# Patient Record
Sex: Male | Born: 1965 | Race: Black or African American | Hispanic: No | Smoking: Former smoker
Health system: Southern US, Community
[De-identification: ages and names within clinical notes are randomized; demographics above are authoritative.]

## PROBLEM LIST (undated history)

## (undated) DIAGNOSIS — J449 Chronic obstructive pulmonary disease, unspecified: Secondary | ICD-10-CM

## (undated) DIAGNOSIS — R569 Unspecified convulsions: Secondary | ICD-10-CM

## (undated) DIAGNOSIS — J189 Pneumonia, unspecified organism: Secondary | ICD-10-CM

## (undated) DIAGNOSIS — K859 Acute pancreatitis without necrosis or infection, unspecified: Secondary | ICD-10-CM

## (undated) DIAGNOSIS — F1290 Cannabis use, unspecified, uncomplicated: Secondary | ICD-10-CM

## (undated) DIAGNOSIS — K861 Other chronic pancreatitis: Secondary | ICD-10-CM

## (undated) DIAGNOSIS — H44002 Unspecified purulent endophthalmitis, left eye: Secondary | ICD-10-CM

## (undated) DIAGNOSIS — F1490 Cocaine use, unspecified, uncomplicated: Secondary | ICD-10-CM

## (undated) DIAGNOSIS — I509 Heart failure, unspecified: Secondary | ICD-10-CM

## (undated) DIAGNOSIS — J849 Interstitial pulmonary disease, unspecified: Secondary | ICD-10-CM

## (undated) DIAGNOSIS — E785 Hyperlipidemia, unspecified: Secondary | ICD-10-CM

## (undated) DIAGNOSIS — K219 Gastro-esophageal reflux disease without esophagitis: Secondary | ICD-10-CM

## (undated) DIAGNOSIS — F4310 Post-traumatic stress disorder, unspecified: Secondary | ICD-10-CM

## (undated) DIAGNOSIS — F17210 Nicotine dependence, cigarettes, uncomplicated: Secondary | ICD-10-CM

## (undated) DIAGNOSIS — N183 Chronic kidney disease, stage 3 unspecified: Secondary | ICD-10-CM

## (undated) DIAGNOSIS — R0600 Dyspnea, unspecified: Secondary | ICD-10-CM

## (undated) DIAGNOSIS — B9789 Other viral agents as the cause of diseases classified elsewhere: Secondary | ICD-10-CM

## (undated) DIAGNOSIS — E119 Type 2 diabetes mellitus without complications: Secondary | ICD-10-CM

## (undated) DIAGNOSIS — G8929 Other chronic pain: Secondary | ICD-10-CM

## (undated) DIAGNOSIS — I10 Essential (primary) hypertension: Secondary | ICD-10-CM

## (undated) DIAGNOSIS — C3492 Malignant neoplasm of unspecified part of left bronchus or lung: Secondary | ICD-10-CM

## (undated) DIAGNOSIS — F1090 Alcohol use, unspecified, uncomplicated: Secondary | ICD-10-CM

## (undated) DIAGNOSIS — M1990 Unspecified osteoarthritis, unspecified site: Secondary | ICD-10-CM

## (undated) DIAGNOSIS — M25559 Pain in unspecified hip: Secondary | ICD-10-CM

## (undated) DIAGNOSIS — N529 Male erectile dysfunction, unspecified: Secondary | ICD-10-CM

## (undated) DIAGNOSIS — I700 Atherosclerosis of aorta: Secondary | ICD-10-CM

## (undated) DIAGNOSIS — F32A Depression, unspecified: Secondary | ICD-10-CM

## (undated) HISTORY — PX: OTHER SURGICAL HISTORY: SHX169

## (undated) HISTORY — DX: Post-traumatic stress disorder, unspecified: F43.10

## (undated) HISTORY — DX: Chronic kidney disease, stage 3 (moderate): N18.3

## (undated) HISTORY — DX: Hyperlipidemia, unspecified: E78.5

## (undated) HISTORY — DX: Unspecified purulent endophthalmitis, left eye: H44.002

## (undated) HISTORY — DX: Chronic kidney disease, stage 3 unspecified: N18.30

## (undated) HISTORY — DX: Gastro-esophageal reflux disease without esophagitis: K21.9

## (undated) HISTORY — DX: Unspecified osteoarthritis, unspecified site: M19.90

## (undated) HISTORY — DX: Other viral agents as the cause of diseases classified elsewhere: B97.89

---

## 2007-07-21 ENCOUNTER — Emergency Department (HOSPITAL_COMMUNITY): Admission: EM | Admit: 2007-07-21 | Discharge: 2007-07-22 | Payer: Self-pay | Admitting: Emergency Medicine

## 2007-07-24 ENCOUNTER — Emergency Department (HOSPITAL_COMMUNITY): Admission: EM | Admit: 2007-07-24 | Discharge: 2007-07-24 | Payer: Self-pay | Admitting: Emergency Medicine

## 2009-06-12 DIAGNOSIS — F4310 Post-traumatic stress disorder, unspecified: Secondary | ICD-10-CM | POA: Insufficient documentation

## 2012-08-01 DIAGNOSIS — M1611 Unilateral primary osteoarthritis, right hip: Secondary | ICD-10-CM | POA: Insufficient documentation

## 2012-08-08 DIAGNOSIS — M87059 Idiopathic aseptic necrosis of unspecified femur: Secondary | ICD-10-CM | POA: Insufficient documentation

## 2012-11-29 DIAGNOSIS — K863 Pseudocyst of pancreas: Secondary | ICD-10-CM | POA: Insufficient documentation

## 2014-10-05 DIAGNOSIS — F1210 Cannabis abuse, uncomplicated: Secondary | ICD-10-CM | POA: Insufficient documentation

## 2014-10-05 DIAGNOSIS — F39 Unspecified mood [affective] disorder: Secondary | ICD-10-CM | POA: Insufficient documentation

## 2014-10-05 DIAGNOSIS — F17210 Nicotine dependence, cigarettes, uncomplicated: Secondary | ICD-10-CM | POA: Insufficient documentation

## 2014-11-23 DIAGNOSIS — Z9114 Patient's other noncompliance with medication regimen: Secondary | ICD-10-CM | POA: Insufficient documentation

## 2015-02-15 DIAGNOSIS — I152 Hypertension secondary to endocrine disorders: Secondary | ICD-10-CM | POA: Insufficient documentation

## 2015-02-15 DIAGNOSIS — I10 Essential (primary) hypertension: Secondary | ICD-10-CM | POA: Insufficient documentation

## 2015-03-04 DIAGNOSIS — F32A Depression, unspecified: Secondary | ICD-10-CM | POA: Insufficient documentation

## 2015-03-04 DIAGNOSIS — F329 Major depressive disorder, single episode, unspecified: Secondary | ICD-10-CM | POA: Insufficient documentation

## 2015-03-06 DIAGNOSIS — G8921 Chronic pain due to trauma: Secondary | ICD-10-CM | POA: Insufficient documentation

## 2015-08-28 DIAGNOSIS — F5105 Insomnia due to other mental disorder: Secondary | ICD-10-CM | POA: Insufficient documentation

## 2016-03-09 DIAGNOSIS — H0520 Unspecified exophthalmos: Secondary | ICD-10-CM | POA: Insufficient documentation

## 2016-03-09 DIAGNOSIS — D649 Anemia, unspecified: Secondary | ICD-10-CM | POA: Insufficient documentation

## 2016-04-29 ENCOUNTER — Inpatient Hospital Stay
Admission: EM | Admit: 2016-04-29 | Discharge: 2016-04-30 | DRG: 439 | Disposition: A | Discharge status: Home / Self Care | Payer: Medicaid Other | Attending: Specialist | Admitting: Specialist

## 2016-04-29 ENCOUNTER — Emergency Department: Payer: Medicaid Other

## 2016-04-29 ENCOUNTER — Encounter: Payer: Self-pay | Admitting: Emergency Medicine

## 2016-04-29 DIAGNOSIS — F17210 Nicotine dependence, cigarettes, uncomplicated: Secondary | ICD-10-CM | POA: Diagnosis present

## 2016-04-29 DIAGNOSIS — K852 Alcohol induced acute pancreatitis without necrosis or infection: Secondary | ICD-10-CM | POA: Diagnosis present

## 2016-04-29 DIAGNOSIS — I10 Essential (primary) hypertension: Secondary | ICD-10-CM | POA: Diagnosis present

## 2016-04-29 DIAGNOSIS — F1010 Alcohol abuse, uncomplicated: Secondary | ICD-10-CM | POA: Diagnosis present

## 2016-04-29 DIAGNOSIS — K219 Gastro-esophageal reflux disease without esophagitis: Secondary | ICD-10-CM | POA: Diagnosis present

## 2016-04-29 DIAGNOSIS — E860 Dehydration: Secondary | ICD-10-CM | POA: Diagnosis present

## 2016-04-29 DIAGNOSIS — K861 Other chronic pancreatitis: Secondary | ICD-10-CM | POA: Diagnosis present

## 2016-04-29 DIAGNOSIS — K859 Acute pancreatitis without necrosis or infection, unspecified: Secondary | ICD-10-CM | POA: Diagnosis present

## 2016-04-29 DIAGNOSIS — F439 Reaction to severe stress, unspecified: Secondary | ICD-10-CM | POA: Diagnosis present

## 2016-04-29 DIAGNOSIS — Z9889 Other specified postprocedural states: Secondary | ICD-10-CM

## 2016-04-29 DIAGNOSIS — F329 Major depressive disorder, single episode, unspecified: Secondary | ICD-10-CM | POA: Diagnosis present

## 2016-04-29 DIAGNOSIS — Z888 Allergy status to other drugs, medicaments and biological substances status: Secondary | ICD-10-CM

## 2016-04-29 DIAGNOSIS — Z833 Family history of diabetes mellitus: Secondary | ICD-10-CM

## 2016-04-29 DIAGNOSIS — E871 Hypo-osmolality and hyponatremia: Secondary | ICD-10-CM | POA: Diagnosis present

## 2016-04-29 HISTORY — DX: Other chronic pain: G89.29

## 2016-04-29 HISTORY — DX: Other chronic pancreatitis: K86.1

## 2016-04-29 HISTORY — DX: Essential (primary) hypertension: I10

## 2016-04-29 HISTORY — DX: Acute pancreatitis without necrosis or infection, unspecified: K85.90

## 2016-04-29 HISTORY — DX: Pain in unspecified hip: M25.559

## 2016-04-29 LAB — URINALYSIS COMPLETE WITH MICROSCOPIC (ARMC ONLY)
BACTERIA UA: NONE SEEN
BILIRUBIN URINE: NEGATIVE
GLUCOSE, UA: 50 mg/dL — AB
Hgb urine dipstick: NEGATIVE
LEUKOCYTES UA: NEGATIVE
NITRITE: NEGATIVE
Protein, ur: 30 mg/dL — AB
SPECIFIC GRAVITY, URINE: 1.014 (ref 1.005–1.030)
pH: 7 (ref 5.0–8.0)

## 2016-04-29 LAB — COMPREHENSIVE METABOLIC PANEL
ALT: 15 U/L — ABNORMAL LOW (ref 17–63)
ANION GAP: 7 (ref 5–15)
AST: 20 U/L (ref 15–41)
Albumin: 3.6 g/dL (ref 3.5–5.0)
Alkaline Phosphatase: 81 U/L (ref 38–126)
BILIRUBIN TOTAL: 0.2 mg/dL — AB (ref 0.3–1.2)
BUN: 13 mg/dL (ref 6–20)
CHLORIDE: 101 mmol/L (ref 101–111)
CO2: 23 mmol/L (ref 22–32)
Calcium: 8.8 mg/dL — ABNORMAL LOW (ref 8.9–10.3)
Creatinine, Ser: 1.1 mg/dL (ref 0.61–1.24)
GFR calc Af Amer: >60 mL/min (ref >60)
Glucose, Bld: 151 mg/dL — ABNORMAL HIGH (ref 65–99)
POTASSIUM: 3.6 mmol/L (ref 3.5–5.1)
Sodium: 131 mmol/L — ABNORMAL LOW (ref 135–145)
TOTAL PROTEIN: 10.4 g/dL — AB (ref 6.5–8.1)

## 2016-04-29 LAB — CBC
HEMATOCRIT: 42.2 % (ref 40.0–52.0)
Hemoglobin: 14.9 g/dL (ref 13.0–18.0)
MCH: 32.4 pg (ref 26.0–34.0)
MCHC: 35.4 g/dL (ref 32.0–36.0)
MCV: 91.4 fL (ref 80.0–100.0)
Platelets: 216 10*3/uL (ref 150–440)
RBC: 4.62 MIL/uL (ref 4.40–5.90)
RDW: 14.1 % (ref 11.5–14.5)
WBC: 16.3 10*3/uL — ABNORMAL HIGH (ref 3.8–10.6)

## 2016-04-29 LAB — LIPASE, BLOOD: LIPASE: 240 U/L — AB (ref 11–51)

## 2016-04-29 LAB — TROPONIN I

## 2016-04-29 MED ORDER — AMLODIPINE BESYLATE 10 MG PO TABS
10.0000 mg | ORAL_TABLET | Freq: Every day | ORAL | Status: DC
  Administered 2016-04-29 – 2016-04-30 (×2): 10 mg via ORAL
  Filled 2016-04-29 (×2): qty 1

## 2016-04-29 MED ORDER — FERROUS SULFATE 325 (65 FE) MG PO TABS
325.0000 mg | ORAL_TABLET | Freq: Every day | ORAL | Status: DC
  Administered 2016-04-30: 325 mg via ORAL
  Filled 2016-04-29: qty 1

## 2016-04-29 MED ORDER — BUPROPION HCL ER (XL) 150 MG PO TB24
150.0000 mg | ORAL_TABLET | Freq: Every day | ORAL | Status: DC
  Administered 2016-04-29 – 2016-04-30 (×2): 150 mg via ORAL
  Filled 2016-04-29 (×2): qty 1

## 2016-04-29 MED ORDER — LORATADINE 10 MG PO TABS
10.0000 mg | ORAL_TABLET | Freq: Every day | ORAL | Status: DC
  Administered 2016-04-29 – 2016-04-30 (×2): 10 mg via ORAL
  Filled 2016-04-29 (×2): qty 1

## 2016-04-29 MED ORDER — ACETAMINOPHEN 325 MG PO TABS: 650.0000 mg | ORAL_TABLET | Freq: Four times a day (QID) | ORAL | Status: DC | PRN

## 2016-04-29 MED ORDER — BISACODYL 5 MG PO TBEC
5.0000 mg | DELAYED_RELEASE_TABLET | Freq: Every day | ORAL | Status: DC | PRN
  Filled 2016-04-29: qty 1

## 2016-04-29 MED ORDER — HYDROMORPHONE HCL 1 MG/ML IJ SOLN
1.0000 mg | Freq: Once | INTRAMUSCULAR | Status: AC
  Administered 2016-04-29: 1 mg via INTRAVENOUS

## 2016-04-29 MED ORDER — ONDANSETRON HCL 4 MG/2ML IJ SOLN
INTRAMUSCULAR | Status: AC
  Administered 2016-04-29: 4 mg via INTRAVENOUS
  Filled 2016-04-29: qty 2

## 2016-04-29 MED ORDER — HYDRALAZINE HCL 20 MG/ML IJ SOLN: 10.0000 mg | Freq: Four times a day (QID) | INTRAMUSCULAR | Status: DC | PRN

## 2016-04-29 MED ORDER — SODIUM CHLORIDE 0.9 % IV BOLUS (SEPSIS)
1000.0000 mL | Freq: Once | INTRAVENOUS | Status: AC
  Administered 2016-04-29: 1000 mL via INTRAVENOUS

## 2016-04-29 MED ORDER — ONDANSETRON HCL 4 MG/2ML IJ SOLN: 4.0000 mg | Freq: Four times a day (QID) | INTRAMUSCULAR | Status: DC | PRN

## 2016-04-29 MED ORDER — HYDROMORPHONE HCL 1 MG/ML IJ SOLN
INTRAMUSCULAR | Status: AC
  Administered 2016-04-29: 1 mg via INTRAVENOUS
  Filled 2016-04-29: qty 1

## 2016-04-29 MED ORDER — POTASSIUM CHLORIDE IN NACL 20-0.9 MEQ/L-% IV SOLN
INTRAVENOUS | Status: DC
  Administered 2016-04-29 – 2016-04-30 (×3): via INTRAVENOUS
  Filled 2016-04-29 (×6): qty 1000

## 2016-04-29 MED ORDER — FAMOTIDINE 20 MG PO TABS
20.0000 mg | ORAL_TABLET | Freq: Every day | ORAL | Status: DC
  Administered 2016-04-29 – 2016-04-30 (×2): 20 mg via ORAL
  Filled 2016-04-29 (×2): qty 1

## 2016-04-29 MED ORDER — ALBUTEROL SULFATE (2.5 MG/3ML) 0.083% IN NEBU: 2.5000 mg | INHALATION_SOLUTION | RESPIRATORY_TRACT | Status: DC | PRN

## 2016-04-29 MED ORDER — LOSARTAN POTASSIUM 50 MG PO TABS
100.0000 mg | ORAL_TABLET | Freq: Every day | ORAL | Status: DC
  Administered 2016-04-29 – 2016-04-30 (×2): 100 mg via ORAL
  Filled 2016-04-29 (×2): qty 2

## 2016-04-29 MED ORDER — ONDANSETRON HCL 4 MG PO TABS: 4.0000 mg | ORAL_TABLET | Freq: Four times a day (QID) | ORAL | Status: DC | PRN

## 2016-04-29 MED ORDER — IOPAMIDOL (ISOVUE-300) INJECTION 61%
100.0000 mL | Freq: Once | INTRAVENOUS | Status: AC | PRN
  Administered 2016-04-29: 100 mL via INTRAVENOUS

## 2016-04-29 MED ORDER — MORPHINE SULFATE (PF) 4 MG/ML IV SOLN: 4.0000 mg | INTRAVENOUS | Status: DC | PRN

## 2016-04-29 MED ORDER — ONDANSETRON HCL 4 MG/2ML IJ SOLN
4.0000 mg | Freq: Once | INTRAMUSCULAR | Status: AC
Start: 2016-04-29 — End: 2016-04-29
  Administered 2016-04-29: 4 mg via INTRAVENOUS

## 2016-04-29 MED ORDER — MORPHINE SULFATE (PF) 4 MG/ML IV SOLN
INTRAVENOUS | Status: AC
  Administered 2016-04-29: 4 mg via INTRAVENOUS
  Filled 2016-04-29: qty 1

## 2016-04-29 MED ORDER — POLYETHYLENE GLYCOL 3350 17 G PO PACK: 17.0000 g | PACK | Freq: Every day | ORAL | Status: DC | PRN

## 2016-04-29 MED ORDER — HYDROCODONE-ACETAMINOPHEN 5-325 MG PO TABS
1.0000 | ORAL_TABLET | ORAL | Status: DC | PRN
  Administered 2016-04-29: 2 via ORAL
  Administered 2016-04-29: 1 via ORAL
  Administered 2016-04-30 (×2): 2 via ORAL
  Filled 2016-04-29 (×2): qty 2
  Filled 2016-04-29: qty 1
  Filled 2016-04-29: qty 2

## 2016-04-29 MED ORDER — ENOXAPARIN SODIUM 40 MG/0.4ML ~~LOC~~ SOLN
40.0000 mg | SUBCUTANEOUS | Status: DC
  Administered 2016-04-29 – 2016-04-30 (×2): 40 mg via SUBCUTANEOUS
  Filled 2016-04-29 (×2): qty 0.4

## 2016-04-29 MED ORDER — MORPHINE SULFATE (PF) 4 MG/ML IV SOLN
4.0000 mg | Freq: Once | INTRAVENOUS | Status: AC
Start: 2016-04-29 — End: 2016-04-29
  Administered 2016-04-29: 4 mg via INTRAVENOUS

## 2016-04-29 MED ORDER — IOPAMIDOL (ISOVUE-300) INJECTION 61%
30.0000 mL | Freq: Once | INTRAVENOUS | Status: AC | PRN
  Administered 2016-04-29: 30 mL via ORAL

## 2016-04-29 MED ORDER — ACETAMINOPHEN 650 MG RE SUPP: 650.0000 mg | Freq: Four times a day (QID) | RECTAL | Status: DC | PRN

## 2016-04-29 NOTE — ED Triage Notes (Signed)
Pt to ED via EMS with LLQ abdominal pain that started 2 days ago, but got worse last night, accompanied by nausea and vomiting, approx 6 times in the last 24hrs. Pt reports history of pancreatitis with stent placement in past.

## 2016-04-29 NOTE — ED Provider Notes (Signed)
[facility] Emergency Department Provider Note  Time seen: 7:21 AM  I have reviewed the triage vital signs and the nursing notes.   HISTORY  Chief Complaint Abdominal Pain; Nausea; and Emesis    HPI Noah Velasquez is a 50 y.o. male with a past medical history of alcohol abuse, pancreatitis, hypertension, who presents the emergency department with left-sided abdominal pain for the past 2-3 days. According to the patient for the past several days he has developed progressively worsening left-sided abdominal pain. He states loose stool for the same time period, nausea with vomiting this morning. Denies fever. Patient has a history of alcohol-induced pancreatitis, states he has not been drinking for quite a while however restarted 2 weeks ago daily drinking. His last drink was last night. Denies dysuria.  Past Medical History:  Diagnosis Date  . Chronic hip pain   . Hypertension   . Pancreatitis     There are no active problems to display for this patient.   Past Surgical History:  Procedure Laterality Date  . pancreatic stent placement      Prior to Admission medications   Not on File    Allergies  Allergen Reactions  . Hctz [Hydrochlorothiazide] Swelling    No family history on file.  Social History Social History  Substance Use Topics  . Smoking status: Current Every Day Smoker    Packs/day: 0.50    Types: Cigarettes  . Smokeless tobacco: Never Used  . Alcohol use Yes     Comment: usually none, but started back drinking 12 pack last week    Review of Systems Constitutional: Negative for fever Cardiovascular: Negative for chest pain. Respiratory: Negative for shortness of breath. Gastrointestinal: Left-sided abdominal pain. Genitourinary: Negative for dysuria. Musculoskeletal: Negative for back pain Neurological: Negative for headache 10-point ROS otherwise negative.  ____________________________________________   PHYSICAL  EXAM:  VITAL SIGNS: ED Triage Vitals  Enc Vitals Group     BP 04/29/16 0712 (!) 179/109     Pulse Rate 04/29/16 0712 100     Resp 04/29/16 0712 (!) 22     Temp 04/29/16 0712 97.8 F (36.6 C)     Temp Source 04/29/16 0712 Oral     SpO2 04/29/16 0708 96 %     Weight 04/29/16 0713 184 lb (83.5 kg)     Height 04/29/16 0713 5\' 8"  (1.727 m)     Head Circumference --      Peak Flow --      Pain Score 04/29/16 0713 10     Pain Loc --      Pain Edu? --      Excl. in [place]? --     Constitutional: Alert and oriented. Well appearing and in no distress. Eyes: Normal exam ENT   Head: Normocephalic and atraumatic   Mouth/Throat: Mucous membranes are moist. Cardiovascular: Normal rate, regular rhythm. No murmur Respiratory: Normal respiratory effort without tachypnea nor retractions. Breath sounds are clear Gastrointestinal: Soft, moderate epigastric left-sided and left lower quadrant abdominal tenderness to palpation without rebound or guarding. Musculoskeletal: Nontender with normal range of motion in all extremities.  Neurologic:  Normal speech and language. No gross focal neurologic deficits Skin:  Skin is warm, dry and intact.  Psychiatric: Mood and affect are normal.   ____________________________________________    EKG  EKG reviewed and interpreted by myself shows normal sinus rhythm at 100 bpm, narrow QRS, normal axis, normal intervals nonspecific ST changes with slight lateral ST depression, no ST elevation.  ____________________________________________    RADIOLOGY  CT consistent with acute pancreatitis  ____________________________________________   INITIAL IMPRESSION / ASSESSMENT AND PLAN / ED COURSE  Pertinent labs & imaging results that were available during my care of the patient were reviewed by me and considered in my medical decision making (see chart for details).  The patient presents with left-sided abdominal discomfort for the past 3 days. Positive  for nausea, vomiting this morning, loose stool. Moderate tenderness to palpation. We will check labs, treat discomfort and obtain a CT scan. Suspicion for pancreatitis versus diverticulitis versus colitis.  Patient's labs are resulted showing an elevated lipase of 240, likely the cause of the patient's abdominal discomfort. CT scan pending.  CT consistent with acute pancreatitis. Patient continues to have significant pain with moderate tachycardia around 110 bpm. We will admit to the hospitalist service for further treatment. ____________________________________________   FINAL CLINICAL IMPRESSION(S) / ED DIAGNOSES  Left abdominal pain Alcohol-induced pancreatitis    Harvest Dark, MD 04/29/16 1115

## 2016-04-29 NOTE — H&P (Signed)
[facility] [place] NAME: Noah Velasquez    MR#:  [id number]  DATE OF BIRTH:  1966/07/09  DATE OF ADMISSION:  04/29/2016  PRIMARY CARE PHYSICIAN: No PCP Per Patient   REQUESTING/REFERRING PHYSICIAN: Dr. Kerman Passey  CHIEF COMPLAINT:   Chief Complaint  Patient presents with  . Abdominal Pain  . Nausea  . Emesis    HISTORY OF PRESENT ILLNESS:  Noah Velasquez  is a 50 y.o. male with a known history of Hypertension, alcohol abuse, chronic pancreatitis, drained pseudocyst presents to the emergency room complaining of 2 days of nausea, vomiting and upper abdominal pain. Patient felt his symptoms were very similar to his prior pancreatitis episode. Here his lipase is elevated at 250 and CT scan showing chronic pancreatitis along with acute changes. Patient has no hematemesis or melena. Afebrile. Liver enzymes are normal. No gallstones seen on CT scan.  Patient has restarted drinking alcohol 2 weeks back.  PAST MEDICAL HISTORY:   Past Medical History:  Diagnosis Date  . Chronic hip pain   . Chronic pancreatitis ([place])   . Hypertension   . Pancreatitis     PAST SURGICAL HISTORY:   Past Surgical History:  Procedure Laterality Date  . pancreatic stent placement      SOCIAL HISTORY:   Social History  Substance Use Topics  . Smoking status: Current Every Day Smoker    Packs/day: 0.50    Types: Cigarettes  . Smokeless tobacco: Never Used  . Alcohol use Yes     Comment: usually none, but started back drinking 12 pack last week    FAMILY HISTORY:   Family History  Problem Relation Age of Onset  . Diabetes Mother     DRUG ALLERGIES:   Allergies  Allergen Reactions  . Hctz [Hydrochlorothiazide] Swelling    REVIEW OF SYSTEMS:   Review of Systems  Constitutional: Negative for chills, fever and weight loss.  HENT: Negative for hearing loss and nosebleeds.   Eyes: Negative for blurred vision, double vision and pain.   Respiratory: Negative for cough, hemoptysis, sputum production, shortness of breath and wheezing.   Cardiovascular: Negative for chest pain, palpitations, orthopnea and leg swelling.  Gastrointestinal: Positive for abdominal pain, nausea and vomiting. Negative for constipation and diarrhea.  Genitourinary: Negative for dysuria and hematuria.  Musculoskeletal: Negative for back pain, falls and myalgias.  Skin: Negative for rash.  Neurological: Negative for dizziness, tremors, sensory change, speech change, focal weakness, seizures and headaches.  Endo/Heme/Allergies: Does not bruise/bleed easily.  Psychiatric/Behavioral: Negative for depression and memory loss. The patient is not nervous/anxious.     MEDICATIONS AT HOME:   Prior to Admission medications   Not on File     VITAL SIGNS:  Blood pressure (!) 169/99, pulse (!) 109, temperature 97.8 F (36.6 C), temperature source Oral, resp. rate 13, height 5\' 8"  (1.727 m), weight 83.5 kg (184 lb), SpO2 97 %.  PHYSICAL EXAMINATION:  Physical Exam  GENERAL:  50 y.o.-year-old patient lying in the bed with no acute distress.  EYES: Pupils equal, round, reactive to light and accommodation. No scleral icterus. Extraocular muscles intact. Exophthalmus HEENT: Head atraumatic, normocephalic. Oropharynx and nasopharynx clear. No oropharyngeal erythema, dry oral mucosa  NECK:  Supple, no jugular venous distention. No thyroid enlargement, no tenderness.  LUNGS: Normal breath sounds bilaterally, no wheezing, rales, rhonchi. No use of accessory muscles of respiration.  CARDIOVASCULAR: S1, S2 normal. No murmurs, rubs, or gallops.  ABDOMEN: Soft, diffuse tenderness  with more in the left upper quadrant area, nondistended. Bowel sounds present. No organomegaly or mass.  EXTREMITIES: No pedal edema, cyanosis, or clubbing. + 2 pedal & radial pulses b/l.   NEUROLOGIC: Cranial nerves II through XII are intact. No focal Motor or sensory deficits appreciated  b/l PSYCHIATRIC: The patient is alert and oriented x 3. Good affect.  SKIN: No obvious rash, lesion, or ulcer.   LABORATORY PANEL:   CBC  Recent Labs Lab 04/29/16 0727  WBC 16.3*  HGB 14.9  HCT 42.2  PLT 216   ------------------------------------------------------------------------------------------------------------------  Chemistries   Recent Labs Lab 04/29/16 0727  NA 131*  K 3.6  CL 101  CO2 23  GLUCOSE 151*  BUN 13  CREATININE 1.10  CALCIUM 8.8*  AST 20  ALT 15*  ALKPHOS 81  BILITOT 0.2*   ------------------------------------------------------------------------------------------------------------------  Cardiac Enzymes  Recent Labs Lab 04/29/16 0727  TROPONINI <0.03   ------------------------------------------------------------------------------------------------------------------  RADIOLOGY:  Ct Abdomen Pelvis W Contrast  Result Date: 04/29/2016 CLINICAL DATA:  HX pancreatitis with pancreatic stent placed. No ca hx or surg of a/p. C/O left side abd pain with n/v x 2 days. EXAM: CT ABDOMEN AND PELVIS WITH CONTRAST TECHNIQUE: Multidetector CT imaging of the abdomen and pelvis was performed using the standard protocol following bolus administration of intravenous contrast. CONTRAST:  150mL ISOVUE-300 IOPAMIDOL (ISOVUE-300) INJECTION 61% COMPARISON:  None. FINDINGS: Lower chest: Patchy and reticular type opacity is noted in the posterior lung bases, in the lower lobes, right greater than left, which may all reflect atelectasis. Pneumonia or aspiration pneumonitis on the right is possible. Heart is normal in size. Hepatobiliary: Unremarkable liver. No gallstones or gallbladder wall thickening. No pericholecystic inflammation. No bile duct dilation. Pancreas: There are dystrophic appearing pancreatic calcifications most evident along the body and tail. There is a cystic area within the pancreatic tail that measures 20 x 9 x 11 mm. Subtle haziness is seen in the  peripancreatic fat adjacent to the tail which may be chronic or reflect mild acute inflammation. No peripancreatic fluid collections are seen. There is no evidence of pancreatic necrosis. Spleen: Unremarkable Adrenals/Urinary Tract: Abnormal appearance of both kidneys. There are multiple areas of relative decreased enhancement in both kidneys. There are no renal masses or stones. There is no hydronephrosis. There is no perinephric inflammatory change or fluid collections. The ureters are normal in course and in caliber. Bladder is unremarkable. 1 cm nodule arises from the left adrenal gland. This shows 41% relative washout between the initial and delayed postcontrast sequences, strongly indicative of an adrenal adenoma. Normal right adrenal gland. Stomach/Bowel: Stomach, small bowel and colon are unremarkable. Normal appendix is visualized. Vascular/Lymphatic: Dense atherosclerotic calcifications are noted along the abdominal aorta and its branch vessels. There is abdominal aortic ectasia, maximum AP diameter of 2.3 cm. There are prominent gastrohepatic ligament lymph nodes, largest measuring 1 cm short axis. There are scattered sub cm retroperitoneal lymph nodes. Mildly enlarged inguinal lymph nodes are noted, largest is on the right measuring 12 mm in short axis. Reproductive: Prostate is unremarkable. Other: Small umbilical hernia. Musculoskeletal: Mild degenerative changes noted of the visualized spine. No osteoblastic or osteolytic lesions. IMPRESSION: 1. There is hazy opacity in the peripancreatic fat adjacent to the pancreatic tail, which may reflect mild, uncomplicated, active pancreatitis or be a chronic finding. Pancreas shows multiple dystrophic calcifications consistent with chronic pancreatitis. A cyst, which is likely from dilation of the distal duct, arises in the pancreatic tail measuring 2 cm in greatest dimension.  2. The kidneys also have abnormal an abnormal appearance with multiple areas of  relative decreased enhancement bilaterally, but no discrete mass and no perinephric inflammation. Findings may reflect bilateral pyelonephritis. Findings could be vascular in origin. The renal arteries, however, appear widely patent. 3. Lower lobe lung base opacity, right greater than left, which may all be atelectasis. Consider pneumonia or aspiration pneumonitis if there are consistent clinical findings. 4. No other evidence of an acute abnormality. 5. Mild inguinal adenopathy, most likely reactive. 6. Aortic atherosclerosis and mild ectasia. Electronically Signed   By: Lajean Manes M.D.   On: 04/29/2016 09:45     IMPRESSION AND PLAN:   * Acute pancreatitis with history of pseudocyst and chronic pancreatitis - alcohol-induced Nothing by mouth except ice chips and medications Add oral and IV pain medications along with nausea medications Start clear liquids tomorrow if improved. Counseled to quit alcohol. IV fluids  * Uncontrolled hypertension He has not taken his medications today. Also contributing by pain. Restart home medications. Add IV hydralazine as needed.  * Mild hyponatremia likely from dehydration Monitor with IV fluids  * Leukocytosis likely from hemoconcentration and stress reaction Afebrile. No antibiotics. Repeat labs in the morning after hydration.  * DVT prophylaxis with Lovenox  All the records are reviewed and case discussed with ED provider. Management plans discussed with the patient, family and they are in agreement.  CODE STATUS: Full code  TOTAL TIME TAKING CARE OF THIS PATIENT: 45 minutes.   Hillary Bow R M.D on 04/29/2016 at 11:42 AM  Between 7am to 6pm - Pager - [phone]  After 6pm go to [email or web address] - password [email or web address] [facility]  [place] [facility]  Office  [phone]  CC: Primary care physician; No PCP Per Patient  Note: This dictation was prepared with Dragon dictation along with smaller phrase technology. Any transcriptional  errors that result from this process are unintentional.

## 2016-04-30 LAB — CBC
HEMATOCRIT: 38.5 % — AB (ref 40.0–52.0)
HEMOGLOBIN: 13.2 g/dL (ref 13.0–18.0)
MCH: 32.6 pg (ref 26.0–34.0)
MCHC: 34.4 g/dL (ref 32.0–36.0)
MCV: 94.7 fL (ref 80.0–100.0)
Platelets: 174 10*3/uL (ref 150–440)
RBC: 4.06 MIL/uL — ABNORMAL LOW (ref 4.40–5.90)
RDW: 13.9 % (ref 11.5–14.5)
WBC: 11.5 10*3/uL — ABNORMAL HIGH (ref 3.8–10.6)

## 2016-04-30 LAB — COMPREHENSIVE METABOLIC PANEL
ALBUMIN: 3.1 g/dL — AB (ref 3.5–5.0)
ALT: 13 U/L — ABNORMAL LOW (ref 17–63)
ANION GAP: 6 (ref 5–15)
AST: 20 U/L (ref 15–41)
Alkaline Phosphatase: 63 U/L (ref 38–126)
BUN: 11 mg/dL (ref 6–20)
CHLORIDE: 104 mmol/L (ref 101–111)
CO2: 24 mmol/L (ref 22–32)
Calcium: 8.5 mg/dL — ABNORMAL LOW (ref 8.9–10.3)
Creatinine, Ser: 1.14 mg/dL (ref 0.61–1.24)
GFR calc Af Amer: >60 mL/min (ref >60)
GFR calc non Af Amer: >60 mL/min (ref >60)
GLUCOSE: 75 mg/dL (ref 65–99)
Potassium: 3.8 mmol/L (ref 3.5–5.1)
SODIUM: 134 mmol/L — AB (ref 135–145)
Total Bilirubin: 0.7 mg/dL (ref 0.3–1.2)
Total Protein: 9.2 g/dL — ABNORMAL HIGH (ref 6.5–8.1)

## 2016-04-30 LAB — HEMOGLOBIN A1C
HEMOGLOBIN A1C: 6.5 % — AB (ref 4.8–5.6)
Mean Plasma Glucose: 140 mg/dL

## 2016-04-30 MED ORDER — SERTRALINE HCL 100 MG PO TABS: 100.0000 mg | ORAL_TABLET | Freq: Every day | ORAL | Status: DC

## 2016-04-30 MED ORDER — PANCRELIPASE (LIP-PROT-AMYL) 12000-38000 UNITS PO CPEP: 12000.0000 [IU] | ORAL_CAPSULE | Freq: Three times a day (TID) | ORAL | Status: DC

## 2016-04-30 MED ORDER — INFLUENZA VAC SPLIT QUAD 0.5 ML IM SUSY
0.5000 mL | PREFILLED_SYRINGE | Freq: Once | INTRAMUSCULAR | Status: AC
  Administered 2016-04-30: 0.5 mL via INTRAMUSCULAR
  Filled 2016-04-30: qty 0.5

## 2016-04-30 NOTE — Discharge Summary (Signed)
[facility] NAME: Noah Velasquez    MR#:  [id number]  DATE OF BIRTH:  Sep 26, 1965  DATE OF ADMISSION:  04/29/2016 ADMITTING PHYSICIAN: Hillary Bow, MD  DATE OF DISCHARGE: 04/30/2016  PRIMARY CARE PHYSICIAN: No PCP Per Patient    ADMISSION DIAGNOSIS:  Alcohol-induced acute pancreatitis, unspecified complication status [id number]  DISCHARGE DIAGNOSIS:  Active Problems:   Pancreatitis   SECONDARY DIAGNOSIS:   Past Medical History:  Diagnosis Date  . Chronic hip pain   . Chronic pancreatitis ([place])   . Hypertension   . Pancreatitis     HOSPITAL COURSE:   50 year old male with past medical history of chronic pancreatitis, hypertension, GERD, who presented to the hospital due to abdominal pain nausea vomiting.  1. Abdominal pain nausea vomiting-this was secondary to a mild flareup of his chronic pancreatitis. -His CT abdomen pelvis did not show any acute inflammation but did show a small pseudocyst. -Patient was treated supportively with IV fluids, pain control, antiemetics. He has clinically improved. His diet was slowly advanced from a clear liquid eventually to a low-fat diet which she is tolerating without any further abdominal pain nausea vomiting. -His flareup was probably secondary to his alcohol abuse.  2. Essential hypertension-patient will resume his losartan, Norvasc.  3. Depression-patient will resume his Zoloft, trazodone, Wellbutrin.  4. Chronic pancreatitis-patient will resume his pancreatic lipase supplements.  DISCHARGE CONDITIONS:   Stable  CONSULTS OBTAINED:    DRUG ALLERGIES:   Allergies  Allergen Reactions  . Hctz [Hydrochlorothiazide] Swelling    DISCHARGE MEDICATIONS:     Medication List    TAKE these medications   amLODipine 10 MG tablet Commonly known as:  NORVASC Take 10 mg by mouth.   buPROPion 150 MG 12 hr tablet Commonly known as:  WELLBUTRIN SR Take 300 mg by mouth daily.    ferrous sulfate 325 (65 FE) MG tablet Take 325 mg by mouth daily.   loratadine 10 MG tablet Commonly known as:  CLARITIN Take 10 mg by mouth daily.   losartan 100 MG tablet Commonly known as:  COZAAR Take 100 mg by mouth.   ranitidine 150 MG tablet Commonly known as:  ZANTAC Take 150 mg by mouth 2 (two) times daily.   sertraline 100 MG tablet Commonly known as:  ZOLOFT Take 100 mg by mouth.   traZODone 150 MG tablet Commonly known as:  DESYREL Take 1 tablet by mouth at bedtime as needed for sleep.   ZENPEP 15000 units Cpep Generic drug:  Pancrelipase (Lip-Prot-Amyl) Take 1 capsule by mouth 3 (three) times daily. With meals         DISCHARGE INSTRUCTIONS:   DIET:  Cardiac diet  DISCHARGE CONDITION:  Stable  ACTIVITY:  Activity as tolerated  OXYGEN:  Home Oxygen: No.   Oxygen Delivery: room air  DISCHARGE LOCATION:  home   If you experience worsening of your admission symptoms, develop shortness of breath, life threatening emergency, suicidal or homicidal thoughts you must seek medical attention immediately by calling 911 or calling your MD immediately  if symptoms less severe.  You Must read complete instructions/literature along with all the possible adverse reactions/side effects for all the Medicines you take and that have been prescribed to you. Take any new Medicines after you have completely understood and accpet all the possible adverse reactions/side effects.   Please note  You were cared for by a hospitalist during your hospital stay. If you have any questions about your discharge  medications or the care you received while you were in the hospital after you are discharged, you can call the unit and asked to speak with the hospitalist on call if the hospitalist that took care of you is not available. Once you are discharged, your primary care physician will handle any further medical issues. Please note that NO REFILLS for any discharge medications  will be authorized once you are discharged, as it is imperative that you return to your primary care physician (or establish a relationship with a primary care physician if you do not have one) for your aftercare needs so that they can reassess your need for medications and monitor your lab values.     Today   Abdominal pain improved. No N/V and wants to try and eat.    VITAL SIGNS:  Blood pressure 135/87, pulse 72, temperature 98.4 F (36.9 C), temperature source Oral, resp. rate 12, height 5\' 8"  (1.727 m), weight 81.6 kg (180 lb), SpO2 100 %.  I/O:    Intake/Output Summary (Last 24 hours) at 04/30/16 1430 Last data filed at 04/30/16 1335  Gross per 24 hour  Intake           3319.5 ml  Output             1700 ml  Net           1619.5 ml    PHYSICAL EXAMINATION:  GENERAL:  50 y.o.-year-old patient lying in the bed with no acute distress.  EYES: Pupils equal, round, reactive to light and accommodation. No scleral icterus. Extraocular muscles intact.  HEENT: Head atraumatic, normocephalic. Oropharynx and nasopharynx clear.  NECK:  Supple, no jugular venous distention. No thyroid enlargement, no tenderness.  LUNGS: Normal breath sounds bilaterally, no wheezing, rales,rhonchi. No use of accessory muscles of respiration.  CARDIOVASCULAR: S1, S2 normal. No murmurs, rubs, or gallops.  ABDOMEN: Soft, mildly tender in epigastric, RLQ area, non-distended. Bowel sounds present. No organomegaly or mass.  EXTREMITIES: No pedal edema, cyanosis, or clubbing.  NEUROLOGIC: Cranial nerves II through XII are intact. No focal motor or sensory defecits b/l.  PSYCHIATRIC: The patient is alert and oriented x 3. Good affect.  SKIN: No obvious rash, lesion, or ulcer.   DATA REVIEW:   CBC  Recent Labs Lab 04/30/16 0506  WBC 11.5*  HGB 13.2  HCT 38.5*  PLT 174    Chemistries   Recent Labs Lab 04/30/16 0506  NA 134*  K 3.8  CL 104  CO2 24  GLUCOSE 75  BUN 11  CREATININE 1.14   CALCIUM 8.5*  AST 20  ALT 13*  ALKPHOS 63  BILITOT 0.7    Cardiac Enzymes  Recent Labs Lab 04/29/16 0727  TROPONINI <0.03    Microbiology Results  No results found for this or any previous visit.  RADIOLOGY:  Ct Abdomen Pelvis W Contrast  Result Date: 04/29/2016 CLINICAL DATA:  HX pancreatitis with pancreatic stent placed. No ca hx or surg of a/p. C/O left side abd pain with n/v x 2 days. EXAM: CT ABDOMEN AND PELVIS WITH CONTRAST TECHNIQUE: Multidetector CT imaging of the abdomen and pelvis was performed using the standard protocol following bolus administration of intravenous contrast. CONTRAST:  187mL ISOVUE-300 IOPAMIDOL (ISOVUE-300) INJECTION 61% COMPARISON:  None. FINDINGS: Lower chest: Patchy and reticular type opacity is noted in the posterior lung bases, in the lower lobes, right greater than left, which may all reflect atelectasis. Pneumonia or aspiration pneumonitis on the right is possible. Heart is  normal in size. Hepatobiliary: Unremarkable liver. No gallstones or gallbladder wall thickening. No pericholecystic inflammation. No bile duct dilation. Pancreas: There are dystrophic appearing pancreatic calcifications most evident along the body and tail. There is a cystic area within the pancreatic tail that measures 20 x 9 x 11 mm. Subtle haziness is seen in the peripancreatic fat adjacent to the tail which may be chronic or reflect mild acute inflammation. No peripancreatic fluid collections are seen. There is no evidence of pancreatic necrosis. Spleen: Unremarkable Adrenals/Urinary Tract: Abnormal appearance of both kidneys. There are multiple areas of relative decreased enhancement in both kidneys. There are no renal masses or stones. There is no hydronephrosis. There is no perinephric inflammatory change or fluid collections. The ureters are normal in course and in caliber. Bladder is unremarkable. 1 cm nodule arises from the left adrenal gland. This shows 41% relative washout  between the initial and delayed postcontrast sequences, strongly indicative of an adrenal adenoma. Normal right adrenal gland. Stomach/Bowel: Stomach, small bowel and colon are unremarkable. Normal appendix is visualized. Vascular/Lymphatic: Dense atherosclerotic calcifications are noted along the abdominal aorta and its branch vessels. There is abdominal aortic ectasia, maximum AP diameter of 2.3 cm. There are prominent gastrohepatic ligament lymph nodes, largest measuring 1 cm short axis. There are scattered sub cm retroperitoneal lymph nodes. Mildly enlarged inguinal lymph nodes are noted, largest is on the right measuring 12 mm in short axis. Reproductive: Prostate is unremarkable. Other: Small umbilical hernia. Musculoskeletal: Mild degenerative changes noted of the visualized spine. No osteoblastic or osteolytic lesions. IMPRESSION: 1. There is hazy opacity in the peripancreatic fat adjacent to the pancreatic tail, which may reflect mild, uncomplicated, active pancreatitis or be a chronic finding. Pancreas shows multiple dystrophic calcifications consistent with chronic pancreatitis. A cyst, which is likely from dilation of the distal duct, arises in the pancreatic tail measuring 2 cm in greatest dimension. 2. The kidneys also have abnormal an abnormal appearance with multiple areas of relative decreased enhancement bilaterally, but no discrete mass and no perinephric inflammation. Findings may reflect bilateral pyelonephritis. Findings could be vascular in origin. The renal arteries, however, appear widely patent. 3. Lower lobe lung base opacity, right greater than left, which may all be atelectasis. Consider pneumonia or aspiration pneumonitis if there are consistent clinical findings. 4. No other evidence of an acute abnormality. 5. Mild inguinal adenopathy, most likely reactive. 6. Aortic atherosclerosis and mild ectasia. Electronically Signed   By: Lajean Manes M.D.   On: 04/29/2016 09:45       Management plans discussed with the patient, family and they are in agreement.  CODE STATUS:     Code Status Orders        Start     Ordered   04/29/16 1122  Full code  Continuous     04/29/16 1122    Code Status History    Date Active Date Inactive Code Status Order ID Comments User Context   This patient has a current code status but no historical code status.      TOTAL TIME TAKING CARE OF THIS PATIENT: 40 minutes.    Henreitta Leber M.D on 04/30/2016 at 2:30 PM  Between 7am to 6pm - Pager - [phone]  After 6pm go to [email or web address] - [employer] [facility]  Office  [phone]  CC: Primary care physician; No PCP Per Patient

## 2016-04-30 NOTE — Progress Notes (Signed)
Patient A&O, VSS.  Medicated x1 for good relief.  Discharge instructions reviewed with patient.  All questions were answered and understanding was verbalized.  Patient discharged home via wheelchair in stable condition escorted by volunteer staff.

## 2016-04-30 NOTE — Discharge Instructions (Signed)
Acute Pancreatitis Acute pancreatitis is a disease in which the pancreas becomes suddenly irritated (inflamed). The pancreas is a large gland behind your stomach. The pancreas makes enzymes that help digest food. The pancreas also makes 2 hormones that help control your blood sugar. Acute pancreatitis happens when the enzymes attack and damage the pancreas. Most attacks last a couple of days and can cause serious problems. HOME CARE  Follow your doctor's diet instructions. You may need to avoid alcohol and limit fat in your diet.  Eat small meals often.  Drink enough fluids to keep your pee (urine) clear or pale yellow.  Only take medicines as told by your doctor.  Avoid drinking alcohol if it caused your disease.  Do not smoke.  Get plenty of rest.  Check your blood sugar at home as told by your doctor.  Keep all doctor visits as told. GET HELP IF:  You do not get better as quickly as expected.  You have new or worsening symptoms.  You have lasting pain, weakness, or feel sick to your stomach (nauseous).  You get better and then have another pain attack. GET HELP RIGHT AWAY IF:   You are unable to eat or keep fluids down.  Your pain becomes severe.  You have a fever or lasting symptoms for more than 2 to 3 days.  You have a fever and your symptoms suddenly get worse.  Your skin or the white part of your eyes turn yellow (jaundice).  You throw up (vomit).  You feel dizzy, or you pass out (faint).  Your blood sugar is high (over 300 mg/dL). MAKE SURE YOU:   Understand these instructions.  Will watch your condition.  Will get help right away if you are not doing well or get worse.   This information is not intended to replace advice given to you by your health care provider. Make sure you discuss any questions you have with your health care provider.   Document Released: 01/20/2008 Document Revised: 08/24/2014 Document Reviewed: 11/12/2011 Elsevier Interactive  Patient Education [employer].

## 2016-10-18 DIAGNOSIS — R590 Localized enlarged lymph nodes: Secondary | ICD-10-CM | POA: Insufficient documentation

## 2017-01-04 DIAGNOSIS — D472 Monoclonal gammopathy: Secondary | ICD-10-CM | POA: Insufficient documentation

## 2017-02-24 DIAGNOSIS — R590 Localized enlarged lymph nodes: Secondary | ICD-10-CM | POA: Diagnosis not present

## 2017-04-02 ENCOUNTER — Encounter: Payer: Self-pay | Admitting: Emergency Medicine

## 2017-04-02 ENCOUNTER — Inpatient Hospital Stay
Admission: EM | Admit: 2017-04-02 | Discharge: 2017-04-04 | DRG: 439 | Disposition: A | Discharge status: Home / Self Care | Payer: Medicaid Other | Attending: Internal Medicine | Admitting: Internal Medicine

## 2017-04-02 ENCOUNTER — Emergency Department: Payer: Medicaid Other

## 2017-04-02 DIAGNOSIS — F17210 Nicotine dependence, cigarettes, uncomplicated: Secondary | ICD-10-CM | POA: Diagnosis present

## 2017-04-02 DIAGNOSIS — N179 Acute kidney failure, unspecified: Secondary | ICD-10-CM | POA: Diagnosis present

## 2017-04-02 DIAGNOSIS — Z79899 Other long term (current) drug therapy: Secondary | ICD-10-CM

## 2017-04-02 DIAGNOSIS — M25559 Pain in unspecified hip: Secondary | ICD-10-CM | POA: Diagnosis present

## 2017-04-02 DIAGNOSIS — F419 Anxiety disorder, unspecified: Secondary | ICD-10-CM | POA: Diagnosis present

## 2017-04-02 DIAGNOSIS — G8929 Other chronic pain: Secondary | ICD-10-CM | POA: Diagnosis present

## 2017-04-02 DIAGNOSIS — E871 Hypo-osmolality and hyponatremia: Secondary | ICD-10-CM | POA: Diagnosis present

## 2017-04-02 DIAGNOSIS — Z888 Allergy status to other drugs, medicaments and biological substances status: Secondary | ICD-10-CM

## 2017-04-02 DIAGNOSIS — K852 Alcohol induced acute pancreatitis without necrosis or infection: Principal | ICD-10-CM | POA: Diagnosis present

## 2017-04-02 DIAGNOSIS — I10 Essential (primary) hypertension: Secondary | ICD-10-CM | POA: Diagnosis present

## 2017-04-02 DIAGNOSIS — E860 Dehydration: Secondary | ICD-10-CM | POA: Diagnosis present

## 2017-04-02 DIAGNOSIS — F329 Major depressive disorder, single episode, unspecified: Secondary | ICD-10-CM | POA: Diagnosis present

## 2017-04-02 DIAGNOSIS — E876 Hypokalemia: Secondary | ICD-10-CM | POA: Diagnosis present

## 2017-04-02 LAB — CBC
HEMATOCRIT: 44.6 % (ref 40.0–52.0)
Hemoglobin: 15.4 g/dL (ref 13.0–18.0)
MCH: 30.9 pg (ref 26.0–34.0)
MCHC: 34.5 g/dL (ref 32.0–36.0)
MCV: 89.6 fL (ref 80.0–100.0)
Platelets: 214 10*3/uL (ref 150–440)
RBC: 4.98 MIL/uL (ref 4.40–5.90)
RDW: 13.9 % (ref 11.5–14.5)
WBC: 15.3 10*3/uL — AB (ref 3.8–10.6)

## 2017-04-02 LAB — LIPASE, BLOOD: Lipase: 64 U/L — ABNORMAL HIGH (ref 11–51)

## 2017-04-02 LAB — COMPREHENSIVE METABOLIC PANEL
ALBUMIN: 3.7 g/dL (ref 3.5–5.0)
ALT: 22 U/L (ref 17–63)
AST: 54 U/L — AB (ref 15–41)
Alkaline Phosphatase: 121 U/L (ref 38–126)
Anion gap: 11 (ref 5–15)
BUN: 31 mg/dL — AB (ref 6–20)
CHLORIDE: 101 mmol/L (ref 101–111)
CO2: 21 mmol/L — ABNORMAL LOW (ref 22–32)
Calcium: 9.1 mg/dL (ref 8.9–10.3)
Creatinine, Ser: 2.02 mg/dL — ABNORMAL HIGH (ref 0.61–1.24)
GFR calc Af Amer: 42 mL/min — ABNORMAL LOW (ref >60)
GFR calc non Af Amer: 36 mL/min — ABNORMAL LOW (ref >60)
GLUCOSE: 152 mg/dL — AB (ref 65–99)
POTASSIUM: 3.2 mmol/L — AB (ref 3.5–5.1)
SODIUM: 133 mmol/L — AB (ref 135–145)
Total Bilirubin: 0.5 mg/dL (ref 0.3–1.2)
Total Protein: 8.7 g/dL — ABNORMAL HIGH (ref 6.5–8.1)

## 2017-04-02 MED ORDER — ONDANSETRON HCL 4 MG/2ML IJ SOLN
4.0000 mg | Freq: Once | INTRAMUSCULAR | Status: AC
  Administered 2017-04-02: 4 mg via INTRAVENOUS
  Filled 2017-04-02: qty 2

## 2017-04-02 MED ORDER — SODIUM CHLORIDE 0.9 % IV BOLUS (SEPSIS)
1000.0000 mL | Freq: Once | INTRAVENOUS | Status: AC
  Administered 2017-04-02: 1000 mL via INTRAVENOUS

## 2017-04-02 MED ORDER — MORPHINE SULFATE (PF) 4 MG/ML IV SOLN
4.0000 mg | Freq: Once | INTRAVENOUS | Status: AC
  Administered 2017-04-02: 4 mg via INTRAVENOUS
  Filled 2017-04-02: qty 1

## 2017-04-02 NOTE — ED Triage Notes (Signed)
Patient has hx of pancreatitis and had 3 beers 3 days ago and that is when the pain started. Pt has pain in his left side with nausea and some vomiting.

## 2017-04-02 NOTE — ED Provider Notes (Signed)
[facility] Emergency Department Provider Note  ____________________________________________  Time seen: Approximately 11:11 PM  I have reviewed the triage vital signs and the nursing notes.   HISTORY  Chief Complaint Flank Pain and Abdominal Pain   HPI Noah Velasquez is a 51 y.o. male with a history of alcohol abuse, cocaine abuse, hypertension, pancreatitis and presents for evaluation of left abdominal and flank pain.Patient reports 3 days ago he had 3 beers and since then has had left-sided abdominal pain that he describes as dull, 10 out of 10, radiating to his back, associated with nausea and several episodes of nonbloody nonbilious emesis. Patient has been unable to keep anything down. Patient has had several episodes of pancreatitis in the past requiring pancreatic stent due to pancreatic cysts. The stent has been removed since 2014. No other abdominal surgeries. Last cocaine use was a week ago. Patient denies fever, chills, diarrhea, chest pain, shortness of breath, dysuria, hematuria, history of kidney stones.  Past Medical History:  Diagnosis Date  . Chronic hip pain   . Chronic pancreatitis ([place])   . Hypertension   . Pancreatitis     Patient Active Problem List   Diagnosis Date Noted  . Pancreatitis 04/29/2016    Past Surgical History:  Procedure Laterality Date  . pancreatic stent placement      Prior to Admission medications   Medication Sig Start Date End Date Taking? Authorizing Provider  buPROPion (WELLBUTRIN SR) 150 MG 12 hr tablet Take 300 mg by mouth daily. 03/11/16 06/09/16  [provider]  ferrous sulfate 325 (65 FE) MG tablet Take 325 mg by mouth daily. 03/09/16 03/09/17  [provider]  loratadine (CLARITIN) 10 MG tablet Take 10 mg by mouth daily.    [provider]  Pancrelipase, Lip-Prot-Amyl, (ZENPEP) 15000 units CPEP Take 1 capsule by mouth 3 (three) times daily. With meals    [provider]  ranitidine (ZANTAC) 150 MG tablet Take 150 mg by mouth 2 (two) times daily.    [provider]  sertraline (ZOLOFT) 100 MG tablet Take 100 mg by mouth. 03/11/16 03/11/17  [provider]  traZODone (DESYREL) 150 MG tablet Take 1 tablet by mouth at bedtime as needed for sleep.  03/11/16   [provider]    Allergies Hctz [hydrochlorothiazide]  Family History  Problem Relation Age of Onset  . Diabetes Mother     Social History Social History  Substance Use Topics  . Smoking status: Current Every Day Smoker    Packs/day: 0.50    Types: Cigarettes  . Smokeless tobacco: Never Used  . Alcohol use Yes     Comment: usually none, but started back drinking 12 pack last week    Review of Systems  Constitutional: Negative for fever. Eyes: Negative for visual changes. ENT: Negative for sore throat. Neck: No neck pain  Cardiovascular: Negative for chest pain. Respiratory: Negative for shortness of breath. Gastrointestinal: + L sided abdominal pain, nausea, and vomiting. No diarrhea. Genitourinary: Negative for dysuria. + L flank pain Musculoskeletal: Negative for back pain. Skin: Negative for rash. Neurological: Negative for headaches, weakness or numbness. Psych: No SI or HI  ____________________________________________   PHYSICAL EXAM:  VITAL SIGNS: ED Triage Vitals  Enc Vitals Group     BP 04/02/17 2206 127/85     Pulse Rate 04/02/17 2206 (!) 111     Resp 04/02/17 2206 19     Temp 04/02/17 2206 98.6 F (37 C)  Temp Source 04/02/17 2206 Oral     SpO2 04/02/17 2206 95 %     Weight 04/02/17 2206 190 lb (86.2 kg)     Height 04/02/17 2206 5\' 8"  (1.727 m)     Head Circumference --      Peak Flow --      Pain Score 04/02/17 2205 10     Pain Loc --      Pain Edu? --      Excl. in [place]? --     Constitutional: Alert and oriented. Well appearing and in no apparent distress. HEENT:      Head: Normocephalic and atraumatic.         Eyes:  Conjunctivae are normal. Sclera is non-icteric.       Mouth/Throat: Mucous membranes are moist.       Neck: Supple with no signs of meningismus. Cardiovascular: Tachycardic with regular rhythm. No murmurs, gallops, or rubs. 2+ symmetrical distal pulses are present in all extremities. No JVD. Respiratory: Normal respiratory effort. Lungs are clear to auscultation bilaterally. No wheezes, crackles, or rhonchi.  Gastrointestinal: Soft, tenderness palpation the epigastric and left quadrants,non distended with positive bowel sounds. No rebound or guarding. Genitourinary: No CVA tenderness. Musculoskeletal: Nontender with normal range of motion in all extremities. No edema, cyanosis, or erythema of extremities. Neurologic: Normal speech and language. Face is symmetric. Moving all extremities. No gross focal neurologic deficits are appreciated. Skin: Skin is warm, dry and intact. No rash noted. Psychiatric: Mood and affect are normal. Speech and behavior are normal.  ____________________________________________   LABS (all labs ordered are listed, but only abnormal results are displayed)  Labs Reviewed  LIPASE, BLOOD - Abnormal; Notable for the following:       Result Value   Lipase 64 (*)    All other components within normal limits  COMPREHENSIVE METABOLIC PANEL - Abnormal; Notable for the following:    Sodium 133 (*)    Potassium 3.2 (*)    CO2 21 (*)    Glucose, Bld 152 (*)    BUN 31 (*)    Creatinine, Ser 2.02 (*)    Total Protein 8.7 (*)    AST 54 (*)    GFR calc non Af Amer 36 (*)    GFR calc Af Amer 42 (*)    All other components within normal limits  CBC - Abnormal; Notable for the following:    WBC 15.3 (*)    All other components within normal limits  URINALYSIS, COMPLETE (UACMP) WITH MICROSCOPIC   ____________________________________________  EKG  none  ____________________________________________  RADIOLOGY  Ct a/p: Acute pancreatitis    ____________________________________________   PROCEDURES  Procedure(s) performed: None Procedures Critical Care performed:  None ____________________________________________   INITIAL IMPRESSION / ASSESSMENT AND PLAN / ED COURSE  51 y.o. male with a history of alcohol abuse, cocaine abuse, hypertension, pancreatitis and presents for evaluation of left abdominal and flank pain x 3 days. Patient is tachycardic, afebrile, abdomen shows tenderness to palpation on the epigastric and left quadrants and no rebound or guarding. Differential diagnoses including pancreatitis, diverticulitis, peptic ulcer disease, colitis, kidney stone. Labs showing elevated lipase of 64, acute kidney injury with creatinine of 2.02, slightly elevated AST of 54, leukocytosis of 15.3. We'll give IV fluids, morphine and Zofran. We'll send patient for CT abdomen and pelvis without contrast.    _________________________ 12:21 AM on 04/03/2017 -----------------------------------------  CT showing acute pancreatitis with no further complications however due to patient's acute kidney injury he  is going to be admitted to the hospitalist service for IV hydration and pain control.  Pertinent labs & imaging results that were available during my care of the patient were reviewed by me and considered in my medical decision making (see chart for details).    ____________________________________________   FINAL CLINICAL IMPRESSION(S) / ED DIAGNOSES  Final diagnoses:  Alcohol-induced acute pancreatitis without infection or necrosis  Acute kidney injury ([place])      NEW MEDICATIONS STARTED DURING THIS VISIT:  New Prescriptions   No medications on file     Note:  This document was prepared using Dragon voice recognition software and may include unintentional dictation errors.    Rudene Re, MD 04/03/17 [phone]

## 2017-04-03 ENCOUNTER — Encounter: Payer: Self-pay | Admitting: Internal Medicine

## 2017-04-03 DIAGNOSIS — I10 Essential (primary) hypertension: Secondary | ICD-10-CM | POA: Diagnosis present

## 2017-04-03 DIAGNOSIS — K852 Alcohol induced acute pancreatitis without necrosis or infection: Secondary | ICD-10-CM | POA: Diagnosis not present

## 2017-04-03 DIAGNOSIS — G8929 Other chronic pain: Secondary | ICD-10-CM | POA: Diagnosis present

## 2017-04-03 DIAGNOSIS — N179 Acute kidney failure, unspecified: Secondary | ICD-10-CM | POA: Diagnosis not present

## 2017-04-03 DIAGNOSIS — Z888 Allergy status to other drugs, medicaments and biological substances status: Secondary | ICD-10-CM | POA: Diagnosis not present

## 2017-04-03 DIAGNOSIS — Z79899 Other long term (current) drug therapy: Secondary | ICD-10-CM | POA: Diagnosis not present

## 2017-04-03 DIAGNOSIS — E860 Dehydration: Secondary | ICD-10-CM | POA: Diagnosis present

## 2017-04-03 DIAGNOSIS — F17210 Nicotine dependence, cigarettes, uncomplicated: Secondary | ICD-10-CM | POA: Diagnosis present

## 2017-04-03 DIAGNOSIS — F419 Anxiety disorder, unspecified: Secondary | ICD-10-CM | POA: Diagnosis present

## 2017-04-03 DIAGNOSIS — M25559 Pain in unspecified hip: Secondary | ICD-10-CM | POA: Diagnosis present

## 2017-04-03 DIAGNOSIS — E876 Hypokalemia: Secondary | ICD-10-CM | POA: Diagnosis present

## 2017-04-03 DIAGNOSIS — F329 Major depressive disorder, single episode, unspecified: Secondary | ICD-10-CM | POA: Diagnosis present

## 2017-04-03 DIAGNOSIS — E871 Hypo-osmolality and hyponatremia: Secondary | ICD-10-CM | POA: Diagnosis present

## 2017-04-03 LAB — BASIC METABOLIC PANEL
Anion gap: 7 (ref 5–15)
BUN: 26 mg/dL — ABNORMAL HIGH (ref 6–20)
CHLORIDE: 104 mmol/L (ref 101–111)
CO2: 24 mmol/L (ref 22–32)
Calcium: 8.5 mg/dL — ABNORMAL LOW (ref 8.9–10.3)
Creatinine, Ser: 1.72 mg/dL — ABNORMAL HIGH (ref 0.61–1.24)
GFR calc non Af Amer: 44 mL/min — ABNORMAL LOW (ref >60)
GFR, EST AFRICAN AMERICAN: 51 mL/min — AB (ref >60)
Glucose, Bld: 152 mg/dL — ABNORMAL HIGH (ref 65–99)
POTASSIUM: 3.6 mmol/L (ref 3.5–5.1)
SODIUM: 135 mmol/L (ref 135–145)

## 2017-04-03 LAB — URINALYSIS, COMPLETE (UACMP) WITH MICROSCOPIC
BACTERIA UA: NONE SEEN
Bilirubin Urine: NEGATIVE
Glucose, UA: NEGATIVE mg/dL
Ketones, ur: NEGATIVE mg/dL
Leukocytes, UA: NEGATIVE
Nitrite: NEGATIVE
PH: 5 (ref 5.0–8.0)
Protein, ur: NEGATIVE mg/dL
SPECIFIC GRAVITY, URINE: 1.019 (ref 1.005–1.030)

## 2017-04-03 LAB — CBC
HEMATOCRIT: 41.1 % (ref 40.0–52.0)
Hemoglobin: 14.1 g/dL (ref 13.0–18.0)
MCH: 31.2 pg (ref 26.0–34.0)
MCHC: 34.4 g/dL (ref 32.0–36.0)
MCV: 90.7 fL (ref 80.0–100.0)
PLATELETS: 193 10*3/uL (ref 150–440)
RBC: 4.53 MIL/uL (ref 4.40–5.90)
RDW: 13.7 % (ref 11.5–14.5)
WBC: 13.9 10*3/uL — AB (ref 3.8–10.6)

## 2017-04-03 LAB — LIPASE, BLOOD: Lipase: 37 U/L (ref 11–51)

## 2017-04-03 MED ORDER — ACETAMINOPHEN 650 MG RE SUPP: 650.0000 mg | Freq: Four times a day (QID) | RECTAL | Status: DC | PRN

## 2017-04-03 MED ORDER — SODIUM CHLORIDE 0.9 % IV SOLN
INTRAVENOUS | Status: DC
  Administered 2017-04-03 – 2017-04-04 (×3): via INTRAVENOUS

## 2017-04-03 MED ORDER — BUPROPION HCL ER (SR) 150 MG PO TB12
300.0000 mg | ORAL_TABLET | Freq: Every day | ORAL | Status: DC
  Administered 2017-04-04: 08:00:00 300 mg via ORAL
  Filled 2017-04-03 (×2): qty 2

## 2017-04-03 MED ORDER — MORPHINE SULFATE (PF) 2 MG/ML IV SOLN
2.0000 mg | INTRAVENOUS | Status: DC | PRN
  Administered 2017-04-03 – 2017-04-04 (×10): 2 mg via INTRAVENOUS
  Filled 2017-04-03 (×10): qty 1

## 2017-04-03 MED ORDER — HEPARIN SODIUM (PORCINE) 5000 UNIT/ML IJ SOLN
5000.0000 [IU] | Freq: Three times a day (TID) | INTRAMUSCULAR | Status: DC
  Administered 2017-04-03 – 2017-04-04 (×3): 5000 [IU] via SUBCUTANEOUS
  Filled 2017-04-03 (×3): qty 1

## 2017-04-03 MED ORDER — SENNOSIDES-DOCUSATE SODIUM 8.6-50 MG PO TABS
1.0000 | ORAL_TABLET | Freq: Every evening | ORAL | Status: DC | PRN
  Administered 2017-04-04: 09:00:00 1 via ORAL
  Filled 2017-04-03: qty 1

## 2017-04-03 MED ORDER — PANCRELIPASE (LIP-PROT-AMYL) 12000-38000 UNITS PO CPEP
12000.0000 [IU] | ORAL_CAPSULE | Freq: Three times a day (TID) | ORAL | Status: DC
  Administered 2017-04-04 (×2): 12000 [IU] via ORAL
  Filled 2017-04-03 (×2): qty 1

## 2017-04-03 MED ORDER — POTASSIUM CHLORIDE CRYS ER 20 MEQ PO TBCR
EXTENDED_RELEASE_TABLET | ORAL | Status: AC
  Filled 2017-04-03: qty 2

## 2017-04-03 MED ORDER — ONDANSETRON HCL 4 MG PO TABS: 4.0000 mg | ORAL_TABLET | Freq: Four times a day (QID) | ORAL | Status: DC | PRN

## 2017-04-03 MED ORDER — MORPHINE SULFATE (PF) 4 MG/ML IV SOLN
4.0000 mg | Freq: Once | INTRAVENOUS | Status: AC
  Administered 2017-04-03: 4 mg via INTRAVENOUS
  Filled 2017-04-03: qty 1

## 2017-04-03 MED ORDER — ONDANSETRON HCL 4 MG/2ML IJ SOLN: 4.0000 mg | Freq: Four times a day (QID) | INTRAMUSCULAR | Status: DC | PRN

## 2017-04-03 MED ORDER — TRAZODONE HCL 50 MG PO TABS
150.0000 mg | ORAL_TABLET | Freq: Every evening | ORAL | Status: DC | PRN
  Administered 2017-04-03: 150 mg via ORAL
  Filled 2017-04-03: qty 3

## 2017-04-03 MED ORDER — POTASSIUM CHLORIDE CRYS ER 20 MEQ PO TBCR
40.0000 meq | EXTENDED_RELEASE_TABLET | Freq: Once | ORAL | Status: AC
  Administered 2017-04-03: 40 meq via ORAL

## 2017-04-03 MED ORDER — ACETAMINOPHEN 325 MG PO TABS
650.0000 mg | ORAL_TABLET | Freq: Four times a day (QID) | ORAL | Status: DC | PRN
  Administered 2017-04-03 – 2017-04-04 (×3): 650 mg via ORAL
  Filled 2017-04-03 (×4): qty 2

## 2017-04-03 NOTE — ED Notes (Signed)
Pt resting, awoken by this rn entering room. Pt denies needs at this time. Pt updated on delay for admission. Pt verbalizes understanding. Lights dimmed for comfort.

## 2017-04-03 NOTE — Clinical Social Work Note (Signed)
CSW received consult for medications needs, which is the purview pf RNCM. RNCM is aware. CSW is signing off.  Santiago Bumpers, MSW, Latanya Presser [phone]

## 2017-04-03 NOTE — Plan of Care (Signed)
Problem: Education: Goal: Knowledge of [facility] Education information/materials will improve Outcome: Progressing Pt likes to be called Noah Velasquez  PAST MEDICAL HISTORY:   Past Medical History:  Diagnosis Date  . Chronic hip pain   . Chronic pancreatitis ([place])   . Hypertension   . Pancreatitis          PAST SURGICAL HISTORY: Past Surgical History:  Procedure Laterality Date  . pancreatic stent placement     Pt is well controlled with home medications

## 2017-04-03 NOTE — H&P (Signed)
[facility] [place] NAME: Noah Velasquez    MR#:  [id number]  DATE OF BIRTH:  [birth year]  DATE OF ADMISSION:  04/02/2017  PRIMARY CARE PHYSICIAN: Patient, No Pcp Per   REQUESTING/REFERRING PHYSICIAN:   CHIEF COMPLAINT:   Chief Complaint  Patient presents with  . Flank Pain  . Abdominal Pain    HISTORY OF PRESENT ILLNESS: Noah Velasquez  is a 51 y.o. male with a known history of Chronic pancreatitis, hypertension presented to the emergency room with abdominal discomfort. Patient has pain in the abdomen around umbilicus radiating to the back.the pain is sharp in nature 5 out of 10 on a scale of 1-10. He had 2 beers 2 days ago. Also has some nausea and episode of vomiting. Patient was evaluated in the emergency room his lipase was mildly elevated. He was worked up with CT abdomen which showed acute on chronic pancreatitis and a left lower lobe lung nodule. No pancreatic cyst noted. Hospitalist service was consulted. No fever and chills. No complaints of any chest discomfort.  PAST MEDICAL HISTORY:   Past Medical History:  Diagnosis Date  . Chronic hip pain   . Chronic pancreatitis ([place])   . Hypertension   . Pancreatitis     PAST SURGICAL HISTORY: Past Surgical History:  Procedure Laterality Date  . pancreatic stent placement      SOCIAL HISTORY:  Social History  Substance Use Topics  . Smoking status: Current Every Day Smoker    Packs/day: 0.50    Types: Cigarettes  . Smokeless tobacco: Never Used  . Alcohol use Yes     Comment: usually none, but started back drinking 12 pack last week    FAMILY HISTORY:  Family History  Problem Relation Age of Onset  . Diabetes Mother   . Sleep apnea Mother   . CAD Neg Hx     DRUG ALLERGIES:  Allergies  Allergen Reactions  . Hctz [Hydrochlorothiazide] Swelling    REVIEW OF SYSTEMS:   CONSTITUTIONAL: No fever, fatigue or weakness.  EYES: No blurred or double vision.  EARS,  NOSE, AND THROAT: No tinnitus or ear pain.  RESPIRATORY: No cough, shortness of breath, wheezing or hemoptysis.  CARDIOVASCULAR: No chest pain, orthopnea, edema.  GASTROINTESTINAL: Has nausea, vomiting,  abdominal pain.  No diarrhea. GENITOURINARY: No dysuria, hematuria.  ENDOCRINE: No polyuria, nocturia,  HEMATOLOGY: No anemia, easy bruising or bleeding SKIN: No rash or lesion. MUSCULOSKELETAL: No joint pain or arthritis.   NEUROLOGIC: No tingling, numbness, weakness.  PSYCHIATRY: No anxiety or depression.   MEDICATIONS AT HOME:  Prior to Admission medications   Medication Sig Start Date End Date Taking? Authorizing Provider  buPROPion (WELLBUTRIN SR) 150 MG 12 hr tablet Take 300 mg by mouth daily. 03/11/16 06/09/16  [provider]  ferrous sulfate 325 (65 FE) MG tablet Take 325 mg by mouth daily. 03/09/16 03/09/17  [provider]  loratadine (CLARITIN) 10 MG tablet Take 10 mg by mouth daily.    [provider]  Pancrelipase, Lip-Prot-Amyl, (ZENPEP) 15000 units CPEP Take 1 capsule by mouth 3 (three) times daily. With meals    [provider]  ranitidine (ZANTAC) 150 MG tablet Take 150 mg by mouth 2 (two) times daily.    [provider]  sertraline (ZOLOFT) 100 MG tablet Take 100 mg by mouth. 03/11/16 03/11/17  [provider]  traZODone (DESYREL) 150 MG tablet Take 1 tablet by mouth at bedtime as needed for  sleep.  03/11/16   [provider]      PHYSICAL EXAMINATION:   VITAL SIGNS: Blood pressure 108/78, pulse 93, temperature 98.6 F (37 C), temperature source Oral, resp. rate 19, height 5\' 8"  (1.727 m), weight 86.2 kg (190 lb), SpO2 95 %.  GENERAL:  51 y.o.-year-old patient lying in the bed with no acute distress.  EYES: Pupils equal, round, reactive to light and accommodation. No scleral icterus. Extraocular muscles intact.  HEENT: Head atraumatic, normocephalic. Oropharynx and nasopharynx clear.  NECK:  Supple, no  jugular venous distention. No thyroid enlargement, no tenderness.  LUNGS: Normal breath sounds bilaterally, no wheezing, rales,rhonchi or crepitation. No use of accessory muscles of respiration.  CARDIOVASCULAR: S1, S2 normal. No murmurs, rubs, or gallops.  ABDOMEN: Soft, tenderness around umbilicus noted, nondistended. Bowel sounds present. No organomegaly or mass.  EXTREMITIES: No pedal edema, cyanosis, or clubbing.  NEUROLOGIC: Cranial nerves II through XII are intact. Muscle strength 5/5 in all extremities. Sensation intact. Gait not checked.  PSYCHIATRIC: The patient is alert and oriented x 3.  SKIN: No obvious rash, lesion, or ulcer.   LABORATORY PANEL:   CBC  Recent Labs Lab 04/02/17 2222  WBC 15.3*  HGB 15.4  HCT 44.6  PLT 214  MCV 89.6  MCH 30.9  MCHC 34.5  RDW 13.9   ------------------------------------------------------------------------------------------------------------------  Chemistries   Recent Labs Lab 04/02/17 2222  NA 133*  K 3.2*  CL 101  CO2 21*  GLUCOSE 152*  BUN 31*  CREATININE 2.02*  CALCIUM 9.1  AST 54*  ALT 22  ALKPHOS 121  BILITOT 0.5   ------------------------------------------------------------------------------------------------------------------ estimated creatinine clearance is 46.2 mL/min (A) (by C-G formula based on SCr of 2.02 mg/dL (H)). ------------------------------------------------------------------------------------------------------------------ No results for input(s): TSH, T4TOTAL, T3FREE, THYROIDAB in the last 72 hours.  Invalid input(s): FREET3   Coagulation profile No results for input(s): INR, PROTIME in the last 168 hours. ------------------------------------------------------------------------------------------------------------------- No results for input(s): DDIMER in the last 72  hours. -------------------------------------------------------------------------------------------------------------------  Cardiac Enzymes No results for input(s): CKMB, TROPONINI, MYOGLOBIN in the last 168 hours.  Invalid input(s): CK ------------------------------------------------------------------------------------------------------------------ Invalid input(s): POCBNP  ---------------------------------------------------------------------------------------------------------------  Urinalysis    Component Value Date/Time   COLORURINE YELLOW (A) 04/29/2016 1037   APPEARANCEUR CLEAR (A) 04/29/2016 1037   LABSPEC 1.014 04/29/2016 1037   PHURINE 7.0 04/29/2016 1037   GLUCOSEU 50 (A) 04/29/2016 1037   HGBUR NEGATIVE 04/29/2016 1037   BILIRUBINUR NEGATIVE 04/29/2016 1037   KETONESUR TRACE (A) 04/29/2016 1037   PROTEINUR 30 (A) 04/29/2016 1037   NITRITE NEGATIVE 04/29/2016 1037   LEUKOCYTESUR NEGATIVE 04/29/2016 1037     RADIOLOGY: Ct Renal Stone Study  Result Date: 04/02/2017 CLINICAL DATA:  Abdominal pain.  Left flank and abdominal pain. EXAM: CT ABDOMEN AND PELVIS WITHOUT CONTRAST TECHNIQUE: Multidetector CT imaging of the abdomen and pelvis was performed following the standard protocol without IV contrast. COMPARISON:  04/29/2016 FINDINGS: Lower chest: Pleural based 6 mm left lobe pulmonary nodule. Mild subpulmonic scarring in the lung bases. Small hiatal hernia. Hepatobiliary: The liver is prominent size of decreased density consistent with steatosis. Scattered calcified granulomas. Mild gallbladder distention with probable layering sludge. No biliary dilatation. Pancreas: Peripancreatic fat stranding about the distal body and tail. Progressive pancreatic calcifications from prior exam. Cystic dilatation of the distal common bile duct measures 9 mm, slight decrease in cystic component from prior exam. Probable stones in the more proximal duct. No peripancreatic fluid collection.  Spleen: Normal in size without focal abnormality. Splenule inferiorly. Adrenals/Urinary  Tract: Low-density 11 mm left adrenal nodule consistent with adenoma. Right adrenal gland is normal. No hydronephrosis. No urolithiasis. Both ureters are decompressed. Minimal symmetric nonspecific perinephric edema. Urinary bladder is minimally distended, no bladder wall thickening. Stomach/Bowel: Small hiatal hernia. Stomach physiologically distended. No bowel obstruction or inflammation. Noninflamed nonobstructed small bowel within knee umbilical hernia. Air-filled appendix without appendicitis. Small to moderate colonic stool burden. No colonic inflammation. Vascular/Lymphatic: Prominent bilateral inguinal nodes, 11 mm on the right and 8 mm on the left, unchanged from prior exam. Small bilateral external iliac nodes. Periportal node measuring 10 mm, unchanged from prior. Aortic atherosclerosis without aneurysm ectasia distally measuring 2.3 cm, unchanged. Reproductive: Prostate is unremarkable. Other: Small umbilical hernia contains normal small bowel. No intra-abdominal ascites. No free air. Musculoskeletal: There are no acute or suspicious osseous abnormalities. Prominent Schmorl's node at L3. Subchondral sclerosis in the right femoral head suggesting AVN. No subchondral collapse. IMPRESSION: 1. Acute on chronic pancreatitis.  No peripancreatic pseudocyst. 2. Hepatic steatosis. 3. 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 4. Umbilical hernia containing normal small bowel. 5.  Aortic Atherosclerosis (ICD10-I70.0). 6. Pleural-based 6 mm left lower lobe pulmonary nodule. This is not included in the field of view previously. Non-contrast chest CT at 6-12 months is recommended. If the nodule is stable at time of repeat CT, then future CT at 18-24 months (from today's scan) is considered optional for low-risk patients, but is recommended for high-risk patients. This recommendation follows the consensus  statement: Guidelines for Management of Incidental Pulmonary Nodules Detected on CT Images: From the Fleischner Society 2017; Radiology 2017; 284:228-243. Electronically Signed   By: Jeb Levering M.D.   On: 04/02/2017 23:53    EKG: Orders placed or performed during the hospital encounter of 04/29/16  . EKG 12-Lead  . EKG 12-Lead    IMPRESSION AND PLAN: 51 year old male patient with history of pancreatitis, hypertension presented to the emergency room with abdominal pain, nausea. Admitting diagnosis 1. Acute on chronic pancreatitis 2. Hypokalemia 3.hyponatremia 4. Acute renal insufficiency  Treatment plan Admit patient to medical floor IV fluid hydration Replace potassium Keep patient nothing by mouth Follow-up lipase level Follow-up electrolytes Follow-up renal function DVT prophylaxis with subcutaneous heparin  All the records are reviewed and case discussed with ED provider. Management plans discussed with the patient, family and they are in agreement.  CODE STATUS:FULL CODE Code Status History    Date Active Date Inactive Code Status Order ID Comments User Context   04/29/2016 11:22 AM 04/30/2016  6:47 PM Full Code [id number]  Hillary Bow, MD ED       TOTAL TIME TAKING CARE OF THIS PATIENT: 52 minutes.    Saundra Shelling M.D on 04/03/2017 at 3:00 AM  Between 7am to 6pm - Pager - [phone]  After 6pm go to [email or web address] - password [email or web address] [facility]  Office  [phone]  CC: Primary care physician; Patient, No Pcp Per

## 2017-04-03 NOTE — Progress Notes (Signed)
Patient seen and chart reviewed. Continue current management for acute on chronic pancreatitis. Patient must remain nothing by mouth until abdominal pain resolved.

## 2017-04-03 NOTE — ED Notes (Signed)
Transport pt to floor room 121.AS

## 2017-04-04 LAB — BASIC METABOLIC PANEL
Anion gap: 7 (ref 5–15)
BUN: 16 mg/dL (ref 6–20)
CHLORIDE: 107 mmol/L (ref 101–111)
CO2: 22 mmol/L (ref 22–32)
Calcium: 8.4 mg/dL — ABNORMAL LOW (ref 8.9–10.3)
Creatinine, Ser: 1.28 mg/dL — ABNORMAL HIGH (ref 0.61–1.24)
Glucose, Bld: 82 mg/dL (ref 65–99)
POTASSIUM: 3.9 mmol/L (ref 3.5–5.1)
SODIUM: 136 mmol/L (ref 135–145)

## 2017-04-04 LAB — CBC
HEMATOCRIT: 37.3 % — AB (ref 40.0–52.0)
Hemoglobin: 12.9 g/dL — ABNORMAL LOW (ref 13.0–18.0)
MCH: 31.9 pg (ref 26.0–34.0)
MCHC: 34.7 g/dL (ref 32.0–36.0)
MCV: 91.9 fL (ref 80.0–100.0)
PLATELETS: 176 10*3/uL (ref 150–440)
RBC: 4.05 MIL/uL — AB (ref 4.40–5.90)
RDW: 13.8 % (ref 11.5–14.5)
WBC: 9.3 10*3/uL (ref 3.8–10.6)

## 2017-04-04 LAB — HIV ANTIBODY (ROUTINE TESTING W REFLEX): HIV SCREEN 4TH GENERATION: NONREACTIVE

## 2017-04-04 MED ORDER — BISACODYL 10 MG RE SUPP
10.0000 mg | Freq: Every day | RECTAL | Status: DC
  Administered 2017-04-04: 08:00:00 10 mg via RECTAL
  Filled 2017-04-04: qty 1

## 2017-04-04 NOTE — Care Management Obs Status (Signed)
[facility] NOTIFICATION   Patient Details  Name: Noah Velasquez MRN: [id number] Date of Birth: 02-20-1966   Medicare Observation Status Notification Given:  No   Discharged within 24 hours    Berneta Sconyers A, RN 04/04/2017, 8:16 AM

## 2017-04-04 NOTE — Plan of Care (Signed)
Problem: Nutritional: Goal: Ability to achieve adequate nutritional intake will improve Outcome: Progressing Pt advanced to clear liquid diet per his request. Able to tolerate ice chips.  Problem: Pain Managment: Goal: General experience of comfort will improve Outcome: Not Progressing Pt continues to c/o abdominal pain 8/10. PRN morphine requested frequently.

## 2017-04-04 NOTE — Progress Notes (Signed)
Patient discharged home per MD order. All discharge instructions given and all questions answered. 

## 2017-04-04 NOTE — Progress Notes (Signed)
Patient refuses bed alarm. Patient re-educated on reason for bed alarm.

## 2017-04-04 NOTE — Discharge Summary (Addendum)
[place] [facility] [place] NAME: Noah Velasquez    MR#:  [id number]  DATE OF BIRTH:  June 15, 1966  DATE OF ADMISSION:  04/02/2017 ADMITTING PHYSICIAN: Saundra Shelling, MD  DATE OF DISCHARGE: 04/04/2017  PRIMARY CARE PHYSICIAN: Deeann Cree MD    ADMISSION DIAGNOSIS:  Acute kidney injury ([place]) [N17.9] Alcohol-induced acute pancreatitis without infection or necrosis [K85.20]  DISCHARGE DIAGNOSIS:  Active Problems:   Acute and chronic pancreatitis   SECONDARY DIAGNOSIS:   Past Medical History:  Diagnosis Date  . Chronic hip pain   . Chronic pancreatitis ([place])   . Hypertension   . Pancreatitis     HOSPITAL COURSE:   51 year old male with history of chronic pancreatitis who presents with abdominal pain about have acute on chronic pancreatitis.  1. Acute on chronic pancreatitis: Patient was admitted to the hospital and treated for acute pancreatitis. Pancreatitis was triggered by EtOH use. His abdominal pain has subsided. He is tolerating diet.  2. Hypertension: Patient may resume outpatient medications  3. Depression and anxiety: Patient may continue outpatient regimen.  4. Pleural-based 6 mm left lower lobe pulmonary nodule. This is not included in the field of view previously. Non-contrast chest CT at 6-12 months is recommended. If the nodule is stable at time of repeat CT, then future CT at 18-24 months (from today's scan) is considered optional for low-risk patients, but is recommended for high-risk patients. This recommendation follows the consensus statement: Guidelines for Management of Incidental Pulmonary Nodules Detected on CT Images: From the Fleischner Society 2017   5. 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  6. Acute kidney injury  DISCHARGE CONDITIONS AND DIET:   Stable Cardiac diet  CONSULTS OBTAINED:    DRUG ALLERGIES:    Allergies  Allergen Reactions  . Hctz [Hydrochlorothiazide] Swelling    DISCHARGE MEDICATIONS:   Current Discharge Medication List    CONTINUE these medications which have NOT CHANGED   Details  atorvastatin (LIPITOR) 40 MG tablet Take 40 mg by mouth daily.    furosemide (LASIX) 20 MG tablet Take 20 mg by mouth daily.    gabapentin (NEURONTIN) 100 MG capsule Take 100-200 mg by mouth at bedtime.    losartan (COZAAR) 100 MG tablet Take 100 mg by mouth daily.    Pancrelipase, Lip-Prot-Amyl, (ZENPEP) 15000 units CPEP Take 1 capsule by mouth 3 (three) times daily. With meals    ranitidine (ZANTAC) 150 MG tablet Take 150 mg by mouth 2 (two) times daily.    sertraline (ZOLOFT) 100 MG tablet Take 100 mg by mouth.    traZODone (DESYREL) 150 MG tablet Take 1 tablet by mouth at bedtime as needed for sleep.       STOP taking these medications     buPROPion (WELLBUTRIN SR) 150 MG 12 hr tablet      naproxen (NAPROSYN) 500 MG tablet           Today   CHIEF COMPLAINT:  Constipated this am   VITAL SIGNS:  Blood pressure 124/72, pulse 84, temperature 98 F (36.7 C), temperature source Oral, resp. rate 20, height 5\' 8"  (1.727 m), weight 83.5 kg (184 lb), SpO2 95 %.   REVIEW OF SYSTEMS:  Review of Systems  Constitutional: Negative.  Negative for chills, fever and malaise/fatigue.  HENT: Negative.  Negative for ear discharge, ear pain, hearing loss, nosebleeds and sore throat.   Eyes: Negative.  Negative  for blurred vision and pain.  Respiratory: Negative.  Negative for cough, hemoptysis, shortness of breath and wheezing.   Cardiovascular: Negative.  Negative for chest pain, palpitations and leg swelling.  Gastrointestinal: Positive for constipation. Negative for abdominal pain, blood in stool, diarrhea, nausea and vomiting.  Genitourinary: Negative.  Negative for dysuria.  Musculoskeletal: Negative.  Negative for back pain.  Skin: Negative.   Neurological: Negative for  dizziness, tremors, speech change, focal weakness, seizures and headaches.  Endo/Heme/Allergies: Negative.  Does not bruise/bleed easily.  Psychiatric/Behavioral: Negative.  Negative for depression, hallucinations and suicidal ideas.     PHYSICAL EXAMINATION:  GENERAL:  51 y.o.-year-old patient lying in the bed with no acute distress.  NECK:  Supple, no jugular venous distention. No thyroid enlargement, no tenderness.  LUNGS: Normal breath sounds bilaterally, no wheezing, rales,rhonchi  No use of accessory muscles of respiration.  CARDIOVASCULAR: S1, S2 normal. No murmurs, rubs, or gallops.  ABDOMEN: Soft, non-tender, non-distended. Bowel sounds present. No organomegaly or mass.  EXTREMITIES: No pedal edema, cyanosis, or clubbing.  PSYCHIATRIC: The patient is alert and oriented x 3.  SKIN: No obvious rash, lesion, or ulcer.   DATA REVIEW:   CBC  Recent Labs Lab 04/04/17 0322  WBC 9.3  HGB 12.9*  HCT 37.3*  PLT 176    Chemistries   Recent Labs Lab 04/02/17 2222  04/04/17 0322  NA 133*  < > 136  K 3.2*  < > 3.9  CL 101  < > 107  CO2 21*  < > 22  GLUCOSE 152*  < > 82  BUN 31*  < > 16  CREATININE 2.02*  < > 1.28*  CALCIUM 9.1  < > 8.4*  AST 54*  --   --   ALT 22  --   --   ALKPHOS 121  --   --   BILITOT 0.5  --   --   < > = values in this interval not displayed.  Cardiac Enzymes No results for input(s): TROPONINI in the last 168 hours.  Microbiology Results  @[email or web address] @  RADIOLOGY:  Ct Renal Stone Study  Result Date: 04/02/2017 CLINICAL DATA:  Abdominal pain.  Left flank and abdominal pain. EXAM: CT ABDOMEN AND PELVIS WITHOUT CONTRAST TECHNIQUE: Multidetector CT imaging of the abdomen and pelvis was performed following the standard protocol without IV contrast. COMPARISON:  04/29/2016 FINDINGS: Lower chest: Pleural based 6 mm left lobe pulmonary nodule. Mild subpulmonic scarring in the lung bases. Small hiatal hernia. Hepatobiliary: The liver is prominent size  of decreased density consistent with steatosis. Scattered calcified granulomas. Mild gallbladder distention with probable layering sludge. No biliary dilatation. Pancreas: Peripancreatic fat stranding about the distal body and tail. Progressive pancreatic calcifications from prior exam. Cystic dilatation of the distal common bile duct measures 9 mm, slight decrease in cystic component from prior exam. Probable stones in the more proximal duct. No peripancreatic fluid collection. Spleen: Normal in size without focal abnormality. Splenule inferiorly. Adrenals/Urinary Tract: Low-density 11 mm left adrenal nodule consistent with adenoma. Right adrenal gland is normal. No hydronephrosis. No urolithiasis. Both ureters are decompressed. Minimal symmetric nonspecific perinephric edema. Urinary bladder is minimally distended, no bladder wall thickening. Stomach/Bowel: Small hiatal hernia. Stomach physiologically distended. No bowel obstruction or inflammation. Noninflamed nonobstructed small bowel within knee umbilical hernia. Air-filled appendix without appendicitis. Small to moderate colonic stool burden. No colonic inflammation. Vascular/Lymphatic: Prominent bilateral inguinal nodes, 11 mm on the right and 8 mm on the left, unchanged from prior exam. Small  bilateral external iliac nodes. Periportal node measuring 10 mm, unchanged from prior. Aortic atherosclerosis without aneurysm ectasia distally measuring 2.3 cm, unchanged. Reproductive: Prostate is unremarkable. Other: Small umbilical hernia contains normal small bowel. No intra-abdominal ascites. No free air. Musculoskeletal: There are no acute or suspicious osseous abnormalities. Prominent Schmorl's node at L3. Subchondral sclerosis in the right femoral head suggesting AVN. No subchondral collapse. IMPRESSION: 1. Acute on chronic pancreatitis.  No peripancreatic pseudocyst. 2. Hepatic steatosis. 3. 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 4.  Umbilical hernia containing normal small bowel. 5.  Aortic Atherosclerosis (ICD10-I70.0). 6. Pleural-based 6 mm left lower lobe pulmonary nodule. This is not included in the field of view previously. Non-contrast chest CT at 6-12 months is recommended. If the nodule is stable at time of repeat CT, then future CT at 18-24 months (from today's scan) is considered optional for low-risk patients, but is recommended for high-risk patients. This recommendation follows the consensus statement: Guidelines for Management of Incidental Pulmonary Nodules Detected on CT Images: From the Fleischner Society 2017; Radiology 2017; 284:228-243. Electronically Signed   By: Jeb Levering M.D.   On: 04/02/2017 23:53      Current Discharge Medication List    CONTINUE these medications which have NOT CHANGED   Details  atorvastatin (LIPITOR) 40 MG tablet Take 40 mg by mouth daily.    furosemide (LASIX) 20 MG tablet Take 20 mg by mouth daily.    gabapentin (NEURONTIN) 100 MG capsule Take 100-200 mg by mouth at bedtime.    losartan (COZAAR) 100 MG tablet Take 100 mg by mouth daily.    Pancrelipase, Lip-Prot-Amyl, (ZENPEP) 15000 units CPEP Take 1 capsule by mouth 3 (three) times daily. With meals    ranitidine (ZANTAC) 150 MG tablet Take 150 mg by mouth 2 (two) times daily.    sertraline (ZOLOFT) 100 MG tablet Take 100 mg by mouth.    traZODone (DESYREL) 150 MG tablet Take 1 tablet by mouth at bedtime as needed for sleep.       STOP taking these medications     buPROPion (WELLBUTRIN SR) 150 MG 12 hr tablet      naproxen (NAPROSYN) 500 MG tablet          A  Management plans discussed with the patient and he is in agreement. Stable for discharge   Patient should follow up with pcp  CODE STATUS:     Code Status Orders        Start     Ordered   04/03/17 0314  Full code  Continuous     04/03/17 0314    Code Status History    Date Active Date Inactive Code Status Order ID Comments User  Context   04/29/2016 11:22 AM 04/30/2016  6:47 PM Full Code [id number]  Hillary Bow, MD ED      TOTAL TIME TAKING CARE OF THIS PATIENT: 38 minutes.    Note: This dictation was prepared with Dragon dictation along with smaller phrase technology. Any transcriptional errors that result from this process are unintentional.  Kimmy Parish M.D on 04/04/2017 at 7:16 AM  Between 7am to 6pm - Pager - [phone] After 6pm go to [email or web address] - Proofreader  [facility]  Office  [phone]  CC: Primary care physician; Deeann Cree MD

## 2017-09-23 DIAGNOSIS — H6123 Impacted cerumen, bilateral: Secondary | ICD-10-CM | POA: Insufficient documentation

## 2018-03-29 ENCOUNTER — Emergency Department: Payer: Medicaid Other

## 2018-03-29 ENCOUNTER — Encounter: Payer: Self-pay | Admitting: Emergency Medicine

## 2018-03-29 ENCOUNTER — Other Ambulatory Visit: Payer: Self-pay

## 2018-03-29 ENCOUNTER — Inpatient Hospital Stay
Admission: EM | Admit: 2018-03-29 | Discharge: 2018-04-04 | DRG: 438 | Disposition: A | Discharge status: Home / Self Care | Payer: Medicaid Other | Attending: Internal Medicine | Admitting: Internal Medicine

## 2018-03-29 DIAGNOSIS — Z888 Allergy status to other drugs, medicaments and biological substances status: Secondary | ICD-10-CM

## 2018-03-29 DIAGNOSIS — R05 Cough: Secondary | ICD-10-CM

## 2018-03-29 DIAGNOSIS — K861 Other chronic pancreatitis: Secondary | ICD-10-CM | POA: Diagnosis present

## 2018-03-29 DIAGNOSIS — R1110 Vomiting, unspecified: Secondary | ICD-10-CM | POA: Diagnosis not present

## 2018-03-29 DIAGNOSIS — R509 Fever, unspecified: Secondary | ICD-10-CM | POA: Diagnosis not present

## 2018-03-29 DIAGNOSIS — Z791 Long term (current) use of non-steroidal anti-inflammatories (NSAID): Secondary | ICD-10-CM | POA: Diagnosis not present

## 2018-03-29 DIAGNOSIS — J9811 Atelectasis: Secondary | ICD-10-CM | POA: Diagnosis not present

## 2018-03-29 DIAGNOSIS — I10 Essential (primary) hypertension: Secondary | ICD-10-CM | POA: Diagnosis not present

## 2018-03-29 DIAGNOSIS — Z23 Encounter for immunization: Secondary | ICD-10-CM | POA: Diagnosis not present

## 2018-03-29 DIAGNOSIS — E785 Hyperlipidemia, unspecified: Secondary | ICD-10-CM | POA: Diagnosis present

## 2018-03-29 DIAGNOSIS — K219 Gastro-esophageal reflux disease without esophagitis: Secondary | ICD-10-CM | POA: Diagnosis present

## 2018-03-29 DIAGNOSIS — R748 Abnormal levels of other serum enzymes: Secondary | ICD-10-CM | POA: Diagnosis present

## 2018-03-29 DIAGNOSIS — R112 Nausea with vomiting, unspecified: Secondary | ICD-10-CM

## 2018-03-29 DIAGNOSIS — M25559 Pain in unspecified hip: Secondary | ICD-10-CM | POA: Diagnosis present

## 2018-03-29 DIAGNOSIS — F329 Major depressive disorder, single episode, unspecified: Secondary | ICD-10-CM | POA: Diagnosis present

## 2018-03-29 DIAGNOSIS — Z9689 Presence of other specified functional implants: Secondary | ICD-10-CM | POA: Diagnosis present

## 2018-03-29 DIAGNOSIS — K8681 Exocrine pancreatic insufficiency: Secondary | ICD-10-CM | POA: Diagnosis present

## 2018-03-29 DIAGNOSIS — G8929 Other chronic pain: Secondary | ICD-10-CM | POA: Diagnosis present

## 2018-03-29 DIAGNOSIS — E871 Hypo-osmolality and hyponatremia: Secondary | ICD-10-CM | POA: Diagnosis not present

## 2018-03-29 DIAGNOSIS — R109 Unspecified abdominal pain: Secondary | ICD-10-CM | POA: Diagnosis not present

## 2018-03-29 DIAGNOSIS — R059 Cough, unspecified: Secondary | ICD-10-CM

## 2018-03-29 DIAGNOSIS — K859 Acute pancreatitis without necrosis or infection, unspecified: Secondary | ICD-10-CM | POA: Diagnosis not present

## 2018-03-29 DIAGNOSIS — J189 Pneumonia, unspecified organism: Secondary | ICD-10-CM | POA: Diagnosis present

## 2018-03-29 DIAGNOSIS — F17210 Nicotine dependence, cigarettes, uncomplicated: Secondary | ICD-10-CM | POA: Diagnosis present

## 2018-03-29 DIAGNOSIS — Z79899 Other long term (current) drug therapy: Secondary | ICD-10-CM

## 2018-03-29 LAB — URINALYSIS, COMPLETE (UACMP) WITH MICROSCOPIC
Bacteria, UA: NONE SEEN
Bilirubin Urine: NEGATIVE
GLUCOSE, UA: NEGATIVE mg/dL
HGB URINE DIPSTICK: NEGATIVE
Ketones, ur: NEGATIVE mg/dL
LEUKOCYTES UA: NEGATIVE
NITRITE: NEGATIVE
PROTEIN: NEGATIVE mg/dL
SQUAMOUS EPITHELIAL / LPF: NONE SEEN (ref 0–5)
Specific Gravity, Urine: 1.028 (ref 1.005–1.030)
pH: 7 (ref 5.0–8.0)

## 2018-03-29 LAB — COMPREHENSIVE METABOLIC PANEL
ALT: 11 U/L (ref 0–44)
AST: 23 U/L (ref 15–41)
Albumin: 3.8 g/dL (ref 3.5–5.0)
Alkaline Phosphatase: 77 U/L (ref 38–126)
Anion gap: 9 (ref 5–15)
BILIRUBIN TOTAL: 1.1 mg/dL (ref 0.3–1.2)
BUN: 15 mg/dL (ref 6–20)
CO2: 22 mmol/L (ref 22–32)
Calcium: 8.8 mg/dL — ABNORMAL LOW (ref 8.9–10.3)
Chloride: 104 mmol/L (ref 98–111)
Creatinine, Ser: 1.25 mg/dL — ABNORMAL HIGH (ref 0.61–1.24)
Glucose, Bld: 137 mg/dL — ABNORMAL HIGH (ref 70–99)
POTASSIUM: 3.6 mmol/L (ref 3.5–5.1)
Sodium: 135 mmol/L (ref 135–145)
TOTAL PROTEIN: 8.3 g/dL — AB (ref 6.5–8.1)

## 2018-03-29 LAB — CBC
HEMATOCRIT: 43.4 % (ref 40.0–52.0)
Hemoglobin: 14.9 g/dL (ref 13.0–18.0)
MCH: 31.4 pg (ref 26.0–34.0)
MCHC: 34.3 g/dL (ref 32.0–36.0)
MCV: 91.7 fL (ref 80.0–100.0)
PLATELETS: 251 10*3/uL (ref 150–440)
RBC: 4.73 MIL/uL (ref 4.40–5.90)
RDW: 14 % (ref 11.5–14.5)
WBC: 13.6 10*3/uL — AB (ref 3.8–10.6)

## 2018-03-29 LAB — ETHANOL

## 2018-03-29 LAB — LIPASE, BLOOD: LIPASE: 146 U/L — AB (ref 11–51)

## 2018-03-29 MED ORDER — MORPHINE SULFATE (PF) 4 MG/ML IV SOLN
4.0000 mg | INTRAVENOUS | Status: DC | PRN
  Administered 2018-03-30 – 2018-03-31 (×5): 4 mg via INTRAVENOUS
  Filled 2018-03-29 (×5): qty 1

## 2018-03-29 MED ORDER — MORPHINE SULFATE (PF) 4 MG/ML IV SOLN
4.0000 mg | Freq: Once | INTRAVENOUS | Status: AC
  Administered 2018-03-29: 4 mg via INTRAVENOUS
  Filled 2018-03-29: qty 1

## 2018-03-29 MED ORDER — MORPHINE SULFATE (PF) 2 MG/ML IV SOLN
2.0000 mg | INTRAVENOUS | Status: DC | PRN
  Administered 2018-03-29 (×2): 2 mg via INTRAVENOUS
  Filled 2018-03-29 (×2): qty 1

## 2018-03-29 MED ORDER — NICOTINE 21 MG/24HR TD PT24
21.0000 mg | MEDICATED_PATCH | Freq: Every day | TRANSDERMAL | Status: DC
  Administered 2018-03-29 – 2018-04-04 (×6): 21 mg via TRANSDERMAL
  Filled 2018-03-29 (×9): qty 1

## 2018-03-29 MED ORDER — AMLODIPINE BESYLATE 10 MG PO TABS
10.0000 mg | ORAL_TABLET | Freq: Every day | ORAL | Status: DC
  Administered 2018-03-29 – 2018-04-04 (×7): 10 mg via ORAL
  Filled 2018-03-29 (×7): qty 1

## 2018-03-29 MED ORDER — FAMOTIDINE 20 MG PO TABS
20.0000 mg | ORAL_TABLET | Freq: Two times a day (BID) | ORAL | Status: DC | PRN
  Administered 2018-04-03: 15:00:00 20 mg via ORAL
  Filled 2018-03-29: qty 1

## 2018-03-29 MED ORDER — HYDROCODONE-ACETAMINOPHEN 5-325 MG PO TABS
1.0000 | ORAL_TABLET | ORAL | Status: DC | PRN
  Administered 2018-03-29 – 2018-04-04 (×20): 2 via ORAL
  Filled 2018-03-29 (×20): qty 2

## 2018-03-29 MED ORDER — ONDANSETRON HCL 4 MG PO TABS
4.0000 mg | ORAL_TABLET | Freq: Four times a day (QID) | ORAL | Status: DC | PRN
  Administered 2018-03-29: 4 mg via ORAL
  Filled 2018-03-29: qty 1

## 2018-03-29 MED ORDER — SERTRALINE HCL 50 MG PO TABS
100.0000 mg | ORAL_TABLET | Freq: Every day | ORAL | Status: DC
  Administered 2018-03-29 – 2018-04-04 (×7): 100 mg via ORAL
  Filled 2018-03-29 (×7): qty 2

## 2018-03-29 MED ORDER — ACETAMINOPHEN 325 MG PO TABS
650.0000 mg | ORAL_TABLET | Freq: Four times a day (QID) | ORAL | Status: DC | PRN
Start: 2018-03-29 — End: 2018-04-04
  Administered 2018-03-30 – 2018-04-02 (×3): 650 mg via ORAL
  Filled 2018-03-29 (×3): qty 2

## 2018-03-29 MED ORDER — FLUTICASONE PROPIONATE 50 MCG/ACT NA SUSP
1.0000 | Freq: Every day | NASAL | Status: DC | PRN
  Filled 2018-03-29: qty 16

## 2018-03-29 MED ORDER — ONDANSETRON HCL 4 MG/2ML IJ SOLN
4.0000 mg | Freq: Once | INTRAMUSCULAR | Status: AC | PRN
  Administered 2018-03-29: 4 mg via INTRAVENOUS
  Filled 2018-03-29: qty 2

## 2018-03-29 MED ORDER — LABETALOL HCL 5 MG/ML IV SOLN
10.0000 mg | INTRAVENOUS | Status: DC | PRN
  Administered 2018-03-29: 10 mg via INTRAVENOUS
  Filled 2018-03-29: qty 4

## 2018-03-29 MED ORDER — TRAZODONE HCL 50 MG PO TABS
150.0000 mg | ORAL_TABLET | Freq: Every evening | ORAL | Status: DC | PRN
  Administered 2018-03-29 – 2018-04-01 (×4): 150 mg via ORAL
  Filled 2018-03-29 (×4): qty 3

## 2018-03-29 MED ORDER — ATORVASTATIN CALCIUM 20 MG PO TABS
40.0000 mg | ORAL_TABLET | Freq: Every day | ORAL | Status: DC
  Administered 2018-03-29 – 2018-04-04 (×7): 40 mg via ORAL
  Filled 2018-03-29 (×7): qty 2

## 2018-03-29 MED ORDER — PNEUMOCOCCAL VAC POLYVALENT 25 MCG/0.5ML IJ INJ
0.5000 mL | INJECTION | INTRAMUSCULAR | Status: AC
  Administered 2018-03-30: 0.5 mL via INTRAMUSCULAR
  Filled 2018-03-29: qty 0.5

## 2018-03-29 MED ORDER — ONDANSETRON HCL 4 MG/2ML IJ SOLN: 4.0000 mg | Freq: Four times a day (QID) | INTRAMUSCULAR | Status: DC | PRN

## 2018-03-29 MED ORDER — ACETAMINOPHEN 650 MG RE SUPP: 650.0000 mg | Freq: Four times a day (QID) | RECTAL | Status: DC | PRN

## 2018-03-29 MED ORDER — IOHEXOL 300 MG/ML  SOLN
100.0000 mL | Freq: Once | INTRAMUSCULAR | Status: AC | PRN
  Administered 2018-03-29: 100 mL via INTRAVENOUS

## 2018-03-29 MED ORDER — PANCRELIPASE (LIP-PROT-AMYL) 12000-38000 UNITS PO CPEP
12000.0000 [IU] | ORAL_CAPSULE | Freq: Three times a day (TID) | ORAL | Status: DC
  Administered 2018-03-29 – 2018-04-04 (×10): 12000 [IU] via ORAL
  Filled 2018-03-29 (×11): qty 1

## 2018-03-29 MED ORDER — ENOXAPARIN SODIUM 40 MG/0.4ML ~~LOC~~ SOLN
40.0000 mg | SUBCUTANEOUS | Status: DC
  Administered 2018-03-29 – 2018-04-03 (×6): 40 mg via SUBCUTANEOUS
  Filled 2018-03-29 (×6): qty 0.4

## 2018-03-29 MED ORDER — SODIUM CHLORIDE 0.9 % IV BOLUS
1000.0000 mL | Freq: Once | INTRAVENOUS | Status: AC
  Administered 2018-03-29: 1000 mL via INTRAVENOUS

## 2018-03-29 MED ORDER — GABAPENTIN 100 MG PO CAPS
100.0000 mg | ORAL_CAPSULE | Freq: Every evening | ORAL | Status: DC | PRN
  Administered 2018-03-31 – 2018-04-01 (×2): 100 mg via ORAL
  Filled 2018-03-29 (×2): qty 1

## 2018-03-29 MED ORDER — BUPROPION HCL ER (SR) 150 MG PO TB12
150.0000 mg | ORAL_TABLET | Freq: Every day | ORAL | Status: DC
  Administered 2018-03-30 – 2018-04-04 (×6): 150 mg via ORAL
  Filled 2018-03-29 (×8): qty 1

## 2018-03-29 MED ORDER — LORATADINE 10 MG PO TABS
10.0000 mg | ORAL_TABLET | Freq: Every day | ORAL | Status: DC
  Administered 2018-03-29 – 2018-04-04 (×7): 10 mg via ORAL
  Filled 2018-03-29 (×7): qty 1

## 2018-03-29 MED ORDER — SODIUM CHLORIDE 0.9 % IV SOLN
INTRAVENOUS | Status: DC
  Administered 2018-03-29 – 2018-04-03 (×16): via INTRAVENOUS

## 2018-03-29 NOTE — ED Notes (Signed)
Pt sleeping. Respirations even and unlabored. NAD. Stretcher in low and locked position. Call bell in reach. Will continue to monitor.

## 2018-03-29 NOTE — H&P (Signed)
[facility] NAME: Noah Velasquez    MR#:  [id number]  DATE OF BIRTH:  1965/11/15  DATE OF ADMISSION:  03/29/2018  PRIMARY CARE PHYSICIAN: Patient, No Pcp Per   REQUESTING/REFERRING PHYSICIAN: Loney Hering, MD CHIEF COMPLAINT:   Chief Complaint  Patient presents with  . Abdominal Pain    HISTORY OF PRESENT ILLNESS: Noah Velasquez  is a 52 y.o. male with a known history of pancreatitis, hypertension, hyperlipidemia who is presenting to the hospital with abdominal pain.  Patient states that he used to drink heavy but only drinks occasionally now.  About a week ago he had 6 beers.  Since then he has not had anything to drink.  He comes to the ER with sharp epigastric pain radiating to his back.  Noted to have elevated lipase as well as elevated WBC count as well as CT suggestive of early pancreatitis.  Patient also complains of nausea vomiting.  PAST MEDICAL HISTORY:   Past Medical History:  Diagnosis Date  . Chronic hip pain   . Chronic pancreatitis ([place])   . Hypertension   . Pancreatitis     PAST SURGICAL HISTORY:  Past Surgical History:  Procedure Laterality Date  . pancreatic stent placement      SOCIAL HISTORY:  Social History   Tobacco Use  . Smoking status: Current Every Day Smoker    Packs/day: 0.50    Types: Cigarettes  . Smokeless tobacco: Never Used  Substance Use Topics  . Alcohol use: Yes    Comment: usually none, but started back drinking 12 pack last week    FAMILY HISTORY:  Family History  Problem Relation Age of Onset  . Diabetes Mother   . Sleep apnea Mother   . CAD Neg Hx     DRUG ALLERGIES:  Allergies  Allergen Reactions  . Hctz [Hydrochlorothiazide] Swelling    REVIEW OF SYSTEMS:   CONSTITUTIONAL: No fever, fatigue or weakness.  EYES: No blurred or double vision.  EARS, NOSE, AND THROAT: No tinnitus or ear pain.  RESPIRATORY: No cough, shortness of breath, wheezing or hemoptysis.   CARDIOVASCULAR: No chest pain, orthopnea, edema.  GASTROINTESTINAL: No nausea, vomiting, diarrhea or positive abdominal pain.  GENITOURINARY: No dysuria, hematuria.  ENDOCRINE: No polyuria, nocturia,  HEMATOLOGY: No anemia, easy bruising or bleeding SKIN: No rash or lesion. MUSCULOSKELETAL: No joint pain or arthritis.   NEUROLOGIC: No tingling, numbness, weakness.  PSYCHIATRY: No anxiety or depression.   MEDICATIONS AT HOME:  Prior to Admission medications   Medication Sig Start Date End Date Taking? Authorizing Provider  amLODipine (NORVASC) 10 MG tablet Take 10 mg by mouth daily.   Yes [provider]  atorvastatin (LIPITOR) 40 MG tablet Take 40 mg by mouth daily.   Yes [provider]  buPROPion (WELLBUTRIN SR) 150 MG 12 hr tablet Take 150 mg by mouth daily.   Yes [provider]  fluticasone (FLONASE) 50 MCG/ACT nasal spray Place 1 spray into both nostrils daily as needed for allergies or rhinitis.   Yes [provider]  gabapentin (NEURONTIN) 100 MG capsule Take 100 mg by mouth at bedtime as needed (sleep).    Yes [provider]  loratadine (CLARITIN) 10 MG tablet Take 10 mg by mouth daily.   Yes [provider]  naproxen (NAPROSYN) 500 MG tablet Take 500 mg by mouth 2 (two) times daily with a meal.   Yes [provider]  Pancrelipase, Lip-Prot-Amyl, (ZENPEP)  15000 units CPEP Take 1 capsule by mouth 3 (three) times daily. With meals   Yes [provider]  ranitidine (ZANTAC) 150 MG tablet Take 150 mg by mouth 2 (two) times daily as needed for heartburn.    Yes [provider]  sertraline (ZOLOFT) 100 MG tablet Take 100 mg by mouth. 03/11/16 03/30/19 Yes [provider]  traZODone (DESYREL) 150 MG tablet Take 1 tablet by mouth at bedtime as needed for sleep.  03/11/16  Yes [provider]      PHYSICAL EXAMINATION:   VITAL SIGNS: Blood pressure (!) 133/96, pulse 88, temperature 98.6 F  (37 C), temperature source Oral, resp. rate 15, weight 83.5 kg, SpO2 95 %.  GENERAL:  52 y.o.-year-old patient lying in the bed with no acute distress.  EYES: Pupils equal, round, reactive to light and accommodation. No scleral icterus. Extraocular muscles intact.  HEENT: Head atraumatic, normocephalic. Oropharynx and nasopharynx clear.  NECK:  Supple, no jugular venous distention. No thyroid enlargement, no tenderness.  LUNGS: Normal breath sounds bilaterally, no wheezing, rales,rhonchi or crepitation. No use of accessory muscles of respiration.  CARDIOVASCULAR: S1, S2 normal. No murmurs, rubs, or gallops.  ABDOMEN: Soft, epigastric tenderness nondistended. Bowel sounds present. No organomegaly or mass.  EXTREMITIES: No pedal edema, cyanosis, or clubbing.  NEUROLOGIC: Cranial nerves II through XII are intact. Muscle strength 5/5 in all extremities. Sensation intact. Gait not checked.  PSYCHIATRIC: The patient is alert and oriented x 3.  SKIN: No obvious rash, lesion, or ulcer.   LABORATORY PANEL:   CBC Recent Labs  Lab 03/29/18 0536  WBC 13.6*  HGB 14.9  HCT 43.4  PLT 251  MCV 91.7  MCH 31.4  MCHC 34.3  RDW 14.0   ------------------------------------------------------------------------------------------------------------------  Chemistries  Recent Labs  Lab 03/29/18 0536  NA 135  K 3.6  CL 104  CO2 22  GLUCOSE 137*  BUN 15  CREATININE 1.25*  CALCIUM 8.8*  AST 23  ALT 11  ALKPHOS 77  BILITOT 1.1   ------------------------------------------------------------------------------------------------------------------ CrCl cannot be calculated (Unknown ideal weight.). ------------------------------------------------------------------------------------------------------------------ No results for input(s): TSH, T4TOTAL, T3FREE, THYROIDAB in the last 72 hours.  Invalid input(s): FREET3   Coagulation profile No results for input(s): INR, PROTIME in the last 168  hours. ------------------------------------------------------------------------------------------------------------------- No results for input(s): DDIMER in the last 72 hours. -------------------------------------------------------------------------------------------------------------------  Cardiac Enzymes No results for input(s): CKMB, TROPONINI, MYOGLOBIN in the last 168 hours.  Invalid input(s): CK ------------------------------------------------------------------------------------------------------------------ Invalid input(s): POCBNP  ---------------------------------------------------------------------------------------------------------------  Urinalysis    Component Value Date/Time   COLORURINE COLORLESS (A) 03/29/2018 0733   APPEARANCEUR CLEAR (A) 03/29/2018 0733   LABSPEC 1.028 03/29/2018 0733   PHURINE 7.0 03/29/2018 0733   GLUCOSEU NEGATIVE 03/29/2018 0733   HGBUR NEGATIVE 03/29/2018 0733   BILIRUBINUR NEGATIVE 03/29/2018 0733   KETONESUR NEGATIVE 03/29/2018 0733   PROTEINUR NEGATIVE 03/29/2018 0733   NITRITE NEGATIVE 03/29/2018 0733   LEUKOCYTESUR NEGATIVE 03/29/2018 0733     RADIOLOGY: Ct Abdomen Pelvis W Contrast  Result Date: 03/29/2018 CLINICAL DATA:  Left lower abdominal pain, nausea, vomiting EXAM: CT ABDOMEN AND PELVIS WITH CONTRAST TECHNIQUE: Multidetector CT imaging of the abdomen and pelvis was performed using the standard protocol following bolus administration of intravenous contrast. CONTRAST:  157mL OMNIPAQUE IOHEXOL 300 MG/ML  SOLN COMPARISON:  04/02/2017 FINDINGS: Lower chest: Subpleural nodule in the left lower lobe measures 6 mm, stable. Increasing opacity in the right lower lobe at the right base could reflect atelectasis or developing pneumonia. No effusions. Heart  is borderline in size. Hepatobiliary: Mild diffuse low-density compatible with fatty infiltration. No focal hepatic abnormality. Gallbladder unremarkable. Pancreas: Extensive pancreatic  calcifications compatible with chronic pancreatitis. Ductal dilatation in the pancreatic tail. Questionable slight/early stranding around the pancreatic body and tail. Spleen: No focal abnormality.  Normal size. Adrenals/Urinary Tract: Scarring in the mid and lower pole of the right kidney. Small left adrenal nodule measures up to 1.7 cm, stable. No renal mass or hydronephrosis. Urinary bladder unremarkable. Stomach/Bowel: Normal appendix. Stomach, large and small bowel grossly unremarkable. Vascular/Lymphatic: Diffuse aortic and iliac calcifications. No aneurysm or adenopathy. Reproductive: No visible focal abnormality. Other: No free fluid or free air. Musculoskeletal: No acute bony abnormality. IMPRESSION: Changes of chronic pancreatitis, stable. There may be slight stranding around the pancreatic body and tail suggesting early pancreatitis. Recommend clinical correlation. Hepatic steatosis. Diffuse aortoiliac atherosclerosis. Mild airspace disease in the posterior right lower lobe at the right lung base could reflect atelectasis or early infiltrate/pneumonia. Electronically Signed   By: Rolm Baptise M.D.   On: 03/29/2018 07:08    EKG: Orders placed or performed during the hospital encounter of 04/29/16  . EKG 12-Lead  . EKG 12-Lead    IMPRESSION AND PLAN: Patient is 52 year old with history of chronic pancreatitis presenting with abdominal pain  1.  Acute on chronic pancreatitis Keep n.p.o. Give IV fluids Pain control Repeat lipase in the morning  2.  Essential hypertension continue Norvasc  3.  Hyperlipidemia continue Lipitor  4.  Depression continue Wellbutrin  5.  GERD continue Zantac  6.  Nicotine abuse smoking cessation provided 4 minutes spent patient offered nicotine patch she does not want a nicotine patch   All the records are reviewed and case discussed with ED provider. Management plans discussed with the patient, family and they are in agreement.  CODE STATUS: Code  Status History    Date Active Date Inactive Code Status Order ID Comments User Context   04/03/2017 0314 04/04/2017 1741 Full Code [id number]  Saundra Shelling, MD Inpatient   04/29/2016 1122 04/30/2016 1847 Full Code [id number]  Hillary Bow, MD ED       TOTAL TIME TAKING CARE OF THIS PATIENT: 55 minutes.    Dustin Flock M.D on 03/29/2018 at 9:37 AM  Between 7am to 6pm - Pager - [phone]  After 6pm go to [email or web address] - password [email or web address] [facility]  [employer] [facility] [facility]  [phone]  CC: Primary care physician; Patient, No Pcp Per

## 2018-03-29 NOTE — ED Notes (Signed)
Report called to Stephanie RN.

## 2018-03-29 NOTE — ED Notes (Signed)
Pt resting. Respirations even and unlabored. NAD. Stretcher in low and locked position. Call bell in reach. Denies needs at this time. Inpatient MD at bedside at this time. Will continue to monitor.

## 2018-03-29 NOTE — ED Triage Notes (Signed)
Pt arrived via EMS from home where pt has been experiencing left lower abdominal pain x2 hours with N/V. Pt has hx/o pancreatitis and reports symptoms similar.

## 2018-03-29 NOTE — ED Notes (Signed)
Pt resting. Respirations even and unlabored. NAD. Stretcher in low and locked position. Call bell in reach. Denies needs at this time. Will continue to monitor. 

## 2018-03-29 NOTE — ED Notes (Signed)
Assumed care of pt at this time. Pt resting. Respirations even and unlabored. NAD. Stretcher in low and locked position. Call bell in reach. Denies needs at this time. States that the morphine helped his pain a small amount. He states that he just voided and does not feel that he can provide a urine sample at this time. Provided him with cup to give sample when he is able. Will continue to monitor.

## 2018-03-29 NOTE — ED Provider Notes (Signed)
[facility] Emergency Department Provider Note   ____________________________________________   First MD Initiated Contact with Patient 03/29/18 [phone]     (approximate)  I have reviewed the triage vital signs and the nursing notes.   HISTORY  Chief Complaint Abdominal Pain    HPI Noah Velasquez is a 52 y.o. male who comes into the hospital today with some abdominal pain.  The patient has a history of pancreatitis and reports that this feels like his pancreatitis.  The symptoms started a few hours before he came in.  The patient states that he last drank a week ago.  The pain is left-sided.  The patient typically has naproxen at home but did not have any so he did not take anything for his symptoms.  The patient is also been nauseous and is dry heaving.  The patient rates his pain a 10 out of 10 in intensity.  He felt warm earlier but did not take his temperature.  His last pancreatitis flare was about 7 to 8 months ago.  The patient is feeling dizzy and lightheaded but denies any chest pain or shortness of breath.  He tried to wait it out at home but states that he could not so he came in for evaluation and treatment.   Past Medical History:  Diagnosis Date  . Chronic hip pain   . Chronic pancreatitis ([place])   . Hypertension   . Pancreatitis     Patient Active Problem List   Diagnosis Date Noted  . Acute renal failure (ARF) ([place]) 04/03/2017  . Pancreatitis 04/29/2016    Past Surgical History:  Procedure Laterality Date  . pancreatic stent placement      Prior to Admission medications   Medication Sig Start Date End Date Taking? Authorizing Provider  atorvastatin (LIPITOR) 40 MG tablet Take 40 mg by mouth daily.    [provider]  furosemide (LASIX) 20 MG tablet Take 20 mg by mouth daily.    [provider]  gabapentin (NEURONTIN) 100 MG capsule Take 100-200 mg by mouth at bedtime.    [provider]  losartan (COZAAR)  100 MG tablet Take 100 mg by mouth daily.    [provider]  Pancrelipase, Lip-Prot-Amyl, (ZENPEP) 15000 units CPEP Take 1 capsule by mouth 3 (three) times daily. With meals    [provider]  ranitidine (ZANTAC) 150 MG tablet Take 150 mg by mouth 2 (two) times daily.    [provider]  sertraline (ZOLOFT) 100 MG tablet Take 100 mg by mouth. 03/11/16 04/03/17  [provider]  traZODone (DESYREL) 150 MG tablet Take 1 tablet by mouth at bedtime as needed for sleep.  03/11/16   [provider]    Allergies Hctz [hydrochlorothiazide]  Family History  Problem Relation Age of Onset  . Diabetes Mother   . Sleep apnea Mother   . CAD Neg Hx     Social History Social History   Tobacco Use  . Smoking status: Current Every Day Smoker    Packs/day: 0.50    Types: Cigarettes  . Smokeless tobacco: Never Used  Substance Use Topics  . Alcohol use: Yes    Comment: usually none, but started back drinking 12 pack last week  . Drug use: Yes    Types: Marijuana    Review of Systems  Constitutional: No fever/chills Eyes: No visual changes. ENT: No sore throat. Cardiovascular: Denies chest pain. Respiratory: Denies shortness of breath. Gastrointestinal: No abdominal pain.  No  nausea, no vomiting.  No diarrhea.  No constipation. Genitourinary: Negative for dysuria. Musculoskeletal: Negative for back pain. Skin: Negative for rash. Neurological: Negative for headaches, focal weakness or numbness.   ____________________________________________   PHYSICAL EXAM:  VITAL SIGNS: ED Triage Vitals [03/29/18 0535]  Enc Vitals Group     BP (!) 182/105     Pulse Rate (!) 112     Resp 20     Temp 98.1 F (36.7 C)     Temp src      SpO2 95 %     Weight 184 lb 1.4 oz (83.5 kg)     Height      Head Circumference      Peak Flow      Pain Score      Pain Loc      Pain Edu?      Excl. in [place]?     Constitutional: Alert and oriented. Well appearing  and in moderate distress. Eyes: Conjunctivae are normal. PERRL. EOMI. Head: Atraumatic. Nose: No congestion/rhinnorhea. Mouth/Throat: Mucous membranes are moist.  Oropharynx non-erythematous. Cardiovascular: Normal rate, regular rhythm. Grossly normal heart sounds.  Good peripheral circulation. Respiratory: Normal respiratory effort.  No retractions. Lungs CTAB. Gastrointestinal: Soft with some LLQ abd pain to palpation. No distention.  Positive bowel sounds Musculoskeletal: No lower extremity tenderness nor edema.  . Neurologic:  Normal speech and language.  Skin:  Skin is warm, dry and intact.  Psychiatric: Mood and affect are normal.   ____________________________________________   LABS (all labs ordered are listed, but only abnormal results are displayed)  Labs Reviewed  LIPASE, BLOOD - Abnormal; Notable for the following components:      Result Value   Lipase 146 (*)    All other components within normal limits  COMPREHENSIVE METABOLIC PANEL - Abnormal; Notable for the following components:   Glucose, Bld 137 (*)    Creatinine, Ser 1.25 (*)    Calcium 8.8 (*)    Total Protein 8.3 (*)    All other components within normal limits  CBC - Abnormal; Notable for the following components:   WBC 13.6 (*)    All other components within normal limits  URINALYSIS, COMPLETE (UACMP) WITH MICROSCOPIC - Abnormal; Notable for the following components:   Color, Urine COLORLESS (*)    APPearance CLEAR (*)    All other components within normal limits  ETHANOL   ____________________________________________  EKG  none ____________________________________________  RADIOLOGY  ED MD interpretation:  CT abd and pelvis: changes of chronic pancreatitis stable, There may be slight stranding around the pancreatitis body and tail suggesting early pancreatitis. Recommend clinical correlation. Mild airspace disease in the posterior right lower lobe at the right lung base.  Official radiology  report(s): Ct Abdomen Pelvis W Contrast  Result Date: 03/29/2018 CLINICAL DATA:  Left lower abdominal pain, nausea, vomiting EXAM: CT ABDOMEN AND PELVIS WITH CONTRAST TECHNIQUE: Multidetector CT imaging of the abdomen and pelvis was performed using the standard protocol following bolus administration of intravenous contrast. CONTRAST:  125mL OMNIPAQUE IOHEXOL 300 MG/ML  SOLN COMPARISON:  04/02/2017 FINDINGS: Lower chest: Subpleural nodule in the left lower lobe measures 6 mm, stable. Increasing opacity in the right lower lobe at the right base could reflect atelectasis or developing pneumonia. No effusions. Heart is borderline in size. Hepatobiliary: Mild diffuse low-density compatible with fatty infiltration. No focal hepatic abnormality. Gallbladder unremarkable. Pancreas: Extensive pancreatic calcifications compatible with chronic pancreatitis. Ductal dilatation in the pancreatic tail. Questionable slight/early stranding around the pancreatic  body and tail. Spleen: No focal abnormality.  Normal size. Adrenals/Urinary Tract: Scarring in the mid and lower pole of the right kidney. Small left adrenal nodule measures up to 1.7 cm, stable. No renal mass or hydronephrosis. Urinary bladder unremarkable. Stomach/Bowel: Normal appendix. Stomach, large and small bowel grossly unremarkable. Vascular/Lymphatic: Diffuse aortic and iliac calcifications. No aneurysm or adenopathy. Reproductive: No visible focal abnormality. Other: No free fluid or free air. Musculoskeletal: No acute bony abnormality. IMPRESSION: Changes of chronic pancreatitis, stable. There may be slight stranding around the pancreatic body and tail suggesting early pancreatitis. Recommend clinical correlation. Hepatic steatosis. Diffuse aortoiliac atherosclerosis. Mild airspace disease in the posterior right lower lobe at the right lung base could reflect atelectasis or early infiltrate/pneumonia. Electronically Signed   By: Rolm Baptise M.D.   On:  03/29/2018 07:08    ____________________________________________   PROCEDURES  Procedure(s) performed: None  Procedures  Critical Care performed: No  ____________________________________________   INITIAL IMPRESSION / ASSESSMENT AND PLAN / ED COURSE  As part of my medical decision making, I reviewed the following data within the electronic MEDICAL RECORD NUMBER Notes from prior ED visits and [place] Controlled Substance Database   This is a 52 year old male who comes into the hospital today with some abdominal pain.  The patient feels that this is his pancreatitis but it is in the left lower quadrant.  I will give the patient a liter of normal saline, morphine and some Zofran.  We will check some blood work to include a CBC, CMP, lipase and ethanol.  The patient will be reassessed.  The patient does have a lipase of 146 and his white blood cell count is 13.6.  The patient CT scan does show some stranding in the tail the pancreas which is consistent with pancreatitis.  He will be admitted to the hospitalist service.  The patient's care will be signed out to Dr. Joni Fears who will disposition the patient to the hospital.      ____________________________________________   FINAL CLINICAL IMPRESSION(S) / ED DIAGNOSES  Final diagnoses:  Chronic pancreatitis, unspecified pancreatitis type ([place])  Nausea and vomiting, intractability of vomiting not specified, unspecified vomiting type     ED Discharge Orders    None       Note:  This document was prepared using Dragon voice recognition software and may include unintentional dictation errors.    Loney Hering, MD 03/29/18 [phone]

## 2018-03-30 LAB — BASIC METABOLIC PANEL
ANION GAP: 9 (ref 5–15)
BUN: 10 mg/dL (ref 6–20)
CHLORIDE: 100 mmol/L (ref 98–111)
CO2: 24 mmol/L (ref 22–32)
Calcium: 8.8 mg/dL — ABNORMAL LOW (ref 8.9–10.3)
Creatinine, Ser: 0.94 mg/dL (ref 0.61–1.24)
GFR calc non Af Amer: >60 mL/min (ref >60)
Glucose, Bld: 137 mg/dL — ABNORMAL HIGH (ref 70–99)
POTASSIUM: 3.7 mmol/L (ref 3.5–5.1)
SODIUM: 133 mmol/L — AB (ref 135–145)

## 2018-03-30 LAB — CBC
HCT: 41.6 % (ref 40.0–52.0)
HEMOGLOBIN: 14.3 g/dL (ref 13.0–18.0)
MCH: 31.4 pg (ref 26.0–34.0)
MCHC: 34.3 g/dL (ref 32.0–36.0)
MCV: 91.7 fL (ref 80.0–100.0)
Platelets: 253 10*3/uL (ref 150–440)
RBC: 4.54 MIL/uL (ref 4.40–5.90)
RDW: 14.2 % (ref 11.5–14.5)
WBC: 13.5 10*3/uL — ABNORMAL HIGH (ref 3.8–10.6)

## 2018-03-30 LAB — LIPASE, BLOOD: LIPASE: 42 U/L (ref 11–51)

## 2018-03-30 MED ORDER — HYDRALAZINE HCL 20 MG/ML IJ SOLN: 10.0000 mg | Freq: Four times a day (QID) | INTRAMUSCULAR | Status: DC | PRN

## 2018-03-30 MED ORDER — HYDRALAZINE HCL 50 MG PO TABS
50.0000 mg | ORAL_TABLET | Freq: Three times a day (TID) | ORAL | Status: DC
  Administered 2018-03-30 – 2018-03-31 (×3): 50 mg via ORAL
  Filled 2018-03-30 (×3): qty 1

## 2018-03-30 NOTE — Progress Notes (Signed)
[place] at [facility]                                                                                                                                                                                  Patient Demographics   Noah Velasquez, is a 52 y.o. male, DOB - 30-Sep-1965, VOZ:[id number]  Admit date - 03/29/2018   Admitting Physician Dustin Flock, MD  Outpatient Primary MD for the patient is Patient, No Pcp Per   LOS - 1  Subjective: Continues to complain of pain on the abdomen denies any nausea or vomiting   Review of Systems:   CONSTITUTIONAL: No documented fever. No fatigue, weakness. No weight gain, no weight loss.  EYES: No blurry or double vision.  ENT: No tinnitus. No postnasal drip. No redness of the oropharynx.  RESPIRATORY: No cough, no wheeze, no hemoptysis. No dyspnea.  CARDIOVASCULAR: No chest pain. No orthopnea. No palpitations. No syncope.  GASTROINTESTINAL: No nausea, no vomiting or diarrhea.  Positive abdominal pain. No melena or hematochezia.  GENITOURINARY: No dysuria or hematuria.  ENDOCRINE: No polyuria or nocturia. No heat or cold intolerance.  HEMATOLOGY: No anemia. No bruising. No bleeding.  INTEGUMENTARY: No rashes. No lesions.  MUSCULOSKELETAL: No arthritis. No swelling. No gout.  NEUROLOGIC: No numbness, tingling, or ataxia. No seizure-type activity.  PSYCHIATRIC: No anxiety. No insomnia. No ADD.    Vitals:   Vitals:   03/29/18 2307 03/30/18 0104 03/30/18 0348 03/30/18 0815  BP: (!) 183/104 (!) 180/99 (!) 144/92 (!) 175/103  Pulse: 89 83 87 93  Resp:   16 20  Temp:   97.8 F (36.6 C) 98.2 F (36.8 C)  TempSrc:   Oral Oral  SpO2: 96% 95% 93% 94%  Weight:      Height:        Wt Readings from Last 3 Encounters:  03/29/18 82.1 kg  04/03/17 83.5 kg  04/30/16 81.6 kg     Intake/Output Summary (Last 24 hours) at 03/30/2018 1259 Last data filed at 03/30/2018 0800 Gross per 24 hour  Intake 2118.75 ml  Output  1675 ml  Net 443.75 ml    Physical Exam:   GENERAL: Pleasant-appearing in no apparent distress.  HEAD, EYES, EARS, NOSE AND THROAT: Atraumatic, normocephalic. Extraocular muscles are intact. Pupils equal and reactive to light. Sclerae anicteric. No conjunctival injection. No oro-pharyngeal erythema.  NECK: Supple. There is no jugular venous distention. No bruits, no lymphadenopathy, no thyromegaly.  HEART: Regular rate and rhythm,. No murmurs, no rubs, no clicks.  LUNGS: Clear to auscultation bilaterally. No rales or rhonchi. No wheezes.  ABDOMEN: Soft, flat, nontender, nondistended. Has good bowel sounds. No hepatosplenomegaly appreciated.  EXTREMITIES: No evidence of any cyanosis, clubbing, or peripheral edema.  +2 pedal and radial pulses bilaterally.  NEUROLOGIC: The patient is alert, awake, and oriented x3 with no focal motor or sensory deficits appreciated bilaterally.  SKIN: Moist and warm with no rashes appreciated.  Psych: Not anxious, depressed LN: No inguinal LN enlargement    Antibiotics   Anti-infectives (From admission, onward)   None      Medications   Scheduled Meds: . amLODipine  10 mg Oral Daily  . atorvastatin  40 mg Oral Daily  . buPROPion  150 mg Oral Daily  . enoxaparin (LOVENOX) injection  40 mg Subcutaneous Q24H  . lipase/protease/amylase  12,000 Units Oral TID WC  . loratadine  10 mg Oral Daily  . nicotine  21 mg Transdermal Daily  . sertraline  100 mg Oral Daily   Continuous Infusions: . sodium chloride 125 mL/hr at 03/30/18 0553   PRN Meds:.acetaminophen **OR** acetaminophen, famotidine, fluticasone, gabapentin, hydrALAZINE, HYDROcodone-acetaminophen, labetalol, morphine injection, ondansetron **OR** ondansetron (ZOFRAN) IV, traZODone   Data Review:   Micro Results No results found for this or any previous visit (from the past 240 hour(s)).  Radiology Reports Ct Abdomen Pelvis W Contrast  Result Date: 03/29/2018 CLINICAL DATA:  Left lower  abdominal pain, nausea, vomiting EXAM: CT ABDOMEN AND PELVIS WITH CONTRAST TECHNIQUE: Multidetector CT imaging of the abdomen and pelvis was performed using the standard protocol following bolus administration of intravenous contrast. CONTRAST:  162mL OMNIPAQUE IOHEXOL 300 MG/ML  SOLN COMPARISON:  04/02/2017 FINDINGS: Lower chest: Subpleural nodule in the left lower lobe measures 6 mm, stable. Increasing opacity in the right lower lobe at the right base could reflect atelectasis or developing pneumonia. No effusions. Heart is borderline in size. Hepatobiliary: Mild diffuse low-density compatible with fatty infiltration. No focal hepatic abnormality. Gallbladder unremarkable. Pancreas: Extensive pancreatic calcifications compatible with chronic pancreatitis. Ductal dilatation in the pancreatic tail. Questionable slight/early stranding around the pancreatic body and tail. Spleen: No focal abnormality.  Normal size. Adrenals/Urinary Tract: Scarring in the mid and lower pole of the right kidney. Small left adrenal nodule measures up to 1.7 cm, stable. No renal mass or hydronephrosis. Urinary bladder unremarkable. Stomach/Bowel: Normal appendix. Stomach, large and small bowel grossly unremarkable. Vascular/Lymphatic: Diffuse aortic and iliac calcifications. No aneurysm or adenopathy. Reproductive: No visible focal abnormality. Other: No free fluid or free air. Musculoskeletal: No acute bony abnormality. IMPRESSION: Changes of chronic pancreatitis, stable. There may be slight stranding around the pancreatic body and tail suggesting early pancreatitis. Recommend clinical correlation. Hepatic steatosis. Diffuse aortoiliac atherosclerosis. Mild airspace disease in the posterior right lower lobe at the right lung base could reflect atelectasis or early infiltrate/pneumonia. Electronically Signed   By: Rolm Baptise M.D.   On: 03/29/2018 07:08     CBC Recent Labs  Lab 03/29/18 0536 03/30/18 0414  WBC 13.6* 13.5*  HGB  14.9 14.3  HCT 43.4 41.6  PLT 251 253  MCV 91.7 91.7  MCH 31.4 31.4  MCHC 34.3 34.3  RDW 14.0 14.2    Chemistries  Recent Labs  Lab 03/29/18 0536 03/30/18 0414  NA 135 133*  K 3.6 3.7  CL 104 100  CO2 22 24  GLUCOSE 137* 137*  BUN 15 10  CREATININE 1.25* 0.94  CALCIUM 8.8* 8.8*  AST 23  --   ALT 11  --   ALKPHOS 77  --   BILITOT 1.1  --    ------------------------------------------------------------------------------------------------------------------ estimated creatinine clearance is 96.1 mL/min (by C-G formula based  on SCr of 0.94 mg/dL). ------------------------------------------------------------------------------------------------------------------ No results for input(s): HGBA1C in the last 72 hours. ------------------------------------------------------------------------------------------------------------------ No results for input(s): CHOL, HDL, LDLCALC, TRIG, CHOLHDL, LDLDIRECT in the last 72 hours. ------------------------------------------------------------------------------------------------------------------ No results for input(s): TSH, T4TOTAL, T3FREE, THYROIDAB in the last 72 hours.  Invalid input(s): FREET3 ------------------------------------------------------------------------------------------------------------------ No results for input(s): VITAMINB12, FOLATE, FERRITIN, TIBC, IRON, RETICCTPCT in the last 72 hours.  Coagulation profile No results for input(s): INR, PROTIME in the last 168 hours.  No results for input(s): DDIMER in the last 72 hours.  Cardiac Enzymes No results for input(s): CKMB, TROPONINI, MYOGLOBIN in the last 168 hours.  Invalid input(s): CK ------------------------------------------------------------------------------------------------------------------ Invalid input(s): [place]  Patient is 52 year old with history of chronic pancreatitis presenting with abdominal pain  1.  Acute on chronic  pancreatitis Keep n.p.o. Continue pain control Lipase now normal but still having pain Start clear liquids in the morning  2.  Essential hypertension blood pressure elevated start patient on scheduled hydralazine  3.  Hyperlipidemia continue Lipitor  4.  Depression continue Wellbutrin  5.  GERD continue Zantac  6.  Nicotine abuse smoking cessation provided 4 minutes spent patient offered nicotine patch she does not want a nicotine patch       Code Status Orders  (From admission, onward)         Start     Ordered   03/29/18 1110  Full code  Continuous     03/29/18 1109        Code Status History    Date Active Date Inactive Code Status Order ID Comments User Context   04/03/2017 0314 04/04/2017 1741 Full Code [id number]  Saundra Shelling, MD Inpatient   04/29/2016 1122 04/30/2016 1847 Full Code [id number]  Hillary Bow, MD ED           Consultsnone  DVT Prophylaxis  Lovenox   Lab Results  Component Value Date   PLT 253 03/30/2018     Time Spent in minutes   55min Greater than 50% of time spent in care coordination and counseling patient regarding the condition and plan of care.   Dustin Flock M.D on 03/30/2018 at 12:59 PM  Between 7am to 6pm - Pager - [phone]  After 6pm go to [email or web address] - Proofreader  [employer]   Office  [phone]

## 2018-03-30 NOTE — Progress Notes (Signed)
Care taken over from [place], [place].  Patient resting comfortably at this time.  Reminded him that he is NPO.  Clarise Cruz, RN, BSN

## 2018-03-31 ENCOUNTER — Inpatient Hospital Stay: Payer: Medicaid Other

## 2018-03-31 LAB — EXPECTORATED SPUTUM ASSESSMENT W GRAM STAIN, RFLX TO RESP C

## 2018-03-31 LAB — EXPECTORATED SPUTUM ASSESSMENT W REFEX TO RESP CULTURE

## 2018-03-31 LAB — STREP PNEUMONIAE URINARY ANTIGEN: STREP PNEUMO URINARY ANTIGEN: NEGATIVE

## 2018-03-31 MED ORDER — MORPHINE SULFATE (PF) 2 MG/ML IV SOLN
2.0000 mg | INTRAVENOUS | Status: DC | PRN
  Administered 2018-03-31 – 2018-04-04 (×11): 2 mg via INTRAVENOUS
  Filled 2018-03-31 (×11): qty 1

## 2018-03-31 MED ORDER — SODIUM CHLORIDE 0.9 % IV SOLN
1.0000 g | INTRAVENOUS | Status: DC
  Administered 2018-03-31 – 2018-04-01 (×2): 1 g via INTRAVENOUS
  Filled 2018-03-31 (×2): qty 1

## 2018-03-31 MED ORDER — CHLORPROMAZINE HCL 50 MG PO TABS
50.0000 mg | ORAL_TABLET | Freq: Three times a day (TID) | ORAL | Status: DC | PRN
  Administered 2018-03-31 – 2018-04-02 (×6): 50 mg via ORAL
  Filled 2018-03-31 (×7): qty 1

## 2018-03-31 MED ORDER — SODIUM CHLORIDE 0.9 % IV SOLN
500.0000 mg | INTRAVENOUS | Status: DC
  Administered 2018-03-31 – 2018-04-01 (×2): 500 mg via INTRAVENOUS
  Filled 2018-03-31 (×2): qty 500

## 2018-03-31 MED ORDER — HYDRALAZINE HCL 50 MG PO TABS
100.0000 mg | ORAL_TABLET | Freq: Three times a day (TID) | ORAL | Status: DC
  Administered 2018-03-31 – 2018-04-04 (×11): 100 mg via ORAL
  Filled 2018-03-31 (×11): qty 2

## 2018-03-31 NOTE — Progress Notes (Signed)
[name] at [facility]                                                                                                                                                                                  Patient Demographics   Noah Velasquez, is a 52 y.o. male, DOB - Dec 18, 1965, QIH:[id number]  Admit date - 03/29/2018   Admitting Physician Noah Flock, MD  Outpatient Primary MD for the patient is Noah Velasquez   LOS - 2  Subjective: Patient reports abdominal pain improved. Overnight patient started having fevers chest x-ray suggestive of pneumonia   Review of Systems:   CONSTITUTIONAL: No documented fever. No fatigue, weakness. No weight gain, no weight loss.  EYES: No blurry or double vision.  ENT: No tinnitus. No postnasal drip. No redness of the oropharynx.  RESPIRATORY: No cough, no wheeze, no hemoptysis. No dyspnea.  CARDIOVASCULAR: No chest pain. No orthopnea. No palpitations. No syncope.  GASTROINTESTINAL: No nausea, no vomiting or diarrhea.  Positive abdominal pain. No melena or hematochezia.  GENITOURINARY: No dysuria or hematuria.  ENDOCRINE: No polyuria or nocturia. No heat or cold intolerance.  HEMATOLOGY: No anemia. No bruising. No bleeding.  INTEGUMENTARY: No rashes. No lesions.  MUSCULOSKELETAL: No arthritis. No swelling. No gout.  NEUROLOGIC: No numbness, tingling, or ataxia. No seizure-type activity.  PSYCHIATRIC: No anxiety. No insomnia. No ADD.    Vitals:   Vitals:   03/31/18 0403 03/31/18 0441 03/31/18 0507 03/31/18 0659  BP: (!) 164/86 (!) 155/88  (!) 150/93  Pulse: (!) 109 (!) 111  (!) 107  Resp: 18 16    Temp: (!) 102.2 F (39 C) (!) 102.7 F (39.3 C)  99.8 F (37.7 C)  TempSrc: Oral Oral  Oral  SpO2: 98% 96%    Weight:   82.3 kg   Height:        Wt Readings from Last 3 Encounters:  03/31/18 82.3 kg  04/03/17 83.5 kg  04/30/16 81.6 kg     Intake/Output Summary (Last 24 hours) at 03/31/2018 1238 Last data  filed at 03/31/2018 1204 Gross Velasquez 24 hour  Intake 7263.34 ml  Output 1025 ml  Net 6238.34 ml    Physical Exam:   GENERAL: Pleasant-appearing in no apparent distress.  HEAD, EYES, EARS, NOSE AND THROAT: Atraumatic, normocephalic. Extraocular muscles are intact. Pupils equal and reactive to light. Sclerae anicteric. No conjunctival injection. No oro-pharyngeal erythema.  NECK: Supple. There is no jugular venous distention. No bruits, no lymphadenopathy, no thyromegaly.  HEART: Regular rate and rhythm,. No murmurs, no rubs, no clicks.  LUNGS: Clear to auscultation bilaterally. No rales or rhonchi. No wheezes.  ABDOMEN: Soft,  flat, nontender, nondistended. Has good bowel sounds. No hepatosplenomegaly appreciated.  EXTREMITIES: No evidence of any cyanosis, clubbing, or peripheral edema.  +2 pedal and radial pulses bilaterally.  NEUROLOGIC: The patient is alert, awake, and oriented x3 with no focal motor or sensory deficits appreciated bilaterally.  SKIN: Moist and warm with no rashes appreciated.  Psych: Not anxious, depressed LN: No inguinal LN enlargement    Antibiotics   Anti-infectives (From admission, onward)   Start     Dose/Rate Route Frequency Ordered Stop   03/31/18 0645  cefTRIAXone (ROCEPHIN) 1 g in sodium chloride 0.9 % 100 mL IVPB     1 g 200 mL/hr over 30 Minutes Intravenous Every 24 hours 03/31/18 0640     03/31/18 0645  azithromycin (ZITHROMAX) 500 mg in sodium chloride 0.9 % 250 mL IVPB     500 mg 250 mL/hr over 60 Minutes Intravenous Every 24 hours 03/31/18 0640        Medications   Scheduled Meds: . amLODipine  10 mg Oral Daily  . atorvastatin  40 mg Oral Daily  . buPROPion  150 mg Oral Daily  . enoxaparin (LOVENOX) injection  40 mg Subcutaneous Q24H  . hydrALAZINE  50 mg Oral Q8H  . lipase/protease/amylase  12,000 Units Oral TID WC  . loratadine  10 mg Oral Daily  . nicotine  21 mg Transdermal Daily  . sertraline  100 mg Oral Daily   Continuous  Infusions: . sodium chloride 125 mL/hr at 03/31/18 0742  . azithromycin 500 mg (03/31/18 1204)  . cefTRIAXone (ROCEPHIN)  IV Stopped (03/31/18 1053)   PRN Meds:.acetaminophen **OR** acetaminophen, chlorproMAZINE, famotidine, fluticasone, gabapentin, hydrALAZINE, HYDROcodone-acetaminophen, labetalol, morphine injection, ondansetron **OR** ondansetron (ZOFRAN) IV, traZODone   Data Review:   Micro Results No results found for this or any previous visit (from the past 240 hour(s)).  Radiology Reports Ct Abdomen Pelvis W Contrast  Result Date: 03/29/2018 CLINICAL DATA:  Left lower abdominal pain, nausea, vomiting EXAM: CT ABDOMEN AND PELVIS WITH CONTRAST TECHNIQUE: Multidetector CT imaging of the abdomen and pelvis was performed using the standard protocol following bolus administration of intravenous contrast. CONTRAST:  142mL OMNIPAQUE IOHEXOL 300 MG/ML  SOLN COMPARISON:  04/02/2017 FINDINGS: Lower chest: Subpleural nodule in the left lower lobe measures 6 mm, stable. Increasing opacity in the right lower lobe at the right base could reflect atelectasis or developing pneumonia. No effusions. Heart is borderline in size. Hepatobiliary: Mild diffuse low-density compatible with fatty infiltration. No focal hepatic abnormality. Gallbladder unremarkable. Pancreas: Extensive pancreatic calcifications compatible with chronic pancreatitis. Ductal dilatation in the pancreatic tail. Questionable slight/early stranding around the pancreatic body and tail. Spleen: No focal abnormality.  Normal size. Adrenals/Urinary Tract: Scarring in the mid and lower pole of the right kidney. Small left adrenal nodule measures up to 1.7 cm, stable. No renal mass or hydronephrosis. Urinary bladder unremarkable. Stomach/Bowel: Normal appendix. Stomach, large and small bowel grossly unremarkable. Vascular/Lymphatic: Diffuse aortic and iliac calcifications. No aneurysm or adenopathy. Reproductive: No visible focal abnormality. Other:  No free fluid or free air. Musculoskeletal: No acute bony abnormality. IMPRESSION: Changes of chronic pancreatitis, stable. There may be slight stranding around the pancreatic body and tail suggesting early pancreatitis. Recommend clinical correlation. Hepatic steatosis. Diffuse aortoiliac atherosclerosis. Mild airspace disease in the posterior right lower lobe at the right lung base could reflect atelectasis or early infiltrate/pneumonia. Electronically Signed   By: Rolm Baptise M.D.   On: 03/29/2018 07:08   Dg Chest Port 1 View  Result Date:  03/31/2018 CLINICAL DATA:  Fever. EXAM: PORTABLE CHEST 1 VIEW COMPARISON:  None. FINDINGS: No pneumothorax. The heart size not well evaluated due to portable technique. The heart size appears borderline to mildly enlarged on this study. The hila and mediastinum are normal. No pulmonary nodules or masses. No overt edema. No focal infiltrate. Mild opacity in the right base, likely atelectasis and vascular crowding. IMPRESSION: 1. Mild opacity in the right base is likely a mixture of vascular crowding and atelectasis. No suspicious focal infiltrate. No other acute abnormality noted. Electronically Signed   By: Dorise Bullion III M.D   On: 03/31/2018 07:23     CBC Recent Labs  Lab 03/29/18 0536 03/30/18 0414  WBC 13.6* 13.5*  HGB 14.9 14.3  HCT 43.4 41.6  PLT 251 253  MCV 91.7 91.7  MCH 31.4 31.4  MCHC 34.3 34.3  RDW 14.0 14.2    Chemistries  Recent Labs  Lab 03/29/18 0536 03/30/18 0414  NA 135 133*  K 3.6 3.7  CL 104 100  CO2 22 24  GLUCOSE 137* 137*  BUN 15 10  CREATININE 1.25* 0.94  CALCIUM 8.8* 8.8*  AST 23  --   ALT 11  --   ALKPHOS 77  --   BILITOT 1.1  --    ------------------------------------------------------------------------------------------------------------------ estimated creatinine clearance is 96.2 mL/min (by C-G formula based on SCr of 0.94  mg/dL). ------------------------------------------------------------------------------------------------------------------ No results for input(s): HGBA1C in the last 72 hours. ------------------------------------------------------------------------------------------------------------------ No results for input(s): CHOL, HDL, LDLCALC, TRIG, CHOLHDL, LDLDIRECT in the last 72 hours. ------------------------------------------------------------------------------------------------------------------ No results for input(s): TSH, T4TOTAL, T3FREE, THYROIDAB in the last 72 hours.  Invalid input(s): FREET3 ------------------------------------------------------------------------------------------------------------------ No results for input(s): VITAMINB12, FOLATE, FERRITIN, TIBC, IRON, RETICCTPCT in the last 72 hours.  Coagulation profile No results for input(s): INR, PROTIME in the last 168 hours.  No results for input(s): DDIMER in the last 72 hours.  Cardiac Enzymes No results for input(s): CKMB, TROPONINI, MYOGLOBIN in the last 168 hours.  Invalid input(s): CK ------------------------------------------------------------------------------------------------------------------ Invalid input(s): [place]  Patient is 52 year old with history of chronic pancreatitis presenting with abdominal pain  1.  Acute on chronic pancreatitis Improved start clear liquids Continue pain control   2.  Fever biotics for now changed to oral tomorrow  3.  Essential hypertension blood pressure improved  3.  Hyperlipidemia continue Lipitor  4.  Depression continue Wellbutrin  5.  GERD continue Zantac  6.  Nicotine abuse smoking cessation provided       Code Status Orders  (From admission, onward)         Start     Ordered   03/29/18 1110  Full code  Continuous     03/29/18 1109        Code Status History    Date Active Date Inactive Code Status Order ID Comments  User Context   04/03/2017 0314 04/04/2017 1741 Full Code [id number]  Saundra Shelling, MD Inpatient   04/29/2016 1122 04/30/2016 1847 Full Code [id number]  Hillary Bow, MD ED           Consultsnone  DVT Prophylaxis  Lovenox   Lab Results  Component Value Date   PLT 253 03/30/2018     Time Spent in minutes   9min Greater than 50% of time spent in care coordination and counseling patient regarding the condition and plan of care.   Noah Velasquez M.D on 03/31/2018 at 12:38 PM  Between 7am to 6pm - Pager - [phone]  After 6pm go to [email or web address] - Proofreader  [employer]   Office  [phone]

## 2018-03-31 NOTE — Progress Notes (Signed)
Febrile to 102 overnight. CXR appears to demonstrate RLL infiltrate (pending official Radiology read). Reviewed prior CT A/P, small RLL infiltrate appears to have been present at that time. No known recent hospitalizations. Ceftriaxone + Azithromycin, BCx x2, Sputum Cx, UStrep + ULegionella Ag. Also endorses worsening AP. Possibility exists of development of pancreatic abscess/pseudocyst/necrosis, however holding off on reimaging (CT A/P) for now, as pt recently received dose of IV contrast and ionizing radiation on 08/13 for CT A/P.   -P. Agam Tuohy (Nocturnist/Hospitalist)

## 2018-04-01 LAB — CBC WITH DIFFERENTIAL/PLATELET
Basophils Absolute: 0 10*3/uL (ref 0–0.1)
Basophils Relative: 0 %
EOS ABS: 0 10*3/uL (ref 0–0.7)
Eosinophils Relative: 0 %
HEMATOCRIT: 33.9 % — AB (ref 40.0–52.0)
HEMOGLOBIN: 11.9 g/dL — AB (ref 13.0–18.0)
LYMPHS ABS: 1.2 10*3/uL (ref 1.0–3.6)
LYMPHS PCT: 7 %
MCH: 31.6 pg (ref 26.0–34.0)
MCHC: 35.2 g/dL (ref 32.0–36.0)
MCV: 90 fL (ref 80.0–100.0)
Monocytes Absolute: 0.9 10*3/uL (ref 0.2–1.0)
Monocytes Relative: 6 %
NEUTROS ABS: 13.6 10*3/uL — AB (ref 1.4–6.5)
NEUTROS PCT: 87 %
Platelets: 210 10*3/uL (ref 150–440)
RBC: 3.76 MIL/uL — ABNORMAL LOW (ref 4.40–5.90)
RDW: 14 % (ref 11.5–14.5)
WBC: 15.7 10*3/uL — ABNORMAL HIGH (ref 3.8–10.6)

## 2018-04-01 LAB — COMPREHENSIVE METABOLIC PANEL
ALK PHOS: 52 U/L (ref 38–126)
ALT: 9 U/L (ref 0–44)
ANION GAP: 9 (ref 5–15)
AST: 14 U/L — ABNORMAL LOW (ref 15–41)
Albumin: 2.7 g/dL — ABNORMAL LOW (ref 3.5–5.0)
BILIRUBIN TOTAL: 1 mg/dL (ref 0.3–1.2)
BUN: 11 mg/dL (ref 6–20)
CALCIUM: 8 mg/dL — AB (ref 8.9–10.3)
CO2: 20 mmol/L — ABNORMAL LOW (ref 22–32)
CREATININE: 1.02 mg/dL (ref 0.61–1.24)
Chloride: 101 mmol/L (ref 98–111)
GFR calc non Af Amer: >60 mL/min (ref >60)
Glucose, Bld: 98 mg/dL (ref 70–99)
Potassium: 3.4 mmol/L — ABNORMAL LOW (ref 3.5–5.1)
Sodium: 130 mmol/L — ABNORMAL LOW (ref 135–145)
TOTAL PROTEIN: 6.9 g/dL (ref 6.5–8.1)

## 2018-04-01 LAB — LEGIONELLA PNEUMOPHILA SEROGP 1 UR AG: L. pneumophila Serogp 1 Ur Ag: NEGATIVE

## 2018-04-01 LAB — PROCALCITONIN
Procalcitonin: 2.06 ng/mL
Procalcitonin: 1.92 ng/mL

## 2018-04-01 LAB — RAPID HIV SCREEN (HIV 1/2 AB+AG)
HIV 1/2 Antibodies: NONREACTIVE
HIV-1 P24 ANTIGEN - HIV24: NONREACTIVE

## 2018-04-01 MED ORDER — SODIUM BICARBONATE 650 MG PO TABS
650.0000 mg | ORAL_TABLET | Freq: Two times a day (BID) | ORAL | Status: DC
  Administered 2018-04-01 – 2018-04-04 (×7): 650 mg via ORAL
  Filled 2018-04-01 (×10): qty 1

## 2018-04-01 MED ORDER — SODIUM CHLORIDE 0.9 % IV SOLN
3.0000 g | Freq: Four times a day (QID) | INTRAVENOUS | Status: DC
  Administered 2018-04-01 – 2018-04-04 (×11): 3 g via INTRAVENOUS
  Filled 2018-04-01 (×14): qty 3

## 2018-04-01 MED ORDER — LEVOFLOXACIN IN D5W 500 MG/100ML IV SOLN
500.0000 mg | INTRAVENOUS | Status: DC
  Administered 2018-04-01: 14:00:00 500 mg via INTRAVENOUS
  Filled 2018-04-01 (×2): qty 100

## 2018-04-01 MED ORDER — GUAIFENESIN ER 600 MG PO TB12
600.0000 mg | ORAL_TABLET | Freq: Two times a day (BID) | ORAL | Status: DC
  Administered 2018-04-01 – 2018-04-04 (×7): 600 mg via ORAL
  Filled 2018-04-01 (×7): qty 1

## 2018-04-01 MED ORDER — GUAIFENESIN-DM 100-10 MG/5ML PO SYRP
5.0000 mL | ORAL_SOLUTION | ORAL | Status: DC | PRN
  Administered 2018-04-02: 5 mL via ORAL
  Filled 2018-04-01 (×3): qty 5

## 2018-04-01 NOTE — Progress Notes (Addendum)
[name] at [facility]                                                                                                                                                                                  Patient Demographics   Noah Velasquez, is a 52 y.o. male, DOB - 01-16-1966, QIW:[id number]  Admit date - 03/29/2018   Admitting Physician Dustin Flock, MD  Outpatient Primary MD for the patient is Patient, No Pcp Per   LOS - 3  Subjective: patient is having cough and congestion overnight.  Also had fevers yesterday evening.   Review of Systems:   CONSTITUTIONAL: No documented fever. No fatigue, weakness. No weight gain, no weight loss.  EYES: No blurry or double vision.  ENT: No tinnitus. No postnasal drip. No redness of the oropharynx.  RESPIRATORY: Positive cough, no wheeze, no hemoptysis.  Positive dyspnea.  CARDIOVASCULAR: No chest pain. No orthopnea. No palpitations. No syncope.  GASTROINTESTINAL: No nausea, no vomiting or diarrhea.  Positive abdominal pain. No melena or hematochezia.  GENITOURINARY: No dysuria or hematuria.  ENDOCRINE: No polyuria or nocturia. No heat or cold intolerance.  HEMATOLOGY: No anemia. No bruising. No bleeding.  INTEGUMENTARY: No rashes. No lesions.  MUSCULOSKELETAL: No arthritis. No swelling. No gout.  NEUROLOGIC: No numbness, tingling, or ataxia. No seizure-type activity.  PSYCHIATRIC: No anxiety. No insomnia. No ADD.    Vitals:   Vitals:   03/31/18 0659 03/31/18 1850 03/31/18 1956 04/01/18 0549  BP: (!) 150/93 (!) 158/79 (!) 172/96 (!) 142/82  Pulse: (!) 107 (!) 127 (!) 126 (!) 109  Resp:   20 15  Temp: 99.8 F (37.7 C) (!) 101 F (38.3 C) 99 F (37.2 C) 98.9 F (37.2 C)  TempSrc: Oral Oral Oral Oral  SpO2:  91% 95% 95%  Weight:      Height:        Wt Readings from Last 3 Encounters:  03/31/18 82.3 kg  04/03/17 83.5 kg  04/30/16 81.6 kg     Intake/Output Summary (Last 24 hours) at 04/01/2018  1206 Last data filed at 04/01/2018 4174 Gross per 24 hour  Intake 3297.88 ml  Output 2000 ml  Net 1297.88 ml    Physical Exam:   GENERAL: Pleasant-appearing in no apparent distress.  HEAD, EYES, EARS, NOSE AND THROAT: Atraumatic, normocephalic. Extraocular muscles are intact. Pupils equal and reactive to light. Sclerae anicteric. No conjunctival injection. No oro-pharyngeal erythema.  NECK: Supple. There is no jugular venous distention. No bruits, no lymphadenopathy, no thyromegaly.  HEART: Regular rate and rhythm,. No murmurs, no rubs, no clicks.  LUNGS: Rhonchus breath sounds bilaterally ABDOMEN: Soft, flat, nontender, nondistended. Has good  bowel sounds. No hepatosplenomegaly appreciated.  EXTREMITIES: No evidence of any cyanosis, clubbing, or peripheral edema.  +2 pedal and radial pulses bilaterally.  NEUROLOGIC: The patient is alert, awake, and oriented x3 with no focal motor or sensory deficits appreciated bilaterally.  SKIN: Moist and warm with no rashes appreciated.  Psych: Not anxious, depressed LN: No inguinal LN enlargement    Antibiotics   Anti-infectives (From admission, onward)   Start     Dose/Rate Route Frequency Ordered Stop   04/01/18 1200  levofloxacin (LEVAQUIN) IVPB 500 mg     500 mg 100 mL/hr over 60 Minutes Intravenous Every 24 hours 04/01/18 1153     03/31/18 0645  cefTRIAXone (ROCEPHIN) 1 g in sodium chloride 0.9 % 100 mL IVPB  Status:  Discontinued     1 g 200 mL/hr over 30 Minutes Intravenous Every 24 hours 03/31/18 0640 04/01/18 1153   03/31/18 0645  azithromycin (ZITHROMAX) 500 mg in sodium chloride 0.9 % 250 mL IVPB  Status:  Discontinued     500 mg 250 mL/hr over 60 Minutes Intravenous Every 24 hours 03/31/18 0640 04/01/18 1153      Medications   Scheduled Meds: . amLODipine  10 mg Oral Daily  . atorvastatin  40 mg Oral Daily  . buPROPion  150 mg Oral Daily  . enoxaparin (LOVENOX) injection  40 mg Subcutaneous Q24H  . guaiFENesin  600 mg  Oral BID  . hydrALAZINE  100 mg Oral Q8H  . lipase/protease/amylase  12,000 Units Oral TID WC  . loratadine  10 mg Oral Daily  . nicotine  21 mg Transdermal Daily  . sertraline  100 mg Oral Daily  . sodium bicarbonate  650 mg Oral BID   Continuous Infusions: . sodium chloride 125 mL/hr at 04/01/18 0847  . levofloxacin (LEVAQUIN) IV     PRN Meds:.acetaminophen **OR** acetaminophen, chlorproMAZINE, famotidine, fluticasone, gabapentin, guaiFENesin-dextromethorphan, hydrALAZINE, HYDROcodone-acetaminophen, labetalol, morphine injection, ondansetron **OR** ondansetron (ZOFRAN) IV, traZODone   Data Review:   Micro Results Recent Results (from the past 240 hour(s))  CULTURE, BLOOD (ROUTINE X 2) w Reflex to ID Panel     Status: None (Preliminary result)   Collection Time: 03/31/18  4:51 AM  Result Value Ref Range Status   Specimen Description BLOOD RIGHT ASSIST CONTROL  Final   Special Requests   Final    BOTTLES DRAWN AEROBIC AND ANAEROBIC Blood Culture adequate volume   Culture   Final    NO GROWTH 1 DAY Performed at [facility], [address], [place], [place] [address]    Report Status PENDING  Incomplete  CULTURE, BLOOD (ROUTINE X 2) w Reflex to ID Panel     Status: None (Preliminary result)   Collection Time: 03/31/18  5:02 AM  Result Value Ref Range Status   Specimen Description BLOOD RIGHT HAND  Final   Special Requests   Final    BOTTLES DRAWN AEROBIC AND ANAEROBIC Blood Culture adequate volume   Culture   Final    NO GROWTH 1 DAY Performed at [facility], [address], [place], [place] [address]    Report Status PENDING  Incomplete  Expectorated sputum assessment w rflx to resp cult     Status: None   Collection Time: 03/31/18 12:07 PM  Result Value Ref Range Status   Specimen Description SPUTUM  Final   Special Requests NONE  Final   Sputum evaluation   Final    Sputum specimen not acceptable for testing.  Please recollect.  Performed at  [facility], [address] [place], [place] [address]    Report Status 03/31/2018 FINAL  Final    Radiology Reports Ct Abdomen Pelvis W Contrast  Result Date: 03/29/2018 CLINICAL DATA:  Left lower abdominal pain, nausea, vomiting EXAM: CT ABDOMEN AND PELVIS WITH CONTRAST TECHNIQUE: Multidetector CT imaging of the abdomen and pelvis was performed using the standard protocol following bolus administration of intravenous contrast. CONTRAST:  170mL OMNIPAQUE IOHEXOL 300 MG/ML  SOLN COMPARISON:  04/02/2017 FINDINGS: Lower chest: Subpleural nodule in the left lower lobe measures 6 mm, stable. Increasing opacity in the right lower lobe at the right base could reflect atelectasis or developing pneumonia. No effusions. Heart is borderline in size. Hepatobiliary: Mild diffuse low-density compatible with fatty infiltration. No focal hepatic abnormality. Gallbladder unremarkable. Pancreas: Extensive pancreatic calcifications compatible with chronic pancreatitis. Ductal dilatation in the pancreatic tail. Questionable slight/early stranding around the pancreatic body and tail. Spleen: No focal abnormality.  Normal size. Adrenals/Urinary Tract: Scarring in the mid and lower pole of the right kidney. Small left adrenal nodule measures up to 1.7 cm, stable. No renal mass or hydronephrosis. Urinary bladder unremarkable. Stomach/Bowel: Normal appendix. Stomach, large and small bowel grossly unremarkable. Vascular/Lymphatic: Diffuse aortic and iliac calcifications. No aneurysm or adenopathy. Reproductive: No visible focal abnormality. Other: No free fluid or free air. Musculoskeletal: No acute bony abnormality. IMPRESSION: Changes of chronic pancreatitis, stable. There may be slight stranding around the pancreatic body and tail suggesting early pancreatitis. Recommend clinical correlation. Hepatic steatosis. Diffuse aortoiliac atherosclerosis. Mild airspace disease in the posterior right lower lobe at the right  lung base could reflect atelectasis or early infiltrate/pneumonia. Electronically Signed   By: Rolm Baptise M.D.   On: 03/29/2018 07:08   Dg Chest Port 1 View  Result Date: 03/31/2018 CLINICAL DATA:  Fever. EXAM: PORTABLE CHEST 1 VIEW COMPARISON:  None. FINDINGS: No pneumothorax. The heart size not well evaluated due to portable technique. The heart size appears borderline to mildly enlarged on this study. The hila and mediastinum are normal. No pulmonary nodules or masses. No overt edema. No focal infiltrate. Mild opacity in the right base, likely atelectasis and vascular crowding. IMPRESSION: 1. Mild opacity in the right base is likely a mixture of vascular crowding and atelectasis. No suspicious focal infiltrate. No other acute abnormality noted. Electronically Signed   By: Dorise Bullion III M.D   On: 03/31/2018 07:23     CBC Recent Labs  Lab 03/29/18 0536 03/30/18 0414 04/01/18 0824  WBC 13.6* 13.5* 15.7*  HGB 14.9 14.3 11.9*  HCT 43.4 41.6 33.9*  PLT 251 253 210  MCV 91.7 91.7 90.0  MCH 31.4 31.4 31.6  MCHC 34.3 34.3 35.2  RDW 14.0 14.2 14.0  LYMPHSABS  --   --  1.2  MONOABS  --   --  0.9  EOSABS  --   --  0.0  BASOSABS  --   --  0.0    Chemistries  Recent Labs  Lab 03/29/18 0536 03/30/18 0414 04/01/18 0824  NA 135 133* 130*  K 3.6 3.7 3.4*  CL 104 100 101  CO2 22 24 20*  GLUCOSE 137* 137* 98  BUN 15 10 11   CREATININE 1.25* 0.94 1.02  CALCIUM 8.8* 8.8* 8.0*  AST 23  --  14*  ALT 11  --  9  ALKPHOS 77  --  52  BILITOT 1.1  --  1.0   ------------------------------------------------------------------------------------------------------------------ estimated creatinine clearance is 88.7 mL/min (by C-G formula  based on SCr of 1.02 mg/dL). ------------------------------------------------------------------------------------------------------------------ No results for input(s): HGBA1C in the last 72  hours. ------------------------------------------------------------------------------------------------------------------ No results for input(s): CHOL, HDL, LDLCALC, TRIG, CHOLHDL, LDLDIRECT in the last 72 hours. ------------------------------------------------------------------------------------------------------------------ No results for input(s): TSH, T4TOTAL, T3FREE, THYROIDAB in the last 72 hours.  Invalid input(s): FREET3 ------------------------------------------------------------------------------------------------------------------ No results for input(s): VITAMINB12, FOLATE, FERRITIN, TIBC, IRON, RETICCTPCT in the last 72 hours.  Coagulation profile No results for input(s): INR, PROTIME in the last 168 hours.  No results for input(s): DDIMER in the last 72 hours.  Cardiac Enzymes No results for input(s): CKMB, TROPONINI, MYOGLOBIN in the last 168 hours.  Invalid input(s): CK ------------------------------------------------------------------------------------------------------------------ Invalid input(s): [place]  Patient is 52 year old with history of chronic pancreatitis presenting with abdominal pain  1.  Acute on chronic pancreatitis Continue clear liquids Continue pain control   2.  Fever due to pneumonia I will change antibiotics to cover possible aspiration procalcitonin is high we will follow procalcitonin levels  3.  Hyponatremia patient currently on normal saline.  I will add sodium chloride to his current regimen Check serum and urine osmolality  4. Essential hypertension blood pressure improved  5..  Hyperlipidemia continue Lipitor  6  Depression continue Wellbutrin  7..  GERD continue Zantac  8  Nicotine abuse smoking cessation provided       Code Status Orders  (From admission, onward)         Start     Ordered   03/29/18 1110  Full code  Continuous     03/29/18 1109        Code Status History    Date  Active Date Inactive Code Status Order ID Comments User Context   04/03/2017 0314 04/04/2017 1741 Full Code [id number]  Saundra Shelling, MD Inpatient   04/29/2016 1122 04/30/2016 1847 Full Code [id number]  Hillary Bow, MD ED           Consultsnone  DVT Prophylaxis  Lovenox   Lab Results  Component Value Date   PLT 210 04/01/2018     Time Spent in minutes   45min Greater than 50% of time spent in care coordination and counseling patient regarding the condition and plan of care.   Dustin Flock M.D on 04/01/2018 at 12:06 PM  Between 7am to 6pm - Pager - [phone]  After 6pm go to [email or web address] - Proofreader  [employer]   Office  [phone]

## 2018-04-01 NOTE — Plan of Care (Signed)

## 2018-04-02 LAB — CBC WITH DIFFERENTIAL/PLATELET
BASOS ABS: 0 10*3/uL (ref 0–0.1)
BASOS PCT: 0 %
Eosinophils Absolute: 0.1 10*3/uL (ref 0–0.7)
Eosinophils Relative: 1 %
HEMATOCRIT: 32.9 % — AB (ref 40.0–52.0)
Hemoglobin: 11.5 g/dL — ABNORMAL LOW (ref 13.0–18.0)
LYMPHS PCT: 8 %
Lymphs Abs: 1.3 10*3/uL (ref 1.0–3.6)
MCH: 31.6 pg (ref 26.0–34.0)
MCHC: 35 g/dL (ref 32.0–36.0)
MCV: 90.3 fL (ref 80.0–100.0)
MONO ABS: 1.2 10*3/uL — AB (ref 0.2–1.0)
Monocytes Relative: 8 %
Neutro Abs: 13.4 10*3/uL — ABNORMAL HIGH (ref 1.4–6.5)
Neutrophils Relative %: 83 %
PLATELETS: 214 10*3/uL (ref 150–440)
RBC: 3.64 MIL/uL — AB (ref 4.40–5.90)
RDW: 13.6 % (ref 11.5–14.5)
WBC: 16 10*3/uL — AB (ref 3.8–10.6)

## 2018-04-02 LAB — BASIC METABOLIC PANEL
Anion gap: 8 (ref 5–15)
BUN: 9 mg/dL (ref 6–20)
CALCIUM: 8 mg/dL — AB (ref 8.9–10.3)
CO2: 22 mmol/L (ref 22–32)
Chloride: 101 mmol/L (ref 98–111)
Creatinine, Ser: 1.01 mg/dL (ref 0.61–1.24)
Glucose, Bld: 99 mg/dL (ref 70–99)
POTASSIUM: 3.2 mmol/L — AB (ref 3.5–5.1)
Sodium: 131 mmol/L — ABNORMAL LOW (ref 135–145)

## 2018-04-02 MED ORDER — OXYCODONE HCL ER 10 MG PO T12A
10.0000 mg | EXTENDED_RELEASE_TABLET | Freq: Two times a day (BID) | ORAL | Status: DC
  Administered 2018-04-02 – 2018-04-04 (×5): 10 mg via ORAL
  Filled 2018-04-02 (×5): qty 1

## 2018-04-02 NOTE — Progress Notes (Signed)
[facility] [facility]                                                                                                                                                                                  Patient Demographics   Noah Velasquez, is a 52 y.o. male, DOB - [birth year], BOF:[id number]  Admit date - 03/29/2018   Admitting Physician Dustin Flock, MD  Outpatient Primary MD for the patient is Patient, No Pcp Per   LOS - 4  Subjective: patient is having cough and congestion .  Also complaining of still significant pain in his abdomen.  Review of Systems:   CONSTITUTIONAL: No documented fever. No fatigue, weakness. No weight gain, no weight loss.  EYES: No blurry or double vision.  ENT: No tinnitus. No postnasal drip. No redness of the oropharynx.  RESPIRATORY: Positive cough, no wheeze, no hemoptysis.  Positive dyspnea.  CARDIOVASCULAR: No chest pain. No orthopnea. No palpitations. No syncope.  GASTROINTESTINAL: No nausea, no vomiting or diarrhea.  Positive abdominal pain. No melena or hematochezia.  GENITOURINARY: No dysuria or hematuria.  ENDOCRINE: No polyuria or nocturia. No heat or cold intolerance.  HEMATOLOGY: No anemia. No bruising. No bleeding.  INTEGUMENTARY: No rashes. No lesions.  MUSCULOSKELETAL: No arthritis. No swelling. No gout.  NEUROLOGIC: No numbness, tingling, or ataxia. No seizure-type activity.  PSYCHIATRIC: No anxiety. No insomnia. No ADD.    Vitals:   Vitals:   04/01/18 2007 04/02/18 0518 04/02/18 1243 04/02/18 1245  BP: 116/77 118/81 (!) 165/86 (!) 160/84  Pulse: (!) 108 99 (!) 113 (!) 111  Resp: 20 18 20    Temp: 99.2 F (37.3 C) 99.6 F (37.6 C) 98.7 F (37.1 C)   TempSrc: Oral Oral Oral   SpO2: 90% 93% 96%   Weight:      Height:        Wt Readings from Last 3 Encounters:  03/31/18 82.3 kg  04/03/17 83.5 kg  04/30/16 81.6 kg     Intake/Output Summary (Last 24 hours) at 04/02/2018 1422 Last data filed at  04/02/2018 1410 Gross per 24 hour  Intake 5931.66 ml  Output 2225 ml  Net 3706.66 ml    Physical Exam:   GENERAL: Pleasant-appearing in no apparent distress.  HEAD, EYES, EARS, NOSE AND THROAT: Atraumatic, normocephalic. Extraocular muscles are intact. Pupils equal and reactive to light. Sclerae anicteric. No conjunctival injection. No oro-pharyngeal erythema.  NECK: Supple. There is no jugular venous distention. No bruits, no lymphadenopathy, no thyromegaly.  HEART: Regular rate and rhythm,. No murmurs, no rubs, no clicks.  LUNGS: Rhonchus breath sounds bilaterally ABDOMEN: Soft, flat, tender, nondistended. Has good bowel sounds. No hepatosplenomegaly  appreciated.  EXTREMITIES: No evidence of any cyanosis, clubbing, or peripheral edema.  +2 pedal and radial pulses bilaterally.  NEUROLOGIC: The patient is alert, awake, and oriented x3 with no focal motor or sensory deficits appreciated bilaterally.  SKIN: Moist and warm with no rashes appreciated.  Psych: Not anxious, depressed LN: No inguinal LN enlargement    Antibiotics   Anti-infectives (From admission, onward)   Start     Dose/Rate Route Frequency Ordered Stop   04/01/18 1830  Ampicillin-Sulbactam (UNASYN) 3 g in sodium chloride 0.9 % 100 mL IVPB     3 g 200 mL/hr over 30 Minutes Intravenous Every 6 hours 04/01/18 1718     04/01/18 1200  levofloxacin (LEVAQUIN) IVPB 500 mg  Status:  Discontinued     500 mg 100 mL/hr over 60 Minutes Intravenous Every 24 hours 04/01/18 1153 04/01/18 1718   03/31/18 0645  cefTRIAXone (ROCEPHIN) 1 g in sodium chloride 0.9 % 100 mL IVPB  Status:  Discontinued     1 g 200 mL/hr over 30 Minutes Intravenous Every 24 hours 03/31/18 0640 04/01/18 1153   03/31/18 0645  azithromycin (ZITHROMAX) 500 mg in sodium chloride 0.9 % 250 mL IVPB  Status:  Discontinued     500 mg 250 mL/hr over 60 Minutes Intravenous Every 24 hours 03/31/18 0640 04/01/18 1153      Medications   Scheduled Meds: . amLODipine   10 mg Oral Daily  . atorvastatin  40 mg Oral Daily  . buPROPion  150 mg Oral Daily  . enoxaparin (LOVENOX) injection  40 mg Subcutaneous Q24H  . guaiFENesin  600 mg Oral BID  . hydrALAZINE  100 mg Oral Q8H  . lipase/protease/amylase  12,000 Units Oral TID WC  . loratadine  10 mg Oral Daily  . nicotine  21 mg Transdermal Daily  . oxyCODONE  10 mg Oral Q12H  . sertraline  100 mg Oral Daily  . sodium bicarbonate  650 mg Oral BID   Continuous Infusions: . sodium chloride 125 mL/hr at 04/02/18 0953  . ampicillin-sulbactam (UNASYN) IV 3 g (04/02/18 1309)   PRN Meds:.acetaminophen **OR** acetaminophen, chlorproMAZINE, famotidine, fluticasone, gabapentin, guaiFENesin-dextromethorphan, hydrALAZINE, HYDROcodone-acetaminophen, labetalol, morphine injection, ondansetron **OR** ondansetron (ZOFRAN) IV, traZODone   Data Review:   Micro Results Recent Results (from the past 240 hour(s))  CULTURE, BLOOD (ROUTINE X 2) w Reflex to ID Panel     Status: None (Preliminary result)   Collection Time: 03/31/18  4:51 AM  Result Value Ref Range Status   Specimen Description BLOOD RIGHT ASSIST CONTROL  Final   Special Requests   Final    BOTTLES DRAWN AEROBIC AND ANAEROBIC Blood Culture adequate volume   Culture   Final    NO GROWTH 2 DAYS Performed at [facility], [address], [place], [place] [address] [address]    Report Status PENDING  Incomplete  CULTURE, BLOOD (ROUTINE X 2) w Reflex to ID Panel     Status: None (Preliminary result)   Collection Time: 03/31/18  5:02 AM  Result Value Ref Range Status   Specimen Description BLOOD RIGHT HAND  Final   Special Requests   Final    BOTTLES DRAWN AEROBIC AND ANAEROBIC Blood Culture adequate volume   Culture   Final    NO GROWTH 2 DAYS Performed at [facility], [address] [place], [place] [address]    Report Status PENDING  Incomplete  Expectorated sputum assessment w rflx to resp cult     Status: None  Collection Time: 03/31/18  12:07 PM  Result Value Ref Range Status   Specimen Description SPUTUM  Final   Special Requests NONE  Final   Sputum evaluation   Final    Sputum specimen not acceptable for testing.  Please recollect.   Performed at [facility], [address] [place], [place] [address]    Report Status 03/31/2018 FINAL  Final    Radiology Reports Ct Abdomen Pelvis W Contrast  Result Date: 03/29/2018 CLINICAL DATA:  Left lower abdominal pain, nausea, vomiting EXAM: CT ABDOMEN AND PELVIS WITH CONTRAST TECHNIQUE: Multidetector CT imaging of the abdomen and pelvis was performed using the standard protocol following bolus administration of intravenous contrast. CONTRAST:  134mL OMNIPAQUE IOHEXOL 300 MG/ML  SOLN COMPARISON:  04/02/2017 FINDINGS: Lower chest: Subpleural nodule in the left lower lobe measures 6 mm, stable. Increasing opacity in the right lower lobe at the right base could reflect atelectasis or developing pneumonia. No effusions. Heart is borderline in size. Hepatobiliary: Mild diffuse low-density compatible with fatty infiltration. No focal hepatic abnormality. Gallbladder unremarkable. Pancreas: Extensive pancreatic calcifications compatible with chronic pancreatitis. Ductal dilatation in the pancreatic tail. Questionable slight/early stranding around the pancreatic body and tail. Spleen: No focal abnormality.  Normal size. Adrenals/Urinary Tract: Scarring in the mid and lower pole of the right kidney. Small left adrenal nodule measures up to 1.7 cm, stable. No renal mass or hydronephrosis. Urinary bladder unremarkable. Stomach/Bowel: Normal appendix. Stomach, large and small bowel grossly unremarkable. Vascular/Lymphatic: Diffuse aortic and iliac calcifications. No aneurysm or adenopathy. Reproductive: No visible focal abnormality. Other: No free fluid or free air. Musculoskeletal: No acute bony abnormality. IMPRESSION: Changes of chronic pancreatitis, stable. There may be slight stranding  around the pancreatic body and tail suggesting early pancreatitis. Recommend clinical correlation. Hepatic steatosis. Diffuse aortoiliac atherosclerosis. Mild airspace disease in the posterior right lower lobe at the right lung base could reflect atelectasis or early infiltrate/pneumonia. Electronically Signed   By: Rolm Baptise M.D.   On: 03/29/2018 07:08   Dg Chest Port 1 View  Result Date: 03/31/2018 CLINICAL DATA:  Fever. EXAM: PORTABLE CHEST 1 VIEW COMPARISON:  None. FINDINGS: No pneumothorax. The heart size not well evaluated due to portable technique. The heart size appears borderline to mildly enlarged on this study. The hila and mediastinum are normal. No pulmonary nodules or masses. No overt edema. No focal infiltrate. Mild opacity in the right base, likely atelectasis and vascular crowding. IMPRESSION: 1. Mild opacity in the right base is likely a mixture of vascular crowding and atelectasis. No suspicious focal infiltrate. No other acute abnormality noted. Electronically Signed   By: Dorise Bullion III M.D   On: 03/31/2018 07:23     CBC Recent Labs  Lab 03/29/18 0536 03/30/18 0414 04/01/18 0824 04/02/18 0335  WBC 13.6* 13.5* 15.7* 16.0*  HGB 14.9 14.3 11.9* 11.5*  HCT 43.4 41.6 33.9* 32.9*  PLT 251 253 210 214  MCV 91.7 91.7 90.0 90.3  MCH 31.4 31.4 31.6 31.6  MCHC 34.3 34.3 35.2 35.0  RDW 14.0 14.2 14.0 13.6  LYMPHSABS  --   --  1.2 1.3  MONOABS  --   --  0.9 1.2*  EOSABS  --   --  0.0 0.1  BASOSABS  --   --  0.0 0.0    Chemistries  Recent Labs  Lab 03/29/18 0536 03/30/18 0414 04/01/18 0824 04/02/18 0335  NA 135 133* 130* 131*  K 3.6 3.7 3.4* 3.2*  CL 104 100 101 101  CO2 22 24 20* 22  GLUCOSE 137* 137* 98 99  BUN 15 10 11 9   CREATININE 1.25* 0.94 1.02 1.01  CALCIUM 8.8* 8.8* 8.0* 8.0*  AST 23  --  14*  --   ALT 11  --  9  --   ALKPHOS 77  --  52  --   BILITOT 1.1  --  1.0  --     ------------------------------------------------------------------------------------------------------------------ estimated creatinine clearance is 89.5 mL/min (by C-G formula based on SCr of 1.01 mg/dL). ------------------------------------------------------------------------------------------------------------------ No results for input(s): HGBA1C in the last 72 hours. ------------------------------------------------------------------------------------------------------------------ No results for input(s): CHOL, HDL, LDLCALC, TRIG, CHOLHDL, LDLDIRECT in the last 72 hours. ------------------------------------------------------------------------------------------------------------------ No results for input(s): TSH, T4TOTAL, T3FREE, THYROIDAB in the last 72 hours.  Invalid input(s): FREET3 ------------------------------------------------------------------------------------------------------------------ No results for input(s): VITAMINB12, FOLATE, FERRITIN, TIBC, IRON, RETICCTPCT in the last 72 hours.  Coagulation profile No results for input(s): INR, PROTIME in the last 168 hours.  No results for input(s): DDIMER in the last 72 hours.  Cardiac Enzymes No results for input(s): CKMB, TROPONINI, MYOGLOBIN in the last 168 hours.  Invalid input(s): CK ------------------------------------------------------------------------------------------------------------------ Invalid input(s): [place]  Patient is 52 year old with history of chronic pancreatitis presenting with abdominal pain  1.  Acute on chronic pancreatitis Tolerating clear liquids-upgraded to full liquid. Continue pain control  2.  Fever due to pneumonia antibiotics to cover possible aspiration  procalcitonin is high we will follow procalcitonin levels  3.  Hyponatremia patient currently on normal saline.  Check serum and urine osmolality  4. Essential hypertension blood pressure improved  5..   Hyperlipidemia continue Lipitor  6  Depression continue Wellbutrin  7..  GERD continue Zantac  8  Nicotine abuse smoking cessation provided for 4 minutes.      Code Status Orders  (From admission, onward)         Start     Ordered   03/29/18 1110  Full code  Continuous     03/29/18 1109        Code Status History    Date Active Date Inactive Code Status Order ID Comments User Context   04/03/2017 0314 04/04/2017 1741 Full Code [id number]  Saundra Shelling, MD Inpatient   04/29/2016 1122 04/30/2016 1847 Full Code [id number]  Hillary Bow, MD ED         Consultsnone  DVT Prophylaxis  Lovenox   Lab Results  Component Value Date   PLT 214 04/02/2018     Time Spent in minutes   70min Greater than 50% of time spent in care coordination and counseling patient regarding the condition and plan of care.   Vaughan Basta M.D on 04/02/2018 at 2:22 PM  Between 7am to 6pm - Pager - [phone]  After 6pm go to [email or web address] - Proofreader  [employer]   Office  [phone]

## 2018-04-03 MED ORDER — POTASSIUM CHLORIDE CRYS ER 20 MEQ PO TBCR
20.0000 meq | EXTENDED_RELEASE_TABLET | Freq: Two times a day (BID) | ORAL | Status: DC
  Administered 2018-04-03 – 2018-04-04 (×3): 20 meq via ORAL
  Filled 2018-04-03 (×3): qty 1

## 2018-04-03 NOTE — Progress Notes (Signed)
[place] [facility]                                                                                                                                                                                  Patient Demographics   Noah Velasquez, is a 52 y.o. male, DOB - [birth year], ZHY:[id number]  Admit date - 03/29/2018   Admitting Physician Dustin Flock, MD  Outpatient Primary MD for the patient is Patient, No Pcp Per   LOS - 5  Subjective: patient is having cough and congestion .   Abdominal pain is better controlled with long-acting oxycodone.  Wanted to try some soft food today.  Review of Systems:   CONSTITUTIONAL: No documented fever. No fatigue, weakness. No weight gain, no weight loss.  EYES: No blurry or double vision.  ENT: No tinnitus. No postnasal drip. No redness of the oropharynx.  RESPIRATORY: Positive cough, no wheeze, no hemoptysis.  Positive dyspnea.  CARDIOVASCULAR: No chest pain. No orthopnea. No palpitations. No syncope.  GASTROINTESTINAL: No nausea, no vomiting or diarrhea.  Positive abdominal pain. No melena or hematochezia.  GENITOURINARY: No dysuria or hematuria.  ENDOCRINE: No polyuria or nocturia. No heat or cold intolerance.  HEMATOLOGY: No anemia. No bruising. No bleeding.  INTEGUMENTARY: No rashes. No lesions.  MUSCULOSKELETAL: No arthritis. No swelling. No gout.  NEUROLOGIC: No numbness, tingling, or ataxia. No seizure-type activity.  PSYCHIATRIC: No anxiety. No insomnia. No ADD.    Vitals:   Vitals:   04/02/18 1741 04/02/18 2011 04/03/18 0455 04/03/18 1500  BP:  114/73 (!) 154/90 139/75  Pulse:  (!) 104 (!) 111 97  Resp:  18 18 20   Temp: 98.6 F (37 C) 98.6 F (37 C) 98.7 F (37.1 C) 98.6 F (37 C)  TempSrc: Oral Oral Oral Oral  SpO2:  92% 92% 95%  Weight:      Height:        Wt Readings from Last 3 Encounters:  03/31/18 82.3 kg  04/03/17 83.5 kg  04/30/16 81.6 kg     Intake/Output Summary (Last 24 hours)  at 04/03/2018 1710 Last data filed at 04/03/2018 1706 Gross per 24 hour  Intake 4839.86 ml  Output 2100 ml  Net 2739.86 ml    Physical Exam:   GENERAL: Pleasant-appearing in no apparent distress.  HEAD, EYES, EARS, NOSE AND THROAT: Atraumatic, normocephalic. Extraocular muscles are intact. Pupils equal and reactive to light. Sclerae anicteric. No conjunctival injection. No oro-pharyngeal erythema.  NECK: Supple. There is no jugular venous distention. No bruits, no lymphadenopathy, no thyromegaly.  HEART: Regular rate and rhythm,. No murmurs, no rubs, no clicks.  LUNGS: Rhonchus breath sounds bilaterally ABDOMEN: Soft,  flat, tender, nondistended. Has good bowel sounds. No hepatosplenomegaly appreciated.  EXTREMITIES: No evidence of any cyanosis, clubbing, or peripheral edema.  +2 pedal and radial pulses bilaterally.  NEUROLOGIC: The patient is alert, awake, and oriented x3 with no focal motor or sensory deficits appreciated bilaterally.  SKIN: Moist and warm with no rashes appreciated.  Psych: Not anxious, depressed LN: No inguinal LN enlargement    Antibiotics   Anti-infectives (From admission, onward)   Start     Dose/Rate Route Frequency Ordered Stop   04/01/18 1830  Ampicillin-Sulbactam (UNASYN) 3 g in sodium chloride 0.9 % 100 mL IVPB     3 g 200 mL/hr over 30 Minutes Intravenous Every 6 hours 04/01/18 1718     04/01/18 1200  levofloxacin (LEVAQUIN) IVPB 500 mg  Status:  Discontinued     500 mg 100 mL/hr over 60 Minutes Intravenous Every 24 hours 04/01/18 1153 04/01/18 1718   03/31/18 0645  cefTRIAXone (ROCEPHIN) 1 g in sodium chloride 0.9 % 100 mL IVPB  Status:  Discontinued     1 g 200 mL/hr over 30 Minutes Intravenous Every 24 hours 03/31/18 0640 04/01/18 1153   03/31/18 0645  azithromycin (ZITHROMAX) 500 mg in sodium chloride 0.9 % 250 mL IVPB  Status:  Discontinued     500 mg 250 mL/hr over 60 Minutes Intravenous Every 24 hours 03/31/18 0640 04/01/18 1153       Medications   Scheduled Meds: . amLODipine  10 mg Oral Daily  . atorvastatin  40 mg Oral Daily  . buPROPion  150 mg Oral Daily  . enoxaparin (LOVENOX) injection  40 mg Subcutaneous Q24H  . guaiFENesin  600 mg Oral BID  . hydrALAZINE  100 mg Oral Q8H  . lipase/protease/amylase  12,000 Units Oral TID WC  . loratadine  10 mg Oral Daily  . nicotine  21 mg Transdermal Daily  . oxyCODONE  10 mg Oral Q12H  . potassium chloride  20 mEq Oral BID  . sertraline  100 mg Oral Daily  . sodium bicarbonate  650 mg Oral BID   Continuous Infusions: . sodium chloride 50 mL/hr at 04/03/18 1701  . ampicillin-sulbactam (UNASYN) IV 3 g (04/03/18 1702)   PRN Meds:.acetaminophen **OR** acetaminophen, chlorproMAZINE, famotidine, fluticasone, gabapentin, guaiFENesin-dextromethorphan, hydrALAZINE, HYDROcodone-acetaminophen, labetalol, morphine injection, ondansetron **OR** ondansetron (ZOFRAN) IV, traZODone   Data Review:   Micro Results Recent Results (from the past 240 hour(s))  CULTURE, BLOOD (ROUTINE X 2) w Reflex to ID Panel     Status: None (Preliminary result)   Collection Time: 03/31/18  4:51 AM  Result Value Ref Range Status   Specimen Description BLOOD RIGHT ASSIST CONTROL  Final   Special Requests   Final    BOTTLES DRAWN AEROBIC AND ANAEROBIC Blood Culture adequate volume   Culture   Final    NO GROWTH 3 DAYS Performed at [facility], [address] [place], [place] [address]    Report Status PENDING  Incomplete  CULTURE, BLOOD (ROUTINE X 2) w Reflex to ID Panel     Status: None (Preliminary result)   Collection Time: 03/31/18  5:02 AM  Result Value Ref Range Status   Specimen Description BLOOD RIGHT HAND  Final   Special Requests   Final    BOTTLES DRAWN AEROBIC AND ANAEROBIC Blood Culture adequate volume   Culture   Final    NO GROWTH 3 DAYS Performed at [facility], [address] [place], [place] [address]    Report Status  PENDING  Incomplete   Expectorated sputum assessment w rflx to resp cult     Status: None   Collection Time: 03/31/18 12:07 PM  Result Value Ref Range Status   Specimen Description SPUTUM  Final   Special Requests NONE  Final   Sputum evaluation   Final    Sputum specimen not acceptable for testing.  Please recollect.   Performed at [facility], [address] [place], [place] [address]    Report Status 03/31/2018 FINAL  Final    Radiology Reports Ct Abdomen Pelvis W Contrast  Result Date: 03/29/2018 CLINICAL DATA:  Left lower abdominal pain, nausea, vomiting EXAM: CT ABDOMEN AND PELVIS WITH CONTRAST TECHNIQUE: Multidetector CT imaging of the abdomen and pelvis was performed using the standard protocol following bolus administration of intravenous contrast. CONTRAST:  184mL OMNIPAQUE IOHEXOL 300 MG/ML  SOLN COMPARISON:  04/02/2017 FINDINGS: Lower chest: Subpleural nodule in the left lower lobe measures 6 mm, stable. Increasing opacity in the right lower lobe at the right base could reflect atelectasis or developing pneumonia. No effusions. Heart is borderline in size. Hepatobiliary: Mild diffuse low-density compatible with fatty infiltration. No focal hepatic abnormality. Gallbladder unremarkable. Pancreas: Extensive pancreatic calcifications compatible with chronic pancreatitis. Ductal dilatation in the pancreatic tail. Questionable slight/early stranding around the pancreatic body and tail. Spleen: No focal abnormality.  Normal size. Adrenals/Urinary Tract: Scarring in the mid and lower pole of the right kidney. Small left adrenal nodule measures up to 1.7 cm, stable. No renal mass or hydronephrosis. Urinary bladder unremarkable. Stomach/Bowel: Normal appendix. Stomach, large and small bowel grossly unremarkable. Vascular/Lymphatic: Diffuse aortic and iliac calcifications. No aneurysm or adenopathy. Reproductive: No visible focal abnormality. Other: No free fluid or free air. Musculoskeletal: No acute bony  abnormality. IMPRESSION: Changes of chronic pancreatitis, stable. There may be slight stranding around the pancreatic body and tail suggesting early pancreatitis. Recommend clinical correlation. Hepatic steatosis. Diffuse aortoiliac atherosclerosis. Mild airspace disease in the posterior right lower lobe at the right lung base could reflect atelectasis or early infiltrate/pneumonia. Electronically Signed   By: Rolm Baptise M.D.   On: 03/29/2018 07:08   Dg Chest Port 1 View  Result Date: 03/31/2018 CLINICAL DATA:  Fever. EXAM: PORTABLE CHEST 1 VIEW COMPARISON:  None. FINDINGS: No pneumothorax. The heart size not well evaluated due to portable technique. The heart size appears borderline to mildly enlarged on this study. The hila and mediastinum are normal. No pulmonary nodules or masses. No overt edema. No focal infiltrate. Mild opacity in the right base, likely atelectasis and vascular crowding. IMPRESSION: 1. Mild opacity in the right base is likely a mixture of vascular crowding and atelectasis. No suspicious focal infiltrate. No other acute abnormality noted. Electronically Signed   By: Dorise Bullion III M.D   On: 03/31/2018 07:23     CBC Recent Labs  Lab 03/29/18 0536 03/30/18 0414 04/01/18 0824 04/02/18 0335  WBC 13.6* 13.5* 15.7* 16.0*  HGB 14.9 14.3 11.9* 11.5*  HCT 43.4 41.6 33.9* 32.9*  PLT 251 253 210 214  MCV 91.7 91.7 90.0 90.3  MCH 31.4 31.4 31.6 31.6  MCHC 34.3 34.3 35.2 35.0  RDW 14.0 14.2 14.0 13.6  LYMPHSABS  --   --  1.2 1.3  MONOABS  --   --  0.9 1.2*  EOSABS  --   --  0.0 0.1  BASOSABS  --   --  0.0 0.0    Chemistries  Recent Labs  Lab 03/29/18 0536 03/30/18 0414 04/01/18 0865  04/02/18 0335  NA 135 133* 130* 131*  K 3.6 3.7 3.4* 3.2*  CL 104 100 101 101  CO2 22 24 20* 22  GLUCOSE 137* 137* 98 99  BUN 15 10 11 9   CREATININE 1.25* 0.94 1.02 1.01  CALCIUM 8.8* 8.8* 8.0* 8.0*  AST 23  --  14*  --   ALT 11  --  9  --   ALKPHOS 77  --  52  --   BILITOT  1.1  --  1.0  --    ------------------------------------------------------------------------------------------------------------------ estimated creatinine clearance is 89.5 mL/min (by C-G formula based on SCr of 1.01 mg/dL). ------------------------------------------------------------------------------------------------------------------ No results for input(s): HGBA1C in the last 72 hours. ------------------------------------------------------------------------------------------------------------------ No results for input(s): CHOL, HDL, LDLCALC, TRIG, CHOLHDL, LDLDIRECT in the last 72 hours. ------------------------------------------------------------------------------------------------------------------ No results for input(s): TSH, T4TOTAL, T3FREE, THYROIDAB in the last 72 hours.  Invalid input(s): FREET3 ------------------------------------------------------------------------------------------------------------------ No results for input(s): VITAMINB12, FOLATE, FERRITIN, TIBC, IRON, RETICCTPCT in the last 72 hours.  Coagulation profile No results for input(s): INR, PROTIME in the last 168 hours.  No results for input(s): DDIMER in the last 72 hours.  Cardiac Enzymes No results for input(s): CKMB, TROPONINI, MYOGLOBIN in the last 168 hours.  Invalid input(s): CK ------------------------------------------------------------------------------------------------------------------ Invalid input(s): [place]  Patient is 52 year old with history of chronic pancreatitis presenting with abdominal pain  1.  Acute on chronic pancreatitis Tolerating clear liquids-upgraded to full liquid. Continue pain control. With addition of long-acting oxycodone, pain is little better controlled and he want to try soft food now.  2.  Fever due to pneumonia antibiotics to cover possible aspiration  procalcitonin is high we will follow procalcitonin levels  3.  Hyponatremia  patient currently on normal saline.  Check serum and urine osmolality  4. Essential hypertension blood pressure improved  5..  Hyperlipidemia continue Lipitor  6  Depression continue Wellbutrin  7..  GERD continue Zantac  8  Nicotine abuse smoking cessation provided for 4 minutes.      Code Status Orders  (From admission, onward)         Start     Ordered   03/29/18 1110  Full code  Continuous     03/29/18 1109        Code Status History    Date Active Date Inactive Code Status Order ID Comments User Context   04/03/2017 0314 04/04/2017 1741 Full Code [id number]  Saundra Shelling, MD Inpatient   04/29/2016 1122 04/30/2016 1847 Full Code [id number]  Hillary Bow, MD ED         Consultsnone  DVT Prophylaxis  Lovenox   Lab Results  Component Value Date   PLT 214 04/02/2018     Time Spent in minutes   68min Greater than 50% of time spent in care coordination and counseling patient regarding the condition and plan of care.   Vaughan Basta M.D on 04/03/2018 at 5:10 PM  Between 7am to 6pm - Pager - [phone]  After 6pm go to [email or web address] - Proofreader  [employer]   Office  [phone]

## 2018-04-04 ENCOUNTER — Inpatient Hospital Stay: Payer: Medicaid Other

## 2018-04-04 LAB — CBC
HEMATOCRIT: 33.2 % — AB (ref 40.0–52.0)
HEMOGLOBIN: 11.4 g/dL — AB (ref 13.0–18.0)
MCH: 31 pg (ref 26.0–34.0)
MCHC: 34.3 g/dL (ref 32.0–36.0)
MCV: 90.6 fL (ref 80.0–100.0)
Platelets: 290 10*3/uL (ref 150–440)
RBC: 3.66 MIL/uL — ABNORMAL LOW (ref 4.40–5.90)
RDW: 13.9 % (ref 11.5–14.5)
WBC: 13.2 10*3/uL — AB (ref 3.8–10.6)

## 2018-04-04 LAB — BASIC METABOLIC PANEL
ANION GAP: 7 (ref 5–15)
BUN: 6 mg/dL (ref 6–20)
CALCIUM: 8.2 mg/dL — AB (ref 8.9–10.3)
CHLORIDE: 102 mmol/L (ref 98–111)
CO2: 26 mmol/L (ref 22–32)
CREATININE: 1.12 mg/dL (ref 0.61–1.24)
GFR calc non Af Amer: >60 mL/min (ref >60)
Glucose, Bld: 126 mg/dL — ABNORMAL HIGH (ref 70–99)
Potassium: 3.2 mmol/L — ABNORMAL LOW (ref 3.5–5.1)
Sodium: 135 mmol/L (ref 135–145)

## 2018-04-04 MED ORDER — AMOXICILLIN-POT CLAVULANATE 875-125 MG PO TABS: 1.0000 | ORAL_TABLET | Freq: Two times a day (BID) | ORAL | 0 refills | Status: AC

## 2018-04-04 MED ORDER — OXYCODONE HCL ER 10 MG PO T12A: 10.0000 mg | EXTENDED_RELEASE_TABLET | Freq: Two times a day (BID) | ORAL | 0 refills | Status: AC

## 2018-04-04 MED ORDER — POTASSIUM CHLORIDE CRYS ER 20 MEQ PO TBCR
40.0000 meq | EXTENDED_RELEASE_TABLET | Freq: Two times a day (BID) | ORAL | Status: DC
  Administered 2018-04-04: 40 meq via ORAL
  Filled 2018-04-04: qty 2

## 2018-04-04 MED ORDER — GUAIFENESIN ER 600 MG PO TB12: 600.0000 mg | ORAL_TABLET | Freq: Two times a day (BID) | ORAL | 0 refills | Status: AC

## 2018-04-04 MED ORDER — AZITHROMYCIN 250 MG PO TABS: 250.0000 mg | ORAL_TABLET | Freq: Every day | ORAL | 0 refills | Status: AC

## 2018-04-04 NOTE — Progress Notes (Signed)
Discharge instructions given and went over with patient at bedside. Prescriptions given and reviewed. All questions answered. Patient discharged home with friend via wheelchair by volunteer services. Madlyn Frankel, RN

## 2018-04-05 LAB — CULTURE, BLOOD (ROUTINE X 2)
CULTURE: NO GROWTH
Culture: NO GROWTH
Special Requests: ADEQUATE
Special Requests: ADEQUATE

## 2018-04-09 NOTE — Discharge Summary (Signed)
[place] at [place] NAME: Noah Velasquez    MR#:  [id number]  DATE OF BIRTH:  27-May-1966  DATE OF ADMISSION:  03/29/2018 ADMITTING PHYSICIAN: Dustin Flock, MD  DATE OF DISCHARGE: 04/04/2018  1:00 PM  PRIMARY CARE PHYSICIAN: Patient, No Pcp Per    ADMISSION DIAGNOSIS:  Chronic pancreatitis, unspecified pancreatitis type ([place]) [K86.1] Nausea and vomiting, intractability of vomiting not specified, unspecified vomiting type [R11.2]  DISCHARGE DIAGNOSIS:  Active Problems:   Pancreatitis   Pneumonia   SECONDARY DIAGNOSIS:   Past Medical History:  Diagnosis Date  . Chronic hip pain   . Chronic pancreatitis ([place])   . Hypertension   . Pancreatitis     HOSPITAL COURSE:   Patient is 52 year old with history of chronic pancreatitis presenting with abdominal pain  1.Acute on chronic pancreatitis Tolerating clear liquids-upgraded to full liquid. Continue pain control. With addition of long-acting oxycodone, pain is little better controlled and he want to try soft food now. Tolerated well.  2.  Fever due to pneumonia antibiotics to cover possible aspiration  procalcitonin is high we will follow procalcitonin levels  3.  Hyponatremia patient currently on normal saline.  Check serum and urine osmolality  4.Essential hypertension blood pressure improved  5.. Hyperlipidemia continue Lipitor  6Depression continue Wellbutrin  7.Marland KitchenGERD continue Zantac  8Nicotine abuse smoking cessation provided for 4 minutes.   DISCHARGE CONDITIONS:   Stable.  CONSULTS OBTAINED:    DRUG ALLERGIES:   Allergies  Allergen Reactions  . Hctz [Hydrochlorothiazide] Swelling    DISCHARGE MEDICATIONS:   Allergies as of 04/04/2018      Reactions   Hctz [hydrochlorothiazide] Swelling      Medication List    TAKE these medications   amLODipine 10 MG tablet Commonly known as:  NORVASC Take 10 mg by mouth daily.    atorvastatin 40 MG tablet Commonly known as:  LIPITOR Take 40 mg by mouth daily.   buPROPion 150 MG 12 hr tablet Commonly known as:  WELLBUTRIN SR Take 150 mg by mouth daily.   fluticasone 50 MCG/ACT nasal spray Commonly known as:  FLONASE Place 1 spray into both nostrils daily as needed for allergies or rhinitis.   gabapentin 100 MG capsule Commonly known as:  NEURONTIN Take 100 mg by mouth at bedtime as needed (sleep).   loratadine 10 MG tablet Commonly known as:  CLARITIN Take 10 mg by mouth daily.   naproxen 500 MG tablet Commonly known as:  NAPROSYN Take 500 mg by mouth 2 (two) times daily with a meal.   ranitidine 150 MG tablet Commonly known as:  ZANTAC Take 150 mg by mouth 2 (two) times daily as needed for heartburn.   sertraline 100 MG tablet Commonly known as:  ZOLOFT Take 100 mg by mouth.   traZODone 150 MG tablet Commonly known as:  DESYREL Take 1 tablet by mouth at bedtime as needed for sleep.   ZENPEP 15000-51000 units Cpep Generic drug:  Pancrelipase (Lip-Prot-Amyl) Take 1 capsule by mouth 3 (three) times daily. With meals     ASK your doctor about these medications   amoxicillin-clavulanate 875-125 MG tablet Commonly known as:  AUGMENTIN Take 1 tablet by mouth 2 (two) times daily for 3 days. Ask about: Should I take this medication?   azithromycin 250 MG tablet Commonly known as:  ZITHROMAX Take 1 tablet (250 mg total) by mouth daily for 3 days. Take 2 tablets (500 mg) on  Day 1,  followed  by 1 tablet (250 mg) once daily on Days 2 through 5. Ask about: Should I take this medication?   guaiFENesin 600 MG 12 hr tablet Commonly known as:  MUCINEX Take 1 tablet (600 mg total) by mouth 2 (two) times daily for 4 days. Ask about: Should I take this medication?   oxyCODONE 10 mg 12 hr tablet Commonly known as:  OXYCONTIN Take 1 tablet (10 mg total) by mouth every 12 (twelve) hours for 2 days. Ask about: Should I take this medication?         DISCHARGE INSTRUCTIONS:    Follow with PMD in 1-2 weeks.  If you experience worsening of your admission symptoms, develop shortness of breath, life threatening emergency, suicidal or homicidal thoughts you must seek medical attention immediately by calling 911 or calling your MD immediately  if symptoms less severe.  You Must read complete instructions/literature along with all the possible adverse reactions/side effects for all the Medicines you take and that have been prescribed to you. Take any new Medicines after you have completely understood and accept all the possible adverse reactions/side effects.   Please note  You were cared for by a hospitalist during your hospital stay. If you have any questions about your discharge medications or the care you received while you were in the hospital after you are discharged, you can call the unit and asked to speak with the hospitalist on call if the hospitalist that took care of you is not available. Once you are discharged, your primary care physician will handle any further medical issues. Please note that NO REFILLS for any discharge medications will be authorized once you are discharged, as it is imperative that you return to your primary care physician (or establish a relationship with a primary care physician if you do not have one) for your aftercare needs so that they can reassess your need for medications and monitor your lab values.    Today   CHIEF COMPLAINT:   Chief Complaint  Patient presents with  . Abdominal Pain    HISTORY OF PRESENT ILLNESS:  Noah Velasquez  is a 52 y.o. male with a known history of pancreatitis, hypertension, hyperlipidemia who is presenting to the hospital with abdominal pain.  Patient states that he used to drink heavy but only drinks occasionally now.  About a week ago he had 6 beers.  Since then he has not had anything to drink.  He comes to the ER with sharp epigastric pain radiating to his back.   Noted to have elevated lipase as well as elevated WBC count as well as CT suggestive of early pancreatitis.  Patient also complains of nausea vomiting.   VITAL SIGNS:  Blood pressure (!) 125/54, pulse 100, temperature 97.8 F (36.6 C), temperature source Oral, resp. rate 14, height 5\' 8"  (1.727 m), weight 82.3 kg, SpO2 97 %.  I/O:  No intake or output data in the 24 hours ending 04/09/18 2127  PHYSICAL EXAMINATION:  GENERAL: Pleasant-appearing in no apparent distress.  HEAD, EYES, EARS, NOSE AND THROAT: Atraumatic, normocephalic. Extraocular muscles are intact. Pupils equal and reactive to light. Sclerae anicteric. No conjunctival injection. No oro-pharyngeal erythema.  NECK: Supple. There is no jugular venous distention. No bruits, no lymphadenopathy, no thyromegaly.  HEART: Regular rate and rhythm,. No murmurs, no rubs, no clicks.  LUNGS: Rhonchus breath sounds bilaterally ABDOMEN: Soft, flat, tender, nondistended. Has good bowel sounds. No hepatosplenomegaly appreciated.  EXTREMITIES: No evidence of any cyanosis, clubbing, or peripheral edema.  +  2 pedal and radial pulses bilaterally.  NEUROLOGIC: The patient is alert, awake, and oriented x3 with no focal motor or sensory deficits appreciated bilaterally.  SKIN: Moist and warm with no rashes appreciated.  Psych: Not anxious, depressed LN: No inguinal LN enlargement  DATA REVIEW:   CBC Recent Labs  Lab 04/04/18 0453  WBC 13.2*  HGB 11.4*  HCT 33.2*  PLT 290    Chemistries  Recent Labs  Lab 04/04/18 0453  NA 135  K 3.2*  CL 102  CO2 26  GLUCOSE 126*  BUN 6  CREATININE 1.12  CALCIUM 8.2*    Cardiac Enzymes No results for input(s): TROPONINI in the last 168 hours.  Microbiology Results  Results for orders placed or performed during the hospital encounter of 03/29/18  CULTURE, BLOOD (ROUTINE X 2) w Reflex to ID Panel     Status: None   Collection Time: 03/31/18  4:51 AM  Result Value Ref Range Status   Specimen  Description BLOOD RIGHT ASSIST CONTROL  Final   Special Requests   Final    BOTTLES DRAWN AEROBIC AND ANAEROBIC Blood Culture adequate volume   Culture   Final    NO GROWTH 5 DAYS Performed at [facility], [address] [place], [place] [address]    Report Status 04/05/2018 FINAL  Final  CULTURE, BLOOD (ROUTINE X 2) w Reflex to ID Panel     Status: None   Collection Time: 03/31/18  5:02 AM  Result Value Ref Range Status   Specimen Description BLOOD RIGHT HAND  Final   Special Requests   Final    BOTTLES DRAWN AEROBIC AND ANAEROBIC Blood Culture adequate volume   Culture   Final    NO GROWTH 5 DAYS Performed at [facility], [address] [place], [place] [address]    Report Status 04/05/2018 FINAL  Final  Expectorated sputum assessment w rflx to resp cult     Status: None   Collection Time: 03/31/18 12:07 PM  Result Value Ref Range Status   Specimen Description SPUTUM  Final   Special Requests NONE  Final   Sputum evaluation   Final    Sputum specimen not acceptable for testing.  Please recollect.   Performed at [facility], [address] [place], [place] [address]    Report Status 03/31/2018 FINAL  Final    RADIOLOGY:  No results found.  EKG:   Orders placed or performed during the hospital encounter of 04/29/16  . EKG 12-Lead  . EKG 12-Lead      Management plans discussed with the patient, family and they are in agreement.  CODE STATUS:  Code Status History    Date Active Date Inactive Code Status Order ID Comments User Context   03/29/2018 1109 04/04/2018 1616 Full Code [id number]  Dustin Flock, MD Inpatient   04/03/2017 0314 04/04/2017 1741 Full Code [id number]  Saundra Shelling, MD Inpatient   04/29/2016 1122 04/30/2016 1847 Full Code [id number]  Hillary Bow, MD ED      TOTAL TIME TAKING CARE OF THIS PATIENT: 35 minutes.    Vaughan Basta M.D on 04/09/2018 at 9:27 PM  Between 7am to 6pm - Pager -  [phone]  After 6pm go to [email or web address] - password [email or web address] [facility]  Office  [phone]  CC: Primary care physician; Patient, No Pcp Per   Note: This dictation was prepared with Dragon dictation along with smaller phrase technology. Any transcriptional errors that result from this  process are unintentional.

## 2018-05-06 ENCOUNTER — Emergency Department: Payer: Medicaid Other

## 2018-05-06 ENCOUNTER — Inpatient Hospital Stay
Admission: EM | Admit: 2018-05-06 | Discharge: 2018-05-08 | DRG: 440 | Disposition: A | Discharge status: Home / Self Care | Payer: Medicaid Other | Attending: Internal Medicine | Admitting: Internal Medicine

## 2018-05-06 ENCOUNTER — Other Ambulatory Visit: Payer: Self-pay

## 2018-05-06 ENCOUNTER — Inpatient Hospital Stay: Payer: Medicaid Other

## 2018-05-06 DIAGNOSIS — K861 Other chronic pancreatitis: Secondary | ICD-10-CM | POA: Diagnosis not present

## 2018-05-06 DIAGNOSIS — J9621 Acute and chronic respiratory failure with hypoxia: Secondary | ICD-10-CM | POA: Diagnosis present

## 2018-05-06 DIAGNOSIS — F1010 Alcohol abuse, uncomplicated: Secondary | ICD-10-CM | POA: Diagnosis present

## 2018-05-06 DIAGNOSIS — F17210 Nicotine dependence, cigarettes, uncomplicated: Secondary | ICD-10-CM | POA: Diagnosis present

## 2018-05-06 DIAGNOSIS — K859 Acute pancreatitis without necrosis or infection, unspecified: Secondary | ICD-10-CM | POA: Diagnosis not present

## 2018-05-06 DIAGNOSIS — I10 Essential (primary) hypertension: Secondary | ICD-10-CM | POA: Diagnosis present

## 2018-05-06 DIAGNOSIS — Z23 Encounter for immunization: Secondary | ICD-10-CM

## 2018-05-06 DIAGNOSIS — K852 Alcohol induced acute pancreatitis without necrosis or infection: Secondary | ICD-10-CM | POA: Diagnosis not present

## 2018-05-06 DIAGNOSIS — R109 Unspecified abdominal pain: Secondary | ICD-10-CM | POA: Diagnosis not present

## 2018-05-06 DIAGNOSIS — Z833 Family history of diabetes mellitus: Secondary | ICD-10-CM | POA: Diagnosis not present

## 2018-05-06 DIAGNOSIS — F329 Major depressive disorder, single episode, unspecified: Secondary | ICD-10-CM | POA: Diagnosis present

## 2018-05-06 DIAGNOSIS — R935 Abnormal findings on diagnostic imaging of other abdominal regions, including retroperitoneum: Secondary | ICD-10-CM | POA: Diagnosis not present

## 2018-05-06 DIAGNOSIS — J9620 Acute and chronic respiratory failure, unspecified whether with hypoxia or hypercapnia: Secondary | ICD-10-CM | POA: Diagnosis present

## 2018-05-06 DIAGNOSIS — R112 Nausea with vomiting, unspecified: Secondary | ICD-10-CM | POA: Diagnosis not present

## 2018-05-06 LAB — COMPREHENSIVE METABOLIC PANEL
ALBUMIN: 3.9 g/dL (ref 3.5–5.0)
ALT: 11 U/L (ref 0–44)
AST: 19 U/L (ref 15–41)
Alkaline Phosphatase: 110 U/L (ref 38–126)
Anion gap: 8 (ref 5–15)
BUN: 11 mg/dL (ref 6–20)
CALCIUM: 9.5 mg/dL (ref 8.9–10.3)
CHLORIDE: 101 mmol/L (ref 98–111)
CO2: 26 mmol/L (ref 22–32)
Creatinine, Ser: 1.2 mg/dL (ref 0.61–1.24)
GFR calc Af Amer: >60 mL/min (ref >60)
GFR calc non Af Amer: >60 mL/min (ref >60)
GLUCOSE: 121 mg/dL — AB (ref 70–99)
Potassium: 3.8 mmol/L (ref 3.5–5.1)
Sodium: 135 mmol/L (ref 135–145)
TOTAL PROTEIN: 8.6 g/dL — AB (ref 6.5–8.1)
Total Bilirubin: 0.8 mg/dL (ref 0.3–1.2)

## 2018-05-06 LAB — URINALYSIS, COMPLETE (UACMP) WITH MICROSCOPIC
BILIRUBIN URINE: NEGATIVE
Bacteria, UA: NONE SEEN
GLUCOSE, UA: NEGATIVE mg/dL
HGB URINE DIPSTICK: NEGATIVE
Ketones, ur: NEGATIVE mg/dL
LEUKOCYTES UA: NEGATIVE
NITRITE: NEGATIVE
PROTEIN: NEGATIVE mg/dL
Specific Gravity, Urine: 1.006 (ref 1.005–1.030)
Squamous Epithelial / LPF: NONE SEEN (ref 0–5)
pH: 7 (ref 5.0–8.0)

## 2018-05-06 LAB — TROPONIN I: Troponin I: <0.03 ng/mL (ref <0.03)

## 2018-05-06 LAB — LIPASE, BLOOD: Lipase: 140 U/L — ABNORMAL HIGH (ref 11–51)

## 2018-05-06 LAB — CBC
HCT: 40.2 % (ref 40.0–52.0)
Hemoglobin: 13.9 g/dL (ref 13.0–18.0)
MCH: 31 pg (ref 26.0–34.0)
MCHC: 34.6 g/dL (ref 32.0–36.0)
MCV: 89.5 fL (ref 80.0–100.0)
Platelets: 263 10*3/uL (ref 150–440)
RBC: 4.5 MIL/uL (ref 4.40–5.90)
RDW: 15 % — ABNORMAL HIGH (ref 11.5–14.5)
WBC: 7.6 10*3/uL (ref 3.8–10.6)

## 2018-05-06 MED ORDER — AMLODIPINE BESYLATE 5 MG PO TABS
10.0000 mg | ORAL_TABLET | Freq: Every day | ORAL | Status: DC
  Administered 2018-05-06 – 2018-05-08 (×3): 10 mg via ORAL
  Filled 2018-05-06 (×3): qty 2

## 2018-05-06 MED ORDER — GADOBENATE DIMEGLUMINE 529 MG/ML IV SOLN
15.0000 mL | Freq: Once | INTRAVENOUS | Status: AC | PRN
  Administered 2018-05-06: 17:00:00 15 mL via INTRAVENOUS

## 2018-05-06 MED ORDER — FLUTICASONE PROPIONATE 50 MCG/ACT NA SUSP
1.0000 | Freq: Every day | NASAL | Status: DC | PRN
  Filled 2018-05-06: qty 16

## 2018-05-06 MED ORDER — ACETAMINOPHEN 650 MG RE SUPP: 650.0000 mg | Freq: Four times a day (QID) | RECTAL | Status: DC | PRN

## 2018-05-06 MED ORDER — ENOXAPARIN SODIUM 40 MG/0.4ML ~~LOC~~ SOLN
40.0000 mg | SUBCUTANEOUS | Status: DC
  Administered 2018-05-06 – 2018-05-07 (×2): 40 mg via SUBCUTANEOUS
  Filled 2018-05-06 (×2): qty 0.4

## 2018-05-06 MED ORDER — LOSARTAN POTASSIUM 50 MG PO TABS
100.0000 mg | ORAL_TABLET | Freq: Every day | ORAL | Status: DC
  Administered 2018-05-06 – 2018-05-08 (×3): 100 mg via ORAL
  Filled 2018-05-06 (×3): qty 2

## 2018-05-06 MED ORDER — ACETAMINOPHEN 325 MG PO TABS: 650.0000 mg | ORAL_TABLET | Freq: Four times a day (QID) | ORAL | Status: DC | PRN

## 2018-05-06 MED ORDER — ONDANSETRON HCL 4 MG PO TABS: 4.0000 mg | ORAL_TABLET | Freq: Four times a day (QID) | ORAL | Status: DC | PRN

## 2018-05-06 MED ORDER — TRAZODONE HCL 50 MG PO TABS: 150.0000 mg | ORAL_TABLET | Freq: Every evening | ORAL | Status: DC | PRN

## 2018-05-06 MED ORDER — MORPHINE SULFATE (PF) 4 MG/ML IV SOLN
4.0000 mg | INTRAVENOUS | Status: DC | PRN
  Administered 2018-05-06 – 2018-05-08 (×5): 4 mg via INTRAVENOUS
  Filled 2018-05-06 (×5): qty 1

## 2018-05-06 MED ORDER — PANCRELIPASE (LIP-PROT-AMYL) 12000-38000 UNITS PO CPEP
12000.0000 [IU] | ORAL_CAPSULE | Freq: Three times a day (TID) | ORAL | Status: DC
  Administered 2018-05-06 – 2018-05-08 (×5): 12000 [IU] via ORAL
  Filled 2018-05-06 (×5): qty 1

## 2018-05-06 MED ORDER — ATORVASTATIN CALCIUM 20 MG PO TABS
40.0000 mg | ORAL_TABLET | Freq: Every day | ORAL | Status: DC
  Administered 2018-05-06 – 2018-05-08 (×3): 40 mg via ORAL
  Filled 2018-05-06 (×3): qty 2

## 2018-05-06 MED ORDER — HYDROCODONE-ACETAMINOPHEN 5-325 MG PO TABS
1.0000 | ORAL_TABLET | ORAL | Status: DC | PRN
  Administered 2018-05-06 – 2018-05-08 (×7): 2 via ORAL
  Filled 2018-05-06 (×7): qty 2

## 2018-05-06 MED ORDER — BUPROPION HCL ER (SR) 150 MG PO TB12
150.0000 mg | ORAL_TABLET | Freq: Every day | ORAL | Status: DC
  Administered 2018-05-06 – 2018-05-08 (×3): 150 mg via ORAL
  Filled 2018-05-06 (×3): qty 1

## 2018-05-06 MED ORDER — KETOROLAC TROMETHAMINE 30 MG/ML IJ SOLN
15.0000 mg | Freq: Once | INTRAMUSCULAR | Status: AC
  Administered 2018-05-06: 15 mg via INTRAVENOUS
  Filled 2018-05-06: qty 1

## 2018-05-06 MED ORDER — SODIUM CHLORIDE 0.9 % IV BOLUS
1000.0000 mL | Freq: Once | INTRAVENOUS | Status: AC
  Administered 2018-05-06: 1000 mL via INTRAVENOUS

## 2018-05-06 MED ORDER — PROMETHAZINE HCL 25 MG/ML IJ SOLN
12.5000 mg | Freq: Four times a day (QID) | INTRAMUSCULAR | Status: DC | PRN
  Administered 2018-05-06 (×2): 12.5 mg via INTRAVENOUS
  Filled 2018-05-06 (×2): qty 1

## 2018-05-06 MED ORDER — SERTRALINE HCL 50 MG PO TABS
100.0000 mg | ORAL_TABLET | Freq: Every day | ORAL | Status: DC
  Administered 2018-05-06 – 2018-05-08 (×3): 100 mg via ORAL
  Filled 2018-05-06 (×3): qty 2

## 2018-05-06 MED ORDER — FAMOTIDINE 20 MG PO TABS
10.0000 mg | ORAL_TABLET | Freq: Every day | ORAL | Status: DC | PRN
  Administered 2018-05-06 – 2018-05-07 (×2): 10 mg via ORAL
  Filled 2018-05-06 (×2): qty 1

## 2018-05-06 MED ORDER — ONDANSETRON HCL 4 MG/2ML IJ SOLN: 4.0000 mg | Freq: Four times a day (QID) | INTRAMUSCULAR | Status: DC | PRN

## 2018-05-06 MED ORDER — GABAPENTIN 100 MG PO CAPS
100.0000 mg | ORAL_CAPSULE | Freq: Every evening | ORAL | Status: DC | PRN
  Filled 2018-05-06: qty 1

## 2018-05-06 MED ORDER — ADULT MULTIVITAMIN W/MINERALS CH
1.0000 | ORAL_TABLET | Freq: Every day | ORAL | Status: DC
  Administered 2018-05-06 – 2018-05-08 (×3): 1 via ORAL
  Filled 2018-05-06 (×3): qty 1

## 2018-05-06 MED ORDER — ENSURE ENLIVE PO LIQD
237.0000 mL | Freq: Two times a day (BID) | ORAL | Status: DC
  Administered 2018-05-07 (×2): 237 mL via ORAL

## 2018-05-06 MED ORDER — LORATADINE 10 MG PO TABS
10.0000 mg | ORAL_TABLET | Freq: Every day | ORAL | Status: DC
  Administered 2018-05-06 – 2018-05-08 (×3): 10 mg via ORAL
  Filled 2018-05-06 (×3): qty 1

## 2018-05-06 MED ORDER — INFLUENZA VAC SPLIT QUAD 0.5 ML IM SUSY
0.5000 mL | PREFILLED_SYRINGE | INTRAMUSCULAR | Status: AC
  Administered 2018-05-07: 16:00:00 0.5 mL via INTRAMUSCULAR
  Filled 2018-05-06: qty 0.5

## 2018-05-06 MED ORDER — SODIUM CHLORIDE 0.9 % IV SOLN
INTRAVENOUS | Status: DC
  Administered 2018-05-06 – 2018-05-07 (×3): via INTRAVENOUS

## 2018-05-06 MED ORDER — SODIUM CHLORIDE 0.9 % IV SOLN
Freq: Once | INTRAVENOUS | Status: AC
  Administered 2018-05-06: 13:00:00 via INTRAVENOUS

## 2018-05-06 NOTE — Plan of Care (Signed)
  Problem: Education: Goal: Knowledge of General Education information will improve Description Including pain rating scale, medication(s)/side effects and non-pharmacologic comfort measures Outcome: Progressing   Problem: Education: Goal: Knowledge of Pancreatitis treatment and prevention will improve Outcome: Progressing   Problem: Health Behavior/Discharge Planning: Goal: Ability to formulate a plan to maintain an alcohol-free life will improve Outcome: Progressing   Problem: Nutritional: Goal: Ability to achieve adequate nutritional intake will improve Outcome: Progressing   Problem: Clinical Measurements: Goal: Complications related to the disease process, condition or treatment will be avoided or minimized Outcome: Progressing   

## 2018-05-06 NOTE — ED Triage Notes (Signed)
PT came to ED via EMS from home. Reports left sided abdominal pain w/ n/v starting yesterday. History of pancreatitis and htn.

## 2018-05-06 NOTE — H&P (Signed)
[facility] NAME: Noah Velasquez    MR#:  [id number]  DATE OF BIRTH:  09/06/1965  DATE OF ADMISSION:  05/06/2018  PRIMARY CARE PHYSICIAN: Patient, No Pcp Per   REQUESTING/REFERRING PHYSICIAN: Dr. Saralyn Pilar Robinson's  CHIEF COMPLAINT: Abdominal pain   Chief Complaint  Patient presents with  . Abdominal Pain    HISTORY OF PRESENT ILLNESS:  Noah Velasquez  is a 52 y.o. male with a known history of pancreatitis, essential hypertension, who was recently discharged in August 19 after admission for acute on chronic pancreatitis patient was in the hospital for 5 days, that time patient lipase was high as 140.  This time patient had abdominal pain in the midepigastric area radiating to the back associated nausea started last night.  Patient has history of heavy drinking but he is slow down but he is still drinking had 4 beers a day before and one beer yesterday. PAST MEDICAL HISTORY:   Past Medical History:  Diagnosis Date  . Chronic hip pain   . Chronic pancreatitis ([place])   . Hypertension   . Pancreatitis     PAST SURGICAL HISTOIRY:   Past Surgical History:  Procedure Laterality Date  . pancreatic stent placement      SOCIAL HISTORY:   Social History   Tobacco Use  . Smoking status: Current Every Day Smoker    Packs/day: 0.50    Types: Cigarettes  . Smokeless tobacco: Never Used  Substance Use Topics  . Alcohol use: Yes    Comment: per pt 6pack a week    FAMILY HISTORY:   Family History  Problem Relation Age of Onset  . Diabetes Mother   . Sleep apnea Mother   . CAD Neg Hx     DRUG ALLERGIES:   Allergies  Allergen Reactions  . Hctz [Hydrochlorothiazide] Swelling    REVIEW OF SYSTEMS:  CONSTITUTIONAL: No fever, fatigue or weakness.  EYES: No blurred or double vision.  EARS, NOSE, AND THROAT: No tinnitus or ear pain.  RESPIRATORY: No cough, shortness of breath, wheezing or hemoptysis.   CARDIOVASCULAR: No chest pain, orthopnea, edema.  GASTROINTESTINAL mid gastric abdominal pain, nausea  gENITOURINARY: No dysuria, hematuria.  ENDOCRINE: No polyuria, nocturia,  HEMATOLOGY: No anemia, easy bruising or bleeding SKIN: No rash or lesion. MUSCULOSKELETAL: No joint pain or arthritis.   NEUROLOGIC: No tingling, numbness, weakness.  PSYCHIATRY: No anxiety or depression.   MEDICATIONS AT HOME:   Prior to Admission medications   Medication Sig Start Date End Date Taking? Authorizing Provider  amLODipine (NORVASC) 10 MG tablet Take 10 mg by mouth daily.   Yes [provider]  fluticasone (FLONASE) 50 MCG/ACT nasal spray Place 1 spray into both nostrils daily as needed for allergies or rhinitis.   Yes [provider]  gabapentin (NEURONTIN) 100 MG capsule Take 100 mg by mouth at bedtime as needed (sleep).    Yes [provider]  loratadine (CLARITIN) 10 MG tablet Take 10 mg by mouth daily.   Yes [provider]  losartan (COZAAR) 100 MG tablet Take 100 mg by mouth daily.   Yes [provider]  Pancrelipase, Lip-Prot-Amyl, (ZENPEP) 15000 units CPEP Take 1 capsule by mouth 3 (three) times daily. With meals   Yes [provider]  ranitidine (ZANTAC) 150 MG tablet Take 150 mg by mouth 2 (two) times daily as needed for heartburn.    Yes [provider]  sertraline (ZOLOFT) 100 MG tablet  Take 100 mg by mouth. 03/11/16 03/30/19 Yes [provider]  traZODone (DESYREL) 150 MG tablet Take 1 tablet by mouth at bedtime as needed for sleep.  03/11/16  Yes [provider]  atorvastatin (LIPITOR) 40 MG tablet Take 40 mg by mouth daily.    [provider]  buPROPion (WELLBUTRIN SR) 150 MG 12 hr tablet Take 150 mg by mouth daily.    [provider]  naproxen (NAPROSYN) 500 MG tablet Take 500 mg by mouth 2 (two) times daily with a meal.    [provider]      VITAL SIGNS:  Blood pressure (!)  129/93, pulse 79, temperature 98.6 F (37 C), temperature source Oral, resp. rate 17, height 5\' 7"  (1.702 m), weight 82.1 kg, SpO2 98 %.  PHYSICAL EXAMINATION:  GENERAL:  52 y.o.-year-old patient lying in the bed with no acute distress.  EYES: Pupils equal, round, reactive to light and accommodation. No scleral icterus. Extraocular muscles intact.  HEENT: Head atraumatic, normocephalic. Oropharynx and nasopharynx clear.  NECK:  Supple, no jugular venous distention. No thyroid enlargement, no tenderness.  LUNGS: Normal breath sounds bilaterally, no wheezing, rales,rhonchi or crepitation. No use of accessory muscles of respiration.  CARDIOVASCULAR: S1, S2 normal. No murmurs, rubs, or gallops.  ABDOMEN: Midepigastric tenderness present, no rebound tenderness.  Bowel sounds present. EXTREMITIES: No pedal edema, cyanosis, or clubbing.  NEUROLOGIC: Cranial nerves II through XII are intact. Muscle strength 5/5 in all extremities. Sensation intact. Gait not checked.  PSYCHIATRIC: The patient is alert and oriented x 3.  SKIN: No obvious rash, lesion, or ulcer.   LABORATORY PANEL:   CBC Recent Labs  Lab 05/06/18 1005  WBC 7.6  HGB 13.9  HCT 40.2  PLT 263   ------------------------------------------------------------------------------------------------------------------  Chemistries  Recent Labs  Lab 05/06/18 1005  NA 135  K 3.8  CL 101  CO2 26  GLUCOSE 121*  BUN 11  CREATININE 1.20  CALCIUM 9.5  AST 19  ALT 11  ALKPHOS 110  BILITOT 0.8   ------------------------------------------------------------------------------------------------------------------  Cardiac Enzymes Recent Labs  Lab 05/06/18 1005  TROPONINI <0.03   ------------------------------------------------------------------------------------------------------------------  RADIOLOGY:  US Abdomen Limited Ruq  Result Date: 05/06/2018 CLINICAL DATA:  Abdominal pain.  History of pancreatitis EXAM: ULTRASOUND  ABDOMEN LIMITED RIGHT UPPER QUADRANT COMPARISON:  CT abdomen and pelvis March 29, 2018 FINDINGS: Gallbladder: No gallstones or wall thickening visualized. There is no pericholecystic fluid. No sonographic Murphy sign noted by sonographer. Common bile duct: Diameter: 5 mm. There is no intrahepatic or extrahepatic biliary duct dilatation. There is an echogenic focus in the distal common bile duct measuring 7 mm which is not causing bile duct dilatation. Liver: No focal lesion identified. Within normal limits in parenchymal echogenicity. Portal vein is patent on color Doppler imaging with normal direction of blood flow towards the liver. Pancreas contains multiple calcifications consistent with chronic pancreatitis. IMPRESSION: 1. 7 mm echogenic focus in the distal common bile duct region, not causing common bile duct dilatation. Etiology of this focus is uncertain. A mass in the common bile duct cannot be entirely excluded. This finding warrants MRCP to further evaluate the common bile duct. 2. Evidence of chronic pancreatitis with multiple pancreatic calcifications. 3.  Study otherwise unremarkable. Electronically Signed   By: Lowella Grip III M.D.   On: 05/06/2018 12:23    EKG:   Orders placed or performed during the hospital encounter of 05/06/18  . ED EKG  . ED EKG  . EKG 12-Lead  .  EKG 12-Lead    IMPRESSION AND PLAN:   52 year old male patient with history of heavy alcohol abuse now cut down on drinking but still drinking comes in because of abdominal pain, found to have elevated lipase. 1.  Acute on chronic pancreatitis, recent admission in August for the same problem. Patient lipase is 140 this time.  Last time in August it was 146.  Patient has normal LFTs, rest of the blood work is essentially within normal limits including LFTs.  Continue to pain management, start clear liquids , IV fluids, IV nausea, pain medicines, continue to trend the lipase. 2.  Chronic pancreatitis, 7 mm  echogenic focus in the distal common bile duct mass and CBD not excluded MRCP is recommended for further evaluation of CBD.  So patient is scheduled for MRCP. 3.  Essential hypertension, but there was-he came but now improved slightly.  Resume his home BP medicines including losartan 100 mg daily, amlodipine 10 mg p.o. daily. 4.  History of depression: Patient is on Zoloft 100 mg daily, Wellbutrin 150 mg p.o. daily. 5.  GI, DVT prophylaxis. 6.  EtOH abuse, patient counseled to quit./  his recurrent admissions for pancreatitis due to alcohol,    All the records are reviewed and case discussed with ED provider. Management plans discussed with the patient, family and they are in agreement.  CODE STATUS: Full code  TOTAL TIME TAKING CARE OF THIS PATIENT: 55 minutes.    Epifanio Lesches M.D on 05/06/2018 at 2:10 PM  Between 7am to 6pm - Pager - [phone]  After 6pm go to [email or web address] - password [email or web address]  Office  [phone]  CC: Primary care physician; Patient, No Pcp Per  Note: This dictation was prepared with Dragon dictation along with smaller phrase technology. Any transcriptional errors that result from this process are unintentional.

## 2018-05-06 NOTE — Progress Notes (Signed)
Initial Nutrition Assessment  DOCUMENTATION CODES:   Not applicable  INTERVENTION:   -Ensure Enlive po BID, each supplement provides 350 kcal and 20 grams of protein -MVI with minerals daily  NUTRITION DIAGNOSIS:   Inadequate oral intake related to altered GI function as evidenced by per patient/family report.  GOAL:   Patient will meet greater than or equal to 90% of their needs  MONITOR:   PO intake, Supplement acceptance, Diet advancement, Labs, Weight trends, Skin, I & O's  REASON FOR ASSESSMENT:   Malnutrition Screening Tool    ASSESSMENT:   Noah Velasquez  is a 52 y.o. male with a known history of pancreatitis, essential hypertension, who was recently discharged in August 19 after admission for acute on chronic pancreatitis patient was in the hospital for 5 days, that time patient lipase was high as 140.  This time patient had abdominal pain in the midepigastric area radiating to the back associated nausea started last night.  Patient has history of heavy drinking but he is slow down but he is still drinking had 4 beers a day before and one beer yesterday.  Pt admitted with acute on chronic pancreatitis.   Spoke with pt at bedside, who is in good spirits today. He is eager to start a full liquid diet, however, just arrived to the unit recently. He reports a decreased appetite over the past 1-2 months. He shares at baseline he consumes 3-4 meals per day (meals usual;ly consist of meat, starch, and vegetable- pt really enjoys chicken). However, due to decreased appetite, pt has only been consuming 1-2 meals per day based upon how he feels over the past 3-4 weeks. Pt also reports decreasing his ETOH intake over the past several months.   Pt endorses UBW of around 190#; he estimates a 20# wt loss over the past 4-6 months. However, per wt hx, pt wt has been stable (1.7% wt loss over the past month).   Discussed with pt importance of good meal and supplement intake to promote  healing. Pt amenable to Ensure supplements. Pt occasionally consumes Ensure supplements at home "when I can afford them".   Labs reviewed.   NUTRITION - FOCUSED PHYSICAL EXAM:    Most Recent Value  Orbital Region  No depletion  Upper Arm Region  No depletion  Thoracic and Lumbar Region  No depletion  Buccal Region  No depletion  Temple Region  No depletion  Clavicle Bone Region  No depletion  Clavicle and Acromion Bone Region  No depletion  Scapular Bone Region  No depletion  Patellar Region  Mild depletion  Anterior Thigh Region  Mild depletion  Posterior Calf Region  Mild depletion  Edema (RD Assessment)  None  Hair  Reviewed  Eyes  Reviewed  Mouth  Reviewed  Skin  Reviewed  Nails  Reviewed       Diet Order:   Diet Order            Diet full liquid Room service appropriate? Yes; Fluid consistency: Thin  Diet effective now              EDUCATION NEEDS:   Education needs have been addressed  Skin:  Skin Assessment: Reviewed RN Assessment  Last BM:  05/06/18  Height:   Ht Readings from Last 1 Encounters:  05/06/18 5\' 7"  (1.702 m)    Weight:   Wt Readings from Last 1 Encounters:  05/06/18 82.1 kg    Ideal Body Weight:  67.3 kg  BMI:  Body mass index is 28.35 kg/m.  Estimated Nutritional Needs:   Kcal:  2000-2200  Protein:  100-115 grams  Fluid:  2.0-2.2 L    Quincey Nored A. Jimmye Norman, RD, LDN, CDE Pager: [phone] After hours Pager: [phone]

## 2018-05-06 NOTE — ED Provider Notes (Signed)
[facility] Emergency Department Provider Note    First MD Initiated Contact with Patient 05/06/18 1005     (approximate)  I have reviewed the triage vital signs and the nursing notes.   HISTORY  Chief Complaint Abdominal Pain    HPI Noah Velasquez is a 52 y.o. male   with a history of dependence with history of recurrent chronic pancreatitis resents to the ER with chief complaint of epigastric pain radiating to the left flank and back consistent with previous episodes of pancreatitis.  States he did have several 20 ounce beers over the past few days.  Denies any fevers.  No chest pain or cough.  States pain is moderate to severe.   Past Medical History:  Diagnosis Date  . Chronic hip pain   . Chronic pancreatitis ([place])   . Hypertension   . Pancreatitis    Family History  Problem Relation Age of Onset  . Diabetes Mother   . Sleep apnea Mother   . CAD Neg Hx    Past Surgical History:  Procedure Laterality Date  . pancreatic stent placement     Patient Active Problem List   Diagnosis Date Noted  . Acute renal failure (ARF) ([place]) 04/03/2017  . Pancreatitis 04/29/2016      Prior to Admission medications   Medication Sig Start Date End Date Taking? Authorizing Provider  amLODipine (NORVASC) 10 MG tablet Take 10 mg by mouth daily.    [provider]  atorvastatin (LIPITOR) 40 MG tablet Take 40 mg by mouth daily.    [provider]  buPROPion (WELLBUTRIN SR) 150 MG 12 hr tablet Take 150 mg by mouth daily.    [provider]  fluticasone (FLONASE) 50 MCG/ACT nasal spray Place 1 spray into both nostrils daily as needed for allergies or rhinitis.    [provider]  gabapentin (NEURONTIN) 100 MG capsule Take 100 mg by mouth at bedtime as needed (sleep).     [provider]  loratadine (CLARITIN) 10 MG tablet Take 10 mg by mouth daily.    [provider]  naproxen (NAPROSYN) 500 MG tablet Take 500  mg by mouth 2 (two) times daily with a meal.    [provider]  Pancrelipase, Lip-Prot-Amyl, (ZENPEP) 15000 units CPEP Take 1 capsule by mouth 3 (three) times daily. With meals    [provider]  ranitidine (ZANTAC) 150 MG tablet Take 150 mg by mouth 2 (two) times daily as needed for heartburn.     [provider]  sertraline (ZOLOFT) 100 MG tablet Take 100 mg by mouth. 03/11/16 03/30/19  [provider]  traZODone (DESYREL) 150 MG tablet Take 1 tablet by mouth at bedtime as needed for sleep.  03/11/16   [provider]    Allergies Hctz [hydrochlorothiazide]    Social History Social History   Tobacco Use  . Smoking status: Current Every Day Smoker    Packs/day: 0.50    Types: Cigarettes  . Smokeless tobacco: Never Used  Substance Use Topics  . Alcohol use: Yes    Comment: per pt 6pack a week  . Drug use: Yes    Types: Marijuana    Review of Systems Patient denies headaches, rhinorrhea, blurry vision, numbness, shortness of breath, chest pain, edema, cough, abdominal pain, nausea, vomiting, diarrhea, dysuria, fevers, rashes or hallucinations unless otherwise stated above in HPI. ____________________________________________   PHYSICAL EXAM:  VITAL SIGNS: Vitals:   05/06/18 1049 05/06/18 1219  BP: Marland Kitchen)  157/100 (!) 172/98  Pulse: 93 79  Resp: 18 14  Temp: 98.6 F (37 C)   SpO2: 96% 99%    Constitutional: Alert and oriented.  Eyes: Conjunctivae are normal.  Head: Atraumatic. Nose: No congestion/rhinnorhea. Mouth/Throat: Mucous membranes are moist.   Neck: No stridor. Painless ROM.  Cardiovascular: Normal rate, regular rhythm. Grossly normal heart sounds.  Good peripheral circulation. Respiratory: Normal respiratory effort.  No retractions. Lungs CTAB. Gastrointestinal: Soft and nontender. No distention. No abdominal bruits. No CVA tenderness. Genitourinary:  Musculoskeletal: No lower extremity tenderness nor edema.  No  joint effusions. Neurologic:  Normal speech and language. No gross focal neurologic deficits are appreciated. No facial droop Skin:  Skin is warm, dry and intact. No rash noted. Psychiatric: Mood and affect are normal. Speech and behavior are normal.  ____________________________________________   LABS (all labs ordered are listed, but only abnormal results are displayed)  Results for orders placed or performed during the hospital encounter of 05/06/18 (from the past 24 hour(s))  Lipase, blood     Status: Abnormal   Collection Time: 05/06/18 10:05 AM  Result Value Ref Range   Lipase 140 (H) 11 - 51 U/L  Comprehensive metabolic panel     Status: Abnormal   Collection Time: 05/06/18 10:05 AM  Result Value Ref Range   Sodium 135 135 - 145 mmol/L   Potassium 3.8 3.5 - 5.1 mmol/L   Chloride 101 98 - 111 mmol/L   CO2 26 22 - 32 mmol/L   Glucose, Bld 121 (H) 70 - 99 mg/dL   BUN 11 6 - 20 mg/dL   Creatinine, Ser 1.20 0.61 - 1.24 mg/dL   Calcium 9.5 8.9 - 10.3 mg/dL   Total Protein 8.6 (H) 6.5 - 8.1 g/dL   Albumin 3.9 3.5 - 5.0 g/dL   AST 19 15 - 41 U/L   ALT 11 0 - 44 U/L   Alkaline Phosphatase 110 38 - 126 U/L   Total Bilirubin 0.8 0.3 - 1.2 mg/dL   GFR calc non Af Amer >60 >60 mL/min   GFR calc Af Amer >60 >60 mL/min   Anion gap 8 5 - 15  CBC     Status: Abnormal   Collection Time: 05/06/18 10:05 AM  Result Value Ref Range   WBC 7.6 3.8 - 10.6 K/uL   RBC 4.50 4.40 - 5.90 MIL/uL   Hemoglobin 13.9 13.0 - 18.0 g/dL   HCT 40.2 40.0 - 52.0 %   MCV 89.5 80.0 - 100.0 fL   MCH 31.0 26.0 - 34.0 pg   MCHC 34.6 32.0 - 36.0 g/dL   RDW 15.0 (H) 11.5 - 14.5 %   Platelets 263 150 - 440 K/uL  Troponin I     Status: None   Collection Time: 05/06/18 10:05 AM  Result Value Ref Range   Troponin I <0.03 <0.03 ng/mL  Urinalysis, Complete w Microscopic     Status: Abnormal   Collection Time: 05/06/18 12:09 PM  Result Value Ref Range   Color, Urine STRAW (A) YELLOW   APPearance CLEAR (A)  CLEAR   Specific Gravity, Urine 1.006 1.005 - 1.030   pH 7.0 5.0 - 8.0   Glucose, UA NEGATIVE NEGATIVE mg/dL   Hgb urine dipstick NEGATIVE NEGATIVE   Bilirubin Urine NEGATIVE NEGATIVE   Ketones, ur NEGATIVE NEGATIVE mg/dL   Protein, ur NEGATIVE NEGATIVE mg/dL   Nitrite NEGATIVE NEGATIVE   Leukocytes, UA NEGATIVE NEGATIVE   RBC / HPF 0-5 0 - 5 RBC/hpf  WBC, UA 0-5 0 - 5 WBC/hpf   Bacteria, UA NONE SEEN NONE SEEN   Squamous Epithelial / LPF NONE SEEN 0 - 5   ____________________________________________  EKG My review and personal interpretation at Time: 10:08   Indication: epigastric pain  Rate: 90  Rhythm: sinus Axis: normal Other: normal intervals, no stemi ____________________________________________  RADIOLOGY  I personally reviewed all radiographic images ordered to evaluate for the above acute complaints and reviewed radiology reports and findings.  These findings were personally discussed with the patient.  Please see medical record for radiology report.  ____________________________________________   PROCEDURES  Procedure(s) performed:  Procedures    Critical Care performed: no ____________________________________________   INITIAL IMPRESSION / ASSESSMENT AND PLAN / ED COURSE  Pertinent labs & imaging results that were available during my care of the patient were reviewed by me and considered in my medical decision making (see chart for details).   DDX: pancreatitis, cholecystitis, enteritis, dehydration  Nunzio Banet is a 52 y.o. who presents to the ED with symptoms as described above.  Patient afebrile and hemodynamically stable but very uncomfortable appearing.  Blood will be sent for the above differential.  Blood work does show evidence of mildly elevated lipase consistent with recurrent pancreatitis.  Given his pain and history will order ultrasound to evaluate for pancreatic ab normality.  Will give IV fluids as well as IV pain medication IV  antiemetics.  Clinical Course as of May 06 1312  Fri May 06, 2018  1114 Blood work does show a mildly elevated lipase.  No leukocytosis is afebrile.   [PR]  1312 Ultrasound does show common bile duct abnormality.  Patient still with pain and inability to tolerate p.o.  Will order MRCP as recommended by radiology.  Will discuss case with hospitalist for admission.   [PR]    Clinical Course User Index [PR] Merlyn Lot, MD     As part of my medical decision making, I reviewed the following data within the [place] notes reviewed and incorporated, Labs reviewed, notes from prior ED visits and  Controlled Substance Database   ____________________________________________   FINAL CLINICAL IMPRESSION(S) / ED DIAGNOSES  Final diagnoses:  Pancreatitis  Alcohol-induced acute pancreatitis, unspecified complication status      NEW MEDICATIONS STARTED DURING THIS VISIT:  New Prescriptions   No medications on file     Note:  This document was prepared using Dragon voice recognition software and may include unintentional dictation errors.    Merlyn Lot, MD 05/06/18 1313

## 2018-05-07 LAB — BASIC METABOLIC PANEL
ANION GAP: 7 (ref 5–15)
BUN: 8 mg/dL (ref 6–20)
CALCIUM: 8.4 mg/dL — AB (ref 8.9–10.3)
CO2: 24 mmol/L (ref 22–32)
CREATININE: 1.08 mg/dL (ref 0.61–1.24)
Chloride: 104 mmol/L (ref 98–111)
GFR calc Af Amer: >60 mL/min (ref >60)
GLUCOSE: 121 mg/dL — AB (ref 70–99)
Potassium: 3.7 mmol/L (ref 3.5–5.1)
SODIUM: 135 mmol/L (ref 135–145)

## 2018-05-07 LAB — CBC
HCT: 33.8 % — ABNORMAL LOW (ref 40.0–52.0)
HEMOGLOBIN: 11.8 g/dL — AB (ref 13.0–18.0)
MCH: 31.4 pg (ref 26.0–34.0)
MCHC: 34.8 g/dL (ref 32.0–36.0)
MCV: 90.3 fL (ref 80.0–100.0)
PLATELETS: 209 10*3/uL (ref 150–440)
RBC: 3.74 MIL/uL — ABNORMAL LOW (ref 4.40–5.90)
RDW: 14.9 % — AB (ref 11.5–14.5)
WBC: 9.1 10*3/uL (ref 3.8–10.6)

## 2018-05-07 LAB — LIPASE, BLOOD: LIPASE: 67 U/L — AB (ref 11–51)

## 2018-05-07 LAB — GLUCOSE, CAPILLARY: GLUCOSE-CAPILLARY: 131 mg/dL — AB (ref 70–99)

## 2018-05-07 NOTE — Progress Notes (Addendum)
[facility] [place] NAME: Noah Velasquez    MR#:  [id number]  DATE OF BIRTH:  1966-05-11  SUBJECTIVE:  CHIEF COMPLAINT:   Chief Complaint  Patient presents with  . Abdominal Pain    REVIEW OF SYSTEMS:  Review of Systems  Constitutional: Negative for chills, fever and malaise/fatigue.  HENT: Negative for ear discharge, hearing loss and nosebleeds.   Eyes: Negative for blurred vision and double vision.  Respiratory: Negative for cough, shortness of breath and wheezing.   Cardiovascular: Negative for chest pain and palpitations.  Gastrointestinal: Positive for abdominal pain. Negative for constipation, diarrhea, nausea and vomiting.  Genitourinary: Negative for dysuria.  Musculoskeletal: Negative for myalgias.  Neurological: Negative for dizziness, focal weakness, seizures, weakness and headaches.  Psychiatric/Behavioral: Negative for depression.    DRUG ALLERGIES:   Allergies  Allergen Reactions  . Hctz [Hydrochlorothiazide] Swelling    VITALS:  Blood pressure 131/88, pulse 81, temperature 97.8 F (36.6 C), temperature source Oral, resp. rate 14, height 5\' 7"  (1.702 m), weight 82.6 kg, SpO2 96 %.  PHYSICAL EXAMINATION:  Physical Exam  GENERAL:  52 y.o.-year-old patient lying in the bed with no acute distress.  EYES: Pupils equal, round, reactive to light and accommodation. No scleral icterus. Extraocular muscles intact.  HEENT: Head atraumatic, normocephalic. Oropharynx and nasopharynx clear.  NECK:  Supple, no jugular venous distention. No thyroid enlargement, no tenderness.  LUNGS: Normal breath sounds bilaterally, no wheezing, rales,rhonchi or crepitation. No use of accessory muscles of respiration.  CARDIOVASCULAR: S1, S2 normal. No murmurs, rubs, or gallops.  ABDOMEN: Soft, mild epigastric pain with voluntary guarding, nondistended. Bowel sounds present. No organomegaly or mass.  EXTREMITIES: No pedal edema, cyanosis,  or clubbing.  NEUROLOGIC: Cranial nerves II through XII are intact. Muscle strength 5/5 in all extremities. Sensation intact. Gait not checked.  PSYCHIATRIC: The patient is alert and oriented x 3.  SKIN: No obvious rash, lesion, or ulcer.    LABORATORY PANEL:   CBC Recent Labs  Lab 05/07/18 0353  WBC 9.1  HGB 11.8*  HCT 33.8*  PLT 209   ------------------------------------------------------------------------------------------------------------------  Chemistries  Recent Labs  Lab 05/06/18 1005 05/07/18 0353  NA 135 135  K 3.8 3.7  CL 101 104  CO2 26 24  GLUCOSE 121* 121*  BUN 11 8  CREATININE 1.20 1.08  CALCIUM 9.5 8.4*  AST 19  --   ALT 11  --   ALKPHOS 110  --   BILITOT 0.8  --    ------------------------------------------------------------------------------------------------------------------  Cardiac Enzymes Recent Labs  Lab 05/06/18 1005  TROPONINI <0.03   ------------------------------------------------------------------------------------------------------------------  RADIOLOGY:  Mr 3d Recon At Scanner  Result Date: 05/06/2018 CLINICAL DATA:  Abdominal pain. Suspected atypical pancreatitis. History of acute renal failure and chronic pancreatitis. EXAM: MRI ABDOMEN WITHOUT AND WITH CONTRAST (INCLUDING MRCP) TECHNIQUE: Multiplanar multisequence MR imaging of the abdomen was performed both before and after the administration of intravenous contrast. Heavily T2-weighted images of the biliary and pancreatic ducts were obtained, and three-dimensional MRCP images were rendered by post processing. CONTRAST:  39mL MULTIHANCE GADOBENATE DIMEGLUMINE 529 MG/ML IV SOLN COMPARISON:  Ultrasound 05/06/2018.  CT 03/29/2018 and 04/02/2017. FINDINGS: Lower chest: Mild atelectasis at both lung bases. No significant pleural or pericardial effusion. There is a small hiatal hernia which appears stable. Hepatobiliary: The liver is normal in signal without steatosis, focal lesion or  abnormal enhancement. No evidence of gallstones, gallbladder wall thickening or biliary dilatation. No filling defects are  seen within the common bile duct. Pancreas: In correlation with prior CT, there are multiple calcifications within the pancreas and the main pancreatic duct consistent with chronic calcific pancreatitis. The pancreatic duct is chronically dilated, especially in the pancreatic tail where it measures up to 13 mm in diameter on coronal image 17/3. The pancreatic tail appears inflamed with surrounding edema and enhancement, tracking along the splenic hilum and left pericolic gutter. No evidence of pancreatic necrosis or hemorrhage. Spleen: Normal in size without focal abnormality.  Small splenule. Adrenals/Urinary Tract: Stable 17 mm left adrenal nodule consistent with a benign finding based on stability. The right adrenal gland appears normal. There is cortical scarring in both kidneys. No evidence of renal mass or hydronephrosis. Stomach/Bowel: No evidence of bowel wall thickening, distention or surrounding inflammatory change. Vascular/Lymphatic: There are no enlarged abdominal lymph nodes. Diffuse aortic and branch vessel atherosclerosis. The splenic vein appears chronically attenuated, but patent. The portal and superior mesenteric veins are patent. No acute vascular findings are seen. Other: No ascites or focal fluid collection. Musculoskeletal: No acute or significant osseous findings. IMPRESSION: 1. Findings are consistent with acute on chronic pancreatitis. The pancreatic tail is enlarged with surrounding edema and inflammation. No evidence of pancreatic hemorrhage or necrosis. 2. Underlying sequela of chronic pancreatitis with multiple parenchymal and ductal calcifications and chronic dilatation of the main pancreatic duct. 3. No evidence of biliary dilatation or choledocholithiasis. 4. Bilateral renal cortical scarring. Electronically Signed   By: Richardean Sale M.D.   On: 05/06/2018  17:13   Mr Abdomen Mrcp Moise Boring Contast  Result Date: 05/06/2018 CLINICAL DATA:  Abdominal pain. Suspected atypical pancreatitis. History of acute renal failure and chronic pancreatitis. EXAM: MRI ABDOMEN WITHOUT AND WITH CONTRAST (INCLUDING MRCP) TECHNIQUE: Multiplanar multisequence MR imaging of the abdomen was performed both before and after the administration of intravenous contrast. Heavily T2-weighted images of the biliary and pancreatic ducts were obtained, and three-dimensional MRCP images were rendered by post processing. CONTRAST:  44mL MULTIHANCE GADOBENATE DIMEGLUMINE 529 MG/ML IV SOLN COMPARISON:  Ultrasound 05/06/2018.  CT 03/29/2018 and 04/02/2017. FINDINGS: Lower chest: Mild atelectasis at both lung bases. No significant pleural or pericardial effusion. There is a small hiatal hernia which appears stable. Hepatobiliary: The liver is normal in signal without steatosis, focal lesion or abnormal enhancement. No evidence of gallstones, gallbladder wall thickening or biliary dilatation. No filling defects are seen within the common bile duct. Pancreas: In correlation with prior CT, there are multiple calcifications within the pancreas and the main pancreatic duct consistent with chronic calcific pancreatitis. The pancreatic duct is chronically dilated, especially in the pancreatic tail where it measures up to 13 mm in diameter on coronal image 17/3. The pancreatic tail appears inflamed with surrounding edema and enhancement, tracking along the splenic hilum and left pericolic gutter. No evidence of pancreatic necrosis or hemorrhage. Spleen: Normal in size without focal abnormality.  Small splenule. Adrenals/Urinary Tract: Stable 17 mm left adrenal nodule consistent with a benign finding based on stability. The right adrenal gland appears normal. There is cortical scarring in both kidneys. No evidence of renal mass or hydronephrosis. Stomach/Bowel: No evidence of bowel wall thickening, distention or  surrounding inflammatory change. Vascular/Lymphatic: There are no enlarged abdominal lymph nodes. Diffuse aortic and branch vessel atherosclerosis. The splenic vein appears chronically attenuated, but patent. The portal and superior mesenteric veins are patent. No acute vascular findings are seen. Other: No ascites or focal fluid collection. Musculoskeletal: No acute or significant osseous findings. IMPRESSION: 1. Findings  are consistent with acute on chronic pancreatitis. The pancreatic tail is enlarged with surrounding edema and inflammation. No evidence of pancreatic hemorrhage or necrosis. 2. Underlying sequela of chronic pancreatitis with multiple parenchymal and ductal calcifications and chronic dilatation of the main pancreatic duct. 3. No evidence of biliary dilatation or choledocholithiasis. 4. Bilateral renal cortical scarring. Electronically Signed   By: Richardean Sale M.D.   On: 05/06/2018 17:13   US Abdomen Limited Ruq  Result Date: 05/06/2018 CLINICAL DATA:  Abdominal pain.  History of pancreatitis EXAM: ULTRASOUND ABDOMEN LIMITED RIGHT UPPER QUADRANT COMPARISON:  CT abdomen and pelvis March 29, 2018 FINDINGS: Gallbladder: No gallstones or wall thickening visualized. There is no pericholecystic fluid. No sonographic Murphy sign noted by sonographer. Common bile duct: Diameter: 5 mm. There is no intrahepatic or extrahepatic biliary duct dilatation. There is an echogenic focus in the distal common bile duct measuring 7 mm which is not causing bile duct dilatation. Liver: No focal lesion identified. Within normal limits in parenchymal echogenicity. Portal vein is patent on color Doppler imaging with normal direction of blood flow towards the liver. Pancreas contains multiple calcifications consistent with chronic pancreatitis. IMPRESSION: 1. 7 mm echogenic focus in the distal common bile duct region, not causing common bile duct dilatation. Etiology of this focus is uncertain. A mass in the common  bile duct cannot be entirely excluded. This finding warrants MRCP to further evaluate the common bile duct. 2. Evidence of chronic pancreatitis with multiple pancreatic calcifications. 3.  Study otherwise unremarkable. Electronically Signed   By: Lowella Grip III M.D.   On: 05/06/2018 12:23    EKG:   Orders placed or performed during the hospital encounter of 05/06/18  . ED EKG  . ED EKG  . EKG 12-Lead  . EKG 12-Lead    ASSESSMENT AND PLAN:   52 year old male with past medical history significant for alcohol use, hypertension and chronic pancreatitis comes back with abdominal pain and elevated lipase  1.  Acute on chronic pancreatitis-likely alcohol induced. -Slowly improving.  On full liquid diet -Continue IV fluids. -Repeat lipase is pending.  2.  Hypertension-continue Norvasc and losartan  3.  Depression-on bupropion and Zoloft  4.  DVT prophylaxis-Lovenox  5.  Alcohol abuse-not going through withdrawals.  Monitor for now  Up and ambulating well.   All the records are reviewed and case discussed with Care Management/Social Workerr. Management plans discussed with the patient, family and they are in agreement.  CODE STATUS: Full Code  TOTAL TIME TAKING CARE OF THIS PATIENT: 38 minutes.   POSSIBLE D/C IN 2 DAYS, DEPENDING ON CLINICAL CONDITION.   [name] M.D on 05/07/2018 at 10:29 AM  Between 7am to 6pm - Pager - [phone]  After 6pm go to [email or web address] - password [email or web address] [facility]  Office  [phone]  CC: Primary care physician; Patient, No Pcp Per

## 2018-05-08 LAB — BASIC METABOLIC PANEL
Anion gap: 9 (ref 5–15)
BUN: 6 mg/dL (ref 6–20)
CALCIUM: 8.6 mg/dL — AB (ref 8.9–10.3)
CO2: 23 mmol/L (ref 22–32)
CREATININE: 0.97 mg/dL (ref 0.61–1.24)
Chloride: 103 mmol/L (ref 98–111)
GFR calc Af Amer: >60 mL/min (ref >60)
GFR calc non Af Amer: >60 mL/min (ref >60)
GLUCOSE: 102 mg/dL — AB (ref 70–99)
Potassium: 3.5 mmol/L (ref 3.5–5.1)
Sodium: 135 mmol/L (ref 135–145)

## 2018-05-08 LAB — GLUCOSE, CAPILLARY: Glucose-Capillary: 106 mg/dL — ABNORMAL HIGH (ref 70–99)

## 2018-05-08 LAB — LIPASE, BLOOD: Lipase: 32 U/L (ref 11–51)

## 2018-05-08 MED ORDER — BUPROPION HCL ER (SR) 150 MG PO TB12: 150.0000 mg | ORAL_TABLET | Freq: Every day | ORAL | 1 refills | Status: DC

## 2018-05-08 NOTE — Care Management (Signed)
No discharge needs identified by members of the care team 

## 2018-05-08 NOTE — Discharge Summary (Signed)
[facility] [place] NAME: Noah Velasquez    MR#:  [id number]  DATE OF BIRTH:  1966/01/10  DATE OF ADMISSION:  05/06/2018   ADMITTING PHYSICIAN: Epifanio Lesches, MD  DATE OF DISCHARGE: 05/08/2018 12:34 PM  PRIMARY CARE PHYSICIAN: Patient, No Pcp Per   ADMISSION DIAGNOSIS:   Pancreatitis [K85.90] Alcohol-induced acute pancreatitis, unspecified complication status [Y65.99]  DISCHARGE DIAGNOSIS:   Active Problems:   Acute on chronic respiratory failure ([place])   SECONDARY DIAGNOSIS:   Past Medical History:  Diagnosis Date  . Chronic hip pain   . Chronic pancreatitis ([place])   . Hypertension   . Pancreatitis     HOSPITAL COURSE:   52 year old male with past medical history significant for alcohol use, hypertension and chronic pancreatitis comes back with abdominal pain and elevated lipase  1.  Acute on chronic pancreatitis-likely alcohol induced. -has chronic pancreatitis.  Diet has been advanced.  Tolerating diet well. -Repeat lipase has improved -Advised to stay away from alcohol.  2.  Hypertension-continue Norvasc and losartan  3.  Depression-on bupropion and Zoloft  4.  Alcohol abuse-not going through withdrawals. Stable  Discharge home today   DISCHARGE CONDITIONS:   Guarded  CONSULTS OBTAINED:   None  DRUG ALLERGIES:   Allergies  Allergen Reactions  . Hctz [Hydrochlorothiazide] Swelling   DISCHARGE MEDICATIONS:   Allergies as of 05/08/2018      Reactions   Hctz [hydrochlorothiazide] Swelling      Medication List    STOP taking these medications   naproxen 500 MG tablet Commonly known as:  NAPROSYN     TAKE these medications   amLODipine 10 MG tablet Commonly known as:  NORVASC Take 10 mg by mouth daily.   atorvastatin 40 MG tablet Commonly known as:  LIPITOR Take 40 mg by mouth daily.   buPROPion 150 MG 12 hr tablet Commonly known as:  WELLBUTRIN SR Take 1 tablet (150 mg total)  by mouth daily.   fluticasone 50 MCG/ACT nasal spray Commonly known as:  FLONASE Place 1 spray into both nostrils daily as needed for allergies or rhinitis.   gabapentin 100 MG capsule Commonly known as:  NEURONTIN Take 100 mg by mouth at bedtime as needed (sleep).   loratadine 10 MG tablet Commonly known as:  CLARITIN Take 10 mg by mouth daily.   losartan 100 MG tablet Commonly known as:  COZAAR Take 100 mg by mouth daily.   ranitidine 150 MG tablet Commonly known as:  ZANTAC Take 150 mg by mouth 2 (two) times daily as needed for heartburn.   sertraline 100 MG tablet Commonly known as:  ZOLOFT Take 100 mg by mouth.   traZODone 150 MG tablet Commonly known as:  DESYREL Take 1 tablet by mouth at bedtime as needed for sleep.   ZENPEP 15000-51000 units Cpep Generic drug:  Pancrelipase (Lip-Prot-Amyl) Take 1 capsule by mouth 3 (three) times daily. With meals        DISCHARGE INSTRUCTIONS:   1. PCP f/u in 1-2 weeks  DIET:   Cardiac diet  ACTIVITY:   Activity as tolerated  OXYGEN:   Home Oxygen: No.  Oxygen Delivery: room air  DISCHARGE LOCATION:   home   If you experience worsening of your admission symptoms, develop shortness of breath, life threatening emergency, suicidal or homicidal thoughts you must seek medical attention immediately by calling 911 or calling your MD immediately  if symptoms less severe.  You Must read complete instructions/literature  along with all the possible adverse reactions/side effects for all the Medicines you take and that have been prescribed to you. Take any new Medicines after you have completely understood and accpet all the possible adverse reactions/side effects.   Please note  You were cared for by a hospitalist during your hospital stay. If you have any questions about your discharge medications or the care you received while you were in the hospital after you are discharged, you can call the unit and asked to speak  with the hospitalist on call if the hospitalist that took care of you is not available. Once you are discharged, your primary care physician will handle any further medical issues. Please note that NO REFILLS for any discharge medications will be authorized once you are discharged, as it is imperative that you return to your primary care physician (or establish a relationship with a primary care physician if you do not have one) for your aftercare needs so that they can reassess your need for medications and monitor your lab values.    On the day of Discharge:  VITAL SIGNS:   Blood pressure 132/87, pulse 84, temperature 98.1 F (36.7 C), temperature source Oral, resp. rate 20, height 5\' 7"  (1.702 m), weight 83.5 kg, SpO2 98 %.  PHYSICAL EXAMINATION:    GENERAL:  52 y.o.-year-old patient lying in the bed with no acute distress.  EYES: Pupils equal, round, reactive to light and accommodation. No scleral icterus. Extraocular muscles intact.  HEENT: Head atraumatic, normocephalic. Oropharynx and nasopharynx clear.  NECK:  Supple, no jugular venous distention. No thyroid enlargement, no tenderness.  LUNGS: Normal breath sounds bilaterally, no wheezing, rales,rhonchi or crepitation. No use of accessory muscles of respiration.  CARDIOVASCULAR: S1, S2 normal. No murmurs, rubs, or gallops.  ABDOMEN: Soft, mild epigastric pain with voluntary guarding, nondistended. Bowel sounds present. No organomegaly or mass.  EXTREMITIES: No pedal edema, cyanosis, or clubbing.  NEUROLOGIC: Cranial nerves II through XII are intact. Muscle strength 5/5 in all extremities. Sensation intact. Gait not checked.  PSYCHIATRIC: The patient is alert and oriented x 3.  SKIN: No obvious rash, lesion, or ulcer.     DATA REVIEW:   CBC Recent Labs  Lab 05/07/18 0353  WBC 9.1  HGB 11.8*  HCT 33.8*  PLT 209    Chemistries  Recent Labs  Lab 05/06/18 1005  05/08/18 0314  NA 135   < > 135  K 3.8   < > 3.5  CL 101    < > 103  CO2 26   < > 23  GLUCOSE 121*   < > 102*  BUN 11   < > 6  CREATININE 1.20   < > 0.97  CALCIUM 9.5   < > 8.6*  AST 19  --   --   ALT 11  --   --   ALKPHOS 110  --   --   BILITOT 0.8  --   --    < > = values in this interval not displayed.     Microbiology Results  Results for orders placed or performed during the hospital encounter of 03/29/18  CULTURE, BLOOD (ROUTINE X 2) w Reflex to ID Panel     Status: None   Collection Time: 03/31/18  4:51 AM  Result Value Ref Range Status   Specimen Description BLOOD RIGHT ASSIST CONTROL  Final   Special Requests   Final    BOTTLES DRAWN AEROBIC AND ANAEROBIC Blood Culture adequate volume  Culture   Final    NO GROWTH 5 DAYS Performed at [facility], [address] [place], [place] [address]    Report Status 04/05/2018 FINAL  Final  CULTURE, BLOOD (ROUTINE X 2) w Reflex to ID Panel     Status: None   Collection Time: 03/31/18  5:02 AM  Result Value Ref Range Status   Specimen Description BLOOD RIGHT HAND  Final   Special Requests   Final    BOTTLES DRAWN AEROBIC AND ANAEROBIC Blood Culture adequate volume   Culture   Final    NO GROWTH 5 DAYS Performed at [facility], [address], [place], [place] [address]    Report Status 04/05/2018 FINAL  Final  Expectorated sputum assessment w rflx to resp cult     Status: None   Collection Time: 03/31/18 12:07 PM  Result Value Ref Range Status   Specimen Description SPUTUM  Final   Special Requests NONE  Final   Sputum evaluation   Final    Sputum specimen not acceptable for testing.  Please recollect.   Performed at [facility], [address], [place],  [address]    Report Status 03/31/2018 FINAL  Final    RADIOLOGY:  No results found.   Management plans discussed with the patient, family and they are in agreement.  CODE STATUS:     Code Status Orders  (From admission, onward)         Start     Ordered   05/06/18 1328   Full code  Continuous     05/06/18 1330        Code Status History    Date Active Date Inactive Code Status Order ID Comments User Context   03/29/2018 1109 04/04/2018 1616 Full Code [id number]  Dustin Flock, MD Inpatient   04/03/2017 0314 04/04/2017 1741 Full Code [id number]  Saundra Shelling, MD Inpatient   04/29/2016 1122 04/30/2016 1847 Full Code [id number]  Hillary Bow, MD ED      TOTAL TIME TAKING CARE OF THIS PATIENT: 37 minutes.    [name] M.D on 05/08/2018 at 1:18 PM  Between 7am to 6pm - Pager - [phone]  After 6pm go to [email or web address] - Proofreader  [facility]  Office  [phone]  CC: Primary care physician; Patient, No Pcp Per   Note: This dictation was prepared with Dragon dictation along with smaller phrase technology. Any transcriptional errors that result from this process are unintentional.

## 2018-05-08 NOTE — Progress Notes (Signed)
Pt being discharged home, discharge instructions reviewed with pt, states understanding, pt with no complaints 

## 2018-05-09 ENCOUNTER — Ambulatory Visit (INDEPENDENT_AMBULATORY_CARE_PROVIDER_SITE_OTHER): Payer: Medicaid Other | Admitting: Nurse Practitioner

## 2018-05-09 ENCOUNTER — Other Ambulatory Visit: Payer: Self-pay

## 2018-05-09 ENCOUNTER — Encounter: Payer: Self-pay | Admitting: Nurse Practitioner

## 2018-05-09 VITALS — BP 112/75 | HR 88 | Temp 98.3°F | Resp 18 | Ht 67.0 in | Wt 176.6 lb

## 2018-05-09 DIAGNOSIS — Z596 Low income: Secondary | ICD-10-CM | POA: Diagnosis not present

## 2018-05-09 DIAGNOSIS — M87051 Idiopathic aseptic necrosis of right femur: Secondary | ICD-10-CM

## 2018-05-09 DIAGNOSIS — Z9181 History of falling: Secondary | ICD-10-CM

## 2018-05-09 DIAGNOSIS — K860 Alcohol-induced chronic pancreatitis: Secondary | ICD-10-CM | POA: Diagnosis not present

## 2018-05-09 DIAGNOSIS — I10 Essential (primary) hypertension: Secondary | ICD-10-CM

## 2018-05-09 DIAGNOSIS — Z7689 Persons encountering health services in other specified circumstances: Secondary | ICD-10-CM

## 2018-05-09 DIAGNOSIS — K219 Gastro-esophageal reflux disease without esophagitis: Secondary | ICD-10-CM | POA: Diagnosis not present

## 2018-05-09 MED ORDER — ADJUST BATH/SHOWER SEAT/BACK MISC: 1.0000 | Freq: Every day | 0 refills | Status: DC

## 2018-05-09 MED ORDER — GABAPENTIN 100 MG PO CAPS: 100.0000 mg | ORAL_CAPSULE | Freq: Every evening | ORAL | 1 refills | Status: DC | PRN

## 2018-05-09 MED ORDER — RANITIDINE HCL 150 MG PO TABS: 150.0000 mg | ORAL_TABLET | Freq: Two times a day (BID) | ORAL | 1 refills | Status: DC | PRN

## 2018-05-09 MED ORDER — PANCRELIPASE (LIP-PROT-AMYL) 15000-47000 UNITS PO CPEP: 1.0000 | ORAL_CAPSULE | Freq: Three times a day (TID) | ORAL | 5 refills | Status: DC

## 2018-05-09 MED ORDER — LOSARTAN POTASSIUM 100 MG PO TABS: 100.0000 mg | ORAL_TABLET | Freq: Every day | ORAL | 1 refills | Status: DC

## 2018-05-09 MED ORDER — AMLODIPINE BESYLATE 10 MG PO TABS: 10.0000 mg | ORAL_TABLET | Freq: Every day | ORAL | 1 refills | Status: DC

## 2018-05-09 NOTE — Patient Instructions (Addendum)
Noah Velasquez,   Thank you for coming in to clinic today.  1. Your provider would like to you have your annual eye exam. Please contact your current eye doctor or here are some good options for you to contact.   Call us if you have decided to see an eye doctor and need a referral.  [facility]   Address: [address] [place], [place] [address] Phone: [phone]  Website: [email or web address] [address] [place], [place] [address] Phone: [phone] [email or web address]  [facility]  Address: [place], [place], [place] [address] Phone: [phone]   [facility] [address] [place], [place] [address] Phone: [phone]  [facility] Address: [place], [place], [place] [address]  Phone: [phone]   2.PSYCHIATRY / [place] Self Referral [facility]) [name] [address] [place], [place] [address] Phone: [phone]  [facility], available walk-in 9am-4pm M-F [place], [place] [address] Hours: [place] (M-F, walk in available) [phone]  Please schedule a follow-up appointment with Cassell Smiles, AGNP. Return in about 3 months (around 08/08/2018) for hypertension, avascular necrosis RIGHT hip.   If you have any other questions or concerns, please feel free to call the clinic or send a message through [place]. You may also schedule an earlier appointment if necessary.  You will receive a survey after today's visit either digitally by e-mail or paper by [employer]. Your experiences and feedback matter to [place].  Please respond so we know how we are doing as we provide care for you.   Cassell Smiles, DNP, AGNP-BC Adult Gerontology Nurse Practitioner [place]

## 2018-05-09 NOTE — Progress Notes (Signed)
Subjective:    Patient ID: Noah Velasquez, male    DOB: 05-27-1966, 52 y.o.   MRN: [id number]  Noah Velasquez is a 52 y.o. 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  Obtain records from [facility].   - Notable history for patient of Left eye - chronic viral infection  R hip pain -  Wheelchair - stands on scale well.  More mobile with transfers. Has had several falls in shower because of pain.  Pain is worst with standing. He requests shower seat today. - Has planned in past to have hip replacement.  Has not recently seen orthopedics.   - Was discharged home yesterday from acute pancreatitis.  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 - On his way out, he requests possible PCS for assistance with cleaning and cooking 2-3 days per week.  Currently patient states he is doing these things himself, but with difficulty.    PTSD Needs psychiatry.  Has been connected in past and requests referral. Had previously seen Dr. Nevada Crane and Scharlene Gloss.  He had previously been on Zoloft and Wellbutrin.  Past Medical History:  Diagnosis Date  . Chronic hip pain   . Chronic pancreatitis ([place])   . GERD (gastroesophageal reflux disease)   . Hyperlipidemia   . Hypertension   . Osteoarthritis   . Pancreatitis   . Pancreatitis   . PTSD (post-traumatic stress disorder)   . Viral infection of left eye    Past Surgical History:  Procedure Laterality Date  . pancreatic stent placement     Social History   Socioeconomic History  . Marital status: Legally Separated    Spouse name: Not on file  . Number of children: Not on file  . Years of education: Not on file  . Highest education level: High school graduate  Occupational History  . Occupation: on disability  Social Needs  . Financial resource strain: Not on file  . Food insecurity:   Worry: Not on file    Inability: Not on file  . Transportation needs:    Medical: Not on file    Non-medical: Not on file  Tobacco Use  . Smoking status: Current Every Day Smoker    Packs/day: 0.25    Years: 15.00    Pack years: 3.75    Types: Cigarettes  . Smokeless tobacco: Never Used  Substance and Sexual Activity  . Alcohol use: Yes    Comment: about a six pack a week   . Drug use: Yes    Types: Marijuana  . Sexual activity: Yes    Birth control/protection: Condom  Lifestyle  . Physical activity:    Days per week: Not on file    Minutes per session: Not on file  . Stress: Not on file  Relationships  . Social connections:    Talks on phone: Not on file    Gets together: Not on file    Attends religious service: Not on file    Active member of club or organization: Not on file    Attends meetings of clubs or organizations: Not on file    Relationship status: Not on file  . Intimate partner violence:    Fear of current or ex partner: Not on file    Emotionally abused: Not on file  Physically abused: Not on file    Forced sexual activity: Not on file  Other Topics Concern  . Not on file  Social History Narrative  . Not on file   Family History  Problem Relation Age of Onset  . Diabetes Mother   . Sleep apnea Mother   . Emphysema Sister   . CAD Neg Hx    Current Outpatient Medications on File Prior to Visit  Medication Sig  . buPROPion (WELLBUTRIN SR) 150 MG 12 hr tablet Take 1 tablet (150 mg total) by mouth daily.  . fluticasone (FLONASE) 50 MCG/ACT nasal spray Place 1 spray into both nostrils daily as needed for allergies or rhinitis.  Marland Kitchen loratadine (CLARITIN) 10 MG tablet Take 10 mg by mouth daily.  . sertraline (ZOLOFT) 100 MG tablet Take 100 mg by mouth daily.   . traZODone (DESYREL) 150 MG tablet Take 1 tablet by mouth at bedtime as needed for sleep.   Marland Kitchen atorvastatin (LIPITOR) 40 MG tablet Take 40 mg by mouth daily.   No current facility-administered  medications on file prior to visit.     Review of Systems  Musculoskeletal: Positive for arthralgias.  Neurological: Positive for weakness.   Per HPI unless specifically indicated above     Objective:    BP 112/75 (BP Location: Right Arm, Patient Position: Sitting, Cuff Size: Normal)   Pulse 88   Temp 98.3 F (36.8 C) (Oral)   Resp 18   Ht 5\' 7"  (1.702 m)   Wt 176 lb 9.6 oz (80.1 kg)   SpO2 99%   BMI 27.66 kg/m   Wt Readings from Last 3 Encounters:  06/09/18 177 lb 11.2 oz (80.6 kg)  05/09/18 176 lb 9.6 oz (80.1 kg)  05/08/18 184 lb (83.5 kg)    Physical Exam  Constitutional: He is oriented to person, place, and time. He appears well-developed and well-nourished. No distress.  HENT:  Head: Normocephalic and atraumatic.  Neurological: He is alert and oriented to person, place, and time. Gait (pt is using wheelchair for mobility) abnormal.  Skin: Skin is warm and dry.  Psychiatric: His speech is normal and behavior is normal.  Vitals reviewed.  Results for orders placed or performed during the hospital encounter of 05/06/18  Lipase, blood  Result Value Ref Range   Lipase 140 (H) 11 - 51 U/L  Comprehensive metabolic panel  Result Value Ref Range   Sodium 135 135 - 145 mmol/L   Potassium 3.8 3.5 - 5.1 mmol/L   Chloride 101 98 - 111 mmol/L   CO2 26 22 - 32 mmol/L   Glucose, Bld 121 (H) 70 - 99 mg/dL   BUN 11 6 - 20 mg/dL   Creatinine, Ser 1.20 0.61 - 1.24 mg/dL   Calcium 9.5 8.9 - 10.3 mg/dL   Total Protein 8.6 (H) 6.5 - 8.1 g/dL   Albumin 3.9 3.5 - 5.0 g/dL   AST 19 15 - 41 U/L   ALT 11 0 - 44 U/L   Alkaline Phosphatase 110 38 - 126 U/L   Total Bilirubin 0.8 0.3 - 1.2 mg/dL   GFR calc non Af Amer >60 >60 mL/min   GFR calc Af Amer >60 >60 mL/min   Anion gap 8 5 - 15  CBC  Result Value Ref Range   WBC 7.6 3.8 - 10.6 K/uL   RBC 4.50 4.40 - 5.90 MIL/uL   Hemoglobin 13.9 13.0 - 18.0 g/dL   HCT 40.2 40.0 - 52.0 %   MCV  89.5 80.0 - 100.0 fL   MCH 31.0 26.0 -  34.0 pg   MCHC 34.6 32.0 - 36.0 g/dL   RDW 15.0 (H) 11.5 - 14.5 %   Platelets 263 150 - 440 K/uL  Urinalysis, Complete w Microscopic  Result Value Ref Range   Color, Urine STRAW (A) YELLOW   APPearance CLEAR (A) CLEAR   Specific Gravity, Urine 1.006 1.005 - 1.030   pH 7.0 5.0 - 8.0   Glucose, UA NEGATIVE NEGATIVE mg/dL   Hgb urine dipstick NEGATIVE NEGATIVE   Bilirubin Urine NEGATIVE NEGATIVE   Ketones, ur NEGATIVE NEGATIVE mg/dL   Protein, ur NEGATIVE NEGATIVE mg/dL   Nitrite NEGATIVE NEGATIVE   Leukocytes, UA NEGATIVE NEGATIVE   RBC / HPF 0-5 0 - 5 RBC/hpf   WBC, UA 0-5 0 - 5 WBC/hpf   Bacteria, UA NONE SEEN NONE SEEN   Squamous Epithelial / LPF NONE SEEN 0 - 5  Troponin I  Result Value Ref Range   Troponin I <0.03 <0.03 ng/mL  Basic metabolic panel  Result Value Ref Range   Sodium 135 135 - 145 mmol/L   Potassium 3.7 3.5 - 5.1 mmol/L   Chloride 104 98 - 111 mmol/L   CO2 24 22 - 32 mmol/L   Glucose, Bld 121 (H) 70 - 99 mg/dL   BUN 8 6 - 20 mg/dL   Creatinine, Ser 1.08 0.61 - 1.24 mg/dL   Calcium 8.4 (L) 8.9 - 10.3 mg/dL   GFR calc non Af Amer >60 >60 mL/min   GFR calc Af Amer >60 >60 mL/min   Anion gap 7 5 - 15  CBC  Result Value Ref Range   WBC 9.1 3.8 - 10.6 K/uL   RBC 3.74 (L) 4.40 - 5.90 MIL/uL   Hemoglobin 11.8 (L) 13.0 - 18.0 g/dL   HCT 33.8 (L) 40.0 - 52.0 %   MCV 90.3 80.0 - 100.0 fL   MCH 31.4 26.0 - 34.0 pg   MCHC 34.8 32.0 - 36.0 g/dL   RDW 14.9 (H) 11.5 - 14.5 %   Platelets 209 150 - 440 K/uL  Glucose, capillary  Result Value Ref Range   Glucose-Capillary 131 (H) 70 - 99 mg/dL  Lipase, blood  Result Value Ref Range   Lipase 67 (H) 11 - 51 U/L  Basic metabolic panel  Result Value Ref Range   Sodium 135 135 - 145 mmol/L   Potassium 3.5 3.5 - 5.1 mmol/L   Chloride 103 98 - 111 mmol/L   CO2 23 22 - 32 mmol/L   Glucose, Bld 102 (H) 70 - 99 mg/dL   BUN 6 6 - 20 mg/dL   Creatinine, Ser 0.97 0.61 - 1.24 mg/dL   Calcium 8.6 (L) 8.9 - 10.3 mg/dL    GFR calc non Af Amer >60 >60 mL/min   GFR calc Af Amer >60 >60 mL/min   Anion gap 9 5 - 15  Lipase, blood  Result Value Ref Range   Lipase 32 11 - 51 U/L  Glucose, capillary  Result Value Ref Range   Glucose-Capillary 106 (H) 70 - 99 mg/dL      Assessment & Plan:   Problem List Items Addressed This Visit      Digestive   Pancreatitis Recent hospital discharge.  Patient requests refill for his pancrealipase.  Refill provided.  Recommends patient continue follow-up with his GI provider in future.  Follow-up prn.    Other Visit Diagnoses    Avascular necrosis of bone  of right hip ([place])    -  Primary Patient with impaired gait, reported avascular necrosis of Right hip.  Now with weakness and falls, more often occurring in shower.  Continues having daily pain with use of gabapentin.  Refill provided.  Order placed for shower seat. Provide 3-in-1 shower seat.  - Xrays ordered for assessing current status of R hip - Referral orthopedic surgery for further evaluation and treatment.  Patient may be candidate for hip replacement. - Follow-up prn.   Relevant Medications   Misc. Devices (ADJUST BATH/SHOWER SEAT/BACK) MISC   gabapentin (NEURONTIN) 100 MG capsule   Other Relevant Orders   DG HIP UNILAT W OR W/O PELVIS 2-3 VIEWS RIGHT   Ambulatory referral to Orthopedic Surgery   Encounter to establish care     Previous PCP was at [facility].  Records are reviewed in [facility].  Past medical, family, and surgical history reviewed w/ pt.     Essential hypertension     Patient with currently controlled blood pressure. Needs refills.  Refills provided for amlodipine and losartan.  Labs from hospitalization reviewed.  Follow-up 3 months.   Relevant Medications   amLODipine (NORVASC) 10 MG tablet   losartan (COZAAR) 100 MG tablet   Gastroesophageal reflux disease, esophagitis presence not specified     Stable without signs of bleeding.  Patient requests refill of PPI.  Refill provided.  Follow-up 3 months.      At high risk for falls     See Avascular necrosis above.   Relevant Medications   Misc. Devices (ADJUST BATH/SHOWER SEAT/BACK) MISC      Meds ordered this encounter  Medications  . Misc. Devices (ADJUST BATH/SHOWER SEAT/BACK) MISC    Sig: 1 Device by Does not apply route daily.    Dispense:  1 each    Refill:  0    Order Specific Question:   Supervising Provider    Answer:   Olin Hauser [[id number]]  . amLODipine (NORVASC) 10 MG tablet    Sig: Take 1 tablet (10 mg total) by mouth daily.    Dispense:  90 tablet    Refill:  1    Order Specific Question:   Supervising Provider    Answer:   Olin Hauser [[id number]]  . gabapentin (NEURONTIN) 100 MG capsule    Sig: Take 1 capsule (100 mg total) by mouth at bedtime as needed (sleep).    Dispense:  90 capsule    Refill:  1    Order Specific Question:   Supervising Provider    Answer:   Olin Hauser [[id number]]  . losartan (COZAAR) 100 MG tablet    Sig: Take 1 tablet (100 mg total) by mouth daily.    Dispense:  90 tablet    Refill:  1    Order Specific Question:   Supervising Provider    Answer:   Olin Hauser [[id number]]  .  ranitidine (ZANTAC) 150 MG tablet    Sig: Take 1 tablet (150 mg total) by mouth 2 (two) times daily as needed for heartburn.    Dispense:  90 tablet    Refill:  1    Order Specific Question:   Supervising Provider    Answer:   Olin Hauser [[id number]]  .  Pancrelipase, Lip-Prot-Amyl, (ZENPEP) 16109-60454 units CPEP    Sig: Take 1 capsule by mouth 3 (three) times daily with meals.    Dispense:  90 capsule    Refill:  5    Order  Specific Question:   Supervising Provider    Answer:   Olin Hauser [[id number]]     Follow up plan: Return in about 3 months (around 08/08/2018) for hypertension, avascular necrosis RIGHT hip.  Cassell Smiles, DNP, AGPCNP-BC Adult Gerontology Primary Care Nurse Practitioner [facility] 05/09/2018, 10:33 AM

## 2018-05-17 ENCOUNTER — Telehealth: Payer: Self-pay | Admitting: Nurse Practitioner

## 2018-05-17 DIAGNOSIS — K219 Gastro-esophageal reflux disease without esophagitis: Secondary | ICD-10-CM

## 2018-05-17 NOTE — Telephone Encounter (Signed)
Attempted to contact the pt, no answer. lmom to return my call.  

## 2018-05-17 NOTE — Telephone Encounter (Signed)
Pt has concerns about ranitidine.  Please call [phone]

## 2018-05-18 MED ORDER — FAMOTIDINE 20 MG PO TABS: 20.0000 mg | ORAL_TABLET | Freq: Two times a day (BID) | ORAL | 2 refills | Status: DC

## 2018-05-18 NOTE — Telephone Encounter (Signed)
Yes,  Patient may take Pepcid OTC instead of Zantac (ranitidine).  He should also call his pharmacy to see if his medication lot was recalled.

## 2018-05-18 NOTE — Addendum Note (Signed)
Addended by: Cleaster Corin on: 05/18/2018 03:36 PM   Modules accepted: Orders

## 2018-05-18 NOTE — Telephone Encounter (Signed)
Patient was advised wants generic of Nexium.

## 2018-05-18 NOTE — Telephone Encounter (Signed)
Nexium was not discussed as an option.  Will need to see patient in clinic.  Will send Pepcid.

## 2018-05-18 NOTE — Telephone Encounter (Signed)
Patient heard from news that one of ingredient use to make ranitidine can cause Cancer so can he get alternative ? Please suggest.

## 2018-05-27 DIAGNOSIS — Z596 Low income: Secondary | ICD-10-CM | POA: Diagnosis not present

## 2018-05-27 DIAGNOSIS — M87852 Other osteonecrosis, left femur: Secondary | ICD-10-CM | POA: Diagnosis not present

## 2018-05-27 DIAGNOSIS — M87851 Other osteonecrosis, right femur: Secondary | ICD-10-CM | POA: Diagnosis not present

## 2018-06-03 ENCOUNTER — Telehealth: Payer: Self-pay | Admitting: Family Medicine

## 2018-06-03 ENCOUNTER — Telehealth: Payer: Self-pay | Admitting: Nurse Practitioner

## 2018-06-03 DIAGNOSIS — Z596 Low income: Secondary | ICD-10-CM | POA: Diagnosis not present

## 2018-06-03 DIAGNOSIS — M87051 Idiopathic aseptic necrosis of right femur: Secondary | ICD-10-CM

## 2018-06-03 DIAGNOSIS — M87851 Other osteonecrosis, right femur: Secondary | ICD-10-CM | POA: Diagnosis not present

## 2018-06-03 MED ORDER — NAPROXEN 500 MG PO TABS: 500.0000 mg | ORAL_TABLET | Freq: Two times a day (BID) | ORAL | 0 refills | Status: DC | PRN

## 2018-06-03 NOTE — Telephone Encounter (Signed)
Pt needs a refill on (NAPROSYN sent to [facility] [phone]

## 2018-06-03 NOTE — Telephone Encounter (Signed)
Received form from [facility] for Pre-Op medical clearance for upcoming hip surgery (R-Total hip arthroplasty) by Dr Harlow Mares, anticipated TBD for December 2019.  Please contact this patient to notify him that he needs to schedule a Pre-Op visit with Cassell Smiles, AGPCNP-BC within next 1-3 weeks to complete this paperwork. This form will be placed in Lauren's mail/inbox for now.  Forward message to PCP.  Noah Putnam, DO [facility] 06/03/2018, 5:20 PM

## 2018-06-06 NOTE — Telephone Encounter (Signed)
Front staff will call patient to schedule an appointment.

## 2018-06-07 ENCOUNTER — Ambulatory Visit: Payer: Self-pay | Admitting: Orthopedic Surgery

## 2018-06-09 ENCOUNTER — Other Ambulatory Visit: Payer: Self-pay

## 2018-06-09 ENCOUNTER — Inpatient Hospital Stay
Admission: EM | Admit: 2018-06-09 | Discharge: 2018-06-12 | DRG: 439 | Disposition: A | Discharge status: Home / Self Care | Payer: Medicaid Other | Attending: Family Medicine | Admitting: Family Medicine

## 2018-06-09 ENCOUNTER — Emergency Department: Payer: Medicaid Other

## 2018-06-09 ENCOUNTER — Encounter: Payer: Self-pay | Admitting: Emergency Medicine

## 2018-06-09 DIAGNOSIS — R1084 Generalized abdominal pain: Secondary | ICD-10-CM | POA: Diagnosis not present

## 2018-06-09 DIAGNOSIS — Z888 Allergy status to other drugs, medicaments and biological substances status: Secondary | ICD-10-CM | POA: Diagnosis not present

## 2018-06-09 DIAGNOSIS — F329 Major depressive disorder, single episode, unspecified: Secondary | ICD-10-CM | POA: Diagnosis present

## 2018-06-09 DIAGNOSIS — R1110 Vomiting, unspecified: Secondary | ICD-10-CM | POA: Diagnosis not present

## 2018-06-09 DIAGNOSIS — K859 Acute pancreatitis without necrosis or infection, unspecified: Secondary | ICD-10-CM | POA: Diagnosis not present

## 2018-06-09 DIAGNOSIS — M25559 Pain in unspecified hip: Secondary | ICD-10-CM | POA: Diagnosis present

## 2018-06-09 DIAGNOSIS — Z79899 Other long term (current) drug therapy: Secondary | ICD-10-CM

## 2018-06-09 DIAGNOSIS — F17210 Nicotine dependence, cigarettes, uncomplicated: Secondary | ICD-10-CM | POA: Diagnosis present

## 2018-06-09 DIAGNOSIS — K863 Pseudocyst of pancreas: Secondary | ICD-10-CM | POA: Diagnosis not present

## 2018-06-09 DIAGNOSIS — G8929 Other chronic pain: Secondary | ICD-10-CM | POA: Diagnosis present

## 2018-06-09 DIAGNOSIS — K852 Alcohol induced acute pancreatitis without necrosis or infection: Secondary | ICD-10-CM | POA: Diagnosis not present

## 2018-06-09 DIAGNOSIS — G629 Polyneuropathy, unspecified: Secondary | ICD-10-CM | POA: Diagnosis present

## 2018-06-09 DIAGNOSIS — K861 Other chronic pancreatitis: Secondary | ICD-10-CM | POA: Diagnosis present

## 2018-06-09 DIAGNOSIS — R112 Nausea with vomiting, unspecified: Secondary | ICD-10-CM | POA: Diagnosis not present

## 2018-06-09 DIAGNOSIS — I10 Essential (primary) hypertension: Secondary | ICD-10-CM | POA: Diagnosis present

## 2018-06-09 DIAGNOSIS — M1990 Unspecified osteoarthritis, unspecified site: Secondary | ICD-10-CM | POA: Diagnosis present

## 2018-06-09 DIAGNOSIS — E785 Hyperlipidemia, unspecified: Secondary | ICD-10-CM | POA: Diagnosis present

## 2018-06-09 DIAGNOSIS — R109 Unspecified abdominal pain: Secondary | ICD-10-CM | POA: Diagnosis not present

## 2018-06-09 DIAGNOSIS — K862 Cyst of pancreas: Secondary | ICD-10-CM | POA: Diagnosis present

## 2018-06-09 DIAGNOSIS — R1033 Periumbilical pain: Secondary | ICD-10-CM | POA: Diagnosis not present

## 2018-06-09 LAB — CBC
HEMATOCRIT: 39.3 % (ref 39.0–52.0)
Hemoglobin: 13 g/dL (ref 13.0–17.0)
MCH: 29.6 pg (ref 26.0–34.0)
MCHC: 33.1 g/dL (ref 30.0–36.0)
MCV: 89.5 fL (ref 80.0–100.0)
Platelets: 331 10*3/uL (ref 150–400)
RBC: 4.39 MIL/uL (ref 4.22–5.81)
RDW: 14.8 % (ref 11.5–15.5)
WBC: 13.9 10*3/uL — ABNORMAL HIGH (ref 4.0–10.5)
nRBC: 0 % (ref 0.0–0.2)

## 2018-06-09 LAB — COMPREHENSIVE METABOLIC PANEL
ALBUMIN: 4 g/dL (ref 3.5–5.0)
ALT: 11 U/L (ref 0–44)
AST: 17 U/L (ref 15–41)
Alkaline Phosphatase: 86 U/L (ref 38–126)
Anion gap: 6 (ref 5–15)
BUN: 17 mg/dL (ref 6–20)
CO2: 23 mmol/L (ref 22–32)
CREATININE: 1.29 mg/dL — AB (ref 0.61–1.24)
Calcium: 8.9 mg/dL (ref 8.9–10.3)
Chloride: 104 mmol/L (ref 98–111)
GFR calc non Af Amer: >60 mL/min (ref >60)
GLUCOSE: 128 mg/dL — AB (ref 70–99)
Potassium: 4.1 mmol/L (ref 3.5–5.1)
SODIUM: 133 mmol/L — AB (ref 135–145)
Total Bilirubin: 0.6 mg/dL (ref 0.3–1.2)
Total Protein: 8.2 g/dL — ABNORMAL HIGH (ref 6.5–8.1)

## 2018-06-09 LAB — URINALYSIS, COMPLETE (UACMP) WITH MICROSCOPIC
Bacteria, UA: NONE SEEN
Bilirubin Urine: NEGATIVE
GLUCOSE, UA: NEGATIVE mg/dL
HGB URINE DIPSTICK: NEGATIVE
KETONES UR: NEGATIVE mg/dL
LEUKOCYTES UA: NEGATIVE
Nitrite: NEGATIVE
PROTEIN: NEGATIVE mg/dL
Specific Gravity, Urine: >1.046 — ABNORMAL HIGH (ref 1.005–1.030)
Squamous Epithelial / LPF: NONE SEEN (ref 0–5)
pH: 5 (ref 5.0–8.0)

## 2018-06-09 LAB — LIPASE, BLOOD: Lipase: 72 U/L — ABNORMAL HIGH (ref 11–51)

## 2018-06-09 MED ORDER — BUPROPION HCL ER (SR) 150 MG PO TB12
150.0000 mg | ORAL_TABLET | Freq: Every day | ORAL | Status: DC
  Administered 2018-06-10 – 2018-06-12 (×3): 150 mg via ORAL
  Filled 2018-06-09 (×3): qty 1

## 2018-06-09 MED ORDER — KETOROLAC TROMETHAMINE 30 MG/ML IJ SOLN
15.0000 mg | Freq: Once | INTRAMUSCULAR | Status: AC
  Administered 2018-06-09: 15 mg via INTRAVENOUS
  Filled 2018-06-09: qty 1

## 2018-06-09 MED ORDER — FLUTICASONE PROPIONATE 50 MCG/ACT NA SUSP
1.0000 | Freq: Every day | NASAL | Status: DC | PRN
  Filled 2018-06-09: qty 16

## 2018-06-09 MED ORDER — LORATADINE 10 MG PO TABS
10.0000 mg | ORAL_TABLET | Freq: Every day | ORAL | Status: DC
  Administered 2018-06-10 – 2018-06-12 (×3): 10 mg via ORAL
  Filled 2018-06-09 (×3): qty 1

## 2018-06-09 MED ORDER — MORPHINE SULFATE (PF) 2 MG/ML IV SOLN
2.0000 mg | INTRAVENOUS | Status: DC | PRN
  Administered 2018-06-09 – 2018-06-11 (×6): 2 mg via INTRAVENOUS
  Filled 2018-06-09 (×6): qty 1

## 2018-06-09 MED ORDER — KETOROLAC TROMETHAMINE 30 MG/ML IJ SOLN
30.0000 mg | Freq: Four times a day (QID) | INTRAMUSCULAR | Status: DC | PRN
  Administered 2018-06-10: 30 mg via INTRAVENOUS
  Filled 2018-06-09: qty 1

## 2018-06-09 MED ORDER — SODIUM CHLORIDE 0.9 % IV SOLN
Freq: Once | INTRAVENOUS | Status: AC
  Administered 2018-06-09: 20:00:00 via INTRAVENOUS

## 2018-06-09 MED ORDER — SERTRALINE HCL 50 MG PO TABS
100.0000 mg | ORAL_TABLET | Freq: Every day | ORAL | Status: DC
  Administered 2018-06-10 – 2018-06-12 (×3): 100 mg via ORAL
  Filled 2018-06-09 (×3): qty 2

## 2018-06-09 MED ORDER — FAMOTIDINE 20 MG PO TABS
20.0000 mg | ORAL_TABLET | Freq: Two times a day (BID) | ORAL | Status: DC
  Administered 2018-06-10 – 2018-06-12 (×5): 20 mg via ORAL
  Filled 2018-06-09 (×6): qty 1

## 2018-06-09 MED ORDER — MORPHINE SULFATE (PF) 4 MG/ML IV SOLN
4.0000 mg | INTRAVENOUS | Status: DC | PRN
  Administered 2018-06-09: 4 mg via INTRAVENOUS
  Filled 2018-06-09: qty 1

## 2018-06-09 MED ORDER — SODIUM CHLORIDE 0.9 % IV BOLUS
1000.0000 mL | Freq: Once | INTRAVENOUS | Status: AC
  Administered 2018-06-09: 1000 mL via INTRAVENOUS

## 2018-06-09 MED ORDER — ONDANSETRON HCL 4 MG/2ML IJ SOLN: 4.0000 mg | Freq: Four times a day (QID) | INTRAMUSCULAR | Status: DC | PRN

## 2018-06-09 MED ORDER — AMLODIPINE BESYLATE 5 MG PO TABS
10.0000 mg | ORAL_TABLET | Freq: Every day | ORAL | Status: DC
  Administered 2018-06-10 – 2018-06-11 (×2): 10 mg via ORAL
  Filled 2018-06-09 (×2): qty 2

## 2018-06-09 MED ORDER — LOSARTAN POTASSIUM 50 MG PO TABS
100.0000 mg | ORAL_TABLET | Freq: Every day | ORAL | Status: DC
  Administered 2018-06-10 – 2018-06-11 (×2): 100 mg via ORAL
  Filled 2018-06-09 (×2): qty 2

## 2018-06-09 MED ORDER — ACETAMINOPHEN 650 MG RE SUPP: 650.0000 mg | Freq: Four times a day (QID) | RECTAL | Status: DC | PRN

## 2018-06-09 MED ORDER — IOPAMIDOL (ISOVUE-300) INJECTION 61%
100.0000 mL | Freq: Once | INTRAVENOUS | Status: AC | PRN
  Administered 2018-06-09: 100 mL via INTRAVENOUS

## 2018-06-09 MED ORDER — PANCRELIPASE (LIP-PROT-AMYL) 12000-38000 UNITS PO CPEP
24000.0000 [IU] | ORAL_CAPSULE | Freq: Three times a day (TID) | ORAL | Status: DC
  Administered 2018-06-10 (×2): 24000 [IU] via ORAL
  Filled 2018-06-09 (×6): qty 2

## 2018-06-09 MED ORDER — ATORVASTATIN CALCIUM 20 MG PO TABS
40.0000 mg | ORAL_TABLET | Freq: Every day | ORAL | Status: DC
  Administered 2018-06-10: 40 mg via ORAL
  Filled 2018-06-09 (×2): qty 2

## 2018-06-09 MED ORDER — ACETAMINOPHEN 325 MG PO TABS
650.0000 mg | ORAL_TABLET | Freq: Four times a day (QID) | ORAL | Status: DC | PRN
  Administered 2018-06-10: 650 mg via ORAL
  Filled 2018-06-09: qty 2

## 2018-06-09 MED ORDER — GABAPENTIN 100 MG PO CAPS
100.0000 mg | ORAL_CAPSULE | Freq: Every evening | ORAL | Status: DC | PRN
  Administered 2018-06-09: 100 mg via ORAL
  Filled 2018-06-09: qty 1

## 2018-06-09 MED ORDER — PROMETHAZINE HCL 25 MG/ML IJ SOLN: 12.5000 mg | Freq: Four times a day (QID) | INTRAMUSCULAR | Status: DC | PRN

## 2018-06-09 MED ORDER — TRAZODONE HCL 50 MG PO TABS
150.0000 mg | ORAL_TABLET | Freq: Every evening | ORAL | Status: DC | PRN
  Administered 2018-06-09: 150 mg via ORAL
  Filled 2018-06-09: qty 1

## 2018-06-09 MED ORDER — ONDANSETRON HCL 4 MG PO TABS
4.0000 mg | ORAL_TABLET | Freq: Four times a day (QID) | ORAL | Status: DC | PRN
  Administered 2018-06-10: 4 mg via ORAL
  Filled 2018-06-09: qty 1

## 2018-06-09 MED ORDER — ENOXAPARIN SODIUM 40 MG/0.4ML ~~LOC~~ SOLN
40.0000 mg | SUBCUTANEOUS | Status: DC
  Administered 2018-06-09 – 2018-06-11 (×3): 40 mg via SUBCUTANEOUS
  Filled 2018-06-09 (×3): qty 0.4

## 2018-06-09 NOTE — H&P (Signed)
Noah Velasquez at [place] NAME: Noah Velasquez    MR#:  [id number]  DATE OF BIRTH:  09-22-1965  DATE OF ADMISSION:  06/09/2018  PRIMARY CARE PHYSICIAN: Noah College, NP   REQUESTING/REFERRING PHYSICIAN: Dr. Merlyn Lot  CHIEF COMPLAINT:   Chief Complaint  Patient presents with  . Abdominal Pain    HISTORY OF PRESENT ILLNESS:  Noah Velasquez  is a 52 y.o. male with a known history of chronic pancreatitis, alcohol abuse, hypertension, hyperlipidemia, osteoarthritis who presents to the hospital due to abdominal pain nausea and vomiting.  Patient says he developed worsening abdominal pain nausea vomiting that began yesterday and has progressively gotten worse.  He had multiple episodes of vomiting today which was bilious in nature.  He was able to eat a slice of bread and also drink some water but has not been able to keep anything else down.  His pain is in the middle of his abdomen nonradiating and not associate with any diarrhea, melena, hematochezia or any other associated symptoms.  Patient presents to the hospital underwent a CT scan of his abdomen pelvis which is suggestive of acute on chronic pancreatitis with a pancreatic pseudocyst.  Hospitalist services were contacted for admission.  PAST MEDICAL HISTORY:   Past Medical History:  Diagnosis Date  . Chronic hip pain   . Chronic pancreatitis (Noah Velasquez)   . Hyperlipidemia   . Hypertension   . Osteoarthritis   . Pancreatitis     PAST SURGICAL HISTORY:   Past Surgical History:  Procedure Laterality Date  . pancreatic stent placement      SOCIAL HISTORY:   Social History   Tobacco Use  . Smoking status: Current Every Day Smoker    Packs/day: 0.25    Years: 15.00    Pack years: 3.75    Types: Cigarettes  . Smokeless tobacco: Never Used  Substance Use Topics  . Alcohol use: Yes    Comment: about a six pack a week     FAMILY HISTORY:   Family History  Problem  Relation Age of Onset  . Diabetes Mother   . Sleep apnea Mother   . Emphysema Sister   . CAD Neg Hx     DRUG ALLERGIES:   Allergies  Allergen Reactions  . Hctz [Hydrochlorothiazide] Swelling    REVIEW OF SYSTEMS:   Review of Systems  Constitutional: Negative for fever and weight loss.  HENT: Negative for congestion, nosebleeds and tinnitus.   Eyes: Negative for blurred vision, double vision and redness.  Respiratory: Negative for cough, hemoptysis and shortness of breath.   Cardiovascular: Negative for chest pain, orthopnea, leg swelling and PND.  Gastrointestinal: Positive for abdominal pain, nausea and vomiting. Negative for diarrhea and melena.  Genitourinary: Negative for dysuria, hematuria and urgency.  Musculoskeletal: Negative for falls and joint pain.  Neurological: Negative for dizziness, tingling, sensory change, focal weakness, seizures, weakness and headaches.  Endo/Heme/Allergies: Negative for polydipsia. Does not bruise/bleed easily.  Psychiatric/Behavioral: Negative for depression and memory loss. The patient is not nervous/anxious.     MEDICATIONS AT HOME:   Prior to Admission medications   Medication Sig Start Date End Date Taking? Authorizing Provider  amLODipine (NORVASC) 10 MG tablet Take 1 tablet (10 mg total) by mouth daily. 05/09/18  Yes Noah College, NP  atorvastatin (LIPITOR) 40 MG tablet Take 40 mg by mouth daily.   Yes [provider]  buPROPion (WELLBUTRIN SR) 150 MG 12 hr tablet Take  1 tablet (150 mg total) by mouth daily. 05/08/18  Yes Noah Lighter, MD  famotidine (PEPCID) 20 MG tablet Take 1 tablet (20 mg total) by mouth 2 (two) times daily. 05/18/18  Yes Noah College, NP  fluticasone [facility]) 50 MCG/ACT nasal spray Place 1 spray into both nostrils daily as needed for allergies or rhinitis.   Yes [provider]  gabapentin (NEURONTIN) 100 MG capsule Take 1 capsule (100 mg total) by mouth at bedtime as  needed (sleep). 05/09/18  Yes Noah College, NP  loratadine (CLARITIN) 10 MG tablet Take 10 mg by mouth daily.   Yes [provider]  losartan (COZAAR) 100 MG tablet Take 1 tablet (100 mg total) by mouth daily. 05/09/18  Yes Noah College, NP  naproxen (NAPROSYN) 500 MG tablet Take 1 tablet (500 mg total) by mouth 2 (two) times daily as needed. 06/03/18  Yes Karamalegos, Devonne Doughty, DO  Pancrelipase, Lip-Prot-Amyl, (ZENPEP) 48185-63149 units CPEP Take 1 capsule by mouth 3 (three) times daily with meals. 05/09/18  Yes Noah College, NP  sertraline (ZOLOFT) 100 MG tablet Take 100 mg by mouth daily.  03/11/16 03/30/19 Yes [provider]  traZODone (DESYREL) 150 MG tablet Take 1 tablet by mouth at bedtime as needed for sleep.  03/11/16  Yes [provider]  [place]. Devices (ADJUST BATH/SHOWER SEAT/BACK) MISC 1 Device by Does not apply route daily. 05/09/18   Noah College, NP      VITAL SIGNS:  Blood pressure 130/86, pulse 90, temperature 98.3 F (36.8 C), temperature source Oral, resp. rate 19, height 5\' 7"  (1.702 m), weight 81.6 kg, SpO2 95 %.  PHYSICAL EXAMINATION:  Physical Exam  GENERAL:  52 y.o.-year-old patient lying in the bed in no acute distress.  EYES: Pupils equal, round, reactive to light and accommodation. No scleral icterus. Extraocular muscles intact.  HEENT: Head atraumatic, normocephalic. Oropharynx and nasopharynx clear. No oropharyngeal erythema, moist oral mucosa  NECK:  Supple, no jugular venous distention. No thyroid enlargement, no tenderness.  LUNGS: Normal breath sounds bilaterally, no wheezing, rales, rhonchi. No use of accessory muscles of respiration.  CARDIOVASCULAR: S1, S2 RRR. No murmurs, rubs, gallops, clicks.  ABDOMEN: Soft, tender in the epigastric area, no rebound no rigidity, nondistended. Bowel sounds present. No organomegaly or mass.  EXTREMITIES: No pedal edema, cyanosis, or clubbing. + 2 pedal & radial  pulses b/l.   NEUROLOGIC: Cranial nerves II through XII are intact. No focal Motor or sensory deficits appreciated b/l PSYCHIATRIC: The patient is alert and oriented x 3. Good affect.  SKIN: No obvious rash, lesion, or ulcer.   LABORATORY PANEL:   CBC Recent Labs  Lab 06/09/18 1532  WBC 13.9*  HGB 13.0  HCT 39.3  PLT 331   ------------------------------------------------------------------------------------------------------------------  Chemistries  Recent Labs  Lab 06/09/18 1532  NA 133*  K 4.1  CL 104  CO2 23  GLUCOSE 128*  BUN 17  CREATININE 1.29*  CALCIUM 8.9  AST 17  ALT 11  ALKPHOS 86  BILITOT 0.6   ------------------------------------------------------------------------------------------------------------------  Cardiac Enzymes No results for input(s): TROPONINI in the last 168 hours. ------------------------------------------------------------------------------------------------------------------  RADIOLOGY:  Ct Abdomen Pelvis W Contrast  Result Date: 06/09/2018 CLINICAL DATA:  52 year old male with acute abdominal and pelvic pain with nausea and vomiting today. History of pancreatitis. EXAM: CT ABDOMEN AND PELVIS WITH CONTRAST TECHNIQUE: Multidetector CT imaging of the abdomen and pelvis was performed using the standard protocol following bolus administration of intravenous contrast. CONTRAST:  167mL  ISOVUE-300 IOPAMIDOL (ISOVUE-300) INJECTION 61% COMPARISON:  03/29/2018 CT and 05/06/2018 MR FINDINGS: Lower chest: No acute abnormality. Mild bibasilar atelectasis/scarring again noted. Hepatobiliary: The liver and gallbladder are unremarkable. No CBD or intrahepatic biliary dilatation. Pancreas: Peripancreatic inflammation is identified. A 1.5 x 4.5 cm ill-defined area of fluid anterior to the pancreatic body compatible with acute fluid collection or developing pseudocyst. Chronic pancreatic calcifications are again noted as well as dilatation of the distal  pancreatic duct. There is no evidence of pancreatic necrosis. Spleen: Unremarkable Adrenals/Urinary Tract: Scattered areas of renal scarring are again noted. No hydronephrosis or obstructing urinary calculi. The adrenal glands and bladder are unremarkable except for stable small LEFT adrenal adenomas. Stomach/Bowel: No evidence of bowel obstruction. Mild wall thickening of the lesser curvature of the stomach is likely reactive from adjacent pancreatitis. No other bowel wall thickening identified. The appendix is normal. Vascular/Lymphatic: Aortic atherosclerotic calcifications noted without aneurysm. Enlarged bilateral inguinal lymph nodes are unchanged from 2018. Reproductive: Prostate is unremarkable. Other: No ascites, abscess or pneumoperitoneum. Musculoskeletal: No acute or suspicious bony abnormalities. 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 IMPRESSION: 1. Acute pancreatitis superimposed on chronic calcific pancreatitis. 1.5 x 4.5 cm ill-defined acute fluid collection/developing pseudocyst along the anterior pancreatic body. No evidence of pancreatic necrosis or abscess. 2.  Aortic Atherosclerosis (ICD10-I70.0). Electronically Signed   By: Margarette Canada M.D.   On: 06/09/2018 18:46     IMPRESSION AND PLAN:   52 year old male with past medical history of alcohol abuse, chronic pancreatitis, osteoarthritis, hypertension, hyperlipidemia who presents to the hospital due to abdominal pain nausea and vomiting.  1.  Acute on chronic pancreatitis- this is a cause of patient's worsening abdominal pain nausea vomiting.  Patient although denies any recent alcohol abuse. - Supportive care with IV fluids, antiemetics, pain control.  Follow LFTs and lipase in a.m. - We will get gastroenterology consult.  2.  Essential hypertension-continue Norvasc, losartan.  3.  Hyperlipidemia-continue atorvastatin.  4.  Depression-continue Wellbutrin.  5.  History of chronic pancreatitis-continue  pancreatic lipase supplements.  6.  Neuropathy-continue gabapentin.    All the records are reviewed and case discussed with ED provider. Management plans discussed with the patient, family and they are in agreement.  CODE STATUS: Full code  TOTAL TIME TAKING CARE OF THIS PATIENT: 45 minutes.    Henreitta Leber M.D on 06/09/2018 at 8:18 PM  Between 7am to 6pm - Pager - [phone]  After 6pm go to [email or web address] - password [email or web address] [email or web address]  Office  [phone]  CC: Primary care physician; Noah College, NP

## 2018-06-09 NOTE — ED Notes (Signed)
EMS pt to lobby , abd pian , EMS gave IV , 79mcg Fentanyl and 4mg  zofran

## 2018-06-09 NOTE — ED Provider Notes (Signed)
[facility] Emergency Department Provider Note    First MD Initiated Contact with Patient 06/09/18 1736     (approximate)  I have reviewed the triage vital signs and the nursing notes.   HISTORY  Chief Complaint Abdominal Pain    HPI Noah Velasquez is a 52 y.o. male who presents to the ER with recurrent generalized abdominal pain periumbilical in nature radiating to right lower quadrant that started last night.  Patient with a history of pancreatitis that I saw last month for similar symptoms but this is in a different location.  States he is no longer drinking alcohol but does still smoke.  No new medications.  No fevers.  No hematemesis or coffee-ground emesis.  No melena.   MRCP IMPRESSION: 1. Findings are consistent with acute on chronic pancreatitis. The pancreatic tail is enlarged with surrounding edema and inflammation. No evidence of pancreatic hemorrhage or necrosis. 2. Underlying sequela of chronic pancreatitis with multiple parenchymal and ductal calcifications and chronic dilatation of the main pancreatic duct. 3. No evidence of biliary dilatation or choledocholithiasis. 4. Bilateral renal cortical scarring.   Past Medical History:  Diagnosis Date  . Chronic hip pain   . Chronic pancreatitis ([place])   . Hyperlipidemia   . Hypertension   . Osteoarthritis   . Pancreatitis    Family History  Problem Relation Age of Onset  . Diabetes Mother   . Sleep apnea Mother   . Emphysema Sister   . CAD Neg Hx    Past Surgical History:  Procedure Laterality Date  . pancreatic stent placement     Patient Active Problem List   Diagnosis Date Noted  . Acute on chronic respiratory failure ([place]) 05/06/2018  . Acute renal failure (ARF) ([place]) 04/03/2017  . Pancreatitis 04/29/2016      Prior to Admission medications   Medication Sig Start Date End Date Taking? Authorizing Provider  amLODipine (NORVASC) 10 MG tablet Take 1 tablet (10 mg total)  by mouth daily. 05/09/18   Mikey College, NP  atorvastatin (LIPITOR) 40 MG tablet Take 40 mg by mouth daily.    [provider]  buPROPion (WELLBUTRIN SR) 150 MG 12 hr tablet Take 1 tablet (150 mg total) by mouth daily. 05/08/18   [name], MD  famotidine (PEPCID) 20 MG tablet Take 1 tablet (20 mg total) by mouth 2 (two) times daily. 05/18/18   Mikey College, NP  fluticasone (FLONASE) 50 MCG/ACT nasal spray Place 1 spray into both nostrils daily as needed for allergies or rhinitis.    [provider]  gabapentin (NEURONTIN) 100 MG capsule Take 1 capsule (100 mg total) by mouth at bedtime as needed (sleep). 05/09/18   Mikey College, NP  loratadine (CLARITIN) 10 MG tablet Take 10 mg by mouth daily.    [provider]  losartan (COZAAR) 100 MG tablet Take 1 tablet (100 mg total) by mouth daily. 05/09/18   Mikey College, NP  Misc. Devices (ADJUST BATH/SHOWER SEAT/BACK) MISC 1 Device by Does not apply route daily. 05/09/18   Mikey College, NP  naproxen (NAPROSYN) 500 MG tablet Take 1 tablet (500 mg total) by mouth 2 (two) times daily as needed. 06/03/18   Karamalegos, Devonne Doughty, DO  Pancrelipase, Lip-Prot-Amyl, (ZENPEP) 15000-47000 units CPEP Take 1 capsule by mouth 3 (three) times daily with meals. 05/09/18   Mikey College, NP  sertraline (ZOLOFT) 100 MG tablet Take 100 mg by mouth. 03/11/16 03/30/19  [provider]  traZODone (DESYREL) 150 MG tablet Take 1 tablet by mouth at bedtime as needed for sleep.  03/11/16   [provider]    Allergies Hctz [hydrochlorothiazide]    Social History Social History   Tobacco Use  . Smoking status: Current Every Day Smoker    Packs/day: 0.25    Types: Cigarettes  . Smokeless tobacco: Never Used  Substance Use Topics  . Alcohol use: Yes    Comment: about a six pack a week   . Drug use: Yes    Types: Marijuana    Review of Systems Patient denies  headaches, rhinorrhea, blurry vision, numbness, shortness of breath, chest pain, edema, cough, abdominal pain, nausea, vomiting, diarrhea, dysuria, fevers, rashes or hallucinations unless otherwise stated above in HPI. ____________________________________________   PHYSICAL EXAM:  VITAL SIGNS: Vitals:   06/09/18 1829 06/09/18 1830  BP:  130/86  Pulse: 90   Resp:    Temp:    SpO2: 95%     Constitutional: Alert and oriented.  Eyes: Conjunctivae are normal.  Head: Atraumatic. Nose: No congestion/rhinnorhea. Mouth/Throat: Mucous membranes are moist.   Neck: No stridor. Painless ROM.  Cardiovascular: Normal rate, regular rhythm. Grossly normal heart sounds.  Good peripheral circulation. Respiratory: Normal respiratory effort.  No retractions. Lungs CTAB. Gastrointestinal: Soft with periumbilical and RLQ ttp.   No distention. No abdominal bruits. No CVA tenderness. Genitourinary: deferred Musculoskeletal: No lower extremity tenderness nor edema.  No joint effusions. Neurologic:  Normal speech and language. No gross focal neurologic deficits are appreciated. No facial droop Skin:  Skin is warm, dry and intact. No rash noted. Psychiatric: Mood and affect are normal. Speech and behavior are normal.  ____________________________________________   LABS (all labs ordered are listed, but only abnormal results are displayed)  Results for orders placed or performed during the hospital encounter of 06/09/18 (from the past 24 hour(s))  Lipase, blood     Status: Abnormal   Collection Time: 06/09/18  3:32 PM  Result Value Ref Range   Lipase 72 (H) 11 - 51 U/L  Comprehensive metabolic panel     Status: Abnormal   Collection Time: 06/09/18  3:32 PM  Result Value Ref Range   Sodium 133 (L) 135 - 145 mmol/L   Potassium 4.1 3.5 - 5.1 mmol/L   Chloride 104 98 - 111 mmol/L   CO2 23 22 - 32 mmol/L   Glucose, Bld 128 (H) 70 - 99 mg/dL   BUN 17 6 - 20 mg/dL   Creatinine, Ser 1.29 (H) 0.61 - 1.24  mg/dL   Calcium 8.9 8.9 - 10.3 mg/dL   Total Protein 8.2 (H) 6.5 - 8.1 g/dL   Albumin 4.0 3.5 - 5.0 g/dL   AST 17 15 - 41 U/L   ALT 11 0 - 44 U/L   Alkaline Phosphatase 86 38 - 126 U/L   Total Bilirubin 0.6 0.3 - 1.2 mg/dL   GFR calc non Af Amer >60 >60 mL/min   GFR calc Af Amer >60 >60 mL/min   Anion gap 6 5 - 15  CBC     Status: Abnormal   Collection Time: 06/09/18  3:32 PM  Result Value Ref Range   WBC 13.9 (H) 4.0 - 10.5 K/uL   RBC 4.39 4.22 - 5.81 MIL/uL   Hemoglobin 13.0 13.0 - 17.0 g/dL   HCT 39.3 39.0 - 52.0 %   MCV 89.5 80.0 - 100.0 fL   MCH 29.6 26.0 - 34.0 pg   MCHC 33.1 30.0 -  36.0 g/dL   RDW 14.8 11.5 - 15.5 %   Platelets 331 150 - 400 K/uL   nRBC 0.0 0.0 - 0.2 %   ____________________________________________ ____________________________________________  RADIOLOGY  I personally reviewed all radiographic images ordered to evaluate for the above acute complaints and reviewed radiology reports and findings.  These findings were personally discussed with the patient.  Please see medical record for radiology report.  ____________________________________________   PROCEDURES  Procedure(s) performed:  Procedures    Critical Care performed: no ____________________________________________   INITIAL IMPRESSION / ASSESSMENT AND PLAN / ED COURSE  Pertinent labs & imaging results that were available during my care of the patient were reviewed by me and considered in my medical decision making (see chart for details).   DDX: Pancreatitis, enteritis, appendicitis, cholelithiasis, colitis, diverticulitis  Noah Velasquez is a 52 y.o. who presents to the ED with symptoms as described above.  Patient with no leukocytosis.  Pain does seem more right lower quadrant nature therefore CT imaging will be ordered to evaluate for the above differential.  We will give IV fluids as well as IV pain medication. The patient will be placed on continuous pulse oximetry and telemetry for  monitoring.  Laboratory evaluation will be sent to evaluate for the above complaints.     Clinical Course as of Jun 09 1948  Thu Jun 09, 2018  1947 CT imaging does not show any evidence of acute appendicitis but does show persistent pancreatitis with interval development of pseudocyst.  Discussed case with Dr.Anna.  No indication for drainage at this time but as the patient is still having pain with leukocytosis as recommended observation rule out infected pseudocyst changes for pain control and bowel rest.  Patient will be made n.p.o. with IV fluids for bowel rest.  Have discussed with the patient and available family all diagnostics and treatments performed thus far and all questions were answered to the best of my ability. The patient demonstrates understanding and agreement with plan.    [PR]    Clinical Course User Index [PR] Merlyn Lot, MD     As part of my medical decision making, I reviewed the following data within the [facility] notes reviewed and incorporated, Labs reviewed, notes from prior ED visits and [place] Controlled Substance Database   ____________________________________________   FINAL CLINICAL IMPRESSION(S) / ED DIAGNOSES  Final diagnoses:  Acute abdominal pain  Alcohol-induced acute pancreatitis, unspecified complication status      NEW MEDICATIONS STARTED DURING THIS VISIT:  New Prescriptions   No medications on file     Note:  This document was prepared using Dragon voice recognition software and may include unintentional dictation errors.    Merlyn Lot, MD 06/09/18 1949

## 2018-06-09 NOTE — ED Notes (Signed)
Pt to lobby via EMS reporting pancreatitis since 2015. Lower abd pain and worsening nausea and vomiting today that has improved with zofran and 400cc NS with EMS. 58mcg fentanyl given as well.  Vomit is reported to be bile colored.

## 2018-06-09 NOTE — ED Notes (Signed)
Patient transported to CT 

## 2018-06-09 NOTE — ED Notes (Signed)
Pt informed of needing urine sample  

## 2018-06-09 NOTE — ED Notes (Signed)
Sam RN, aware of bed assigned  

## 2018-06-09 NOTE — ED Triage Notes (Signed)
Pt presents to ED via [facility] with c/o generalized abdominal pain. Pt states hx of pancreatitis, states is vomiting "thick yellow stinky stuff". Pt states pain started last night.

## 2018-06-09 NOTE — ED Notes (Signed)
ED TO INPATIENT HANDOFF REPORT  Name/Age/Gender Noah Velasquez 52 y.o. male  Code Status    Code Status Orders  (From admission, onward)         Start     Ordered   06/09/18 2057  Full code  Continuous     06/09/18 2056        Code Status History    Date Active Date Inactive Code Status Order ID Comments User Context   05/06/2018 1330 05/08/2018 1540 Full Code [id number]  Epifanio Lesches, MD ED   03/29/2018 1109 04/04/2018 1616 Full Code [id number]  Dustin Flock, MD Inpatient   04/03/2017 0314 04/04/2017 1741 Full Code [id number]  Saundra Shelling, MD Inpatient   04/29/2016 1122 04/30/2016 1847 Full Code [id number]  Hillary Bow, MD ED      Home/SNF/Other Home  Chief Complaint abd pain  Level of Care/Admitting Diagnosis ED Disposition    ED Disposition Condition [place]: [place] [[id number]]  Level of Care: Med-Surg [16]  Diagnosis: Acute on chronic pancreatitis [facility]) [[id number]]  Admitting Physician: Henreitta Leber [[id number]]  Attending Physician: Henreitta Leber [[id number]]  Estimated length of stay: past midnight tomorrow  Certification:: I certify this patient will need inpatient services for at least 2 midnights  PT Class (Do Not Modify): Inpatient [101]  PT Acc Code (Do Not Modify): Private [1]       Medical History Past Medical History:  Diagnosis Date  . Chronic hip pain   . Chronic pancreatitis ([place])   . Hyperlipidemia   . Hypertension   . Osteoarthritis   . Pancreatitis     Allergies Allergies  Allergen Reactions  . Hctz [Hydrochlorothiazide] Swelling    IV Location/Drains/Wounds Patient Lines/Drains/Airways Status   Active Line/Drains/Airways    Name:   Placement date:   Placement time:   Site:   Days:   Peripheral IV 06/09/18 Left Antecubital   06/09/18    1534    Antecubital   less than 1          Labs/Imaging Results for orders placed or performed during the hospital encounter of 06/09/18  (from the past 48 hour(s))  Lipase, blood     Status: Abnormal   Collection Time: 06/09/18  3:32 PM  Result Value Ref Range   Lipase 72 (H) 11 - 51 U/L    Comment: Performed at [facility], [address] [place], [place] [address]  Comprehensive metabolic panel     Status: Abnormal   Collection Time: 06/09/18  3:32 PM  Result Value Ref Range   Sodium 133 (L) 135 - 145 mmol/L   Potassium 4.1 3.5 - 5.1 mmol/L   Chloride 104 98 - 111 mmol/L   CO2 23 22 - 32 mmol/L   Glucose, Bld 128 (H) 70 - 99 mg/dL   BUN 17 6 - 20 mg/dL   Creatinine, Ser 1.29 (H) 0.61 - 1.24 mg/dL   Calcium 8.9 8.9 - 10.3 mg/dL   Total Protein 8.2 (H) 6.5 - 8.1 g/dL   Albumin 4.0 3.5 - 5.0 g/dL   AST 17 15 - 41 U/L   ALT 11 0 - 44 U/L   Alkaline Phosphatase 86 38 - 126 U/L   Total Bilirubin 0.6 0.3 - 1.2 mg/dL   GFR calc non Af Amer >60 >60 mL/min   GFR calc Af Amer >60 >60 mL/min    Comment: (NOTE) The eGFR has been calculated using the CKD EPI equation.  This calculation has not been validated in all clinical situations. eGFR's persistently <60 mL/min signify possible Chronic Kidney Disease.    Anion gap 6 5 - 15    Comment: Performed at [facility], [address] [place], [place] [address]  CBC     Status: Abnormal   Collection Time: 06/09/18  3:32 PM  Result Value Ref Range   WBC 13.9 (H) 4.0 - 10.5 K/uL   RBC 4.39 4.22 - 5.81 MIL/uL   Hemoglobin 13.0 13.0 - 17.0 g/dL   HCT 39.3 39.0 - 52.0 %   MCV 89.5 80.0 - 100.0 fL   MCH 29.6 26.0 - 34.0 pg   MCHC 33.1 30.0 - 36.0 g/dL   RDW 14.8 11.5 - 15.5 %   Platelets 331 150 - 400 K/uL   nRBC 0.0 0.0 - 0.2 %    Comment: Performed at [facility], [address] [place], [place] [address]  Urinalysis, Complete w Microscopic     Status: Abnormal   Collection Time: 06/09/18  8:25 PM  Result Value Ref Range   Color, Urine YELLOW (A) YELLOW   APPearance CLEAR (A) CLEAR   Specific Gravity, Urine >1.046 (H) 1.005 - 1.030    pH 5.0 5.0 - 8.0   Glucose, UA NEGATIVE NEGATIVE mg/dL   Hgb urine dipstick NEGATIVE NEGATIVE   Bilirubin Urine NEGATIVE NEGATIVE   Ketones, ur NEGATIVE NEGATIVE mg/dL   Protein, ur NEGATIVE NEGATIVE mg/dL   Nitrite NEGATIVE NEGATIVE   Leukocytes, UA NEGATIVE NEGATIVE   RBC / HPF 0-5 0 - 5 RBC/hpf   WBC, UA 0-5 0 - 5 WBC/hpf   Bacteria, UA NONE SEEN NONE SEEN   Squamous Epithelial / LPF NONE SEEN 0 - 5   Mucus PRESENT     Comment: Performed at [facility], [address], [place], [place] [address]   Ct Abdomen Pelvis W Contrast  Result Date: 06/09/2018 CLINICAL DATA:  52 year old male with acute abdominal and pelvic pain with nausea and vomiting today. History of pancreatitis. EXAM: CT ABDOMEN AND PELVIS WITH CONTRAST TECHNIQUE: Multidetector CT imaging of the abdomen and pelvis was performed using the standard protocol following bolus administration of intravenous contrast. CONTRAST:  177m ISOVUE-300 IOPAMIDOL (ISOVUE-300) INJECTION 61% COMPARISON:  03/29/2018 CT and 05/06/2018 MR FINDINGS: Lower chest: No acute abnormality. Mild bibasilar atelectasis/scarring again noted. Hepatobiliary: The liver and gallbladder are unremarkable. No CBD or intrahepatic biliary dilatation. Pancreas: Peripancreatic inflammation is identified. A 1.5 x 4.5 cm ill-defined area of fluid anterior to the pancreatic body compatible with acute fluid collection or developing pseudocyst. Chronic pancreatic calcifications are again noted as well as dilatation of the distal pancreatic duct. There is no evidence of pancreatic necrosis. Spleen: Unremarkable Adrenals/Urinary Tract: Scattered areas of renal scarring are again noted. No hydronephrosis or obstructing urinary calculi. The adrenal glands and bladder are unremarkable except for stable small LEFT adrenal adenomas. Stomach/Bowel: No evidence of bowel obstruction. Mild wall thickening of the lesser curvature of the stomach is likely reactive from adjacent  pancreatitis. No other bowel wall thickening identified. The appendix is normal. Vascular/Lymphatic: Aortic atherosclerotic calcifications noted without aneurysm. Enlarged bilateral inguinal lymph nodes are unchanged from 2018. Reproductive: Prostate is unremarkable. Other: No ascites, abscess or pneumoperitoneum. Musculoskeletal: No acute or suspicious bony abnormalities. 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 IMPRESSION: 1. Acute pancreatitis superimposed on chronic calcific pancreatitis. 1.5 x 4.5 cm ill-defined acute fluid collection/developing pseudocyst along the anterior pancreatic body. No evidence of  pancreatic necrosis or abscess. 2.  Aortic Atherosclerosis (ICD10-I70.0). Electronically Signed   By: Margarette Canada M.D.   On: 06/09/2018 18:46    Pending Labs Unresulted Labs (From admission, onward)    Start     Ordered   06/16/18 0500  Creatinine, serum  (enoxaparin (LOVENOX)    CrCl >/= 30 ml/min)  Weekly,   STAT    Comments:  while on enoxaparin therapy    06/09/18 2056   06/10/18 0500  CBC  Tomorrow morning,   STAT     06/09/18 2056   06/10/18 0500  Comprehensive metabolic panel  Tomorrow morning,   STAT     06/09/18 2056   06/10/18 0500  Lipase, blood  Tomorrow morning,   STAT     06/09/18 2056   06/09/18 2057  CBC  (enoxaparin (LOVENOX)    CrCl >/= 30 ml/min)  Once,   STAT    Comments:  Baseline for enoxaparin therapy IF NOT ALREADY DRAWN.  Notify MD if PLT < 100 K.    06/09/18 2056   06/09/18 2057  Creatinine, serum  (enoxaparin (LOVENOX)    CrCl >/= 30 ml/min)  Once,   STAT    Comments:  Baseline for enoxaparin therapy IF NOT ALREADY DRAWN.    06/09/18 2056          Vitals/Pain Today's Vitals   06/09/18 2000 06/09/18 2009 06/09/18 2015 06/09/18 2030  BP: (!) 146/91   113/82  Pulse: 100  (!) 104 93  Resp:      Temp:      TempSrc:      SpO2: 96%  95% 96%  Weight:      Height:      PainSc:  8       Isolation Precautions No active  isolations  Medications Medications  promethazine (PHENERGAN) injection 12.5 mg (has no administration in time range)  amLODipine (NORVASC) tablet 10 mg (has no administration in time range)  atorvastatin (LIPITOR) tablet 40 mg (has no administration in time range)  losartan (COZAAR) tablet 100 mg (has no administration in time range)  buPROPion (WELLBUTRIN SR) 12 hr tablet 150 mg (has no administration in time range)  sertraline (ZOLOFT) tablet 100 mg (has no administration in time range)  traZODone (DESYREL) tablet 150 mg (has no administration in time range)  famotidine (PEPCID) tablet 20 mg (has no administration in time range)  Pancrelipase (Lip-Prot-Amyl) 15000-47000 units CPEP 1 capsule (has no administration in time range)  gabapentin (NEURONTIN) capsule 100 mg (has no administration in time range)  fluticasone (FLONASE) 50 MCG/ACT nasal spray 1 spray (has no administration in time range)  loratadine (CLARITIN) tablet 10 mg (has no administration in time range)  acetaminophen (TYLENOL) tablet 650 mg (has no administration in time range)    Or  acetaminophen (TYLENOL) suppository 650 mg (has no administration in time range)  ondansetron (ZOFRAN) tablet 4 mg (has no administration in time range)    Or  ondansetron (ZOFRAN) injection 4 mg (has no administration in time range)  enoxaparin (LOVENOX) injection 40 mg (has no administration in time range)  ketorolac (TORADOL) 30 MG/ML injection 30 mg (has no administration in time range)  morphine 2 MG/ML injection 2 mg (has no administration in time range)  sodium chloride 0.9 % bolus 1,000 mL (0 mLs Intravenous Stopped 06/09/18 2005)  iopamidol (ISOVUE-300) 61 % injection 100 mL (100 mLs Intravenous Contrast Given 06/09/18 1810)  0.9 %  sodium chloride infusion ( Intravenous New Bag/Given  06/09/18 2010)  ketorolac (TORADOL) 30 MG/ML injection 15 mg (15 mg Intravenous Given 06/09/18 2010)    Mobility walks

## 2018-06-09 NOTE — ED Notes (Signed)
Report given to John RN.

## 2018-06-09 NOTE — ED Notes (Signed)
ED Disposition    ED Disposition Condition [place] [facility]: [place] [100120]  Level of Care: Med-Surg [16]  Diagnosis: Acute on chronic pancreatitis [facility]) [[id number]]  Admitting Physician: Henreitta Leber [[id number]]  Attending Physician: Henreitta Leber [[id number]]  Estimated length of stay: past midnight tomorrow  Certification:: I certify this patient will need inpatient services for at least 2 midnights  PT Class (Do Not Modify): Inpatient [101]  PT Acc Code (Do Not Modify): Private [1]

## 2018-06-10 DIAGNOSIS — K859 Acute pancreatitis without necrosis or infection, unspecified: Principal | ICD-10-CM

## 2018-06-10 DIAGNOSIS — K861 Other chronic pancreatitis: Secondary | ICD-10-CM

## 2018-06-10 DIAGNOSIS — K852 Alcohol induced acute pancreatitis without necrosis or infection: Secondary | ICD-10-CM

## 2018-06-10 DIAGNOSIS — K863 Pseudocyst of pancreas: Secondary | ICD-10-CM

## 2018-06-10 LAB — COMPREHENSIVE METABOLIC PANEL
ALBUMIN: 3.1 g/dL — AB (ref 3.5–5.0)
ALK PHOS: 67 U/L (ref 38–126)
ALT: 9 U/L (ref 0–44)
AST: 11 U/L — ABNORMAL LOW (ref 15–41)
Anion gap: 7 (ref 5–15)
BUN: 17 mg/dL (ref 6–20)
CHLORIDE: 106 mmol/L (ref 98–111)
CO2: 23 mmol/L (ref 22–32)
CREATININE: 1.37 mg/dL — AB (ref 0.61–1.24)
Calcium: 8.1 mg/dL — ABNORMAL LOW (ref 8.9–10.3)
GFR calc non Af Amer: 58 mL/min — ABNORMAL LOW (ref >60)
Glucose, Bld: 97 mg/dL (ref 70–99)
Potassium: 3.9 mmol/L (ref 3.5–5.1)
Sodium: 136 mmol/L (ref 135–145)
Total Bilirubin: 0.7 mg/dL (ref 0.3–1.2)
Total Protein: 7 g/dL (ref 6.5–8.1)

## 2018-06-10 LAB — CBC
HCT: 33.3 % — ABNORMAL LOW (ref 39.0–52.0)
HEMOGLOBIN: 10.9 g/dL — AB (ref 13.0–17.0)
MCH: 29.9 pg (ref 26.0–34.0)
MCHC: 32.7 g/dL (ref 30.0–36.0)
MCV: 91.5 fL (ref 80.0–100.0)
NRBC: 0 % (ref 0.0–0.2)
PLATELETS: 245 10*3/uL (ref 150–400)
RBC: 3.64 MIL/uL — AB (ref 4.22–5.81)
RDW: 14.9 % (ref 11.5–15.5)
WBC: 10.7 10*3/uL — ABNORMAL HIGH (ref 4.0–10.5)

## 2018-06-10 LAB — LIPASE, BLOOD: LIPASE: 60 U/L — AB (ref 11–51)

## 2018-06-10 MED ORDER — NICOTINE 14 MG/24HR TD PT24
14.0000 mg | MEDICATED_PATCH | Freq: Every day | TRANSDERMAL | Status: DC
  Administered 2018-06-10 – 2018-06-12 (×3): 14 mg via TRANSDERMAL
  Filled 2018-06-10 (×3): qty 1

## 2018-06-10 MED ORDER — SODIUM CHLORIDE 0.9 % IV SOLN: INTRAVENOUS | Status: DC

## 2018-06-10 MED ORDER — TRAMADOL HCL 50 MG PO TABS: 50.0000 mg | ORAL_TABLET | Freq: Four times a day (QID) | ORAL | Status: DC | PRN

## 2018-06-10 MED ORDER — TRAMADOL HCL 50 MG PO TABS
50.0000 mg | ORAL_TABLET | Freq: Four times a day (QID) | ORAL | Status: DC | PRN
  Administered 2018-06-10: 100 mg via ORAL
  Filled 2018-06-10: qty 2

## 2018-06-10 NOTE — Consult Note (Signed)
SURGICAL CONSULTATION NOTE (initial)  HISTORY OF PRESENT ILLNESS (HPI):  52 y.o. male presented to [facility] ED on 10/24/19for evaluation of abdominal pain. Patient reports acute onset of generalized abdominal pain the night prior to his presentation which started periumbilically and radiated to his RLQ. He endorses associated nausea and emesis. His PO intake has been decreased since the onset of symptoms. He denies any associated fevers, chills, chest pain, SOB, bowel changes, urinary changes, or peripheral edema. He does have a history of pancreatitis but the pain is in a different locations this time. Has stopped drinking since last hospital presentation. Work up in the ED revealed a lipase of 72, WBC of 13.9, sCr of 1.29 and was concerning for acute on chronic pancreatitis with possible pancreatic psuedocyst. He was admitted to medicine with gastroenterology on consultation.   Surgery is consulted by hospitalist physician Dr. Nicholes Mango, MD in this context for evaluation and management of acute on chronic pancreatitis and possible pancreatic psuedocyst.   PAST MEDICAL HISTORY (PMH):  Past Medical History:  Diagnosis Date  . Chronic hip pain   . Chronic pancreatitis ([place])   . Hyperlipidemia   . Hypertension   . Osteoarthritis   . Pancreatitis      PAST SURGICAL HISTORY [facility]):  Past Surgical History:  Procedure Laterality Date  . pancreatic stent placement       MEDICATIONS:  Prior to Admission medications   Medication Sig Start Date End Date Taking? Authorizing Provider  amLODipine (NORVASC) 10 MG tablet Take 1 tablet (10 mg total) by mouth daily. 05/09/18  Yes Mikey College, NP  atorvastatin (LIPITOR) 40 MG tablet Take 40 mg by mouth daily.   Yes [provider]  buPROPion (WELLBUTRIN SR) 150 MG 12 hr tablet Take 1 tablet (150 mg total) by mouth daily. 05/08/18  Yes [name], MD  famotidine (PEPCID) 20 MG tablet Take 1 tablet (20 mg total) by mouth 2 (two)  times daily. 05/18/18  Yes Mikey College, NP  fluticasone [facility]) 50 MCG/ACT nasal spray Place 1 spray into both nostrils daily as needed for allergies or rhinitis.   Yes [provider]  gabapentin (NEURONTIN) 100 MG capsule Take 1 capsule (100 mg total) by mouth at bedtime as needed (sleep). 05/09/18  Yes Mikey College, NP  loratadine (CLARITIN) 10 MG tablet Take 10 mg by mouth daily.   Yes [provider]  losartan (COZAAR) 100 MG tablet Take 1 tablet (100 mg total) by mouth daily. 05/09/18  Yes Mikey College, NP  naproxen (NAPROSYN) 500 MG tablet Take 1 tablet (500 mg total) by mouth 2 (two) times daily as needed. 06/03/18  Yes Karamalegos, Devonne Doughty, DO  Pancrelipase, Lip-Prot-Amyl, (ZENPEP) 29937-16967 units CPEP Take 1 capsule by mouth 3 (three) times daily with meals. 05/09/18  Yes Mikey College, NP  sertraline (ZOLOFT) 100 MG tablet Take 100 mg by mouth daily.  03/11/16 03/30/19 Yes [provider]  traZODone (DESYREL) 150 MG tablet Take 1 tablet by mouth at bedtime as needed for sleep.  03/11/16  Yes [provider]  [employer]. Devices (ADJUST BATH/SHOWER SEAT/BACK) MISC 1 Device by Does not apply route daily. 05/09/18   Mikey College, NP     ALLERGIES:  Allergies  Allergen Reactions  . Hctz [Hydrochlorothiazide] Swelling     SOCIAL HISTORY:  Social History   Socioeconomic History  . Marital status: Legally Separated    Spouse name: Not on file  . Number of children: Not  on file  . Years of education: Not on file  . Highest education level: High school graduate  Occupational History  . Occupation: on disability  Social Needs  . Financial resource strain: Not on file  . Food insecurity:    Worry: Not on file    Inability: Not on file  . Transportation needs:    Medical: Not on file    Non-medical: Not on file  Tobacco Use  . Smoking status: Current Every Day Smoker    Packs/day: 0.25    Years: 15.00     Pack years: 3.75    Types: Cigarettes  . Smokeless tobacco: Never Used  Substance and Sexual Activity  . Alcohol use: Yes    Comment: about a six pack a week   . Drug use: Yes    Types: Marijuana  . Sexual activity: Yes    Birth control/protection: Condom  Lifestyle  . Physical activity:    Days per week: Not on file    Minutes per session: Not on file  . Stress: Not on file  Relationships  . Social connections:    Talks on phone: Not on file    Gets together: Not on file    Attends religious service: Not on file    Active member of club or organization: Not on file    Attends meetings of clubs or organizations: Not on file    Relationship status: Not on file  . Intimate partner violence:    Fear of current or ex partner: Not on file    Emotionally abused: Not on file    Physically abused: Not on file    Forced sexual activity: Not on file  Other Topics Concern  . Not on file  Social History Narrative  . Not on file    The patient currently resides (home / rehab facility / nursing home): Home The patient normally is (ambulatory / bedbound): Ambulatory   FAMILY HISTORY:  Family History  Problem Relation Age of Onset  . Diabetes Mother   . Sleep apnea Mother   . Emphysema Sister   . CAD Neg Hx      REVIEW OF SYSTEMS:  Constitutional: denies weight loss, fever, chills, or sweats  Eyes: denies any other vision changes, history of eye injury  ENT: denies sore throat, hearing problems  Respiratory: denies shortness of breath, wheezing  Cardiovascular: denies chest pain, palpitations  Gastrointestinal: + abdominal pain, + N/V, denied diarrhea/and bowel function as per HPI Genitourinary: denies burning with urination or urinary frequency Musculoskeletal: denies any other joint pains or cramps  Skin: denies any other rashes or skin discolorations  Neurological: denies any other headache, dizziness, weakness  Psychiatric: denies any other depression, anxiety   All  other review of systems were negative   VITAL SIGNS:  Temp:  [98.3 F (36.8 C)-100.3 F (37.9 C)] 100.3 F (37.9 C) (10/25 0834) Pulse Rate:  [88-104] 94 (10/25 0834) Resp:  [18-19] 18 (10/25 0834) BP: (111-146)/(70-91) 140/81 (10/25 0834) SpO2:  [92 %-100 %] 100 % (10/25 0834) Weight:  [80.6 kg-81.6 kg] 80.6 kg (10/24 2132)     Height: 5' 7.5" (171.5 cm) Weight: 80.6 kg BMI (Calculated): 27.4   INTAKE/OUTPUT:  This shift: Total I/O In: 240 [P.O.:240] Out: -   Last 2 shifts: @IOLAST2SHIFTS @   PHYSICAL EXAM:  Constitutional:  -- Normal body habitus  -- Awake, alert, and oriented x3, no apparent distress Eyes:  -- Pupils equally round and reactive to light  --  No scleral icterus, B/L no occular discharge Ear, nose, throat: -- Neck is FROM WNL Pulmonary:  -- No wheezes or rhales -- Equal breath sounds bilaterally -- Breathing non-labored at rest Cardiovascular:  -- S1, S2 present  -- No pericardial rubs  Gastrointestinal:  -- Abdomen soft, diffusely tender to light palpation, non-distended, no guarding or rebound tenderness -- No abdominal masses appreciated, pulsatile or otherwise  Musculoskeletal and Integumentary:  -- Wounds or skin discoloration: None Appreciated -- Extremities: B/L UE and LE FROM, hands and feet warm, no edema  Neurologic:  -- Motor function: Intact and symmetric -- Sensation: Intact and symmetric Psychiatric:  -- Mood and affect WNL    Labs:  CBC Latest Ref Rng & Units 06/10/2018 06/09/2018 05/07/2018  WBC 4.0 - 10.5 K/uL 10.7(H) 13.9(H) 9.1  Hemoglobin 13.0 - 17.0 g/dL 10.9(L) 13.0 11.8(L)  Hematocrit 39.0 - 52.0 % 33.3(L) 39.3 33.8(L)  Platelets 150 - 400 K/uL 245 331 209   CMP Latest Ref Rng & Units 06/10/2018 06/09/2018 05/08/2018  Glucose 70 - 99 mg/dL 97 128(H) 102(H)  BUN 6 - 20 mg/dL 17 17 6   Creatinine 0.61 - 1.24 mg/dL 1.37(H) 1.29(H) 0.97  Sodium 135 - 145 mmol/L 136 133(L) 135  Potassium 3.5 - 5.1 mmol/L 3.9 4.1 3.5  Chloride  98 - 111 mmol/L 106 104 103  CO2 22 - 32 mmol/L 23 23 23   Calcium 8.9 - 10.3 mg/dL 8.1(L) 8.9 8.6(L)  Total Protein 6.5 - 8.1 g/dL 7.0 8.2(H) -  Total Bilirubin 0.3 - 1.2 mg/dL 0.7 0.6 -  Alkaline Phos 38 - 126 U/L 67 86 -  AST 15 - 41 U/L 11(L) 17 -  ALT 0 - 44 U/L 9 11 -    Imaging studies:   -- CT Abdomen/Pelvis on 06/09/18:   IMPRESSION: 1. Acute pancreatitis superimposed on chronic calcific pancreatitis. 1.5 x 4.5 cm ill-defined acute fluid collection/developing pseudocyst along the anterior pancreatic body. No evidence of pancreatic necrosis or abscess. 2.  Aortic Atherosclerosis (ICD10-I70.0).   Assessment/Plan: (ICD-10's: K40.1) 52 y.o. male with acute on chronic pancreatitis with possible pancreatic pseudocyst vs inflammatory changes with associated mild leukocytosis, complicated by pertinent comorbidities including a history of chronic pancreatitis, HLD, HTN, previous alcohol abuse, and obesity .   - Okay for clears, if having pain then recommend NPO  - Start IVF hydration  - Pain control (may require IV narcotics)  - Continue to monitor clinical abdominal examination  - Clinical observation regarding potential pseudocyst vs inflammatory changes is recommended  - No indication for emergent surgical intervention regarding potential pseudocyst. If this continues to worsen, would need to consider outpatient referral -vs- transfer to tertiary center for surgical management  - Trend WBC  - Gastroenterology following, appreciate their input  - Medicine primary  All of the above findings and recommendations were discussed with the patient, and all of patient's questions were answered to his expressed satisfaction.  Thank you for the opportunity to participate in this patient's care.   -- Edison Simon, PA-C [facility] 06/10/2018, 1:30 PM [phone] M-F: 7am - 4pm

## 2018-06-10 NOTE — Consult Note (Signed)
Noah Velasquez , MD [address], [place], [place], [place], [address] [address] [place], [place], [place], [address] Phone: [phone]  Fax: [phone]  Consultation  Referring Provider:  Dr Margaretmary Eddy Primary Care Physician:  Noah College, NP Primary Gastroenterologist:    [facility]        Reason for Consultation:   Pancreatic cyst  Date of Admission:  06/09/2018 Date of Consultation:  06/10/2018         HPI:   Noah Velasquez is a 52 y.o. male has a history of alcoholic pancreatitis.  Looking back into his records back in 2014 I see a note from [facility] which mentioned complications of pancreatic duct leak with pancreatic ascites, pancreatic pseudocyst formation.  Cyst gastrostomy was performed at that time.  ERCP was performed.  He was last admitted in 05/06/2018 with acute pancreatitis secondary to alcohol.  Similar admission in 8/ 2019,03/2017 as well.  He presented to the emergency room last night with abdominal pain. He says he has chronic abdominal pain for many years, got worse the last few days all over his abdomen , no fevers, used cocaine 4 days back , says he quit all alcohol. Presently wants to drink soda, he smokes cigarettes.  On admission his lipase was mildly elevated at 72.  Transaminases and total bilirubin were within normal limits.  White cell count of 13.9.  Hemoglobin 13.  He underwent a CT scan of the abdomen yesterday and he demonstrated acute pancreatitis superimposed chronic calcific pancreatitis.  A 1.5 cm x 4.5 cm ill-defined fluid collection developing- pseudocyst along the anterior pancreatic body no evidence of pancreatic necrosis or abscess seen.   Month earlier in 05/06/2018 he underwent an MRI of his abdomen which demonstrated multiple calcifications within the pancreas and the main pancreatic duct consistent with chronic calcific pancreatitis.  Pancreatic duct is chronically dilated.  There was no mention of any pseudocyst formation at the point of  time.     Past Medical History:  Diagnosis Date  . Chronic hip pain   . Chronic pancreatitis ([place])   . Hyperlipidemia   . Hypertension   . Osteoarthritis   . Pancreatitis     Past Surgical History:  Procedure Laterality Date  . pancreatic stent placement      Prior to Admission medications   Medication Sig Start Date End Date Taking? Authorizing Provider  amLODipine (NORVASC) 10 MG tablet Take 1 tablet (10 mg total) by mouth daily. 05/09/18  Yes Noah College, NP  atorvastatin (LIPITOR) 40 MG tablet Take 40 mg by mouth daily.   Yes [provider]  buPROPion (WELLBUTRIN SR) 150 MG 12 hr tablet Take 1 tablet (150 mg total) by mouth daily. 05/08/18  Yes [name], MD  famotidine (PEPCID) 20 MG tablet Take 1 tablet (20 mg total) by mouth 2 (two) times daily. 05/18/18  Yes Noah College, NP  fluticasone [facility]) 50 MCG/ACT nasal spray Place 1 spray into both nostrils daily as needed for allergies or rhinitis.   Yes [provider]  gabapentin (NEURONTIN) 100 MG capsule Take 1 capsule (100 mg total) by mouth at bedtime as needed (sleep). 05/09/18  Yes Noah College, NP  loratadine (CLARITIN) 10 MG tablet Take 10 mg by mouth daily.   Yes [provider]  losartan (COZAAR) 100 MG tablet Take 1 tablet (100 mg total) by mouth daily. 05/09/18  Yes Noah College, NP  naproxen (NAPROSYN) 500 MG tablet Take 1 tablet (500  mg total) by mouth 2 (two) times daily as needed. 06/03/18  Yes Karamalegos, Devonne Doughty, DO  Pancrelipase, Lip-Prot-Amyl, (ZENPEP) 79024-09735 units CPEP Take 1 capsule by mouth 3 (three) times daily with meals. 05/09/18  Yes Noah College, NP  sertraline (ZOLOFT) 100 MG tablet Take 100 mg by mouth daily.  03/11/16 03/30/19 Yes [provider]  traZODone (DESYREL) 150 MG tablet Take 1 tablet by mouth at bedtime as needed for sleep.  03/11/16  Yes [provider]  [place]. Devices (ADJUST  BATH/SHOWER SEAT/BACK) MISC 1 Device by Does not apply route daily. 05/09/18   Noah College, NP    Family History  Problem Relation Age of Onset  . Diabetes Mother   . Sleep apnea Mother   . Emphysema Sister   . CAD Neg Hx      Social History   Tobacco Use  . Smoking status: Current Every Day Smoker    Packs/day: 0.25    Years: 15.00    Pack years: 3.75    Types: Cigarettes  . Smokeless tobacco: Never Used  Substance Use Topics  . Alcohol use: Yes    Comment: about a six pack a week   . Drug use: Yes    Types: Marijuana    Allergies as of 06/09/2018 - Review Complete 06/09/2018  Allergen Reaction Noted  . Hctz [hydrochlorothiazide] Swelling 04/29/2016    Review of Systems:    All systems reviewed and negative except where noted in HPI.   Physical Exam:  Vital signs in last 24 hours: Temp:  [98.3 F (36.8 C)-100.3 F (37.9 C)] 100.3 F (37.9 C) (10/25 0834) Pulse Rate:  [88-104] 94 (10/25 0834) Resp:  [18-19] 18 (10/25 0834) BP: (111-146)/(70-91) 140/81 (10/25 0834) SpO2:  [92 %-100 %] 100 % (10/25 0834) Weight:  [80.6 kg-81.6 kg] 80.6 kg (10/24 2132) Last BM Date: 06/09/18 General:   Pleasant, cooperative in NAD Head:  Normocephalic and atraumatic. Eyes:   No icterus.   Conjunctiva pink. PERRLA. Ears:  Normal auditory acuity. Neck:  Supple; no masses or thyroidomegaly Lungs: Respirations even and unlabored. Lungs clear to auscultation bilaterally.   No wheezes, crackles, or rhonchi.  Heart:  Regular rate and rhythm;  Without murmur, clicks, rubs or gallops Abdomen:  Soft, nondistended, mild generalized tenderness  Normal bowel sounds. No appreciable masses or hepatomegaly.  No rebound or guarding.  Neurologic:  Alert and oriented x3;  grossly normal neurologically. Skin:  Intact without significant lesions or rashes. Cervical Nodes:  No significant cervical adenopathy. Psych:  Alert and cooperative. Normal affect.  LAB RESULTS: Recent Labs     06/09/18 1532 06/10/18 0434  WBC 13.9* 10.7*  HGB 13.0 10.9*  HCT 39.3 33.3*  PLT 331 245   BMET Recent Labs    06/09/18 1532 06/10/18 0434  NA 133* 136  K 4.1 3.9  CL 104 106  CO2 23 23  GLUCOSE 128* 97  BUN 17 17  CREATININE 1.29* 1.37*  CALCIUM 8.9 8.1*   LFT Recent Labs    06/10/18 0434  PROT 7.0  ALBUMIN 3.1*  AST 11*  ALT 9  ALKPHOS 67  BILITOT 0.7   PT/INR No results for input(s): LABPROT, INR in the last 72 hours.  STUDIES: Ct Abdomen Pelvis W Contrast  Result Date: 06/09/2018 CLINICAL DATA:  52 year old male with acute abdominal and pelvic pain with nausea and vomiting today. History of pancreatitis. EXAM: CT ABDOMEN AND PELVIS WITH CONTRAST TECHNIQUE: Multidetector CT imaging of the abdomen  and pelvis was performed using the standard protocol following bolus administration of intravenous contrast. CONTRAST:  124mL ISOVUE-300 IOPAMIDOL (ISOVUE-300) INJECTION 61% COMPARISON:  03/29/2018 CT and 05/06/2018 MR FINDINGS: Lower chest: No acute abnormality. Mild bibasilar atelectasis/scarring again noted. Hepatobiliary: The liver and gallbladder are unremarkable. No CBD or intrahepatic biliary dilatation. Pancreas: Peripancreatic inflammation is identified. A 1.5 x 4.5 cm ill-defined area of fluid anterior to the pancreatic body compatible with acute fluid collection or developing pseudocyst. Chronic pancreatic calcifications are again noted as well as dilatation of the distal pancreatic duct. There is no evidence of pancreatic necrosis. Spleen: Unremarkable Adrenals/Urinary Tract: Scattered areas of renal scarring are again noted. No hydronephrosis or obstructing urinary calculi. The adrenal glands and bladder are unremarkable except for stable small LEFT adrenal adenomas. Stomach/Bowel: No evidence of bowel obstruction. Mild wall thickening of the lesser curvature of the stomach is likely reactive from adjacent pancreatitis. No other bowel wall thickening identified. The  appendix is normal. Vascular/Lymphatic: Aortic atherosclerotic calcifications noted without aneurysm. Enlarged bilateral inguinal lymph nodes are unchanged from 2018. Reproductive: Prostate is unremarkable. Other: No ascites, abscess or pneumoperitoneum. Musculoskeletal: No acute or suspicious bony abnormalities. 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 IMPRESSION: 1. Acute pancreatitis superimposed on chronic calcific pancreatitis. 1.5 x 4.5 cm ill-defined acute fluid collection/developing pseudocyst along the anterior pancreatic body. No evidence of pancreatic necrosis or abscess. 2.  Aortic Atherosclerosis (ICD10-I70.0). Electronically Signed   By: Margarette Canada M.D.   On: 06/09/2018 18:46      Impression / Plan:   Tin Engram is a 52 y.o. y/o male with a long-standing history of recurrent alcoholic pancreatitis.  He has radiological features consistent with chronic pancreatitis.  He has a chronically dilated pancreatic duct.  He has had a pancreatic pseudocyst drained in 2014 with a gastrostomy.  He was admitted a month back with acute pancreatitis but there was no pseudocyst seen on an MRI at that point of time.  He was admitted yesterday with abdominal pain and was noted to have features suggestive of early development of a pseudocyst.  There is no evidence of sepsis no fever suggesting that there is no clear evidence that the cyst is infected at this point of time.  Plan 1.  The mainstay of management a developing pseudocyst and pancreatitis consists of watchful waiting.  Very often the pseudocyst resolves on its own.  Repeat imaging should be performed in 4 to 6 weeks to confirm resolution of the pseudocyst.  If there is no resolution and there is development of a walled off pancreatic psudocyst then the next step would be to obtain a drainage of the cyst through his cyst-gastrostomy which would be done endoscopically.  2.  In the meanwhile strongly suggest him to abstain  from drinking alcohol,cocaine and cigarette smoking ,  IV fluids, analgesia.  He will be ready to go home when he is tolerating a regular diet and has no pain.  3.  He should follow-up at a tertiary center in the future for evaluation of the pseudocyst and subsequent management.   Thank you for involving me in the care of this patient.      LOS: 1 day   Noah Bellows, MD  06/10/2018, 12:44 PM

## 2018-06-10 NOTE — Progress Notes (Signed)
[place] [facility] [place] NAME: Noah Velasquez    MR#:  [id number]  DATE OF BIRTH:  1966-02-06  SUBJECTIVE:  CHIEF COMPLAINT:  Pt is reporting epigastric abdominal pain but denies any nausea vomiting.  Tolerating clear liquids.  Denies any back pain still continues to drink beer twice a week and continues to smoke.  REVIEW OF SYSTEMS:  CONSTITUTIONAL: No fever, fatigue or weakness.  EYES: No blurred or double vision.  EARS, NOSE, AND THROAT: No tinnitus or ear pain.  RESPIRATORY: No cough, shortness of breath, wheezing or hemoptysis.  CARDIOVASCULAR: No chest pain, orthopnea, edema.  GASTROINTESTINAL: No nausea, vomiting, diarrhea, reporting epigastric abdominal pain.  GENITOURINARY: No dysuria, hematuria.  ENDOCRINE: No polyuria, nocturia,  HEMATOLOGY: No anemia, easy bruising or bleeding SKIN: No rash or lesion. MUSCULOSKELETAL: No joint pain or arthritis.   NEUROLOGIC: No tingling, numbness, weakness.  PSYCHIATRY: No anxiety or depression.   DRUG ALLERGIES:   Allergies  Allergen Reactions  . Hctz [Hydrochlorothiazide] Swelling    VITALS:  Blood pressure 140/81, pulse 94, temperature 100.3 F (37.9 C), temperature source Oral, resp. rate 18, height 5' 7.5" (1.715 m), weight 80.6 kg, SpO2 100 %.  PHYSICAL EXAMINATION:  GENERAL:  52 y.o.-year-old patient lying in the bed with no acute distress.  EYES: Pupils equal, round, reactive to light and accommodation. No scleral icterus. Extraocular muscles intact.  HEENT: Head atraumatic, normocephalic. Oropharynx and nasopharynx clear.  NECK:  Supple, no jugular venous distention. No thyroid enlargement, no tenderness.  LUNGS: Normal breath sounds bilaterally, no wheezing, rales,rhonchi or crepitation. No use of accessory muscles of respiration.  CARDIOVASCULAR: S1, S2 normal. No murmurs, rubs, or gallops.  ABDOMEN: Soft, epigastric tenderness is present no rebound tenderness  nondistended. Bowel sounds present.  EXTREMITIES: No pedal edema, cyanosis, or clubbing.  NEUROLOGIC: Cranial nerves II through XII are intact. Muscle strength 5/5 in all extremities. Sensation intact. Gait not checked.  PSYCHIATRIC: The patient is alert and oriented x 3.  SKIN: No obvious rash, lesion, or ulcer.    LABORATORY PANEL:   CBC Recent Labs  Lab 06/10/18 0434  WBC 10.7*  HGB 10.9*  HCT 33.3*  PLT 245   ------------------------------------------------------------------------------------------------------------------  Chemistries  Recent Labs  Lab 06/10/18 0434  NA 136  K 3.9  CL 106  CO2 23  GLUCOSE 97  BUN 17  CREATININE 1.37*  CALCIUM 8.1*  AST 11*  ALT 9  ALKPHOS 67  BILITOT 0.7   ------------------------------------------------------------------------------------------------------------------  Cardiac Enzymes No results for input(s): TROPONINI in the last 168 hours. ------------------------------------------------------------------------------------------------------------------  RADIOLOGY:  Ct Abdomen Pelvis W Contrast  Result Date: 06/09/2018 CLINICAL DATA:  52 year old male with acute abdominal and pelvic pain with nausea and vomiting today. History of pancreatitis. EXAM: CT ABDOMEN AND PELVIS WITH CONTRAST TECHNIQUE: Multidetector CT imaging of the abdomen and pelvis was performed using the standard protocol following bolus administration of intravenous contrast. CONTRAST:  121mL ISOVUE-300 IOPAMIDOL (ISOVUE-300) INJECTION 61% COMPARISON:  03/29/2018 CT and 05/06/2018 MR FINDINGS: Lower chest: No acute abnormality. Mild bibasilar atelectasis/scarring again noted. Hepatobiliary: The liver and gallbladder are unremarkable. No CBD or intrahepatic biliary dilatation. Pancreas: Peripancreatic inflammation is identified. A 1.5 x 4.5 cm ill-defined area of fluid anterior to the pancreatic body compatible with acute fluid collection or developing pseudocyst.  Chronic pancreatic calcifications are again noted as well as dilatation of the distal pancreatic duct. There is no evidence of pancreatic necrosis. Spleen: Unremarkable Adrenals/Urinary Tract: Scattered areas of renal  scarring are again noted. No hydronephrosis or obstructing urinary calculi. The adrenal glands and bladder are unremarkable except for stable small LEFT adrenal adenomas. Stomach/Bowel: No evidence of bowel obstruction. Mild wall thickening of the lesser curvature of the stomach is likely reactive from adjacent pancreatitis. No other bowel wall thickening identified. The appendix is normal. Vascular/Lymphatic: Aortic atherosclerotic calcifications noted without aneurysm. Enlarged bilateral inguinal lymph nodes are unchanged from 2018. Reproductive: Prostate is unremarkable. Other: No ascites, abscess or pneumoperitoneum. Musculoskeletal: No acute or suspicious bony abnormalities. 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 IMPRESSION: 1. Acute pancreatitis superimposed on chronic calcific pancreatitis. 1.5 x 4.5 cm ill-defined acute fluid collection/developing pseudocyst along the anterior pancreatic body. No evidence of pancreatic necrosis or abscess. 2.  Aortic Atherosclerosis (ICD10-I70.0). Electronically Signed   By: Margarette Canada M.D.   On: 06/09/2018 18:46    EKG:   Orders placed or performed during the hospital encounter of 05/06/18  . ED EKG  . ED EKG  . EKG 12-Lead  . EKG 12-Lead    ASSESSMENT AND PLAN:    52 year old male with past medical history of alcohol abuse, chronic pancreatitis, osteoarthritis, hypertension, hyperlipidemia who presents to the hospital due to abdominal pain nausea and vomiting.  1.  Acute on chronic pancreatitis- this is a cause of patient's worsening abdominal pain nausea vomiting.  Patient although denies any recent alcohol abuse. 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  Follow LFTs and lipase in a.m. -Patient can be made n.p.o. if pain gets worse with clear liquids   2.  Essential hypertension-continue Norvasc, losartan.  3.  Hyperlipidemia-continue atorvastatin.  4.  Depression-continue Wellbutrin.  5.  History of chronic pancreatitis-continue pancreatic lipase supplements.  6.  Neuropathy-continue gabapentin.  7.  Tobacco abuse disorder counseled patient to quit smoking for 5 minutes.  He verbalized understanding and agreeable with the nicotine patch.  Counseled patient to stop drinking alcohol.  Last drink was 1 month ago as reported by the patient  All the records are reviewed and case discussed with Care Management/Social Workerr. Management plans discussed with the patient, family and they are in agreement.  CODE STATUS: fc  TOTAL TIME TAKING CARE OF THIS PATIENT: 36  minutes.   POSSIBLE D/C IN 2  DAYS, DEPENDING ON CLINICAL CONDITION.  Note: This dictation was prepared with Dragon dictation along with smaller phrase technology. Any transcriptional errors that result from this process are unintentional.   Nicholes Mango M.D on 06/10/2018 at 4:27 PM  Between 7am to 6pm - Pager - [phone] After 6pm go to [email or web address] - password [email or web address] [facility]  Office  [phone]  CC: Primary care physician; Mikey College, NP

## 2018-06-11 LAB — COMPREHENSIVE METABOLIC PANEL
ALT: 9 U/L (ref 0–44)
ANION GAP: 8 (ref 5–15)
AST: 13 U/L — ABNORMAL LOW (ref 15–41)
Albumin: 3.3 g/dL — ABNORMAL LOW (ref 3.5–5.0)
Alkaline Phosphatase: 65 U/L (ref 38–126)
BUN: 11 mg/dL (ref 6–20)
CHLORIDE: 102 mmol/L (ref 98–111)
CO2: 23 mmol/L (ref 22–32)
Calcium: 8.5 mg/dL — ABNORMAL LOW (ref 8.9–10.3)
Creatinine, Ser: 1.11 mg/dL (ref 0.61–1.24)
GFR calc non Af Amer: >60 mL/min (ref >60)
Glucose, Bld: 92 mg/dL (ref 70–99)
Potassium: 4.1 mmol/L (ref 3.5–5.1)
SODIUM: 133 mmol/L — AB (ref 135–145)
Total Bilirubin: 0.8 mg/dL (ref 0.3–1.2)
Total Protein: 7.5 g/dL (ref 6.5–8.1)

## 2018-06-11 LAB — LIPASE, BLOOD: Lipase: 34 U/L (ref 11–51)

## 2018-06-11 MED ORDER — PANCRELIPASE (LIP-PROT-AMYL) 12000-38000 UNITS PO CPEP
72000.0000 [IU] | ORAL_CAPSULE | Freq: Three times a day (TID) | ORAL | Status: DC
  Filled 2018-06-11 (×4): qty 6

## 2018-06-11 MED ORDER — HYDROCODONE-ACETAMINOPHEN 5-325 MG PO TABS
1.0000 | ORAL_TABLET | Freq: Four times a day (QID) | ORAL | Status: DC | PRN
  Administered 2018-06-11 – 2018-06-12 (×4): 1 via ORAL
  Filled 2018-06-11: qty 1
  Filled 2018-06-11: qty 2
  Filled 2018-06-11 (×3): qty 1

## 2018-06-11 MED ORDER — MORPHINE SULFATE (PF) 2 MG/ML IV SOLN
2.0000 mg | INTRAVENOUS | Status: DC | PRN
  Filled 2018-06-11: qty 1

## 2018-06-11 NOTE — Progress Notes (Signed)
[place] at [place] NAME: Noah Velasquez    MR#:  [id number]  DATE OF BIRTH:  1965-11-18  SUBJECTIVE:  CHIEF COMPLAINT:   Chief Complaint  Patient presents with  . Abdominal Pain  Feeling better, less pain  REVIEW OF SYSTEMS:  CONSTITUTIONAL: No fever, fatigue or weakness.  EYES: No blurred or double vision.  EARS, NOSE, AND THROAT: No tinnitus or ear pain.  RESPIRATORY: No cough, shortness of breath, wheezing or hemoptysis.  CARDIOVASCULAR: No chest pain, orthopnea, edema.  GASTROINTESTINAL: No nausea, vomiting, diarrhea or abdominal pain.  GENITOURINARY: No dysuria, hematuria.  ENDOCRINE: No polyuria, nocturia,  HEMATOLOGY: No anemia, easy bruising or bleeding SKIN: No rash or lesion. MUSCULOSKELETAL: No joint pain or arthritis.   NEUROLOGIC: No tingling, numbness, weakness.  PSYCHIATRY: No anxiety or depression.   ROS  DRUG ALLERGIES:   Allergies  Allergen Reactions  . Hctz [Hydrochlorothiazide] Swelling    VITALS:  Blood pressure 120/74, pulse 97, temperature 98.7 F (37.1 C), temperature source Oral, resp. rate 20, height 5' 7.5" (1.715 m), weight 80.6 kg, SpO2 95 %.  PHYSICAL EXAMINATION:  GENERAL:  52 y.o.-year-old patient lying in the bed with no acute distress.  EYES: Pupils equal, round, reactive to light and accommodation. No scleral icterus. Extraocular muscles intact.  HEENT: Head atraumatic, normocephalic. Oropharynx and nasopharynx clear.  NECK:  Supple, no jugular venous distention. No thyroid enlargement, no tenderness.  LUNGS: Normal breath sounds bilaterally, no wheezing, rales,rhonchi or crepitation. No use of accessory muscles of respiration.  CARDIOVASCULAR: S1, S2 normal. No murmurs, rubs, or gallops.  ABDOMEN: Soft, nontender, nondistended. Bowel sounds present. No organomegaly or mass.  EXTREMITIES: No pedal edema, cyanosis, or clubbing.  NEUROLOGIC: Cranial nerves II through XII are intact. Muscle  strength 5/5 in all extremities. Sensation intact. Gait not checked.  PSYCHIATRIC: The patient is alert and oriented x 3.  SKIN: No obvious rash, lesion, or ulcer.   Physical Exam LABORATORY PANEL:   CBC Recent Labs  Lab 06/10/18 0434  WBC 10.7*  HGB 10.9*  HCT 33.3*  PLT 245   ------------------------------------------------------------------------------------------------------------------  Chemistries  Recent Labs  Lab 06/11/18 0441  NA 133*  K 4.1  CL 102  CO2 23  GLUCOSE 92  BUN 11  CREATININE 1.11  CALCIUM 8.5*  AST 13*  ALT 9  ALKPHOS 65  BILITOT 0.8   ------------------------------------------------------------------------------------------------------------------  Cardiac Enzymes No results for input(s): TROPONINI in the last 168 hours. ------------------------------------------------------------------------------------------------------------------  RADIOLOGY:  Ct Abdomen Pelvis W Contrast  Result Date: 06/09/2018 CLINICAL DATA:  52 year old male with acute abdominal and pelvic pain with nausea and vomiting today. History of pancreatitis. EXAM: CT ABDOMEN AND PELVIS WITH CONTRAST TECHNIQUE: Multidetector CT imaging of the abdomen and pelvis was performed using the standard protocol following bolus administration of intravenous contrast. CONTRAST:  126mL ISOVUE-300 IOPAMIDOL (ISOVUE-300) INJECTION 61% COMPARISON:  03/29/2018 CT and 05/06/2018 MR FINDINGS: Lower chest: No acute abnormality. Mild bibasilar atelectasis/scarring again noted. Hepatobiliary: The liver and gallbladder are unremarkable. No CBD or intrahepatic biliary dilatation. Pancreas: Peripancreatic inflammation is identified. A 1.5 x 4.5 cm ill-defined area of fluid anterior to the pancreatic body compatible with acute fluid collection or developing pseudocyst. Chronic pancreatic calcifications are again noted as well as dilatation of the distal pancreatic duct. There is no evidence of pancreatic  necrosis. Spleen: Unremarkable Adrenals/Urinary Tract: Scattered areas of renal scarring are again noted. No hydronephrosis or obstructing urinary calculi. The adrenal glands and bladder are unremarkable  except for stable small LEFT adrenal adenomas. Stomach/Bowel: No evidence of bowel obstruction. Mild wall thickening of the lesser curvature of the stomach is likely reactive from adjacent pancreatitis. No other bowel wall thickening identified. The appendix is normal. Vascular/Lymphatic: Aortic atherosclerotic calcifications noted without aneurysm. Enlarged bilateral inguinal lymph nodes are unchanged from 2018. Reproductive: Prostate is unremarkable. Other: No ascites, abscess or pneumoperitoneum. Musculoskeletal: No acute or suspicious bony abnormalities. 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 IMPRESSION: 1. Acute pancreatitis superimposed on chronic calcific pancreatitis. 1.5 x 4.5 cm ill-defined acute fluid collection/developing pseudocyst along the anterior pancreatic body. No evidence of pancreatic necrosis or abscess. 2.  Aortic Atherosclerosis (ICD10-I70.0). Electronically Signed   By: Margarette Canada M.D.   On: 06/09/2018 18:46    ASSESSMENT AND PLAN:  52 year old male with past medical history of alcohol abuse, chronic pancreatitis, osteoarthritis, hypertension, hyperlipidemia who presents to the hospital due to abdominal pain nausea and vomiting.  1.  Acute on chronic pancreatitis Solving Gastroenterology and general surgery input appreciated Continue adult pain protocol, continue clear liquid diet for now, taper opioids as tolerated, Creon 3 times daily with meals  2.  Essential hypertension-continue Norvasc, losartan.  3.  Hyperlipidemia-continue atorvastatin.  4.  Depression-continue Wellbutrin.  5.  History of chronic pancreatitis-continue pancreatic lipase supplements.  6.  Neuropathy-continue gabapentin.  Disposition Home in 1 to 2 days barring any  complications   All the records are reviewed and case discussed with Care Management/Social Workerr. Management plans discussed with the patient, family and they are in agreement.  CODE STATUS: full  TOTAL TIME TAKING CARE OF THIS PATIENT: 35 minutes.     POSSIBLE D/C IN 1-3 DAYS, DEPENDING ON CLINICAL CONDITION.   Avel Peace Noah Velasquez M.D on 06/11/2018   Between 7am to 6pm - Pager - [phone]  After 6pm go to [email or web address] - password [email or web address] [facility]  Office  [phone]  CC: Primary care physician; Mikey College, NP  Note: This dictation was prepared with Dragon dictation along with smaller phrase technology. Any transcriptional errors that result from this process are unintentional.

## 2018-06-11 NOTE — Plan of Care (Signed)

## 2018-06-11 NOTE — Progress Notes (Signed)
Noah Darby, MD [address]  [place]  [place], [place] [address]  Main: [phone]  Fax: [phone] Pager: [phone]   Subjective: Tolerating clear liquid diet.  Pain is somewhat improved today, denies nausea.   Objective: Vital signs in last 24 hours: Vitals:   06/10/18 0505 06/10/18 0834 06/10/18 2322 06/11/18 0818  BP: 118/83 140/81 (!) 146/86 120/74  Pulse: 97 94 (!) 106 97  Resp: 18 18 20    Temp: 98.6 F (37 C) 100.3 F (37.9 C) 98.6 F (37 C) 98.7 F (37.1 C)  TempSrc: Oral Oral Oral Oral  SpO2: 92% 100% 97% 95%  Weight:      Height:       Weight change:   Intake/Output Summary (Last 24 hours) at 06/11/2018 1404 Last data filed at 06/11/2018 0300 Gross per 24 hour  Intake 400 ml  Output -  Net 400 ml     Exam: Heart:: Regular rate and rhythm or S1S2 present Lungs: clear to auscultation Abdomen: Mild to moderate tenderness in the epigastric region, no guarding or rigidity   Lab Results: CBC Latest Ref Rng & Units 06/10/2018 06/09/2018 05/07/2018  WBC 4.0 - 10.5 K/uL 10.7(H) 13.9(H) 9.1  Hemoglobin 13.0 - 17.0 g/dL 10.9(L) 13.0 11.8(L)  Hematocrit 39.0 - 52.0 % 33.3(L) 39.3 33.8(L)  Platelets 150 - 400 K/uL 245 331 209   CMP Latest Ref Rng & Units 06/11/2018 06/10/2018 06/09/2018  Glucose 70 - 99 mg/dL 92 97 128(H)  BUN 6 - 20 mg/dL 11 17 17   Creatinine 0.61 - 1.24 mg/dL 1.11 1.37(H) 1.29(H)  Sodium 135 - 145 mmol/L 133(L) 136 133(L)  Potassium 3.5 - 5.1 mmol/L 4.1 3.9 4.1  Chloride 98 - 111 mmol/L 102 106 104  CO2 22 - 32 mmol/L 23 23 23   Calcium 8.9 - 10.3 mg/dL 8.5(L) 8.1(L) 8.9  Total Protein 6.5 - 8.1 g/dL 7.5 7.0 8.2(H)  Total Bilirubin 0.3 - 1.2 mg/dL 0.8 0.7 0.6  Alkaline Phos 38 - 126 U/L 65 67 86  AST 15 - 41 U/L 13(L) 11(L) 17  ALT 0 - 44 U/L 9 9 11     Micro Results: No results found for this or any previous visit (from the past 240 hour(s)). Studies/Results: Ct Abdomen Pelvis W Contrast  Result Date:  06/09/2018 CLINICAL DATA:  52 year old male with acute abdominal and pelvic pain with nausea and vomiting today. History of pancreatitis. EXAM: CT ABDOMEN AND PELVIS WITH CONTRAST TECHNIQUE: Multidetector CT imaging of the abdomen and pelvis was performed using the standard protocol following bolus administration of intravenous contrast. CONTRAST:  159mL ISOVUE-300 IOPAMIDOL (ISOVUE-300) INJECTION 61% COMPARISON:  03/29/2018 CT and 05/06/2018 MR FINDINGS: Lower chest: No acute abnormality. Mild bibasilar atelectasis/scarring again noted. Hepatobiliary: The liver and gallbladder are unremarkable. No CBD or intrahepatic biliary dilatation. Pancreas: Peripancreatic inflammation is identified. A 1.5 x 4.5 cm ill-defined area of fluid anterior to the pancreatic body compatible with acute fluid collection or developing pseudocyst. Chronic pancreatic calcifications are again noted as well as dilatation of the distal pancreatic duct. There is no evidence of pancreatic necrosis. Spleen: Unremarkable Adrenals/Urinary Tract: Scattered areas of renal scarring are again noted. No hydronephrosis or obstructing urinary calculi. The adrenal glands and bladder are unremarkable except for stable small LEFT adrenal adenomas. Stomach/Bowel: No evidence of bowel obstruction. Mild wall thickening of the lesser curvature of the stomach is likely reactive from adjacent pancreatitis. No other bowel wall thickening identified. The appendix is normal. Vascular/Lymphatic: Aortic atherosclerotic calcifications  noted without aneurysm. Enlarged bilateral inguinal lymph nodes are unchanged from 2018. Reproductive: Prostate is unremarkable. Other: No ascites, abscess or pneumoperitoneum. Musculoskeletal: No acute or suspicious bony abnormalities. 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 IMPRESSION: 1. Acute pancreatitis superimposed on chronic calcific pancreatitis. 1.5 x 4.5 cm ill-defined acute fluid collection/developing  pseudocyst along the anterior pancreatic body. No evidence of pancreatic necrosis or abscess. 2.  Aortic Atherosclerosis (ICD10-I70.0). Electronically Signed   By: Margarette Canada M.D.   On: 06/09/2018 18:46   Medications: I have reviewed the patient's current medications. Scheduled Meds: . amLODipine  10 mg Oral Daily  . atorvastatin  40 mg Oral Daily  . buPROPion  150 mg Oral Daily  . enoxaparin (LOVENOX) injection  40 mg Subcutaneous Q24H  . famotidine  20 mg Oral BID  . lipase/protease/amylase  72,000 Units Oral TID WC  . loratadine  10 mg Oral Daily  . losartan  100 mg Oral Daily  . nicotine  14 mg Transdermal Daily  . sertraline  100 mg Oral Daily   Continuous Infusions: PRN Meds:.fluticasone, gabapentin, HYDROcodone-acetaminophen, morphine injection, ondansetron **OR** ondansetron (ZOFRAN) IV, promethazine, traMADol, traZODone   Assessment: Active Problems:   Acute on chronic pancreatitis ([place]) Prior history of alcohol use, severe acute pancreatitis, pseudocyst with drainage in 2014. Patient was to follow-up with Dr. Stephanie Acre at [facility], pancreas clinic in the past Developing pseudocyst of the pancreas, no evidence of infection or necrosis  Plan: Continue clear liquid diet today, will try to advance to full liquids tomorrow Pain control Increase Creon to 72K with each meal He is on Zenpep as outpatient, adjusted him to take 2 capsules with each meal and 1 capsule with snack Advised him to quit smoking Check serum triglycerides   LOS: 2 days   Noah Velasquez 06/11/2018, 2:04 PM

## 2018-06-12 LAB — LIPID PANEL
CHOL/HDL RATIO: 3.8 ratio
Cholesterol: 114 mg/dL (ref 0–200)
HDL: 30 mg/dL — ABNORMAL LOW (ref >40)
LDL CALC: 66 mg/dL (ref 0–99)
TRIGLYCERIDES: 89 mg/dL (ref <150)
VLDL: 18 mg/dL (ref 0–40)

## 2018-06-12 LAB — TRIGLYCERIDES: TRIGLYCERIDES: 79 mg/dL (ref <150)

## 2018-06-12 MED ORDER — PANCRELIPASE (LIP-PROT-AMYL) 36000-114000 UNITS PO CPEP: 72000.0000 [IU] | ORAL_CAPSULE | Freq: Three times a day (TID) | ORAL | 1 refills | Status: DC

## 2018-06-12 MED ORDER — NICOTINE 14 MG/24HR TD PT24: 14.0000 mg | MEDICATED_PATCH | Freq: Every day | TRANSDERMAL | 0 refills | Status: DC

## 2018-06-12 NOTE — Discharge Summary (Signed)
[facility] NAME: Noah Velasquez    MR#:  [id number]  DATE OF BIRTH:  [birth year]  DATE OF ADMISSION:  06/09/2018 ADMITTING PHYSICIAN: Henreitta Leber, MD  DATE OF DISCHARGE: No discharge date for patient encounter.  PRIMARY CARE PHYSICIAN: Mikey College, NP    ADMISSION DIAGNOSIS:  Acute abdominal pain [R10.9] Alcohol-induced acute pancreatitis, unspecified complication status [M22.63]  DISCHARGE DIAGNOSIS:  Active Problems:   Acute on chronic pancreatitis ([place])   SECONDARY DIAGNOSIS:   Past Medical History:  Diagnosis Date  . Chronic hip pain   . Chronic pancreatitis ([place])   . Hyperlipidemia   . Hypertension   . Osteoarthritis   . Pancreatitis     HOSPITAL COURSE:   52 year old male with past medical history of alcohol abuse, chronic pancreatitis, osteoarthritis, hypertension, hyperlipidemia who presents to the hospital due to abdominal pain nausea and vomiting.  1. Acute on chronic pancreatitis Resolved Gastroenterology and general surgery did see patient while in house  Treated on our adult pain protocol, tolerated diet, Creon 3 times daily with meals, and patient did well  2. Essential hypertension-continue Norvasc, losartan.  3. Hyperlipidemia-continue atorvastatin.  4. Depression-continue Wellbutrin.  5. History of chronic pancreatitis-continue pancreatic lipase supplements.  6. Neuropathy-continue gabapentin.  DISCHARGE CONDITIONS:   stable  CONSULTS OBTAINED:  Treatment Team:  Jonathon Bellows, MD  DRUG ALLERGIES:   Allergies  Allergen Reactions  . Hctz [Hydrochlorothiazide] Swelling    DISCHARGE MEDICATIONS:   Allergies as of 06/12/2018      Reactions   Hctz [hydrochlorothiazide] Swelling      Medication List    TAKE these medications   Adjust Bath/Shower Seat/Back Misc 1 Device by Does not apply route daily.   amLODipine 10 MG tablet Commonly known as:   NORVASC Take 1 tablet (10 mg total) by mouth daily.   atorvastatin 40 MG tablet Commonly known as:  LIPITOR Take 40 mg by mouth daily.   buPROPion 150 MG 12 hr tablet Commonly known as:  WELLBUTRIN SR Take 1 tablet (150 mg total) by mouth daily.   famotidine 20 MG tablet Commonly known as:  PEPCID Take 1 tablet (20 mg total) by mouth 2 (two) times daily.   fluticasone 50 MCG/ACT nasal spray Commonly known as:  FLONASE Place 1 spray into both nostrils daily as needed for allergies or rhinitis.   gabapentin 100 MG capsule Commonly known as:  NEURONTIN Take 1 capsule (100 mg total) by mouth at bedtime as needed (sleep).   lipase/protease/amylase 36000 UNITS Cpep capsule Commonly known as:  CREON Take 2 capsules (72,000 Units total) by mouth 3 (three) times daily with meals. What changed:    medication strength  how much to take   loratadine 10 MG tablet Commonly known as:  CLARITIN Take 10 mg by mouth daily.   losartan 100 MG tablet Commonly known as:  COZAAR Take 1 tablet (100 mg total) by mouth daily.   naproxen 500 MG tablet Commonly known as:  NAPROSYN Take 1 tablet (500 mg total) by mouth 2 (two) times daily as needed.   nicotine 14 mg/24hr patch Commonly known as:  NICODERM CQ - dosed in mg/24 hours Place 1 patch (14 mg total) onto the skin daily. Start taking on:  06/13/2018   sertraline 100 MG tablet Commonly known as:  ZOLOFT Take 100 mg by mouth daily.   traZODone 150 MG tablet Commonly known as:  DESYREL Take 1 tablet by mouth  at bedtime as needed for sleep.        DISCHARGE INSTRUCTIONS:   If you experience worsening of your admission symptoms, develop shortness of breath, life threatening emergency, suicidal or homicidal thoughts you must seek medical attention immediately by calling 911 or calling your MD immediately  if symptoms less severe.  You Must read complete instructions/literature along with all the possible adverse reactions/side  effects for all the Medicines you take and that have been prescribed to you. Take any new Medicines after you have completely understood and accept all the possible adverse reactions/side effects.   Please note  You were cared for by a hospitalist during your hospital stay. If you have any questions about your discharge medications or the care you received while you were in the hospital after you are discharged, you can call the unit and asked to speak with the hospitalist on call if the hospitalist that took care of you is not available. Once you are discharged, your primary care physician will handle any further medical issues. Please note that NO REFILLS for any discharge medications will be authorized once you are discharged, as it is imperative that you return to your primary care physician (or establish a relationship with a primary care physician if you do not have one) for your aftercare needs so that they can reassess your need for medications and monitor your lab values.    Today   CHIEF COMPLAINT:   Chief Complaint  Patient presents with  . Abdominal Pain    HISTORY OF PRESENT ILLNESS:  52 y.o. male with a known history of chronic pancreatitis, alcohol abuse, hypertension, hyperlipidemia, osteoarthritis who presents to the hospital due to abdominal pain nausea and vomiting.  Patient says he developed worsening abdominal pain nausea vomiting that began yesterday and has progressively gotten worse.  He had multiple episodes of vomiting today which was bilious in nature.  He was able to eat a slice of bread and also drink some water but has not been able to keep anything else down.  His pain is in the middle of his abdomen nonradiating and not associate with any diarrhea, melena, hematochezia or any other associated symptoms.  Patient presents to the hospital underwent a CT scan of his abdomen pelvis which is suggestive of acute on chronic pancreatitis with a pancreatic pseudocyst.   Hospitalist services were contacted for admission. VITAL SIGNS:  Blood pressure 116/82, pulse 86, temperature 98 F (36.7 C), temperature source Oral, resp. rate 18, height 5' 7.5" (1.715 m), weight 80.6 kg, SpO2 98 %.  I/O:    Intake/Output Summary (Last 24 hours) at 06/12/2018 1019 Last data filed at 06/11/2018 1858 Gross per 24 hour  Intake 70 ml  Output -  Net 70 ml    PHYSICAL EXAMINATION:  GENERAL:  52 y.o.-year-old patient lying in the bed with no acute distress.  EYES: Pupils equal, round, reactive to light and accommodation. No scleral icterus. Extraocular muscles intact.  HEENT: Head atraumatic, normocephalic. Oropharynx and nasopharynx clear.  NECK:  Supple, no jugular venous distention. No thyroid enlargement, no tenderness.  LUNGS: Normal breath sounds bilaterally, no wheezing, rales,rhonchi or crepitation. No use of accessory muscles of respiration.  CARDIOVASCULAR: S1, S2 normal. No murmurs, rubs, or gallops.  ABDOMEN: Soft, non-tender, non-distended. Bowel sounds present. No organomegaly or mass.  EXTREMITIES: No pedal edema, cyanosis, or clubbing.  NEUROLOGIC: Cranial nerves II through XII are intact. Muscle strength 5/5 in all extremities. Sensation intact. Gait not checked.  PSYCHIATRIC:  The patient is alert and oriented x 3.  SKIN: No obvious rash, lesion, or ulcer.   DATA REVIEW:   CBC Recent Labs  Lab 06/10/18 0434  WBC 10.7*  HGB 10.9*  HCT 33.3*  PLT 245    Chemistries  Recent Labs  Lab 06/11/18 0441  NA 133*  K 4.1  CL 102  CO2 23  GLUCOSE 92  BUN 11  CREATININE 1.11  CALCIUM 8.5*  AST 13*  ALT 9  ALKPHOS 65  BILITOT 0.8    Cardiac Enzymes No results for input(s): TROPONINI in the last 168 hours.  Microbiology Results  Results for orders placed or performed during the hospital encounter of 03/29/18  CULTURE, BLOOD (ROUTINE X 2) w Reflex to ID Panel     Status: None   Collection Time: 03/31/18  4:51 AM  Result Value Ref Range  Status   Specimen Description BLOOD RIGHT ASSIST CONTROL  Final   Special Requests   Final    BOTTLES DRAWN AEROBIC AND ANAEROBIC Blood Culture adequate volume   Culture   Final    NO GROWTH 5 DAYS Performed at [facility], [address] [place], [place] [address]    Report Status 04/05/2018 FINAL  Final  CULTURE, BLOOD (ROUTINE X 2) w Reflex to ID Panel     Status: None   Collection Time: 03/31/18  5:02 AM  Result Value Ref Range Status   Specimen Description BLOOD RIGHT HAND  Final   Special Requests   Final    BOTTLES DRAWN AEROBIC AND ANAEROBIC Blood Culture adequate volume   Culture   Final    NO GROWTH 5 DAYS Performed at [facility], [address] [place], [place] [address]    Report Status 04/05/2018 FINAL  Final  Expectorated sputum assessment w rflx to resp cult     Status: None   Collection Time: 03/31/18 12:07 PM  Result Value Ref Range Status   Specimen Description SPUTUM  Final   Special Requests NONE  Final   Sputum evaluation   Final    Sputum specimen not acceptable for testing.  Please recollect.   Performed at [facility], [address] [place],  [address]    Report Status 03/31/2018 FINAL  Final    RADIOLOGY:  No results found.  EKG:   Orders placed or performed during the hospital encounter of 05/06/18  . ED EKG  . ED EKG  . EKG 12-Lead  . EKG 12-Lead      Management plans discussed with the patient, family and they are in agreement.  CODE STATUS:     Code Status Orders  (From admission, onward)         Start     Ordered   06/09/18 2057  Full code  Continuous     06/09/18 2056        Code Status History    Date Active Date Inactive Code Status Order ID Comments User Context   05/06/2018 1330 05/08/2018 1540 Full Code [id number]  Epifanio Lesches, MD ED   03/29/2018 1109 04/04/2018 1616 Full Code [id number]  Dustin Flock, MD Inpatient   04/03/2017 0314 04/04/2017 1741 Full Code [id number]   Saundra Shelling, MD Inpatient   04/29/2016 1122 04/30/2016 1847 Full Code [id number]  Hillary Bow, MD ED      TOTAL TIME TAKING CARE OF THIS PATIENT: 40 minutes.    Avel Peace Makaiyah Schweiger M.D on 06/12/2018 at 10:19 AM  Between 7am to  6pm - Pager - [phone]  After 6pm go to [email or web address] - password [email or web address] [facility]  Office  [phone]  CC: Primary care physician; Mikey College, NP   Note: This dictation was prepared with Dragon dictation along with smaller phrase technology. Any transcriptional errors that result from this process are unintentional.

## 2018-06-12 NOTE — Progress Notes (Addendum)
Noah Darby, MD [address]  [place]  [place], [place] [address]  Main: [phone]  Fax: [phone] Pager: [phone]   Subjective: Tolerating full liquid diet.  Reports feeling significantly better compared to yesterday, still having some abdominal pain   Objective: Vital signs in last 24 hours: Vitals:   06/11/18 1619 06/12/18 0027 06/12/18 0827 06/12/18 1259  BP: 121/81 120/79 116/82 127/88  Pulse: 87 89 86 94  Resp:  18 18 18   Temp: 98.2 F (36.8 C) 98.7 F (37.1 C) 98 F (36.7 C) 98.8 F (37.1 C)  TempSrc: Oral Oral Oral Oral  SpO2: 98% 98% 98% 95%  Weight:      Height:       Weight change:   Intake/Output Summary (Last 24 hours) at 06/12/2018 1314 Last data filed at 06/11/2018 1858 Gross per 24 hour  Intake 70 ml  Output -  Net 70 ml     Exam: Heart:: Regular rate and rhythm or S1S2 present Lungs: clear to auscultation Abdomen: Mild tenderness in the epigastric region, no guarding or rigidity   Lab Results: CBC Latest Ref Rng & Units 06/10/2018 06/09/2018 05/07/2018  WBC 4.0 - 10.5 K/uL 10.7(H) 13.9(H) 9.1  Hemoglobin 13.0 - 17.0 g/dL 10.9(L) 13.0 11.8(L)  Hematocrit 39.0 - 52.0 % 33.3(L) 39.3 33.8(L)  Platelets 150 - 400 K/uL 245 331 209   CMP Latest Ref Rng & Units 06/11/2018 06/10/2018 06/09/2018  Glucose 70 - 99 mg/dL 92 97 128(H)  BUN 6 - 20 mg/dL 11 17 17   Creatinine 0.61 - 1.24 mg/dL 1.11 1.37(H) 1.29(H)  Sodium 135 - 145 mmol/L 133(L) 136 133(L)  Potassium 3.5 - 5.1 mmol/L 4.1 3.9 4.1  Chloride 98 - 111 mmol/L 102 106 104  CO2 22 - 32 mmol/L 23 23 23   Calcium 8.9 - 10.3 mg/dL 8.5(L) 8.1(L) 8.9  Total Protein 6.5 - 8.1 g/dL 7.5 7.0 8.2(H)  Total Bilirubin 0.3 - 1.2 mg/dL 0.8 0.7 0.6  Alkaline Phos 38 - 126 U/L 65 67 86  AST 15 - 41 U/L 13(L) 11(L) 17  ALT 0 - 44 U/L 9 9 11     Micro Results: No results found for this or any previous visit (from the past 240 hour(s)). Studies/Results: No results  found. Medications: I have reviewed the patient's current medications. Scheduled Meds: . amLODipine  10 mg Oral Daily  . atorvastatin  40 mg Oral Daily  . buPROPion  150 mg Oral Daily  . enoxaparin (LOVENOX) injection  40 mg Subcutaneous Q24H  . famotidine  20 mg Oral BID  . lipase/protease/amylase  72,000 Units Oral TID WC  . loratadine  10 mg Oral Daily  . losartan  100 mg Oral Daily  . nicotine  14 mg Transdermal Daily  . sertraline  100 mg Oral Daily   Continuous Infusions: PRN Meds:.fluticasone, gabapentin, HYDROcodone-acetaminophen, morphine injection, ondansetron **OR** ondansetron (ZOFRAN) IV, promethazine, traMADol, traZODone   Assessment: Active Problems:   Acute on chronic pancreatitis ([place]) Prior history of alcohol use, severe acute pancreatitis, pseudocyst with drainage in 2014. Patient used to follow-up with Dr. Stephanie Acre at [facility], pancreas clinic Developing pseudocyst of the pancreas, no evidence of infection or necrosis  Plan: Advance diet today Pain control He is on Zenpep as outpatient, advised him to take 2 capsules with each meal and 1 capsule with snack Advised him to quit smoking serum triglycerides normal Patient will be discharged home today  Follow-up with [facility] in 3 to 4 weeks  after discharge   LOS: 3 days   Noah Velasquez 06/12/2018, 1:14 PM

## 2018-06-14 ENCOUNTER — Telehealth: Payer: Self-pay

## 2018-06-14 NOTE — Telephone Encounter (Signed)
Transition Care Management Follow-up Telephone Call  Date of discharge and from where: 06/12/2018  How have you been since you were released from the hospital? "im doing fine today "  Any questions or concerns? No   Items Reviewed:  Did the pt receive and understand the discharge instructions provided? Yes   Medications obtained and verified? Yes  pt states no changes  Any new allergies since your discharge? No   Dietary orders reviewed? Yes  Do you have support at home? Yes   Functional Questionnaire: (I = Independent and D = Dependent) ADLs:   Bathing/Dressing- I  Meal Prep- I  Eating- I  Maintaining continence- I  Transferring/Ambulation- I  Managing Meds- I  Follow up appointments reviewed:   PCP Hospital f/u appt confirmed? Yes  Scheduled to see Inis Sizer on 06/17/2018 @ 1:20pm .  [facility] f/u appt confirmed? GI to call pt  Are transportation arrangements needed? No  patient set up medicaid transportation services  If their condition worsens, is the pt aware to call PCP or go to the Emergency Dept.? Yes  Was the patient provided with contact information for the PCP's office or ED? Yes  Was to pt encouraged to call back with questions or concerns? Yes

## 2018-06-17 ENCOUNTER — Inpatient Hospital Stay: Payer: Medicaid Other | Admitting: Nurse Practitioner

## 2018-06-20 ENCOUNTER — Telehealth: Payer: Self-pay | Admitting: Nurse Practitioner

## 2018-06-20 DIAGNOSIS — Z9181 History of falling: Secondary | ICD-10-CM

## 2018-06-20 DIAGNOSIS — M87052 Idiopathic aseptic necrosis of left femur: Secondary | ICD-10-CM | POA: Insufficient documentation

## 2018-06-20 NOTE — Telephone Encounter (Signed)
Pt called said that you was suppose to order a Shower chair he wanted to know when it was going to ordered he have fallen again in the shower.

## 2018-06-20 NOTE — Telephone Encounter (Signed)
The pt was notified that his order was done and will be faxed over to [facility].

## 2018-06-20 NOTE — Telephone Encounter (Signed)
I do not recall that communication, but since he has had another fall he is definitely a candidate for a shower chair.  Order written for 3-in-1 shower chair.  This can be picked up by patient and taken to medical supply store or sent to [facility] for delivery if patient unable to leave home to get it.

## 2018-06-20 NOTE — Telephone Encounter (Signed)
Amber with [facility] called needing last office notes  Fax to [phone]

## 2018-06-21 ENCOUNTER — Encounter: Payer: Self-pay | Admitting: Nurse Practitioner

## 2018-06-21 NOTE — Telephone Encounter (Signed)
Please fax September OV to [facility] with the order for 3-in-1 shower seat.

## 2018-06-21 NOTE — Telephone Encounter (Signed)
All the information faxed over to [facility].

## 2018-06-23 DIAGNOSIS — M87052 Idiopathic aseptic necrosis of left femur: Secondary | ICD-10-CM | POA: Diagnosis not present

## 2018-06-23 DIAGNOSIS — Z9181 History of falling: Secondary | ICD-10-CM | POA: Diagnosis not present

## 2018-06-28 ENCOUNTER — Telehealth: Payer: Self-pay | Admitting: Nurse Practitioner

## 2018-06-28 NOTE — Telephone Encounter (Signed)
Amber with [facility] received request for 3 in 1 shower chair and asked to have most recent office notes faxed to [phone].  Her call back number is [phone]

## 2018-06-28 NOTE — Telephone Encounter (Signed)
I faxed office notes from September to [facility]

## 2018-07-01 ENCOUNTER — Ambulatory Visit: Payer: Medicaid Other | Admitting: Nurse Practitioner

## 2018-07-04 ENCOUNTER — Telehealth: Payer: Self-pay | Admitting: Nurse Practitioner

## 2018-07-04 NOTE — Telephone Encounter (Signed)
I spoke with Amber and clarified that she received the order.

## 2018-07-04 NOTE — Telephone Encounter (Signed)
Noah Velasquez with [facility] needs office notes to go with recent order for shower chair.  Her call back number is [phone] ext [phone] and fax number is [phone]

## 2018-07-08 ENCOUNTER — Other Ambulatory Visit: Payer: Self-pay | Admitting: Family Medicine

## 2018-07-08 DIAGNOSIS — M87051 Idiopathic aseptic necrosis of right femur: Secondary | ICD-10-CM

## 2018-07-11 ENCOUNTER — Other Ambulatory Visit: Payer: Self-pay | Admitting: Nurse Practitioner

## 2018-07-11 DIAGNOSIS — M87051 Idiopathic aseptic necrosis of right femur: Secondary | ICD-10-CM

## 2018-07-11 NOTE — Telephone Encounter (Signed)
Pt called requesting refill on naproxen. Called into  [facility]

## 2018-07-18 ENCOUNTER — Inpatient Hospital Stay: Admission: RE | Admit: 2018-07-18 | Payer: Medicaid Other | Source: Ambulatory Visit

## 2018-07-22 ENCOUNTER — Other Ambulatory Visit: Payer: Medicaid Other

## 2018-07-27 ENCOUNTER — Other Ambulatory Visit: Payer: Self-pay

## 2018-07-27 ENCOUNTER — Encounter: Payer: Self-pay | Admitting: Nurse Practitioner

## 2018-07-27 ENCOUNTER — Encounter: Admission: RE | Payer: Self-pay | Source: Ambulatory Visit

## 2018-07-27 ENCOUNTER — Ambulatory Visit (INDEPENDENT_AMBULATORY_CARE_PROVIDER_SITE_OTHER): Payer: Medicaid Other | Admitting: Nurse Practitioner

## 2018-07-27 ENCOUNTER — Inpatient Hospital Stay: Admission: RE | Admit: 2018-07-27 | Payer: Medicaid Other | Source: Ambulatory Visit | Admitting: Orthopedic Surgery

## 2018-07-27 VITALS — BP 120/78 | HR 84 | Temp 99.0°F | Ht 67.5 in

## 2018-07-27 DIAGNOSIS — Z23 Encounter for immunization: Secondary | ICD-10-CM

## 2018-07-27 DIAGNOSIS — Z596 Low income: Secondary | ICD-10-CM | POA: Diagnosis not present

## 2018-07-27 DIAGNOSIS — F17210 Nicotine dependence, cigarettes, uncomplicated: Secondary | ICD-10-CM

## 2018-07-27 DIAGNOSIS — M87051 Idiopathic aseptic necrosis of right femur: Secondary | ICD-10-CM

## 2018-07-27 DIAGNOSIS — H0520 Unspecified exophthalmos: Secondary | ICD-10-CM

## 2018-07-27 DIAGNOSIS — H5052 Exophoria: Secondary | ICD-10-CM | POA: Diagnosis not present

## 2018-07-27 DIAGNOSIS — Z01818 Encounter for other preprocedural examination: Secondary | ICD-10-CM | POA: Diagnosis not present

## 2018-07-27 SURGERY — ARTHROPLASTY, HIP, TOTAL, ANTERIOR APPROACH
Anesthesia: Choice | Laterality: Right

## 2018-07-27 MED ORDER — NICOTINE 7 MG/24HR TD PT24: 7.0000 mg | MEDICATED_PATCH | Freq: Every day | TRANSDERMAL | 0 refills | Status: DC

## 2018-07-27 MED ORDER — NAPROXEN 500 MG PO TABS: 500.0000 mg | ORAL_TABLET | Freq: Two times a day (BID) | ORAL | 0 refills | Status: DC | PRN

## 2018-07-27 NOTE — Progress Notes (Deleted)
clear

## 2018-07-27 NOTE — Progress Notes (Signed)
Subjective:    Patient ID: Noah Velasquez, male    DOB: [birth year], 51 y.o.   MRN: [id number]  Noah Velasquez is a 52 y.o. male presenting on 07/27/2018 for Medical Clearance (Right hip replacement )   HPI Medical Clearance Patient presents today for medical clearance for right total hip arthroplasty.  He has no complaints today of chest pain, chest pressure, shortness of breath, lightheadedness, dizziness, headaches, abdominal pain, or recent infection.  He does note a recent sore on his left foot that opened and drained, but is now healing. - Had a little abdominal pain from pancreatitis for about 2 hours at Thanksgiving.  Patient has known chronic pancreatitis. -Right hip in nearly constant pain while upright and standing.  Patient does have frequent loss of sensation and function of right hip without predictability.  He uses a cane and is using a motorized wheelchair today.  Exophthalmos Eye muscle protrusion - has seen [facility] ophthalmology in the past, but no records are currently available.  He has had note of exophthalmos by his primary care provider previously at [facility].  He requests ophthalmology referral and follow-up for exophthalmos today.  Social History   Tobacco Use  . Smoking status: Current Every Day Smoker    Packs/day: 0.25    Years: 15.00    Pack years: 3.75    Types: Cigarettes  . Smokeless tobacco: Never Used  Substance Use Topics  . Alcohol use: Yes    Comment: 80 ounces a week  . Drug use: Yes    Types: Marijuana    Review of Systems  Constitutional: Negative for activity change, appetite change, fatigue and unexpected weight change.  HENT: Negative for congestion, hearing loss and trouble swallowing.   Eyes: Negative for visual disturbance.  Respiratory: Negative for choking, shortness of breath and wheezing.   Cardiovascular: Negative for chest pain and palpitations.  Gastrointestinal: Negative for abdominal pain, blood in stool, constipation and diarrhea.    Genitourinary: Negative for difficulty urinating, discharge, flank pain, genital sores, penile pain, penile swelling, scrotal swelling and testicular pain.  Musculoskeletal: Positive for arthralgias. Negative for back pain and myalgias.  Skin: Negative for color change, rash and wound.  Allergic/Immunologic: Negative for environmental allergies.  Neurological: Negative for dizziness, seizures, weakness and headaches.  Psychiatric/Behavioral: Negative for behavioral problems, decreased concentration, dysphoric mood, sleep disturbance and suicidal ideas. The patient is not nervous/anxious.    Per HPI unless specifically indicated above     Objective:    BP 120/78 (BP Location: Left Arm, Patient Position: Sitting, Cuff Size: Normal) Comment (Cuff Size): manual  Pulse 84   Temp 99 F (37.2 C) (Oral)   Ht 5' 7.5" (1.715 m)   BMI 27.42 kg/m   Wt Readings from Last 3 Encounters:  06/09/18 177 lb 11.2 oz (80.6 kg)  05/09/18 176 lb 9.6 oz (80.1 kg)  05/08/18 184 lb (83.5 kg)    Physical Exam  Constitutional: He is oriented to person, place, and time. He appears well-developed and well-nourished. No distress.  HENT:  Head: Normocephalic and atraumatic.  Eyes:  Left eye with exophthalmos.  All EOM intact.  Neck: Normal range of motion. Neck supple. Carotid bruit is not present.  Cardiovascular: Normal rate, regular rhythm, S1 normal, S2 normal, normal heart sounds and intact distal pulses.  Pulmonary/Chest: Effort normal and breath sounds normal. No respiratory distress.  Abdominal: Soft. Bowel sounds are normal. He exhibits no distension. There is no hepatosplenomegaly. There is no tenderness. No hernia.  Musculoskeletal:  He exhibits no edema (pedal).       Feet:  Neurological: He is alert and oriented to person, place, and time. He has normal strength and normal reflexes. No cranial nerve deficit or sensory deficit. He displays a negative Romberg sign. Gait normal.  Skin: Skin is warm  and dry. Capillary refill takes less than 2 seconds.  Psychiatric: He has a normal mood and affect. His behavior is normal. Judgment and thought content normal.  Vitals reviewed.   Results for orders placed or performed during the hospital encounter of 06/09/18  Lipase, blood  Result Value Ref Range   Lipase 72 (H) 11 - 51 U/L  Comprehensive metabolic panel  Result Value Ref Range   Sodium 133 (L) 135 - 145 mmol/L   Potassium 4.1 3.5 - 5.1 mmol/L   Chloride 104 98 - 111 mmol/L   CO2 23 22 - 32 mmol/L   Glucose, Bld 128 (H) 70 - 99 mg/dL   BUN 17 6 - 20 mg/dL   Creatinine, Ser 1.29 (H) 0.61 - 1.24 mg/dL   Calcium 8.9 8.9 - 10.3 mg/dL   Total Protein 8.2 (H) 6.5 - 8.1 g/dL   Albumin 4.0 3.5 - 5.0 g/dL   AST 17 15 - 41 U/L   ALT 11 0 - 44 U/L   Alkaline Phosphatase 86 38 - 126 U/L   Total Bilirubin 0.6 0.3 - 1.2 mg/dL   GFR calc non Af Amer >60 >60 mL/min   GFR calc Af Amer >60 >60 mL/min   Anion gap 6 5 - 15  CBC  Result Value Ref Range   WBC 13.9 (H) 4.0 - 10.5 K/uL   RBC 4.39 4.22 - 5.81 MIL/uL   Hemoglobin 13.0 13.0 - 17.0 g/dL   HCT 39.3 39.0 - 52.0 %   MCV 89.5 80.0 - 100.0 fL   MCH 29.6 26.0 - 34.0 pg   MCHC 33.1 30.0 - 36.0 g/dL   RDW 14.8 11.5 - 15.5 %   Platelets 331 150 - 400 K/uL   nRBC 0.0 0.0 - 0.2 %  Urinalysis, Complete w Microscopic  Result Value Ref Range   Color, Urine YELLOW (A) YELLOW   APPearance CLEAR (A) CLEAR   Specific Gravity, Urine >1.046 (H) 1.005 - 1.030   pH 5.0 5.0 - 8.0   Glucose, UA NEGATIVE NEGATIVE mg/dL   Hgb urine dipstick NEGATIVE NEGATIVE   Bilirubin Urine NEGATIVE NEGATIVE   Ketones, ur NEGATIVE NEGATIVE mg/dL   Protein, ur NEGATIVE NEGATIVE mg/dL   Nitrite NEGATIVE NEGATIVE   Leukocytes, UA NEGATIVE NEGATIVE   RBC / HPF 0-5 0 - 5 RBC/hpf   WBC, UA 0-5 0 - 5 WBC/hpf   Bacteria, UA NONE SEEN NONE SEEN   Squamous Epithelial / LPF NONE SEEN 0 - 5   Mucus PRESENT   CBC  Result Value Ref Range   WBC 10.7 (H) 4.0 - 10.5 K/uL     RBC 3.64 (L) 4.22 - 5.81 MIL/uL   Hemoglobin 10.9 (L) 13.0 - 17.0 g/dL   HCT 33.3 (L) 39.0 - 52.0 %   MCV 91.5 80.0 - 100.0 fL   MCH 29.9 26.0 - 34.0 pg   MCHC 32.7 30.0 - 36.0 g/dL   RDW 14.9 11.5 - 15.5 %   Platelets 245 150 - 400 K/uL   nRBC 0.0 0.0 - 0.2 %  Comprehensive metabolic panel  Result Value Ref Range   Sodium 136 135 - 145 mmol/L   Potassium 3.9  3.5 - 5.1 mmol/L   Chloride 106 98 - 111 mmol/L   CO2 23 22 - 32 mmol/L   Glucose, Bld 97 70 - 99 mg/dL   BUN 17 6 - 20 mg/dL   Creatinine, Ser 1.37 (H) 0.61 - 1.24 mg/dL   Calcium 8.1 (L) 8.9 - 10.3 mg/dL   Total Protein 7.0 6.5 - 8.1 g/dL   Albumin 3.1 (L) 3.5 - 5.0 g/dL   AST 11 (L) 15 - 41 U/L   ALT 9 0 - 44 U/L   Alkaline Phosphatase 67 38 - 126 U/L   Total Bilirubin 0.7 0.3 - 1.2 mg/dL   GFR calc non Af Amer 58 (L) >60 mL/min   GFR calc Af Amer >60 >60 mL/min   Anion gap 7 5 - 15  Lipase, blood  Result Value Ref Range   Lipase 60 (H) 11 - 51 U/L  Comprehensive metabolic panel  Result Value Ref Range   Sodium 133 (L) 135 - 145 mmol/L   Potassium 4.1 3.5 - 5.1 mmol/L   Chloride 102 98 - 111 mmol/L   CO2 23 22 - 32 mmol/L   Glucose, Bld 92 70 - 99 mg/dL   BUN 11 6 - 20 mg/dL   Creatinine, Ser 1.11 0.61 - 1.24 mg/dL   Calcium 8.5 (L) 8.9 - 10.3 mg/dL   Total Protein 7.5 6.5 - 8.1 g/dL   Albumin 3.3 (L) 3.5 - 5.0 g/dL   AST 13 (L) 15 - 41 U/L   ALT 9 0 - 44 U/L   Alkaline Phosphatase 65 38 - 126 U/L   Total Bilirubin 0.8 0.3 - 1.2 mg/dL   GFR calc non Af Amer >60 >60 mL/min   GFR calc Af Amer >60 >60 mL/min   Anion gap 8 5 - 15  Lipase, blood  Result Value Ref Range   Lipase 34 11 - 51 U/L  Triglycerides  Result Value Ref Range   Triglycerides 79 <150 mg/dL  Lipid panel  Result Value Ref Range   Cholesterol 114 0 - 200 mg/dL   Triglycerides 89 <150 mg/dL   HDL 30 (L) >40 mg/dL   Total CHOL/HDL Ratio 3.8 RATIO   VLDL 18 0 - 40 mg/dL   LDL Cholesterol 66 0 - 99 mg/dL   07/27/18 EKG: NSR with L  BBB  HR 77bpm PR 140ms QRS 98ms     QT 38ms   QTc 430ms    Assessment & Plan:   Problem List Items Addressed This Visit    None    Visit Diagnoses    Preoperative evaluation to rule out surgical contraindication    -  Primary   Relevant Medications   nicotine (NICODERM CQ - DOSED IN MG/24 HR) 7 mg/24hr patch   naproxen (NAPROSYN) 500 MG tablet   Other Relevant Orders   Hemoglobin A1c   COMPLETE METABOLIC PANEL WITH GFR   CBC with Differential/Platelet   Lipid panel   TSH   T4, free   Ambulatory referral to Ophthalmology   EKG 12-Lead   Need for diphtheria-tetanus-pertussis (Tdap) vaccine       Relevant Orders   Tdap vaccine greater than or equal to 7yo IM   Cigarette nicotine dependence without complication       Relevant Medications   nicotine (NICODERM CQ - DOSED IN MG/24 HR) 7 mg/24hr patch   Avascular necrosis of bone of right hip (HCC)       Relevant Medications   naproxen (NAPROSYN)  500 MG tablet   Exophthalmos       Relevant Orders   TSH   T4, free   Thyrotropin receptor autoabs      # Pre-op clearance for non-cardiac surgery today, Right total hip arthroplasty (likely will be intermediate risk with history of ARF, anemia, chronic pancreatitis), general anesthesia previously tolerated - Known cardiac history of hypertension. Appropriate functional status >4 METs - Active smoker (increased risk) and desires to quit today, so will be starting nicotine patches at 7 mg dose for current cigarette usage.  He has cut back significantly.  Plan: 1. Cleared for elective surgery. Completed provided pre-op paperwork per Dr. Harlow Mares' office 2. Will check BMET for Cr monitoring 3. Repeat EKG today without any new changes since last done 05/06/2018 4. Elevated BP today improved on re-check, no change recommended prior to surgery  # tobacco dependence Patient has already reduced smoking amount and wants to quit.  He cites assistance with nicotine patches in past while  hospitalized and would like to resume these.  START at 7 mg daily since patient smokes 6-7 cigarettes daily.  Is slightly under- dosed for total daily dose nicotine, but should be helpful to prevent cravings. - Discussion today >5 minutes (<10 minutes) specifically on counseling on risks of tobacco use, complications, treatment, smoking cessation.  - FOLLOW-UP 3 months  # Exophthalmos Patient without known screening for Graves disease.  Single eye (LEFT) exophthalmos.  No opthalomology visit, patient report of transient vision changes in past. - Labs today for thyroid function, graves disease. - Referral to ophthalmology. - FOLLOW-UP prn.  Meds ordered this encounter  Medications  . nicotine (NICODERM CQ - DOSED IN MG/24 HR) 7 mg/24hr patch    Sig: Place 1 patch (7 mg total) onto the skin daily.    Dispense:  28 patch    Refill:  0    Order Specific Question:   Supervising Provider    Answer:   Olin Hauser [[id number]]  . naproxen (NAPROSYN) 500 MG tablet    Sig: Take 1 tablet (500 mg total) by mouth 2 (two) times daily as needed for moderate pain.    Dispense:  45 tablet    Refill:  0    Order Specific Question:   Supervising Provider    Answer:   Olin Hauser [[id number]]    Follow up plan: Return in about 3 months (around 10/26/2018) for hypertension.  Cassell Smiles, DNP, AGPCNP-BC Adult Gerontology Primary Care Nurse Practitioner [facility] 07/27/2018, 10:26 AM

## 2018-07-27 NOTE — Patient Instructions (Addendum)
Noah Velasquez,   Thank you for coming in to clinic today.  1. For your eye care  Your provider would like to you have your annual eye exam. Please contact your current eye doctor or here are some good options for you to contact.   [facility] [address] [place], [place] [address] Phone: [phone] [email or web address]  2. Start nicotine patch 7 mg once daily.  Change every day and place in a new spot on your arm/shoulder/back or hip/buttock. - When you start your patches,  Stop smoking.  If you continue to smoke on the patch, you will increase your nicotine requirement. - After you get through 28 days, stop the patches and continue off smoking.  3. If all labs are normal, you will be cleared for surgery.  Please schedule a follow-up appointment with Cassell Smiles, AGNP. Return in about 3 months (around 10/26/2018) for hypertension.  If you have any other questions or concerns, please feel free to call the clinic or send a message through [facility]. You may also schedule an earlier appointment if necessary.  You will receive a survey after today's visit either digitally by e-mail or paper by [employer]. Your experiences and feedback matter to [place].  Please respond so we know how we are doing as we provide care for you.   Cassell Smiles, DNP, AGNP-BC Adult Gerontology Nurse Practitioner [place]

## 2018-07-28 ENCOUNTER — Other Ambulatory Visit: Payer: Self-pay | Admitting: Family Medicine

## 2018-07-28 DIAGNOSIS — M87051 Idiopathic aseptic necrosis of right femur: Secondary | ICD-10-CM

## 2018-07-28 DIAGNOSIS — Z01818 Encounter for other preprocedural examination: Secondary | ICD-10-CM

## 2018-07-29 ENCOUNTER — Telehealth: Payer: Self-pay

## 2018-07-29 ENCOUNTER — Encounter: Payer: Self-pay | Admitting: Nurse Practitioner

## 2018-07-29 DIAGNOSIS — Z23 Encounter for immunization: Secondary | ICD-10-CM

## 2018-07-29 NOTE — Addendum Note (Signed)
Addended by: Wilson Singer on: 07/29/2018 10:24 AM   Modules accepted: Orders

## 2018-07-29 NOTE — Progress Notes (Signed)
Pre-operative clearance form has been completed for [facility].  Pt is at moderate risk for surgery planned (LEFT total hip arthroplasty) with pre-existing CKD stage III and current smoking.  Patient has other controlled medical problems.  Pt may proceed with planned procedure.  Ensure adequate hydration and avoid nephrotoxic agents post surgery.  - D/C naproxen today. - Continue with gabapentin and acetaminophen for pain control pre-op.  May consider low dose tramadol if needed.

## 2018-07-29 NOTE — Telephone Encounter (Signed)
Result note mailed out to patient, because his phone is disconnected.

## 2018-08-01 ENCOUNTER — Telehealth: Payer: Self-pay

## 2018-08-01 LAB — COMPLETE METABOLIC PANEL WITH GFR
AG Ratio: 0.9 (calc) — ABNORMAL LOW (ref 1.0–2.5)
ALT: 8 U/L — ABNORMAL LOW (ref 9–46)
AST: 14 U/L (ref 10–35)
Albumin: 4.1 g/dL (ref 3.6–5.1)
Alkaline phosphatase (APISO): 101 U/L (ref 40–115)
BUN/Creatinine Ratio: 10 (calc) (ref 6–22)
BUN: 16 mg/dL (ref 7–25)
CO2: 25 mmol/L (ref 20–32)
Calcium: 9.9 mg/dL (ref 8.6–10.3)
Chloride: 102 mmol/L (ref 98–110)
Creat: 1.6 mg/dL — ABNORMAL HIGH (ref 0.70–1.33)
GFR, Est African American: 56 mL/min/{1.73_m2} — ABNORMAL LOW (ref >=60)
GFR, Est Non African American: 48 mL/min/{1.73_m2} — ABNORMAL LOW (ref >=60)
Globulin: 4.6 g/dL (calc) — ABNORMAL HIGH (ref 1.9–3.7)
Glucose, Bld: 117 mg/dL — ABNORMAL HIGH (ref 65–99)
Potassium: 4.1 mmol/L (ref 3.5–5.3)
Sodium: 134 mmol/L — ABNORMAL LOW (ref 135–146)
Total Bilirubin: 0.4 mg/dL (ref 0.2–1.2)
Total Protein: 8.7 g/dL — ABNORMAL HIGH (ref 6.1–8.1)

## 2018-08-01 LAB — LIPID PANEL
Cholesterol: 144 mg/dL (ref <200)
HDL: 45 mg/dL (ref >40)
LDL Cholesterol (Calc): 83 mg/dL (calc)
Non-HDL Cholesterol (Calc): 99 mg/dL (calc) (ref <130)
Total CHOL/HDL Ratio: 3.2 (calc) (ref <5.0)
Triglycerides: 80 mg/dL (ref <150)

## 2018-08-01 LAB — T4, FREE: Free T4: 1.3 ng/dL (ref 0.8–1.8)

## 2018-08-01 LAB — CBC WITH DIFFERENTIAL/PLATELET
Absolute Monocytes: 365 cells/uL (ref 200–950)
Basophils Absolute: 57 cells/uL (ref 0–200)
Basophils Relative: 0.7 %
Eosinophils Absolute: 300 cells/uL (ref 15–500)
Eosinophils Relative: 3.7 %
HCT: 44.1 % (ref 38.5–50.0)
Hemoglobin: 14.7 g/dL (ref 13.2–17.1)
Lymphs Abs: 2009 cells/uL (ref 850–3900)
MCH: 29.5 pg (ref 27.0–33.0)
MCHC: 33.3 g/dL (ref 32.0–36.0)
MCV: 88.4 fL (ref 80.0–100.0)
MPV: 10.3 fL (ref 7.5–12.5)
Monocytes Relative: 4.5 %
Neutro Abs: 5370 cells/uL (ref 1500–7800)
Neutrophils Relative %: 66.3 %
Platelets: 337 10*3/uL (ref 140–400)
RBC: 4.99 10*6/uL (ref 4.20–5.80)
RDW: 14.9 % (ref 11.0–15.0)
Total Lymphocyte: 24.8 %
WBC: 8.1 10*3/uL (ref 3.8–10.8)

## 2018-08-01 LAB — HEMOGLOBIN A1C
Hgb A1c MFr Bld: 5.8 % of total Hgb — ABNORMAL HIGH (ref <5.7)
Mean Plasma Glucose: 120 (calc)
eAG (mmol/L): 6.6 (calc)

## 2018-08-01 LAB — TSH: TSH: 2.07 mIU/L (ref 0.40–4.50)

## 2018-08-01 LAB — THYROTROPIN RECEPTOR AUTOABS: Thyrotropin Receptor Ab: 7.9 % (ref <=16.0)

## 2018-08-01 NOTE — Telephone Encounter (Signed)
The pt was notified. He verbalize understanding.

## 2018-08-01 NOTE — Telephone Encounter (Signed)
Patient is correct.  He has not had prior T2DM.   Hgb A1C is 5.8% and indicates risk of developing diabetes or prediabtes.  This still does not change his presurgical clearance.  Focus on increasing activity after surgery.

## 2018-08-01 NOTE — Telephone Encounter (Signed)
The pt was notified of lab results. He questions the result stating his diabetes is under control . The pt states he's never been diagnose w/ diabetes. Please advise

## 2018-09-02 ENCOUNTER — Telehealth: Payer: Self-pay | Admitting: Nurse Practitioner

## 2018-09-02 DIAGNOSIS — F5105 Insomnia due to other mental disorder: Secondary | ICD-10-CM

## 2018-09-02 DIAGNOSIS — F4310 Post-traumatic stress disorder, unspecified: Secondary | ICD-10-CM

## 2018-09-02 DIAGNOSIS — F39 Unspecified mood [affective] disorder: Secondary | ICD-10-CM

## 2018-09-02 DIAGNOSIS — F329 Major depressive disorder, single episode, unspecified: Secondary | ICD-10-CM

## 2018-09-02 NOTE — Telephone Encounter (Signed)
I called patient for more information and to share the following:  1.Surgeon will take care of home health referral  2. Psychiatry and therapy referral: why? Patient wants to re-establish locally from [facility].  Prefers psychiatry in [place] due to transportation problems.  [facility] referral placed.  Please mail information below about self-referral for [facility], etc. to patient's home in case [facility] is unable to see him.    PSYCHIATRY / [place] Internal Referral:  Referral has been placed.  They will call to schedule or decline the referral. [place] ([facility]) Address: [address], [place], [place] [address] Hours: 8:30AM-5PM Phone: [phone]   Self Referral [facility]) [place] [address] [place], [place] [address] Phone: [phone]  [employer], available walk-in 9am-4pm M-F [address], [place] [address] Hours: [place] (M-F, walk in available) [phone] [place]   Address: [place], [place], [place] [address] Hours: 8AM-5PM (accepts walk in to establish) Phone: [phone]  [address], [place], [place] [address] [phone]

## 2018-09-02 NOTE — Telephone Encounter (Signed)
Need referral

## 2018-09-02 NOTE — Telephone Encounter (Signed)
Pt needs a referral for psychiatrist and therapist.  He will be having surgery soon and wants to sign up for [facility] home health with nurse Vita Erm [phone].  Pt's call back number is [phone]

## 2018-09-05 NOTE — Telephone Encounter (Signed)
Covering inbox for Noah Velasquez, AGPCNP-BC while she is out of office.  I agree I would not provide rx Tramadol for this patient as his hip pain should be managed by Orthopedic surgery at this point, and they would manage any post-op pain that he would have. If he is asking about pain medicine prior to surgery, then they should still be able to treat this if it is needed.  Nobie Putnam, [facility] 09/05/2018, 1:30 PM

## 2018-09-05 NOTE — Telephone Encounter (Signed)
Patient advised as per Ander Purpura. He is asking for Rx for tramadol for HIP pain and he is having surgery soon since he was allergic to naprosyn was not Rx by Lauren in past . Advise patient to ask his surgeon but he demanded for Rx. Patient is aware that [facility] in not in clinic today and tomorrow.

## 2018-09-05 NOTE — Telephone Encounter (Signed)
Left message for patient to call back  

## 2018-09-06 NOTE — Telephone Encounter (Signed)
Patient advised.

## 2018-10-11 ENCOUNTER — Other Ambulatory Visit: Payer: Self-pay | Admitting: Nurse Practitioner

## 2018-10-11 DIAGNOSIS — J302 Other seasonal allergic rhinitis: Secondary | ICD-10-CM

## 2018-10-11 DIAGNOSIS — I10 Essential (primary) hypertension: Secondary | ICD-10-CM

## 2018-10-11 MED ORDER — LORATADINE 10 MG PO TABS: 10.0000 mg | ORAL_TABLET | Freq: Every day | ORAL | 11 refills | Status: DC

## 2018-10-11 MED ORDER — LOSARTAN POTASSIUM 100 MG PO TABS: 100.0000 mg | ORAL_TABLET | Freq: Every day | ORAL | 0 refills | Status: DC

## 2018-10-11 MED ORDER — FLUTICASONE PROPIONATE 50 MCG/ACT NA SUSP: 1.0000 | Freq: Every day | NASAL | 11 refills | Status: DC | PRN

## 2018-10-11 NOTE — Telephone Encounter (Signed)
Zoloft needs to be refilled by his psychiatrist.    Other meds filled - make sure to keep March appointment for BP review before any additional fills for Cozaar will be placed.

## 2018-10-11 NOTE — Telephone Encounter (Signed)
Pt. Called requesting  Refill on Cozaar, Claritin, Zoloft, Flonase calledingto

## 2018-10-12 NOTE — Telephone Encounter (Signed)
The pt was notified that prescriptions refills was sent over to his pharmacy. I also informed him that is important to keep his f/u appt in order to get anymore refills. He verbalize understanding, no questions or concerns.

## 2018-10-18 ENCOUNTER — Ambulatory Visit: Payer: Self-pay | Admitting: Nurse Practitioner

## 2018-10-20 ENCOUNTER — Ambulatory Visit (INDEPENDENT_AMBULATORY_CARE_PROVIDER_SITE_OTHER): Payer: Medicaid Other | Admitting: Nurse Practitioner

## 2018-10-20 ENCOUNTER — Other Ambulatory Visit: Payer: Self-pay

## 2018-10-20 ENCOUNTER — Encounter: Payer: Self-pay | Admitting: Nurse Practitioner

## 2018-10-20 VITALS — BP 111/69 | HR 100 | Temp 98.4°F | Ht 67.5 in

## 2018-10-20 DIAGNOSIS — K860 Alcohol-induced chronic pancreatitis: Secondary | ICD-10-CM

## 2018-10-20 DIAGNOSIS — F5104 Psychophysiologic insomnia: Secondary | ICD-10-CM

## 2018-10-20 DIAGNOSIS — F329 Major depressive disorder, single episode, unspecified: Secondary | ICD-10-CM | POA: Diagnosis not present

## 2018-10-20 DIAGNOSIS — M546 Pain in thoracic spine: Secondary | ICD-10-CM

## 2018-10-20 DIAGNOSIS — F4310 Post-traumatic stress disorder, unspecified: Secondary | ICD-10-CM | POA: Diagnosis not present

## 2018-10-20 DIAGNOSIS — I10 Essential (primary) hypertension: Secondary | ICD-10-CM | POA: Diagnosis not present

## 2018-10-20 DIAGNOSIS — Z596 Low income: Secondary | ICD-10-CM | POA: Diagnosis not present

## 2018-10-20 LAB — POCT URINALYSIS DIPSTICK
Bilirubin, UA: NEGATIVE
Blood, UA: NEGATIVE
Glucose, UA: NEGATIVE
Ketones, UA: NEGATIVE
Leukocytes, UA: NEGATIVE
Nitrite, UA: NEGATIVE
Protein, UA: NEGATIVE
Spec Grav, UA: 1.015 (ref 1.010–1.025)
Urobilinogen, UA: 0.2 E.U./dL
pH, UA: 5 (ref 5.0–8.0)

## 2018-10-20 MED ORDER — AMLODIPINE BESYLATE 10 MG PO TABS: 10.0000 mg | ORAL_TABLET | Freq: Every day | ORAL | 1 refills | Status: DC

## 2018-10-20 MED ORDER — LOSARTAN POTASSIUM 100 MG PO TABS: 100.0000 mg | ORAL_TABLET | Freq: Every day | ORAL | 1 refills | Status: DC

## 2018-10-20 MED ORDER — PANCRELIPASE (LIP-PROT-AMYL) 36000-114000 UNITS PO CPEP: 72000.0000 [IU] | ORAL_CAPSULE | Freq: Three times a day (TID) | ORAL | 1 refills | Status: DC

## 2018-10-20 MED ORDER — BUPROPION HCL ER (XL) 150 MG PO TB24: 150.0000 mg | ORAL_TABLET | Freq: Every day | ORAL | 1 refills | Status: DC

## 2018-10-20 MED ORDER — TRAZODONE HCL 150 MG PO TABS: 150.0000 mg | ORAL_TABLET | Freq: Every evening | ORAL | 5 refills | Status: DC | PRN

## 2018-10-20 MED ORDER — SERTRALINE HCL 100 MG PO TABS: 100.0000 mg | ORAL_TABLET | Freq: Every day | ORAL | 1 refills | Status: DC

## 2018-10-20 NOTE — Progress Notes (Signed)
Subjective:    Patient ID: Noah Velasquez, male    DOB: [birth year], 53 y.o.   MRN: [id number]  Noah Velasquez is a 53 y.o. male presenting on 10/20/2018 for Anxiety (PTSD, pt requesting anxiety medications. He's been off his zoloft, wellbruting x 1 mth)   HPI Anxiety/PTSD/Insomnia Patient has not been on Zoloft or Wellbutrin for over 1 month.  He was previously seen by psychiatry due to depression and PTSD.  When on these meds he was well controlled. - Patient is now having paranoid thoughts and "war stuff" and is not able to control his temper as well.  Is also having increased thoughts of hurting other people without plans and dreams/flashbacks.  These are about 3 times per week right now. - Patient was having none of these on medications.  Once per month may have had a PTSD flashback dream. - patient continues also on trazodone for insomnia.  Right shoulder pain, thoracic back pain Ibuprofen - was previously 8 pills per day - not taking now. acetaminophen - also not taking regularly. Since labs showing decreased renal function, patient has been choosing Tylenol recently.    Pancreatitis Patient reports having cut back some on alcohol, but continues regular use.  Patient has had no recent increase in abdominal pain, but continues to use Creon.  Requests refill.  Hypertension - He is not checking BP at home or outside of clinic.    - Current medications are being tolerated well without side effects - He is not currently symptomatic. - Pt denies headache, lightheadedness, dizziness, changes in vision, chest tightness/pressure, palpitations, leg swelling, sudden loss of speech or loss of consciousness. - He  reports no regular exercise routine. - His diet is moderate to high in salt, moderate to high in fat, and moderate to high in carbohydrates.  Patient eats frequent fast food, convenience meals.  GERD Patient is concerned about ranitidine and cancer causing agents.   - Patient has not  rescheduled surgery.  Social History   Tobacco Use  . Smoking status: Current Every Day Smoker    Packs/day: 0.25    Years: 15.00    Pack years: 3.75    Types: Cigarettes  . Smokeless tobacco: Never Used  Substance Use Topics  . Alcohol use: Yes    Comment: 80 ounces a week  . Drug use: Yes    Types: Marijuana    Review of Systems Per HPI unless specifically indicated above     Objective:    BP 111/69 (BP Location: Left Arm, Patient Position: Sitting, Cuff Size: Normal)   Pulse 100   Temp 98.4 F (36.9 C) (Oral)   Ht 5' 7.5" (1.715 m)   BMI 27.42 kg/m   Wt Readings from Last 3 Encounters:  06/09/18 177 lb 11.2 oz (80.6 kg)  05/09/18 176 lb 9.6 oz (80.1 kg)  05/08/18 184 lb (83.5 kg)    Physical Exam Vitals signs reviewed.  Constitutional:      General: He is not in acute distress.    Appearance: He is well-developed.  HENT:     Head: Normocephalic and atraumatic.  Cardiovascular:     Rate and Rhythm: Normal rate and regular rhythm.     Pulses:          Radial pulses are 2+ on the right side and 2+ on the left side.       Posterior tibial pulses are 1+ on the right side and 1+ on the left side.  Heart sounds: Normal heart sounds, S1 normal and S2 normal.  Pulmonary:     Effort: Pulmonary effort is normal. No respiratory distress.     Breath sounds: Normal breath sounds and air entry.  Musculoskeletal:     Right lower leg: No edema.     Left lower leg: No edema.     Comments: RIGHT Shoulder Inspection: Normal appearance bilateral symmetrical Palpation: Non-tender to palpation over anterior, lateral, or posterior shoulder  ROM: Full intact active ROM forward flexion, abduction, internal / external rotation, symmetrical Special Testing: Rotator cuff testing negative for weakness with supraspinatus full can and empty can test, O'brien's negative for labral pain, Hawkin's AC impingement negative for pain Strength: Normal strength 5/5 flex/ext, ext rot / int  rot, grip, rotator cuff str testing. Neurovascular: Distally intact pulses, sensation to light touch  Skin:    General: Skin is warm and dry.     Capillary Refill: Capillary refill takes less than 2 seconds.  Neurological:     General: No focal deficit present.     Mental Status: He is alert and oriented to person, place, and time. Mental status is at baseline.     Gait: Gait abnormal (antalgic gait due to RIGHT hip pain.  Pt uses motorized wheelchair for public transportation with physical handicap due to problems w/ stamina).  Psychiatric:        Attention and Perception: Attention normal.        Mood and Affect: Mood and affect normal.        Behavior: Behavior normal. Behavior is cooperative.        Thought Content: Thought content normal.        Judgment: Judgment normal.    Results for orders placed or performed in visit on 10/20/18  POCT urinalysis dipstick  Result Value Ref Range   Color, UA Yellow    Clarity, UA Clear    Glucose, UA Negative Negative   Bilirubin, UA Negative    Ketones, UA Negative    Spec Grav, UA 1.015 1.010 - 1.025   Blood, UA Negative    pH, UA 5.0 5.0 - 8.0   Protein, UA Negative Negative   Urobilinogen, UA 0.2 0.2 or 1.0 E.U./dL   Nitrite, UA Negative    Leukocytes, UA Negative Negative   Appearance     Odor        Assessment & Plan:   Problem List Items Addressed This Visit      Cardiovascular and Mediastinum   Essential hypertension Controlled hypertension.  BP goal < 130/80.  Pt is not working on any additional lifestyle modifications.  Taking medications tolerating well without side effects. Complications - acute renal insufficiency due to medication (NSAID)  Plan: 1. Continue taking amlodipine, losartan 2. Obtain labs again today for follow-up renal function  3. Encouraged heart healthy diet and increasing exercise to 30 minutes most days of the week. 4. Check BP 1-2 x per week at home, keep log, and bring to clinic at next  appointment. 5. Follow up 6 months.     Relevant Medications   amLODipine (NORVASC) 10 MG tablet   losartan (COZAAR) 100 MG tablet     Digestive   Pancreatitis Stable today on exam.  Medications tolerated without side effects.  Continue at current doses.  Refills provided.  Reviewed recent labs today. Followup 6 months.    Relevant Medications   lipase/protease/amylase (CREON) 36000 UNITS CPEP capsule     Other   Depression - Primary  Worsening recently off medications with return of PTSD nightmares more frequently.  Previously moderately well controlled. Medications tolerated without side effects.  Continue at current doses.  Refills provided.  Instructed pt to resume sertraline before Wellbutrin.  Stagger restart to avoid side effects.  Resume prior doses. Followup 6 months.    Relevant Medications   sertraline (ZOLOFT) 100 MG tablet   buPROPion (WELLBUTRIN XL) 150 MG 24 hr tablet   traZODone (DESYREL) 150 MG tablet   Posttraumatic stress disorder See Depression above.   Relevant Medications   sertraline (ZOLOFT) 100 MG tablet   buPROPion (WELLBUTRIN XL) 150 MG 24 hr tablet   traZODone (DESYREL) 150 MG tablet    Other Visit Diagnoses    Psychophysiological insomnia     See Depression above.  Continue trazodone. Refills provided.  Follow-up 6 months.   Relevant Medications   traZODone (DESYREL) 150 MG tablet   Acute left-sided thoracic back pain     RIGHT shoulder pain Likely partial rotator cuff tear RIGHT shoulder and likely impacting thoracic back pain.  Patient without UTI today.  Continue to treat msk pain as above for shoulder pain.  - START shoulder ROM activities - Continue current OTC analgesics. - Recommend future physical therapy - Follow-up with orthopedics about shoulder pain when visiting again for hip surgery.   Relevant Orders   POCT urinalysis dipstick (Completed)      Meds ordered this encounter  Medications  . sertraline (ZOLOFT) 100 MG tablet    Sig:  Take 1 tablet (100 mg total) by mouth daily.    Dispense:  90 tablet    Refill:  1    Order Specific Question:   Supervising Provider    Answer:   Olin Hauser [[id number]]  . buPROPion (WELLBUTRIN XL) 150 MG 24 hr tablet    Sig: Take 1 tablet (150 mg total) by mouth daily.    Dispense:  90 tablet    Refill:  1    Order Specific Question:   Supervising Provider    Answer:   Olin Hauser [[id number]]  . traZODone (DESYREL) 150 MG tablet    Sig: Take 1 tablet (150 mg total) by mouth at bedtime as needed for sleep.    Dispense:  30 tablet    Refill:  5    Order Specific Question:   Supervising Provider    Answer:   Olin Hauser [[id number]]  . amLODipine (NORVASC) 10 MG tablet    Sig: Take 1 tablet (10 mg total) by mouth daily.    Dispense:  90 tablet    Refill:  1    Order Specific Question:   Supervising Provider    Answer:   Olin Hauser [[id number]]  . losartan (COZAAR) 100 MG tablet    Sig: Take 1 tablet (100 mg total) by mouth daily.    Dispense:  90 tablet    Refill:  1    Order Specific Question:   Supervising Provider    Answer:   Olin Hauser [[id number]]  . DISCONTD: lipase/protease/amylase (CREON) 36000 UNITS CPEP capsule    Sig: Take 2 capsules (72,000 Units total) by mouth 3 (three) times daily with meals.    Dispense:  270 capsule    Refill:  1    Order Specific Question:   Supervising Provider    Answer:   Olin Hauser [[id number]]  . lipase/protease/amylase (CREON) 36000 UNITS CPEP capsule    Sig: Take 2 capsules (72,000 Units total) by  mouth 3 (three) times daily with meals.    Dispense:  270 capsule    Refill:  1    Order Specific Question:   Supervising Provider    Answer:   Olin Hauser [[id number]]    Follow up plan: Return in about 6 months (around 04/22/2019) for hypertension, GERD.  A total of 43 minutes was spent face-to-face with this patient. Greater than 50% of this time was spent in counseling and  coordination of care with the patient regarding multiple conditions as outlined above.   Cassell Smiles, DNP, AGPCNP-BC Adult Gerontology Primary Care Nurse Practitioner [facility] 10/20/2018, 1:37 PM

## 2018-10-20 NOTE — Patient Instructions (Addendum)
Noah Velasquez,   Thank you for coming in to clinic today.  1. For pain control: - Tylenol up to 6 pills daily (2 pills three times daily). - May take up to 800 mg ibuprofen (two pills twice daily) several times per week but avoid if you don't need it.  2. Continue all mental health meds today.   START sertraline first for 3-4 days, then resume Wellbutrin.  3. Your right shoulder pain is likely from a partial rotator cuff tear. - START ROM exercises.   - Also mention this to your orthopedic doc.  Call [facility] to discuss scheduling your surgery.  Please schedule a follow-up appointment with Cassell Smiles, AGNP. Return in about 6 months (around 04/22/2019) for hypertension, GERD.  If you have any other questions or concerns, please feel free to call the clinic or send a message through [place]. You may also schedule an earlier appointment if necessary.  You will receive a survey after today's visit either digitally by e-mail or paper by [employer]. Your experiences and feedback matter to [place].  Please respond so we know how we are doing as we provide care for you.   Cassell Smiles, DNP, AGNP-BC Adult Gerontology Nurse Practitioner [facility], [facility]   Shoulder Range of Motion Exercises Shoulder range of motion (ROM) exercises are done to keep the shoulder moving freely or to increase movement. They are often recommended for people who have shoulder pain or stiffness or who are recovering from a shoulder surgery. Phase 1 exercises When you are able, do this exercise 1-2 times per day for 30-60 seconds in each direction, or as directed by your health care provider. Pendulum exercise To do this exercise while sitting: 1. Sit in a chair or at the edge of your bed with your feet flat on the floor. 2. Let your affected arm hang down in front of you over the edge of the bed or chair. 3. Relax your shoulder, arm, and hand. Gallant your body so your arm gently swings in  small circles. You can also use your unaffected arm to start the motion. 5. Repeat changing the direction of the circles, swinging your arm left and right, and swinging your arm forward and back. To do this exercise while standing: 1. Stand next to a sturdy chair or table, and hold on to it with your hand on your unaffected side. 2. Bend forward at the waist. 3. Bend your knees slightly. 4. Relax your shoulder, arm, and hand. 5. While keeping your shoulder relaxed, use body motion to swing your arm in small circles. 6. Repeat changing the direction of the circles, swinging your arm left and right, and swinging your arm forward and back. 7. Between exercises, stand up tall and take a short break to relax your lower back.  Phase 2 exercises Do these exercises 1-2 times per day or as told by your health care provider. Hold each stretch for 30 seconds, and repeat 3 times. Do the exercises with one or both arms as instructed by your health care provider. For these exercises, sit at a table with your hand and arm supported by the table. A chair that slides easily or has wheels can be helpful. External rotation 1. Turn your chair so that your affected side is nearest to the table. 2. Place your forearm on the table to your side. Bend your elbow about 90 at the elbow (right angle) and place your hand palm facing down on the  table. Your elbow should be about 6 inches away from your side. 3. Keeping your arm on the table, lean your body forward. Abduction 1. Turn your chair so that your affected side is nearest to the table. 2. Place your forearm and hand on the table so that your thumb points toward the ceiling and your arm is straight out to your side. 3. Slide your hand out to the side and away from you, using your unaffected arm to do the work. 4. To increase the stretch, you can slide your chair away from the table. Flexion: forward stretch 1. Sit facing the table. Place your hand and elbow on  the table in front of you. 2. Slide your hand forward and away from you, using your unaffected arm to do the work. 3. To increase the stretch, you can slide your chair backward. Phase 3 exercises Do these exercises 1-2 times per day or as told by your health care provider. Hold each stretch for 30 seconds, and repeat 3 times. Do the exercises with one or both arms as instructed by your health care provider. Cross-body stretch: posterior capsule stretch 1. Lift your arm straight out in front of you. 2. Bend your arm 90 at the elbow (right angle) so your forearm moves across your body. 3. Use your other arm to gently pull the elbow across your body, toward your other shoulder. Wall climbs 1. Stand with your affected arm extended out to the side with your hand resting on a door frame. 2. Slide your hand slowly up the door frame. 3. To increase the stretch, step through the door frame. Keep your body upright and do not lean. Wand exercises You will need a cane, a piece of PVC pipe, or a sturdy wooden dowel for wand exercises. Flexion To do this exercise while standing: 1. Hold the wand with both of your hands, palms down. 2. Using the other arm to help, lift your arms up and over your head, if able. 3. Push upward with your other arm to gently increase the stretch. To do this exercise while lying down: 1. Lie on your back with your elbows resting on the floor and the wand in both your hands. Your hands will be palm down, or pointing toward your feet. 2. Lift your hands toward the ceiling, using your unaffected arm to help if needed. 3. Bring your arms overhead as able, using your unaffected arm to help if needed. Internal rotation 1. Stand while holding the wand behind you with both hands. Your unaffected arm should be extended above your head with the arm of the affected side extended behind you at the level of your waist. The wand should be pointing straight up and down as you hold  it. 2. Slowly pull the wand up behind your back by straightening the elbow of your unaffected arm and bending the elbow of your affected arm. External rotation 1. Lie on your back with your affected upper arm supported on a small pillow or rolled towel. When you first do this exercise, keep your upper arm close to your body. Over time, bring your arm up to a 90 angle out to the side. 2. Hold the wand across your stomach and with both hands palm up. Your elbow on your affected side should be bent at a 90 angle. 3. Use your unaffected side to help push your forearm away from you and toward the floor. Keep your elbow on your affected side bent at a 90  angle. Contact a health care provider if you have:  New or increasing pain.  New numbness, tingling, weakness, or discoloration in your arm or hand. This information is not intended to replace advice given to you by your health care provider. Make sure you discuss any questions you have with your health care provider. Document Released: 05/02/2003 Document Revised: 09/15/2017 Document Reviewed: 09/15/2017 Elsevier Interactive Patient Education  2019 [employer].

## 2018-10-27 ENCOUNTER — Encounter: Payer: Self-pay | Admitting: Nurse Practitioner

## 2018-11-01 ENCOUNTER — Ambulatory Visit: Payer: Self-pay | Admitting: Nurse Practitioner

## 2018-11-01 ENCOUNTER — Ambulatory Visit: Payer: Medicaid Other | Admitting: Nurse Practitioner

## 2018-11-17 DIAGNOSIS — F3110 Bipolar disorder, current episode manic without psychotic features, unspecified: Secondary | ICD-10-CM | POA: Diagnosis not present

## 2018-11-21 ENCOUNTER — Other Ambulatory Visit: Payer: Self-pay | Admitting: Nurse Practitioner

## 2018-11-21 DIAGNOSIS — K219 Gastro-esophageal reflux disease without esophagitis: Secondary | ICD-10-CM

## 2018-11-22 DIAGNOSIS — F3110 Bipolar disorder, current episode manic without psychotic features, unspecified: Secondary | ICD-10-CM | POA: Diagnosis not present

## 2018-11-24 DIAGNOSIS — F3110 Bipolar disorder, current episode manic without psychotic features, unspecified: Secondary | ICD-10-CM | POA: Diagnosis not present

## 2018-11-28 DIAGNOSIS — F3110 Bipolar disorder, current episode manic without psychotic features, unspecified: Secondary | ICD-10-CM | POA: Diagnosis not present

## 2018-11-30 DIAGNOSIS — F3110 Bipolar disorder, current episode manic without psychotic features, unspecified: Secondary | ICD-10-CM | POA: Diagnosis not present

## 2018-12-05 DIAGNOSIS — F3110 Bipolar disorder, current episode manic without psychotic features, unspecified: Secondary | ICD-10-CM | POA: Diagnosis not present

## 2018-12-07 DIAGNOSIS — F3110 Bipolar disorder, current episode manic without psychotic features, unspecified: Secondary | ICD-10-CM | POA: Diagnosis not present

## 2018-12-12 DIAGNOSIS — F3110 Bipolar disorder, current episode manic without psychotic features, unspecified: Secondary | ICD-10-CM | POA: Diagnosis not present

## 2018-12-14 DIAGNOSIS — F3110 Bipolar disorder, current episode manic without psychotic features, unspecified: Secondary | ICD-10-CM | POA: Diagnosis not present

## 2018-12-23 DIAGNOSIS — F3110 Bipolar disorder, current episode manic without psychotic features, unspecified: Secondary | ICD-10-CM | POA: Diagnosis not present

## 2018-12-24 DIAGNOSIS — F3110 Bipolar disorder, current episode manic without psychotic features, unspecified: Secondary | ICD-10-CM | POA: Diagnosis not present

## 2018-12-30 DIAGNOSIS — F3110 Bipolar disorder, current episode manic without psychotic features, unspecified: Secondary | ICD-10-CM | POA: Diagnosis not present

## 2018-12-31 DIAGNOSIS — F3110 Bipolar disorder, current episode manic without psychotic features, unspecified: Secondary | ICD-10-CM | POA: Diagnosis not present

## 2019-01-06 DIAGNOSIS — F3110 Bipolar disorder, current episode manic without psychotic features, unspecified: Secondary | ICD-10-CM | POA: Diagnosis not present

## 2019-01-07 DIAGNOSIS — F3110 Bipolar disorder, current episode manic without psychotic features, unspecified: Secondary | ICD-10-CM | POA: Diagnosis not present

## 2019-01-13 DIAGNOSIS — F3110 Bipolar disorder, current episode manic without psychotic features, unspecified: Secondary | ICD-10-CM | POA: Diagnosis not present

## 2019-01-14 DIAGNOSIS — F3110 Bipolar disorder, current episode manic without psychotic features, unspecified: Secondary | ICD-10-CM | POA: Diagnosis not present

## 2019-01-20 DIAGNOSIS — F3110 Bipolar disorder, current episode manic without psychotic features, unspecified: Secondary | ICD-10-CM | POA: Diagnosis not present

## 2019-01-21 DIAGNOSIS — F3110 Bipolar disorder, current episode manic without psychotic features, unspecified: Secondary | ICD-10-CM | POA: Diagnosis not present

## 2019-01-27 DIAGNOSIS — F3110 Bipolar disorder, current episode manic without psychotic features, unspecified: Secondary | ICD-10-CM | POA: Diagnosis not present

## 2019-01-28 DIAGNOSIS — F3110 Bipolar disorder, current episode manic without psychotic features, unspecified: Secondary | ICD-10-CM | POA: Diagnosis not present

## 2019-02-03 DIAGNOSIS — F3110 Bipolar disorder, current episode manic without psychotic features, unspecified: Secondary | ICD-10-CM | POA: Diagnosis not present

## 2019-02-04 DIAGNOSIS — F3110 Bipolar disorder, current episode manic without psychotic features, unspecified: Secondary | ICD-10-CM | POA: Diagnosis not present

## 2019-02-10 DIAGNOSIS — F3110 Bipolar disorder, current episode manic without psychotic features, unspecified: Secondary | ICD-10-CM | POA: Diagnosis not present

## 2019-02-11 DIAGNOSIS — F3110 Bipolar disorder, current episode manic without psychotic features, unspecified: Secondary | ICD-10-CM | POA: Diagnosis not present

## 2019-02-17 DIAGNOSIS — F3110 Bipolar disorder, current episode manic without psychotic features, unspecified: Secondary | ICD-10-CM | POA: Diagnosis not present

## 2019-02-18 DIAGNOSIS — F3110 Bipolar disorder, current episode manic without psychotic features, unspecified: Secondary | ICD-10-CM | POA: Diagnosis not present

## 2019-02-24 DIAGNOSIS — F3110 Bipolar disorder, current episode manic without psychotic features, unspecified: Secondary | ICD-10-CM | POA: Diagnosis not present

## 2019-02-25 DIAGNOSIS — F3110 Bipolar disorder, current episode manic without psychotic features, unspecified: Secondary | ICD-10-CM | POA: Diagnosis not present

## 2019-03-03 DIAGNOSIS — F3110 Bipolar disorder, current episode manic without psychotic features, unspecified: Secondary | ICD-10-CM | POA: Diagnosis not present

## 2019-03-04 DIAGNOSIS — F3110 Bipolar disorder, current episode manic without psychotic features, unspecified: Secondary | ICD-10-CM | POA: Diagnosis not present

## 2019-03-10 DIAGNOSIS — F3110 Bipolar disorder, current episode manic without psychotic features, unspecified: Secondary | ICD-10-CM | POA: Diagnosis not present

## 2019-03-11 DIAGNOSIS — F3110 Bipolar disorder, current episode manic without psychotic features, unspecified: Secondary | ICD-10-CM | POA: Diagnosis not present

## 2019-03-14 DIAGNOSIS — Z23 Encounter for immunization: Secondary | ICD-10-CM | POA: Diagnosis not present

## 2019-03-17 DIAGNOSIS — F3110 Bipolar disorder, current episode manic without psychotic features, unspecified: Secondary | ICD-10-CM | POA: Diagnosis not present

## 2019-03-18 DIAGNOSIS — F3110 Bipolar disorder, current episode manic without psychotic features, unspecified: Secondary | ICD-10-CM | POA: Diagnosis not present

## 2019-03-24 DIAGNOSIS — F3110 Bipolar disorder, current episode manic without psychotic features, unspecified: Secondary | ICD-10-CM | POA: Diagnosis not present

## 2019-03-25 DIAGNOSIS — F3110 Bipolar disorder, current episode manic without psychotic features, unspecified: Secondary | ICD-10-CM | POA: Diagnosis not present

## 2019-03-31 DIAGNOSIS — F3110 Bipolar disorder, current episode manic without psychotic features, unspecified: Secondary | ICD-10-CM | POA: Diagnosis not present

## 2019-04-01 DIAGNOSIS — F3110 Bipolar disorder, current episode manic without psychotic features, unspecified: Secondary | ICD-10-CM | POA: Diagnosis not present

## 2019-04-05 ENCOUNTER — Inpatient Hospital Stay
Admission: EM | Admit: 2019-04-05 | Discharge: 2019-04-07 | DRG: 440 | Disposition: A | Discharge status: Home / Self Care | Payer: Medicaid Other | Attending: Internal Medicine | Admitting: Internal Medicine

## 2019-04-05 ENCOUNTER — Emergency Department: Payer: Medicaid Other

## 2019-04-05 ENCOUNTER — Other Ambulatory Visit: Payer: Self-pay

## 2019-04-05 DIAGNOSIS — F17210 Nicotine dependence, cigarettes, uncomplicated: Secondary | ICD-10-CM | POA: Diagnosis present

## 2019-04-05 DIAGNOSIS — F4310 Post-traumatic stress disorder, unspecified: Secondary | ICD-10-CM | POA: Diagnosis present

## 2019-04-05 DIAGNOSIS — K859 Acute pancreatitis without necrosis or infection, unspecified: Secondary | ICD-10-CM | POA: Diagnosis not present

## 2019-04-05 DIAGNOSIS — Z833 Family history of diabetes mellitus: Secondary | ICD-10-CM

## 2019-04-05 DIAGNOSIS — Z03818 Encounter for observation for suspected exposure to other biological agents ruled out: Secondary | ICD-10-CM | POA: Diagnosis not present

## 2019-04-05 DIAGNOSIS — F1010 Alcohol abuse, uncomplicated: Secondary | ICD-10-CM | POA: Diagnosis present

## 2019-04-05 DIAGNOSIS — E785 Hyperlipidemia, unspecified: Secondary | ICD-10-CM | POA: Diagnosis present

## 2019-04-05 DIAGNOSIS — I10 Essential (primary) hypertension: Secondary | ICD-10-CM | POA: Diagnosis present

## 2019-04-05 DIAGNOSIS — K860 Alcohol-induced chronic pancreatitis: Secondary | ICD-10-CM | POA: Diagnosis present

## 2019-04-05 DIAGNOSIS — I129 Hypertensive chronic kidney disease with stage 1 through stage 4 chronic kidney disease, or unspecified chronic kidney disease: Secondary | ICD-10-CM | POA: Diagnosis present

## 2019-04-05 DIAGNOSIS — Z7951 Long term (current) use of inhaled steroids: Secondary | ICD-10-CM

## 2019-04-05 DIAGNOSIS — K852 Alcohol induced acute pancreatitis without necrosis or infection: Principal | ICD-10-CM | POA: Diagnosis present

## 2019-04-05 DIAGNOSIS — K861 Other chronic pancreatitis: Secondary | ICD-10-CM

## 2019-04-05 DIAGNOSIS — Z79899 Other long term (current) drug therapy: Secondary | ICD-10-CM

## 2019-04-05 DIAGNOSIS — R112 Nausea with vomiting, unspecified: Secondary | ICD-10-CM | POA: Diagnosis not present

## 2019-04-05 DIAGNOSIS — R52 Pain, unspecified: Secondary | ICD-10-CM | POA: Diagnosis not present

## 2019-04-05 DIAGNOSIS — I152 Hypertension secondary to endocrine disorders: Secondary | ICD-10-CM | POA: Diagnosis present

## 2019-04-05 DIAGNOSIS — R110 Nausea: Secondary | ICD-10-CM | POA: Diagnosis not present

## 2019-04-05 DIAGNOSIS — Z20828 Contact with and (suspected) exposure to other viral communicable diseases: Secondary | ICD-10-CM | POA: Diagnosis present

## 2019-04-05 DIAGNOSIS — R0902 Hypoxemia: Secondary | ICD-10-CM | POA: Diagnosis not present

## 2019-04-05 DIAGNOSIS — K219 Gastro-esophageal reflux disease without esophagitis: Secondary | ICD-10-CM | POA: Diagnosis present

## 2019-04-05 DIAGNOSIS — N183 Chronic kidney disease, stage 3 (moderate): Secondary | ICD-10-CM | POA: Diagnosis present

## 2019-04-05 DIAGNOSIS — F1021 Alcohol dependence, in remission: Secondary | ICD-10-CM | POA: Diagnosis present

## 2019-04-05 LAB — CBC
HCT: 43.3 % (ref 39.0–52.0)
Hemoglobin: 14.7 g/dL (ref 13.0–17.0)
MCH: 31.5 pg (ref 26.0–34.0)
MCHC: 33.9 g/dL (ref 30.0–36.0)
MCV: 92.9 fL (ref 80.0–100.0)
Platelets: 307 10*3/uL (ref 150–400)
RBC: 4.66 MIL/uL (ref 4.22–5.81)
RDW: 13.2 % (ref 11.5–15.5)
WBC: 9.4 10*3/uL (ref 4.0–10.5)
nRBC: 0 % (ref 0.0–0.2)

## 2019-04-05 LAB — URINALYSIS, COMPLETE (UACMP) WITH MICROSCOPIC
Bacteria, UA: NONE SEEN
Bilirubin Urine: NEGATIVE
Glucose, UA: NEGATIVE mg/dL
Hgb urine dipstick: NEGATIVE
Ketones, ur: NEGATIVE mg/dL
Leukocytes,Ua: NEGATIVE
Nitrite: NEGATIVE
Protein, ur: NEGATIVE mg/dL
Specific Gravity, Urine: 1.009 (ref 1.005–1.030)
Squamous Epithelial / HPF: NONE SEEN (ref 0–5)
WBC, UA: NONE SEEN WBC/hpf (ref 0–5)
pH: 6 (ref 5.0–8.0)

## 2019-04-05 LAB — COMPREHENSIVE METABOLIC PANEL
ALT: 18 U/L (ref 0–44)
AST: 23 U/L (ref 15–41)
Albumin: 4.2 g/dL (ref 3.5–5.0)
Alkaline Phosphatase: 89 U/L (ref 38–126)
Anion gap: 7 (ref 5–15)
BUN: 9 mg/dL (ref 6–20)
CO2: 25 mmol/L (ref 22–32)
Calcium: 9.4 mg/dL (ref 8.9–10.3)
Chloride: 102 mmol/L (ref 98–111)
Creatinine, Ser: 1.02 mg/dL (ref 0.61–1.24)
GFR calc Af Amer: >60 mL/min (ref >60)
GFR calc non Af Amer: >60 mL/min (ref >60)
Glucose, Bld: 107 mg/dL — ABNORMAL HIGH (ref 70–99)
Potassium: 3.9 mmol/L (ref 3.5–5.1)
Sodium: 134 mmol/L — ABNORMAL LOW (ref 135–145)
Total Bilirubin: 0.2 mg/dL — ABNORMAL LOW (ref 0.3–1.2)
Total Protein: 9.5 g/dL — ABNORMAL HIGH (ref 6.5–8.1)

## 2019-04-05 LAB — LIPASE, BLOOD: Lipase: 26 U/L (ref 11–51)

## 2019-04-05 LAB — SARS CORONAVIRUS 2 BY RT PCR (HOSPITAL ORDER, PERFORMED IN ~~LOC~~ HOSPITAL LAB): SARS Coronavirus 2: NEGATIVE

## 2019-04-05 LAB — TROPONIN I (HIGH SENSITIVITY)
Troponin I (High Sensitivity): 26 ng/L — ABNORMAL HIGH (ref <18)
Troponin I (High Sensitivity): 20 ng/L — ABNORMAL HIGH (ref <18)

## 2019-04-05 MED ORDER — SODIUM CHLORIDE 0.9% FLUSH: 3.0000 mL | Freq: Once | INTRAVENOUS | Status: DC

## 2019-04-05 MED ORDER — SODIUM CHLORIDE 0.9 % IV SOLN
Freq: Once | INTRAVENOUS | Status: AC
  Administered 2019-04-06: 01:00:00 via INTRAVENOUS

## 2019-04-05 MED ORDER — MORPHINE SULFATE (PF) 4 MG/ML IV SOLN
4.0000 mg | Freq: Once | INTRAVENOUS | Status: AC
  Administered 2019-04-05: 21:00:00 4 mg via INTRAVENOUS
  Filled 2019-04-05: qty 1

## 2019-04-05 MED ORDER — OXYCODONE-ACETAMINOPHEN 5-325 MG PO TABS
1.0000 | ORAL_TABLET | ORAL | Status: DC | PRN
  Administered 2019-04-05: 1 via ORAL
  Filled 2019-04-05: qty 1

## 2019-04-05 MED ORDER — PROMETHAZINE HCL 25 MG/ML IJ SOLN
25.0000 mg | Freq: Once | INTRAMUSCULAR | Status: AC
  Administered 2019-04-05: 22:00:00 25 mg via INTRAVENOUS
  Filled 2019-04-05: qty 1

## 2019-04-05 MED ORDER — IOHEXOL 300 MG/ML  SOLN
100.0000 mL | Freq: Once | INTRAMUSCULAR | Status: AC | PRN
  Administered 2019-04-05: 21:00:00 100 mL via INTRAVENOUS

## 2019-04-05 MED ORDER — SODIUM CHLORIDE 0.9 % IV BOLUS
1000.0000 mL | Freq: Once | INTRAVENOUS | Status: AC
  Administered 2019-04-05: 21:00:00 1000 mL via INTRAVENOUS

## 2019-04-05 NOTE — ED Provider Notes (Signed)
[facility] Emergency Department Provider Note    First MD Initiated Contact with Patient 04/05/19 2047     (approximate)  I have reviewed the triage vital signs and the nursing notes.   HISTORY  Chief Complaint Abdominal Pain   HPI Noah Velasquez is a 52 y.o. male with a history of chronic pancreatitis presents the ER for evaluation of progressively worsening left upper flank pain radiating to his back that he feels consistent with his pancreatitis.  States he has had multiple episodes of nausea vomiting over the past 48 hours.  States his symptoms started on Monday.  Did drink alcohol on Saturday.  Does smoke.  Denies any chest pain or pressure.  No shortness of breath.  No fevers.  Denies any diarrhea or dysuria.    Past Medical History:  Diagnosis Date   Chronic hip pain    Chronic pancreatitis (HCC)    CKD (chronic kidney disease) stage 3, GFR 30-59 ml/min (HCC)    GERD (gastroesophageal reflux disease)    Hyperlipidemia    Hypertension    Osteoarthritis    Pancreatitis    Pancreatitis    PTSD (post-traumatic stress disorder)    Viral infection of left eye    Family History  Problem Relation Age of Onset   Diabetes Mother    Sleep apnea Mother    Emphysema Sister    CAD Neg Hx    Past Surgical History:  Procedure Laterality Date   pancreatic stent placement     Patient Active Problem List   Diagnosis Date Noted   Avascular necrosis of bone of left hip ([place]) 06/20/2018   History of bad fall 06/20/2018   Acute on chronic pancreatitis ([place]) 06/09/2018   Acute on chronic respiratory failure ([place]) 05/06/2018   Acute renal failure (ARF) ([place]) 04/03/2017   Pancreatitis 04/29/2016   Anemia 03/09/2016   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 Authorizing Provider  amLODipine (NORVASC) 10 MG tablet Take 1 tablet (10 mg total) by mouth daily. 10/20/18  Yes Mikey College, NP  buPROPion (WELLBUTRIN XL) 150 MG 24 hr tablet Take 1 tablet (150 mg total) by mouth daily. 10/20/18  Yes Mikey College, NP  famotidine (PEPCID) 20 MG tablet TAKE 1 TABLET BY MOUTH TWICE A DAY 11/21/18  Yes Mikey College, NP  fluticasone [facility]) 50 MCG/ACT nasal spray Place 1 spray into both nostrils daily as needed for allergies or rhinitis. 10/11/18  Yes Mikey College, NP  gabapentin (NEURONTIN) 100 MG capsule Take 1 capsule (100 mg total) by mouth at bedtime as needed (sleep). 05/09/18  Yes Mikey College, NP  loratadine (CLARITIN) 10 MG tablet Take 1 tablet (10 mg total) by mouth daily. 10/11/18  Yes Mikey College, NP  losartan (COZAAR) 100 MG tablet Take 1 tablet (100 mg total) by mouth daily. 10/20/18  Yes Mikey College, NP  sertraline (ZOLOFT) 100 MG tablet Take 1 tablet (100 mg total) by mouth daily. 10/20/18  Yes Mikey College, NP  lipase/protease/amylase (CREON) 36000 UNITS CPEP capsule Take 2 capsules (72,000 Units total) by mouth 3 (three) times daily with meals. Patient not taking: Reported on 04/05/2019 10/20/18   Mikey College, NP  Misc. Devices (ADJUST BATH/SHOWER SEAT/BACK) MISC 1 Device by Does not apply route daily. Patient not taking: Reported on 04/05/2019 05/09/18   Mikey College, NP  nicotine (NICODERM CQ - DOSED IN MG/24 HR) 7 mg/24hr patch Place 1 patch (7 mg total) onto the skin daily. Patient not taking: Reported on 10/20/2018 07/27/18   Mikey College, NP  traZODone (DESYREL) 150 MG tablet Take 1 tablet (150 mg total) by mouth at bedtime as needed for sleep. 10/20/18   Mikey College, NP    Allergies Hctz [hydrochlorothiazide]    Social History Social History    Tobacco Use   Smoking status: Current Every Day Smoker    Packs/day: 0.25    Years: 15.00    Pack years: 3.75    Types: Cigarettes   Smokeless tobacco: Never Used  Substance Use Topics   Alcohol use: Yes    Comment: 80 ounces a week   Drug use: Yes    Types: Marijuana    Review of Systems Patient denies headaches, rhinorrhea, blurry vision, numbness, shortness of breath, chest pain, edema, cough, abdominal pain, nausea, vomiting, diarrhea, dysuria, fevers, rashes or hallucinations unless otherwise stated above in HPI. ____________________________________________   PHYSICAL EXAM:  VITAL SIGNS: Vitals:   04/05/19 2130 04/05/19 2200  BP: (!) 175/105 (!) 131/118  Pulse: 65 80  Resp: 15 18  Temp:    SpO2: 98% 100%    Constitutional: Alert and oriented.  Eyes: Conjunctivae are normal.  Head: Atraumatic. Nose: No congestion/rhinnorhea. Mouth/Throat: Mucous membranes are moist.   Neck: No stridor. Painless ROM.  Cardiovascular: Normal rate, regular rhythm. Grossly normal heart sounds.  Good peripheral circulation. Respiratory: Normal respiratory effort.  No retractions. Lungs CTAB. Gastrointestinal: Soft with luq ttp.  No distention. No abdominal bruits. No CVA tenderness. Genitourinary:  Musculoskeletal: No lower extremity tenderness nor edema.  No joint effusions. Neurologic:  Normal speech and language. No gross focal neurologic deficits are appreciated. No facial droop Skin:  Skin is warm, dry and intact. No rash noted. Psychiatric: Mood and affect are normal. Speech and behavior are normal.  ____________________________________________   LABS (all labs ordered are listed, but only abnormal results are displayed)  Results for orders placed or performed during the hospital encounter of 04/05/19 (from the past 24 hour(s))  Lipase, blood     Status: None   Collection Time: 04/05/19  7:27 PM  Result Value Ref Range   Lipase 26 11 - 51 U/L  Comprehensive  metabolic panel     Status: Abnormal   Collection Time: 04/05/19  7:27 PM  Result Value Ref Range   Sodium 134 (L) 135 - 145 mmol/L   Potassium 3.9 3.5 - 5.1 mmol/L   Chloride 102 98 - 111 mmol/L   CO2 25 22 - 32 mmol/L   Glucose, Bld 107 (H) 70 - 99 mg/dL   BUN 9 6 - 20 mg/dL   Creatinine, Ser 1.02 0.61 - 1.24 mg/dL   Calcium 9.4 8.9 - 10.3 mg/dL   Total Protein 9.5 (H) 6.5 - 8.1 g/dL   Albumin 4.2 3.5 - 5.0 g/dL   AST 23 15 - 41 U/L   ALT 18 0 - 44 U/L   Alkaline Phosphatase 89 38 - 126 U/L   Total Bilirubin 0.2 (L) 0.3 - 1.2 mg/dL   GFR calc non Af Amer >60 >60 mL/min   GFR calc Af Amer >60 >60 mL/min   Anion gap 7 5 - 15  CBC  Status: None   Collection Time: 04/05/19  7:27 PM  Result Value Ref Range   WBC 9.4 4.0 - 10.5 K/uL   RBC 4.66 4.22 - 5.81 MIL/uL   Hemoglobin 14.7 13.0 - 17.0 g/dL   HCT 43.3 39.0 - 52.0 %   MCV 92.9 80.0 - 100.0 fL   MCH 31.5 26.0 - 34.0 pg   MCHC 33.9 30.0 - 36.0 g/dL   RDW 13.2 11.5 - 15.5 %   Platelets 307 150 - 400 K/uL   nRBC 0.0 0.0 - 0.2 %  Urinalysis, Complete w Microscopic     Status: Abnormal   Collection Time: 04/05/19  7:28 PM  Result Value Ref Range   Color, Urine YELLOW (A) YELLOW   APPearance CLEAR (A) CLEAR   Specific Gravity, Urine 1.009 1.005 - 1.030   pH 6.0 5.0 - 8.0   Glucose, UA NEGATIVE NEGATIVE mg/dL   Hgb urine dipstick NEGATIVE NEGATIVE   Bilirubin Urine NEGATIVE NEGATIVE   Ketones, ur NEGATIVE NEGATIVE mg/dL   Protein, ur NEGATIVE NEGATIVE mg/dL   Nitrite NEGATIVE NEGATIVE   Leukocytes,Ua NEGATIVE NEGATIVE   RBC / HPF 0-5 0 - 5 RBC/hpf   WBC, UA NONE SEEN 0 - 5 WBC/hpf   Bacteria, UA NONE SEEN NONE SEEN   Squamous Epithelial / LPF NONE SEEN 0 - 5   Mucus PRESENT   Troponin I (High Sensitivity)     Status: Abnormal   Collection Time: 04/05/19  7:34 PM  Result Value Ref Range   Troponin I (High Sensitivity) 26 (H) <18 ng/L  Troponin I (High Sensitivity)     Status: Abnormal   Collection Time: 04/05/19   8:58 PM  Result Value Ref Range   Troponin I (High Sensitivity) 20 (H) <18 ng/L  SARS Coronavirus 2 [facility] order, Performed in [place] hospital lab) Nasopharyngeal Nasopharyngeal Swab     Status: None   Collection Time: 04/05/19  9:38 PM   Specimen: Nasopharyngeal Swab  Result Value Ref Range   SARS Coronavirus 2 NEGATIVE NEGATIVE   ____________________________________________  EKG My review and personal interpretation at Time: 19:45   Indication: luq pain  Rate: 80  Rhythm: sinus Axis: left Other: rbbb, lafb, no stemi, nonspecific st abn,  ____________________________________________  RADIOLOGY  I personally reviewed all radiographic images ordered to evaluate for the above acute complaints and reviewed radiology reports and findings.  These findings were personally discussed with the patient.  Please see medical record for radiology report.  ____________________________________________   PROCEDURES  Procedure(s) performed:  Procedures    Critical Care performed: no ____________________________________________   INITIAL IMPRESSION / ASSESSMENT AND PLAN / ED COURSE  Pertinent labs & imaging results that were available during my care of the patient were reviewed by me and considered in my medical decision making (see chart for details).   DDX: Pancreatitis, mass, diverticulitis, ACS, pneumonia, dehydration, musculoskeletal strain, gastritis  Noah Velasquez is a 53 y.o. who presents to the ED with symptoms as described above.  Patient states that his symptoms are consistent with his known chronic pancreatitis.  His lipase is not elevated but may have a "burned-out "pancreas.  Troponin was sent and triage per protocol is elevated.  EKG does show bifascicular block but no clear ischemic ST segments.  Will further evaluate with serial enzymes.  Suspect may be secondary to dehydration or recurrent nausea vomiting the site of known pancreatitis.  Have a lower suspicion for  ACS.  Clinical Course as of Aug 19  [place] Apr 05, 2019  2200 CT imaging with evidence of acute on chronic pancreatitis particular at the tail.  Appears to be fairly mild case.  Will evaluate to see the patient is able to tolerate p.o.   [PR]  2259 Patient not tolerating p.o.  Will discuss with hospitalist for observation for pain control.   [PR]    Clinical Course User Index [PR] Merlyn Lot, MD    The patient was evaluated in Emergency Department today for the symptoms described in the history of present illness. He/she was evaluated in the context of the global COVID-19 pandemic, which necessitated consideration that the patient might be at risk for infection with the SARS-CoV-2 virus that causes COVID-19. Institutional protocols and algorithms that pertain to the evaluation of patients at risk for COVID-19 are in a state of rapid change based on information released by regulatory bodies including the CDC and federal and state organizations. These policies and algorithms were followed during the patient's care in the ED.  As part of my medical decision making, I reviewed the following data within the [place] notes reviewed and incorporated, Labs reviewed, notes from prior ED visits and [place] Controlled Substance Database   ____________________________________________   FINAL CLINICAL IMPRESSION(S) / ED DIAGNOSES  Final diagnoses:  Acute on chronic pancreatitis ([place])      NEW MEDICATIONS STARTED DURING THIS VISIT:  New Prescriptions   No medications on file     Note:  This document was prepared using Dragon voice recognition software and may include unintentional dictation errors.    Merlyn Lot, MD 04/05/19 [phone]

## 2019-04-05 NOTE — ED Triage Notes (Signed)
Pt arrives to ED via [facility] from home with c/o LUQ abdominal pain x3 days. Pt reports previous h/x of pancreatitis. Pt reports N/V, but denies diarrhea. Pt also reports subjective low-grade fever yesterday, but did not take actual temp. Pt is A&O, in NAD; RR even, regular, and unlabored.

## 2019-04-05 NOTE — ED Notes (Signed)
Patient provided with a meal tray and ginger ale

## 2019-04-05 NOTE — ED Triage Notes (Signed)
First RN Note: Pt presents to ED via [facility] with c/o pancreas problems that started Monday and has progressively gotten worse, per EMS pt also c/o N/V and low grade fever. Per EMS pt states last time he came and got pain meds and felt better.   98.6 108 CBG 108 162/97 98% RA  10/10 pain

## 2019-04-05 NOTE — ED Notes (Addendum)
Charge nurse notified of pt's troponin; pt to be taken to next available exam room per Dr Quentin Cornwall & 2nd troponin ordered

## 2019-04-06 ENCOUNTER — Other Ambulatory Visit: Payer: Self-pay

## 2019-04-06 DIAGNOSIS — K852 Alcohol induced acute pancreatitis without necrosis or infection: Secondary | ICD-10-CM | POA: Diagnosis not present

## 2019-04-06 DIAGNOSIS — K219 Gastro-esophageal reflux disease without esophagitis: Secondary | ICD-10-CM | POA: Diagnosis present

## 2019-04-06 DIAGNOSIS — Z20828 Contact with and (suspected) exposure to other viral communicable diseases: Secondary | ICD-10-CM | POA: Diagnosis present

## 2019-04-06 DIAGNOSIS — N183 Chronic kidney disease, stage 3 (moderate): Secondary | ICD-10-CM | POA: Diagnosis present

## 2019-04-06 DIAGNOSIS — I129 Hypertensive chronic kidney disease with stage 1 through stage 4 chronic kidney disease, or unspecified chronic kidney disease: Secondary | ICD-10-CM | POA: Diagnosis present

## 2019-04-06 DIAGNOSIS — I10 Essential (primary) hypertension: Secondary | ICD-10-CM | POA: Diagnosis not present

## 2019-04-06 DIAGNOSIS — K859 Acute pancreatitis without necrosis or infection, unspecified: Secondary | ICD-10-CM | POA: Diagnosis not present

## 2019-04-06 DIAGNOSIS — F1010 Alcohol abuse, uncomplicated: Secondary | ICD-10-CM | POA: Diagnosis present

## 2019-04-06 DIAGNOSIS — Z833 Family history of diabetes mellitus: Secondary | ICD-10-CM | POA: Diagnosis not present

## 2019-04-06 DIAGNOSIS — F3110 Bipolar disorder, current episode manic without psychotic features, unspecified: Secondary | ICD-10-CM | POA: Diagnosis not present

## 2019-04-06 DIAGNOSIS — K860 Alcohol-induced chronic pancreatitis: Secondary | ICD-10-CM | POA: Diagnosis present

## 2019-04-06 DIAGNOSIS — F1021 Alcohol dependence, in remission: Secondary | ICD-10-CM | POA: Diagnosis present

## 2019-04-06 DIAGNOSIS — K861 Other chronic pancreatitis: Secondary | ICD-10-CM | POA: Diagnosis not present

## 2019-04-06 DIAGNOSIS — Z03818 Encounter for observation for suspected exposure to other biological agents ruled out: Secondary | ICD-10-CM | POA: Diagnosis not present

## 2019-04-06 DIAGNOSIS — F17210 Nicotine dependence, cigarettes, uncomplicated: Secondary | ICD-10-CM | POA: Diagnosis present

## 2019-04-06 DIAGNOSIS — R109 Unspecified abdominal pain: Secondary | ICD-10-CM | POA: Diagnosis present

## 2019-04-06 DIAGNOSIS — F4310 Post-traumatic stress disorder, unspecified: Secondary | ICD-10-CM | POA: Diagnosis present

## 2019-04-06 DIAGNOSIS — Z7951 Long term (current) use of inhaled steroids: Secondary | ICD-10-CM | POA: Diagnosis not present

## 2019-04-06 DIAGNOSIS — E785 Hyperlipidemia, unspecified: Secondary | ICD-10-CM | POA: Diagnosis present

## 2019-04-06 DIAGNOSIS — Z79899 Other long term (current) drug therapy: Secondary | ICD-10-CM | POA: Diagnosis not present

## 2019-04-06 LAB — COMPREHENSIVE METABOLIC PANEL
ALT: 15 U/L (ref 0–44)
AST: 18 U/L (ref 15–41)
Albumin: 3.3 g/dL — ABNORMAL LOW (ref 3.5–5.0)
Alkaline Phosphatase: 76 U/L (ref 38–126)
Anion gap: 6 (ref 5–15)
BUN: 10 mg/dL (ref 6–20)
CO2: 25 mmol/L (ref 22–32)
Calcium: 8.7 mg/dL — ABNORMAL LOW (ref 8.9–10.3)
Chloride: 105 mmol/L (ref 98–111)
Creatinine, Ser: 1.05 mg/dL (ref 0.61–1.24)
GFR calc Af Amer: >60 mL/min (ref >60)
GFR calc non Af Amer: >60 mL/min (ref >60)
Glucose, Bld: 111 mg/dL — ABNORMAL HIGH (ref 70–99)
Potassium: 3.8 mmol/L (ref 3.5–5.1)
Sodium: 136 mmol/L (ref 135–145)
Total Bilirubin: 0.6 mg/dL (ref 0.3–1.2)
Total Protein: 8.1 g/dL (ref 6.5–8.1)

## 2019-04-06 LAB — CBC
HCT: 40.4 % (ref 39.0–52.0)
Hemoglobin: 13.4 g/dL (ref 13.0–17.0)
MCH: 32 pg (ref 26.0–34.0)
MCHC: 33.2 g/dL (ref 30.0–36.0)
MCV: 96.4 fL (ref 80.0–100.0)
Platelets: 263 10*3/uL (ref 150–400)
RBC: 4.19 MIL/uL — ABNORMAL LOW (ref 4.22–5.81)
RDW: 13.6 % (ref 11.5–15.5)
WBC: 9.1 10*3/uL (ref 4.0–10.5)
nRBC: 0 % (ref 0.0–0.2)

## 2019-04-06 LAB — LIPASE, BLOOD: Lipase: 25 U/L (ref 11–51)

## 2019-04-06 MED ORDER — FOLIC ACID 1 MG PO TABS
1.0000 mg | ORAL_TABLET | Freq: Every day | ORAL | Status: DC
  Administered 2019-04-06 – 2019-04-07 (×2): 1 mg via ORAL
  Filled 2019-04-06 (×2): qty 1

## 2019-04-06 MED ORDER — ONDANSETRON HCL 4 MG/2ML IJ SOLN
4.0000 mg | Freq: Four times a day (QID) | INTRAMUSCULAR | Status: DC | PRN
  Filled 2019-04-06: qty 2

## 2019-04-06 MED ORDER — ENOXAPARIN SODIUM 40 MG/0.4ML ~~LOC~~ SOLN
40.0000 mg | Freq: Every day | SUBCUTANEOUS | Status: DC
  Administered 2019-04-06 (×2): 40 mg via SUBCUTANEOUS
  Filled 2019-04-06 (×2): qty 0.4

## 2019-04-06 MED ORDER — OXYCODONE HCL 5 MG PO TABS
5.0000 mg | ORAL_TABLET | ORAL | Status: DC | PRN
  Administered 2019-04-06 – 2019-04-07 (×4): 5 mg via ORAL
  Filled 2019-04-06 (×5): qty 1

## 2019-04-06 MED ORDER — BUPROPION HCL ER (XL) 150 MG PO TB24
150.0000 mg | ORAL_TABLET | Freq: Every day | ORAL | Status: DC
  Administered 2019-04-06 – 2019-04-07 (×2): 150 mg via ORAL
  Filled 2019-04-06 (×2): qty 1

## 2019-04-06 MED ORDER — LORAZEPAM 1 MG PO TABS: 1.0000 mg | ORAL_TABLET | Freq: Four times a day (QID) | ORAL | Status: DC | PRN

## 2019-04-06 MED ORDER — AMLODIPINE BESYLATE 10 MG PO TABS
10.0000 mg | ORAL_TABLET | Freq: Every day | ORAL | Status: DC
  Administered 2019-04-06 – 2019-04-07 (×2): 10 mg via ORAL
  Filled 2019-04-06 (×2): qty 1

## 2019-04-06 MED ORDER — FAMOTIDINE 20 MG PO TABS
20.0000 mg | ORAL_TABLET | Freq: Two times a day (BID) | ORAL | Status: DC
  Administered 2019-04-06 – 2019-04-07 (×3): 20 mg via ORAL
  Filled 2019-04-06 (×3): qty 1

## 2019-04-06 MED ORDER — ADULT MULTIVITAMIN W/MINERALS CH
1.0000 | ORAL_TABLET | Freq: Every day | ORAL | Status: DC
  Administered 2019-04-06 – 2019-04-07 (×2): 1 via ORAL
  Filled 2019-04-06 (×2): qty 1

## 2019-04-06 MED ORDER — SODIUM CHLORIDE 0.9 % IV SOLN
INTRAVENOUS | Status: AC
  Administered 2019-04-06: 11:00:00 via INTRAVENOUS

## 2019-04-06 MED ORDER — NICOTINE 14 MG/24HR TD PT24
14.0000 mg | MEDICATED_PATCH | Freq: Every day | TRANSDERMAL | Status: DC
  Administered 2019-04-06 – 2019-04-07 (×2): 14 mg via TRANSDERMAL
  Filled 2019-04-06 (×2): qty 1

## 2019-04-06 MED ORDER — ACETAMINOPHEN 650 MG RE SUPP: 650.0000 mg | Freq: Four times a day (QID) | RECTAL | Status: DC | PRN

## 2019-04-06 MED ORDER — LORAZEPAM 2 MG/ML IJ SOLN: 1.0000 mg | Freq: Four times a day (QID) | INTRAMUSCULAR | Status: DC | PRN

## 2019-04-06 MED ORDER — MORPHINE SULFATE (PF) 4 MG/ML IV SOLN
4.0000 mg | INTRAVENOUS | Status: DC | PRN
  Administered 2019-04-06 (×3): 4 mg via INTRAVENOUS
  Filled 2019-04-06 (×3): qty 1

## 2019-04-06 MED ORDER — TRAZODONE HCL 50 MG PO TABS
150.0000 mg | ORAL_TABLET | Freq: Every evening | ORAL | Status: DC | PRN
  Administered 2019-04-06 (×2): 150 mg via ORAL
  Filled 2019-04-06 (×2): qty 1

## 2019-04-06 MED ORDER — THIAMINE HCL 100 MG/ML IJ SOLN: 100.0000 mg | Freq: Every day | INTRAMUSCULAR | Status: DC

## 2019-04-06 MED ORDER — SERTRALINE HCL 50 MG PO TABS
100.0000 mg | ORAL_TABLET | Freq: Every day | ORAL | Status: DC
  Administered 2019-04-06 – 2019-04-07 (×2): 100 mg via ORAL
  Filled 2019-04-06 (×2): qty 2

## 2019-04-06 MED ORDER — ONDANSETRON HCL 4 MG PO TABS: 4.0000 mg | ORAL_TABLET | Freq: Four times a day (QID) | ORAL | Status: DC | PRN

## 2019-04-06 MED ORDER — MORPHINE SULFATE (PF) 2 MG/ML IV SOLN
2.0000 mg | INTRAVENOUS | Status: DC | PRN
  Administered 2019-04-06 – 2019-04-07 (×3): 2 mg via INTRAVENOUS
  Filled 2019-04-06 (×3): qty 1

## 2019-04-06 MED ORDER — VITAMIN B-1 100 MG PO TABS
100.0000 mg | ORAL_TABLET | Freq: Every day | ORAL | Status: DC
  Administered 2019-04-06 – 2019-04-07 (×2): 100 mg via ORAL
  Filled 2019-04-06 (×2): qty 1

## 2019-04-06 MED ORDER — LOSARTAN POTASSIUM 50 MG PO TABS
100.0000 mg | ORAL_TABLET | Freq: Every day | ORAL | Status: DC
  Administered 2019-04-06 – 2019-04-07 (×2): 100 mg via ORAL
  Filled 2019-04-06 (×2): qty 2

## 2019-04-06 MED ORDER — ACETAMINOPHEN 325 MG PO TABS: 650.0000 mg | ORAL_TABLET | Freq: Four times a day (QID) | ORAL | Status: DC | PRN

## 2019-04-06 MED ORDER — SODIUM CHLORIDE 0.9 % IV SOLN
INTRAVENOUS | Status: DC
  Administered 2019-04-06: 21:00:00 via INTRAVENOUS

## 2019-04-06 NOTE — Progress Notes (Signed)
[place] [facility] [place] NAME: Noah Velasquez    MR#:  [id number]  DATE OF BIRTH:  1966/06/06  SUBJECTIVE:  CHIEF COMPLAINT:   Chief Complaint  Patient presents with  . Abdominal Pain   No new complaint this morning.  No nausea vomiting or abdominal pains.  Started on clear liquid diet in the morning with plans to gradually advance as tolerated   REVIEW OF SYSTEMS:  Review of Systems  Constitutional: Negative for chills and fever.  HENT: Negative for hearing loss and tinnitus.   Eyes: Negative for blurred vision and double vision.  Respiratory: Negative for cough and sputum production.   Cardiovascular: Negative for chest pain and palpitations.  Gastrointestinal:       Nausea vomiting and abdominal pains improved  Genitourinary: Negative for dysuria and urgency.  Musculoskeletal: Negative for myalgias and neck pain.  Skin: Negative for itching and rash.  Neurological: Negative for dizziness and headaches.  Psychiatric/Behavioral: Negative for depression and hallucinations.    DRUG ALLERGIES:   Allergies  Allergen Reactions  . Hctz [Hydrochlorothiazide] Swelling   VITALS:  Blood pressure 137/81, pulse 80, temperature 99 F (37.2 C), temperature source Oral, resp. rate 18, height 5\' 6"  (1.676 m), weight 77.8 kg, SpO2 94 %. PHYSICAL EXAMINATION:   Physical Exam  Constitutional: He is oriented to person, place, and time. He appears well-developed.  HENT:  Head: Normocephalic and atraumatic.  Right Ear: External ear normal.  Eyes: Pupils are equal, round, and reactive to light. Conjunctivae are normal.  Neck: Normal range of motion. Neck supple. No thyromegaly present.  Cardiovascular: Normal rate, regular rhythm and normal heart sounds.  Respiratory: Effort normal and breath sounds normal. He has no wheezes.  GI: Soft. Bowel sounds are normal. He exhibits no distension.  Musculoskeletal: Normal range of motion.        General:  No edema.  Neurological: He is alert and oriented to person, place, and time.  Skin: Skin is warm. He is not diaphoretic. No erythema.   LABORATORY PANEL:  Male CBC Recent Labs  Lab 04/06/19 0442  WBC 9.1  HGB 13.4  HCT 40.4  PLT 263   ------------------------------------------------------------------------------------------------------------------ Chemistries  Recent Labs  Lab 04/06/19 0442  NA 136  K 3.8  CL 105  CO2 25  GLUCOSE 111*  BUN 10  CREATININE 1.05  CALCIUM 8.7*  AST 18  ALT 15  ALKPHOS 76  BILITOT 0.6   RADIOLOGY:  Ct Abdomen Pelvis W Contrast  Result Date: 04/06/2019 CLINICAL DATA:  Abdominal pain.  History pancreatitis. EXAM: CT ABDOMEN AND PELVIS WITH CONTRAST TECHNIQUE: Multidetector CT imaging of the abdomen and pelvis was performed using the standard protocol following bolus administration of intravenous contrast. CONTRAST:  136mL OMNIPAQUE IOHEXOL 300 MG/ML  SOLN COMPARISON:  CT 06/09/2018 FINDINGS: Lower chest: Lung bases are clear. Hepatobiliary: No focal hepatic lesion. No biliary duct dilatation. Gallbladder is normal. Common bile duct is normal. Pancreas: There are multiple pancreatic calcifications involving the pancreatic parenchyma and within the pancreatic duct. This pattern of calcificationis unchanged from prior. There is cystic lesion the tail the pancreas which is also chronic measuring 2 cm. Adjacent to the tail the pancreas there is stranding within the retroperitoneal fat. This is mild inflammation without organized fluid collections (image 24/2). Spleen: Normal spleen Adrenals/urinary tract: Small nodule of the LEFT adrenal gland unchanged. This cannot be fully characterized on current series. Rigth Adrenal gland normal. Mild cortical scarring LEFT and RIGHT  kidney. Ureters and bladder normal Stomach/Bowel: Thickening through the GE junction suggest combination varices and small lymph nodes. Collaterals. Stomach, small-bowel appendix cecum  normal. The colon rectosigmoid colon normal. Vascular/Lymphatic: Abdominal aorta is normal caliber with atherosclerotic calcification. There is no retroperitoneal or periportal lymphadenopathy. No pelvic lymphadenopathy. Reproductive: Prostate normal Other: No free fluid. Musculoskeletal: No aggressive osseous lesion. IMPRESSION: 1. Acute on chronic pancreatitis. Focal pancreatitis involving the tail of the pancreas with mild inflammation. No organized fluid collections. 2. Extensive pancreatic calcifications consistent with chronic pancreatitis. 3. Mild paraesophageal  venous collaterals and small lymph nodes 4. Stable small LEFT adrenal lesion. Electronically Signed   By: Suzy Bouchard M.D.   On: 04/06/2019 12:22   ASSESSMENT AND PLAN:  1.  Acute on chronic pancreatitis (HCC)  Triggered by alcohol use.   Symptomatic management with fluids and pain control.   Denies any nausea vomiting or abdominal pain at this time.  Started on clear liquid diet in the morning with plans to gradually advance as tolerated   2.  Hypertension  Blood pressure controlled on current regimen.    3. Alcohol abuse  No evidence of alcohol withdrawal at this time.   CIWA protocol.   Thiamine , multivitamin and folic acid.    4.  Tobacco abuse Smoking cessation counseling.  Placed on nicotine patch.  DVT prophylaxis ; Lovenox  All the records are reviewed and case discussed with Care Management/Social Worker. Management plans discussed with the patient, family and they are in agreement.  CODE STATUS: Full Code  TOTAL TIME TAKING CARE OF THIS PATIENT: 33 minutes.   More than 50% of the time was spent in counseling/coordination of care: YES  POSSIBLE D/C IN 2 DAYS, DEPENDING ON CLINICAL CONDITION.   Kimbly Eanes M.D on 04/06/2019 at 3:46 PM  Between 7am to 6pm - Pager - [phone]  After 6pm go to [email or web address] - password [email or web address] [facility]  [employer] [place] [facility]  Office  [phone]  CC:  Primary care physician; Mikey College, NP  Note: This dictation was prepared with Dragon dictation along with smaller phrase technology. Any transcriptional errors that result from this process are unintentional.

## 2019-04-06 NOTE — ED Notes (Signed)
ED TO INPATIENT HANDOFF REPORT  ED Nurse Name and Phone #:  Anson Crofts Name/Age/Gender Noah Velasquez 53 y.o. male Room/Bed: ED03A/ED03A  Code Status   Code Status: Prior  Home/SNF/Other Home Patient oriented to: self, place, time and situation Is this baseline? Yes   Triage Complete: Triage complete  Chief Complaint EMS Pancreas Pain  Triage Note First RN Note: Pt presents to ED via [facility] with c/o pancreas problems that started Monday and has progressively gotten worse, per EMS pt also c/o N/V and low grade fever. Per EMS pt states last time he came and got pain meds and felt better.   98.6 108 CBG 108 162/97 98% RA  10/10 pain  Pt arrives to ED via [facility] from home with c/o LUQ abdominal pain x3 days. Pt reports previous h/x of pancreatitis. Pt reports N/V, but denies diarrhea. Pt also reports subjective low-grade fever yesterday, but did not take actual temp. Pt is A&O, in NAD; RR even, regular, and unlabored.   Allergies Allergies  Allergen Reactions  . Hctz [Hydrochlorothiazide] Swelling    Level of Care/Admitting Diagnosis ED Disposition    ED Disposition Condition [facility]: [place] [[id number]]  Level of Care: Med-Surg [16]  Covid Evaluation: Confirmed COVID Negative  Diagnosis: Pancreatitis [[id number]]  Admitting Physician: Lance Coon [[id number]]  Attending Physician: Lance Coon [id number]  Estimated length of stay: past midnight tomorrow  Certification:: I certify this patient will need inpatient services for at least 2 midnights  PT Class (Do Not Modify): Inpatient [101]  PT Acc Code (Do Not Modify): Private [1]       B Medical/Surgery History Past Medical History:  Diagnosis Date  . Chronic hip pain   . Chronic pancreatitis ([place])   . CKD (chronic kidney disease) stage 3, GFR 30-59 ml/min (HCC)   . GERD (gastroesophageal reflux disease)   . Hyperlipidemia   . Hypertension   . Osteoarthritis   .  Pancreatitis   . Pancreatitis   . PTSD (post-traumatic stress disorder)   . Viral infection of left eye    Past Surgical History:  Procedure Laterality Date  . pancreatic stent placement       A IV Location/Drains/Wounds Patient Lines/Drains/Airways Status   Active Line/Drains/Airways    Name:   Placement date:   Placement time:   Site:   Days:   Peripheral IV 04/05/19 Left Forearm   04/05/19    2057    Forearm   1          Intake/Output Last 24 hours No intake or output data in the 24 hours ending 04/06/19 0029  Labs/Imaging Results for orders placed or performed during the hospital encounter of 04/05/19 (from the past 48 hour(s))  Lipase, blood     Status: None   Collection Time: 04/05/19  7:27 PM  Result Value Ref Range   Lipase 26 11 - 51 U/L    Comment: Performed at [facility], [address] [place], [place] [address]  Comprehensive metabolic panel     Status: Abnormal   Collection Time: 04/05/19  7:27 PM  Result Value Ref Range   Sodium 134 (L) 135 - 145 mmol/L   Potassium 3.9 3.5 - 5.1 mmol/L   Chloride 102 98 - 111 mmol/L   CO2 25 22 - 32 mmol/L   Glucose, Bld 107 (H) 70 - 99 mg/dL   BUN 9 6 - 20 mg/dL   Creatinine, Ser 1.02 0.61 -  1.24 mg/dL   Calcium 9.4 8.9 - 10.3 mg/dL   Total Protein 9.5 (H) 6.5 - 8.1 g/dL   Albumin 4.2 3.5 - 5.0 g/dL   AST 23 15 - 41 U/L   ALT 18 0 - 44 U/L   Alkaline Phosphatase 89 38 - 126 U/L   Total Bilirubin 0.2 (L) 0.3 - 1.2 mg/dL   GFR calc non Af Amer >60 >60 mL/min   GFR calc Af Amer >60 >60 mL/min   Anion gap 7 5 - 15    Comment: Performed at [facility], [address] [place], [place] [address]  CBC     Status: None   Collection Time: 04/05/19  7:27 PM  Result Value Ref Range   WBC 9.4 4.0 - 10.5 K/uL   RBC 4.66 4.22 - 5.81 MIL/uL   Hemoglobin 14.7 13.0 - 17.0 g/dL   HCT 43.3 39.0 - 52.0 %   MCV 92.9 80.0 - 100.0 fL   MCH 31.5 26.0 - 34.0 pg   MCHC 33.9 30.0 - 36.0 g/dL   RDW 13.2 11.5 -  15.5 %   Platelets 307 150 - 400 K/uL   nRBC 0.0 0.0 - 0.2 %    Comment: Performed at [facility], [address] [place], [place] [address]  Urinalysis, Complete w Microscopic     Status: Abnormal   Collection Time: 04/05/19  7:28 PM  Result Value Ref Range   Color, Urine YELLOW (A) YELLOW   APPearance CLEAR (A) CLEAR   Specific Gravity, Urine 1.009 1.005 - 1.030   pH 6.0 5.0 - 8.0   Glucose, UA NEGATIVE NEGATIVE mg/dL   Hgb urine dipstick NEGATIVE NEGATIVE   Bilirubin Urine NEGATIVE NEGATIVE   Ketones, ur NEGATIVE NEGATIVE mg/dL   Protein, ur NEGATIVE NEGATIVE mg/dL   Nitrite NEGATIVE NEGATIVE   Leukocytes,Ua NEGATIVE NEGATIVE   RBC / HPF 0-5 0 - 5 RBC/hpf   WBC, UA NONE SEEN 0 - 5 WBC/hpf   Bacteria, UA NONE SEEN NONE SEEN   Squamous Epithelial / LPF NONE SEEN 0 - 5   Mucus PRESENT     Comment: Performed at [facility], [place], [place] [address]  Troponin I (High Sensitivity)     Status: Abnormal   Collection Time: 04/05/19  7:34 PM  Result Value Ref Range   Troponin I (High Sensitivity) 26 (H) <18 ng/L    Comment: (NOTE) Elevated high sensitivity troponin I (hsTnI) values and significant  changes across serial measurements may suggest ACS but many other  chronic and acute conditions are known to elevate hsTnI results.  Refer to the "Links" section for chest pain algorithms and additional  guidance. Performed at [facility], [place], [place] [address]   Troponin I (High Sensitivity)     Status: Abnormal   Collection Time: 04/05/19  8:58 PM  Result Value Ref Range   Troponin I (High Sensitivity) 20 (H) <18 ng/L    Comment: (NOTE) Elevated high sensitivity troponin I (hsTnI) values and significant  changes across serial measurements may suggest ACS but many other  chronic and acute conditions are known to elevate hsTnI results.  Refer to the "Links" section for chest pain algorithms and additional   guidance. Performed at [facility], [address] [place], [place] [address]   SARS Coronavirus 2 [facility] order, Performed in [facility] hospital lab) Nasopharyngeal Nasopharyngeal Swab     Status: None   Collection  Time: 04/05/19  9:38 PM   Specimen: Nasopharyngeal Swab  Result Value Ref Range   SARS Coronavirus 2 NEGATIVE NEGATIVE    Comment: (NOTE) If result is NEGATIVE SARS-CoV-2 target nucleic acids are NOT DETECTED. The SARS-CoV-2 RNA is generally detectable in upper and lower  respiratory specimens during the acute phase of infection. The lowest  concentration of SARS-CoV-2 viral copies this assay can detect is 250  copies / mL. A negative result does not preclude SARS-CoV-2 infection  and should not be used as the sole basis for treatment or other  patient management decisions.  A negative result may occur with  improper specimen collection / handling, submission of specimen other  than nasopharyngeal swab, presence of viral mutation(s) within the  areas targeted by this assay, and inadequate number of viral copies  (<250 copies / mL). A negative result must be combined with clinical  observations, patient history, and epidemiological information. If result is POSITIVE SARS-CoV-2 target nucleic acids are DETECTED. The SARS-CoV-2 RNA is generally detectable in upper and lower  respiratory specimens dur ing the acute phase of infection.  Positive  results are indicative of active infection with SARS-CoV-2.  Clinical  correlation with patient history and other diagnostic information is  necessary to determine patient infection status.  Positive results do  not rule out bacterial infection or co-infection with other viruses. If result is PRESUMPTIVE POSTIVE SARS-CoV-2 nucleic acids MAY BE PRESENT.   A presumptive positive result was obtained on the submitted specimen  and confirmed on repeat testing.  While 2019 novel coronavirus  (SARS-CoV-2) nucleic acids  may be present in the submitted sample  additional confirmatory testing may be necessary for epidemiological  and / or clinical management purposes  to differentiate between  SARS-CoV-2 and other Sarbecovirus currently known to infect humans.  If clinically indicated additional testing with an alternate test  methodology [phone]) is advised. The SARS-CoV-2 RNA is generally  detectable in upper and lower respiratory sp ecimens during the acute  phase of infection. The expected result is Negative. Fact Sheet for Patients:  StrictlyIdeas.no Fact Sheet for Healthcare Providers: BankingDealers.co.za This test is not yet approved or cleared by the [place] FDA and has been authorized for detection and/or diagnosis of SARS-CoV-2 by FDA under an Emergency Use Authorization (EUA).  This EUA will remain in effect (meaning this test can be used) for the duration of the COVID-19 declaration under Section 564(b)(1) of the Act, 21 U.S.C. section 360bbb-3(b)(1), unless the authorization is terminated or revoked sooner. Performed at [facility], [address] [place],  [address]    No results found.  Pending Labs [employer] (From admission, onward)    Start     Ordered   Signed and Held  HIV antibody (Routine Testing)  Once,   R     Signed and Held   Signed and Held  CBC  (enoxaparin (LOVENOX)    CrCl >/= 30 ml/min)  Once,   R    Comments: Baseline for enoxaparin therapy IF NOT ALREADY DRAWN.  Notify MD if PLT < 100 K.    Signed and Held   Signed and Held  Creatinine, serum  (enoxaparin (LOVENOX)    CrCl >/= 30 ml/min)  Once,   R    Comments: Baseline for enoxaparin therapy IF NOT ALREADY DRAWN.    Signed and Held   Signed and Held  Creatinine, serum  (enoxaparin (LOVENOX)    CrCl >/= 30 ml/min)  Weekly,  R    Comments: while on enoxaparin therapy    Signed and Held   Signed and Held  CBC  Tomorrow morning,   R      Signed and Held   Signed and Held  Comprehensive metabolic panel  Tomorrow morning,   R     Signed and Held   Signed and Held  Lipase, blood  Tomorrow morning,   R     Signed and Held          Vitals/Pain Today's Vitals   04/05/19 2200 04/05/19 2230 04/05/19 2300 04/05/19 2330  BP: (!) 131/118 (!) 156/98 139/84 (!) 140/98  Pulse: 80   77  Resp: 18 (!) 25 14 18   Temp:      TempSrc:      SpO2: 100%   94%  Weight:      Height:      PainSc:        Isolation Precautions No active isolations  Medications Medications  oxyCODONE-acetaminophen (PERCOCET/ROXICET) 5-325 MG per tablet 1 tablet (1 tablet Oral Given 04/05/19 1924)  0.9 %  sodium chloride infusion (has no administration in time range)  promethazine (PHENERGAN) injection 25 mg (25 mg Intravenous Given 04/05/19 2156)  morphine 4 MG/ML injection 4 mg (4 mg Intravenous Given 04/05/19 2101)  sodium chloride 0.9 % bolus 1,000 mL (0 mLs Intravenous Stopped 04/05/19 2156)  iohexol (OMNIPAQUE) 300 MG/ML solution 100 mL (100 mLs Intravenous Contrast Given 04/05/19 2113)    Mobility walks with device Low fall risk   Focused Assessments Pancreatitis    R Recommendations: See Admitting Provider Note  Report given to:   Additional Notes:

## 2019-04-06 NOTE — H&P (Signed)
[place] [facility] [place] NAME: Noah Velasquez    MR#:  [id number]  DATE OF BIRTH:  [birth year]  DATE OF ADMISSION:  04/05/2019  PRIMARY CARE PHYSICIAN: Mikey College, NP   REQUESTING/REFERRING PHYSICIAN: Quentin Cornwall, MD  CHIEF COMPLAINT:   Chief Complaint  Patient presents with  . Abdominal Pain    HISTORY OF PRESENT ILLNESS:  Noah Velasquez  is a 53 y.o. male who presents with chief complaint as above.  Patient presents to the ED with 2 days of increasing abdominal pain with nausea.  He has a history of chronic pancreatitis, and has had bouts of acute on chronic pancreatitis that required hospitalization before.  Work-up here today including imaging is consistent with the same.  Hospitalist called for admission  PAST MEDICAL HISTORY:   Past Medical History:  Diagnosis Date  . Chronic hip pain   . Chronic pancreatitis ([place])   . CKD (chronic kidney disease) stage 3, GFR 30-59 ml/min (HCC)   . GERD (gastroesophageal reflux disease)   . Hyperlipidemia   . Hypertension   . Osteoarthritis   . Pancreatitis   . Pancreatitis   . PTSD (post-traumatic stress disorder)   . Viral infection of left eye      PAST SURGICAL HISTORY:   Past Surgical History:  Procedure Laterality Date  . pancreatic stent placement       SOCIAL HISTORY:   Social History   Tobacco Use  . Smoking status: Current Every Day Smoker    Packs/day: 0.25    Years: 15.00    Pack years: 3.75    Types: Cigarettes  . Smokeless tobacco: Never Used  Substance Use Topics  . Alcohol use: Yes    Comment: 80 ounces a week     FAMILY HISTORY:   Family History  Problem Relation Age of Onset  . Diabetes Mother   . Sleep apnea Mother   . Emphysema Sister   . CAD Neg Hx      DRUG ALLERGIES:   Allergies  Allergen Reactions  . Hctz [Hydrochlorothiazide] Swelling    MEDICATIONS AT HOME:   Prior to Admission medications   Medication Sig Start  Date End Date Taking? Authorizing Provider  amLODipine (NORVASC) 10 MG tablet Take 1 tablet (10 mg total) by mouth daily. 10/20/18  Yes Mikey College, NP  buPROPion (WELLBUTRIN XL) 150 MG 24 hr tablet Take 1 tablet (150 mg total) by mouth daily. 10/20/18  Yes Mikey College, NP  famotidine (PEPCID) 20 MG tablet TAKE 1 TABLET BY MOUTH TWICE A DAY 11/21/18  Yes Mikey College, NP  fluticasone [facility]) 50 MCG/ACT nasal spray Place 1 spray into both nostrils daily as needed for allergies or rhinitis. 10/11/18  Yes Mikey College, NP  gabapentin (NEURONTIN) 100 MG capsule Take 1 capsule (100 mg total) by mouth at bedtime as needed (sleep). 05/09/18  Yes Mikey College, NP  loratadine (CLARITIN) 10 MG tablet Take 1 tablet (10 mg total) by mouth daily. 10/11/18  Yes Mikey College, NP  losartan (COZAAR) 100 MG tablet Take 1 tablet (100 mg total) by mouth daily. 10/20/18  Yes Mikey College, NP  sertraline (ZOLOFT) 100 MG tablet Take 1 tablet (100 mg total) by mouth daily. 10/20/18  Yes Mikey College, NP  lipase/protease/amylase (CREON) 36000 UNITS CPEP capsule Take 2 capsules (72,000 Units total) by mouth 3 (three) times daily with meals. Patient not taking: Reported on 04/05/2019 10/20/18  Mikey College, NP  Misc. Devices (ADJUST BATH/SHOWER SEAT/BACK) MISC 1 Device by Does not apply route daily. Patient not taking: Reported on 04/05/2019 05/09/18   Mikey College, NP  nicotine (NICODERM CQ - DOSED IN MG/24 HR) 7 mg/24hr patch Place 1 patch (7 mg total) onto the skin daily. Patient not taking: Reported on 10/20/2018 07/27/18   Mikey College, NP  traZODone (DESYREL) 150 MG tablet Take 1 tablet (150 mg total) by mouth at bedtime as needed for sleep. 10/20/18   Mikey College, NP    REVIEW OF SYSTEMS:  Review of Systems  Constitutional: Negative for chills, fever, malaise/fatigue and weight loss.  HENT: Negative for ear pain, hearing  loss and tinnitus.   Eyes: Negative for blurred vision, double vision, pain and redness.  Respiratory: Negative for cough, hemoptysis and shortness of breath.   Cardiovascular: Negative for chest pain, palpitations, orthopnea and leg swelling.  Gastrointestinal: Positive for abdominal pain and nausea. Negative for constipation, diarrhea and vomiting.  Genitourinary: Negative for dysuria, frequency and hematuria.  Musculoskeletal: Negative for back pain, joint pain and neck pain.  Skin:       No acne, rash, or lesions  Neurological: Negative for dizziness, tremors, focal weakness and weakness.  Endo/Heme/Allergies: Negative for polydipsia. Does not bruise/bleed easily.  Psychiatric/Behavioral: Negative for depression. The patient is not nervous/anxious and does not have insomnia.      VITAL SIGNS:   Vitals:   04/05/19 2200 04/05/19 2230 04/05/19 2300 04/05/19 2330  BP: (!) 131/118 (!) 156/98 139/84 (!) 140/98  Pulse: 80   77  Resp: 18 (!) 25 14 18   Temp:      TempSrc:      SpO2: 100%   94%  Weight:      Height:       Wt Readings from Last 3 Encounters:  04/05/19 78.9 kg  06/09/18 80.6 kg  05/09/18 80.1 kg    PHYSICAL EXAMINATION:  Physical Exam  Vitals reviewed. Constitutional: He is oriented to person, place, and time. He appears well-developed and well-nourished. No distress.  HENT:  Head: Normocephalic and atraumatic.  Mouth/Throat: Oropharynx is clear and moist.  Eyes: Pupils are equal, round, and reactive to light. Conjunctivae and EOM are normal. No scleral icterus.  Neck: Normal range of motion. Neck supple. No JVD present. No thyromegaly present.  Cardiovascular: Normal rate, regular rhythm and intact distal pulses. Exam reveals no gallop and no friction rub.  No murmur heard. Respiratory: Effort normal and breath sounds normal. No respiratory distress. He has no wheezes. He has no rales.  GI: Soft. Bowel sounds are normal. He exhibits no distension. There is  abdominal tenderness.  Musculoskeletal: Normal range of motion.        General: No edema.     Comments: No arthritis, no gout  Lymphadenopathy:    He has no cervical adenopathy.  Neurological: He is alert and oriented to person, place, and time. No cranial nerve deficit.  No dysarthria, no aphasia  Skin: Skin is warm and dry. No rash noted. No erythema.  Psychiatric: He has a normal mood and affect. His behavior is normal. Judgment and thought content normal.    LABORATORY PANEL:   CBC Recent Labs  Lab 04/05/19 1927  WBC 9.4  HGB 14.7  HCT 43.3  PLT 307   ------------------------------------------------------------------------------------------------------------------  Chemistries  Recent Labs  Lab 04/05/19 1927  NA 134*  K 3.9  CL 102  CO2 25  GLUCOSE 107*  BUN 9  CREATININE 1.02  CALCIUM 9.4  AST 23  ALT 18  ALKPHOS 89  BILITOT 0.2*   ------------------------------------------------------------------------------------------------------------------  Cardiac Enzymes No results for input(s): TROPONINI in the last 168 hours. ------------------------------------------------------------------------------------------------------------------  RADIOLOGY:  No results found.  EKG:   Orders placed or performed during the hospital encounter of 04/05/19  . ED EKG  . ED EKG    IMPRESSION AND PLAN:  Principal Problem:   Acute on chronic pancreatitis (HCC) -symptom control with PRN analgesia and antiemetics.  IV fluids in place.  N.p.o. for now Active Problems:   Essential hypertension -home dose antihypertensives   Alcohol abuse -CIWA protocol   Chart review performed and case discussed with ED provider. Labs, imaging and/or ECG reviewed by provider and discussed with patient/family. Management plans discussed with the patient and/or family.  COVID-19 status: Tested negative     DVT PROPHYLAXIS: SubQ lovenox   GI PROPHYLAXIS:  None  ADMISSION STATUS:  Inpatient     CODE STATUS: Full Code Status History    Date Active Date Inactive Code Status Order ID Comments User Context   06/09/2018 2057 06/12/2018 1644 Full Code [id number]  Henreitta Leber, MD ED   05/06/2018 1330 05/08/2018 1540 Full Code [id number]  Epifanio Lesches, MD ED   03/29/2018 1109 04/04/2018 1616 Full Code [id number]  Dustin Flock, MD Inpatient   04/03/2017 0314 04/04/2017 1741 Full Code [id number]  Saundra Shelling, MD Inpatient   04/29/2016 1122 04/30/2016 1847 Full Code [id number]  Hillary Bow, MD ED   Advance Care Planning Activity      TOTAL TIME TAKING CARE OF THIS PATIENT: 45 minutes.   This patient was evaluated in the context of the global COVID-19 pandemic, which necessitated consideration that the patient might be at risk for infection with the SARS-CoV-2 virus that causes COVID-19. Institutional protocols and algorithms that pertain to the evaluation of patients at risk for COVID-19 are in a state of rapid change based on information released by regulatory bodies including the CDC and federal and state organizations. These policies and algorithms were followed to the best of this provider's knowledge to date during the patient's care at this facility.  Ethlyn Daniels 04/06/2019, 12:23 AM  [facility]  Office  [phone]  CC: Primary care physician; Mikey College, NP  Note:  This document was prepared using Dragon voice recognition software and may include unintentional dictation errors.

## 2019-04-07 ENCOUNTER — Telehealth: Payer: Self-pay | Admitting: Nurse Practitioner

## 2019-04-07 DIAGNOSIS — F3110 Bipolar disorder, current episode manic without psychotic features, unspecified: Secondary | ICD-10-CM | POA: Diagnosis not present

## 2019-04-07 LAB — BASIC METABOLIC PANEL
Anion gap: 4 — ABNORMAL LOW (ref 5–15)
BUN: 6 mg/dL (ref 6–20)
CO2: 23 mmol/L (ref 22–32)
Calcium: 8.3 mg/dL — ABNORMAL LOW (ref 8.9–10.3)
Chloride: 106 mmol/L (ref 98–111)
Creatinine, Ser: 1.03 mg/dL (ref 0.61–1.24)
GFR calc Af Amer: >60 mL/min (ref >60)
GFR calc non Af Amer: >60 mL/min (ref >60)
Glucose, Bld: 93 mg/dL (ref 70–99)
Potassium: 3.8 mmol/L (ref 3.5–5.1)
Sodium: 133 mmol/L — ABNORMAL LOW (ref 135–145)

## 2019-04-07 LAB — MAGNESIUM: Magnesium: 2 mg/dL (ref 1.7–2.4)

## 2019-04-07 LAB — HIV ANTIBODY (ROUTINE TESTING W REFLEX): HIV Screen 4th Generation wRfx: NONREACTIVE

## 2019-04-07 MED ORDER — OXYCODONE HCL 5 MG PO TABS: 5.0000 mg | ORAL_TABLET | ORAL | 0 refills | Status: DC | PRN

## 2019-04-07 MED ORDER — NICOTINE 14 MG/24HR TD PT24: 14.0000 mg | MEDICATED_PATCH | Freq: Every day | TRANSDERMAL | 0 refills | Status: DC

## 2019-04-07 NOTE — Discharge Summary (Signed)
[facility] NAME: Khail Zaman    MR#:  [id number]  DATE OF BIRTH:  [birth year]  DATE OF ADMISSION:  04/05/2019   ADMITTING PHYSICIAN: Lance Coon, MD  DATE OF DISCHARGE: 04/07/2019  PRIMARY CARE PHYSICIAN: Mikey College, NP   ADMISSION DIAGNOSIS:  Acute on chronic pancreatitis ([place]) [K85.90, K86.1] DISCHARGE DIAGNOSIS:  Principal Problem:   Acute on chronic pancreatitis ([place]) Active Problems:   Pancreatitis   Essential hypertension   Alcohol abuse  SECONDARY DIAGNOSIS:   Past Medical History:  Diagnosis Date   Chronic hip pain    Chronic pancreatitis ([place])    CKD (chronic kidney disease) stage 3, GFR 30-59 ml/min (HCC)    GERD (gastroesophageal reflux disease)    Hyperlipidemia    Hypertension    Osteoarthritis    Pancreatitis    Pancreatitis    PTSD (post-traumatic stress disorder)    Viral infection of left eye    HOSPITAL COURSE:  Chief complaint; abdominal pain  History of presenting complaint; Noah Velasquez  is a 53 y.o. male who presents with chief complaint of increasing abdominal pain with nausea.  He has a history of chronic pancreatitis, and has had bouts of acute on chronic pancreatitis that required hospitalization before.  Hospitalist called for admission   Hospital course; 1. Acute on chronic pancreatitis (HCC)  Triggered by alcohol use.    Patient significantly improved with symptomatic management with IV fluids and pain control.  Patient was tolerated diet which was gradually advanced to soft diet.  Denies any more nausea or vomiting or abdominal pains.  No fevers.  Lipase level was unremarkable at 25.  Patient clinically and hemodynamically stable.  Advised to avoid alcohol use to prevent recurrent admissions.  Continue Creon.  Follow-up with primary care physician.  2.  Hypertension  Blood pressure controlled .  Outpatient monitoring by primary care physician.    3.  Alcohol abuse  No evidence of alcohol withdrawal at this time.   CIWA protocol.    Patient was treated with thiamine , multivitamin and folic acid during this admission..    4.  Tobacco abuse Smoking cessation counseling.  Placed on nicotine patch.  Continue the same on discharge   DISCHARGE CONDITIONS:  Stable CONSULTS OBTAINED:   DRUG ALLERGIES:   Allergies  Allergen Reactions   Hctz [Hydrochlorothiazide] Swelling   DISCHARGE MEDICATIONS:   Allergies as of 04/07/2019      Reactions   Hctz [hydrochlorothiazide] Swelling      Medication List    STOP taking these medications   Adjust Bath/Shower Seat/Back Misc   nicotine 7 mg/24hr patch Commonly known as: NICODERM CQ - dosed in mg/24 hr Replaced by: nicotine 14 mg/24hr patch     TAKE these medications   amLODipine 10 MG tablet Commonly known as: NORVASC Take 1 tablet (10 mg total) by mouth daily.   buPROPion 150 MG 24 hr tablet Commonly known as: Wellbutrin XL Take 1 tablet (150 mg total) by mouth daily.   famotidine 20 MG tablet Commonly known as: PEPCID TAKE 1 TABLET BY MOUTH TWICE A DAY   fluticasone 50 MCG/ACT nasal spray Commonly known as: FLONASE Place 1 spray into both nostrils daily as needed for allergies or rhinitis.   gabapentin 100 MG capsule Commonly known as: NEURONTIN Take 1 capsule (100 mg total) by mouth at bedtime as needed (sleep).   lipase/protease/amylase 36000 UNITS Cpep capsule Commonly known as: CREON Take 2 capsules (  72,000 Units total) by mouth 3 (three) times daily with meals.   loratadine 10 MG tablet Commonly known as: CLARITIN Take 1 tablet (10 mg total) by mouth daily.   losartan 100 MG tablet Commonly known as: COZAAR Take 1 tablet (100 mg total) by mouth daily.   nicotine 14 mg/24hr patch Commonly known as: NICODERM CQ - dosed in mg/24 hours Place 1 patch (14 mg total) onto the skin daily. Start taking on: April 08, 2019 Replaces: nicotine 7 mg/24hr patch     oxyCODONE 5 MG immediate release tablet Commonly known as: Oxy IR/ROXICODONE Take 1 tablet (5 mg total) by mouth every 4 (four) hours as needed for severe pain.   sertraline 100 MG tablet Commonly known as: ZOLOFT Take 1 tablet (100 mg total) by mouth daily.   traZODone 150 MG tablet Commonly known as: DESYREL Take 1 tablet (150 mg total) by mouth at bedtime as needed for sleep.        DISCHARGE INSTRUCTIONS:   DIET:  Soft diet consistency. DISCHARGE CONDITION:  Stable ACTIVITY:  Activity as tolerated OXYGEN:  Home Oxygen: No.  Oxygen Delivery: room air DISCHARGE LOCATION:  home   If you experience worsening of your admission symptoms, develop shortness of breath, life threatening emergency, suicidal or homicidal thoughts you must seek medical attention immediately by calling 911 or calling your MD immediately  if symptoms less severe.  You Must read complete instructions/literature along with all the possible adverse reactions/side effects for all the Medicines you take and that have been prescribed to you. Take any new Medicines after you have completely understood and accpet all the possible adverse reactions/side effects.   Please note  You were cared for by a hospitalist during your hospital stay. If you have any questions about your discharge medications or the care you received while you were in the hospital after you are discharged, you can call the unit and asked to speak with the hospitalist on call if the hospitalist that took care of you is not available. Once you are discharged, your primary care physician will handle any further medical issues. Please note that NO REFILLS for any discharge medications will be authorized once you are discharged, as it is imperative that you return to your primary care physician (or establish a relationship with a primary care physician if you do not have one) for your aftercare needs so that they can reassess your need for  medications and monitor your lab values.    On the day of Discharge:  VITAL SIGNS:  Blood pressure 139/85, pulse 78, temperature 98.6 F (37 C), temperature source Oral, resp. rate 20, height 5\' 6"  (1.676 m), weight 77.8 kg, SpO2 97 %. PHYSICAL EXAMINATION:  GENERAL:  53 y.o.-year-old patient lying in the bed with no acute distress.  EYES: Pupils equal, round, reactive to light and accommodation. No scleral icterus. Extraocular muscles intact.  HEENT: Head atraumatic, normocephalic. Oropharynx and nasopharynx clear.  NECK:  Supple, no jugular venous distention. No thyroid enlargement, no tenderness.  LUNGS: Normal breath sounds bilaterally, no wheezing, rales,rhonchi or crepitation. No use of accessory muscles of respiration.  CARDIOVASCULAR: S1, S2 normal. No murmurs, rubs, or gallops.  ABDOMEN: Soft, non-tender, non-distended. Bowel sounds present. No organomegaly or mass.  EXTREMITIES: No pedal edema, cyanosis, or clubbing.  NEUROLOGIC: Cranial nerves II through XII are intact. Muscle strength 5/5 in all extremities. Sensation intact. Gait not checked.  PSYCHIATRIC: The patient is alert and oriented x 3.  SKIN: No obvious  rash, lesion, or ulcer.  DATA REVIEW:   CBC Recent Labs  Lab 04/06/19 0442  WBC 9.1  HGB 13.4  HCT 40.4  PLT 263    Chemistries  Recent Labs  Lab 04/06/19 0442 04/07/19 0611  NA 136 133*  K 3.8 3.8  CL 105 106  CO2 25 23  GLUCOSE 111* 93  BUN 10 6  CREATININE 1.05 1.03  CALCIUM 8.7* 8.3*  MG  --  2.0  AST 18  --   ALT 15  --   ALKPHOS 76  --   BILITOT 0.6  --      Microbiology Results  Results for orders placed or performed during the hospital encounter of 04/05/19  SARS Coronavirus 2 [facility] order, Performed in [facility] hospital lab) Nasopharyngeal Nasopharyngeal Swab     Status: None   Collection Time: 04/05/19  9:38 PM   Specimen: Nasopharyngeal Swab  Result Value Ref Range Status   SARS Coronavirus 2 NEGATIVE NEGATIVE Final     Comment: (NOTE) If result is NEGATIVE SARS-CoV-2 target nucleic acids are NOT DETECTED. The SARS-CoV-2 RNA is generally detectable in upper and lower  respiratory specimens during the acute phase of infection. The lowest  concentration of SARS-CoV-2 viral copies this assay can detect is 250  copies / mL. A negative result does not preclude SARS-CoV-2 infection  and should not be used as the sole basis for treatment or other  patient management decisions.  A negative result may occur with  improper specimen collection / handling, submission of specimen other  than nasopharyngeal swab, presence of viral mutation(s) within the  areas targeted by this assay, and inadequate number of viral copies  (<250 copies / mL). A negative result must be combined with clinical  observations, patient history, and epidemiological information. If result is POSITIVE SARS-CoV-2 target nucleic acids are DETECTED. The SARS-CoV-2 RNA is generally detectable in upper and lower  respiratory specimens dur ing the acute phase of infection.  Positive  results are indicative of active infection with SARS-CoV-2.  Clinical  correlation with patient history and other diagnostic information is  necessary to determine patient infection status.  Positive results do  not rule out bacterial infection or co-infection with other viruses. If result is PRESUMPTIVE POSTIVE SARS-CoV-2 nucleic acids MAY BE PRESENT.   A presumptive positive result was obtained on the submitted specimen  and confirmed on repeat testing.  While 2019 novel coronavirus  (SARS-CoV-2) nucleic acids may be present in the submitted sample  additional confirmatory testing may be necessary for epidemiological  and / or clinical management purposes  to differentiate between  SARS-CoV-2 and other Sarbecovirus currently known to infect humans.  If clinically indicated additional testing with an alternate test  methodology [phone]) is advised. The SARS-CoV-2  RNA is generally  detectable in upper and lower respiratory sp ecimens during the acute  phase of infection. The expected result is Negative. Fact Sheet for Patients:  StrictlyIdeas.no Fact Sheet for Healthcare Providers: BankingDealers.co.za This test is not yet approved or cleared by the [place] FDA and has been authorized for detection and/or diagnosis of SARS-CoV-2 by FDA under an Emergency Use Authorization (EUA).  This EUA will remain in effect (meaning this test can be used) for the duration of the COVID-19 declaration under Section 564(b)(1) of the Act, 21 U.S.C. section 360bbb-3(b)(1), unless the authorization is terminated or revoked sooner. Performed at [facility], [address] [place], [place] [address]     RADIOLOGY:  No results found.   Management plans discussed with the patient, family and they are in agreement.  CODE STATUS: Full Code   TOTAL TIME TAKING CARE OF THIS PATIENT: 34 minutes.    Cairo Lingenfelter M.D on 04/07/2019 at 10:08 AM  Between 7am to 6pm - Pager - [phone]  After 6pm go to [email or web address] - password [email or web address] [facility]  [facility]  Office  [phone]  CC: Primary care physician; Mikey College, NP   Note: This dictation was prepared with Dragon dictation along with smaller phrase technology. Any transcriptional errors that result from this process are unintentional.

## 2019-04-07 NOTE — Telephone Encounter (Signed)
Pt called requesting on Oxycodone called into [facility]

## 2019-04-07 NOTE — Telephone Encounter (Signed)
This was ordered by Dr. Stark Jock when he left the hospital today.  He needs to go to his pharmacy to pick it up and/or drop off his prescription.

## 2019-04-08 DIAGNOSIS — F3110 Bipolar disorder, current episode manic without psychotic features, unspecified: Secondary | ICD-10-CM | POA: Diagnosis not present

## 2019-04-09 ENCOUNTER — Other Ambulatory Visit: Payer: Self-pay | Admitting: Nurse Practitioner

## 2019-04-09 DIAGNOSIS — K860 Alcohol-induced chronic pancreatitis: Secondary | ICD-10-CM

## 2019-04-14 ENCOUNTER — Encounter: Payer: Self-pay | Admitting: Nurse Practitioner

## 2019-04-14 ENCOUNTER — Other Ambulatory Visit: Payer: Self-pay

## 2019-04-14 ENCOUNTER — Ambulatory Visit (INDEPENDENT_AMBULATORY_CARE_PROVIDER_SITE_OTHER): Payer: Medicaid Other | Admitting: Nurse Practitioner

## 2019-04-14 DIAGNOSIS — K859 Acute pancreatitis without necrosis or infection, unspecified: Secondary | ICD-10-CM

## 2019-04-14 DIAGNOSIS — K861 Other chronic pancreatitis: Secondary | ICD-10-CM | POA: Diagnosis not present

## 2019-04-14 DIAGNOSIS — F3110 Bipolar disorder, current episode manic without psychotic features, unspecified: Secondary | ICD-10-CM | POA: Diagnosis not present

## 2019-04-14 DIAGNOSIS — Z09 Encounter for follow-up examination after completed treatment for conditions other than malignant neoplasm: Secondary | ICD-10-CM

## 2019-04-14 DIAGNOSIS — F329 Major depressive disorder, single episode, unspecified: Secondary | ICD-10-CM

## 2019-04-14 DIAGNOSIS — F4310 Post-traumatic stress disorder, unspecified: Secondary | ICD-10-CM

## 2019-04-14 MED ORDER — BUPROPION HCL ER (XL) 150 MG PO TB24: 150.0000 mg | ORAL_TABLET | Freq: Every day | ORAL | 1 refills | Status: DC

## 2019-04-14 MED ORDER — SERTRALINE HCL 100 MG PO TABS: 100.0000 mg | ORAL_TABLET | Freq: Every day | ORAL | 1 refills | Status: DC

## 2019-04-14 NOTE — Progress Notes (Signed)
Telemedicine Encounter: Disclosed to patient at start of encounter that we will provide appropriate telemedicine services.  Patient consents to be treate via phone prior to discussion. - Patient is at his home and is accessed via telephone. - Services are provided by Cassell Smiles from [facility].  Subjective:    Patient ID: Noah Velasquez, male    DOB: 08/30/1965, 53 y.o.   MRN: [id number]  Noah Velasquez is a 53 y.o. male presenting on 04/14/2019 for Hospitalization Follow-up (chronic pancreatitis. Pt requesting PCS forms to be filled out for a home healthcare aid to assist around the house. )  HPI  Hospital/Location: [facility] Date of Admission: 04/05/2019 Date of Discharge: 04/07/2019 Transitions of care telephone call: n/a  Reason for Admission: Abdominal Pain Primary (+Secondary) Diagnosis: Acute on Chronic pancreatitis (Alcoholism, hypertension)  [facility] H&P and Discharge Summary have been reviewed - Patient presents today 7 days after recent hospitalization. Brief summary of recent course, patient had symptoms of abdominal pain for several days, hospitalized, treated with pain med, fluids, and pancreatic enzymes. - Today reports overall has done well after discharge. Symptoms of sharp abdominal pain have resolved.  Only occasionally feeling the pain.  Takes Tylenol some for pain and gets relief.   Continues taking pancreatic enzymes. No changes to dose of these in hospital, but has been on stable dose for years.  - New medications on discharge: oxycodone - Changes to current meds on discharge: STOP nicotine patch  Current alcohol intake: "a couple 40s per week."  No liquor.  Patient has started cutting back about 3 months ago.  Continues occasional beer because he "Can't get the war out of my head."   Bad nightmares and sweats/chills are in spells.  Patient is in a good phase right now and attributes this to why he is able to cut back drinking.  Patient is  trying to get a psychiatrist and therapist - peer counselor is working on this, but never got back to him.  Only had him about 1 month.  Trazodone at bedtime, Wellbutrin and Zoloft during day.  Is veteran, but is unable to obtain VA benefits.    Social History   Tobacco Use  . Smoking status: Current Every Day Smoker    Packs/day: 0.25    Years: 15.00    Pack years: 3.75    Types: Cigarettes  . Smokeless tobacco: Never Used  Substance Use Topics  . Alcohol use: Yes    Comment: 80 ounces a week  . Drug use: Yes    Types: Marijuana    Review of Systems Per HPI unless specifically indicated above     Objective:    There were no vitals taken for this visit.  Wt Readings from Last 3 Encounters:  04/06/19 171 lb 8.3 oz (77.8 kg)  06/09/18 177 lb 11.2 oz (80.6 kg)  05/09/18 176 lb 9.6 oz (80.1 kg)    Physical Exam Patient remotely monitored.  Verbal communication appropriate.  Cognition normal.   Results for orders placed or performed during the hospital encounter of 04/05/19  SARS Coronavirus 2 [facility] order, Performed in [facility] hospital lab) Nasopharyngeal Nasopharyngeal Swab   Specimen: Nasopharyngeal Swab  Result Value Ref Range   SARS Coronavirus 2 NEGATIVE NEGATIVE  Lipase, blood  Result Value Ref Range   Lipase 26 11 - 51 U/L  Comprehensive metabolic panel  Result Value Ref Range   Sodium 134 (L) 135 - 145 mmol/L   Potassium 3.9  3.5 - 5.1 mmol/L   Chloride 102 98 - 111 mmol/L   CO2 25 22 - 32 mmol/L   Glucose, Bld 107 (H) 70 - 99 mg/dL   BUN 9 6 - 20 mg/dL   Creatinine, Ser 1.02 0.61 - 1.24 mg/dL   Calcium 9.4 8.9 - 10.3 mg/dL   Total Protein 9.5 (H) 6.5 - 8.1 g/dL   Albumin 4.2 3.5 - 5.0 g/dL   AST 23 15 - 41 U/L   ALT 18 0 - 44 U/L   Alkaline Phosphatase 89 38 - 126 U/L   Total Bilirubin 0.2 (L) 0.3 - 1.2 mg/dL   GFR calc non Af Amer >60 >60 mL/min   GFR calc Af Amer >60 >60 mL/min   Anion gap 7 5 - 15  CBC  Result Value Ref Range   WBC 9.4 4.0 -  10.5 K/uL   RBC 4.66 4.22 - 5.81 MIL/uL   Hemoglobin 14.7 13.0 - 17.0 g/dL   HCT 43.3 39.0 - 52.0 %   MCV 92.9 80.0 - 100.0 fL   MCH 31.5 26.0 - 34.0 pg   MCHC 33.9 30.0 - 36.0 g/dL   RDW 13.2 11.5 - 15.5 %   Platelets 307 150 - 400 K/uL   nRBC 0.0 0.0 - 0.2 %  Urinalysis, Complete w Microscopic  Result Value Ref Range   Color, Urine YELLOW (A) YELLOW   APPearance CLEAR (A) CLEAR   Specific Gravity, Urine 1.009 1.005 - 1.030   pH 6.0 5.0 - 8.0   Glucose, UA NEGATIVE NEGATIVE mg/dL   Hgb urine dipstick NEGATIVE NEGATIVE   Bilirubin Urine NEGATIVE NEGATIVE   Ketones, ur NEGATIVE NEGATIVE mg/dL   Protein, ur NEGATIVE NEGATIVE mg/dL   Nitrite NEGATIVE NEGATIVE   Leukocytes,Ua NEGATIVE NEGATIVE   RBC / HPF 0-5 0 - 5 RBC/hpf   WBC, UA NONE SEEN 0 - 5 WBC/hpf   Bacteria, UA NONE SEEN NONE SEEN   Squamous Epithelial / LPF NONE SEEN 0 - 5   Mucus PRESENT   HIV antibody (Routine Testing)  Result Value Ref Range   HIV Screen 4th Generation wRfx Non Reactive Non Reactive  CBC  Result Value Ref Range   WBC 9.1 4.0 - 10.5 K/uL   RBC 4.19 (L) 4.22 - 5.81 MIL/uL   Hemoglobin 13.4 13.0 - 17.0 g/dL   HCT 40.4 39.0 - 52.0 %   MCV 96.4 80.0 - 100.0 fL   MCH 32.0 26.0 - 34.0 pg   MCHC 33.2 30.0 - 36.0 g/dL   RDW 13.6 11.5 - 15.5 %   Platelets 263 150 - 400 K/uL   nRBC 0.0 0.0 - 0.2 %  Comprehensive metabolic panel  Result Value Ref Range   Sodium 136 135 - 145 mmol/L   Potassium 3.8 3.5 - 5.1 mmol/L   Chloride 105 98 - 111 mmol/L   CO2 25 22 - 32 mmol/L   Glucose, Bld 111 (H) 70 - 99 mg/dL   BUN 10 6 - 20 mg/dL   Creatinine, Ser 1.05 0.61 - 1.24 mg/dL   Calcium 8.7 (L) 8.9 - 10.3 mg/dL   Total Protein 8.1 6.5 - 8.1 g/dL   Albumin 3.3 (L) 3.5 - 5.0 g/dL   AST 18 15 - 41 U/L   ALT 15 0 - 44 U/L   Alkaline Phosphatase 76 38 - 126 U/L   Total Bilirubin 0.6 0.3 - 1.2 mg/dL   GFR calc non Af Amer >60 >60 mL/min  GFR calc Af Amer >60 >60 mL/min   Anion gap 6 5 - 15  Lipase, blood   Result Value Ref Range   Lipase 25 11 - 51 U/L  Basic metabolic panel  Result Value Ref Range   Sodium 133 (L) 135 - 145 mmol/L   Potassium 3.8 3.5 - 5.1 mmol/L   Chloride 106 98 - 111 mmol/L   CO2 23 22 - 32 mmol/L   Glucose, Bld 93 70 - 99 mg/dL   BUN 6 6 - 20 mg/dL   Creatinine, Ser 1.03 0.61 - 1.24 mg/dL   Calcium 8.3 (L) 8.9 - 10.3 mg/dL   GFR calc non Af Amer >60 >60 mL/min   GFR calc Af Amer >60 >60 mL/min   Anion gap 4 (L) 5 - 15  Magnesium  Result Value Ref Range   Magnesium 2.0 1.7 - 2.4 mg/dL  Troponin I (High Sensitivity)  Result Value Ref Range   Troponin I (High Sensitivity) 26 (H) <18 ng/L  Troponin I (High Sensitivity)  Result Value Ref Range   Troponin I (High Sensitivity) 20 (H) <18 ng/L      Assessment & Plan:   Problem List Items Addressed This Visit      Digestive   Acute on chronic pancreatitis (HCC)     Other   Depression   Relevant Medications   buPROPion (WELLBUTRIN XL) 150 MG 24 hr tablet   sertraline (ZOLOFT) 100 MG tablet   Posttraumatic stress disorder   Relevant Medications   buPROPion (WELLBUTRIN XL) 150 MG 24 hr tablet   sertraline (ZOLOFT) 100 MG tablet    Other Visit Diagnoses    PTSD (post-traumatic stress disorder)    -  Primary   Relevant Medications   buPROPion (WELLBUTRIN XL) 150 MG 24 hr tablet   sertraline (ZOLOFT) 100 MG tablet   Other Relevant Orders   Ambulatory referral to Psychiatry   Hospital discharge follow-up          Acute on chronic pancreatitis now resolved.  Continue pancreatic enzymes.  Encouraged ongoing reduction of alcohol.  Important to control mental health for this to be successful.  FOLLOW-UP 3 months.  Patient is requesting PCS services to help with tasks around his home.  He is limited due to back pain, hip pain and decreased mobility.  PTSD, anxiety and depression Improved currently, but often severe and causes increase in alcohol use for self- medication. - Continue medications without changes.   Refills provided. - Referral to psychiatry placed. - Follow-up 3 months  Meds ordered this encounter  Medications  . buPROPion (WELLBUTRIN XL) 150 MG 24 hr tablet    Sig: Take 1 tablet (150 mg total) by mouth daily.    Dispense:  90 tablet    Refill:  1    Order Specific Question:   Supervising Provider    Answer:   Olin Hauser [[id number]]  . sertraline (ZOLOFT) 100 MG tablet    Sig: Take 1 tablet (100 mg total) by mouth daily.    Dispense:  90 tablet    Refill:  1    Order Specific Question:   Supervising Provider    Answer:   Olin Hauser [[id number]]   - Time spent in direct consultation with patient via telemedicine about above concerns: 12 minutes  Follow up plan: Follow-up 3 months.  Cassell Smiles, DNP, AGPCNP-BC Adult Gerontology Primary Care Nurse Practitioner [facility] 04/14/2019, 11:22 AM

## 2019-04-15 DIAGNOSIS — F3110 Bipolar disorder, current episode manic without psychotic features, unspecified: Secondary | ICD-10-CM | POA: Diagnosis not present

## 2019-04-16 ENCOUNTER — Encounter: Payer: Self-pay | Admitting: Nurse Practitioner

## 2019-04-19 ENCOUNTER — Ambulatory Visit (INDEPENDENT_AMBULATORY_CARE_PROVIDER_SITE_OTHER): Payer: Medicaid Other | Admitting: Nurse Practitioner

## 2019-04-19 ENCOUNTER — Encounter: Payer: Self-pay | Admitting: Nurse Practitioner

## 2019-04-19 ENCOUNTER — Other Ambulatory Visit: Payer: Self-pay

## 2019-04-19 VITALS — BP 129/80 | HR 68 | Ht 66.0 in | Wt 187.4 lb

## 2019-04-19 DIAGNOSIS — M67432 Ganglion, left wrist: Secondary | ICD-10-CM

## 2019-04-19 DIAGNOSIS — M87052 Idiopathic aseptic necrosis of left femur: Secondary | ICD-10-CM

## 2019-04-19 NOTE — Patient Instructions (Addendum)
Noah Velasquez,   Thank you for coming in to clinic today.  1. Continue being as active as able. - Shower chair should be coming.  2. Call [facility] for rescheduling your hip surgery.  3. [facility] will also call about your wrist - this is a ganglion cyst  Please schedule a follow-up appointment with Cassell Smiles, AGNP. Return if symptoms worsen or fail to improve.  If you have any other questions or concerns, please feel free to call the clinic or send a message through [place]. You may also schedule an earlier appointment if necessary.  You will receive a survey after today's visit either digitally by e-mail or paper by [employer]. Your experiences and feedback matter to [place].  Please respond so we know how we are doing as we provide care for you.   Cassell Smiles, DNP, AGNP-BC Adult Gerontology Nurse Practitioner [facility], [facility]   Ganglion Cyst  A ganglion cyst is a non-cancerous, fluid-filled lump that occurs near a joint or tendon. The cyst grows out of a joint or the lining of a tendon. Ganglion cysts most often develop in the hand or wrist, but they can also develop in the shoulder, elbow, hip, knee, ankle, or foot. Ganglion cysts are ball-shaped or egg-shaped. Their size can range from the size of a pea to larger than a grape. Increased activity may cause the cyst to get bigger because more fluid starts to build up. What are the causes? The exact cause of this condition is not known, but it may be related to:  Inflammation or irritation around the joint.  An injury.  Repetitive movements or overuse.  Arthritis. What increases the risk? You are more likely to develop this condition if:  You are a woman.  You are 32-26 years old. What are the signs or symptoms? The main symptom of this condition is a lump. It most often appears on the hand or wrist. In many cases, there are no other symptoms, but a cyst can sometimes  cause:  Tingling.  Pain.  Numbness.  Muscle weakness.  Weak grip.  Less range of motion in a joint. How is this diagnosed? Ganglion cysts are usually diagnosed based on a physical exam. Your health care provider will feel the lump and may shine a light next to it. If it is a ganglion cyst, the light will likely shine through it. Your health care provider may order an X-ray, ultrasound, or MRI to rule out other conditions. How is this treated? Ganglion cysts often go away on their own without treatment. If you have pain or other symptoms, treatment may be needed. Treatment is also needed if the ganglion cyst limits your movement or if it gets infected. Treatment may include:  Wearing a brace or splint on your wrist or finger.  Taking anti-inflammatory medicine.  Having fluid drained from the lump with a needle (aspiration).  Getting a steroid injected into the joint.  Having surgery to remove the ganglion cyst.  Placing a pad on your shoe or wearing shoes that will not rub against the cyst if it is on your foot. Follow these instructions at home:  Do not press on the ganglion cyst, poke it with a needle, or hit it.  Take over-the-counter and prescription medicines only as told by your health care provider.  If you have a brace or splint: ? Wear it as told by your health care provider. ? Remove it as told by your health care  provider. Ask if you need to remove it when you take a shower or a bath.  Watch your ganglion cyst for any changes.  Keep all follow-up visits as told by your health care provider. This is important. Contact a health care provider if:  Your ganglion cyst becomes larger or more painful.  You have pus coming from the lump.  You have weakness or numbness in the affected area.  You have a fever or chills. Get help right away if:  You have a fever and have any of these in the cyst area: ? Increased redness. ? Red  streaks. ? Swelling. Summary  A ganglion cyst is a non-cancerous, fluid-filled lump that occurs near a joint or tendon.  Ganglion cysts most often develop in the hand or wrist, but they can also develop in the shoulder, elbow, hip, knee, ankle, or foot.  Ganglion cysts often go away on their own without treatment. This information is not intended to replace advice given to you by your health care provider. Make sure you discuss any questions you have with your health care provider. Document Released: 07/31/2000 Document Revised: 07/16/2017 Document Reviewed: 04/02/2017 Elsevier Patient Education  2020 [employer].

## 2019-04-19 NOTE — Progress Notes (Signed)
Subjective:    Patient ID: Noah Velasquez, male    DOB: [birth year], 53 y.o.   MRN: [id number]  Noah Velasquez is a 53 y.o. male presenting on 04/19/2019 for Consult (PCS need for home health to assisted with daily activity around the house, due to his inability with his chronic right hip issue ) and Cyst (cyst on left wrist that start off about 2 cm and now increased to about 4 cm in size. He denies pain or discomfort.)   HPI Ganglion Cyst - LEFT wrist.  No previous pain or limitations to activities.  Is noting cyst is getting larger and wants evaluation.  PCS form Difficulty getting into/out of shower, getting dressed. Patient also notes difficulty cleaning his home. - Needs a new shower chair due to leg weakness, fall history.  Patient fell backward onto shower chair in past and broke his chair.   Social History   Tobacco Use  . Smoking status: Current Every Day Smoker    Packs/day: 0.25    Years: 15.00    Pack years: 3.75    Types: Cigarettes  . Smokeless tobacco: Never Used  Substance Use Topics  . Alcohol use: Yes    Comment: 80 ounces a week  . Drug use: Yes    Types: Marijuana    Review of Systems Per HPI unless specifically indicated above     Objective:    BP 129/80 (BP Location: Right Arm, Patient Position: Sitting, Cuff Size: Normal)   Pulse 68   Ht 5\' 6"  (1.676 m)   Wt 187 lb 6.4 oz (85 kg)   BMI 30.25 kg/m   Wt Readings from Last 3 Encounters:  04/19/19 187 lb 6.4 oz (85 kg)  04/06/19 171 lb 8.3 oz (77.8 kg)  06/09/18 177 lb 11.2 oz (80.6 kg)    Physical Exam Vitals signs reviewed.  Constitutional:      General: He is not in acute distress.    Appearance: He is well-developed.  HENT:     Head: Normocephalic and atraumatic.  Cardiovascular:     Rate and Rhythm: Normal rate and regular rhythm.     Pulses:          Radial pulses are 2+ on the right side and 2+ on the left side.       Posterior tibial pulses are 1+ on the right side and 1+ on the  left side.     Heart sounds: Normal heart sounds, S1 normal and S2 normal.  Pulmonary:     Effort: Pulmonary effort is normal. No respiratory distress.     Breath sounds: Normal breath sounds and air entry.  Musculoskeletal:     Left wrist: He exhibits decreased range of motion (decreased wrist flexion, limited by cyst and pain) and deformity (large ganglion cyst noted at lateral wrist (radial) not involving thumb). He exhibits no tenderness and no bony tenderness.     Right hip: He exhibits decreased strength (3/5), tenderness (lateral hip very tender to palpation) and crepitus. He exhibits no swelling, no deformity and no laceration. Decreased range of motion: Pt w significantly decreased AROM, PROM w inability to fully flex leg, abduct, adduct,.  Extension normal.     Right knee: Normal.     Right lower leg: No edema.     Left lower leg: No edema.  Skin:    General: Skin is warm and dry.     Capillary Refill: Capillary refill takes less than 2 seconds.  Neurological:  Mental Status: He is alert and oriented to person, place, and time.  Psychiatric:        Attention and Perception: Attention normal.        Mood and Affect: Mood and affect normal.        Behavior: Behavior normal. Behavior is cooperative.      Results for orders placed or performed during the hospital encounter of 04/05/19  SARS Coronavirus 2 [facility] order, Performed in [facility] hospital lab) Nasopharyngeal Nasopharyngeal Swab   Specimen: Nasopharyngeal Swab  Result Value Ref Range   SARS Coronavirus 2 NEGATIVE NEGATIVE  Lipase, blood  Result Value Ref Range   Lipase 26 11 - 51 U/L  Comprehensive metabolic panel  Result Value Ref Range   Sodium 134 (L) 135 - 145 mmol/L   Potassium 3.9 3.5 - 5.1 mmol/L   Chloride 102 98 - 111 mmol/L   CO2 25 22 - 32 mmol/L   Glucose, Bld 107 (H) 70 - 99 mg/dL   BUN 9 6 - 20 mg/dL   Creatinine, Ser 1.02 0.61 - 1.24 mg/dL   Calcium 9.4 8.9 - 10.3 mg/dL   Total Protein  9.5 (H) 6.5 - 8.1 g/dL   Albumin 4.2 3.5 - 5.0 g/dL   AST 23 15 - 41 U/L   ALT 18 0 - 44 U/L   Alkaline Phosphatase 89 38 - 126 U/L   Total Bilirubin 0.2 (L) 0.3 - 1.2 mg/dL   GFR calc non Af Amer >60 >60 mL/min   GFR calc Af Amer >60 >60 mL/min   Anion gap 7 5 - 15  CBC  Result Value Ref Range   WBC 9.4 4.0 - 10.5 K/uL   RBC 4.66 4.22 - 5.81 MIL/uL   Hemoglobin 14.7 13.0 - 17.0 g/dL   HCT 43.3 39.0 - 52.0 %   MCV 92.9 80.0 - 100.0 fL   MCH 31.5 26.0 - 34.0 pg   MCHC 33.9 30.0 - 36.0 g/dL   RDW 13.2 11.5 - 15.5 %   Platelets 307 150 - 400 K/uL   nRBC 0.0 0.0 - 0.2 %  Urinalysis, Complete w Microscopic  Result Value Ref Range   Color, Urine YELLOW (A) YELLOW   APPearance CLEAR (A) CLEAR   Specific Gravity, Urine 1.009 1.005 - 1.030   pH 6.0 5.0 - 8.0   Glucose, UA NEGATIVE NEGATIVE mg/dL   Hgb urine dipstick NEGATIVE NEGATIVE   Bilirubin Urine NEGATIVE NEGATIVE   Ketones, ur NEGATIVE NEGATIVE mg/dL   Protein, ur NEGATIVE NEGATIVE mg/dL   Nitrite NEGATIVE NEGATIVE   Leukocytes,Ua NEGATIVE NEGATIVE   RBC / HPF 0-5 0 - 5 RBC/hpf   WBC, UA NONE SEEN 0 - 5 WBC/hpf   Bacteria, UA NONE SEEN NONE SEEN   Squamous Epithelial / LPF NONE SEEN 0 - 5   Mucus PRESENT   HIV antibody (Routine Testing)  Result Value Ref Range   HIV Screen 4th Generation wRfx Non Reactive Non Reactive  CBC  Result Value Ref Range   WBC 9.1 4.0 - 10.5 K/uL   RBC 4.19 (L) 4.22 - 5.81 MIL/uL   Hemoglobin 13.4 13.0 - 17.0 g/dL   HCT 40.4 39.0 - 52.0 %   MCV 96.4 80.0 - 100.0 fL   MCH 32.0 26.0 - 34.0 pg   MCHC 33.2 30.0 - 36.0 g/dL   RDW 13.6 11.5 - 15.5 %   Platelets 263 150 - 400 K/uL   nRBC 0.0 0.0 - 0.2 %  Comprehensive metabolic panel  Result Value Ref Range   Sodium 136 135 - 145 mmol/L   Potassium 3.8 3.5 - 5.1 mmol/L   Chloride 105 98 - 111 mmol/L   CO2 25 22 - 32 mmol/L   Glucose, Bld 111 (H) 70 - 99 mg/dL   BUN 10 6 - 20 mg/dL   Creatinine, Ser 1.05 0.61 - 1.24 mg/dL   Calcium 8.7 (L)  8.9 - 10.3 mg/dL   Total Protein 8.1 6.5 - 8.1 g/dL   Albumin 3.3 (L) 3.5 - 5.0 g/dL   AST 18 15 - 41 U/L   ALT 15 0 - 44 U/L   Alkaline Phosphatase 76 38 - 126 U/L   Total Bilirubin 0.6 0.3 - 1.2 mg/dL   GFR calc non Af Amer >60 >60 mL/min   GFR calc Af Amer >60 >60 mL/min   Anion gap 6 5 - 15  Lipase, blood  Result Value Ref Range   Lipase 25 11 - 51 U/L  Basic metabolic panel  Result Value Ref Range   Sodium 133 (L) 135 - 145 mmol/L   Potassium 3.8 3.5 - 5.1 mmol/L   Chloride 106 98 - 111 mmol/L   CO2 23 22 - 32 mmol/L   Glucose, Bld 93 70 - 99 mg/dL   BUN 6 6 - 20 mg/dL   Creatinine, Ser 1.03 0.61 - 1.24 mg/dL   Calcium 8.3 (L) 8.9 - 10.3 mg/dL   GFR calc non Af Amer >60 >60 mL/min   GFR calc Af Amer >60 >60 mL/min   Anion gap 4 (L) 5 - 15  Magnesium  Result Value Ref Range   Magnesium 2.0 1.7 - 2.4 mg/dL  Troponin I (High Sensitivity)  Result Value Ref Range   Troponin I (High Sensitivity) 26 (H) <18 ng/L  Troponin I (High Sensitivity)  Result Value Ref Range   Troponin I (High Sensitivity) 20 (H) <18 ng/L      Assessment & Plan:   Problem List Items Addressed This Visit      Musculoskeletal and Integument   Avascular necrosis of bone of left hip (HCC) Currently severe, patient of orthopedics who was previously approved and scheduled for hip replacement surgery.  Cancelled due to Covid-19 as it was deemed elective procedure.  Now resuming elective procedures, but patient not reconnected with orthopedics.  Limiting patient's ability to complete ADLs and iADLs  Plan: 1. Instructed patient to call ortho 2. PCS form completed - likely short term assistance may be granted. 3. Follow-up prn.    Other Visit Diagnoses    Ganglion cyst of wrist, left    -  Primary Worsening in size, nonpainful ganglion cyst.  Limiting flexion ROM.  Patient interested in seeking orthopedic opinion.  Referral placed to [facility] for hand specialist evaluation.  Follow-up prn.    Relevant Orders   Ambulatory referral to Orthopedic Surgery       Follow up plan: Return if symptoms worsen or fail to improve.  Cassell Smiles, DNP, AGPCNP-BC Adult Gerontology Primary Care Nurse Practitioner [facility] 04/19/2019, 10:45 AM

## 2019-04-21 DIAGNOSIS — F3110 Bipolar disorder, current episode manic without psychotic features, unspecified: Secondary | ICD-10-CM | POA: Diagnosis not present

## 2019-04-22 ENCOUNTER — Encounter: Payer: Self-pay | Admitting: Nurse Practitioner

## 2019-04-22 DIAGNOSIS — F3110 Bipolar disorder, current episode manic without psychotic features, unspecified: Secondary | ICD-10-CM | POA: Diagnosis not present

## 2019-04-27 DIAGNOSIS — R2232 Localized swelling, mass and lump, left upper limb: Secondary | ICD-10-CM | POA: Diagnosis not present

## 2019-04-27 DIAGNOSIS — Z596 Low income: Secondary | ICD-10-CM | POA: Diagnosis not present

## 2019-04-28 DIAGNOSIS — F3110 Bipolar disorder, current episode manic without psychotic features, unspecified: Secondary | ICD-10-CM | POA: Diagnosis not present

## 2019-04-29 DIAGNOSIS — F3110 Bipolar disorder, current episode manic without psychotic features, unspecified: Secondary | ICD-10-CM | POA: Diagnosis not present

## 2019-05-05 DIAGNOSIS — F3110 Bipolar disorder, current episode manic without psychotic features, unspecified: Secondary | ICD-10-CM | POA: Diagnosis not present

## 2019-05-05 DIAGNOSIS — R2232 Localized swelling, mass and lump, left upper limb: Secondary | ICD-10-CM | POA: Diagnosis not present

## 2019-05-06 DIAGNOSIS — F3110 Bipolar disorder, current episode manic without psychotic features, unspecified: Secondary | ICD-10-CM | POA: Diagnosis not present

## 2019-05-09 DIAGNOSIS — F4310 Post-traumatic stress disorder, unspecified: Secondary | ICD-10-CM | POA: Diagnosis not present

## 2019-05-10 DIAGNOSIS — F4310 Post-traumatic stress disorder, unspecified: Secondary | ICD-10-CM | POA: Diagnosis not present

## 2019-05-11 DIAGNOSIS — F4310 Post-traumatic stress disorder, unspecified: Secondary | ICD-10-CM | POA: Diagnosis not present

## 2019-05-12 ENCOUNTER — Other Ambulatory Visit: Payer: Self-pay | Admitting: Nurse Practitioner

## 2019-05-12 DIAGNOSIS — F3110 Bipolar disorder, current episode manic without psychotic features, unspecified: Secondary | ICD-10-CM | POA: Diagnosis not present

## 2019-05-12 DIAGNOSIS — I10 Essential (primary) hypertension: Secondary | ICD-10-CM

## 2019-05-12 DIAGNOSIS — F4310 Post-traumatic stress disorder, unspecified: Secondary | ICD-10-CM | POA: Diagnosis not present

## 2019-05-13 DIAGNOSIS — F3110 Bipolar disorder, current episode manic without psychotic features, unspecified: Secondary | ICD-10-CM | POA: Diagnosis not present

## 2019-05-15 DIAGNOSIS — F4310 Post-traumatic stress disorder, unspecified: Secondary | ICD-10-CM | POA: Diagnosis not present

## 2019-05-16 DIAGNOSIS — F4310 Post-traumatic stress disorder, unspecified: Secondary | ICD-10-CM | POA: Diagnosis not present

## 2019-05-17 DIAGNOSIS — F4310 Post-traumatic stress disorder, unspecified: Secondary | ICD-10-CM | POA: Diagnosis not present

## 2019-05-18 DIAGNOSIS — F4310 Post-traumatic stress disorder, unspecified: Secondary | ICD-10-CM | POA: Diagnosis not present

## 2019-05-19 DIAGNOSIS — F4310 Post-traumatic stress disorder, unspecified: Secondary | ICD-10-CM | POA: Diagnosis not present

## 2019-05-19 DIAGNOSIS — F3110 Bipolar disorder, current episode manic without psychotic features, unspecified: Secondary | ICD-10-CM | POA: Diagnosis not present

## 2019-05-20 DIAGNOSIS — F3110 Bipolar disorder, current episode manic without psychotic features, unspecified: Secondary | ICD-10-CM | POA: Diagnosis not present

## 2019-05-22 DIAGNOSIS — Z596 Low income: Secondary | ICD-10-CM | POA: Diagnosis not present

## 2019-05-22 DIAGNOSIS — R2232 Localized swelling, mass and lump, left upper limb: Secondary | ICD-10-CM | POA: Diagnosis not present

## 2019-05-22 DIAGNOSIS — F4310 Post-traumatic stress disorder, unspecified: Secondary | ICD-10-CM | POA: Diagnosis not present

## 2019-05-23 DIAGNOSIS — F4310 Post-traumatic stress disorder, unspecified: Secondary | ICD-10-CM | POA: Diagnosis not present

## 2019-05-24 DIAGNOSIS — F4310 Post-traumatic stress disorder, unspecified: Secondary | ICD-10-CM | POA: Diagnosis not present

## 2019-05-25 DIAGNOSIS — F4310 Post-traumatic stress disorder, unspecified: Secondary | ICD-10-CM | POA: Diagnosis not present

## 2019-05-26 DIAGNOSIS — F4310 Post-traumatic stress disorder, unspecified: Secondary | ICD-10-CM | POA: Diagnosis not present

## 2019-05-26 DIAGNOSIS — F3110 Bipolar disorder, current episode manic without psychotic features, unspecified: Secondary | ICD-10-CM | POA: Diagnosis not present

## 2019-05-27 DIAGNOSIS — F3110 Bipolar disorder, current episode manic without psychotic features, unspecified: Secondary | ICD-10-CM | POA: Diagnosis not present

## 2019-05-29 DIAGNOSIS — F4310 Post-traumatic stress disorder, unspecified: Secondary | ICD-10-CM | POA: Diagnosis not present

## 2019-05-30 DIAGNOSIS — F4310 Post-traumatic stress disorder, unspecified: Secondary | ICD-10-CM | POA: Diagnosis not present

## 2019-05-31 DIAGNOSIS — Z23 Encounter for immunization: Secondary | ICD-10-CM | POA: Diagnosis not present

## 2019-05-31 DIAGNOSIS — F4310 Post-traumatic stress disorder, unspecified: Secondary | ICD-10-CM | POA: Diagnosis not present

## 2019-06-01 DIAGNOSIS — F4310 Post-traumatic stress disorder, unspecified: Secondary | ICD-10-CM | POA: Diagnosis not present

## 2019-06-02 DIAGNOSIS — F3110 Bipolar disorder, current episode manic without psychotic features, unspecified: Secondary | ICD-10-CM | POA: Diagnosis not present

## 2019-06-02 DIAGNOSIS — F4310 Post-traumatic stress disorder, unspecified: Secondary | ICD-10-CM | POA: Diagnosis not present

## 2019-06-03 DIAGNOSIS — F3110 Bipolar disorder, current episode manic without psychotic features, unspecified: Secondary | ICD-10-CM | POA: Diagnosis not present

## 2019-06-05 ENCOUNTER — Other Ambulatory Visit: Payer: Self-pay

## 2019-06-05 ENCOUNTER — Encounter: Payer: Self-pay | Admitting: Psychiatry

## 2019-06-05 ENCOUNTER — Ambulatory Visit (INDEPENDENT_AMBULATORY_CARE_PROVIDER_SITE_OTHER): Payer: Medicaid Other | Admitting: Psychiatry

## 2019-06-05 DIAGNOSIS — F3342 Major depressive disorder, recurrent, in full remission: Secondary | ICD-10-CM | POA: Diagnosis not present

## 2019-06-05 DIAGNOSIS — F1210 Cannabis abuse, uncomplicated: Secondary | ICD-10-CM

## 2019-06-05 DIAGNOSIS — F4310 Post-traumatic stress disorder, unspecified: Secondary | ICD-10-CM | POA: Diagnosis not present

## 2019-06-05 DIAGNOSIS — F1021 Alcohol dependence, in remission: Secondary | ICD-10-CM

## 2019-06-05 DIAGNOSIS — F17200 Nicotine dependence, unspecified, uncomplicated: Secondary | ICD-10-CM | POA: Diagnosis not present

## 2019-06-05 DIAGNOSIS — F32A Depression, unspecified: Secondary | ICD-10-CM | POA: Insufficient documentation

## 2019-06-05 DIAGNOSIS — F419 Anxiety disorder, unspecified: Secondary | ICD-10-CM | POA: Insufficient documentation
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
## 2019-06-06 DIAGNOSIS — F4310 Post-traumatic stress disorder, unspecified: Secondary | ICD-10-CM | POA: Diagnosis not present

## 2019-06-07 DIAGNOSIS — F4310 Post-traumatic stress disorder, unspecified: Secondary | ICD-10-CM | POA: Diagnosis not present

## 2019-06-08 DIAGNOSIS — F4310 Post-traumatic stress disorder, unspecified: Secondary | ICD-10-CM | POA: Diagnosis not present

## 2019-06-09 ENCOUNTER — Other Ambulatory Visit: Payer: Self-pay | Admitting: Nurse Practitioner

## 2019-06-09 DIAGNOSIS — F4310 Post-traumatic stress disorder, unspecified: Secondary | ICD-10-CM | POA: Diagnosis not present

## 2019-06-09 DIAGNOSIS — K219 Gastro-esophageal reflux disease without esophagitis: Secondary | ICD-10-CM

## 2019-06-09 DIAGNOSIS — F3110 Bipolar disorder, current episode manic without psychotic features, unspecified: Secondary | ICD-10-CM | POA: Diagnosis not present

## 2019-06-10 DIAGNOSIS — F3110 Bipolar disorder, current episode manic without psychotic features, unspecified: Secondary | ICD-10-CM | POA: Diagnosis not present

## 2019-06-12 DIAGNOSIS — F4310 Post-traumatic stress disorder, unspecified: Secondary | ICD-10-CM | POA: Diagnosis not present

## 2019-06-13 DIAGNOSIS — F4310 Post-traumatic stress disorder, unspecified: Secondary | ICD-10-CM | POA: Diagnosis not present

## 2019-06-14 DIAGNOSIS — F4310 Post-traumatic stress disorder, unspecified: Secondary | ICD-10-CM | POA: Diagnosis not present

## 2019-06-15 DIAGNOSIS — F4310 Post-traumatic stress disorder, unspecified: Secondary | ICD-10-CM | POA: Diagnosis not present

## 2019-06-16 DIAGNOSIS — F3110 Bipolar disorder, current episode manic without psychotic features, unspecified: Secondary | ICD-10-CM | POA: Diagnosis not present

## 2019-06-16 DIAGNOSIS — F4310 Post-traumatic stress disorder, unspecified: Secondary | ICD-10-CM | POA: Diagnosis not present

## 2019-06-17 DIAGNOSIS — F3110 Bipolar disorder, current episode manic without psychotic features, unspecified: Secondary | ICD-10-CM | POA: Diagnosis not present

## 2019-06-19 DIAGNOSIS — F4310 Post-traumatic stress disorder, unspecified: Secondary | ICD-10-CM | POA: Diagnosis not present

## 2019-06-20 DIAGNOSIS — F4310 Post-traumatic stress disorder, unspecified: Secondary | ICD-10-CM | POA: Diagnosis not present

## 2019-06-21 DIAGNOSIS — F4310 Post-traumatic stress disorder, unspecified: Secondary | ICD-10-CM | POA: Diagnosis not present

## 2019-06-22 DIAGNOSIS — F4310 Post-traumatic stress disorder, unspecified: Secondary | ICD-10-CM | POA: Diagnosis not present

## 2019-06-23 DIAGNOSIS — F4310 Post-traumatic stress disorder, unspecified: Secondary | ICD-10-CM | POA: Diagnosis not present

## 2019-06-23 DIAGNOSIS — F3110 Bipolar disorder, current episode manic without psychotic features, unspecified: Secondary | ICD-10-CM | POA: Diagnosis not present

## 2019-06-24 DIAGNOSIS — F3110 Bipolar disorder, current episode manic without psychotic features, unspecified: Secondary | ICD-10-CM | POA: Diagnosis not present

## 2019-06-26 DIAGNOSIS — F4310 Post-traumatic stress disorder, unspecified: Secondary | ICD-10-CM | POA: Diagnosis not present

## 2019-06-27 DIAGNOSIS — F4310 Post-traumatic stress disorder, unspecified: Secondary | ICD-10-CM | POA: Diagnosis not present

## 2019-06-28 DIAGNOSIS — F4310 Post-traumatic stress disorder, unspecified: Secondary | ICD-10-CM | POA: Diagnosis not present

## 2019-06-29 DIAGNOSIS — F4310 Post-traumatic stress disorder, unspecified: Secondary | ICD-10-CM | POA: Diagnosis not present

## 2019-06-30 DIAGNOSIS — F4310 Post-traumatic stress disorder, unspecified: Secondary | ICD-10-CM | POA: Diagnosis not present

## 2019-06-30 DIAGNOSIS — F3110 Bipolar disorder, current episode manic without psychotic features, unspecified: Secondary | ICD-10-CM | POA: Diagnosis not present

## 2019-07-01 DIAGNOSIS — F3110 Bipolar disorder, current episode manic without psychotic features, unspecified: Secondary | ICD-10-CM | POA: Diagnosis not present

## 2019-07-03 DIAGNOSIS — F4310 Post-traumatic stress disorder, unspecified: Secondary | ICD-10-CM | POA: Diagnosis not present

## 2019-07-04 DIAGNOSIS — F4310 Post-traumatic stress disorder, unspecified: Secondary | ICD-10-CM | POA: Diagnosis not present

## 2019-07-05 DIAGNOSIS — F4310 Post-traumatic stress disorder, unspecified: Secondary | ICD-10-CM | POA: Diagnosis not present

## 2019-07-06 DIAGNOSIS — F4310 Post-traumatic stress disorder, unspecified: Secondary | ICD-10-CM | POA: Diagnosis not present

## 2019-07-07 DIAGNOSIS — F3110 Bipolar disorder, current episode manic without psychotic features, unspecified: Secondary | ICD-10-CM | POA: Diagnosis not present

## 2019-07-07 DIAGNOSIS — F4310 Post-traumatic stress disorder, unspecified: Secondary | ICD-10-CM | POA: Diagnosis not present

## 2019-07-08 DIAGNOSIS — F3110 Bipolar disorder, current episode manic without psychotic features, unspecified: Secondary | ICD-10-CM | POA: Diagnosis not present

## 2019-07-10 DIAGNOSIS — F4310 Post-traumatic stress disorder, unspecified: Secondary | ICD-10-CM | POA: Diagnosis not present

## 2019-07-11 DIAGNOSIS — F4310 Post-traumatic stress disorder, unspecified: Secondary | ICD-10-CM | POA: Diagnosis not present

## 2019-07-12 DIAGNOSIS — F4310 Post-traumatic stress disorder, unspecified: Secondary | ICD-10-CM | POA: Diagnosis not present

## 2019-07-13 DIAGNOSIS — F4310 Post-traumatic stress disorder, unspecified: Secondary | ICD-10-CM | POA: Diagnosis not present

## 2019-07-14 DIAGNOSIS — F3110 Bipolar disorder, current episode manic without psychotic features, unspecified: Secondary | ICD-10-CM | POA: Diagnosis not present

## 2019-07-14 DIAGNOSIS — F4310 Post-traumatic stress disorder, unspecified: Secondary | ICD-10-CM | POA: Diagnosis not present

## 2019-07-15 DIAGNOSIS — F3110 Bipolar disorder, current episode manic without psychotic features, unspecified: Secondary | ICD-10-CM | POA: Diagnosis not present

## 2019-07-17 DIAGNOSIS — F4310 Post-traumatic stress disorder, unspecified: Secondary | ICD-10-CM | POA: Diagnosis not present

## 2019-07-18 DIAGNOSIS — F4310 Post-traumatic stress disorder, unspecified: Secondary | ICD-10-CM | POA: Diagnosis not present

## 2019-07-19 DIAGNOSIS — F4310 Post-traumatic stress disorder, unspecified: Secondary | ICD-10-CM | POA: Diagnosis not present

## 2019-07-20 DIAGNOSIS — F4310 Post-traumatic stress disorder, unspecified: Secondary | ICD-10-CM | POA: Diagnosis not present

## 2019-07-21 DIAGNOSIS — F3110 Bipolar disorder, current episode manic without psychotic features, unspecified: Secondary | ICD-10-CM | POA: Diagnosis not present

## 2019-07-21 DIAGNOSIS — F4310 Post-traumatic stress disorder, unspecified: Secondary | ICD-10-CM | POA: Diagnosis not present

## 2019-07-22 DIAGNOSIS — F3110 Bipolar disorder, current episode manic without psychotic features, unspecified: Secondary | ICD-10-CM | POA: Diagnosis not present

## 2019-07-24 DIAGNOSIS — F4310 Post-traumatic stress disorder, unspecified: Secondary | ICD-10-CM | POA: Diagnosis not present

## 2019-07-25 DIAGNOSIS — F4310 Post-traumatic stress disorder, unspecified: Secondary | ICD-10-CM | POA: Diagnosis not present

## 2019-07-26 DIAGNOSIS — F4310 Post-traumatic stress disorder, unspecified: Secondary | ICD-10-CM | POA: Diagnosis not present

## 2019-07-27 DIAGNOSIS — F4310 Post-traumatic stress disorder, unspecified: Secondary | ICD-10-CM | POA: Diagnosis not present

## 2019-07-28 DIAGNOSIS — F4310 Post-traumatic stress disorder, unspecified: Secondary | ICD-10-CM | POA: Diagnosis not present

## 2019-07-28 DIAGNOSIS — F3110 Bipolar disorder, current episode manic without psychotic features, unspecified: Secondary | ICD-10-CM | POA: Diagnosis not present

## 2019-07-29 DIAGNOSIS — F3110 Bipolar disorder, current episode manic without psychotic features, unspecified: Secondary | ICD-10-CM | POA: Diagnosis not present

## 2019-07-31 DIAGNOSIS — F4310 Post-traumatic stress disorder, unspecified: Secondary | ICD-10-CM | POA: Diagnosis not present

## 2019-08-01 DIAGNOSIS — F4310 Post-traumatic stress disorder, unspecified: Secondary | ICD-10-CM | POA: Diagnosis not present

## 2019-08-02 DIAGNOSIS — F4310 Post-traumatic stress disorder, unspecified: Secondary | ICD-10-CM | POA: Diagnosis not present

## 2019-08-03 DIAGNOSIS — F4310 Post-traumatic stress disorder, unspecified: Secondary | ICD-10-CM | POA: Diagnosis not present

## 2019-08-04 DIAGNOSIS — F3110 Bipolar disorder, current episode manic without psychotic features, unspecified: Secondary | ICD-10-CM | POA: Diagnosis not present

## 2019-08-04 DIAGNOSIS — F4310 Post-traumatic stress disorder, unspecified: Secondary | ICD-10-CM | POA: Diagnosis not present

## 2019-08-05 DIAGNOSIS — F3110 Bipolar disorder, current episode manic without psychotic features, unspecified: Secondary | ICD-10-CM | POA: Diagnosis not present

## 2019-08-07 DIAGNOSIS — F4310 Post-traumatic stress disorder, unspecified: Secondary | ICD-10-CM | POA: Diagnosis not present

## 2019-08-08 DIAGNOSIS — F4310 Post-traumatic stress disorder, unspecified: Secondary | ICD-10-CM | POA: Diagnosis not present

## 2019-08-08 DIAGNOSIS — F3110 Bipolar disorder, current episode manic without psychotic features, unspecified: Secondary | ICD-10-CM | POA: Diagnosis not present

## 2019-08-09 DIAGNOSIS — F4310 Post-traumatic stress disorder, unspecified: Secondary | ICD-10-CM | POA: Diagnosis not present

## 2019-08-10 DIAGNOSIS — F4310 Post-traumatic stress disorder, unspecified: Secondary | ICD-10-CM | POA: Diagnosis not present

## 2019-08-11 DIAGNOSIS — F4310 Post-traumatic stress disorder, unspecified: Secondary | ICD-10-CM | POA: Diagnosis not present

## 2019-08-12 DIAGNOSIS — F3110 Bipolar disorder, current episode manic without psychotic features, unspecified: Secondary | ICD-10-CM | POA: Diagnosis not present

## 2019-08-14 DIAGNOSIS — F4310 Post-traumatic stress disorder, unspecified: Secondary | ICD-10-CM | POA: Diagnosis not present

## 2019-08-15 DIAGNOSIS — F4310 Post-traumatic stress disorder, unspecified: Secondary | ICD-10-CM | POA: Diagnosis not present

## 2019-08-16 DIAGNOSIS — F4310 Post-traumatic stress disorder, unspecified: Secondary | ICD-10-CM | POA: Diagnosis not present

## 2019-08-17 DIAGNOSIS — F4310 Post-traumatic stress disorder, unspecified: Secondary | ICD-10-CM | POA: Diagnosis not present

## 2019-08-18 DIAGNOSIS — F4310 Post-traumatic stress disorder, unspecified: Secondary | ICD-10-CM | POA: Diagnosis not present

## 2019-08-18 DIAGNOSIS — F3110 Bipolar disorder, current episode manic without psychotic features, unspecified: Secondary | ICD-10-CM | POA: Diagnosis not present

## 2019-08-18 DIAGNOSIS — A499 Bacterial infection, unspecified: Secondary | ICD-10-CM

## 2019-08-18 HISTORY — DX: Extended spectrum beta lactamase (ESBL) resistance: A49.9

## 2019-08-19 DIAGNOSIS — F3110 Bipolar disorder, current episode manic without psychotic features, unspecified: Secondary | ICD-10-CM | POA: Diagnosis not present

## 2019-08-21 DIAGNOSIS — F4310 Post-traumatic stress disorder, unspecified: Secondary | ICD-10-CM | POA: Diagnosis not present

## 2019-08-22 DIAGNOSIS — F4310 Post-traumatic stress disorder, unspecified: Secondary | ICD-10-CM | POA: Diagnosis not present

## 2019-08-23 DIAGNOSIS — F4310 Post-traumatic stress disorder, unspecified: Secondary | ICD-10-CM | POA: Diagnosis not present

## 2019-08-24 DIAGNOSIS — F4310 Post-traumatic stress disorder, unspecified: Secondary | ICD-10-CM | POA: Diagnosis not present

## 2019-08-25 DIAGNOSIS — F3110 Bipolar disorder, current episode manic without psychotic features, unspecified: Secondary | ICD-10-CM | POA: Diagnosis not present

## 2019-08-25 DIAGNOSIS — F4310 Post-traumatic stress disorder, unspecified: Secondary | ICD-10-CM | POA: Diagnosis not present

## 2019-08-26 DIAGNOSIS — F3110 Bipolar disorder, current episode manic without psychotic features, unspecified: Secondary | ICD-10-CM | POA: Diagnosis not present

## 2019-08-28 DIAGNOSIS — F4310 Post-traumatic stress disorder, unspecified: Secondary | ICD-10-CM | POA: Diagnosis not present

## 2019-08-29 DIAGNOSIS — F4310 Post-traumatic stress disorder, unspecified: Secondary | ICD-10-CM | POA: Diagnosis not present

## 2019-08-30 DIAGNOSIS — F4310 Post-traumatic stress disorder, unspecified: Secondary | ICD-10-CM | POA: Diagnosis not present

## 2019-08-31 DIAGNOSIS — F4310 Post-traumatic stress disorder, unspecified: Secondary | ICD-10-CM | POA: Diagnosis not present

## 2019-09-01 DIAGNOSIS — F4310 Post-traumatic stress disorder, unspecified: Secondary | ICD-10-CM | POA: Diagnosis not present

## 2019-09-04 DIAGNOSIS — F4310 Post-traumatic stress disorder, unspecified: Secondary | ICD-10-CM | POA: Diagnosis not present

## 2019-09-05 DIAGNOSIS — F4310 Post-traumatic stress disorder, unspecified: Secondary | ICD-10-CM | POA: Diagnosis not present

## 2019-09-06 DIAGNOSIS — F4310 Post-traumatic stress disorder, unspecified: Secondary | ICD-10-CM | POA: Diagnosis not present

## 2019-09-07 DIAGNOSIS — F4310 Post-traumatic stress disorder, unspecified: Secondary | ICD-10-CM | POA: Diagnosis not present

## 2019-09-08 DIAGNOSIS — F3110 Bipolar disorder, current episode manic without psychotic features, unspecified: Secondary | ICD-10-CM | POA: Diagnosis not present

## 2019-09-08 DIAGNOSIS — F4310 Post-traumatic stress disorder, unspecified: Secondary | ICD-10-CM | POA: Diagnosis not present

## 2019-09-09 DIAGNOSIS — F3110 Bipolar disorder, current episode manic without psychotic features, unspecified: Secondary | ICD-10-CM | POA: Diagnosis not present

## 2019-09-11 DIAGNOSIS — F4310 Post-traumatic stress disorder, unspecified: Secondary | ICD-10-CM | POA: Diagnosis not present

## 2019-09-12 DIAGNOSIS — F4310 Post-traumatic stress disorder, unspecified: Secondary | ICD-10-CM | POA: Diagnosis not present

## 2019-09-13 DIAGNOSIS — F4310 Post-traumatic stress disorder, unspecified: Secondary | ICD-10-CM | POA: Diagnosis not present

## 2019-09-14 DIAGNOSIS — F4310 Post-traumatic stress disorder, unspecified: Secondary | ICD-10-CM | POA: Diagnosis not present

## 2019-09-15 DIAGNOSIS — F3110 Bipolar disorder, current episode manic without psychotic features, unspecified: Secondary | ICD-10-CM | POA: Diagnosis not present

## 2019-09-15 DIAGNOSIS — F4310 Post-traumatic stress disorder, unspecified: Secondary | ICD-10-CM | POA: Diagnosis not present

## 2019-09-16 DIAGNOSIS — F3110 Bipolar disorder, current episode manic without psychotic features, unspecified: Secondary | ICD-10-CM | POA: Diagnosis not present

## 2019-09-18 DIAGNOSIS — F4310 Post-traumatic stress disorder, unspecified: Secondary | ICD-10-CM | POA: Diagnosis not present

## 2019-09-19 DIAGNOSIS — F4310 Post-traumatic stress disorder, unspecified: Secondary | ICD-10-CM | POA: Diagnosis not present

## 2019-09-20 DIAGNOSIS — F4310 Post-traumatic stress disorder, unspecified: Secondary | ICD-10-CM | POA: Diagnosis not present

## 2019-09-21 DIAGNOSIS — F4310 Post-traumatic stress disorder, unspecified: Secondary | ICD-10-CM | POA: Diagnosis not present

## 2019-09-22 DIAGNOSIS — F3110 Bipolar disorder, current episode manic without psychotic features, unspecified: Secondary | ICD-10-CM | POA: Diagnosis not present

## 2019-09-22 DIAGNOSIS — F4310 Post-traumatic stress disorder, unspecified: Secondary | ICD-10-CM | POA: Diagnosis not present

## 2019-09-23 DIAGNOSIS — F3110 Bipolar disorder, current episode manic without psychotic features, unspecified: Secondary | ICD-10-CM | POA: Diagnosis not present

## 2019-09-25 DIAGNOSIS — F4310 Post-traumatic stress disorder, unspecified: Secondary | ICD-10-CM | POA: Diagnosis not present

## 2019-09-26 DIAGNOSIS — F4310 Post-traumatic stress disorder, unspecified: Secondary | ICD-10-CM | POA: Diagnosis not present

## 2019-09-27 DIAGNOSIS — F4310 Post-traumatic stress disorder, unspecified: Secondary | ICD-10-CM | POA: Diagnosis not present

## 2019-09-28 DIAGNOSIS — F4310 Post-traumatic stress disorder, unspecified: Secondary | ICD-10-CM | POA: Diagnosis not present

## 2019-09-29 DIAGNOSIS — F4310 Post-traumatic stress disorder, unspecified: Secondary | ICD-10-CM | POA: Diagnosis not present

## 2019-09-29 DIAGNOSIS — F3110 Bipolar disorder, current episode manic without psychotic features, unspecified: Secondary | ICD-10-CM | POA: Diagnosis not present

## 2019-09-30 DIAGNOSIS — F3110 Bipolar disorder, current episode manic without psychotic features, unspecified: Secondary | ICD-10-CM | POA: Diagnosis not present

## 2019-10-06 DIAGNOSIS — F3110 Bipolar disorder, current episode manic without psychotic features, unspecified: Secondary | ICD-10-CM | POA: Diagnosis not present

## 2019-10-07 DIAGNOSIS — F3110 Bipolar disorder, current episode manic without psychotic features, unspecified: Secondary | ICD-10-CM | POA: Diagnosis not present

## 2019-10-09 DIAGNOSIS — F4310 Post-traumatic stress disorder, unspecified: Secondary | ICD-10-CM | POA: Diagnosis not present

## 2019-10-10 DIAGNOSIS — F4310 Post-traumatic stress disorder, unspecified: Secondary | ICD-10-CM | POA: Diagnosis not present

## 2019-10-11 DIAGNOSIS — F4310 Post-traumatic stress disorder, unspecified: Secondary | ICD-10-CM | POA: Diagnosis not present

## 2019-10-12 DIAGNOSIS — F4310 Post-traumatic stress disorder, unspecified: Secondary | ICD-10-CM | POA: Diagnosis not present

## 2019-10-13 DIAGNOSIS — F4310 Post-traumatic stress disorder, unspecified: Secondary | ICD-10-CM | POA: Diagnosis not present

## 2019-10-13 DIAGNOSIS — F3110 Bipolar disorder, current episode manic without psychotic features, unspecified: Secondary | ICD-10-CM | POA: Diagnosis not present

## 2019-10-14 DIAGNOSIS — F3110 Bipolar disorder, current episode manic without psychotic features, unspecified: Secondary | ICD-10-CM | POA: Diagnosis not present

## 2019-10-16 DIAGNOSIS — F4310 Post-traumatic stress disorder, unspecified: Secondary | ICD-10-CM | POA: Diagnosis not present

## 2019-10-17 DIAGNOSIS — F4310 Post-traumatic stress disorder, unspecified: Secondary | ICD-10-CM | POA: Diagnosis not present

## 2019-10-18 DIAGNOSIS — F4310 Post-traumatic stress disorder, unspecified: Secondary | ICD-10-CM | POA: Diagnosis not present

## 2019-10-19 DIAGNOSIS — F4310 Post-traumatic stress disorder, unspecified: Secondary | ICD-10-CM | POA: Diagnosis not present

## 2019-10-20 DIAGNOSIS — F4310 Post-traumatic stress disorder, unspecified: Secondary | ICD-10-CM | POA: Diagnosis not present

## 2019-10-20 DIAGNOSIS — F3110 Bipolar disorder, current episode manic without psychotic features, unspecified: Secondary | ICD-10-CM | POA: Diagnosis not present

## 2019-10-21 DIAGNOSIS — F3110 Bipolar disorder, current episode manic without psychotic features, unspecified: Secondary | ICD-10-CM | POA: Diagnosis not present

## 2019-10-23 DIAGNOSIS — F4310 Post-traumatic stress disorder, unspecified: Secondary | ICD-10-CM | POA: Diagnosis not present

## 2019-10-24 DIAGNOSIS — F4310 Post-traumatic stress disorder, unspecified: Secondary | ICD-10-CM | POA: Diagnosis not present

## 2019-10-25 DIAGNOSIS — F4310 Post-traumatic stress disorder, unspecified: Secondary | ICD-10-CM | POA: Diagnosis not present

## 2019-10-26 DIAGNOSIS — F4310 Post-traumatic stress disorder, unspecified: Secondary | ICD-10-CM | POA: Diagnosis not present

## 2019-10-27 DIAGNOSIS — F3110 Bipolar disorder, current episode manic without psychotic features, unspecified: Secondary | ICD-10-CM | POA: Diagnosis not present

## 2019-10-27 DIAGNOSIS — F4310 Post-traumatic stress disorder, unspecified: Secondary | ICD-10-CM | POA: Diagnosis not present

## 2019-10-28 DIAGNOSIS — F3110 Bipolar disorder, current episode manic without psychotic features, unspecified: Secondary | ICD-10-CM | POA: Diagnosis not present

## 2019-10-30 DIAGNOSIS — F4310 Post-traumatic stress disorder, unspecified: Secondary | ICD-10-CM | POA: Diagnosis not present

## 2019-10-31 ENCOUNTER — Emergency Department
Admission: EM | Admit: 2019-10-31 | Discharge: 2019-10-31 | Disposition: A | Discharge status: Home / Self Care | Payer: Medicaid Other | Attending: Emergency Medicine | Admitting: Emergency Medicine

## 2019-10-31 ENCOUNTER — Encounter: Payer: Self-pay | Admitting: Emergency Medicine

## 2019-10-31 ENCOUNTER — Other Ambulatory Visit: Payer: Self-pay

## 2019-10-31 DIAGNOSIS — N183 Chronic kidney disease, stage 3 unspecified: Secondary | ICD-10-CM | POA: Insufficient documentation

## 2019-10-31 DIAGNOSIS — F1210 Cannabis abuse, uncomplicated: Secondary | ICD-10-CM | POA: Insufficient documentation

## 2019-10-31 DIAGNOSIS — K852 Alcohol induced acute pancreatitis without necrosis or infection: Secondary | ICD-10-CM | POA: Diagnosis not present

## 2019-10-31 DIAGNOSIS — R1010 Upper abdominal pain, unspecified: Secondary | ICD-10-CM | POA: Diagnosis present

## 2019-10-31 DIAGNOSIS — R1111 Vomiting without nausea: Secondary | ICD-10-CM | POA: Diagnosis not present

## 2019-10-31 DIAGNOSIS — Z79899 Other long term (current) drug therapy: Secondary | ICD-10-CM | POA: Diagnosis not present

## 2019-10-31 DIAGNOSIS — F17210 Nicotine dependence, cigarettes, uncomplicated: Secondary | ICD-10-CM | POA: Insufficient documentation

## 2019-10-31 DIAGNOSIS — F4310 Post-traumatic stress disorder, unspecified: Secondary | ICD-10-CM | POA: Diagnosis not present

## 2019-10-31 DIAGNOSIS — I10 Essential (primary) hypertension: Secondary | ICD-10-CM | POA: Diagnosis not present

## 2019-10-31 DIAGNOSIS — R110 Nausea: Secondary | ICD-10-CM | POA: Diagnosis not present

## 2019-10-31 DIAGNOSIS — R5381 Other malaise: Secondary | ICD-10-CM | POA: Diagnosis not present

## 2019-10-31 DIAGNOSIS — R1084 Generalized abdominal pain: Secondary | ICD-10-CM | POA: Diagnosis not present

## 2019-10-31 DIAGNOSIS — I129 Hypertensive chronic kidney disease with stage 1 through stage 4 chronic kidney disease, or unspecified chronic kidney disease: Secondary | ICD-10-CM | POA: Diagnosis not present

## 2019-10-31 LAB — CBC
HCT: 42.3 % (ref 39.0–52.0)
Hemoglobin: 14.6 g/dL (ref 13.0–17.0)
MCH: 32.4 pg (ref 26.0–34.0)
MCHC: 34.5 g/dL (ref 30.0–36.0)
MCV: 93.8 fL (ref 80.0–100.0)
Platelets: 249 10*3/uL (ref 150–400)
RBC: 4.51 MIL/uL (ref 4.22–5.81)
RDW: 12.4 % (ref 11.5–15.5)
WBC: 8 10*3/uL (ref 4.0–10.5)
nRBC: 0 % (ref 0.0–0.2)

## 2019-10-31 LAB — URINALYSIS, COMPLETE (UACMP) WITH MICROSCOPIC
Bacteria, UA: NONE SEEN
Bilirubin Urine: NEGATIVE
Glucose, UA: NEGATIVE mg/dL
Hgb urine dipstick: NEGATIVE
Ketones, ur: NEGATIVE mg/dL
Leukocytes,Ua: NEGATIVE
Nitrite: NEGATIVE
Protein, ur: NEGATIVE mg/dL
Specific Gravity, Urine: 1.008 (ref 1.005–1.030)
Squamous Epithelial / HPF: NONE SEEN (ref 0–5)
pH: 5 (ref 5.0–8.0)

## 2019-10-31 LAB — COMPREHENSIVE METABOLIC PANEL
ALT: 26 U/L (ref 0–44)
AST: 31 U/L (ref 15–41)
Albumin: 4.1 g/dL (ref 3.5–5.0)
Alkaline Phosphatase: 83 U/L (ref 38–126)
Anion gap: 13 (ref 5–15)
BUN: 11 mg/dL (ref 6–20)
CO2: 20 mmol/L — ABNORMAL LOW (ref 22–32)
Calcium: 9 mg/dL (ref 8.9–10.3)
Chloride: 98 mmol/L (ref 98–111)
Creatinine, Ser: 1.09 mg/dL (ref 0.61–1.24)
GFR calc Af Amer: >60 mL/min (ref >60)
GFR calc non Af Amer: >60 mL/min (ref >60)
Glucose, Bld: 84 mg/dL (ref 70–99)
Potassium: 3.7 mmol/L (ref 3.5–5.1)
Sodium: 131 mmol/L — ABNORMAL LOW (ref 135–145)
Total Bilirubin: 0.6 mg/dL (ref 0.3–1.2)
Total Protein: 8.6 g/dL — ABNORMAL HIGH (ref 6.5–8.1)

## 2019-10-31 LAB — LIPASE, BLOOD: Lipase: 168 U/L — ABNORMAL HIGH (ref 11–51)

## 2019-10-31 MED ORDER — MORPHINE SULFATE (PF) 4 MG/ML IV SOLN
4.0000 mg | Freq: Once | INTRAVENOUS | Status: AC
  Administered 2019-10-31: 4 mg via INTRAVENOUS
  Filled 2019-10-31: qty 1

## 2019-10-31 MED ORDER — ONDANSETRON HCL 4 MG/2ML IJ SOLN
4.0000 mg | Freq: Once | INTRAMUSCULAR | Status: AC
  Administered 2019-10-31: 18:00:00 4 mg via INTRAVENOUS
  Filled 2019-10-31: qty 2

## 2019-10-31 MED ORDER — ONDANSETRON 4 MG PO TBDP: 4.0000 mg | ORAL_TABLET | Freq: Three times a day (TID) | ORAL | 0 refills | Status: DC | PRN

## 2019-10-31 MED ORDER — OXYCODONE-ACETAMINOPHEN 5-325 MG PO TABS: 1.0000 | ORAL_TABLET | ORAL | 0 refills | Status: AC | PRN

## 2019-10-31 NOTE — ED Provider Notes (Addendum)
[facility] Emergency Department Provider Note  ____________________________________________   First MD Initiated Contact with Patient 10/31/19 1730     (approximate)  I have reviewed the triage vital signs and the nursing notes.   HISTORY  Chief Complaint Abdominal Pain    HPI Noah Velasquez is a 54 y.o. male with below list of previous medical conditions including previous episodes of pancreatitis secondary to alcohol use disorder presents to the emergency department secondary to 2-day history of upper abdominal pain which patient states is currently 9 out of 10 with associated nausea.  Of note patient states last EtOH ingestion was 2 days ago before onset of symptoms    Past Medical History:  Diagnosis Date  . Chronic hip pain   . Chronic pancreatitis ([place])   . CKD (chronic kidney disease) stage 3, GFR 30-59 ml/min   . GERD (gastroesophageal reflux disease)   . Hyperlipidemia   . Hypertension   . Osteoarthritis   . Pancreatitis   . Pancreatitis   . PTSD (post-traumatic stress disorder)   . Viral infection of left eye     Patient Active Problem List   Diagnosis Date Noted  . MDD (major depressive disorder), recurrent, in full remission ([place]) 06/05/2019  . Alcohol use disorder, moderate, in sustained remission ([place]) 04/06/2019  . Avascular necrosis of bone of left hip ([place]) 06/20/2018  . History of bad fall 06/20/2018  . Acute on chronic pancreatitis ([place]) 06/09/2018  . Acute on chronic respiratory failure ([place]) 05/06/2018  . Impacted cerumen of both ears 09/23/2017  . Acute renal failure (ARF) ([place]) 04/03/2017  . 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 Cervical lymphadenopathy 10/18/2016  . Mediastinal lymphadenopathy 10/18/2016  . Pancreatitis 04/29/2016  . Anemia 03/09/2016  . Unspecified exophthalmos 03/09/2016  . 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 Chronic pain due to injury 03/06/2015  . Depression 03/04/2015  . Essential  hypertension 02/15/2015  . Controlled substance agreement terminated 11/23/2014  . Mood disorder ([place]) 10/05/2014  . Cigarette nicotine dependence without complication 89/38/1017  . Cannabis use disorder, mild, abuse 10/05/2014  . 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 Primary osteoarthritis of right hip 08/01/2012  . Tobacco use disorder 04/07/2010  . PTSD (post-traumatic stress disorder) 06/12/2009    Past Surgical History:  Procedure Laterality Date  . pancreatic stent placement      Prior to Admission medications   Medication Sig Start Date End Date Taking? Authorizing Provider  amLODipine (NORVASC) 10 MG tablet TAKE 1 TABLET BY MOUTH EVERY DAY 05/16/19   Mikey College, NP  buPROPion (WELLBUTRIN XL) 150 MG 24 hr tablet Take 1 tablet (150 mg total) by mouth daily. 04/14/19   Mikey College, NP  famotidine (PEPCID) 20 MG tablet TAKE 1 TABLET BY MOUTH TWICE A DAY 06/09/19   Mikey College, NP  fluticasone [facility]) 50 MCG/ACT nasal spray Place 1 spray into both nostrils daily as needed for allergies or rhinitis. 10/11/18   Mikey College, NP  gabapentin (NEURONTIN) 100 MG capsule Take 1 capsule (100 mg total) by mouth at bedtime as needed (sleep). Patient not taking: Reported on 06/05/2019 05/09/18   Mikey College, NP  loratadine (CLARITIN) 10 MG tablet Take 1 tablet (10 mg total) by mouth daily. Patient not taking: Reported on 06/05/2019 10/11/18   Mikey College, NP  losartan (COZAAR) 100 MG tablet Take 1 tablet (100 mg total) by mouth daily.  10/20/18   Mikey College, NP  nicotine (NICODERM CQ - DOSED IN MG/24 HOURS) 14 mg/24hr patch Place 1 patch (14 mg total) onto the skin daily. Patient not taking: Reported on 06/05/2019 04/08/19   Otila Back, MD  oxyCODONE (OXY IR/ROXICODONE) 5 MG immediate release tablet Take 1 tablet (5 mg total) by mouth every 4 (four) hours as needed for severe  pain. Patient not taking: Reported on 06/05/2019 04/07/19   Otila Back, MD  sertraline (ZOLOFT) 100 MG tablet Take 1 tablet (100 mg total) by mouth daily. 04/14/19   Mikey College, NP  traZODone (DESYREL) 150 MG tablet Take 1 tablet (150 mg total) by mouth at bedtime as needed for sleep. 10/20/18   Mikey College, NP  ZENPEP [phone] units CPEP TAKE 1 CAPSULE BY MOUTH 3 (THREE) TIMES DAILY WITH MEALS. 04/10/19   Mikey College, NP  Zoster Vaccine Adjuvanted [facility]) injection Shingrix (PF) 50 mcg/0.5 mL intramuscular suspension, kit  TO BE ADMINISTERED BY PHARMACIST FOR IMMUNIZATION (2ND DOSE IN 2 TO 6 MONTHS)    [provider]    Allergies Hctz [hydrochlorothiazide]  Family History  Problem Relation Age of Onset  . Diabetes Mother   . Sleep apnea Mother   . Emphysema Sister   . CAD Neg Hx   . Mental illness Neg Hx     Social History Social History   Tobacco Use  . Smoking status: Current Every Day Smoker    Packs/day: 0.25    Years: 15.00    Pack years: 3.75    Types: Cigarettes  . Smokeless tobacco: Never Used  Substance Use Topics  . Alcohol use: Yes    Comment: 80 ounces a week  . Drug use: Yes    Types: Marijuana    Review of Systems Constitutional: No fever/chills Eyes: No visual changes. ENT: No sore throat. Cardiovascular: Denies chest pain. Respiratory: Denies shortness of breath. Gastrointestinal: Positive for abdominal pain nausea and vomiting Genitourinary: Negative for dysuria. Musculoskeletal: Negative for neck pain.  Negative for back pain. Integumentary: Negative for rash. Neurological: Negative for headaches, focal weakness or numbness.   ____________________________________________   PHYSICAL EXAM:  VITAL SIGNS: ED Triage Vitals  Enc Vitals Group     BP 10/31/19 1550 (!) 150/84     Pulse Rate 10/31/19 1550 74     Resp 10/31/19 1550 16     Temp 10/31/19 1550 98.2 F (36.8 C)     Temp Source 10/31/19 1550  Oral     SpO2 10/31/19 1550 99 %     Weight 10/31/19 1551 83.5 kg (184 lb)     Height 10/31/19 1551 1.702 m ('5\' 7"' )     Head Circumference --      Peak Flow --      Pain Score 10/31/19 1551 10     Pain Loc --      Pain Edu? --      Excl. in [place]? --     Constitutional: Alert and oriented.  Eyes: Conjunctivae are normal.  Mouth/Throat: Patient is wearing a mask. Neck: No stridor.  No meningeal signs.   Cardiovascular: Normal rate, regular rhythm. Good peripheral circulation. Grossly normal heart sounds. Respiratory: Normal respiratory effort.  No retractions. Gastrointestinal: Epigastric tenderness to palpation.. No distention.  Musculoskeletal: No lower extremity tenderness nor edema. No gross deformities of extremities. Neurologic:  Normal speech and language. No gross focal neurologic deficits are appreciated.  Skin:  Skin is warm, dry and intact. Psychiatric: Mood  and affect are normal. Speech and behavior are normal.  ____________________________________________   LABS (all labs ordered are listed, but only abnormal results are displayed)  Labs Reviewed  LIPASE, BLOOD - Abnormal; Notable for the following components:      Result Value   Lipase 168 (*)    All other components within normal limits  COMPREHENSIVE METABOLIC PANEL - Abnormal; Notable for the following components:   Sodium 131 (*)    CO2 20 (*)    Total Protein 8.6 (*)    All other components within normal limits  URINALYSIS, COMPLETE (UACMP) WITH MICROSCOPIC - Abnormal; Notable for the following components:   Color, Urine STRAW (*)    APPearance CLEAR (*)    All other components within normal limits  CBC      Procedures   ____________________________________________   INITIAL IMPRESSION / MDM / ASSESSMENT AND PLAN / ED COURSE  As part of my medical decision making, I reviewed the following data within the electronic MEDICAL RECORD NUMBER   54 year old male presented with above-stated history and  physical exam consistent with acute pancreatitis which was confirmed on laboratory data as the patient has a lipase of 168.  Additional laboratory data unremarkable including white blood cell count.  Patient given IV morphine and Zofran in the emergency department improvement of symptoms.  Patient able to tolerate eating and drinking before discharge.      ____________________________________________  FINAL CLINICAL IMPRESSION(S) / ED DIAGNOSES  Final diagnoses:  Alcohol-induced acute pancreatitis, unspecified complication status     MEDICATIONS GIVEN DURING THIS VISIT:  Medications  morphine 4 MG/ML injection 4 mg (4 mg Intravenous Given 10/31/19 1755)  ondansetron (ZOFRAN) injection 4 mg (4 mg Intravenous Given 10/31/19 1755)     ED Discharge Orders    None      *Please note:  Naren Benally was evaluated in Emergency Department on 10/31/2019 for the symptoms described in the history of present illness. He was evaluated in the context of the global COVID-19 pandemic, which necessitated consideration that the patient might be at risk for infection with the SARS-CoV-2 virus that causes COVID-19. Institutional protocols and algorithms that pertain to the evaluation of patients at risk for COVID-19 are in a state of rapid change based on information released by regulatory bodies including the CDC and federal and state organizations. These policies and algorithms were followed during the patient's care in the ED.  Some ED evaluations and interventions may be delayed as a result of limited staffing during the pandemic.*  Note:  This document was prepared using Dragon voice recognition software and may include unintentional dictation errors.   Gregor Hams, MD 10/31/19 2034    Gregor Hams, MD 10/31/19 2035

## 2019-10-31 NOTE — ED Triage Notes (Signed)
Pt comes into the ED via EMS from home with c/o LUQ pain with N/V for the past 3-4 days with a hx of pancreatitis, CBG 116, #18g LAC

## 2019-10-31 NOTE — ED Notes (Signed)
Pt handed urinal.

## 2019-10-31 NOTE — ED Notes (Signed)
Pt provided water and crackers for PO challenge  

## 2019-11-01 DIAGNOSIS — F4310 Post-traumatic stress disorder, unspecified: Secondary | ICD-10-CM | POA: Diagnosis not present

## 2019-11-02 DIAGNOSIS — F4310 Post-traumatic stress disorder, unspecified: Secondary | ICD-10-CM | POA: Diagnosis not present

## 2019-11-03 DIAGNOSIS — F4310 Post-traumatic stress disorder, unspecified: Secondary | ICD-10-CM | POA: Diagnosis not present

## 2019-11-03 DIAGNOSIS — F3110 Bipolar disorder, current episode manic without psychotic features, unspecified: Secondary | ICD-10-CM | POA: Diagnosis not present

## 2019-11-04 DIAGNOSIS — F3110 Bipolar disorder, current episode manic without psychotic features, unspecified: Secondary | ICD-10-CM | POA: Diagnosis not present

## 2019-11-06 DIAGNOSIS — F4310 Post-traumatic stress disorder, unspecified: Secondary | ICD-10-CM | POA: Diagnosis not present

## 2019-11-07 ENCOUNTER — Other Ambulatory Visit: Payer: Self-pay | Admitting: Nurse Practitioner

## 2019-11-07 DIAGNOSIS — F329 Major depressive disorder, single episode, unspecified: Secondary | ICD-10-CM

## 2019-11-07 DIAGNOSIS — F4310 Post-traumatic stress disorder, unspecified: Secondary | ICD-10-CM

## 2019-11-08 DIAGNOSIS — F4310 Post-traumatic stress disorder, unspecified: Secondary | ICD-10-CM | POA: Diagnosis not present

## 2019-11-09 DIAGNOSIS — F4310 Post-traumatic stress disorder, unspecified: Secondary | ICD-10-CM | POA: Diagnosis not present

## 2019-11-10 DIAGNOSIS — F3110 Bipolar disorder, current episode manic without psychotic features, unspecified: Secondary | ICD-10-CM | POA: Diagnosis not present

## 2019-11-10 DIAGNOSIS — F4310 Post-traumatic stress disorder, unspecified: Secondary | ICD-10-CM | POA: Diagnosis not present

## 2019-11-11 DIAGNOSIS — F3110 Bipolar disorder, current episode manic without psychotic features, unspecified: Secondary | ICD-10-CM | POA: Diagnosis not present

## 2019-11-13 DIAGNOSIS — F4310 Post-traumatic stress disorder, unspecified: Secondary | ICD-10-CM | POA: Diagnosis not present

## 2019-11-14 DIAGNOSIS — F4310 Post-traumatic stress disorder, unspecified: Secondary | ICD-10-CM | POA: Diagnosis not present

## 2019-11-15 DIAGNOSIS — F4310 Post-traumatic stress disorder, unspecified: Secondary | ICD-10-CM | POA: Diagnosis not present

## 2019-11-16 DIAGNOSIS — F4310 Post-traumatic stress disorder, unspecified: Secondary | ICD-10-CM | POA: Diagnosis not present

## 2019-11-17 DIAGNOSIS — F4310 Post-traumatic stress disorder, unspecified: Secondary | ICD-10-CM | POA: Diagnosis not present

## 2019-11-17 DIAGNOSIS — F3110 Bipolar disorder, current episode manic without psychotic features, unspecified: Secondary | ICD-10-CM | POA: Diagnosis not present

## 2019-11-18 DIAGNOSIS — F3110 Bipolar disorder, current episode manic without psychotic features, unspecified: Secondary | ICD-10-CM | POA: Diagnosis not present

## 2019-11-20 DIAGNOSIS — F4310 Post-traumatic stress disorder, unspecified: Secondary | ICD-10-CM | POA: Diagnosis not present

## 2019-11-21 DIAGNOSIS — F4310 Post-traumatic stress disorder, unspecified: Secondary | ICD-10-CM | POA: Diagnosis not present

## 2019-11-22 ENCOUNTER — Ambulatory Visit: Payer: Medicaid Other | Admitting: Family Medicine

## 2019-11-22 DIAGNOSIS — F4310 Post-traumatic stress disorder, unspecified: Secondary | ICD-10-CM | POA: Diagnosis not present

## 2019-11-23 DIAGNOSIS — F4310 Post-traumatic stress disorder, unspecified: Secondary | ICD-10-CM | POA: Diagnosis not present

## 2019-11-24 DIAGNOSIS — F4310 Post-traumatic stress disorder, unspecified: Secondary | ICD-10-CM | POA: Diagnosis not present

## 2019-11-24 DIAGNOSIS — F3110 Bipolar disorder, current episode manic without psychotic features, unspecified: Secondary | ICD-10-CM | POA: Diagnosis not present

## 2019-11-25 DIAGNOSIS — F3110 Bipolar disorder, current episode manic without psychotic features, unspecified: Secondary | ICD-10-CM | POA: Diagnosis not present

## 2019-11-27 DIAGNOSIS — F4310 Post-traumatic stress disorder, unspecified: Secondary | ICD-10-CM | POA: Diagnosis not present

## 2019-11-28 DIAGNOSIS — F4310 Post-traumatic stress disorder, unspecified: Secondary | ICD-10-CM | POA: Diagnosis not present

## 2019-11-29 DIAGNOSIS — F4310 Post-traumatic stress disorder, unspecified: Secondary | ICD-10-CM | POA: Diagnosis not present

## 2019-11-30 ENCOUNTER — Ambulatory Visit: Payer: Medicaid Other | Admitting: Family Medicine

## 2019-11-30 DIAGNOSIS — F4310 Post-traumatic stress disorder, unspecified: Secondary | ICD-10-CM | POA: Diagnosis not present

## 2019-12-01 DIAGNOSIS — F3110 Bipolar disorder, current episode manic without psychotic features, unspecified: Secondary | ICD-10-CM | POA: Diagnosis not present

## 2019-12-01 DIAGNOSIS — F4310 Post-traumatic stress disorder, unspecified: Secondary | ICD-10-CM | POA: Diagnosis not present

## 2019-12-02 DIAGNOSIS — F3110 Bipolar disorder, current episode manic without psychotic features, unspecified: Secondary | ICD-10-CM | POA: Diagnosis not present

## 2019-12-04 DIAGNOSIS — F4310 Post-traumatic stress disorder, unspecified: Secondary | ICD-10-CM | POA: Diagnosis not present

## 2019-12-05 ENCOUNTER — Encounter: Payer: Self-pay | Admitting: Emergency Medicine

## 2019-12-05 ENCOUNTER — Other Ambulatory Visit: Payer: Self-pay

## 2019-12-05 ENCOUNTER — Inpatient Hospital Stay
Admission: EM | Admit: 2019-12-05 | Discharge: 2019-12-09 | DRG: 871 | Disposition: A | Discharge status: Home / Self Care | Payer: Medicaid Other | Attending: Internal Medicine | Admitting: Internal Medicine

## 2019-12-05 ENCOUNTER — Emergency Department: Payer: Medicaid Other

## 2019-12-05 DIAGNOSIS — K860 Alcohol-induced chronic pancreatitis: Secondary | ICD-10-CM | POA: Diagnosis not present

## 2019-12-05 DIAGNOSIS — M1990 Unspecified osteoarthritis, unspecified site: Secondary | ICD-10-CM | POA: Diagnosis present

## 2019-12-05 DIAGNOSIS — Z79891 Long term (current) use of opiate analgesic: Secondary | ICD-10-CM

## 2019-12-05 DIAGNOSIS — F17210 Nicotine dependence, cigarettes, uncomplicated: Secondary | ICD-10-CM | POA: Diagnosis present

## 2019-12-05 DIAGNOSIS — Z79899 Other long term (current) drug therapy: Secondary | ICD-10-CM | POA: Diagnosis not present

## 2019-12-05 DIAGNOSIS — F1490 Cocaine use, unspecified, uncomplicated: Secondary | ICD-10-CM | POA: Diagnosis present

## 2019-12-05 DIAGNOSIS — F329 Major depressive disorder, single episode, unspecified: Secondary | ICD-10-CM | POA: Diagnosis present

## 2019-12-05 DIAGNOSIS — A419 Sepsis, unspecified organism: Secondary | ICD-10-CM | POA: Diagnosis present

## 2019-12-05 DIAGNOSIS — F4310 Post-traumatic stress disorder, unspecified: Secondary | ICD-10-CM | POA: Diagnosis not present

## 2019-12-05 DIAGNOSIS — R1110 Vomiting, unspecified: Secondary | ICD-10-CM | POA: Diagnosis not present

## 2019-12-05 DIAGNOSIS — N183 Chronic kidney disease, stage 3 unspecified: Secondary | ICD-10-CM | POA: Diagnosis present

## 2019-12-05 DIAGNOSIS — Z888 Allergy status to other drugs, medicaments and biological substances status: Secondary | ICD-10-CM | POA: Diagnosis not present

## 2019-12-05 DIAGNOSIS — I700 Atherosclerosis of aorta: Secondary | ICD-10-CM | POA: Diagnosis present

## 2019-12-05 DIAGNOSIS — I10 Essential (primary) hypertension: Secondary | ICD-10-CM | POA: Diagnosis not present

## 2019-12-05 DIAGNOSIS — K862 Cyst of pancreas: Secondary | ICD-10-CM | POA: Diagnosis present

## 2019-12-05 DIAGNOSIS — R10811 Right upper quadrant abdominal tenderness: Secondary | ICD-10-CM

## 2019-12-05 DIAGNOSIS — A4151 Sepsis due to Escherichia coli [E. coli]: Principal | ICD-10-CM | POA: Diagnosis present

## 2019-12-05 DIAGNOSIS — I129 Hypertensive chronic kidney disease with stage 1 through stage 4 chronic kidney disease, or unspecified chronic kidney disease: Secondary | ICD-10-CM | POA: Diagnosis present

## 2019-12-05 DIAGNOSIS — Z20822 Contact with and (suspected) exposure to covid-19: Secondary | ICD-10-CM | POA: Diagnosis present

## 2019-12-05 DIAGNOSIS — F32A Depression, unspecified: Secondary | ICD-10-CM | POA: Diagnosis present

## 2019-12-05 DIAGNOSIS — K219 Gastro-esophageal reflux disease without esophagitis: Secondary | ICD-10-CM | POA: Diagnosis present

## 2019-12-05 DIAGNOSIS — R1011 Right upper quadrant pain: Secondary | ICD-10-CM | POA: Diagnosis not present

## 2019-12-05 DIAGNOSIS — K8689 Other specified diseases of pancreas: Secondary | ICD-10-CM | POA: Diagnosis not present

## 2019-12-05 DIAGNOSIS — K863 Pseudocyst of pancreas: Secondary | ICD-10-CM | POA: Diagnosis present

## 2019-12-05 DIAGNOSIS — F3110 Bipolar disorder, current episode manic without psychotic features, unspecified: Secondary | ICD-10-CM | POA: Diagnosis not present

## 2019-12-05 DIAGNOSIS — R0902 Hypoxemia: Secondary | ICD-10-CM | POA: Diagnosis not present

## 2019-12-05 DIAGNOSIS — R7881 Bacteremia: Secondary | ICD-10-CM | POA: Diagnosis not present

## 2019-12-05 DIAGNOSIS — F1020 Alcohol dependence, uncomplicated: Secondary | ICD-10-CM | POA: Diagnosis present

## 2019-12-05 DIAGNOSIS — Z833 Family history of diabetes mellitus: Secondary | ICD-10-CM

## 2019-12-05 DIAGNOSIS — K859 Acute pancreatitis without necrosis or infection, unspecified: Secondary | ICD-10-CM | POA: Diagnosis not present

## 2019-12-05 DIAGNOSIS — Z8719 Personal history of other diseases of the digestive system: Secondary | ICD-10-CM | POA: Diagnosis not present

## 2019-12-05 DIAGNOSIS — Z825 Family history of asthma and other chronic lower respiratory diseases: Secondary | ICD-10-CM

## 2019-12-05 DIAGNOSIS — R112 Nausea with vomiting, unspecified: Secondary | ICD-10-CM | POA: Diagnosis not present

## 2019-12-05 DIAGNOSIS — M879 Osteonecrosis, unspecified: Secondary | ICD-10-CM | POA: Diagnosis present

## 2019-12-05 DIAGNOSIS — R109 Unspecified abdominal pain: Secondary | ICD-10-CM

## 2019-12-05 DIAGNOSIS — K858 Other acute pancreatitis without necrosis or infection: Secondary | ICD-10-CM | POA: Diagnosis not present

## 2019-12-05 DIAGNOSIS — E785 Hyperlipidemia, unspecified: Secondary | ICD-10-CM | POA: Diagnosis present

## 2019-12-05 DIAGNOSIS — K861 Other chronic pancreatitis: Secondary | ICD-10-CM | POA: Diagnosis not present

## 2019-12-05 DIAGNOSIS — M25559 Pain in unspecified hip: Secondary | ICD-10-CM | POA: Diagnosis present

## 2019-12-05 DIAGNOSIS — K852 Alcohol induced acute pancreatitis without necrosis or infection: Secondary | ICD-10-CM | POA: Diagnosis present

## 2019-12-05 DIAGNOSIS — R188 Other ascites: Secondary | ICD-10-CM | POA: Diagnosis present

## 2019-12-05 DIAGNOSIS — B9620 Unspecified Escherichia coli [E. coli] as the cause of diseases classified elsewhere: Secondary | ICD-10-CM | POA: Diagnosis not present

## 2019-12-05 DIAGNOSIS — N200 Calculus of kidney: Secondary | ICD-10-CM | POA: Diagnosis present

## 2019-12-05 DIAGNOSIS — I152 Hypertension secondary to endocrine disorders: Secondary | ICD-10-CM | POA: Diagnosis present

## 2019-12-05 DIAGNOSIS — G8929 Other chronic pain: Secondary | ICD-10-CM | POA: Diagnosis present

## 2019-12-05 DIAGNOSIS — K4090 Unilateral inguinal hernia, without obstruction or gangrene, not specified as recurrent: Secondary | ICD-10-CM | POA: Diagnosis present

## 2019-12-05 DIAGNOSIS — M47816 Spondylosis without myelopathy or radiculopathy, lumbar region: Secondary | ICD-10-CM | POA: Diagnosis present

## 2019-12-05 DIAGNOSIS — R1084 Generalized abdominal pain: Secondary | ICD-10-CM | POA: Diagnosis not present

## 2019-12-05 DIAGNOSIS — R52 Pain, unspecified: Secondary | ICD-10-CM | POA: Diagnosis not present

## 2019-12-05 DIAGNOSIS — K449 Diaphragmatic hernia without obstruction or gangrene: Secondary | ICD-10-CM | POA: Diagnosis present

## 2019-12-05 LAB — PROTIME-INR
INR: 1.3 — ABNORMAL HIGH (ref 0.8–1.2)
Prothrombin Time: 15.6 seconds — ABNORMAL HIGH (ref 11.4–15.2)

## 2019-12-05 LAB — CBC WITH DIFFERENTIAL/PLATELET
Abs Immature Granulocytes: 0.03 10*3/uL (ref 0.00–0.07)
Basophils Absolute: 0.1 10*3/uL (ref 0.0–0.1)
Basophils Relative: 1 %
Eosinophils Absolute: 0.1 10*3/uL (ref 0.0–0.5)
Eosinophils Relative: 1 %
HCT: 40.2 % (ref 39.0–52.0)
Hemoglobin: 13.6 g/dL (ref 13.0–17.0)
Immature Granulocytes: 0 %
Lymphocytes Relative: 9 %
Lymphs Abs: 0.7 10*3/uL (ref 0.7–4.0)
MCH: 32.2 pg (ref 26.0–34.0)
MCHC: 33.8 g/dL (ref 30.0–36.0)
MCV: 95 fL (ref 80.0–100.0)
Monocytes Absolute: 0.1 10*3/uL (ref 0.1–1.0)
Monocytes Relative: 1 %
Neutro Abs: 7.5 10*3/uL (ref 1.7–7.7)
Neutrophils Relative %: 88 %
Platelets: 231 10*3/uL (ref 150–400)
RBC: 4.23 MIL/uL (ref 4.22–5.81)
RDW: 12.9 % (ref 11.5–15.5)
WBC: 8.5 10*3/uL (ref 4.0–10.5)
nRBC: 0 % (ref 0.0–0.2)

## 2019-12-05 LAB — RESPIRATORY PANEL BY RT PCR (FLU A&B, COVID)
Influenza A by PCR: NEGATIVE
Influenza B by PCR: NEGATIVE
SARS Coronavirus 2 by RT PCR: NEGATIVE

## 2019-12-05 LAB — URINALYSIS, COMPLETE (UACMP) WITH MICROSCOPIC
Bacteria, UA: NONE SEEN
Bilirubin Urine: NEGATIVE
Glucose, UA: NEGATIVE mg/dL
Hgb urine dipstick: NEGATIVE
Ketones, ur: NEGATIVE mg/dL
Leukocytes,Ua: NEGATIVE
Nitrite: NEGATIVE
Protein, ur: NEGATIVE mg/dL
Specific Gravity, Urine: 1.032 — ABNORMAL HIGH (ref 1.005–1.030)
Squamous Epithelial / HPF: NONE SEEN (ref 0–5)
pH: 7 (ref 5.0–8.0)

## 2019-12-05 LAB — COMPREHENSIVE METABOLIC PANEL
ALT: 16 U/L (ref 0–44)
AST: 21 U/L (ref 15–41)
Albumin: 4 g/dL (ref 3.5–5.0)
Alkaline Phosphatase: 77 U/L (ref 38–126)
Anion gap: 11 (ref 5–15)
BUN: 10 mg/dL (ref 6–20)
CO2: 22 mmol/L (ref 22–32)
Calcium: 9 mg/dL (ref 8.9–10.3)
Chloride: 101 mmol/L (ref 98–111)
Creatinine, Ser: 1.08 mg/dL (ref 0.61–1.24)
GFR calc Af Amer: >60 mL/min (ref >60)
GFR calc non Af Amer: >60 mL/min (ref >60)
Glucose, Bld: 173 mg/dL — ABNORMAL HIGH (ref 70–99)
Potassium: 3.9 mmol/L (ref 3.5–5.1)
Sodium: 134 mmol/L — ABNORMAL LOW (ref 135–145)
Total Bilirubin: 1 mg/dL (ref 0.3–1.2)
Total Protein: 8.5 g/dL — ABNORMAL HIGH (ref 6.5–8.1)

## 2019-12-05 LAB — POC SARS CORONAVIRUS 2 AG: SARS Coronavirus 2 Ag: NEGATIVE

## 2019-12-05 LAB — ETHANOL: Alcohol, Ethyl (B): <10 mg/dL (ref <10)

## 2019-12-05 LAB — LIPASE, BLOOD: Lipase: 163 U/L — ABNORMAL HIGH (ref 11–51)

## 2019-12-05 LAB — PROCALCITONIN: Procalcitonin: 0.45 ng/mL

## 2019-12-05 LAB — LACTIC ACID, PLASMA: Lactic Acid, Venous: 1.2 mmol/L (ref 0.5–1.9)

## 2019-12-05 MED ORDER — LORAZEPAM 2 MG/ML IJ SOLN: 0.0000 mg | Freq: Four times a day (QID) | INTRAMUSCULAR | Status: AC

## 2019-12-05 MED ORDER — THIAMINE HCL 100 MG/ML IJ SOLN
Freq: Once | INTRAVENOUS | Status: AC
  Filled 2019-12-05: qty 1000

## 2019-12-05 MED ORDER — ENOXAPARIN SODIUM 40 MG/0.4ML ~~LOC~~ SOLN
40.0000 mg | SUBCUTANEOUS | Status: DC
  Administered 2019-12-05 – 2019-12-08 (×4): 40 mg via SUBCUTANEOUS
  Filled 2019-12-05 (×4): qty 0.4

## 2019-12-05 MED ORDER — BUPROPION HCL ER (XL) 150 MG PO TB24
150.0000 mg | ORAL_TABLET | Freq: Every day | ORAL | Status: DC
  Administered 2019-12-05 – 2019-12-09 (×5): 150 mg via ORAL
  Filled 2019-12-05 (×5): qty 1

## 2019-12-05 MED ORDER — LORAZEPAM 1 MG PO TABS
1.0000 mg | ORAL_TABLET | ORAL | Status: AC | PRN
  Administered 2019-12-08 (×2): 2 mg via ORAL
  Filled 2019-12-05 (×2): qty 2

## 2019-12-05 MED ORDER — SODIUM CHLORIDE 0.9 % IV SOLN
2.0000 g | Freq: Once | INTRAVENOUS | Status: AC
  Administered 2019-12-05: 2 g via INTRAVENOUS
  Filled 2019-12-05: qty 2

## 2019-12-05 MED ORDER — IOHEXOL 300 MG/ML  SOLN
100.0000 mL | Freq: Once | INTRAMUSCULAR | Status: AC | PRN
  Administered 2019-12-05: 100 mL via INTRAVENOUS

## 2019-12-05 MED ORDER — GABAPENTIN 100 MG PO CAPS
100.0000 mg | ORAL_CAPSULE | Freq: Every evening | ORAL | Status: DC | PRN
  Filled 2019-12-05: qty 1

## 2019-12-05 MED ORDER — METRONIDAZOLE IN NACL 5-0.79 MG/ML-% IV SOLN
500.0000 mg | Freq: Once | INTRAVENOUS | Status: AC
  Administered 2019-12-05: 500 mg via INTRAVENOUS
  Filled 2019-12-05: qty 100

## 2019-12-05 MED ORDER — HYDROMORPHONE HCL 1 MG/ML IJ SOLN: 1.0000 mg | Freq: Once | INTRAMUSCULAR | Status: AC

## 2019-12-05 MED ORDER — SODIUM CHLORIDE 0.9 % IV BOLUS (SEPSIS)
2000.0000 mL | Freq: Once | INTRAVENOUS | Status: AC
  Administered 2019-12-05: 2000 mL via INTRAVENOUS

## 2019-12-05 MED ORDER — KETOROLAC TROMETHAMINE 30 MG/ML IJ SOLN
15.0000 mg | INTRAMUSCULAR | Status: AC
  Administered 2019-12-05: 15 mg via INTRAVENOUS
  Filled 2019-12-05: qty 1

## 2019-12-05 MED ORDER — TRAZODONE HCL 50 MG PO TABS: 150.0000 mg | ORAL_TABLET | Freq: Every evening | ORAL | Status: DC | PRN

## 2019-12-05 MED ORDER — LORATADINE 10 MG PO TABS
10.0000 mg | ORAL_TABLET | Freq: Every day | ORAL | Status: DC
  Administered 2019-12-06 – 2019-12-07 (×2): 10 mg via ORAL
  Filled 2019-12-05 (×2): qty 1

## 2019-12-05 MED ORDER — SODIUM CHLORIDE 0.9 % IV BOLUS
1000.0000 mL | Freq: Once | INTRAVENOUS | Status: AC
  Administered 2019-12-05: 06:00:00 1000 mL via INTRAVENOUS

## 2019-12-05 MED ORDER — ACETAMINOPHEN 325 MG PO TABS
650.0000 mg | ORAL_TABLET | Freq: Once | ORAL | Status: AC
  Administered 2019-12-05: 650 mg via ORAL
  Filled 2019-12-05: qty 2

## 2019-12-05 MED ORDER — LORAZEPAM 2 MG/ML IJ SOLN: 0.0000 mg | Freq: Two times a day (BID) | INTRAMUSCULAR | Status: DC

## 2019-12-05 MED ORDER — PANTOPRAZOLE SODIUM 40 MG IV SOLR
40.0000 mg | Freq: Every day | INTRAVENOUS | Status: DC
  Administered 2019-12-05 – 2019-12-09 (×5): 40 mg via INTRAVENOUS
  Filled 2019-12-05 (×5): qty 40

## 2019-12-05 MED ORDER — NICOTINE 14 MG/24HR TD PT24
14.0000 mg | MEDICATED_PATCH | Freq: Every day | TRANSDERMAL | Status: DC
  Administered 2019-12-05 – 2019-12-09 (×5): 14 mg via TRANSDERMAL
  Filled 2019-12-05 (×8): qty 1

## 2019-12-05 MED ORDER — SERTRALINE HCL 50 MG PO TABS
100.0000 mg | ORAL_TABLET | Freq: Every day | ORAL | Status: DC
  Administered 2019-12-06 – 2019-12-09 (×4): 100 mg via ORAL
  Filled 2019-12-05 (×5): qty 2

## 2019-12-05 MED ORDER — OXYCODONE HCL 5 MG PO TABS
5.0000 mg | ORAL_TABLET | Freq: Four times a day (QID) | ORAL | Status: DC | PRN
  Administered 2019-12-05 – 2019-12-09 (×11): 5 mg via ORAL
  Filled 2019-12-05 (×12): qty 1

## 2019-12-05 MED ORDER — FLUTICASONE PROPIONATE 50 MCG/ACT NA SUSP
1.0000 | Freq: Every day | NASAL | Status: DC | PRN
  Filled 2019-12-05: qty 16

## 2019-12-05 MED ORDER — MORPHINE SULFATE (PF) 2 MG/ML IV SOLN
2.0000 mg | INTRAVENOUS | Status: DC | PRN
  Administered 2019-12-05 – 2019-12-09 (×15): 2 mg via INTRAVENOUS
  Filled 2019-12-05 (×16): qty 1

## 2019-12-05 MED ORDER — METRONIDAZOLE IN NACL 5-0.79 MG/ML-% IV SOLN
500.0000 mg | Freq: Three times a day (TID) | INTRAVENOUS | Status: DC
  Administered 2019-12-05 – 2019-12-06 (×2): 500 mg via INTRAVENOUS
  Filled 2019-12-05 (×4): qty 100

## 2019-12-05 MED ORDER — SODIUM CHLORIDE 0.9 % IV SOLN
2.0000 g | Freq: Three times a day (TID) | INTRAVENOUS | Status: DC
  Administered 2019-12-05 – 2019-12-06 (×2): 2 g via INTRAVENOUS
  Filled 2019-12-05 (×4): qty 2

## 2019-12-05 MED ORDER — HYDROMORPHONE HCL 1 MG/ML IJ SOLN
INTRAMUSCULAR | Status: AC
  Administered 2019-12-05: 1 mg via INTRAVENOUS
  Filled 2019-12-05: qty 1

## 2019-12-05 MED ORDER — LORAZEPAM 2 MG/ML IJ SOLN
1.0000 mg | INTRAMUSCULAR | Status: AC | PRN
  Administered 2019-12-07: 2 mg via INTRAVENOUS
  Filled 2019-12-05: qty 1

## 2019-12-05 NOTE — Consult Note (Signed)
Pharmacy Antibiotic Note  Noah Velasquez is a 53 y.o. male admitted on 12/05/2019 with Intra-abdominal.  Pharmacy has been consulted for cefepime dosing.  Plan: Cefepime 2g q8H   Height: 5\' 8"  (172.7 cm) Weight: 83.5 kg (184 lb) IBW/kg (Calculated) : 68.4  Temp (24hrs), Avg:99.4 F (37.4 C), Min:97.9 F (36.6 C), Max:100.9 F (38.3 C)  Recent Labs  Lab 12/05/19 0527 12/05/19 1017  WBC 8.5  --   CREATININE 1.08  --   LATICACIDVEN  --  1.2    Estimated Creatinine Clearance: 82.3 mL/min (by C-G formula based on SCr of 1.08 mg/dL).    Allergies  Allergen Reactions  . Hctz [Hydrochlorothiazide] Swelling    Antimicrobials this admission: 4/20 cefepime >>   Dose adjustments this admission: None  Microbiology results: 4/20 BCx: pending  Thank you for allowing pharmacy to be a part of this patient's care.  Oswald Hillock, PharmD, BCPS  12/05/2019 12:31 PM

## 2019-12-05 NOTE — ED Notes (Signed)
Unable to obtain CIWA d/t pt sleeping.

## 2019-12-05 NOTE — ED Triage Notes (Signed)
EMS brought pt in from home for c/o left lower abd pain accomp by N/V since last night; st hx pancreatitis; st last drink of ETOH yesterday

## 2019-12-05 NOTE — ED Provider Notes (Addendum)
[facility] Emergency Department Provider Note  ____________________________________________  Time seen: Approximately 9:49 AM  I have reviewed the triage vital signs and the nursing notes.   HISTORY  Chief Complaint Abdominal Pain    HPI Noah Velasquez is a 54 y.o. male with a history of pancreatitis CKD hypertension is brought to the ED by EMS due to left lower quadrant abdominal pain with nausea vomiting that started last night.  Reports it feels like pancreatitis which he has had multiple times in the past.  Pain is constant, nonradiating, severe 10/10, associated nausea and vomiting.  No aggravating or alleviating factors.   Worse with eating.  Patient has been unable to eat or drink for last 24 hours     Past Medical History:  Diagnosis Date  . Chronic hip pain   . Chronic pancreatitis ([place])   . CKD (chronic kidney disease) stage 3, GFR 30-59 ml/min   . GERD (gastroesophageal reflux disease)   . Hyperlipidemia   . Hypertension   . Osteoarthritis   . Pancreatitis   . Pancreatitis   . PTSD (post-traumatic stress disorder)   . Viral infection of left eye      Patient Active Problem List   Diagnosis Date Noted  . MDD (major depressive disorder), recurrent, in full remission ([place]) 06/05/2019  . Alcohol use disorder, moderate, in sustained remission ([place]) 04/06/2019  . Avascular necrosis of bone of left hip ([place]) 06/20/2018  . History of bad fall 06/20/2018  . Acute on chronic pancreatitis ([place]) 06/09/2018  . Acute on chronic respiratory failure ([place]) 05/06/2018  . Impacted cerumen of both ears 09/23/2017  . Acute renal failure (ARF) ([place]) 04/03/2017  . 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 Cervical lymphadenopathy 10/18/2016  . Mediastinal lymphadenopathy 10/18/2016  . Pancreatitis 04/29/2016  . Anemia 03/09/2016  . Unspecified exophthalmos 03/09/2016  . 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 Chronic pain due to injury 03/06/2015  .  Depression 03/04/2015  . Essential hypertension 02/15/2015  . Controlled substance agreement terminated 11/23/2014  . Mood disorder ([place]) 10/05/2014  . Cigarette nicotine dependence without complication 78/67/5449  . Cannabis use disorder, mild, abuse 10/05/2014  . 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 Primary osteoarthritis of right hip 08/01/2012  . Tobacco use disorder 04/07/2010  . PTSD (post-traumatic stress disorder) 06/12/2009     Past Surgical History:  Procedure Laterality Date  . pancreatic stent placement       Prior to Admission medications   Medication Sig Start Date End Date Taking? Authorizing Provider  amLODipine (NORVASC) 10 MG tablet TAKE 1 TABLET BY MOUTH EVERY DAY 05/16/19   Mikey College, NP  buPROPion (WELLBUTRIN XL) 150 MG 24 hr tablet Take 1 tablet (150 mg total) by mouth daily. 04/14/19   Mikey College, NP  famotidine (PEPCID) 20 MG tablet TAKE 1 TABLET BY MOUTH TWICE A DAY 06/09/19   Mikey College, NP  fluticasone [facility]) 50 MCG/ACT nasal spray Place 1 spray into both nostrils daily as needed for allergies or rhinitis. 10/11/18   Mikey College, NP  gabapentin (NEURONTIN) 100 MG capsule Take 1 capsule (100 mg total) by mouth at bedtime as needed (sleep). Patient not taking: Reported on 06/05/2019 05/09/18   Mikey College, NP  loratadine (CLARITIN) 10 MG tablet Take 1 tablet (10 mg total) by mouth daily. Patient not taking: Reported on 06/05/2019 10/11/18   Mikey College, NP  losartan (COZAAR) 100 MG tablet Take 1 tablet (100 mg total) by mouth daily. 10/20/18   Mikey College, NP  nicotine (NICODERM CQ - DOSED IN MG/24 HOURS) 14 mg/24hr patch Place 1 patch (14 mg total) onto the skin daily. Patient not taking: Reported on 06/05/2019 04/08/19   Otila Back, MD  ondansetron (ZOFRAN ODT) 4 MG disintegrating tablet Take 1 tablet (4 mg total) by mouth every 8  (eight) hours as needed. 10/31/19   Gregor Hams, MD  oxyCODONE (OXY IR/ROXICODONE) 5 MG immediate release tablet Take 1 tablet (5 mg total) by mouth every 4 (four) hours as needed for severe pain. Patient not taking: Reported on 06/05/2019 04/07/19   Otila Back, MD  sertraline (ZOLOFT) 100 MG tablet TAKE 1 TABLET BY MOUTH EVERY DAY 11/07/19   Malfi, Lupita Raider, FNP  traZODone (DESYREL) 150 MG tablet Take 1 tablet (150 mg total) by mouth at bedtime as needed for sleep. 10/20/18   Mikey College, NP  ZENPEP [phone] units CPEP TAKE 1 CAPSULE BY MOUTH 3 (THREE) TIMES DAILY WITH MEALS. 04/10/19   Mikey College, NP  Zoster Vaccine Adjuvanted [facility]) injection Shingrix (PF) 50 mcg/0.5 mL intramuscular suspension, kit  TO BE ADMINISTERED BY PHARMACIST FOR IMMUNIZATION (2ND DOSE IN 2 TO 6 MONTHS)    [provider]     Allergies Hctz [hydrochlorothiazide]   Family History  Problem Relation Age of Onset  . Diabetes Mother   . Sleep apnea Mother   . Emphysema Sister   . CAD Neg Hx   . Mental illness Neg Hx     Social History Social History   Tobacco Use  . Smoking status: Current Every Day Smoker    Packs/day: 0.25    Years: 15.00    Pack years: 3.75    Types: Cigarettes  . Smokeless tobacco: Never Used  Substance Use Topics  . Alcohol use: Yes    Comment: 80 ounces a week  . Drug use: Yes    Types: Marijuana    Review of Systems  Constitutional:   No fever or chills.  ENT:   No sore throat. No rhinorrhea. Cardiovascular:   No chest pain or syncope. Respiratory:   No dyspnea or cough. Gastrointestinal:   Positive as above for abdominal pain and vomiting.  Musculoskeletal:   Negative for focal pain or swelling All other systems reviewed and are negative except as documented above in ROS and HPI.  ____________________________________________   PHYSICAL EXAM:  VITAL SIGNS: ED Triage Vitals  Enc Vitals Group     BP 12/05/19 0515 (!) 84/40      Pulse Rate 12/05/19 0518 83     Resp 12/05/19 0518 18     Temp 12/05/19 0518 97.9 F (36.6 C)     Temp Source 12/05/19 0518 Oral     SpO2 12/05/19 0518 100 %     Weight 12/05/19 0515 184 lb (83.5 kg)     Height 12/05/19 0515 '5\' 8"'  (1.727 m)     Head Circumference --      Peak Flow --      Pain Score 12/05/19 0515 10     Pain Loc --      Pain Edu? --      Excl. in [place]? --     Vital signs reviewed, nursing assessments reviewed.   Constitutional:   Alert and oriented. Non-toxic appearance. Eyes:   Conjunctivae are normal. EOMI. PERRL. ENT      Head:  Normocephalic and atraumatic.      Nose:   Wearing a mask.      Mouth/Throat:   Wearing a mask.      Neck:   No meningismus. Full ROM. Hematological/Lymphatic/Immunilogical:   No cervical lymphadenopathy. Cardiovascular:   Tachycardia heart rate 105. Symmetric bilateral radial and DP pulses.  No murmurs. Cap refill less than 2 seconds. Respiratory:   Normal respiratory effort without tachypnea/retractions. Breath sounds are clear and equal bilaterally. No wheezes/rales/rhonchi. Gastrointestinal:   Soft with diffuse left-sided abdominal tenderness, worse in left lower quadrant. Non distended. There is no CVA tenderness.  No rebound, rigidity, or guarding. Musculoskeletal:   Normal range of motion in all extremities. No joint effusions.  No lower extremity tenderness.  No edema. Neurologic:   Normal speech and language.  Motor grossly intact. No acute focal neurologic deficits are appreciated.  Skin:    Skin is warm, dry and intact. No rash noted.  No petechiae, purpura, or bullae.  ____________________________________________    LABS (pertinent positives/negatives) (all labs ordered are listed, but only abnormal results are displayed) Labs Reviewed  COMPREHENSIVE METABOLIC PANEL - Abnormal; Notable for the following components:      Result Value   Sodium 134 (*)    Glucose, Bld 173 (*)    Total Protein 8.5 (*)    All other  components within normal limits  LIPASE, BLOOD - Abnormal; Notable for the following components:   Lipase 163 (*)    All other components within normal limits  PROTIME-INR - Abnormal; Notable for the following components:   Prothrombin Time 15.6 (*)    INR 1.3 (*)    All other components within normal limits  RESPIRATORY PANEL BY RT PCR (FLU A&B, COVID)  CULTURE, BLOOD (ROUTINE X 2)  CULTURE, BLOOD (ROUTINE X 2)  URINE CULTURE  CBC WITH DIFFERENTIAL/PLATELET  ETHANOL  LACTIC ACID, PLASMA  PROCALCITONIN  URINALYSIS, COMPLETE (UACMP) WITH MICROSCOPIC  POC SARS CORONAVIRUS 2 AG -  ED  POC SARS CORONAVIRUS 2 AG   ____________________________________________   EKG    ____________________________________________    RADIOLOGY  CT ABDOMEN PELVIS W CONTRAST  Result Date: 12/05/2019 CLINICAL DATA:  Left lower abdominal pain with nausea and vomiting. EXAM: CT ABDOMEN AND PELVIS WITH CONTRAST TECHNIQUE: Multidetector CT imaging of the abdomen and pelvis was performed using the standard protocol following bolus administration of intravenous contrast. CONTRAST:  190m OMNIPAQUE IOHEXOL 300 MG/ML  SOLN COMPARISON:  04/05/2019 FINDINGS: Lower chest: The peripheral interstitial accentuation in the lung bases is mildly increased compared to prior. Some of this is more confluent dependently and probably represents component of dependent atelectasis given the crowding of vessels. Mild cardiomegaly. Descending thoracic aortic atherosclerosis. Small hiatal hernia. Adjacent lymph nodes near the gastroesophageal junction are similar to prior and measure up to 0.8 cm in short axis, significance uncertain. Right coronary artery atherosclerotic calcification. Hepatobiliary: Punctate calcifications along the dome the liver favoring old granulomatous disease. Gallbladder unremarkable. Pancreas: Considerable pancreatic calcifications favoring chronic calcific pancreatitis. Dilated dorsal pancreatic duct  terminating in a 2.0 by 1.9 by 2.0 cm cystic lesion or focal dilatation on image 26/2, by my measurements this was 1.9 by 1.1 by 1.3 cm on 04/29/2016 and more recently 2.0 by 1.7 by 2.0 cm on 04/05/2019. Accordingly there has been no formal ?interval growth? since 04/05/2019. This lesion could reflect chronic residua from inflammation, or intraductal papillary mucinous neoplasm. There is subtle stranding adjacent to the tail of the pancreas suspicious for acute focal  pancreatitis. However, this is less striking than on the 04/05/2019 exam. Spleen: Unremarkable Adrenals/Urinary Tract: 1.1 by 1.2 by 1.7 cm nodule of the left adrenal gland with portal venous phase density 75 Hounsfield units and delayed phase density 48 Hounsfield units yielding a relative washout of less than 40% which is indeterminate. This did not have a significant degree of dropout of signal on out of phase images the prior MRI from 05/06/2018 and also had a density of 19 Hounsfield units on precontrast images of 04/02/2017 accordingly remaining nonspecific. However, the lack of growth of this lesion compared to the earliest available comparison of 04/29/2016 strongly favors a benign etiology. Scattered scarring in both kidneys, right greater than left, similar pattern, distribution, appearance compared to 04/05/2019. 2 mm left kidney upper pole nonobstructive renal calculuson image 36/6. Stomach/Bowel: Small hiatal hernia. Normal appendix. Vascular/Lymphatic: Aortoiliac atherosclerotic vascular disease. Scattered small retroperitoneal lymph nodes are present. Right inguinal node 1.3 cm in short axis on image 81/2, formerly the same. Right external iliac node along the right inguinal ring, 0.8 cm in short axis on image 73/2. Reproductive: Unremarkable Other: Subtle presacral stranding is unchanged from the prior exam. Musculoskeletal: Very small umbilical hernia containing adipose tissue and a margin of a small bowel loop without complicating  feature. Small indirect right inguinal hernia contains adipose tissue. Markedly severe and asymmetric degenerative arthropathy of the right hip. Lumbar spondylosis, degenerative disc disease, and congenitally short pedicles contributing to suspected impingement at multiple lumbar levels. IMPRESSION: 1. Subtle stranding adjacent to the tail of the pancreas suspicious for acute focal pancreatitis. However, this is less striking than on the 04/05/2019 exam. 2. Chronic calcific pancreatitis with a dilated dorsal pancreatic duct terminating in a 2.0 cm cystic lesion or focal dilatation on image 26/2, but without formal interval growth since 04/05/2019. This could reflect chronic residua from inflammation, or intraductal papillary mucinous neoplasm. Surveillance imaging is suggested by pancreatic protocol CT or MRI in 2 years time. This recommendation follows ACR consensus guidelines: Management of Incidental Pancreatic Cysts: A White Paper of the ACR Incidental Findings Committee. J Am Coll Radiol 4403;47:425-956. 3. Other imaging findings of potential clinical significance: Coronary atherosclerosis. Mild cardiomegaly. Small hiatal hernia. Small indirect right inguinal hernia containing adipose tissue. Markedly severe and asymmetric degenerative arthropathy of the right hip. Lumbar spondylosis, degenerative disc disease, and congenitally short pedicles contributing to suspected impingement at multiple lumbar levels. 4. 2 mm left kidney upper pole nonobstructive renal calculus. 5. Aortic atherosclerosis. Aortic Atherosclerosis (ICD10-I70.0). Electronically Signed   By: Van Clines M.D.   On: 12/05/2019 11:07    ____________________________________________   PROCEDURES .Critical Care Performed by: Carrie Mew, MD Authorized by: Carrie Mew, MD   Critical care provider statement:    Critical care time (minutes):  35   Critical care time was exclusive of:  Separately billable procedures and  treating other patients   Critical care was necessary to treat or prevent imminent or life-threatening deterioration of the following conditions:  Sepsis, shock and dehydration   Critical care was time spent personally by me on the following activities:  Development of treatment plan with patient or surrogate, discussions with consultants, evaluation of patient's response to treatment, examination of patient, obtaining history from patient or surrogate, ordering and performing treatments and interventions, ordering and review of laboratory studies, ordering and review of radiographic studies, pulse oximetry, re-evaluation of patient's condition and review of old charts    ____________________________________________  DIFFERENTIAL DIAGNOSIS   Diverticulitis, bowel perforation, pancreatitis, bowel  obstruction, sepsis  CLINICAL IMPRESSION / ASSESSMENT AND PLAN / ED COURSE  Medications ordered in the ED: Medications  sodium chloride 0.9 % bolus 1,000 mL (0 mLs Intravenous Stopped 12/05/19 0620)  acetaminophen (TYLENOL) tablet 650 mg (650 mg Oral Given 12/05/19 0805)  ceFEPIme (MAXIPIME) 2 g in sodium chloride 0.9 % 100 mL IVPB (0 g Intravenous Stopped 12/05/19 1045)  metroNIDAZOLE (FLAGYL) IVPB 500 mg (500 mg Intravenous New Bag/Given 12/05/19 1047)  sodium chloride 0.9 % bolus 2,000 mL (2,000 mLs Intravenous New Bag/Given 12/05/19 1009)  iohexol (OMNIPAQUE) 300 MG/ML solution 100 mL (100 mLs Intravenous Contrast Given 12/05/19 1012)  ketorolac (TORADOL) 30 MG/ML injection 15 mg (15 mg Intravenous Given 12/05/19 1037)    Pertinent labs & imaging results that were available during my care of the patient were reviewed by me and considered in my medical decision making (see chart for details).  Noah Velasquez was evaluated in Emergency Department on 12/05/2019 for the symptoms described in the history of present illness. He was evaluated in the context of the global COVID-19 pandemic, which necessitated  consideration that the patient might be at risk for infection with the SARS-CoV-2 virus that causes COVID-19. Institutional protocols and algorithms that pertain to the evaluation of patients at risk for COVID-19 are in a state of rapid change based on information released by regulatory bodies including the CDC and federal and state organizations. These policies and algorithms were followed during the patient's care in the ED.   Patient presents with abdominal pain and vomiting.  Initial triage vital signs found the patient to be hypotensive and he was given a liter of saline at that time.  Other vital signs were normal at that time.  However, repeat vital signs obtained showed fever and tachycardia at 7:51 AM.  Evolving clinical picture is that of sepsis and with initial hypotension presumably septic shock due to intra-abdominal source.  Code sepsis initiated on initial assessment once patient arrived to the ED treatment bed at about 9:40 AM.  Flagyl and cefepime ordered for antibiotic coverage.  Additional 2 L of saline bolus ordered to complete 30 mL/kg bolus for initial resuscitation.  Will need to obtain CT abdomen pelvis to evaluate for surgical disease.  Clinical Course as of Dec 04 1145  Tue Dec 05, 2019  1039 CT imaging viewed by me, does show pancreatic pseudocyst.  No large volume free air.  Will follow up radiology report   [PS]  9509 Lactate normal    [PS]    Clinical Course User Index [PS] Carrie Mew, MD    ----------------------------------------- 11:48 AM on 12/05/2019 -----------------------------------------  Radiology report confirms pancreatic cyst which is stable in appearance compared to previous, no free air or free fluid.  Covid testing is negative.  Case discussed with hospitalist for further management.  Sepsis reassessment has been completed.  ----------------------------------------- 12:41 PM on 12/05/2019 ----------------------------------------- Fluid  bolus is completed.  Sepsis reassessment is been completed. Blood pressure 140/84, heart rate 90, respiratory rate 18.  Hemodynamically improved without any signs of shock.  ____________________________________________   FINAL CLINICAL IMPRESSION(S) / ED DIAGNOSES    Final diagnoses:  Acute pancreatitis, unspecified complication status, unspecified pancreatitis type  Sepsis, due to unspecified organism, unspecified whether acute organ dysfunction present [facility])     ED Discharge Orders    None      Portions of this note were generated with dragon dictation software. Dictation errors may occur despite best attempts at proofreading.   Carrie Mew, MD  12/05/19 1148    Carrie Mew, MD 12/05/19 1242

## 2019-12-05 NOTE — ED Notes (Signed)
Pt given phone to update family.

## 2019-12-05 NOTE — Progress Notes (Signed)
CODE SEPSIS - PHARMACY COMMUNICATION  **Broad Spectrum Antibiotics should be administered within 1 hour of Sepsis diagnosis**  Time Code Sepsis Called/Page Received: 0920  Antibiotics Ordered: cefepime/metronidazole  Time of 1st antibiotic administration: 1006  Additional action taken by pharmacy: NA  If necessary, Name of Provider/Nurse Contacted: NA    Tawnya Crook ,PharmD Clinical Pharmacist  12/05/2019  10:08 AM

## 2019-12-05 NOTE — Progress Notes (Signed)
PHARMACY -  BRIEF ANTIBIOTIC NOTE   Pharmacy has received consult(s) for Cefepime from an ED provider.  The patient's profile has been reviewed for ht/wt/allergies/indication/available labs.    One time order(s) placed for Cefepime 2g IV x 1 dose.  Further antibiotics/pharmacy consults should be ordered by admitting physician if indicated.                       Thank you, Pearla Dubonnet 12/05/2019  9:22 AM

## 2019-12-05 NOTE — H&P (Signed)
History and Physical    Noah Velasquez VZC:[id number] DOB: [birth year] DOA: 12/05/2019  PCP: Patient, No Pcp Per   Patient coming from: Home  I have personally briefly reviewed patient's old medical records in [place]  Chief Complaint: Abdominal pain  HPI: Noah Velasquez is a 54 y.o. male with medical history significant for chronic pancreatitis and hypertension to the emergency room by EMS for evaluation of left lower quadrant pain associated with nausea and vomiting that started 1 day prior to his admission.  Per patient he feels like this is an exacerbation of his known pancreatitis which he has had multiple times in the past.  Pain is constant, nonradiating rated 10 x 10 in intensity at its worst and associated with nausea and vomiting.  Pain is worse with eating and he denies having any alleviating factors. His last drink was last night. Lipase was elevated at 163 CT scan of abdomen pelvis showed subtle stranding adjacent to the tail of the pancreas suspicious for acute focal pancreatitis. However, this is less striking than on the 04/05/2019 exam. Chronic calcific pancreatitis with a dilated dorsal pancreatic duct terminating in a 2.0 cm cystic lesion or focal dilatation on image 26/2, but without formal interval growth since 04/05/2019. This could reflect chronic residua from inflammation, or intraductal papillary mucinous neoplasm. Surveillance imaging is suggested by pancreatic protocol CT or MRI in 2 years time.    ED Course: Patient presents for evaluation of abdominal pain and vomiting.  Initial tried vital signs shows the patient was hypotensive with systolic blood pressure of 31mHg.  Patient received 1 L IV fluid bolus.  He became febrile in the emergency room with T max of 100.28F and tachycardic and a code sepsis was initiated and patient received IV Flagyl and cefepime as well as sepsis fluid bolus.  He will be admitted to the hospital for further evaluation  Review  of Systems: As per HPI otherwise 10 point review of systems negative.    Past Medical History:  Diagnosis Date  . Chronic hip pain   . Chronic pancreatitis [place]   . CKD (chronic kidney disease) stage 3, GFR 30-59 ml/min   . GERD (gastroesophageal reflux disease)   . Hyperlipidemia   . Hypertension   . Osteoarthritis   . Pancreatitis   . Pancreatitis   . PTSD (post-traumatic stress disorder)   . Viral infection of left eye     Past Surgical History:  Procedure Laterality Date  . pancreatic stent placement       reports that he has been smoking cigarettes. He has a 3.75 pack-year smoking history. He has never used smokeless tobacco. He reports current alcohol use. He reports current drug use. Drug: Marijuana.  Allergies  Allergen Reactions  . Hctz [Hydrochlorothiazide] Swelling    Family History  Problem Relation Age of Onset  . Diabetes Mother   . Sleep apnea Mother   . Emphysema Sister   . CAD Neg Hx   . Mental illness Neg Hx      Prior to Admission medications   Medication Sig Start Date End Date Taking? Authorizing Provider  amLODipine (NORVASC) 10 MG tablet TAKE 1 TABLET BY MOUTH EVERY DAY 05/16/19   [employer] NP  buPROPion (WELLBUTRIN XL) 150 MG 24 hr tablet Take 1 tablet (150 mg total) by mouth daily. 04/14/19   [employer] NP  famotidine (PEPCID) 20 MG tablet TAKE 1 TABLET BY MOUTH TWICE A DAY 06/09/19   [employer]  NP  fluticasone (FLONASE) 50 MCG/ACT nasal spray Place 1 spray into both nostrils daily as needed for allergies or rhinitis. 10/11/18   [employer], NP  gabapentin (NEURONTIN) 100 MG capsule Take 1 capsule (100 mg total) by mouth at bedtime as needed (sleep). Patient not taking: Reported on 06/05/2019 05/09/18   [employer], NP  loratadine (CLARITIN) 10 MG tablet Take 1 tablet (10 mg total) by mouth daily. Patient not taking: Reported on 06/05/2019 10/11/18   [employer], NP  losartan  (COZAAR) 100 MG tablet Take 1 tablet (100 mg total) by mouth daily. 10/20/18   [employer], NP  nicotine (NICODERM CQ - DOSED IN MG/24 HOURS) 14 mg/24hr patch Place 1 patch (14 mg total) onto the skin daily. Patient not taking: Reported on 06/05/2019 04/08/19   Otila Back, MD  ondansetron (ZOFRAN ODT) 4 MG disintegrating tablet Take 1 tablet (4 mg total) by mouth every 8 (eight) hours as needed. 10/31/19   Gregor Hams, MD  oxyCODONE (OXY IR/ROXICODONE) 5 MG immediate release tablet Take 1 tablet (5 mg total) by mouth every 4 (four) hours as needed for severe pain. Patient not taking: Reported on 06/05/2019 04/07/19   Otila Back, MD  sertraline (ZOLOFT) 100 MG tablet TAKE 1 TABLET BY MOUTH EVERY DAY 11/07/19   Malfi, Lupita Raider, FNP  traZODone (DESYREL) 150 MG tablet Take 1 tablet (150 mg total) by mouth at bedtime as needed for sleep. 10/20/18   [employer], NP  ZENPEP [phone] units CPEP TAKE 1 CAPSULE BY MOUTH 3 (THREE) TIMES DAILY WITH MEALS. 04/10/19   [employer], NP  Zoster Vaccine Adjuvanted [facility]) injection Shingrix (PF) 50 mcg/0.5 mL intramuscular suspension, kit  TO BE ADMINISTERED BY PHARMACIST FOR IMMUNIZATION (2ND DOSE IN 2 TO 6 MONTHS)    [provider]    Physical Exam: Vitals:   12/05/19 0515 12/05/19 0518 12/05/19 0618 12/05/19 0751  BP: (!) 84/40 (!) 77/54 (!) 143/77 123/70  Pulse:  83 78 (!) 114  Resp:  18 20   Temp:  97.9 F (36.6 C)  (!) 100.9 F (38.3 C)  TempSrc:  Oral  Oral  SpO2:  100% 99% 94%  Weight: 83.5 kg     Height: '5\' 8"'$  (1.727 m)        Vitals:   12/05/19 0515 12/05/19 0518 12/05/19 0618 12/05/19 0751  BP: (!) 84/40 (!) 77/54 (!) 143/77 123/70  Pulse:  83 78 (!) 114  Resp:  18 20   Temp:  97.9 F (36.6 C)  (!) 100.9 F (38.3 C)  TempSrc:  Oral  Oral  SpO2:  100% 99% 94%  Weight: 83.5 kg     Height: '5\' 8"'$  (1.727 m)       Constitutional: NAD, alert and oriented x 3 Eyes: PERRL, lids and conjunctivae  normal ENMT: Mucous membranes are moist.  Neck: normal, supple, no masses, no thyromegaly Respiratory: clear to auscultation bilaterally, no wheezing, no crackles. Normal respiratory effort. No accessory muscle use.  Cardiovascular: Tachycardic, no murmurs / rubs / gallops. No extremity edema. 2+ pedal pulses. No carotid bruits.  Abdomen: Tenderness in left lower quadrant and Left upper quadrant, no masses palpated. No hepatosplenomegaly. Bowel sounds positive.  Musculoskeletal: no clubbing / cyanosis. No joint deformity upper and lower extremities.  Skin: no rashes, lesions, ulcers.  Neurologic: No gross focal neurologic deficit. Psychiatric: Normal mood and affect.   Labs on Admission: I have personally reviewed following labs and  imaging studies  CBC: Recent Labs  Lab 12/05/19 0527  WBC 8.5  NEUTROABS 7.5  HGB 13.6  HCT 40.2  MCV 95.0  PLT 287   Basic Metabolic Panel: Recent Labs  Lab 12/05/19 0527  NA 134*  K 3.9  CL 101  CO2 22  GLUCOSE 173*  BUN 10  CREATININE 1.08  CALCIUM 9.0   GFR: Estimated Creatinine Clearance: 82.3 mL/min (by C-G formula based on SCr of 1.08 mg/dL). Liver Function Tests: Recent Labs  Lab 12/05/19 0527  AST 21  ALT 16  ALKPHOS 77  BILITOT 1.0  PROT 8.5*  ALBUMIN 4.0   Recent Labs  Lab 12/05/19 0527  LIPASE 163*   No results for input(s): AMMONIA in the last 168 hours. Coagulation Profile: Recent Labs  Lab 12/05/19 1016  INR 1.3*   Cardiac Enzymes: No results for input(s): CKTOTAL, CKMB, CKMBINDEX, TROPONINI in the last 168 hours. BNP (last 3 results) No results for input(s): PROBNP in the last 8760 hours. HbA1C: No results for input(s): HGBA1C in the last 72 hours. CBG: No results for input(s): GLUCAP in the last 168 hours. Lipid Profile: No results for input(s): CHOL, HDL, LDLCALC, TRIG, CHOLHDL, LDLDIRECT in the last 72 hours. Thyroid Function Tests: No results for input(s): TSH, T4TOTAL, FREET4, T3FREE, THYROIDAB  in the last 72 hours. Anemia Panel: No results for input(s): VITAMINB12, FOLATE, FERRITIN, TIBC, IRON, RETICCTPCT in the last 72 hours. Urine analysis:    Component Value Date/Time   COLORURINE STRAW (A) 10/31/2019 1555   APPEARANCEUR CLEAR (A) 10/31/2019 1555   LABSPEC 1.008 10/31/2019 1555   PHURINE 5.0 10/31/2019 1555   GLUCOSEU NEGATIVE 10/31/2019 1555   HGBUR NEGATIVE 10/31/2019 1555   BILIRUBINUR NEGATIVE 10/31/2019 1555   BILIRUBINUR Negative 10/20/2018 1443   KETONESUR NEGATIVE 10/31/2019 1555   PROTEINUR NEGATIVE 10/31/2019 1555   UROBILINOGEN 0.2 10/20/2018 1443   NITRITE NEGATIVE 10/31/2019 1555   LEUKOCYTESUR NEGATIVE 10/31/2019 1555    Radiological Exams on Admission: CT ABDOMEN PELVIS W CONTRAST  Result Date: 12/05/2019 CLINICAL DATA:  Left lower abdominal pain with nausea and vomiting. EXAM: CT ABDOMEN AND PELVIS WITH CONTRAST TECHNIQUE: Multidetector CT imaging of the abdomen and pelvis was performed using the standard protocol following bolus administration of intravenous contrast. CONTRAST:  138m OMNIPAQUE IOHEXOL 300 MG/ML  SOLN COMPARISON:  04/05/2019 FINDINGS: Lower chest: The peripheral interstitial accentuation in the lung bases is mildly increased compared to prior. Some of this is more confluent dependently and probably represents component of dependent atelectasis given the crowding of vessels. Mild cardiomegaly. Descending thoracic aortic atherosclerosis. Small hiatal hernia. Adjacent lymph nodes near the gastroesophageal junction are similar to prior and measure up to 0.8 cm in short axis, significance uncertain. Right coronary artery atherosclerotic calcification. Hepatobiliary: Punctate calcifications along the dome the liver favoring old granulomatous disease. Gallbladder unremarkable. Pancreas: Considerable pancreatic calcifications favoring chronic calcific pancreatitis. Dilated dorsal pancreatic duct terminating in a 2.0 by 1.9 by 2.0 cm cystic lesion or  focal dilatation on image 26/2, by my measurements this was 1.9 by 1.1 by 1.3 cm on 04/29/2016 and more recently 2.0 by 1.7 by 2.0 cm on 04/05/2019. Accordingly there has been no formal ?interval growth? since 04/05/2019. This lesion could reflect chronic residua from inflammation, or intraductal papillary mucinous neoplasm. There is subtle stranding adjacent to the tail of the pancreas suspicious for acute focal pancreatitis. However, this is less striking than on the 04/05/2019 exam. Spleen: Unremarkable Adrenals/Urinary Tract: 1.1 by 1.2 by 1.7 cm  nodule of the left adrenal gland with portal venous phase density 75 Hounsfield units and delayed phase density 48 Hounsfield units yielding a relative washout of less than 40% which is indeterminate. This did not have a significant degree of dropout of signal on out of phase images the prior MRI from 05/06/2018 and also had a density of 19 Hounsfield units on precontrast images of 04/02/2017 accordingly remaining nonspecific. However, the lack of growth of this lesion compared to the earliest available comparison of 04/29/2016 strongly favors a benign etiology. Scattered scarring in both kidneys, right greater than left, similar pattern, distribution, appearance compared to 04/05/2019. 2 mm left kidney upper pole nonobstructive renal calculuson image 36/6. Stomach/Bowel: Small hiatal hernia. Normal appendix. Vascular/Lymphatic: Aortoiliac atherosclerotic vascular disease. Scattered small retroperitoneal lymph nodes are present. Right inguinal node 1.3 cm in short axis on image 81/2, formerly the same. Right external iliac node along the right inguinal ring, 0.8 cm in short axis on image 73/2. Reproductive: Unremarkable Other: Subtle presacral stranding is unchanged from the prior exam. Musculoskeletal: Very small umbilical hernia containing adipose tissue and a margin of a small bowel loop without complicating feature. Small indirect right inguinal hernia contains  adipose tissue. Markedly severe and asymmetric degenerative arthropathy of the right hip. Lumbar spondylosis, degenerative disc disease, and congenitally short pedicles contributing to suspected impingement at multiple lumbar levels. IMPRESSION: 1. Subtle stranding adjacent to the tail of the pancreas suspicious for acute focal pancreatitis. However, this is less striking than on the 04/05/2019 exam. 2. Chronic calcific pancreatitis with a dilated dorsal pancreatic duct terminating in a 2.0 cm cystic lesion or focal dilatation on image 26/2, but without formal interval growth since 04/05/2019. This could reflect chronic residua from inflammation, or intraductal papillary mucinous neoplasm. Surveillance imaging is suggested by pancreatic protocol CT or MRI in 2 years time. This recommendation follows ACR consensus guidelines: Management of Incidental Pancreatic Cysts: A White Paper of the ACR Incidental Findings Committee. J Am Coll Radiol 4193;[id number]. 3. Other imaging findings of potential clinical significance: Coronary atherosclerosis. Mild cardiomegaly. Small hiatal hernia. Small indirect right inguinal hernia containing adipose tissue. Markedly severe and asymmetric degenerative arthropathy of the right hip. Lumbar spondylosis, degenerative disc disease, and congenitally short pedicles contributing to suspected impingement at multiple lumbar levels. 4. 2 mm left kidney upper pole nonobstructive renal calculus. 5. Aortic atherosclerosis. Aortic Atherosclerosis (ICD10-I70.0). Electronically Signed   By: Van Clines M.D.   On: 12/05/2019 11:07    EKG: Independently reviewed.   Assessment/Plan Principal Problem:   Sepsis ([place]) Active Problems:   Acute on chronic pancreatitis (HCC)   Essential hypertension   Depression     Sepsis ([facility]) As evidenced by fever with a T-max of 100.21F, tachycardia and hypotension that responded to IV fluid resuscitation Lactic acid was 1.2.  White cell count is  normal but with a left shift Most likely intra-abdominal Continue IV fluid hydration Empiric antibiotic therapy with cefepime and Flagyl GI consult   Acute on chronic pancreatitis Patient n.p.o. Supportive care with pain control, antiemetics and IV PPI   Depression Patient is on multiple antidepressants which will be resumed during this hospitalization Continue Zoloft, bupropion and trazodone   Hypertension Hold losartan and amlodipine since patient is hypotensive.  DVT prophylaxis: Lovenox Code Status: Full Family Communication: Greater than patient's condition and plan of care with him at the bedside.  All questions and concerns have been addressed Disposition Plan: Back to previous home environment Consults called: GI    Rosemary Pentecost  Lynnda Wiersma MD [facility]     12/05/2019, 12:12 PM

## 2019-12-05 NOTE — Consult Note (Signed)
Noah Lame, MD [facility]  [address], [place], [place] [address] Phone: [phone] Fax : [phone]  Consultation  Referring Provider:     Dr. Eather Colas Primary Care Physician:  Patient, No Pcp Per Primary Gastroenterologist:  [facility]        Reason for Consultation:     Acute on chronic pancreatitis  Date of Admission:  12/05/2019 Date of Consultation:  12/05/2019         HPI:   Noah Velasquez is a 54 y.o. male Who has a long history of alcohol abuse and comes in with abdominal pain.  The patient states that he drinks approximately 6 beers a day.  He has had pancreatitis in the past and came in with abdominal discomfort. The patient had a CT scan showing possible pancreatitis in the tell of the pancreas and a 2 cm cystic lesion that appears to be the same size as a previous imaging on August 2020.  It was recommended that he have a repeat CT scan or MRI in 2 years to evaluate this lesion.  The patient had reported that his abdominal pain was mostly in the left upper quadrant and was associated with nausea and vomiting.  The symptoms had only been present for approximately 1 day prior to being admitted.  The patient's lipase was elevated at 168 one month ago and 163 on admission.The patient's liver enzymes were found to be normal.The patient also had a CBC that was normal.  The patient reports that his abdominal pain is slightly better than it was on admission.  He is not having any further nausea and vomiting.  The patient now states that he sees the urgency to quit drinking.  The patient was diagnosed with sepsis with a Maximum temperature of 100.9 tachycardia and hypotension and was treated with IV fluids. He was reported to have most likely source to be intra-abdominal by the hospitalist.  Past Medical History:  Diagnosis Date  . Chronic hip pain   . Chronic pancreatitis ([place])   . CKD (chronic kidney disease) stage 3, GFR 30-59 ml/min   . GERD (gastroesophageal reflux disease)    . Hyperlipidemia   . Hypertension   . Osteoarthritis   . Pancreatitis   . Pancreatitis   . PTSD (post-traumatic stress disorder)   . Viral infection of left eye     Past Surgical History:  Procedure Laterality Date  . pancreatic stent placement      Prior to Admission medications   Medication Sig Start Date End Date Taking? Authorizing Provider  amLODipine (NORVASC) 10 MG tablet TAKE 1 TABLET BY MOUTH EVERY DAY 05/16/19  Yes Mikey College, NP  buPROPion (WELLBUTRIN XL) 150 MG 24 hr tablet Take 1 tablet (150 mg total) by mouth daily. 04/14/19  Yes Mikey College, NP  famotidine (PEPCID) 20 MG tablet TAKE 1 TABLET BY MOUTH TWICE A DAY Patient taking differently: Take 20 mg by mouth 2 (two) times daily.  06/09/19  Yes Mikey College, NP  fluticasone [facility]) 50 MCG/ACT nasal spray Place 1 spray into both nostrils daily as needed for allergies or rhinitis. 10/11/18  Yes Mikey College, NP  sertraline (ZOLOFT) 100 MG tablet TAKE 1 TABLET BY MOUTH EVERY DAY 11/07/19  Yes Malfi, Lupita Raider, FNP  gabapentin (NEURONTIN) 100 MG capsule Take 1 capsule (100 mg total) by mouth at bedtime as needed (sleep). Patient not taking: Reported on 06/05/2019 05/09/18   Mikey College, NP  loratadine [facility])  10 MG tablet Take 1 tablet (10 mg total) by mouth daily. Patient not taking: Reported on 06/05/2019 10/11/18   Mikey College, NP  losartan (COZAAR) 100 MG tablet Take 1 tablet (100 mg total) by mouth daily. Patient not taking: Reported on 12/05/2019 10/20/18   Mikey College, NP  nicotine (NICODERM CQ - DOSED IN MG/24 HOURS) 14 mg/24hr patch Place 1 patch (14 mg total) onto the skin daily. Patient not taking: Reported on 06/05/2019 04/08/19   Otila Back, MD  ondansetron (ZOFRAN ODT) 4 MG disintegrating tablet Take 1 tablet (4 mg total) by mouth every 8 (eight) hours as needed. Patient not taking: Reported on 12/05/2019 10/31/19   Gregor Hams, MD  oxyCODONE  (OXY IR/ROXICODONE) 5 MG immediate release tablet Take 1 tablet (5 mg total) by mouth every 4 (four) hours as needed for severe pain. Patient not taking: Reported on 06/05/2019 04/07/19   Otila Back, MD  traZODone (DESYREL) 150 MG tablet Take 1 tablet (150 mg total) by mouth at bedtime as needed for sleep. Patient not taking: Reported on 12/05/2019 10/20/18   Mikey College, NP  ZENPEP 15000-47000 units CPEP TAKE 1 CAPSULE BY MOUTH 3 (THREE) TIMES DAILY WITH MEALS. Patient not taking: Reported on 12/05/2019 04/10/19   Mikey College, NP  Zoster Vaccine Adjuvanted [facility]) injection Shingrix (PF) 50 mcg/0.5 mL intramuscular suspension, kit  TO BE ADMINISTERED BY PHARMACIST FOR IMMUNIZATION (2ND DOSE IN 2 TO 6 MONTHS)    [provider]    Family History  Problem Relation Age of Onset  . Diabetes Mother   . Sleep apnea Mother   . Emphysema Sister   . CAD Neg Hx   . Mental illness Neg Hx      Social History   Tobacco Use  . Smoking status: Current Every Day Smoker    Packs/day: 0.25    Years: 15.00    Pack years: 3.75    Types: Cigarettes  . Smokeless tobacco: Never Used  Substance Use Topics  . Alcohol use: Yes    Comment: 80 ounces a week  . Drug use: Yes    Types: Marijuana    Allergies as of 12/05/2019 - Review Complete 12/05/2019  Allergen Reaction Noted  . Hctz [hydrochlorothiazide] Swelling 04/29/2016    Review of Systems:    All systems reviewed and negative except where noted in HPI.   Physical Exam:  Vital signs in last 24 hours: Temp:  [97.9 F (36.6 C)-100.9 F (38.3 C)] 98.6 F (37 C) (04/20 1320) Pulse Rate:  [78-114] 82 (04/20 1320) Resp:  [18-20] 20 (04/20 1320) BP: (77-143)/(40-90) 139/90 (04/20 1320) SpO2:  [94 %-100 %] 98 % (04/20 1320) Weight:  [83.5 kg] 83.5 kg (04/20 0515)   General:   Pleasant, cooperative in NAD Head:  Normocephalic and atraumatic. Eyes:   No icterus.   Conjunctiva pink. PERRLA. Ears:  Normal auditory  acuity. Neck:  Supple; no masses or thyroidomegaly Lungs: Respirations even and unlabored. Lungs clear to auscultation bilaterally.   No wheezes, crackles, or rhonchi.  Heart:  Regular rate and rhythm;  Without murmur, clicks, rubs or gallops Abdomen:  Soft, nondistended, Positive tenderness in the left upper quadrant. Normal bowel sounds. No appreciable masses or hepatomegaly.  No rebound or guarding.  Rectal:  Not performed. Msk:  Symmetrical without gross deformities.    Extremities:  Without edema, cyanosis or clubbing. Neurologic:  Alert and oriented x3;  grossly normal neurologically. Skin:  Intact without significant lesions  or rashes. Cervical Nodes:  No significant cervical adenopathy. Psych:  Alert and cooperative. Normal affect.  LAB RESULTS: Recent Labs    12/05/19 0527  WBC 8.5  HGB 13.6  HCT 40.2  PLT 231   BMET Recent Labs    12/05/19 0527  NA 134*  K 3.9  CL 101  CO2 22  GLUCOSE 173*  BUN 10  CREATININE 1.08  CALCIUM 9.0   LFT Recent Labs    12/05/19 0527  PROT 8.5*  ALBUMIN 4.0  AST 21  ALT 16  ALKPHOS 77  BILITOT 1.0   PT/INR Recent Labs    12/05/19 1016  LABPROT 15.6*  INR 1.3*    STUDIES: CT ABDOMEN PELVIS W CONTRAST  Result Date: 12/05/2019 CLINICAL DATA:  Left lower abdominal pain with nausea and vomiting. EXAM: CT ABDOMEN AND PELVIS WITH CONTRAST TECHNIQUE: Multidetector CT imaging of the abdomen and pelvis was performed using the standard protocol following bolus administration of intravenous contrast. CONTRAST:  18m OMNIPAQUE IOHEXOL 300 MG/ML  SOLN COMPARISON:  04/05/2019 FINDINGS: Lower chest: The peripheral interstitial accentuation in the lung bases is mildly increased compared to prior. Some of this is more confluent dependently and probably represents component of dependent atelectasis given the crowding of vessels. Mild cardiomegaly. Descending thoracic aortic atherosclerosis. Small hiatal hernia. Adjacent lymph nodes near the  gastroesophageal junction are similar to prior and measure up to 0.8 cm in short axis, significance uncertain. Right coronary artery atherosclerotic calcification. Hepatobiliary: Punctate calcifications along the dome the liver favoring old granulomatous disease. Gallbladder unremarkable. Pancreas: Considerable pancreatic calcifications favoring chronic calcific pancreatitis. Dilated dorsal pancreatic duct terminating in a 2.0 by 1.9 by 2.0 cm cystic lesion or focal dilatation on image 26/2, by my measurements this was 1.9 by 1.1 by 1.3 cm on 04/29/2016 and more recently 2.0 by 1.7 by 2.0 cm on 04/05/2019. Accordingly there has been no formal ?interval growth? since 04/05/2019. This lesion could reflect chronic residua from inflammation, or intraductal papillary mucinous neoplasm. There is subtle stranding adjacent to the tail of the pancreas suspicious for acute focal pancreatitis. However, this is less striking than on the 04/05/2019 exam. Spleen: Unremarkable Adrenals/Urinary Tract: 1.1 by 1.2 by 1.7 cm nodule of the left adrenal gland with portal venous phase density 75 Hounsfield units and delayed phase density 48 Hounsfield units yielding a relative washout of less than 40% which is indeterminate. This did not have a significant degree of dropout of signal on out of phase images the prior MRI from 05/06/2018 and also had a density of 19 Hounsfield units on precontrast images of 04/02/2017 accordingly remaining nonspecific. However, the lack of growth of this lesion compared to the earliest available comparison of 04/29/2016 strongly favors a benign etiology. Scattered scarring in both kidneys, right greater than left, similar pattern, distribution, appearance compared to 04/05/2019. 2 mm left kidney upper pole nonobstructive renal calculuson image 36/6. Stomach/Bowel: Small hiatal hernia. Normal appendix. Vascular/Lymphatic: Aortoiliac atherosclerotic vascular disease. Scattered small retroperitoneal lymph  nodes are present. Right inguinal node 1.3 cm in short axis on image 81/2, formerly the same. Right external iliac node along the right inguinal ring, 0.8 cm in short axis on image 73/2. Reproductive: Unremarkable Other: Subtle presacral stranding is unchanged from the prior exam. Musculoskeletal: Very small umbilical hernia containing adipose tissue and a margin of a small bowel loop without complicating feature. Small indirect right inguinal hernia contains adipose tissue. Markedly severe and asymmetric degenerative arthropathy of the right hip. Lumbar spondylosis, degenerative disc  disease, and congenitally short pedicles contributing to suspected impingement at multiple lumbar levels. IMPRESSION: 1. Subtle stranding adjacent to the tail of the pancreas suspicious for acute focal pancreatitis. However, this is less striking than on the 04/05/2019 exam. 2. Chronic calcific pancreatitis with a dilated dorsal pancreatic duct terminating in a 2.0 cm cystic lesion or focal dilatation on image 26/2, but without formal interval growth since 04/05/2019. This could reflect chronic residua from inflammation, or intraductal papillary mucinous neoplasm. Surveillance imaging is suggested by pancreatic protocol CT or MRI in 2 years time. This recommendation follows ACR consensus guidelines: Management of Incidental Pancreatic Cysts: A White Paper of the ACR Incidental Findings Committee. J Am Coll Radiol 8657;84:696-295. 3. Other imaging findings of potential clinical significance: Coronary atherosclerosis. Mild cardiomegaly. Small hiatal hernia. Small indirect right inguinal hernia containing adipose tissue. Markedly severe and asymmetric degenerative arthropathy of the right hip. Lumbar spondylosis, degenerative disc disease, and congenitally short pedicles contributing to suspected impingement at multiple lumbar levels. 4. 2 mm left kidney upper pole nonobstructive renal calculus. 5. Aortic atherosclerosis. Aortic  Atherosclerosis (ICD10-I70.0). Electronically Signed   By: Van Clines M.D.   On: 12/05/2019 11:07      Impression / Plan:   Assessment: Principal Problem:   Sepsis ([place]) Active Problems:   Acute on chronic pancreatitis [facility])   Essential hypertension   Depression   Shayaan Parke is a 54 y.o. y/o male with A diagnosis of pancreatitis on admission with a CT scan showing " Subtle stranding adjacent to the tell the pancreas suspicious for acute focal pancreatitis.  However, this is less striking than 04/05/2019 exam".  There is no report on the CT scan of any ascites or intra-abdominal fluid collection suggestive of her tinnitus.  The patient's Lipase is the same value it was one month ago.His white cell count is also normal.  Plan:  Although this patient has been diagnosed with sepsis and reported to have a intra-abdominal source of the sepsis to be most likely, I see no findings suggestive of any perforation, fluid collection, air-filled abscess to support an intra-abdominal source of this patient's sepsis. It appears that the patient has calcifications of his pancreas consistent with chronic pancreatitis with a mild case of acute on chronic pancreatitis.  The patient does have a cystic lesion that will need follow-up with a repeat imaging in 2 years.  The patient has been told that he should stop his alcohol intake and I recommend to treat him conservatively for his mild acute pancreatitis.  Thank you for involving me in the care of this patient.      LOS: 0 days   Noah Lame, MD  12/05/2019, 4:07 PM Pager [phone] 7am-5pm  Check AMION for 5pm -7am coverage and on weekends   Note: This dictation was prepared with Dragon dictation along with smaller phrase technology. Any transcriptional errors that result from this process are unintentional.

## 2019-12-05 NOTE — ED Notes (Signed)
Blood cultures x 2 collected by this RN at 10:04am

## 2019-12-06 ENCOUNTER — Inpatient Hospital Stay: Payer: Medicaid Other

## 2019-12-06 DIAGNOSIS — K8689 Other specified diseases of pancreas: Secondary | ICD-10-CM

## 2019-12-06 DIAGNOSIS — A419 Sepsis, unspecified organism: Secondary | ICD-10-CM

## 2019-12-06 DIAGNOSIS — F17210 Nicotine dependence, cigarettes, uncomplicated: Secondary | ICD-10-CM

## 2019-12-06 DIAGNOSIS — F329 Major depressive disorder, single episode, unspecified: Secondary | ICD-10-CM

## 2019-12-06 DIAGNOSIS — R7881 Bacteremia: Secondary | ICD-10-CM

## 2019-12-06 DIAGNOSIS — Z79899 Other long term (current) drug therapy: Secondary | ICD-10-CM

## 2019-12-06 DIAGNOSIS — I10 Essential (primary) hypertension: Secondary | ICD-10-CM

## 2019-12-06 DIAGNOSIS — K861 Other chronic pancreatitis: Secondary | ICD-10-CM

## 2019-12-06 DIAGNOSIS — M879 Osteonecrosis, unspecified: Secondary | ICD-10-CM

## 2019-12-06 DIAGNOSIS — K860 Alcohol-induced chronic pancreatitis: Secondary | ICD-10-CM

## 2019-12-06 DIAGNOSIS — K858 Other acute pancreatitis without necrosis or infection: Secondary | ICD-10-CM

## 2019-12-06 DIAGNOSIS — Z888 Allergy status to other drugs, medicaments and biological substances status: Secondary | ICD-10-CM

## 2019-12-06 DIAGNOSIS — B9620 Unspecified Escherichia coli [E. coli] as the cause of diseases classified elsewhere: Secondary | ICD-10-CM

## 2019-12-06 LAB — PROTIME-INR
INR: 1.2 (ref 0.8–1.2)
Prothrombin Time: 14.9 seconds (ref 11.4–15.2)

## 2019-12-06 LAB — BASIC METABOLIC PANEL
Anion gap: 8 (ref 5–15)
BUN: 9 mg/dL (ref 6–20)
CO2: 24 mmol/L (ref 22–32)
Calcium: 8.7 mg/dL — ABNORMAL LOW (ref 8.9–10.3)
Chloride: 105 mmol/L (ref 98–111)
Creatinine, Ser: 0.93 mg/dL (ref 0.61–1.24)
GFR calc Af Amer: >60 mL/min (ref >60)
GFR calc non Af Amer: >60 mL/min (ref >60)
Glucose, Bld: 100 mg/dL — ABNORMAL HIGH (ref 70–99)
Potassium: 3.6 mmol/L (ref 3.5–5.1)
Sodium: 137 mmol/L (ref 135–145)

## 2019-12-06 LAB — BLOOD CULTURE ID PANEL (REFLEXED)

## 2019-12-06 LAB — CBC
HCT: 38.1 % — ABNORMAL LOW (ref 39.0–52.0)
Hemoglobin: 12.7 g/dL — ABNORMAL LOW (ref 13.0–17.0)
MCH: 32.2 pg (ref 26.0–34.0)
MCHC: 33.3 g/dL (ref 30.0–36.0)
MCV: 96.5 fL (ref 80.0–100.0)
Platelets: 186 10*3/uL (ref 150–400)
RBC: 3.95 MIL/uL — ABNORMAL LOW (ref 4.22–5.81)
RDW: 13.3 % (ref 11.5–15.5)
WBC: 17.7 10*3/uL — ABNORMAL HIGH (ref 4.0–10.5)
nRBC: 0 % (ref 0.0–0.2)

## 2019-12-06 LAB — PROCALCITONIN: Procalcitonin: 20.08 ng/mL

## 2019-12-06 LAB — URINE CULTURE: Culture: NO GROWTH

## 2019-12-06 LAB — CORTISOL-AM, BLOOD: Cortisol - AM: 18.2 ug/dL (ref 6.7–22.6)

## 2019-12-06 MED ORDER — SODIUM CHLORIDE 0.9 % IV SOLN
500.0000 mg | Freq: Three times a day (TID) | INTRAVENOUS | Status: DC
  Filled 2019-12-06 (×2): qty 0.5

## 2019-12-06 MED ORDER — FOLIC ACID 5 MG/ML IJ SOLN
1.0000 mg | Freq: Every day | INTRAMUSCULAR | Status: DC
  Administered 2019-12-06 – 2019-12-08 (×3): 1 mg via INTRAVENOUS
  Filled 2019-12-06 (×4): qty 0.2

## 2019-12-06 MED ORDER — AMLODIPINE BESYLATE 10 MG PO TABS
10.0000 mg | ORAL_TABLET | Freq: Every day | ORAL | Status: DC
  Administered 2019-12-06 – 2019-12-09 (×4): 10 mg via ORAL
  Filled 2019-12-06 (×4): qty 1

## 2019-12-06 MED ORDER — THIAMINE HCL 100 MG/ML IJ SOLN
100.0000 mg | Freq: Every day | INTRAMUSCULAR | Status: DC
  Administered 2019-12-06 – 2019-12-09 (×4): 100 mg via INTRAVENOUS
  Filled 2019-12-06 (×4): qty 2

## 2019-12-06 MED ORDER — LACTATED RINGERS IV SOLN: INTRAVENOUS | Status: DC

## 2019-12-06 MED ORDER — SODIUM CHLORIDE 0.9 % IV SOLN
1.0000 g | Freq: Three times a day (TID) | INTRAVENOUS | Status: AC
  Administered 2019-12-06 – 2019-12-08 (×8): 1 g via INTRAVENOUS
  Filled 2019-12-06 (×9): qty 1

## 2019-12-06 NOTE — Progress Notes (Signed)
PHARMACY NOTE:  ANTIMICROBIAL RENAL DOSAGE ADJUSTMENT  Current antimicrobial regimen includes a mismatch between antimicrobial dosage and estimated renal function.  As per policy approved by the Pharmacy & Therapeutics and Medical Executive Committees, the antimicrobial dosage will be adjusted accordingly.  Current antimicrobial dosage:  Meropenem 500 mg Q8H  Indication: Bacteremia  Renal Function: CrCl >50  mL/min   Estimated Creatinine Clearance: 84.9 mL/min (by C-G formula based on SCr of 0.93 mg/dL).     Antimicrobial dosage has been changed to:  Meropenem 1g Q8h   Thank you for allowing pharmacy to be a part of this patient's care.  Rowland Lathe, [facility] 12/06/2019 10:45 AM

## 2019-12-06 NOTE — Progress Notes (Signed)
PHARMACY - PHYSICIAN COMMUNICATION CRITICAL VALUE ALERT - BLOOD CULTURE IDENTIFICATION (BCID)  Noah Velasquez is an 54 y.o. male who presented to [facility] on 12/05/2019 with a chief complaint of abdominal pain.  Assessment:  1/4 bottles(aerobic), GNR, E.coli. Presently afebrile (febrile on admission with Tmax 100.4F), WBC 17.7 (up from yesterday), LA 1.2 and PCT 20.8. Additionally, no recent hospitalizations/ antibiotics, h/o ESBL, or corticosteroids.   Name of physician (or Provider) Contacted: Dr. Roosevelt Locks  Current antibiotics: Cefepime 2g Q8H and metronidazole.   Changes to prescribed antibiotics recommended: CTX 2g Q24H Provider ordered Meropenem 500 mg Q8H    Results for orders placed or performed during the hospital encounter of 12/05/19  Blood Culture ID Panel (Reflexed) (Collected: 12/05/2019 10:16 AM)  Result Value Ref Range   Enterococcus species NOT DETECTED NOT DETECTED   Listeria monocytogenes NOT DETECTED NOT DETECTED   Staphylococcus species NOT DETECTED NOT DETECTED   Staphylococcus aureus (BCID) NOT DETECTED NOT DETECTED   Streptococcus species NOT DETECTED NOT DETECTED   Streptococcus agalactiae NOT DETECTED NOT DETECTED   Streptococcus pneumoniae NOT DETECTED NOT DETECTED   Streptococcus pyogenes NOT DETECTED NOT DETECTED   Acinetobacter baumannii NOT DETECTED NOT DETECTED   Enterobacteriaceae species DETECTED (A) NOT DETECTED   Enterobacter cloacae complex NOT DETECTED NOT DETECTED   Escherichia coli DETECTED (A) NOT DETECTED   Klebsiella oxytoca NOT DETECTED NOT DETECTED   Klebsiella pneumoniae NOT DETECTED NOT DETECTED   Proteus species NOT DETECTED NOT DETECTED   Serratia marcescens NOT DETECTED NOT DETECTED   Carbapenem resistance NOT DETECTED NOT DETECTED   Haemophilus influenzae NOT DETECTED NOT DETECTED   Neisseria meningitidis NOT DETECTED NOT DETECTED   Pseudomonas aeruginosa NOT DETECTED NOT DETECTED   Candida albicans NOT DETECTED NOT DETECTED   Candida glabrata NOT DETECTED NOT DETECTED   Candida krusei NOT DETECTED NOT DETECTED   Candida parapsilosis NOT DETECTED NOT DETECTED   Candida tropicalis NOT DETECTED NOT DETECTED    Rowland Lathe 12/06/2019  8:33 AM

## 2019-12-06 NOTE — Plan of Care (Signed)
  Problem: Clinical Measurements: Goal: Respiratory complications will improve Outcome: Progressing   Problem: Safety: Goal: Ability to remain free from injury will improve Outcome: Progressing   Problem: Pain Managment: Goal: General experience of comfort will improve Outcome: Progressing

## 2019-12-06 NOTE — Progress Notes (Signed)
PROGRESS NOTE    Kerney Hopfensperger  YDX:[id number] DOB: Feb 26, 1966 DOA: 12/05/2019 PCP: Patient, No Pcp Per (Confirm with patient/family/NH records and if not entered, this HAS to be entered at [facility] point of entry. "No PCP" if truly none.)   Brief Narrative:   Patient is a 54 year old male with a history of chronic alcoholism, chronic pancreatitis who came to the hospital complaining of abdominal pain. 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  At time of admission, patient lipase was not elevated. But he had a fever, hypotension. Received IV fluid bolus. He was also placed on broad-spectrum antibiotics with cefepime and Flagyl. CT scan of abdomen/pelvis with IV contrast showed a stranding adjacent to the pancreatic tail, chronic pancreatitis with 2 cm cystic lesion, no change since August 2020.  2 mm nonobstructive renal calculus at the left upper pole.   Assessment & Plan:   Principal Problem:   Sepsis ([place]) Active Problems:   Acute on chronic pancreatitis ([place])   Essential hypertension   Depression  #1.  Sepsis.  Patient had a significant fever, tachycardia and hypotension, condition had improved since giving IV fluid bolus.  Lactic acid level 1.2.  Patient was treated with cefepime and Flagyl. Patient appears to have worsening leukocytosis today.  Procalcitonin level had increased to 20.08.  1/4 blood culture was positive for E. coli and Enterobacter species, final results still pending.  I will change antibiotic to meropenem and Flagyl, pending final culture results.  Obtain consult from infectious disease.  Appreciated GI consult.  Patient did not appear to have any acute complication from pancreatitis. Obtain right upper quadrant ultrasound to evaluate the gallbladder for cholecystitis.  #2.  Acute on chronic alcoholic pancreatitis.  Continue IV fluids and symptomatic treatment.  Continue pain control.  Monitor BMP and magnesium level.  Currently patient has no evidence of alcohol  withdrawal.  Also added thiamine and folic acid.  #3.  Essential hypertension.  Hold blood pressure medicines due to hypotension.   DVT prophylaxis: Lovenox Code Status: Full Family Communication: I had a long discussion with the patient, all questions answered. Disposition Plan:  . Patient came from:            . Anticipated d/c place: . Barriers to d/c OR conditions which need to be met to effect a safe d/c:   Consultants:  Gastroenterology Infectious disease  Procedures: (Don't include imaging studies which can be auto populated. Include things that cannot be auto populated i.e. Echo, Carotid and venous dopplers, Foley, Bipap, HD, tubes/drains, wound vac, central lines etc)  None  Antimicrobials: (specify start and planned stop date. Auto populated tables are space occupying and do not give end dates)  Meropenem and Flagyl   Subjective: Patient still has significant pain in the stomach, localized to the left side.  No nausea vomiting.  Fever has been better this morning. Denies any short of breath or cough.  No dysuria hematuria.  Objective: Vitals:   12/05/19 2230 12/05/19 2333 12/06/19 0351 12/06/19 0823  BP:  140/89 (!) 140/93 (!) 157/92  Pulse: 80 84 94 95  Resp: 19   19  Temp:  97.9 F (36.6 C) 98 F (36.7 C) 98.1 F (36.7 C)  TempSrc:  Oral Oral Oral  SpO2: 96% 99% 99% 99%  Weight:  76.2 kg 76.3 kg   Height:  _0  (1.702 m)      Intake/Output Summary (Last 24 hours) at 12/06/2019 0951 Last data filed at 12/06/2019 0827 Gross per 24 hour  Intake 1230.46 ml  Output 2700 ml  Net -1469.54 ml   Filed Weights   12/05/19 0515 12/05/19 2333 12/06/19 0351  Weight: 83.5 kg 76.2 kg 76.3 kg    Examination:  General exam: Appears calm and comfortable  Respiratory system: Clear to auscultation. Respiratory effort normal. Cardiovascular system: S1 & S2 heard, RRR. No JVD, murmurs, rubs, gallops or clicks. No pedal edema. Gastrointestinal system: Abdomen is  nondistended, soft.  Tenderness in the right upper quadrant and the left abdomen.  No organomegaly or masses felt. Normal bowel sounds heard. Central nervous system: Alert and oriented. No focal neurological deficits. Extremities: Symmetric 5 x 5 power. Skin: No rashes, lesions or ulcers Psychiatry: Judgement and insight appear normal. Mood & affect appropriate.     Data Reviewed: I have personally reviewed following labs and imaging studies  CBC: Recent Labs  Lab 12/05/19 0527 12/06/19 0421  WBC 8.5 17.7*  NEUTROABS 7.5  --   HGB 13.6 12.7*  HCT 40.2 38.1*  MCV 95.0 96.5  PLT 231 485   Basic Metabolic Panel: Recent Labs  Lab 12/05/19 0527 12/06/19 0421  NA 134* 137  K 3.9 3.6  CL 101 105  CO2 22 24  GLUCOSE 173* 100*  BUN 10 9  CREATININE 1.08 0.93  CALCIUM 9.0 8.7*   GFR: Estimated Creatinine Clearance: 84.9 mL/min (by C-G formula based on SCr of 0.93 mg/dL). Liver Function Tests: Recent Labs  Lab 12/05/19 0527  AST 21  ALT 16  ALKPHOS 77  BILITOT 1.0  PROT 8.5*  ALBUMIN 4.0   Recent Labs  Lab 12/05/19 0527  LIPASE 163*   No results for input(s): AMMONIA in the last 168 hours. Coagulation Profile: Recent Labs  Lab 12/05/19 1016 12/06/19 0421  INR 1.3* 1.2   Cardiac Enzymes: No results for input(s): CKTOTAL, CKMB, CKMBINDEX, TROPONINI in the last 168 hours. BNP (last 3 results) No results for input(s): PROBNP in the last 8760 hours. HbA1C: No results for input(s): HGBA1C in the last 72 hours. CBG: No results for input(s): GLUCAP in the last 168 hours. Lipid Profile: No results for input(s): CHOL, HDL, LDLCALC, TRIG, CHOLHDL, LDLDIRECT in the last 72 hours. Thyroid Function Tests: No results for input(s): TSH, T4TOTAL, FREET4, T3FREE, THYROIDAB in the last 72 hours. Anemia Panel: No results for input(s): VITAMINB12, FOLATE, FERRITIN, TIBC, IRON, RETICCTPCT in the last 72 hours. Sepsis Labs: Recent Labs  Lab 12/05/19 0527 12/05/19 1017  12/06/19 0421  PROCALCITON 0.45  --  20.08  LATICACIDVEN  --  1.2  --     Recent Results (from the past 240 hour(s))  Blood Culture (routine x 2)     Status: None (Preliminary result)   Collection Time: 12/05/19 10:16 AM   Specimen: BLOOD  Result Value Ref Range Status   Specimen Description BLOOD BLOOD LEFT WRIST  Final   Special Requests   Final    BOTTLES DRAWN AEROBIC AND ANAEROBIC Blood Culture adequate volume   Culture  Setup Time   Final    Organism ID to follow GRAM NEGATIVE RODS AEROBIC BOTTLE ONLY CRITICAL RESULT CALLED TO, READ BACK BY AND VERIFIED WITH: Rito Ehrlich AT 4627 ON 12/06/19 SNG Performed at [facility], [address], [place], [place] [address]    Culture GRAM NEGATIVE RODS  Final   Report Status PENDING  Incomplete  Blood Culture ID Panel (Reflexed)     Status: Abnormal   Collection Time: 12/05/19 10:16 AM  Result Value Ref Range Status  Enterococcus species NOT DETECTED NOT DETECTED Final   Listeria monocytogenes NOT DETECTED NOT DETECTED Final   Staphylococcus species NOT DETECTED NOT DETECTED Final   Staphylococcus aureus (BCID) NOT DETECTED NOT DETECTED Final   Streptococcus species NOT DETECTED NOT DETECTED Final   Streptococcus agalactiae NOT DETECTED NOT DETECTED Final   Streptococcus pneumoniae NOT DETECTED NOT DETECTED Final   Streptococcus pyogenes NOT DETECTED NOT DETECTED Final   Acinetobacter baumannii NOT DETECTED NOT DETECTED Final   Enterobacteriaceae species DETECTED (A) NOT DETECTED Final    Comment: Enterobacteriaceae represent a large family of gram-negative bacteria, not a single organism. CRITICAL RESULT CALLED TO, READ BACK BY AND VERIFIED WITH: WALID NAZARI AT 0831 ON 12/06/19 SNG    Enterobacter cloacae complex NOT DETECTED NOT DETECTED Final   Escherichia coli DETECTED (A) NOT DETECTED Final    Comment: CRITICAL RESULT CALLED TO, READ BACK BY AND VERIFIED WITH: WALID NAZARI AT 0831 ON 12/06/19 SNG    Klebsiella  oxytoca NOT DETECTED NOT DETECTED Final   Klebsiella pneumoniae NOT DETECTED NOT DETECTED Final   Proteus species NOT DETECTED NOT DETECTED Final   Serratia marcescens NOT DETECTED NOT DETECTED Final   Carbapenem resistance NOT DETECTED NOT DETECTED Final   Haemophilus influenzae NOT DETECTED NOT DETECTED Final   Neisseria meningitidis NOT DETECTED NOT DETECTED Final   Pseudomonas aeruginosa NOT DETECTED NOT DETECTED Final   Candida albicans NOT DETECTED NOT DETECTED Final   Candida glabrata NOT DETECTED NOT DETECTED Final   Candida krusei NOT DETECTED NOT DETECTED Final   Candida parapsilosis NOT DETECTED NOT DETECTED Final   Candida tropicalis NOT DETECTED NOT DETECTED Final    Comment: Performed at [facility], [address] [place], [place] [address]  Blood Culture (routine x 2)     Status: None (Preliminary result)   Collection Time: 12/05/19 10:17 AM   Specimen: BLOOD  Result Value Ref Range Status   Specimen Description BLOOD LEFT ANTECUBITAL  Final   Special Requests   Final    BOTTLES DRAWN AEROBIC AND ANAEROBIC Blood Culture adequate volume   Culture   Final    NO GROWTH < 24 HOURS Performed at [facility], [address] [place], [place] [address]    Report Status PENDING  Incomplete  Respiratory Panel by RT PCR (Flu A&B, Covid) - Nasopharyngeal Swab     Status: None   Collection Time: 12/05/19 10:17 AM   Specimen: Nasopharyngeal Swab  Result Value Ref Range Status   SARS Coronavirus 2 by RT PCR NEGATIVE NEGATIVE Final    Comment: (NOTE) SARS-CoV-2 target nucleic acids are NOT DETECTED. The SARS-CoV-2 RNA is generally detectable in upper respiratoy specimens during the acute phase of infection. The lowest concentration of SARS-CoV-2 viral copies this assay can detect is 131 copies/mL. A negative result does not preclude SARS-Cov-2 infection and should not be used as the sole basis for treatment or other patient management decisions. A negative  result may occur with  improper specimen collection/handling, submission of specimen other than nasopharyngeal swab, presence of viral mutation(s) within the areas targeted by this assay, and inadequate number of viral copies (<131 copies/mL). A negative result must be combined with clinical observations, patient history, and epidemiological information. The expected result is Negative. Fact Sheet for Patients:  PinkCheek.be Fact Sheet for Healthcare Providers:  GravelBags.it This test is not yet ap proved or cleared by the [place] FDA and  has been authorized for detection and/or diagnosis of SARS-CoV-2 by FDA  under an Emergency Use Authorization (EUA). This EUA will remain  in effect (meaning this test can be used) for the duration of the COVID-19 declaration under Section 564(b)(1) of the Act, 21 U.S.C. section 360bbb-3(b)(1), unless the authorization is terminated or revoked sooner.    Influenza A by PCR NEGATIVE NEGATIVE Final   Influenza B by PCR NEGATIVE NEGATIVE Final    Comment: (NOTE) The Xpert Xpress SARS-CoV-2/FLU/RSV assay is intended as an aid in  the diagnosis of influenza from Nasopharyngeal swab specimens and  should not be used as a sole basis for treatment. Nasal washings and  aspirates are unacceptable for Xpert Xpress SARS-CoV-2/FLU/RSV  testing. Fact Sheet for Patients: PinkCheek.be Fact Sheet for Healthcare Providers: GravelBags.it This test is not yet approved or cleared by the [place] FDA and  has been authorized for detection and/or diagnosis of SARS-CoV-2 by  FDA under an Emergency Use Authorization (EUA). This EUA will remain  in effect (meaning this test can be used) for the duration of the  Covid-19 declaration under Section 564(b)(1) of the Act, 21  U.S.C. section 360bbb-3(b)(1), unless the authorization is  terminated or  revoked. Performed at [facility], [address], [place], [place] [address]          Radiology Studies: CT ABDOMEN PELVIS W CONTRAST  Result Date: 12/05/2019 CLINICAL DATA:  Left lower abdominal pain with nausea and vomiting. EXAM: CT ABDOMEN AND PELVIS WITH CONTRAST TECHNIQUE: Multidetector CT imaging of the abdomen and pelvis was performed using the standard protocol following bolus administration of intravenous contrast. CONTRAST:  122m OMNIPAQUE IOHEXOL 300 MG/ML  SOLN COMPARISON:  04/05/2019 FINDINGS: Lower chest: The peripheral interstitial accentuation in the lung bases is mildly increased compared to prior. Some of this is more confluent dependently and probably represents component of dependent atelectasis given the crowding of vessels. Mild cardiomegaly. Descending thoracic aortic atherosclerosis. Small hiatal hernia. Adjacent lymph nodes near the gastroesophageal junction are similar to prior and measure up to 0.8 cm in short axis, significance uncertain. Right coronary artery atherosclerotic calcification. Hepatobiliary: Punctate calcifications along the dome the liver favoring old granulomatous disease. Gallbladder unremarkable. Pancreas: Considerable pancreatic calcifications favoring chronic calcific pancreatitis. Dilated dorsal pancreatic duct terminating in a 2.0 by 1.9 by 2.0 cm cystic lesion or focal dilatation on image 26/2, by my measurements this was 1.9 by 1.1 by 1.3 cm on 04/29/2016 and more recently 2.0 by 1.7 by 2.0 cm on 04/05/2019. Accordingly there has been no formal ?interval growth? since 04/05/2019. This lesion could reflect chronic residua from inflammation, or intraductal papillary mucinous neoplasm. There is subtle stranding adjacent to the tail of the pancreas suspicious for acute focal pancreatitis. However, this is less striking than on the 04/05/2019 exam. Spleen: Unremarkable Adrenals/Urinary Tract: 1.1 by 1.2 by 1.7 cm nodule of the left adrenal  gland with portal venous phase density 75 Hounsfield units and delayed phase density 48 Hounsfield units yielding a relative washout of less than 40% which is indeterminate. This did not have a significant degree of dropout of signal on out of phase images the prior MRI from 05/06/2018 and also had a density of 19 Hounsfield units on precontrast images of 04/02/2017 accordingly remaining nonspecific. However, the lack of growth of this lesion compared to the earliest available comparison of 04/29/2016 strongly favors a benign etiology. Scattered scarring in both kidneys, right greater than left, similar pattern, distribution, appearance compared to 04/05/2019. 2 mm left kidney upper pole nonobstructive renal calculuson image 36/6. Stomach/Bowel: Small  hiatal hernia. Normal appendix. Vascular/Lymphatic: Aortoiliac atherosclerotic vascular disease. Scattered small retroperitoneal lymph nodes are present. Right inguinal node 1.3 cm in short axis on image 81/2, formerly the same. Right external iliac node along the right inguinal ring, 0.8 cm in short axis on image 73/2. Reproductive: Unremarkable Other: Subtle presacral stranding is unchanged from the prior exam. Musculoskeletal: Very small umbilical hernia containing adipose tissue and a margin of a small bowel loop without complicating feature. Small indirect right inguinal hernia contains adipose tissue. Markedly severe and asymmetric degenerative arthropathy of the right hip. Lumbar spondylosis, degenerative disc disease, and congenitally short pedicles contributing to suspected impingement at multiple lumbar levels. IMPRESSION: 1. Subtle stranding adjacent to the tail of the pancreas suspicious for acute focal pancreatitis. However, this is less striking than on the 04/05/2019 exam. 2. Chronic calcific pancreatitis with a dilated dorsal pancreatic duct terminating in a 2.0 cm cystic lesion or focal dilatation on image 26/2, but without formal interval growth since  04/05/2019. This could reflect chronic residua from inflammation, or intraductal papillary mucinous neoplasm. Surveillance imaging is suggested by pancreatic protocol CT or MRI in 2 years time. This recommendation follows ACR consensus guidelines: Management of Incidental Pancreatic Cysts: A White Paper of the ACR Incidental Findings Committee. J Am Coll Radiol 2423;53:614-431. 3. Other imaging findings of potential clinical significance: Coronary atherosclerosis. Mild cardiomegaly. Small hiatal hernia. Small indirect right inguinal hernia containing adipose tissue. Markedly severe and asymmetric degenerative arthropathy of the right hip. Lumbar spondylosis, degenerative disc disease, and congenitally short pedicles contributing to suspected impingement at multiple lumbar levels. 4. 2 mm left kidney upper pole nonobstructive renal calculus. 5. Aortic atherosclerosis. Aortic Atherosclerosis (ICD10-I70.0). Electronically Signed   By: Van Clines M.D.   On: 12/05/2019 11:07        Scheduled Meds: . buPROPion  150 mg Oral Daily  . enoxaparin (LOVENOX) injection  40 mg Subcutaneous Q24H  . loratadine  10 mg Oral Daily  . LORazepam  0-4 mg Intravenous Q6H   Followed by  . [START ON 12/07/2019] LORazepam  0-4 mg Intravenous Q12H  . nicotine  14 mg Transdermal Daily  . pantoprazole (PROTONIX) IV  40 mg Intravenous Daily  . sertraline  100 mg Oral Daily   Continuous Infusions: . ceFEPime (MAXIPIME) IV 2 g (12/06/19 0534)  . metronidazole 500 mg (12/06/19 0534)     LOS: 1 day    Time spent: 45 minutes    Sharen Hones, MD [facility]   To contact the attending provider between 7A-7P or the covering provider during after hours 7P-7A, please log into the web site [email or web address] and access using universal  password for that web site. If you do not have the password, please call the hospital operator.  12/06/2019, 9:51 AM

## 2019-12-06 NOTE — Progress Notes (Addendum)
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 During April 2014 he had acute pancreatitis with with complications of a pancreatic duct leak leading to pancreatic ascites and pancreatic pseudocyst.  A cystogastrostomy with axial stent placement was performed at [facility].  He later then underwent ERCP on 02/02/2013 with a 5 French 9 cm stent placement which eventually was removed.   Unable to see those records from [facility]. Since then patient has had multiple hospitalizations for pancreatitis the last being on 04/05/2019. Patient also has right hip avascular necrosis and at one time was scheduled to have a hip replacement but that did not materialize because of his pancreatitis. He has chronic calcification in the pancreas. During this hospitalization he has E. coli bacteremia and I am asked to see the patient for the same. Denies any fever, chills.  Has baseline cough and he is a smoker 10 cigarettes/day.  He has abdominal pain and also had nausea vomiting.  The abdominal pain is over the left upper quadrant as well as the epigastric area and around the umbilical area. He does not have dysuria or hematuria. Patient lives on his own.  Past medical history Right hip avascular necrosis CKD GERD Chronic pancreatitis Hyperlipidemia Hypertension PTSD Depression 2014  pancreatic duct leak with pancreatic ascites, pancreatic pseudocyst formation.  Cyst gastrostomy was performed at that time.  ERCP was performed.    Past Surgical History:  Procedure Laterality Date    . pancreatic stent placement      Social History   Socioeconomic History  . Marital status: Legally Separated    Spouse name: Not on file  . Number of children: 2  . Years of education: Not on file  . Highest education level: High school graduate  Occupational History  . Occupation: on disability  Tobacco Use  . Smoking status: Current Every Day Smoker    Packs/day: 0.25    Years: 15.00    Pack years: 3.75    Types: Cigarettes  . Smokeless tobacco: Never Used  Substance and Sexual Activity  . Alcohol use: Yes    Comment: 80 ounces a week  . Drug use: Yes    Types: Marijuana  . Sexual activity: Yes    Birth control/protection: Condom  Other Topics Concern  . Not on file  Social History Narrative  . Not on file      Family History  Problem Relation Age of Onset  . Diabetes Mother   . Sleep apnea Mother   . Emphysema Sister   . CAD Neg Hx   . Mental illness Neg Hx    Allergies  Allergen Reactions  . Hctz [Hydrochlorothiazide] Swelling    ? Current Facility-Administered Medications  Medication Dose Route Frequency Provider Last Rate Last Admin  . buPROPion (WELLBUTRIN XL) 24 hr tablet 150 mg  150 mg Oral Daily Agbata, Tochukwu, MD   150 mg at 12/06/19 0954  . enoxaparin (LOVENOX) injection 40 mg  40 mg Subcutaneous Q24H Agbata, Tochukwu, MD   40 mg at 12/05/19 2146  . fluticasone (FLONASE) 50 MCG/ACT nasal spray 1  spray  1 spray Each Nare Daily PRN Agbata, Tochukwu, MD      . folic acid injection 1 mg  1 mg Intravenous Daily Sharen Hones, MD   1 mg at 12/06/19 1307  . gabapentin (NEURONTIN) capsule 100 mg  100 mg Oral QHS PRN Agbata, Tochukwu, MD      . lactated ringers infusion   Intravenous Continuous Sharen Hones, MD 100 mL/hr at 12/06/19 1035 New Bag at 12/06/19 1035  . loratadine (CLARITIN) tablet 10 mg  10 mg Oral Daily Agbata, Tochukwu, MD   10 mg at 12/06/19 0953  . LORazepam (ATIVAN) injection 0-4 mg  0-4 mg Intravenous Q6H Agbata, Tochukwu, MD   Stopped  at 12/05/19 1709   Followed by  . [START ON 12/07/2019] LORazepam (ATIVAN) injection 0-4 mg  0-4 mg Intravenous Q12H Agbata, Tochukwu, MD      . LORazepam (ATIVAN) tablet 1-4 mg  1-4 mg Oral Q1H PRN Agbata, Tochukwu, MD       Or  . LORazepam (ATIVAN) injection 1-4 mg  1-4 mg Intravenous Q1H PRN Agbata, Tochukwu, MD      . meropenem (MERREM) 1 g in sodium chloride 0.9 % 100 mL IVPB  1 g Intravenous Q8H Duncan, Asajah R, RPH 200 mL/hr at 12/06/19 1313 1 g at 12/06/19 1313  . morphine 2 MG/ML injection 2 mg  2 mg Intravenous Q4H PRN Agbata, Tochukwu, MD   2 mg at 12/06/19 1431  . nicotine (NICODERM CQ - dosed in mg/24 hours) patch 14 mg  14 mg Transdermal Daily Agbata, Tochukwu, MD   14 mg at 12/06/19 1038  . oxyCODONE (Oxy IR/ROXICODONE) immediate release tablet 5 mg  5 mg Oral Q6H PRN Lang Snow, NP   5 mg at 12/06/19 0955  . pantoprazole (PROTONIX) injection 40 mg  40 mg Intravenous Daily Agbata, Tochukwu, MD   40 mg at 12/06/19 0955  . sertraline (ZOLOFT) tablet 100 mg  100 mg Oral Daily Agbata, Tochukwu, MD   100 mg at 12/06/19 0954  . thiamine (B-1) injection 100 mg  100 mg Intravenous Daily Sharen Hones, MD   100 mg at 12/06/19 1037  . traZODone (DESYREL) tablet 150 mg  150 mg Oral QHS PRN Agbata, Tochukwu, MD         Abtx:  Anti-infectives (From admission, onward)   Start     Dose/Rate Route Frequency Ordered Stop   12/06/19 1400  meropenem (MERREM) 500 mg in sodium chloride 0.9 % 100 mL IVPB  Status:  Discontinued     500 mg 200 mL/hr over 30 Minutes Intravenous Every 8 hours 12/06/19 1015 12/06/19 1047   12/06/19 1400  meropenem (MERREM) 1 g in sodium chloride 0.9 % 100 mL IVPB     1 g 200 mL/hr over 30 Minutes Intravenous Every 8 hours 12/06/19 1047     12/05/19 2200  metroNIDAZOLE (FLAGYL) IVPB 500 mg  Status:  Discontinued     500 mg 100 mL/hr over 60 Minutes Intravenous Every 8 hours 12/05/19 1220 12/06/19 1342   12/05/19 1800  ceFEPIme (MAXIPIME) 2 g in sodium  chloride 0.9 % 100 mL IVPB  Status:  Discontinued     2 g 200 mL/hr over 30 Minutes Intravenous Every 8 hours 12/05/19 1220 12/06/19 1015   12/05/19 0930  ceFEPIme (MAXIPIME) 2 g in sodium chloride 0.9 % 100 mL IVPB     2 g 200 mL/hr over 30 Minutes Intravenous  Once 12/05/19 0916 12/05/19 1045   12/05/19  0930  metroNIDAZOLE (FLAGYL) IVPB 500 mg     500 mg 100 mL/hr over 60 Minutes Intravenous  Once 12/05/19 0916 12/05/19 1301      REVIEW OF SYSTEMS:  Const: fever, negative chills, negative weight loss Eyes: negative diplopia or visual changes, negative eye pain ENT: negative coryza, negative sore throat Resp: Positive cough, no hemoptysis, has dyspnea Cards: negative for chest pain, palpitations, lower extremity edema GU: negative for frequency, dysuria and hematuria GI: As above Skin: negative for rash and pruritus Heme: negative for easy bruising and gum/nose bleeding MS: Generalized weakness Neurolo:negative for headaches, dizziness, vertigo, memory problems  Psych: negative for feelings of anxiety, depression  Endocrine: negative for thyroid, diabetes Allergy/Immunology-hydrochlorothiazide causes swelling of his face ?  Objective:  VITALS:  BP (!) 149/97 (BP Location: Left Arm)   Pulse 89   Temp (!) 97.4 F (36.3 C) (Oral)   Resp 19   Ht 5\' 7"  (1.702 m)   Wt 76.3 kg   SpO2 92%   BMI 26.34 kg/m  PHYSICAL EXAM:  General: Alert, cooperative, no distress, appears stated age.  Head: Normocephalic, without obvious abnormality, atraumatic. Eyes: Conjunctivae clear, anicteric sclerae. Pupils are equal ENT Nares normal. No drainage or sinus tenderness. Lips, mucosa, and tongue normal. No Thrush Neck: Supple, symmetrical, no adenopathy, thyroid: non tender no carotid bruit and no JVD. Back: No CVA tenderness. Lungs: Bilateral air entry Heart: Regular rate and rhythm, no murmur, rub or gallop. Abdomen: Soft, tenderness over the upper quadrant and upper part of the  abdomen.  Bowel sounds present extremities: atraumatic, no cyanosis. No edema. No clubbing Skin: No rashes or lesions. Or bruising Lymph: Cervical, supraclavicular normal. Neurologic: Grossly non-focal Pertinent Labs Lab Results CBC    Component Value Date/Time   WBC 17.7 (H) 12/06/2019 0421   RBC 3.95 (L) 12/06/2019 0421   HGB 12.7 (L) 12/06/2019 0421   HCT 38.1 (L) 12/06/2019 0421   PLT 186 12/06/2019 0421   MCV 96.5 12/06/2019 0421   MCH 32.2 12/06/2019 0421   MCHC 33.3 12/06/2019 0421   RDW 13.3 12/06/2019 0421   LYMPHSABS 0.7 12/05/2019 0527   MONOABS 0.1 12/05/2019 0527   EOSABS 0.1 12/05/2019 0527   BASOSABS 0.1 12/05/2019 0527    CMP Latest Ref Rng & Units 12/06/2019 12/05/2019 10/31/2019  Glucose 70 - 99 mg/dL 100(H) 173(H) 84  BUN 6 - 20 mg/dL 9 10 11   Creatinine 0.61 - 1.24 mg/dL 0.93 1.08 1.09  Sodium 135 - 145 mmol/L 137 134(L) 131(L)  Potassium 3.5 - 5.1 mmol/L 3.6 3.9 3.7  Chloride 98 - 111 mmol/L 105 101 98  CO2 22 - 32 mmol/L 24 22 20(L)  Calcium 8.9 - 10.3 mg/dL 8.7(L) 9.0 9.0  Total Protein 6.5 - 8.1 g/dL - 8.5(H) 8.6(H)  Total Bilirubin 0.3 - 1.2 mg/dL - 1.0 0.6  Alkaline Phos 38 - 126 U/L - 77 83  AST 15 - 41 U/L - 21 31  ALT 0 - 44 U/L - 16 26      Microbiology: Recent Results (from the past 240 hour(s))  Blood Culture (routine x 2)     Status: None (Preliminary result)   Collection Time: 12/05/19 10:16 AM   Specimen: BLOOD  Result Value Ref Range Status   Specimen Description   Final    BLOOD BLOOD LEFT WRIST Performed at [facility], [address], [place], [place] [address]    Special Requests   Final    BOTTLES DRAWN AEROBIC  AND ANAEROBIC Blood Culture adequate volume Performed at [facility], [place], [place] [address]    Culture  Setup Time   Final    GRAM NEGATIVE RODS AEROBIC BOTTLE ONLY CRITICAL RESULT CALLED TO, READ BACK BY AND VERIFIED WITH: Rito Ehrlich AT 0831 ON 12/06/19 SNG Performed  at [facility], [place] [address], [place], [place] [address]    Culture GRAM NEGATIVE RODS  Final   Report Status PENDING  Incomplete  Blood Culture ID Panel (Reflexed)     Status: Abnormal   Collection Time: 12/05/19 10:16 AM  Result Value Ref Range Status   Enterococcus species NOT DETECTED NOT DETECTED Final   Listeria monocytogenes NOT DETECTED NOT DETECTED Final   Staphylococcus species NOT DETECTED NOT DETECTED Final   Staphylococcus aureus (BCID) NOT DETECTED NOT DETECTED Final   Streptococcus species NOT DETECTED NOT DETECTED Final   Streptococcus agalactiae NOT DETECTED NOT DETECTED Final   Streptococcus pneumoniae NOT DETECTED NOT DETECTED Final   Streptococcus pyogenes NOT DETECTED NOT DETECTED Final   Acinetobacter baumannii NOT DETECTED NOT DETECTED Final   Enterobacteriaceae species DETECTED (A) NOT DETECTED Final    Comment: Enterobacteriaceae represent a large family of gram-negative bacteria, not a single organism. CRITICAL RESULT CALLED TO, READ BACK BY AND VERIFIED WITH: WALID NAZARI AT 0831 ON 12/06/19 SNG    Enterobacter cloacae complex NOT DETECTED NOT DETECTED Final   Escherichia coli DETECTED (A) NOT DETECTED Final    Comment: CRITICAL RESULT CALLED TO, READ BACK BY AND VERIFIED WITH: WALID NAZARI AT 0831 ON 12/06/19 SNG    Klebsiella oxytoca NOT DETECTED NOT DETECTED Final   Klebsiella pneumoniae NOT DETECTED NOT DETECTED Final   Proteus species NOT DETECTED NOT DETECTED Final   Serratia marcescens NOT DETECTED NOT DETECTED Final   Carbapenem resistance NOT DETECTED NOT DETECTED Final   Haemophilus influenzae NOT DETECTED NOT DETECTED Final   Neisseria meningitidis NOT DETECTED NOT DETECTED Final   Pseudomonas aeruginosa NOT DETECTED NOT DETECTED Final   Candida albicans NOT DETECTED NOT DETECTED Final   Candida glabrata NOT DETECTED NOT DETECTED Final   Candida krusei NOT DETECTED NOT DETECTED Final   Candida parapsilosis NOT DETECTED NOT DETECTED  Final   Candida tropicalis NOT DETECTED NOT DETECTED Final    Comment: Performed at [facility], [address] [place], [place] [address]  Blood Culture (routine x 2)     Status: None (Preliminary result)   Collection Time: 12/05/19 10:17 AM   Specimen: BLOOD  Result Value Ref Range Status   Specimen Description BLOOD LEFT ANTECUBITAL  Final   Special Requests   Final    BOTTLES DRAWN AEROBIC AND ANAEROBIC Blood Culture adequate volume   Culture   Final    NO GROWTH < 24 HOURS Performed at [facility], [address], [place], [place] [address]    Report Status PENDING  Incomplete  Respiratory Panel by RT PCR (Flu A&B, Covid) - Nasopharyngeal Swab     Status: None   Collection Time: 12/05/19 10:17 AM   Specimen: Nasopharyngeal Swab  Result Value Ref Range Status   SARS Coronavirus 2 by RT PCR NEGATIVE NEGATIVE Final    Comment: (NOTE) SARS-CoV-2 target nucleic acids are NOT DETECTED. The SARS-CoV-2 RNA is generally detectable in upper respiratoy specimens during the acute phase of infection. The lowest concentration of SARS-CoV-2 viral copies this assay can detect is 131 copies/mL. A negative result does not preclude SARS-Cov-2 infection and  should not be used as the sole basis for treatment or other patient management decisions. A negative result may occur with  improper specimen collection/handling, submission of specimen other than nasopharyngeal swab, presence of viral mutation(s) within the areas targeted by this assay, and inadequate number of viral copies (<131 copies/mL). A negative result must be combined with clinical observations, patient history, and epidemiological information. The expected result is Negative. Fact Sheet for Patients:  PinkCheek.be Fact Sheet for Healthcare Providers:  GravelBags.it This test is not yet ap proved or cleared by the [place] FDA and  has been  authorized for detection and/or diagnosis of SARS-CoV-2 by FDA under an Emergency Use Authorization (EUA). This EUA will remain  in effect (meaning this test can be used) for the duration of the COVID-19 declaration under Section 564(b)(1) of the Act, 21 U.S.C. section 360bbb-3(b)(1), unless the authorization is terminated or revoked sooner.    Influenza A by PCR NEGATIVE NEGATIVE Final   Influenza B by PCR NEGATIVE NEGATIVE Final    Comment: (NOTE) The Xpert Xpress SARS-CoV-2/FLU/RSV assay is intended as an aid in  the diagnosis of influenza from Nasopharyngeal swab specimens and  should not be used as a sole basis for treatment. Nasal washings and  aspirates are unacceptable for Xpert Xpress SARS-CoV-2/FLU/RSV  testing. Fact Sheet for Patients: PinkCheek.be Fact Sheet for Healthcare Providers: GravelBags.it This test is not yet approved or cleared by the [place] FDA and  has been authorized for detection and/or diagnosis of SARS-CoV-2 by  FDA under an Emergency Use Authorization (EUA). This EUA will remain  in effect (meaning this test can be used) for the duration of the  Covid-19 declaration under Section 564(b)(1) of the Act, 21  U.S.C. section 360bbb-3(b)(1), unless the authorization is  terminated or revoked. Performed at [facility], [address] [place], [place] [address]   Urine culture     Status: None   Collection Time: 12/05/19  3:48 PM   Specimen: In/Out Cath Urine  Result Value Ref Range Status   Specimen Description   Final    IN/OUT CATH URINE Performed at [facility], [address], [place], [place] [address]    Special Requests   Final    NONE Performed at [facility], [address] [place], [place] [address]    Culture   Final    NO GROWTH Performed at [facility], [place] [address], [place], [place] [address]    Report Status 12/06/2019 FINAL   Final    IMAGING RESULTS:   Chronic calcific pancreatitis with a dilated dorsal pancreatic duct terminating in a 2.0 cm cystic lesion or focal dilatation on image 26/2, but without formal interval growth since 04/05/2019. This could reflect chronic residua from inflammation, or intraductal papillary mucinous neoplasm. Surveillance imaging is suggested by pancreatic protocol CT or MRI in 2 years time I have personally reviewed the films . No ultrasound signs of acute cholecystitis. No biliary ductal dilation. 2. Changes of pancreatic ductal and parenchymal calcifications partially imaged. 3. Lobular hepatic contours, correlate with clinical or laboratory evidence of liver disease. ? Impression/Recommendation ? E.coli bacteremia? 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  With E. coli bacteremia the concern would be whether the cyst is infected.  E. coli is from the gastrointestinal tract.  There is no evidence of UTI or cholecystitis Patient currently on meropenem. We will be  able to de-escalate tomorrow once we have susceptibility. There is no necrosis or pseudocyst or pancreatic abscess so meropenem could be changed to ceftriaxone if it is susceptible.  History of pancreatic duct leak with pancreatic ascites, pseudocyst and cystogastrostomy and pancreatic stent in 2014 at [facility]. ? _Depression on Wellbutrin, sertraline, trazodone Hypertension on amlodipine and losartan __________________________________________________ Discussed with patient, requesting provider Note:  This document was prepared using Dragon voice recognition software and may include unintentional dictation errors.

## 2019-12-07 DIAGNOSIS — K852 Alcohol induced acute pancreatitis without necrosis or infection: Secondary | ICD-10-CM

## 2019-12-07 DIAGNOSIS — Z8719 Personal history of other diseases of the digestive system: Secondary | ICD-10-CM

## 2019-12-07 LAB — CBC WITH DIFFERENTIAL/PLATELET
Abs Immature Granulocytes: 0.06 10*3/uL (ref 0.00–0.07)
Basophils Absolute: 0 10*3/uL (ref 0.0–0.1)
Basophils Relative: 0 %
Eosinophils Absolute: 0.6 10*3/uL — ABNORMAL HIGH (ref 0.0–0.5)
Eosinophils Relative: 5 %
HCT: 35.4 % — ABNORMAL LOW (ref 39.0–52.0)
Hemoglobin: 11.9 g/dL — ABNORMAL LOW (ref 13.0–17.0)
Immature Granulocytes: 1 %
Lymphocytes Relative: 18 %
Lymphs Abs: 2 10*3/uL (ref 0.7–4.0)
MCH: 31.7 pg (ref 26.0–34.0)
MCHC: 33.6 g/dL (ref 30.0–36.0)
MCV: 94.4 fL (ref 80.0–100.0)
Monocytes Absolute: 0.6 10*3/uL (ref 0.1–1.0)
Monocytes Relative: 6 %
Neutro Abs: 7.9 10*3/uL — ABNORMAL HIGH (ref 1.7–7.7)
Neutrophils Relative %: 70 %
Platelets: 167 10*3/uL (ref 150–400)
RBC: 3.75 MIL/uL — ABNORMAL LOW (ref 4.22–5.81)
RDW: 13 % (ref 11.5–15.5)
WBC: 11.2 10*3/uL — ABNORMAL HIGH (ref 4.0–10.5)
nRBC: 0 % (ref 0.0–0.2)

## 2019-12-07 LAB — BASIC METABOLIC PANEL
Anion gap: 9 (ref 5–15)
BUN: 11 mg/dL (ref 6–20)
CO2: 24 mmol/L (ref 22–32)
Calcium: 8.6 mg/dL — ABNORMAL LOW (ref 8.9–10.3)
Chloride: 102 mmol/L (ref 98–111)
Creatinine, Ser: 0.97 mg/dL (ref 0.61–1.24)
GFR calc Af Amer: >60 mL/min (ref >60)
GFR calc non Af Amer: >60 mL/min (ref >60)
Glucose, Bld: 82 mg/dL (ref 70–99)
Potassium: 3.6 mmol/L (ref 3.5–5.1)
Sodium: 135 mmol/L (ref 135–145)

## 2019-12-07 LAB — MAGNESIUM: Magnesium: 1.9 mg/dL (ref 1.7–2.4)

## 2019-12-07 NOTE — Plan of Care (Signed)
  Problem: Pain Managment: Goal: General experience of comfort will improve Outcome: Progressing   Problem: Clinical Measurements: Goal: Diagnostic test results will improve Outcome: Progressing

## 2019-12-07 NOTE — Progress Notes (Signed)
[name], MD [facility]   [address], [place], [place] [address] Phone: [phone] Fax : [phone]   Subjective: This patient was found to have E. coli sepsis with out a source of the sepsis being found.  The patient was admitted with abdominal pain consistent with his findings of pancreatitis.  The patient had a colonoscopy which he reports to have shown 2 small adenomatous polyps with a repeat colonoscopy recommended in 5 years.  That was in 2017.  He is tolerating liquids at the present time.  His white cell count is also decreased.   Objective: Vital signs in last 24 hours: Vitals:   12/07/19 0457 12/07/19 0719 12/07/19 1115 12/07/19 1530  BP: (!) 137/91 140/88 (!) 157/95 (!) 145/92  Pulse: 85 78 88 93  Resp:  17 18 17   Temp: 98.4 F (36.9 C) 98.3 F (36.8 C) (!) 97.4 F (36.3 C) 98.6 F (37 C)  TempSrc: Oral Oral Oral Oral  SpO2: 96% 97% 99% 99%  Weight:      Height:       Weight change:   Intake/Output Summary (Last 24 hours) at 12/07/2019 1622 Last data filed at 12/07/2019 1436 Gross per 24 hour  Intake 440 ml  Output 2225 ml  Net -1785 ml     Exam: Heart:: Regular rate and rhythm, S1S2 present or without murmur or extra heart sounds Lungs: normal and clear to auscultation and percussion Abdomen: soft, nontender, normal bowel sounds   Lab Results: @LABTEST2 @ Micro Results: Recent Results (from the past 240 hour(s))  Blood Culture (routine x 2)     Status: Abnormal (Preliminary result)   Collection Time: 12/05/19 10:16 AM   Specimen: BLOOD  Result Value Ref Range Status   Specimen Description   Final    BLOOD BLOOD LEFT WRIST Performed at [facility], [address] [place], [place] [address]    Special Requests   Final    BOTTLES DRAWN AEROBIC AND ANAEROBIC Blood Culture adequate volume Performed at [facility], [address] [place], [place] [address]    Culture  Setup Time   Final    GRAM NEGATIVE  RODS AEROBIC BOTTLE ONLY CRITICAL RESULT CALLED TO, READ BACK BY AND VERIFIED WITH: Rito Ehrlich AT 0831 ON 12/06/19 SNG Performed at [facility], [address], [place], [place] [address]    Culture ESCHERICHIA COLI (A)  Final   Report Status PENDING  Incomplete  Blood Culture ID Panel (Reflexed)     Status: Abnormal   Collection Time: 12/05/19 10:16 AM  Result Value Ref Range Status   Enterococcus species NOT DETECTED NOT DETECTED Final   Listeria monocytogenes NOT DETECTED NOT DETECTED Final   Staphylococcus species NOT DETECTED NOT DETECTED Final   Staphylococcus aureus (BCID) NOT DETECTED NOT DETECTED Final   Streptococcus species NOT DETECTED NOT DETECTED Final   Streptococcus agalactiae NOT DETECTED NOT DETECTED Final   Streptococcus pneumoniae NOT DETECTED NOT DETECTED Final   Streptococcus pyogenes NOT DETECTED NOT DETECTED Final   Acinetobacter baumannii NOT DETECTED NOT DETECTED Final   Enterobacteriaceae species DETECTED (A) NOT DETECTED Final    Comment: Enterobacteriaceae represent a large family of gram-negative bacteria, not a single organism. CRITICAL RESULT CALLED TO, READ BACK BY AND VERIFIED WITH: WALID NAZARI AT 0831 ON 12/06/19 SNG    Enterobacter cloacae complex NOT DETECTED NOT DETECTED Final   Escherichia coli DETECTED (A) NOT DETECTED Final    Comment: CRITICAL RESULT CALLED TO, READ  BACK BY AND VERIFIED WITH: WALID NAZARI AT [id number] ON 12/06/19 SNG    Klebsiella oxytoca NOT DETECTED NOT DETECTED Final   Klebsiella pneumoniae NOT DETECTED NOT DETECTED Final   Proteus species NOT DETECTED NOT DETECTED Final   Serratia marcescens NOT DETECTED NOT DETECTED Final   Carbapenem resistance NOT DETECTED NOT DETECTED Final   Haemophilus influenzae NOT DETECTED NOT DETECTED Final   Neisseria meningitidis NOT DETECTED NOT DETECTED Final   Pseudomonas aeruginosa NOT DETECTED NOT DETECTED Final   Candida albicans NOT DETECTED NOT DETECTED Final   Candida glabrata  NOT DETECTED NOT DETECTED Final   Candida krusei NOT DETECTED NOT DETECTED Final   Candida parapsilosis NOT DETECTED NOT DETECTED Final   Candida tropicalis NOT DETECTED NOT DETECTED Final    Comment: Performed at [facility], [address] [place], [place] [address]  Blood Culture (routine x 2)     Status: None (Preliminary result)   Collection Time: 12/05/19 10:17 AM   Specimen: BLOOD  Result Value Ref Range Status   Specimen Description BLOOD LEFT ANTECUBITAL  Final   Special Requests   Final    BOTTLES DRAWN AEROBIC AND ANAEROBIC Blood Culture adequate volume   Culture   Final    NO GROWTH 2 DAYS Performed at [facility], [address], [place], [place] [address]    Report Status PENDING  Incomplete  Respiratory Panel by RT PCR (Flu A&B, Covid) - Nasopharyngeal Swab     Status: None   Collection Time: 12/05/19 10:17 AM   Specimen: Nasopharyngeal Swab  Result Value Ref Range Status   SARS Coronavirus 2 by RT PCR NEGATIVE NEGATIVE Final    Comment: (NOTE) SARS-CoV-2 target nucleic acids are NOT DETECTED. The SARS-CoV-2 RNA is generally detectable in upper respiratoy specimens during the acute phase of infection. The lowest concentration of SARS-CoV-2 viral copies this assay can detect is 131 copies/mL. A negative result does not preclude SARS-Cov-2 infection and should not be used as the sole basis for treatment or other patient management decisions. A negative result may occur with  improper specimen collection/handling, submission of specimen other than nasopharyngeal swab, presence of viral mutation(s) within the areas targeted by this assay, and inadequate number of viral copies (<131 copies/mL). A negative result must be combined with clinical observations, patient history, and epidemiological information. The expected result is Negative. Fact Sheet for Patients:  PinkCheek.be Fact Sheet for Healthcare Providers:   GravelBags.it This test is not yet ap proved or cleared by the [place] FDA and  has been authorized for detection and/or diagnosis of SARS-CoV-2 by FDA under an Emergency Use Authorization (EUA). This EUA will remain  in effect (meaning this test can be used) for the duration of the COVID-19 declaration under Section 564(b)(1) of the Act, 21 U.S.C. section 360bbb-3(b)(1), unless the authorization is terminated or revoked sooner.    Influenza A by PCR NEGATIVE NEGATIVE Final   Influenza B by PCR NEGATIVE NEGATIVE Final    Comment: (NOTE) The Xpert Xpress SARS-CoV-2/FLU/RSV assay is intended as an aid in  the diagnosis of influenza from Nasopharyngeal swab specimens and  should not be used as a sole basis for treatment. Nasal washings and  aspirates are unacceptable for Xpert Xpress SARS-CoV-2/FLU/RSV  testing. Fact Sheet for Patients: PinkCheek.be Fact Sheet for Healthcare Providers: GravelBags.it This test is not yet approved or cleared by the [place] FDA and  has been authorized for detection and/or diagnosis of SARS-CoV-2 by  FDA under  an Emergency Use Authorization (EUA). This EUA will remain  in effect (meaning this test can be used) for the duration of the  Covid-19 declaration under Section 564(b)(1) of the Act, 21  U.S.C. section 360bbb-3(b)(1), unless the authorization is  terminated or revoked. Performed at [facility], [address], [place], [place] [address]   Urine culture     Status: None   Collection Time: 12/05/19  3:48 PM   Specimen: In/Out Cath Urine  Result Value Ref Range Status   Specimen Description   Final    IN/OUT CATH URINE Performed at [facility], [address] [place], [place] [address]    Special Requests   Final    NONE Performed at [facility], [address] [place], [place] [address]    Culture   Final     NO GROWTH Performed at [facility], [place] [address] [place], [place] [address]    Report Status 12/06/2019 FINAL  Final   Studies/Results: US Abdomen Limited RUQ  Result Date: 12/06/2019 CLINICAL DATA:  Right upper quadrant pain and tenderness for 3 days, history of pancreatitis EXAM: ULTRASOUND ABDOMEN LIMITED RIGHT UPPER QUADRANT COMPARISON:  CT evaluation of 12/05/2019, ultrasound study 05/06/2018 FINDINGS: Gallbladder: No gallstones or wall thickening visualized. No sonographic Murphy sign noted by sonographer. Common bile duct: Diameter: 3.7 mm Liver: No focal lesion identified. Within normal limits in parenchymal echogenicity, lobular hepatic contours. Portal vein is patent on color Doppler imaging with normal direction of blood flow towards the liver. Other: Pancreatic calcifications.  Pancreas not well visualized. IMPRESSION: 1. No ultrasound signs of acute cholecystitis. No biliary ductal dilation. 2. Changes of pancreatic ductal and parenchymal calcifications partially imaged. 3. Lobular hepatic contours, correlate with clinical or laboratory evidence of liver disease. Electronically Signed   By: Zetta Bills M.D.   On: 12/06/2019 12:43   Medications: I have reviewed the patient's current medications. Scheduled Meds: . amLODipine  10 mg Oral Daily  . buPROPion  150 mg Oral Daily  . enoxaparin (LOVENOX) injection  40 mg Subcutaneous Q24H  . folic acid  1 mg Intravenous Daily  . LORazepam  0-4 mg Intravenous Q6H   Followed by  . LORazepam  0-4 mg Intravenous Q12H  . nicotine  14 mg Transdermal Daily  . pantoprazole (PROTONIX) IV  40 mg Intravenous Daily  . sertraline  100 mg Oral Daily  . thiamine injection  100 mg Intravenous Daily   Continuous Infusions: . meropenem (MERREM) IV 1 g (12/07/19 1306)   PRN Meds:.fluticasone, gabapentin, LORazepam **OR** LORazepam, morphine injection, oxyCODONE   Assessment: Principal Problem:   Sepsis ([place]) Active Problems:   Acute  on chronic pancreatitis [facility])   Essential hypertension   Depression    Plan: This patient was admitted with acute on chronic pancreatitis with septicemia.  The patient's white cell count is coming down.  The patient had 2 small adenomatous polyps in 2017 and was recommended to have a repeat colonoscopy in 2022.  When the patient has resolved his pancreatitis and is feeling better he may be considered for repeat colonoscopy in a year or earlier.  Otherwise no other source of his E. coli has been found.  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  Nothing further to do from a GI point of view in the immediate future.  I would continue conservative therapy for his pancreatitis and encourage abstinence from alcohol.  I will sign  off at this point.  Please contact me if there are any further questions.   LOS: 2 days   [name] 12/07/2019, 4:22 PM Pager [phone] 7am-5pm  Check AMION for 5pm -7am coverage and on weekends

## 2019-12-07 NOTE — Plan of Care (Signed)
  Problem: Clinical Measurements: Goal: Ability to maintain clinical measurements within normal limits will improve Outcome: Progressing   

## 2019-12-07 NOTE — Progress Notes (Signed)
PROGRESS NOTE    Noah Velasquez  PJK:[id number] DOB: 1965-10-22 DOA: 12/05/2019 PCP: Patient, No Pcp Per (Confirm with patient/family/NH records and if not entered, this HAS to be entered at [facility] point of entry. "No PCP" if truly none.)   Brief Narrative: (Start on day 1 of progress note - keep it brief and live) Patient is a 54 year old male with a history of chronic alcoholism, chronic pancreatitis who came to the hospital complaining of abdominal pain. 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  At time of admission, patient lipase was not elevated. But he had a fever, hypotension. Received IV fluid bolus. He was also placed on broad-spectrum antibiotics with cefepime and Flagyl. CT scan of abdomen/pelvis with IV contrast showed a stranding adjacent to the pancreatic tail, chronic pancreatitis with 2 cm cystic lesion, no change since August 2020.  2 mm nonobstructive renal calculus at the left upper pole.  4/21.  Blood culture pending back positive for E. coli, ID consult obtained, antibiotics switched to meropenem pending final results.   Assessment & Plan:   Principal Problem:   Sepsis ([place]) Active Problems:   Acute on chronic pancreatitis ([place])   Essential hypertension   Depression  #1.  Sepsis with E. coli septicemia.  Still pending final results with susceptibility.  Appreciated ID consult.  Continue meropenem until final results available.  Condition gradually improving.  #2. Acute on chronic alcoholic pancreatitis. Condition improving, abdominal pain is better.  No nausea vomiting.  Will start clear liquid diet, discontinue IV fluid.  Continue symptomatically treatment.  3.  Essential hypertension.Francee Piccolo restart blood pressure medicine as blood pressure has been stabilized.  #4.  Alcohol abuse. No evidence of withdrawal.  Continue thiamine folic acid.   DVT prophylaxis: (Lovenox/Heparin/SCD's/anticoagulated/None (if comfort care) Code Status: (Full/Partial - specify  details) Family Communication: (Specify name, relationship & date discussed. NO "discussed with patient") Disposition Plan:  . Patient came from:            . Anticipated d/c place: . Barriers to d/c OR conditions which need to be met to effect a safe d/c:   Consultants:   ID  Procedures: None Antimicrobials: Meropenem  Subjective: Patient seen much better today.  Abdominal pain is better.  No nausea vomiting.  He feels hungry. No additional fever. No short of breath or cough.  Objective: Vitals:   12/06/19 1517 12/06/19 2000 12/07/19 0457 12/07/19 0719  BP: (!) 149/97 (!) 152/91 (!) 137/91 140/88  Pulse: 89 96 85 78  Resp: _0 Temp: (!) 97.4 F (36.3 C) 98.4 F (36.9 C) 98.4 F (36.9 C) 98.3 F (36.8 C)  TempSrc: Oral Oral Oral Oral  SpO2: 92% 99% 96% 97%  Weight:      Height:        Intake/Output Summary (Last 24 hours) at 12/07/2019 0727 Last data filed at 12/07/2019 0524 Gross per 24 hour  Intake --  Output 1550 ml  Net -1550 ml   Filed Weights   12/05/19 0515 12/05/19 2333 12/06/19 0351  Weight: 83.5 kg 76.2 kg 76.3 kg    Examination:  General exam: Appears calm and comfortable  Respiratory system: Clear to auscultation. Respiratory effort normal. Cardiovascular system: S1 & S2 heard, RRR. No JVD, murmurs, rubs, gallops or clicks. No pedal edema. Gastrointestinal system: Abdomen is nondistended, soft and left abdominal tender. No organomegaly or masses felt. Normal bowel sounds heard. Central nervous system: Alert and oriented. No focal neurological deficits. Extremities: Symmetric 5 x 5  power. Skin: No rashes, lesions or ulcers Psychiatry: Judgement and insight appear normal. Mood & affect appropriate.     Data Reviewed: I have personally reviewed following labs and imaging studies  CBC: Recent Labs  Lab 12/05/19 0527 12/06/19 0421 12/07/19 0631  WBC 8.5 17.7* 11.2*  NEUTROABS 7.5  --  7.9*  HGB 13.6 12.7* 11.9*  HCT 40.2 38.1* 35.4*   MCV 95.0 96.5 94.4  PLT 231 186 282   Basic Metabolic Panel: Recent Labs  Lab 12/05/19 0527 12/06/19 0421 12/07/19 0631  NA 134* 137 135  K 3.9 3.6 3.6  CL 101 105 102  CO2 _0 GLUCOSE 173* 100* 82  BUN _1 CREATININE 1.08 0.93 0.97  CALCIUM 9.0 8.7* 8.6*  MG  --   --  1.9   GFR: Estimated Creatinine Clearance: 81.4 mL/min (by C-G formula based on SCr of 0.97 mg/dL). Liver Function Tests: Recent Labs  Lab 12/05/19 0527  AST 21  ALT 16  ALKPHOS 77  BILITOT 1.0  PROT 8.5*  ALBUMIN 4.0   Recent Labs  Lab 12/05/19 0527  LIPASE 163*   No results for input(s): AMMONIA in the last 168 hours. Coagulation Profile: Recent Labs  Lab 12/05/19 1016 12/06/19 0421  INR 1.3* 1.2   Cardiac Enzymes: No results for input(s): CKTOTAL, CKMB, CKMBINDEX, TROPONINI in the last 168 hours. BNP (last 3 results) No results for input(s): PROBNP in the last 8760 hours. HbA1C: No results for input(s): HGBA1C in the last 72 hours. CBG: No results for input(s): GLUCAP in the last 168 hours. Lipid Profile: No results for input(s): CHOL, HDL, LDLCALC, TRIG, CHOLHDL, LDLDIRECT in the last 72 hours. Thyroid Function Tests: No results for input(s): TSH, T4TOTAL, FREET4, T3FREE, THYROIDAB in the last 72 hours. Anemia Panel: No results for input(s): VITAMINB12, FOLATE, FERRITIN, TIBC, IRON, RETICCTPCT in the last 72 hours. Sepsis Labs: Recent Labs  Lab 12/05/19 0527 12/05/19 1017 12/06/19 0421  PROCALCITON 0.45  --  20.08  LATICACIDVEN  --  1.2  --     Recent Results (from the past 240 hour(s))  Blood Culture (routine x 2)     Status: None (Preliminary result)   Collection Time: 12/05/19 10:16 AM   Specimen: BLOOD  Result Value Ref Range Status   Specimen Description   Final    BLOOD BLOOD LEFT WRIST Performed at [facility], [address], [place], [place] [address]    Special Requests   Final    BOTTLES DRAWN AEROBIC AND ANAEROBIC Blood Culture  adequate volume Performed at [facility], [address] [place], [place] [address]    Culture  Setup Time   Final    GRAM NEGATIVE RODS AEROBIC BOTTLE ONLY CRITICAL RESULT CALLED TO, READ BACK BY AND VERIFIED WITH: Rito Ehrlich AT 4591 ON 12/06/19 SNG Performed at [facility], [place] [address] [place], [place] [address]    Culture GRAM NEGATIVE RODS  Final   Report Status PENDING  Incomplete  Blood Culture ID Panel (Reflexed)     Status: Abnormal   Collection Time: 12/05/19 10:16 AM  Result Value Ref Range Status   Enterococcus species NOT DETECTED NOT DETECTED Final   Listeria monocytogenes NOT DETECTED NOT DETECTED Final   Staphylococcus species NOT DETECTED NOT DETECTED Final   Staphylococcus aureus (BCID) NOT DETECTED NOT DETECTED Final   Streptococcus species NOT DETECTED NOT DETECTED Final   Streptococcus agalactiae NOT DETECTED NOT DETECTED Final  Streptococcus pneumoniae NOT DETECTED NOT DETECTED Final   Streptococcus pyogenes NOT DETECTED NOT DETECTED Final   Acinetobacter baumannii NOT DETECTED NOT DETECTED Final   Enterobacteriaceae species DETECTED (A) NOT DETECTED Final    Comment: Enterobacteriaceae represent a large family of gram-negative bacteria, not a single organism. CRITICAL RESULT CALLED TO, READ BACK BY AND VERIFIED WITH: WALID NAZARI AT 0831 ON 12/06/19 SNG    Enterobacter cloacae complex NOT DETECTED NOT DETECTED Final   Escherichia coli DETECTED (A) NOT DETECTED Final    Comment: CRITICAL RESULT CALLED TO, READ BACK BY AND VERIFIED WITH: WALID NAZARI AT 0831 ON 12/06/19 SNG    Klebsiella oxytoca NOT DETECTED NOT DETECTED Final   Klebsiella pneumoniae NOT DETECTED NOT DETECTED Final   Proteus species NOT DETECTED NOT DETECTED Final   Serratia marcescens NOT DETECTED NOT DETECTED Final   Carbapenem resistance NOT DETECTED NOT DETECTED Final   Haemophilus influenzae NOT DETECTED NOT DETECTED Final   Neisseria meningitidis NOT DETECTED NOT  DETECTED Final   Pseudomonas aeruginosa NOT DETECTED NOT DETECTED Final   Candida albicans NOT DETECTED NOT DETECTED Final   Candida glabrata NOT DETECTED NOT DETECTED Final   Candida krusei NOT DETECTED NOT DETECTED Final   Candida parapsilosis NOT DETECTED NOT DETECTED Final   Candida tropicalis NOT DETECTED NOT DETECTED Final    Comment: Performed at [facility], [address] [place], [place] [address]  Blood Culture (routine x 2)     Status: None (Preliminary result)   Collection Time: 12/05/19 10:17 AM   Specimen: BLOOD  Result Value Ref Range Status   Specimen Description BLOOD LEFT ANTECUBITAL  Final   Special Requests   Final    BOTTLES DRAWN AEROBIC AND ANAEROBIC Blood Culture adequate volume   Culture   Final    NO GROWTH < 24 HOURS Performed at [facility], [address] [place], [place] [address]    Report Status PENDING  Incomplete  Respiratory Panel by RT PCR (Flu A&B, Covid) - Nasopharyngeal Swab     Status: None   Collection Time: 12/05/19 10:17 AM   Specimen: Nasopharyngeal Swab  Result Value Ref Range Status   SARS Coronavirus 2 by RT PCR NEGATIVE NEGATIVE Final    Comment: (NOTE) SARS-CoV-2 target nucleic acids are NOT DETECTED. The SARS-CoV-2 RNA is generally detectable in upper respiratoy specimens during the acute phase of infection. The lowest concentration of SARS-CoV-2 viral copies this assay can detect is 131 copies/mL. A negative result does not preclude SARS-Cov-2 infection and should not be used as the sole basis for treatment or other patient management decisions. A negative result may occur with  improper specimen collection/handling, submission of specimen other than nasopharyngeal swab, presence of viral mutation(s) within the areas targeted by this assay, and inadequate number of viral copies (<131 copies/mL). A negative result must be combined with clinical observations, patient history, and epidemiological  information. The expected result is Negative. Fact Sheet for Patients:  PinkCheek.be Fact Sheet for Healthcare Providers:  GravelBags.it This test is not yet ap proved or cleared by the [place] FDA and  has been authorized for detection and/or diagnosis of SARS-CoV-2 by FDA under an Emergency Use Authorization (EUA). This EUA will remain  in effect (meaning this test can be used) for the duration of the COVID-19 declaration under Section 564(b)(1) of the Act, 21 U.S.C. section 360bbb-3(b)(1), unless the authorization is terminated or revoked sooner.    Influenza A by PCR NEGATIVE NEGATIVE Final  Influenza B by PCR NEGATIVE NEGATIVE Final    Comment: (NOTE) The Xpert Xpress SARS-CoV-2/FLU/RSV assay is intended as an aid in  the diagnosis of influenza from Nasopharyngeal swab specimens and  should not be used as a sole basis for treatment. Nasal washings and  aspirates are unacceptable for Xpert Xpress SARS-CoV-2/FLU/RSV  testing. Fact Sheet for Patients: PinkCheek.be Fact Sheet for Healthcare Providers: GravelBags.it This test is not yet approved or cleared by the [place] FDA and  has been authorized for detection and/or diagnosis of SARS-CoV-2 by  FDA under an Emergency Use Authorization (EUA). This EUA will remain  in effect (meaning this test can be used) for the duration of the  Covid-19 declaration under Section 564(b)(1) of the Act, 21  U.S.C. section 360bbb-3(b)(1), unless the authorization is  terminated or revoked. Performed at [facility], [address] [place], [place] [address]   Urine culture     Status: None   Collection Time: 12/05/19  3:48 PM   Specimen: In/Out Cath Urine  Result Value Ref Range Status   Specimen Description   Final    IN/OUT CATH URINE Performed at [facility], [address], [place],  [place] [address]    Special Requests   Final    NONE Performed at [facility], [address], [place], [place] [address]    Culture   Final    NO GROWTH Performed at [facility], [place] [address] [place], [place] [address]    Report Status 12/06/2019 FINAL  Final         Radiology Studies: CT ABDOMEN PELVIS W CONTRAST  Result Date: 12/05/2019 CLINICAL DATA:  Left lower abdominal pain with nausea and vomiting. EXAM: CT ABDOMEN AND PELVIS WITH CONTRAST TECHNIQUE: Multidetector CT imaging of the abdomen and pelvis was performed using the standard protocol following bolus administration of intravenous contrast. CONTRAST:  130m OMNIPAQUE IOHEXOL 300 MG/ML  SOLN COMPARISON:  04/05/2019 FINDINGS: Lower chest: The peripheral interstitial accentuation in the lung bases is mildly increased compared to prior. Some of this is more confluent dependently and probably represents component of dependent atelectasis given the crowding of vessels. Mild cardiomegaly. Descending thoracic aortic atherosclerosis. Small hiatal hernia. Adjacent lymph nodes near the gastroesophageal junction are similar to prior and measure up to 0.8 cm in short axis, significance uncertain. Right coronary artery atherosclerotic calcification. Hepatobiliary: Punctate calcifications along the dome the liver favoring old granulomatous disease. Gallbladder unremarkable. Pancreas: Considerable pancreatic calcifications favoring chronic calcific pancreatitis. Dilated dorsal pancreatic duct terminating in a 2.0 by 1.9 by 2.0 cm cystic lesion or focal dilatation on image 26/2, by my measurements this was 1.9 by 1.1 by 1.3 cm on 04/29/2016 and more recently 2.0 by 1.7 by 2.0 cm on 04/05/2019. Accordingly there has been no formal ?interval growth? since 04/05/2019. This lesion could reflect chronic residua from inflammation, or intraductal papillary mucinous neoplasm. There is subtle stranding adjacent to the tail of the pancreas  suspicious for acute focal pancreatitis. However, this is less striking than on the 04/05/2019 exam. Spleen: Unremarkable Adrenals/Urinary Tract: 1.1 by 1.2 by 1.7 cm nodule of the left adrenal gland with portal venous phase density 75 Hounsfield units and delayed phase density 48 Hounsfield units yielding a relative washout of less than 40% which is indeterminate. This did not have a significant degree of dropout of signal on out of phase images the prior MRI from 05/06/2018 and also had a density of 19 Hounsfield units on precontrast  images of 04/02/2017 accordingly remaining nonspecific. However, the lack of growth of this lesion compared to the earliest available comparison of 04/29/2016 strongly favors a benign etiology. Scattered scarring in both kidneys, right greater than left, similar pattern, distribution, appearance compared to 04/05/2019. 2 mm left kidney upper pole nonobstructive renal calculuson image 36/6. Stomach/Bowel: Small hiatal hernia. Normal appendix. Vascular/Lymphatic: Aortoiliac atherosclerotic vascular disease. Scattered small retroperitoneal lymph nodes are present. Right inguinal node 1.3 cm in short axis on image 81/2, formerly the same. Right external iliac node along the right inguinal ring, 0.8 cm in short axis on image 73/2. Reproductive: Unremarkable Other: Subtle presacral stranding is unchanged from the prior exam. Musculoskeletal: Very small umbilical hernia containing adipose tissue and a margin of a small bowel loop without complicating feature. Small indirect right inguinal hernia contains adipose tissue. Markedly severe and asymmetric degenerative arthropathy of the right hip. Lumbar spondylosis, degenerative disc disease, and congenitally short pedicles contributing to suspected impingement at multiple lumbar levels. IMPRESSION: 1. Subtle stranding adjacent to the tail of the pancreas suspicious for acute focal pancreatitis. However, this is less striking than on the  04/05/2019 exam. 2. Chronic calcific pancreatitis with a dilated dorsal pancreatic duct terminating in a 2.0 cm cystic lesion or focal dilatation on image 26/2, but without formal interval growth since 04/05/2019. This could reflect chronic residua from inflammation, or intraductal papillary mucinous neoplasm. Surveillance imaging is suggested by pancreatic protocol CT or MRI in 2 years time. This recommendation follows ACR consensus guidelines: Management of Incidental Pancreatic Cysts: A White Paper of the ACR Incidental Findings Committee. J Am Coll Radiol 4235;36:144-315. 3. Other imaging findings of potential clinical significance: Coronary atherosclerosis. Mild cardiomegaly. Small hiatal hernia. Small indirect right inguinal hernia containing adipose tissue. Markedly severe and asymmetric degenerative arthropathy of the right hip. Lumbar spondylosis, degenerative disc disease, and congenitally short pedicles contributing to suspected impingement at multiple lumbar levels. 4. 2 mm left kidney upper pole nonobstructive renal calculus. 5. Aortic atherosclerosis. Aortic Atherosclerosis (ICD10-I70.0). Electronically Signed   By: Van Clines M.D.   On: 12/05/2019 11:07   US Abdomen Limited RUQ  Result Date: 12/06/2019 CLINICAL DATA:  Right upper quadrant pain and tenderness for 3 days, history of pancreatitis EXAM: ULTRASOUND ABDOMEN LIMITED RIGHT UPPER QUADRANT COMPARISON:  CT evaluation of 12/05/2019, ultrasound study 05/06/2018 FINDINGS: Gallbladder: No gallstones or wall thickening visualized. No sonographic Murphy sign noted by sonographer. Common bile duct: Diameter: 3.7 mm Liver: No focal lesion identified. Within normal limits in parenchymal echogenicity, lobular hepatic contours. Portal vein is patent on color Doppler imaging with normal direction of blood flow towards the liver. Other: Pancreatic calcifications.  Pancreas not well visualized. IMPRESSION: 1. No ultrasound signs of acute  cholecystitis. No biliary ductal dilation. 2. Changes of pancreatic ductal and parenchymal calcifications partially imaged. 3. Lobular hepatic contours, correlate with clinical or laboratory evidence of liver disease. Electronically Signed   By: Zetta Bills M.D.   On: 12/06/2019 12:43        Scheduled Meds: . amLODipine  10 mg Oral Daily  . buPROPion  150 mg Oral Daily  . enoxaparin (LOVENOX) injection  40 mg Subcutaneous Q24H  . folic acid  1 mg Intravenous Daily  . loratadine  10 mg Oral Daily  . LORazepam  0-4 mg Intravenous Q6H   Followed by  . LORazepam  0-4 mg Intravenous Q12H  . nicotine  14 mg Transdermal Daily  . pantoprazole (PROTONIX) IV  40 mg Intravenous Daily  . sertraline  100 mg Oral Daily  . thiamine injection  100 mg Intravenous Daily   Continuous Infusions: . lactated ringers 100 mL/hr at 12/06/19 2244  . meropenem (MERREM) IV 1 g (12/07/19 0502)     LOS: 2 days    Time spent: 28 minutes    Sharen Hones, MD [facility]   To contact the attending provider between 7A-7P or the covering provider during after hours 7P-7A, please log into the web site [email or web address] and access using universal [place] password for that web site. If you do not have the password, please call the hospital operator.  12/07/2019, 7:27 AM

## 2019-12-07 NOTE — Progress Notes (Signed)
Date of Admission:  12/05/2019    Feeling better Says abdominal  pain better  Medications:  . amLODipine  10 mg Oral Daily  . buPROPion  150 mg Oral Daily  . enoxaparin (LOVENOX) injection  40 mg Subcutaneous Q24H  . folic acid  1 mg Intravenous Daily  . loratadine  10 mg Oral Daily  . LORazepam  0-4 mg Intravenous Q6H   Followed by  . LORazepam  0-4 mg Intravenous Q12H  . nicotine  14 mg Transdermal Daily  . pantoprazole (PROTONIX) IV  40 mg Intravenous Daily  . sertraline  100 mg Oral Daily  . thiamine injection  100 mg Intravenous Daily    Objective: Vital signs in last 24 hours: Temp:  [97.4 F (36.3 C)-98.4 F (36.9 C)] 98.3 F (36.8 C) (04/22 0719) Pulse Rate:  [78-96] 78 (04/22 0719) Resp:  [17-20] 17 (04/22 0719) BP: (137-152)/(88-97) 140/88 (04/22 0719) SpO2:  [92 %-100 %] 97 % (04/22 0719)  PHYSICAL EXAM:  General: Alert, cooperative, no distress, appears stated age.  Lungs: b/la ir entry Heart:s1s2 Abdomen: Soft, some tenderness  . Bowel sounds normal. No masses Extremities: atraumatic, no cyanosis. No edema. No clubbing Skin: No rashes or lesions. Or bruising Lymph: Cervical, supraclavicular normal. Neurologic: Grossly non-focal  Lab Results Recent Labs    12/06/19 0421 12/07/19 0631  WBC 17.7* 11.2*  HGB 12.7* 11.9*  HCT 38.1* 35.4*  NA 137 135  K 3.6 3.6  CL 105 102  CO2 24 24  BUN 9 11  CREATININE 0.93 0.97   Liver Panel Recent Labs    12/05/19 0527  PROT 8.5*  ALBUMIN 4.0  AST 21  ALT 16  ALKPHOS 77  BILITOT 1.0   Sedimentation Rate No results for input(s): ESRSEDRATE in the last 72 hours. C-Reactive Protein No results for input(s): CRP in the last 72 hours.  Microbiology:  Studies/Results: US Abdomen Limited RUQ  Result Date: 12/06/2019 CLINICAL DATA:  Right upper quadrant pain and tenderness for 3 days, history of pancreatitis EXAM: ULTRASOUND ABDOMEN LIMITED RIGHT UPPER QUADRANT COMPARISON:  CT evaluation of  12/05/2019, ultrasound study 05/06/2018 FINDINGS: Gallbladder: No gallstones or wall thickening visualized. No sonographic Murphy sign noted by sonographer. Common bile duct: Diameter: 3.7 mm Liver: No focal lesion identified. Within normal limits in parenchymal echogenicity, lobular hepatic contours. Portal vein is patent on color Doppler imaging with normal direction of blood flow towards the liver. Other: Pancreatic calcifications.  Pancreas not well visualized. IMPRESSION: 1. No ultrasound signs of acute cholecystitis. No biliary ductal dilation. 2. Changes of pancreatic ductal and parenchymal calcifications partially imaged. 3. Lobular hepatic contours, correlate with clinical or laboratory evidence of liver disease. Electronically Signed   By: Zetta Bills M.D.   On: 12/06/2019 12:43   IMAGING RESULTS:   Chronic calcific pancreatitis with a dilated dorsal pancreatic duct terminating in a 2.0 cm cystic lesion or focal dilatation on image 26/2, but without formal interval growth since 04/05/2019. This could reflect chronic residua from inflammation, or intraductal papillary mucinous neoplasm. Surveillance imaging is suggested by pancreatic protocol CT or MRI in 2 years time I have personally reviewed the films . No ultrasound signs of acute cholecystitis. No biliary ductal dilation. 2. Changes of pancreatic ductal and parenchymal calcifications partially imaged. 3. Lobular hepatic contours, correlate with clinical or laboratory evidence of liver disease. ? Impression/Recommendation ? E.coli bacteremia? Acute on chronic pancreatitis due to alcohol with calcification in pancreas and also cystic lesion in the tail of  the pancreas.  With E. coli bacteremia the concern would be whether the cyst is infected.  E. coli is from the gastrointestinal tract.  There is no evidence of UTI or cholecystitis Patient currently on meropenem. We will be able to de-escalate  once we have  susceptibility.   History of pancreatic duct leak with pancreatic ascites, pseudocyst and cystogastrostomy and pancreatic stent in 2014 at [facility]. ? _Depression on Wellbutrin, sertraline, trazodone Hypertension on amlodipine and losartan __________________________________________________ Discussed with patient

## 2019-12-08 DIAGNOSIS — F3110 Bipolar disorder, current episode manic without psychotic features, unspecified: Secondary | ICD-10-CM | POA: Diagnosis not present

## 2019-12-08 LAB — CULTURE, BLOOD (ROUTINE X 2): Special Requests: ADEQUATE

## 2019-12-08 LAB — MAGNESIUM: Magnesium: 1.9 mg/dL (ref 1.7–2.4)

## 2019-12-08 LAB — CBC
HCT: 38 % — ABNORMAL LOW (ref 39.0–52.0)
Hemoglobin: 13.5 g/dL (ref 13.0–17.0)
MCH: 32.4 pg (ref 26.0–34.0)
MCHC: 35.5 g/dL (ref 30.0–36.0)
MCV: 91.1 fL (ref 80.0–100.0)
Platelets: 191 10*3/uL (ref 150–400)
RBC: 4.17 MIL/uL — ABNORMAL LOW (ref 4.22–5.81)
RDW: 12.5 % (ref 11.5–15.5)
WBC: 9.3 10*3/uL (ref 4.0–10.5)
nRBC: 0 % (ref 0.0–0.2)

## 2019-12-08 LAB — BASIC METABOLIC PANEL
Anion gap: 6 (ref 5–15)
BUN: 9 mg/dL (ref 6–20)
CO2: 27 mmol/L (ref 22–32)
Calcium: 8.9 mg/dL (ref 8.9–10.3)
Chloride: 100 mmol/L (ref 98–111)
Creatinine, Ser: 0.75 mg/dL (ref 0.61–1.24)
GFR calc Af Amer: >60 mL/min (ref >60)
GFR calc non Af Amer: >60 mL/min (ref >60)
Glucose, Bld: 110 mg/dL — ABNORMAL HIGH (ref 70–99)
Potassium: 3.5 mmol/L (ref 3.5–5.1)
Sodium: 133 mmol/L — ABNORMAL LOW (ref 135–145)

## 2019-12-08 MED ORDER — SODIUM CHLORIDE 0.9 % IV SOLN
1.0000 g | INTRAVENOUS | Status: DC
  Administered 2019-12-09: 1000 mg via INTRAVENOUS
  Filled 2019-12-08 (×2): qty 1

## 2019-12-08 NOTE — Treatment Plan (Signed)
Diagnosis: ESBL e.coli bacteremia Baseline Creatinine < 1    Allergies  Allergen Reactions  . Hctz [Hydrochlorothiazide] Swelling    OPAT Orders Discharge antibiotics: Ertapenem 1 gram IV every 24 hours until 12/15/19  Labs weekly on Tuesday  while on IV antibiotics: _X_ CBC with differential  _X_ CMP  No follow up with ID

## 2019-12-08 NOTE — Progress Notes (Signed)
ID ESBL e.col bacteremia Cystic pancreatitis No necrosis No pseudocyst Pt says pain better He is currently on meropenem Will get Iv ertapenem until 4/30 With h/o cocaine use will not send him with PICC line - He will come tot the day surgery to get IV ertapenem until 12/15/19 Discussed with care team Discussed with patient and he has made arrangements to come to day surgery- his room mate Grayland Ormond will drive him every day

## 2019-12-08 NOTE — Progress Notes (Addendum)
PROGRESS NOTE    Noah Velasquez  LNL:[id number] DOB: 30-Apr-1966 DOA: 12/05/2019 PCP: Patient, No Pcp Per (Confirm with patient/family/NH records and if not entered, this HAS to be entered at [facility] point of entry. "No PCP" if truly none.)   Brief Narrative: (Start on day 1 of progress note - keep it brief and live)  Patient is a 54 year old male with a history of chronic alcoholism, chronic pancreatitis who came to the hospital complaining of abdominal pain. 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 At time of admission, patient lipase was not elevated. But he had a fever, hypotension. Received IV fluid bolus. He was also placed on broad-spectrum antibioticswith cefepime and Flagyl. CT scan of abdomen/pelvis with IV contrast showed a stranding adjacent to the pancreatic tail, chronic pancreatitis with 2 cm cystic lesion, no change since August 2020. 2 mm nonobstructive renal calculus at the left upper pole.  4/21.  Blood culture pending back positive for E. coli, ID consult obtained, antibiotics switched to meropenem pending final results. 4/22.  Blood culture came back with E. coli ESBL in 1 bottle.  Continue meropenem.  Advance diet.    Assessment & Plan:   Principal Problem:   Sepsis ([place]) Active Problems:   Acute on chronic pancreatitis ([place])   Essential hypertension   Depression  #1.  Sepsis with E. coli septicemia. Blood culture came back with a 1 bottle positive for E. coli ESBL.  I will continue meropenem today.  Patient clinically has improved.  Leukocytosis has resolved.  We will recheck a procalcitonin level tomorrow to confirm.  Discussed with ID, patient will need 7 more days IV antibiotics from today.  Potentially with ertapenem via a peripheral line in the IV infusion center.  #2.  Acute on chronic alcoholism pancreatitis. Condition is improving.  Advance to soft diet.  He will need a few days of oral pain medicine at time of discharge.  3.  Essential  hypertension. Continue home medicines.  4.  Alcohol abuse. No evidence of withdrawal.  Continue thiamine folic acid.   DVT prophylaxis: Lovenox Code Status: Full Family Communication: Plan discussed with the patient, all questions answered. Disposition Plan:  . Patient came from:            . Anticipated d/c place: . Barriers to d/c OR conditions which need to be met to effect a safe d/c:   Consultants:   GI and ID.  Procedures: (None Antimicrobials: Meropenem.  Subjective: Patient still complaining of pain in the left upper quadrant, but is tolerating liquid diet well.  No nausea or vomiting. No longer has any diarrhea since admission. No short of breath or cough.  Objective: Vitals:   12/07/19 1917 12/07/19 2300 12/08/19 0618 12/08/19 0823  BP: 134/85 134/85 (!) 137/98 (!) 134/98  Pulse: 85 85 78 81  Resp: 20   17  Temp: 98.9 F (37.2 C)  98.4 F (36.9 C) 98.2 F (36.8 C)  TempSrc: Oral  Oral Oral  SpO2: 99%  94% 100%  Weight:      Height:        Intake/Output Summary (Last 24 hours) at 12/08/2019 0900 Last data filed at 12/08/2019 0843 Gross per 24 hour  Intake 1160 ml  Output 1975 ml  Net -815 ml   Filed Weights   12/05/19 0515 12/05/19 2333 12/06/19 0351  Weight: 83.5 kg 76.2 kg 76.3 kg    Examination:  General exam: Appears calm and comfortable  Respiratory system: Clear to auscultation. Respiratory effort normal.  Cardiovascular system: S1 & S2 heard, RRR. No JVD, murmurs, rubs, gallops or clicks. No pedal edema. Gastrointestinal system: Abdomen is nondistended, soft and Left abdomen tender. No organomegaly or masses felt. Normal bowel sounds heard. Central nervous system: Alert and oriented. No focal neurological deficits. Extremities: Symmetric 5 x 5 power. Skin: No rashes, lesions or ulcers Psychiatry: Judgement and insight appear normal. Mood & affect appropriate.     Data Reviewed: I have personally reviewed following labs and imaging  studies  CBC: Recent Labs  Lab 12/05/19 0527 12/06/19 0421 12/07/19 0631 12/08/19 0433  WBC 8.5 17.7* 11.2* 9.3  NEUTROABS 7.5  --  7.9*  --   HGB 13.6 12.7* 11.9* 13.5  HCT 40.2 38.1* 35.4* 38.0*  MCV 95.0 96.5 94.4 91.1  PLT 231 186 167 633   Basic Metabolic Panel: Recent Labs  Lab 12/05/19 0527 12/06/19 0421 12/07/19 0631 12/08/19 0433  NA 134* 137 135 133*  K 3.9 3.6 3.6 3.5  CL 101 105 102 100  CO2 '22 24 24 27  ' GLUCOSE 173* 100* 82 110*  BUN '10 9 11 9  ' CREATININE 1.08 0.93 0.97 0.75  CALCIUM 9.0 8.7* 8.6* 8.9  MG  --   --  1.9 1.9   GFR: Estimated Creatinine Clearance: 98.7 mL/min (by C-G formula based on SCr of 0.75 mg/dL). Liver Function Tests: Recent Labs  Lab 12/05/19 0527  AST 21  ALT 16  ALKPHOS 77  BILITOT 1.0  PROT 8.5*  ALBUMIN 4.0   Recent Labs  Lab 12/05/19 0527  LIPASE 163*   No results for input(s): AMMONIA in the last 168 hours. Coagulation Profile: Recent Labs  Lab 12/05/19 1016 12/06/19 0421  INR 1.3* 1.2   Cardiac Enzymes: No results for input(s): CKTOTAL, CKMB, CKMBINDEX, TROPONINI in the last 168 hours. BNP (last 3 results) No results for input(s): PROBNP in the last 8760 hours. HbA1C: No results for input(s): HGBA1C in the last 72 hours. CBG: No results for input(s): GLUCAP in the last 168 hours. Lipid Profile: No results for input(s): CHOL, HDL, LDLCALC, TRIG, CHOLHDL, LDLDIRECT in the last 72 hours. Thyroid Function Tests: No results for input(s): TSH, T4TOTAL, FREET4, T3FREE, THYROIDAB in the last 72 hours. Anemia Panel: No results for input(s): VITAMINB12, FOLATE, FERRITIN, TIBC, IRON, RETICCTPCT in the last 72 hours. Sepsis Labs: Recent Labs  Lab 12/05/19 0527 12/05/19 1017 12/06/19 0421  PROCALCITON 0.45  --  20.08  LATICACIDVEN  --  1.2  --     Recent Results (from the past 240 hour(s))  Blood Culture (routine x 2)     Status: Abnormal   Collection Time: 12/05/19 10:16 AM   Specimen: BLOOD  Result  Value Ref Range Status   Specimen Description   Final    BLOOD BLOOD LEFT WRIST Performed at [facility], [address] [place], [place] [address]    Special Requests   Final    BOTTLES DRAWN AEROBIC AND ANAEROBIC Blood Culture adequate volume Performed at [facility], [address] [place], [place] [address]    Culture  Setup Time   Final    GRAM NEGATIVE RODS AEROBIC BOTTLE ONLY CRITICAL RESULT CALLED TO, READ BACK BY AND VERIFIED WITH: Rito Ehrlich AT 9373 ON 12/06/19 SNG Performed at [facility], [place] [address], [place], [place] [address]    Culture (A)  Final    ESCHERICHIA COLI Confirmed Extended Spectrum Beta-Lactamase Producer (ESBL).  In bloodstream infections from ESBL organisms, carbapenems are  preferred over piperacillin/tazobactam. They are shown to have a lower risk of mortality.    Report Status 12/08/2019 FINAL  Final   Organism ID, Bacteria ESCHERICHIA COLI  Final      Susceptibility   Escherichia coli - MIC*    AMPICILLIN >=32 RESISTANT Resistant     CEFAZOLIN >=64 RESISTANT Resistant     CEFEPIME >=32 RESISTANT Resistant     CEFTAZIDIME >=64 RESISTANT Resistant     CEFTRIAXONE >=64 RESISTANT Resistant     CIPROFLOXACIN >=4 RESISTANT Resistant     GENTAMICIN <=1 SENSITIVE Sensitive     IMIPENEM <=0.25 SENSITIVE Sensitive     TRIMETH/SULFA <=20 SENSITIVE Sensitive     AMPICILLIN/SULBACTAM >=32 RESISTANT Resistant     PIP/TAZO <=4 SENSITIVE Sensitive     * ESCHERICHIA COLI  Blood Culture ID Panel (Reflexed)     Status: Abnormal   Collection Time: 12/05/19 10:16 AM  Result Value Ref Range Status   Enterococcus species NOT DETECTED NOT DETECTED Final   Listeria monocytogenes NOT DETECTED NOT DETECTED Final   Staphylococcus species NOT DETECTED NOT DETECTED Final   Staphylococcus aureus (BCID) NOT DETECTED NOT DETECTED Final   Streptococcus species NOT DETECTED NOT DETECTED Final   Streptococcus agalactiae NOT DETECTED NOT  DETECTED Final   Streptococcus pneumoniae NOT DETECTED NOT DETECTED Final   Streptococcus pyogenes NOT DETECTED NOT DETECTED Final   Acinetobacter baumannii NOT DETECTED NOT DETECTED Final   Enterobacteriaceae species DETECTED (A) NOT DETECTED Final    Comment: Enterobacteriaceae represent a large family of gram-negative bacteria, not a single organism. CRITICAL RESULT CALLED TO, READ BACK BY AND VERIFIED WITH: WALID NAZARI AT 0831 ON 12/06/19 SNG    Enterobacter cloacae complex NOT DETECTED NOT DETECTED Final   Escherichia coli DETECTED (A) NOT DETECTED Final    Comment: CRITICAL RESULT CALLED TO, READ BACK BY AND VERIFIED WITH: WALID NAZARI AT 0831 ON 12/06/19 SNG    Klebsiella oxytoca NOT DETECTED NOT DETECTED Final   Klebsiella pneumoniae NOT DETECTED NOT DETECTED Final   Proteus species NOT DETECTED NOT DETECTED Final   Serratia marcescens NOT DETECTED NOT DETECTED Final   Carbapenem resistance NOT DETECTED NOT DETECTED Final   Haemophilus influenzae NOT DETECTED NOT DETECTED Final   Neisseria meningitidis NOT DETECTED NOT DETECTED Final   Pseudomonas aeruginosa NOT DETECTED NOT DETECTED Final   Candida albicans NOT DETECTED NOT DETECTED Final   Candida glabrata NOT DETECTED NOT DETECTED Final   Candida krusei NOT DETECTED NOT DETECTED Final   Candida parapsilosis NOT DETECTED NOT DETECTED Final   Candida tropicalis NOT DETECTED NOT DETECTED Final    Comment: Performed at [facility], [address] [place], [place] [address]  Blood Culture (routine x 2)     Status: None (Preliminary result)   Collection Time: 12/05/19 10:17 AM   Specimen: BLOOD  Result Value Ref Range Status   Specimen Description BLOOD LEFT ANTECUBITAL  Final   Special Requests   Final    BOTTLES DRAWN AEROBIC AND ANAEROBIC Blood Culture adequate volume   Culture   Final    NO GROWTH 3 DAYS Performed at [facility], [address] [place], [place] [address]    Report Status PENDING   Incomplete  Respiratory Panel by RT PCR (Flu A&B, Covid) - Nasopharyngeal Swab     Status: None   Collection Time: 12/05/19 10:17 AM   Specimen: Nasopharyngeal Swab  Result Value Ref Range Status   SARS Coronavirus 2 by RT PCR NEGATIVE  NEGATIVE Final    Comment: (NOTE) SARS-CoV-2 target nucleic acids are NOT DETECTED. The SARS-CoV-2 RNA is generally detectable in upper respiratoy specimens during the acute phase of infection. The lowest concentration of SARS-CoV-2 viral copies this assay can detect is 131 copies/mL. A negative result does not preclude SARS-Cov-2 infection and should not be used as the sole basis for treatment or other patient management decisions. A negative result may occur with  improper specimen collection/handling, submission of specimen other than nasopharyngeal swab, presence of viral mutation(s) within the areas targeted by this assay, and inadequate number of viral copies (<131 copies/mL). A negative result must be combined with clinical observations, patient history, and epidemiological information. The expected result is Negative. Fact Sheet for Patients:  PinkCheek.be Fact Sheet for Healthcare Providers:  GravelBags.it This test is not yet ap proved or cleared by the [place] FDA and  has been authorized for detection and/or diagnosis of SARS-CoV-2 by FDA under an Emergency Use Authorization (EUA). This EUA will remain  in effect (meaning this test can be used) for the duration of the COVID-19 declaration under Section 564(b)(1) of the Act, 21 U.S.C. section 360bbb-3(b)(1), unless the authorization is terminated or revoked sooner.    Influenza A by PCR NEGATIVE NEGATIVE Final   Influenza B by PCR NEGATIVE NEGATIVE Final    Comment: (NOTE) The Xpert Xpress SARS-CoV-2/FLU/RSV assay is intended as an aid in  the diagnosis of influenza from Nasopharyngeal swab specimens and  should not be used  as a sole basis for treatment. Nasal washings and  aspirates are unacceptable for Xpert Xpress SARS-CoV-2/FLU/RSV  testing. Fact Sheet for Patients: PinkCheek.be Fact Sheet for Healthcare Providers: GravelBags.it This test is not yet approved or cleared by the [place] FDA and  has been authorized for detection and/or diagnosis of SARS-CoV-2 by  FDA under an Emergency Use Authorization (EUA). This EUA will remain  in effect (meaning this test can be used) for the duration of the  Covid-19 declaration under Section 564(b)(1) of the Act, 21  U.S.C. section 360bbb-3(b)(1), unless the authorization is  terminated or revoked. Performed at [facility], [address] [place], [place] [address]   Urine culture     Status: None   Collection Time: 12/05/19  3:48 PM   Specimen: In/Out Cath Urine  Result Value Ref Range Status   Specimen Description   Final    IN/OUT CATH URINE Performed at [facility], [address], [place], [place] [address]    Special Requests   Final    NONE Performed at [facility], [address], [place], [place] [address]    Culture   Final    NO GROWTH Performed at [facility], [place] [address], [place], [place] [address]    Report Status 12/06/2019 FINAL  Final         Radiology Studies: US Abdomen Limited RUQ  Result Date: 12/06/2019 CLINICAL DATA:  Right upper quadrant pain and tenderness for 3 days, history of pancreatitis EXAM: ULTRASOUND ABDOMEN LIMITED RIGHT UPPER QUADRANT COMPARISON:  CT evaluation of 12/05/2019, ultrasound study 05/06/2018 FINDINGS: Gallbladder: No gallstones or wall thickening visualized. No sonographic Murphy sign noted by sonographer. Common bile duct: Diameter: 3.7 mm Liver: No focal lesion identified. Within normal limits in parenchymal echogenicity, lobular hepatic contours. Portal vein is patent on color Doppler imaging with  normal direction of blood flow towards the liver. Other: Pancreatic calcifications.  Pancreas not well visualized. IMPRESSION: 1. No ultrasound  signs of acute cholecystitis. No biliary ductal dilation. 2. Changes of pancreatic ductal and parenchymal calcifications partially imaged. 3. Lobular hepatic contours, correlate with clinical or laboratory evidence of liver disease. Electronically Signed   By: Zetta Bills M.D.   On: 12/06/2019 12:43        Scheduled Meds: . amLODipine  10 mg Oral Daily  . buPROPion  150 mg Oral Daily  . enoxaparin (LOVENOX) injection  40 mg Subcutaneous Q24H  . folic acid  1 mg Intravenous Daily  . LORazepam  0-4 mg Intravenous Q12H  . nicotine  14 mg Transdermal Daily  . pantoprazole (PROTONIX) IV  40 mg Intravenous Daily  . sertraline  100 mg Oral Daily  . thiamine injection  100 mg Intravenous Daily   Continuous Infusions: . meropenem (MERREM) IV 1 g (12/08/19 7949)     LOS: 3 days    Time spent: 25 minutes    Sharen Hones, MD [facility]   To contact the attending provider between 7A-7P or the covering provider during after hours 7P-7A, please log into the web site [email or web address] and access using universal [place] password for that web site. If you do not have the password, please call the hospital operator.  12/08/2019, 9:00 AM

## 2019-12-08 NOTE — Plan of Care (Signed)
  Problem: Education: Goal: Knowledge of General Education information will improve Description Including pain rating scale, medication(s)/side effects and non-pharmacologic comfort measures Outcome: Progressing   

## 2019-12-08 NOTE — Care Management (Signed)
Patient to discharge Ertapenem 1 gram IV every 24 hours until 12/15/19   Medical team has set up for patient to coming daily starting Sunday to the same day surgery center, have a PIV placed and receive his medication  RNCM spoke with with Melissa in same day surgery.  She confirms that patient is to arrive prior to 7am on Sunday for his first dose.  She has already called patient and provided him with detailed instructions.    [id number] spoke with patient.  He confirms he will have transport each day to receive his medication

## 2019-12-09 DIAGNOSIS — F3110 Bipolar disorder, current episode manic without psychotic features, unspecified: Secondary | ICD-10-CM | POA: Diagnosis not present

## 2019-12-09 DIAGNOSIS — A4151 Sepsis due to Escherichia coli [E. coli]: Principal | ICD-10-CM

## 2019-12-09 LAB — CBC
HCT: 37.9 % — ABNORMAL LOW (ref 39.0–52.0)
Hemoglobin: 13.3 g/dL (ref 13.0–17.0)
MCH: 31.8 pg (ref 26.0–34.0)
MCHC: 35.1 g/dL (ref 30.0–36.0)
MCV: 90.7 fL (ref 80.0–100.0)
Platelets: 215 10*3/uL (ref 150–400)
RBC: 4.18 MIL/uL — ABNORMAL LOW (ref 4.22–5.81)
RDW: 12.3 % (ref 11.5–15.5)
WBC: 8.3 10*3/uL (ref 4.0–10.5)
nRBC: 0 % (ref 0.0–0.2)

## 2019-12-09 LAB — BASIC METABOLIC PANEL
Anion gap: 8 (ref 5–15)
BUN: 12 mg/dL (ref 6–20)
CO2: 25 mmol/L (ref 22–32)
Calcium: 8.9 mg/dL (ref 8.9–10.3)
Chloride: 100 mmol/L (ref 98–111)
Creatinine, Ser: 1.05 mg/dL (ref 0.61–1.24)
GFR calc Af Amer: >60 mL/min (ref >60)
GFR calc non Af Amer: >60 mL/min (ref >60)
Glucose, Bld: 162 mg/dL — ABNORMAL HIGH (ref 70–99)
Potassium: 3.5 mmol/L (ref 3.5–5.1)
Sodium: 133 mmol/L — ABNORMAL LOW (ref 135–145)

## 2019-12-09 LAB — MAGNESIUM: Magnesium: 2 mg/dL (ref 1.7–2.4)

## 2019-12-09 LAB — PROCALCITONIN: Procalcitonin: 1.88 ng/mL

## 2019-12-09 MED ORDER — OXYCODONE HCL 5 MG PO TABS: 5.0000 mg | ORAL_TABLET | Freq: Four times a day (QID) | ORAL | 0 refills | Status: DC | PRN

## 2019-12-09 MED ORDER — SODIUM CHLORIDE 0.9 % IV SOLN: 1.0000 g | INTRAVENOUS | 0 refills | Status: AC

## 2019-12-09 MED ORDER — SODIUM CHLORIDE 0.9 % IV SOLN: 1.0000 g | INTRAVENOUS | Status: DC

## 2019-12-09 NOTE — Care Management (Addendum)
RNCM notified by NT caregiver that MD would like patient to follow up with PCP as he does not have one. RNCM provided [facility] [phone] to NT to provide to patient. THey are closed today. RN updated. Patient to return to [facility] Same [facility] (per previous TOC note) for IV infusions. Copied from Dr. Gwenevere Ghazi note: ID ESBL e.col bacteremia Cystic pancreatitis No necrosis No pseudocyst Pt says pain better He is currently on meropenem Will get Iv ertapenem until 4/30 With h/o cocaine use will not send him with PICC line - He will come tot the day surgery to get IV ertapenem until 12/15/19 Discussed with care team Discussed with patient and he has made arrangements to come to day surgery- his room mate Grayland Ormond will drive him every day

## 2019-12-09 NOTE — Plan of Care (Signed)
  Problem: Health Behavior/Discharge Planning: Goal: Ability to manage health-related needs will improve Outcome: Not Progressing   

## 2019-12-09 NOTE — Progress Notes (Signed)
Pt was instructed about the infusion tomorrow. Discharge paper was given together with the prescription. Home medication that was kept in pharmacy was given back to pt too. Pt was sent down via wheelchair by RN.

## 2019-12-09 NOTE — Discharge Summary (Signed)
Physician Discharge Summary  Patient ID: Noah Velasquez MRN: [id number] DOB/AGE: 01-19-1966 54 y.o.  Admit date: 12/05/2019 Discharge date: 12/09/2019  Admission Diagnoses: Acute pancreatitis. Discharge Diagnoses:  Principal Problem:   Sepsis ([place]) Active Problems:   Acute on chronic pancreatitis (H[facility])   Essential hypertension   Depression E. coli septicemia.  Discharged Condition: good  Hospital Course:  Patient is a 54 year old male with a history of chronic alcoholism, chronic pancreatitis who came to the hospital complaining of abdominal pain. 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 At time of admission, patient lipase was not elevated. But he had a fever, hypotension. Received IV fluid bolus. He was also placed on broad-spectrum antibioticswith cefepime and Flagyl. CT scan of abdomen/pelvis with IV contrast showed a stranding adjacent to the pancreatic tail, chronic pancreatitis with 2 cm cystic lesion, no change since August 2020. 2 mm nonobstructive renal calculus at the left upper pole.  4/21.Blood culture pending back positive for E. coli, ID consult obtained, antibiotics switched to meropenem pendingfinal results. 4/22.  Blood culture came back with E. coli ESBL in 1 bottle.    Changed to ertapenem.  Advance diet.   #1.  Sepsis with E. coli septicemia. Blood culture came back with a 1 bottle positive for E. coli ESBL.  Procalcitonin level had dropped down significantly today.  Condition had improved.  ID has arranged for patient come back to the surgery center for infusion of ertapenem using a peripheral line.  Last dose is 4/30.  I also discussed with charge nurse, to make sure that pharmacy get the medication prescription. #2.  Acute on chronic alcoholism pancreatitis. Condition is improved.  Patient was able to tolerate a soft diet.  No nausea vomiting.  Abdominal pain is better.  However, patient still complaining some abdominal pain.  He states that he  previously had several episodes of pancreatitis, pain normally last about 7 days.  I will prescribe some pain medicine.  3.  Essential hypertension. Continue home medicines.  4.  Alcohol abuse. No evidence of withdrawal.    Patient instructions.  #1.  Low-fat diet, pain medicine as needed. 2.  IV antibiotics infusion has been arranged. #3.  Charge nurse has arranged for outpatient follow-up with the family doctor in 1 week. 4.  Follow-up with the infectious disease clinic in about a 1 week time.    Consults: GI and ID  Significant Diagnostic Studies:  CT: Lower chest: The peripheral interstitial accentuation in the lung bases is mildly increased compared to prior. Some of this is more confluent dependently and probably represents component of dependent atelectasis given the crowding of vessels. Mild cardiomegaly. Descending thoracic aortic atherosclerosis. Small hiatal hernia. Adjacent lymph nodes near the gastroesophageal junction are similar to prior and measure up to 0.8 cm in short axis, significance uncertain.  Right coronary artery atherosclerotic calcification.  Hepatobiliary: Punctate calcifications along the dome the liver favoring old granulomatous disease. Gallbladder unremarkable.  Pancreas: Considerable pancreatic calcifications favoring chronic calcific pancreatitis. Dilated dorsal pancreatic duct terminating in a 2.0 by 1.9 by 2.0 cm cystic lesion or focal dilatation on image 26/2, by my measurements this was 1.9 by 1.1 by 1.3 cm on 04/29/2016 and more recently 2.0 by 1.7 by 2.0 cm on 04/05/2019. Accordingly there has been no formal ?interval growth? since 04/05/2019. This lesion could reflect chronic residua from inflammation, or intraductal papillary mucinous neoplasm.  There is subtle stranding adjacent to the tail of the pancreas suspicious for acute focal pancreatitis. However, this is less striking  than on the 04/05/2019 exam.  Spleen:  Unremarkable  Adrenals/Urinary Tract: 1.1 by 1.2 by 1.7 cm nodule of the left adrenal gland with portal venous phase density 75 Hounsfield units and delayed phase density 48 Hounsfield units yielding a relative washout of less than 40% which is indeterminate. This did not have a significant degree of dropout of signal on out of phase images the prior MRI from 05/06/2018 and also had a density of 19 Hounsfield units on precontrast images of 04/02/2017 accordingly remaining nonspecific. However, the lack of growth of this lesion compared to the earliest available comparison of 04/29/2016 strongly favors a benign etiology.  Scattered scarring in both kidneys, right greater than left, similar pattern, distribution, appearance compared to 04/05/2019.  2 mm left kidney upper pole nonobstructive renal calculuson image 36/6.  Stomach/Bowel: Small hiatal hernia. Normal appendix.  Vascular/Lymphatic: Aortoiliac atherosclerotic vascular disease. Scattered small retroperitoneal lymph nodes are present. Right inguinal node 1.3 cm in short axis on image 81/2, formerly the same. Right external iliac node along the right inguinal ring, 0.8 cm in short axis on image 73/2.  Reproductive: Unremarkable  Other: Subtle presacral stranding is unchanged from the prior exam.  Musculoskeletal: Very small umbilical hernia containing adipose tissue and a margin of a small bowel loop without complicating feature. Small indirect right inguinal hernia contains adipose tissue.  Markedly severe and asymmetric degenerative arthropathy of the right hip.  Lumbar spondylosis, degenerative disc disease, and congenitally short pedicles contributing to suspected impingement at multiple lumbar levels.  IMPRESSION: 1. Subtle stranding adjacent to the tail of the pancreas suspicious for acute focal pancreatitis. However, this is less striking than on the 04/05/2019 exam. 2. Chronic calcific  pancreatitis with a dilated dorsal pancreatic duct terminating in a 2.0 cm cystic lesion or focal dilatation on image 26/2, but without formal interval growth since 04/05/2019. This could reflect chronic residua from inflammation, or intraductal papillary mucinous neoplasm. Surveillance imaging is suggested by pancreatic protocol CT or MRI in 2 years time. This recommendation follows ACR consensus guidelines: Management of Incidental Pancreatic Cysts: A White Paper of the ACR Incidental Findings Committee. J Am Coll Radiol 0383;33:832-919. 3. Other imaging findings of potential clinical significance: Coronary atherosclerosis. Mild cardiomegaly. Small hiatal hernia. Small indirect right inguinal hernia containing adipose tissue. Markedly severe and asymmetric degenerative arthropathy of the right hip. Lumbar spondylosis, degenerative disc disease, and congenitally short pedicles contributing to suspected impingement at multiple lumbar levels. 4. 2 mm left kidney upper pole nonobstructive renal calculus. 5. Aortic atherosclerosis.  Aortic Atherosclerosis (ICD10-I70.0).  U/S: FINDINGS: Gallbladder:  No gallstones or wall thickening visualized. No sonographic Murphy sign noted by sonographer.  Common bile duct:  Diameter: 3.7 mm  Liver:  No focal lesion identified. Within normal limits in parenchymal echogenicity, lobular hepatic contours. Portal vein is patent on color Doppler imaging with normal direction of blood flow towards the liver.  Other: Pancreatic calcifications.  Pancreas not well visualized.  IMPRESSION: 1. No ultrasound signs of acute cholecystitis. No biliary ductal dilation. 2. Changes of pancreatic ductal and parenchymal calcifications partially imaged. 3. Lobular hepatic contours, correlate with clinical or laboratory evidence of liver disease.   Treatments: Antibiotics, IV fluids.  Discharge Exam: Blood pressure (!) 140/97, pulse 86,  temperature 97.8 F (36.6 C), temperature source Oral, resp. rate 17, height 5' 7" (1.702 m), weight 74.7 kg, SpO2 97 %. General appearance: alert and cooperative Resp: clear to auscultation bilaterally Cardio: regular rate and rhythm, S1, S2 normal, no murmur, click, rub  or gallop GI: Mild tenderness at left abdomen, no rebound, bowel sounds normal Extremities: extremities normal, atraumatic, no cyanosis or edema Neurologic: Grossly normal  Disposition: Discharge disposition: 01-Home or Self Care       Discharge Instructions    [facility] pharmacist to adjust dose for Vancomycin, Aminoglycosides and other anti-infective therapies as requested by physician.   Complete by: As directed    [facility] to provide Cath Flo 61m   Complete by: As directed    Administer for PICC line occlusion and as ordered by physician for other access device issues.   Anaphylaxis Kit: Provided to treat any anaphylactic reaction to the medication being provided to the patient if First Dose or when requested by physician   Complete by: As directed    Epinephrine 128mml vial / amp: Administer 0.64m64m0.64ml4mubcutaneously once for moderate to severe anaphylaxis, nurse to call physician and pharmacy when reaction occurs and call 911 if needed for immediate care   Diphenhydramine 50mg80mIV vial: Administer 25-50mg 50mM PRN for first dose reaction, rash, itching, mild reaction, nurse to call physician and pharmacy when reaction occurs   Sodium Chloride 0.9% NS 500ml I60mdminister if needed for hypovolemic blood pressure drop or as ordered by physician after call to physician with anaphylactic reaction   Change dressing on IV access line weekly and PRN   Complete by: As directed    Diet - low sodium heart healthy   Complete by: As directed    Low fat diet   Flush IV access with Sodium Chloride 0.9% and Heparin 10 units/ml or 100 units/ml   Complete by: As directed    Home infusion  instructions - [facility]   Complete by: As directed    Instructions: Flush IV access with Sodium Chloride 0.9% and Heparin 10units/ml or 100units/ml   Change dressing on IV access line: Weekly and PRN   Instructions Cath Flo 2mg: Ad78mister for PICC Line occlusion and as ordered by physician for other access device   [facility] pharmacist to adjust dose for: Vancomycin, Aminoglycosides and other anti-infective therapies as requested by physician   Increase activity slowly   Complete by: As directed    Method of administration may be changed at the discretion of home infusion pharmacist based upon assessment of the patient and/or caregiver's ability to self-administer the medication ordered   Complete by: As directed    Outpatient Parenteral Antibiotic Therapy Information Antibiotic: Ertapenem (Invanz) IVPB; Indications for use: E Coli ESBL; End Date: 12/16/2019   Complete by: As directed    Antibiotic: Ertapenem (Invanz) IVPB   Indications for use: E Coli ESBL   End Date: 12/16/2019     Allergies as of 12/09/2019      Reactions   Hctz [hydrochlorothiazide] Swelling      Medication List    TAKE these medications   amLODipine 10 MG tablet Commonly known as: NORVASC TAKE 1 TABLET BY MOUTH EVERY DAY   buPROPion 150 MG 24 hr tablet Commonly known as: Wellbutrin XL Take 1 tablet (150 mg total) by mouth daily.   ertapenem 1,000 mg in sodium chloride 0.9 % 100 mL Inject 1,000 mg into the vein daily for 6 days. Start taking on: December 10, 2019   famotidine 20 MG tablet Commonly known as: PEPCID TAKE 1 TABLET BY MOUTH TWICE A DAY   fluticasone 50 MCG/ACT nasal spray Commonly known as: FLONASE Place 1 spray into both nostrils daily as needed for  allergies or rhinitis.   gabapentin 100 MG capsule Commonly known as: NEURONTIN Take 1 capsule (100 mg total) by mouth at bedtime as needed (sleep).   loratadine 10 MG tablet Commonly known as: CLARITIN Take 1 tablet  (10 mg total) by mouth daily.   losartan 100 MG tablet Commonly known as: COZAAR Take 1 tablet (100 mg total) by mouth daily.   nicotine 14 mg/24hr patch Commonly known as: NICODERM CQ - dosed in mg/24 hours Place 1 patch (14 mg total) onto the skin daily.   ondansetron 4 MG disintegrating tablet Commonly known as: Zofran ODT Take 1 tablet (4 mg total) by mouth every 8 (eight) hours as needed.   oxyCODONE 5 MG immediate release tablet Commonly known as: Oxy IR/ROXICODONE Take 1 tablet (5 mg total) by mouth every 6 (six) hours as needed for severe pain. What changed: when to take this   sertraline 100 MG tablet Commonly known as: ZOLOFT TAKE 1 TABLET BY MOUTH EVERY DAY   Shingrix injection Generic drug: Zoster Vaccine Adjuvanted Shingrix (PF) 50 mcg/0.5 mL intramuscular suspension, kit  TO BE ADMINISTERED BY PHARMACIST FOR IMMUNIZATION (2ND DOSE IN 2 TO 6 MONTHS)   traZODone 150 MG tablet Commonly known as: DESYREL Take 1 tablet (150 mg total) by mouth at bedtime as needed for sleep.   Zenpep 15000-47000 units Cpep Generic drug: Pancrelipase (Lip-Prot-Amyl) TAKE 1 CAPSULE BY MOUTH 3 (THREE) TIMES DAILY WITH MEALS.            Discharge Care Instructions  (From admission, onward)         Start     Ordered   12/09/19 0000  Change dressing on IV access line weekly and PRN  (Home infusion instructions - [facility] )     12/09/19 0923         Follow-up Information    [facility] Follow up in 1 week(s).          More than 30 minutes  Signed: Sharen Hones 12/09/2019, 9:53 AM

## 2019-12-10 ENCOUNTER — Ambulatory Visit: Admit: 2019-12-10 | Payer: Medicaid Other

## 2019-12-10 LAB — CULTURE, BLOOD (ROUTINE X 2)
Culture: NO GROWTH
Special Requests: ADEQUATE

## 2019-12-11 ENCOUNTER — Other Ambulatory Visit: Payer: Self-pay | Admitting: Nurse Practitioner

## 2019-12-11 ENCOUNTER — Other Ambulatory Visit: Payer: Self-pay | Admitting: Infectious Diseases

## 2019-12-11 ENCOUNTER — Ambulatory Visit: Payer: Medicaid Other

## 2019-12-11 DIAGNOSIS — I10 Essential (primary) hypertension: Secondary | ICD-10-CM

## 2019-12-11 DIAGNOSIS — F4310 Post-traumatic stress disorder, unspecified: Secondary | ICD-10-CM | POA: Diagnosis not present

## 2019-12-11 DIAGNOSIS — M87051 Idiopathic aseptic necrosis of right femur: Secondary | ICD-10-CM

## 2019-12-11 MED ORDER — SULFAMETHOXAZOLE-TRIMETHOPRIM 800-160 MG PO TABS: 1.0000 | ORAL_TABLET | Freq: Two times a day (BID) | ORAL | 0 refills | Status: DC

## 2019-12-11 NOTE — Progress Notes (Signed)
Pt did not show up for same day surgery IV antibiotic- he called and let them that he did not have a ride- I called him today and spoke to him. He assured me he will be going for IV tomorrow as he has a ride. I  sent a prescription for PO bactrim DS to be taken morning and evening until he is able to go tomorrow for IV ertapenem for ESBL e.coli bacteremia

## 2019-12-12 ENCOUNTER — Ambulatory Visit
Admission: RE | Admit: 2019-12-12 | Discharge: 2019-12-12 | Disposition: A | Discharge status: Home / Self Care | Payer: Medicaid Other | Source: Ambulatory Visit | Attending: Infectious Diseases | Admitting: Infectious Diseases

## 2019-12-12 ENCOUNTER — Other Ambulatory Visit: Payer: Self-pay | Admitting: Nurse Practitioner

## 2019-12-12 DIAGNOSIS — F4310 Post-traumatic stress disorder, unspecified: Secondary | ICD-10-CM | POA: Diagnosis not present

## 2019-12-12 DIAGNOSIS — J302 Other seasonal allergic rhinitis: Secondary | ICD-10-CM

## 2019-12-12 NOTE — Progress Notes (Signed)
Patient called to cancel IV antibiotic for today. Stressed to patient to go pick up his oral antibiotic. Patient stated he would. Messaging ordering provider of cancellation.

## 2019-12-12 NOTE — Telephone Encounter (Signed)
Attempted to call patient to schedule medication follow up- left message to call back to schedule appointment- courtesy RF given

## 2019-12-13 ENCOUNTER — Other Ambulatory Visit: Payer: Self-pay

## 2019-12-13 ENCOUNTER — Ambulatory Visit
Admission: RE | Admit: 2019-12-13 | Discharge: 2019-12-13 | Disposition: A | Discharge status: Home / Self Care | Payer: Medicaid Other | Source: Ambulatory Visit | Attending: Infectious Diseases | Admitting: Infectious Diseases

## 2019-12-13 DIAGNOSIS — A498 Other bacterial infections of unspecified site: Secondary | ICD-10-CM | POA: Diagnosis not present

## 2019-12-13 DIAGNOSIS — Z1612 Extended spectrum beta lactamase (ESBL) resistance: Secondary | ICD-10-CM | POA: Diagnosis not present

## 2019-12-13 DIAGNOSIS — R7881 Bacteremia: Secondary | ICD-10-CM | POA: Diagnosis not present

## 2019-12-13 DIAGNOSIS — F4310 Post-traumatic stress disorder, unspecified: Secondary | ICD-10-CM | POA: Diagnosis not present

## 2019-12-13 LAB — CBC WITH DIFFERENTIAL/PLATELET
Abs Immature Granulocytes: 0.05 10*3/uL (ref 0.00–0.07)
Basophils Absolute: 0.1 10*3/uL (ref 0.0–0.1)
Basophils Relative: 1 %
Eosinophils Absolute: 0.4 10*3/uL (ref 0.0–0.5)
Eosinophils Relative: 4 %
HCT: 37.6 % — ABNORMAL LOW (ref 39.0–52.0)
Hemoglobin: 13 g/dL (ref 13.0–17.0)
Immature Granulocytes: 1 %
Lymphocytes Relative: 22 %
Lymphs Abs: 2.4 10*3/uL (ref 0.7–4.0)
MCH: 31.8 pg (ref 26.0–34.0)
MCHC: 34.6 g/dL (ref 30.0–36.0)
MCV: 91.9 fL (ref 80.0–100.0)
Monocytes Absolute: 0.8 10*3/uL (ref 0.1–1.0)
Monocytes Relative: 7 %
Neutro Abs: 7 10*3/uL (ref 1.7–7.7)
Neutrophils Relative %: 65 %
Platelets: 301 10*3/uL (ref 150–400)
RBC: 4.09 MIL/uL — ABNORMAL LOW (ref 4.22–5.81)
RDW: 12.6 % (ref 11.5–15.5)
WBC: 10.7 10*3/uL — ABNORMAL HIGH (ref 4.0–10.5)
nRBC: 0 % (ref 0.0–0.2)

## 2019-12-13 LAB — COMPREHENSIVE METABOLIC PANEL
ALT: 14 U/L (ref 0–44)
AST: 21 U/L (ref 15–41)
Albumin: 3.4 g/dL — ABNORMAL LOW (ref 3.5–5.0)
Alkaline Phosphatase: 65 U/L (ref 38–126)
Anion gap: 7 (ref 5–15)
BUN: 11 mg/dL (ref 6–20)
CO2: 23 mmol/L (ref 22–32)
Calcium: 8.8 mg/dL — ABNORMAL LOW (ref 8.9–10.3)
Chloride: 101 mmol/L (ref 98–111)
Creatinine, Ser: 1.05 mg/dL (ref 0.61–1.24)
GFR calc Af Amer: >60 mL/min (ref >60)
GFR calc non Af Amer: >60 mL/min (ref >60)
Glucose, Bld: 120 mg/dL — ABNORMAL HIGH (ref 70–99)
Potassium: 3.9 mmol/L (ref 3.5–5.1)
Sodium: 131 mmol/L — ABNORMAL LOW (ref 135–145)
Total Bilirubin: 0.9 mg/dL (ref 0.3–1.2)
Total Protein: 8.2 g/dL — ABNORMAL HIGH (ref 6.5–8.1)

## 2019-12-13 MED ORDER — SODIUM CHLORIDE 0.9 % IV SOLN
1.0000 g | Freq: Once | INTRAVENOUS | Status: AC
  Administered 2019-12-13: 11:00:00 1000 mg via INTRAVENOUS
  Filled 2019-12-13: qty 1

## 2019-12-14 ENCOUNTER — Ambulatory Visit
Admission: RE | Admit: 2019-12-14 | Discharge: 2019-12-14 | Disposition: A | Discharge status: Home / Self Care | Payer: Medicaid Other | Source: Ambulatory Visit | Attending: Infectious Diseases | Admitting: Infectious Diseases

## 2019-12-14 DIAGNOSIS — A498 Other bacterial infections of unspecified site: Secondary | ICD-10-CM | POA: Diagnosis not present

## 2019-12-14 DIAGNOSIS — F4310 Post-traumatic stress disorder, unspecified: Secondary | ICD-10-CM | POA: Diagnosis not present

## 2019-12-14 DIAGNOSIS — R7881 Bacteremia: Secondary | ICD-10-CM | POA: Insufficient documentation

## 2019-12-14 MED ORDER — SODIUM CHLORIDE 0.9 % IV SOLN
1.0000 g | INTRAVENOUS | Status: DC
  Administered 2019-12-14: 10:00:00 1000 mg via INTRAVENOUS
  Filled 2019-12-14 (×2): qty 1

## 2019-12-15 ENCOUNTER — Ambulatory Visit
Admission: RE | Admit: 2019-12-15 | Discharge: 2019-12-15 | Disposition: A | Discharge status: Home / Self Care | Payer: Medicaid Other | Source: Ambulatory Visit | Attending: Infectious Diseases | Admitting: Infectious Diseases

## 2019-12-15 ENCOUNTER — Other Ambulatory Visit: Payer: Self-pay

## 2019-12-15 DIAGNOSIS — R7881 Bacteremia: Secondary | ICD-10-CM | POA: Diagnosis not present

## 2019-12-15 DIAGNOSIS — F4310 Post-traumatic stress disorder, unspecified: Secondary | ICD-10-CM | POA: Diagnosis not present

## 2019-12-15 DIAGNOSIS — Z1612 Extended spectrum beta lactamase (ESBL) resistance: Secondary | ICD-10-CM | POA: Diagnosis not present

## 2019-12-15 DIAGNOSIS — A498 Other bacterial infections of unspecified site: Secondary | ICD-10-CM | POA: Diagnosis not present

## 2019-12-15 DIAGNOSIS — F3110 Bipolar disorder, current episode manic without psychotic features, unspecified: Secondary | ICD-10-CM | POA: Diagnosis not present

## 2019-12-15 MED ORDER — SODIUM CHLORIDE FLUSH 0.9 % IV SOLN
INTRAVENOUS | Status: AC
  Administered 2019-12-15: 11:00:00 1 mL
  Filled 2019-12-15: qty 10

## 2019-12-15 MED ORDER — SODIUM CHLORIDE 0.9 % IV SOLN
1.0000 g | INTRAVENOUS | Status: DC
  Administered 2019-12-15: 11:00:00 1000 mg via INTRAVENOUS
  Filled 2019-12-15 (×2): qty 1

## 2019-12-15 NOTE — OR Nursing (Signed)
Attempted to call pt. Without any answer because he was late for appt.

## 2019-12-16 ENCOUNTER — Ambulatory Visit
Admission: RE | Admit: 2019-12-16 | Discharge: 2019-12-16 | Disposition: A | Discharge status: Home / Self Care | Payer: Medicaid Other | Source: Ambulatory Visit | Attending: Infectious Diseases | Admitting: Infectious Diseases

## 2019-12-16 DIAGNOSIS — R7881 Bacteremia: Secondary | ICD-10-CM | POA: Diagnosis not present

## 2019-12-16 DIAGNOSIS — A498 Other bacterial infections of unspecified site: Secondary | ICD-10-CM | POA: Insufficient documentation

## 2019-12-16 DIAGNOSIS — Z1612 Extended spectrum beta lactamase (ESBL) resistance: Secondary | ICD-10-CM | POA: Diagnosis not present

## 2019-12-16 DIAGNOSIS — F3110 Bipolar disorder, current episode manic without psychotic features, unspecified: Secondary | ICD-10-CM | POA: Diagnosis not present

## 2019-12-16 MED ORDER — SODIUM CHLORIDE 0.9 % IV SOLN
1.0000 g | INTRAVENOUS | Status: DC
  Administered 2019-12-16: 1000 mg via INTRAVENOUS
  Filled 2019-12-16 (×2): qty 1

## 2019-12-17 ENCOUNTER — Ambulatory Visit
Admission: RE | Admit: 2019-12-17 | Discharge: 2019-12-17 | Disposition: A | Discharge status: Home / Self Care | Payer: Medicaid Other | Source: Ambulatory Visit | Attending: Infectious Diseases | Admitting: Infectious Diseases

## 2019-12-17 DIAGNOSIS — R7881 Bacteremia: Secondary | ICD-10-CM | POA: Diagnosis not present

## 2019-12-17 DIAGNOSIS — A498 Other bacterial infections of unspecified site: Secondary | ICD-10-CM | POA: Diagnosis not present

## 2019-12-17 DIAGNOSIS — Z1612 Extended spectrum beta lactamase (ESBL) resistance: Secondary | ICD-10-CM | POA: Insufficient documentation

## 2019-12-17 MED ORDER — SODIUM CHLORIDE 0.9 % IV SOLN
1.0000 g | INTRAVENOUS | Status: DC
  Administered 2019-12-17: 1000 mg via INTRAVENOUS
  Filled 2019-12-17 (×2): qty 1

## 2019-12-18 ENCOUNTER — Ambulatory Visit
Admission: RE | Admit: 2019-12-18 | Discharge: 2019-12-18 | Disposition: A | Discharge status: Home / Self Care | Payer: Medicaid Other | Source: Ambulatory Visit | Attending: Infectious Diseases | Admitting: Infectious Diseases

## 2019-12-18 ENCOUNTER — Other Ambulatory Visit: Payer: Self-pay

## 2019-12-18 DIAGNOSIS — A498 Other bacterial infections of unspecified site: Secondary | ICD-10-CM | POA: Insufficient documentation

## 2019-12-18 DIAGNOSIS — R7881 Bacteremia: Secondary | ICD-10-CM | POA: Diagnosis not present

## 2019-12-18 DIAGNOSIS — Z1612 Extended spectrum beta lactamase (ESBL) resistance: Secondary | ICD-10-CM | POA: Insufficient documentation

## 2019-12-18 DIAGNOSIS — F4310 Post-traumatic stress disorder, unspecified: Secondary | ICD-10-CM | POA: Diagnosis not present

## 2019-12-18 DIAGNOSIS — B9620 Unspecified Escherichia coli [E. coli] as the cause of diseases classified elsewhere: Secondary | ICD-10-CM | POA: Insufficient documentation

## 2019-12-18 MED ORDER — SODIUM CHLORIDE 0.9 % IV SOLN
1.0000 g | INTRAVENOUS | Status: DC
  Administered 2019-12-18: 1000 mg via INTRAVENOUS
  Filled 2019-12-18 (×2): qty 1

## 2019-12-19 ENCOUNTER — Ambulatory Visit
Admission: RE | Admit: 2019-12-19 | Discharge: 2019-12-19 | Disposition: A | Discharge status: Home / Self Care | Payer: Medicaid Other | Source: Ambulatory Visit | Attending: Infectious Diseases | Admitting: Infectious Diseases

## 2019-12-19 ENCOUNTER — Other Ambulatory Visit: Payer: Self-pay

## 2019-12-19 DIAGNOSIS — R7881 Bacteremia: Secondary | ICD-10-CM | POA: Insufficient documentation

## 2019-12-19 DIAGNOSIS — Z1612 Extended spectrum beta lactamase (ESBL) resistance: Secondary | ICD-10-CM | POA: Insufficient documentation

## 2019-12-19 DIAGNOSIS — F4310 Post-traumatic stress disorder, unspecified: Secondary | ICD-10-CM | POA: Diagnosis not present

## 2019-12-19 DIAGNOSIS — A498 Other bacterial infections of unspecified site: Secondary | ICD-10-CM | POA: Insufficient documentation

## 2019-12-19 LAB — CBC
HCT: 38.3 % — ABNORMAL LOW (ref 39.0–52.0)
Hemoglobin: 12.7 g/dL — ABNORMAL LOW (ref 13.0–17.0)
MCH: 31.7 pg (ref 26.0–34.0)
MCHC: 33.2 g/dL (ref 30.0–36.0)
MCV: 95.5 fL (ref 80.0–100.0)
Platelets: 417 10*3/uL — ABNORMAL HIGH (ref 150–400)
RBC: 4.01 MIL/uL — ABNORMAL LOW (ref 4.22–5.81)
RDW: 12.7 % (ref 11.5–15.5)
WBC: 7 10*3/uL (ref 4.0–10.5)
nRBC: 0 % (ref 0.0–0.2)

## 2019-12-19 LAB — COMPREHENSIVE METABOLIC PANEL
ALT: 27 U/L (ref 0–44)
AST: 29 U/L (ref 15–41)
Albumin: 3.4 g/dL — ABNORMAL LOW (ref 3.5–5.0)
Alkaline Phosphatase: 94 U/L (ref 38–126)
Anion gap: 7 (ref 5–15)
BUN: 15 mg/dL (ref 6–20)
CO2: 21 mmol/L — ABNORMAL LOW (ref 22–32)
Calcium: 8.8 mg/dL — ABNORMAL LOW (ref 8.9–10.3)
Chloride: 105 mmol/L (ref 98–111)
Creatinine, Ser: 1.39 mg/dL — ABNORMAL HIGH (ref 0.61–1.24)
GFR calc Af Amer: >60 mL/min (ref >60)
GFR calc non Af Amer: 57 mL/min — ABNORMAL LOW (ref >60)
Glucose, Bld: 176 mg/dL — ABNORMAL HIGH (ref 70–99)
Potassium: 4.3 mmol/L (ref 3.5–5.1)
Sodium: 133 mmol/L — ABNORMAL LOW (ref 135–145)
Total Bilirubin: 0.5 mg/dL (ref 0.3–1.2)
Total Protein: 7.9 g/dL (ref 6.5–8.1)

## 2019-12-19 MED ORDER — SODIUM CHLORIDE 0.9 % IV SOLN
1.0000 g | Freq: Once | INTRAVENOUS | Status: AC
  Administered 2019-12-19: 1000 mg via INTRAVENOUS
  Filled 2019-12-19: qty 1

## 2019-12-20 DIAGNOSIS — F4310 Post-traumatic stress disorder, unspecified: Secondary | ICD-10-CM | POA: Diagnosis not present

## 2019-12-21 DIAGNOSIS — F4310 Post-traumatic stress disorder, unspecified: Secondary | ICD-10-CM | POA: Diagnosis not present

## 2019-12-22 DIAGNOSIS — F4310 Post-traumatic stress disorder, unspecified: Secondary | ICD-10-CM | POA: Diagnosis not present

## 2019-12-22 DIAGNOSIS — F3110 Bipolar disorder, current episode manic without psychotic features, unspecified: Secondary | ICD-10-CM | POA: Diagnosis not present

## 2019-12-23 DIAGNOSIS — F3110 Bipolar disorder, current episode manic without psychotic features, unspecified: Secondary | ICD-10-CM | POA: Diagnosis not present

## 2019-12-29 DIAGNOSIS — F3110 Bipolar disorder, current episode manic without psychotic features, unspecified: Secondary | ICD-10-CM | POA: Diagnosis not present

## 2019-12-30 DIAGNOSIS — F3110 Bipolar disorder, current episode manic without psychotic features, unspecified: Secondary | ICD-10-CM | POA: Diagnosis not present

## 2020-01-01 NOTE — Addendum Note (Signed)
Encounter addended by: Kathyrn Drown, RN on: 01/01/2020 9:56 AM  Actions taken: Charge Capture section accepted

## 2020-01-02 ENCOUNTER — Other Ambulatory Visit: Payer: Self-pay | Admitting: Nurse Practitioner

## 2020-01-02 DIAGNOSIS — I10 Essential (primary) hypertension: Secondary | ICD-10-CM

## 2020-01-02 NOTE — Telephone Encounter (Signed)
Requested medication (s) are due for refill today: yes  Requested medication (s) are on the active medication list:yes  Last refill: 09/28/19 Cassell Smiles  Future visit scheduled: No Attempted to contact patient left VM to return call to office for scheduling  Notes to clinic:  Last OV 8 months ago    Requested Prescriptions  Pending Prescriptions Disp Refills   amLODipine (NORVASC) 10 MG tablet [Pharmacy Med Name: AMLODIPINE BESYLATE 10 MG TAB] 90 tablet 1    Sig: TAKE 1 TABLET BY MOUTH EVERY DAY      Cardiovascular:  Calcium Channel Blockers Failed - 01/02/2020  4:14 PM      Failed - Last BP in normal range    BP Readings from Last 1 Encounters:  12/09/19 (!) 140/97          Failed - Valid encounter within last 6 months    Recent Outpatient Visits           8 months ago Ganglion cyst of wrist, left   [facility] Merrilyn Puma, Jerrel Ivory, NP   8 months ago PTSD (post-traumatic stress disorder)   [facility] Mikey College, NP   1 year ago Major depressive disorder with current active episode, unspecified depression episode severity, unspecified whether recurrent   [facility] Mikey College, NP   1 year ago Preoperative evaluation to rule out surgical contraindication   [facility] Mikey College, NP   1 year ago Avascular necrosis of bone of right hip [facility])   [facility] Merrilyn Puma, Jerrel Ivory, NP

## 2020-01-02 NOTE — Telephone Encounter (Signed)
Left message to call back for appointment.  

## 2020-01-05 ENCOUNTER — Other Ambulatory Visit: Payer: Self-pay | Admitting: Family Medicine

## 2020-01-05 ENCOUNTER — Telehealth: Payer: Self-pay

## 2020-01-05 DIAGNOSIS — F3110 Bipolar disorder, current episode manic without psychotic features, unspecified: Secondary | ICD-10-CM | POA: Diagnosis not present

## 2020-01-05 DIAGNOSIS — I10 Essential (primary) hypertension: Secondary | ICD-10-CM

## 2020-01-05 MED ORDER — AMLODIPINE BESYLATE 10 MG PO TABS: 10.0000 mg | ORAL_TABLET | Freq: Every day | ORAL | 0 refills | Status: DC

## 2020-01-05 NOTE — Telephone Encounter (Signed)
Pt asked for a courtesy refill for amLODipine (NORVASC) 10 MG tablet  Until his appt next week/ he is out of medication and states he missed his last appt due to being hospitalized / please advise

## 2020-01-05 NOTE — Telephone Encounter (Signed)
Refill sent to the pharmacy for enough medication to last him until his next appt on 01/10/2020.

## 2020-01-06 DIAGNOSIS — F3110 Bipolar disorder, current episode manic without psychotic features, unspecified: Secondary | ICD-10-CM | POA: Diagnosis not present

## 2020-01-08 DIAGNOSIS — F4310 Post-traumatic stress disorder, unspecified: Secondary | ICD-10-CM | POA: Diagnosis not present

## 2020-01-09 DIAGNOSIS — F4310 Post-traumatic stress disorder, unspecified: Secondary | ICD-10-CM | POA: Diagnosis not present

## 2020-01-10 ENCOUNTER — Other Ambulatory Visit: Payer: Self-pay

## 2020-01-10 ENCOUNTER — Ambulatory Visit (INDEPENDENT_AMBULATORY_CARE_PROVIDER_SITE_OTHER): Payer: Medicaid Other | Admitting: Family Medicine

## 2020-01-10 ENCOUNTER — Encounter: Payer: Self-pay | Admitting: Family Medicine

## 2020-01-10 VITALS — BP 140/82 | HR 80 | Resp 16 | Ht 67.0 in

## 2020-01-10 DIAGNOSIS — Z7689 Persons encountering health services in other specified circumstances: Secondary | ICD-10-CM | POA: Diagnosis not present

## 2020-01-10 DIAGNOSIS — R7309 Other abnormal glucose: Secondary | ICD-10-CM

## 2020-01-10 DIAGNOSIS — M87051 Idiopathic aseptic necrosis of right femur: Secondary | ICD-10-CM

## 2020-01-10 DIAGNOSIS — J302 Other seasonal allergic rhinitis: Secondary | ICD-10-CM

## 2020-01-10 DIAGNOSIS — N529 Male erectile dysfunction, unspecified: Secondary | ICD-10-CM | POA: Diagnosis not present

## 2020-01-10 DIAGNOSIS — R634 Abnormal weight loss: Secondary | ICD-10-CM | POA: Diagnosis not present

## 2020-01-10 DIAGNOSIS — R635 Abnormal weight gain: Secondary | ICD-10-CM | POA: Diagnosis not present

## 2020-01-10 DIAGNOSIS — I10 Essential (primary) hypertension: Secondary | ICD-10-CM | POA: Diagnosis not present

## 2020-01-10 DIAGNOSIS — F4310 Post-traumatic stress disorder, unspecified: Secondary | ICD-10-CM | POA: Diagnosis not present

## 2020-01-10 LAB — POCT GLYCOSYLATED HEMOGLOBIN (HGB A1C): Hemoglobin A1C: 5.2 % (ref 4.0–5.6)

## 2020-01-10 MED ORDER — BLOOD PRESSURE KIT: PACK | 0 refills | Status: DC

## 2020-01-10 MED ORDER — TADALAFIL 5 MG PO TABS: 5.0000 mg | ORAL_TABLET | Freq: Every day | ORAL | 0 refills | Status: DC | PRN

## 2020-01-10 MED ORDER — LOSARTAN POTASSIUM 100 MG PO TABS: 100.0000 mg | ORAL_TABLET | Freq: Every day | ORAL | 1 refills | Status: DC

## 2020-01-10 MED ORDER — FLUTICASONE PROPIONATE 50 MCG/ACT NA SUSP: 1.0000 | Freq: Every day | NASAL | 1 refills | Status: DC | PRN

## 2020-01-10 MED ORDER — GABAPENTIN 100 MG PO CAPS: 100.0000 mg | ORAL_CAPSULE | Freq: Every evening | ORAL | 1 refills | Status: DC | PRN

## 2020-01-10 MED ORDER — AMLODIPINE BESYLATE 10 MG PO TABS: 10.0000 mg | ORAL_TABLET | Freq: Every day | ORAL | 1 refills | Status: DC

## 2020-01-10 NOTE — Progress Notes (Signed)
Subjective:    Patient ID: Noah Velasquez, male    DOB: 05-10-1966, 54 y.o.   MRN: [id number]  Noah Velasquez is a 54 y.o. male presenting on 01/10/2020 for Erectile Dysfunction and Hypertension   HPI  Hypertension - He is not checking BP at home or outside of clinic.    - Current medications: losartan 141m daily, tolerating well without side effects - He is not currently symptomatic. - Pt denies headache, lightheadedness, dizziness, changes in vision, chest tightness/pressure, palpitations, leg swelling, sudden loss of speech or loss of consciousness. - He  reports no regular exercise routine. - His diet is high in salt, high in fat, and high in carbohydrates.  [name] requesting to discuss erectile dysfunction medications.  Reports has taken over the counter supplements in the past with moderate relief of erectile dysfunction but is interested in trying a prescription for Cialis.  [facility] 01/10/2020 04/14/2019 10/20/2018  Decreased Interest 0 0 1  Down, Depressed, Hopeless 0 0 0  PHQ - 2 Score 0 0 1  Altered sleeping 1 0 2  Tired, decreased energy _0 Change in appetite 2 0 1  Feeling bad or failure about yourself  0 0 0  Trouble concentrating 0 0 2  Moving slowly or fidgety/restless 0 0 0  Suicidal thoughts 0 0 0  PHQ-9 Score _1 Difficult doing work/chores Not difficult at all Not difficult at all Not difficult at all  Some encounter information is confidential and restricted. Go to Review Flowsheets activity to see all data.    Social History   Tobacco Use  . Smoking status: Current Every Day Smoker    Packs/day: 0.25    Years: 15.00    Pack years: 3.75    Types: Cigarettes  . Smokeless tobacco: Never Used  Substance Use Topics  . Alcohol use: Yes    Comment: 80 ounces a week  . Drug use: Yes    Types: Marijuana    Review of Systems  Constitutional: Negative.   HENT: Negative.   Eyes: Negative.   Respiratory: Negative.     Cardiovascular: Negative.   Gastrointestinal: Negative.   Endocrine: Negative.   Genitourinary: Negative.   Musculoskeletal: Negative.   Skin: Negative.   Allergic/Immunologic: Negative.   Neurological: Negative.   Hematological: Negative.   Psychiatric/Behavioral: Negative.    Per HPI unless specifically indicated above     Objective:    BP 140/82 (BP Location: Left Arm, Patient Position: Sitting, Cuff Size: Normal)   Pulse 80   Resp 16   Ht _2  (1.702 m)   SpO2 100%   BMI 25.90 kg/m   Wt Readings from Last 3 Encounters:  12/09/19 164 lb 11.2 oz (74.7 kg)  10/31/19 184 lb (83.5 kg)  04/19/19 187 lb 6.4 oz (85 kg)    Physical Exam Vitals reviewed.  Constitutional:      General: He is not in acute distress.    Appearance: Normal appearance. He is well-developed and well-groomed. He is not ill-appearing or toxic-appearing.  HENT:     Head: Normocephalic.  Eyes:     General: Lids are normal. Vision grossly intact.        Right eye: No discharge.        Left eye: No discharge.     Extraocular Movements: Extraocular movements intact.     Conjunctiva/sclera: Conjunctivae normal.     Pupils: Pupils are equal, round, and reactive to light.  Neck:     Thyroid: No thyroid mass, thyromegaly or thyroid tenderness.  Cardiovascular:     Rate and Rhythm: Normal rate and regular rhythm.     Pulses: Normal pulses.     Heart sounds: Normal heart sounds. No murmur. No friction rub. No gallop.   Pulmonary:     Effort: Pulmonary effort is normal. No respiratory distress.     Breath sounds: Normal breath sounds.  Skin:    General: Skin is warm and dry.     Capillary Refill: Capillary refill takes less than 2 seconds.  Neurological:     General: No focal deficit present.     Mental Status: He is alert and oriented to person, place, and time.     Cranial Nerves: No cranial nerve deficit.     Sensory: No sensory deficit.     Motor: No weakness.     Coordination: Coordination  normal.     Comments: Unable to assess gait, using motorized wheelchair  Psychiatric:        Attention and Perception: Attention and perception normal.        Mood and Affect: Mood and affect normal.        Speech: Speech normal.        Behavior: Behavior normal. Behavior is cooperative.        Thought Content: Thought content normal.        Cognition and Memory: Cognition and memory normal.    Results for orders placed or performed in visit on 01/10/20  POCT HgB A1C  Result Value Ref Range   Hemoglobin A1C 5.2 4.0 - 5.6 %   HbA1c POC (<> result, manual entry)     HbA1c, POC (prediabetic range)     HbA1c, POC (controlled diabetic range)        Assessment & Plan:   Problem List Items Addressed This Visit      Cardiovascular and Mediastinum   Essential hypertension - Primary    Uncontrolled hypertension.  BP is not at goal < 130/80.  Pt is not working on lifestyle modifications.  Taking medications tolerating well without side effects. Complications: Chronic pancreatitis, CKD stage 3, history of acute renal failure, tobacco use disorder, alcohol use disorder, chronic pain.  Plan: 1. Continue taking losartan 122m daily 2. Obtain labs today  3. Encouraged heart healthy diet and increasing exercise to 30 minutes most days of the week, going no more than 2 days in a row without exercise. 4. Check BP 1-2 x per week at home, keep log, and bring to clinic at next appointment. 5. Follow up 3 months.       Relevant Medications   Blood Pressure KIT   amLODipine (NORVASC) 10 MG tablet   losartan (COZAAR) 100 MG tablet   tadalafil (CIALIS) 5 MG tablet     Other   Elevated glucose    Elevated glucose with recent hospital admission.  A1C ordered in clinic for evaluation of prediabetes/diabetes.  Plan: 1. A1C ordered      Erectile dysfunction    Reported erectile dysfunction and having had tried over the counter supplements with moderate relief of symptoms.  Requesting to try  Cialis.  Plan: 1. Rx for cialis sent in to pharmacy on file 2. Follow up in 3 months for re-evaluation      Relevant Medications   tadalafil (CIALIS) 5 MG tablet    Other Visit Diagnoses    Increased glucose level       Relevant Orders  POCT HgB A1C (Completed)   Weight gain       Avascular necrosis of bone of right hip (HCC)       Relevant Medications   gabapentin (NEURONTIN) 100 MG capsule   Seasonal allergic rhinitis, unspecified trigger       Relevant Medications   fluticasone (FLONASE) 50 MCG/ACT nasal spray   Weight loss       Relevant Orders   Thyroid Panel With TSH      Meds ordered this encounter  Medications  . Blood Pressure KIT    Sig: Please allow patient to chose from machines that are covered under insurance    Dispense:  1 kit    Refill:  0  . amLODipine (NORVASC) 10 MG tablet    Sig: Take 1 tablet (10 mg total) by mouth daily.    Dispense:  90 tablet    Refill:  1  . losartan (COZAAR) 100 MG tablet    Sig: Take 1 tablet (100 mg total) by mouth daily.    Dispense:  90 tablet    Refill:  1  . gabapentin (NEURONTIN) 100 MG capsule    Sig: Take 1 capsule (100 mg total) by mouth at bedtime as needed (sleep).    Dispense:  90 capsule    Refill:  1  . fluticasone (FLONASE) 50 MCG/ACT nasal spray    Sig: Place 1 spray into both nostrils daily as needed for allergies or rhinitis.    Dispense:  16 mL    Refill:  1  . tadalafil (CIALIS) 5 MG tablet    Sig: Take 1 tablet (5 mg total) by mouth daily as needed for erectile dysfunction.    Dispense:  10 tablet    Refill:  0      Follow up plan: Return in about 3 months (around 04/11/2020) for HTN F/U.   Harlin Rain, [place] [facility] Nurse Practitioner [facility] 01/10/2020, 12:10 PM

## 2020-01-10 NOTE — Patient Instructions (Addendum)
Your medication refills have been sent to your pharmacy on file.  I have put in a prescription for Cialis 5mg  to take 1 tablet as needed for erectile dysfunction.  Have your labs completed today and we will contact you once we have the results.  Below are a list of local eye providers that you can contact to schedule a yearly eye exam.  Your provider would like to you have your annual eye exam. Please contact your current eye doctor or here are some good options for you to contact.   [facility]   Address: [address] [place], [place] [address] Phone: [phone]  Website: [email or web address] [address] [place], [place] [address] Phone: [phone] [email or web address]  [facility]  Address: [place], [place], [place] [address] Phone: [phone]   [facility] [address] [place], [place] [address] Phone: [phone]  [facility] Address: [place], [place], [place] [address]  Phone: [phone]  Continue your medications as prescribed.  We will plan to see you back in 3 months for hypertension follow up  You will receive a survey after today's visit either digitally by e-mail or paper by [place] mail. Your experiences and feedback matter to [place].  Please respond so we know how we are doing as we provide care for you.  Call us with any questions/concerns/needs.  It is my goal to be available to you for your health concerns.  Thanks for choosing me to be a partner in your healthcare needs!  Harlin Rain, FNP-C Family Nurse Practitioner [facility] Phone: [phone]

## 2020-01-10 NOTE — Assessment & Plan Note (Signed)
Uncontrolled hypertension.  BP is not at goal < 130/80.  Pt is not working on lifestyle modifications.  Taking medications tolerating well without side effects. Complications: Chronic pancreatitis, CKD stage 3, history of acute renal failure, tobacco use disorder, alcohol use disorder, chronic pain.  Plan: 1. Continue taking losartan 100mg  daily 2. Obtain labs today  3. Encouraged heart healthy diet and increasing exercise to 30 minutes most days of the week, going no more than 2 days in a row without exercise. 4. Check BP 1-2 x per week at home, keep log, and bring to clinic at next appointment. 5. Follow up 3 months.

## 2020-01-10 NOTE — Assessment & Plan Note (Signed)
Elevated glucose with recent hospital admission.  A1C ordered in clinic for evaluation of prediabetes/diabetes.  Plan: 1. A1C ordered

## 2020-01-10 NOTE — Assessment & Plan Note (Signed)
Reported erectile dysfunction and having had tried over the counter supplements with moderate relief of symptoms.  Requesting to try Cialis.  Plan: 1. Rx for cialis sent in to pharmacy on file 2. Follow up in 3 months for re-evaluation

## 2020-01-11 DIAGNOSIS — F4310 Post-traumatic stress disorder, unspecified: Secondary | ICD-10-CM | POA: Diagnosis not present

## 2020-01-11 LAB — THYROID PANEL WITH TSH
Free Thyroxine Index: 2.3 (ref 1.4–3.8)
T3 Uptake: 34 % (ref 22–35)
T4, Total: 6.8 ug/dL (ref 4.9–10.5)
TSH: 1.59 mIU/L (ref 0.40–4.50)

## 2020-01-12 DIAGNOSIS — F4310 Post-traumatic stress disorder, unspecified: Secondary | ICD-10-CM | POA: Diagnosis not present

## 2020-01-12 DIAGNOSIS — F3110 Bipolar disorder, current episode manic without psychotic features, unspecified: Secondary | ICD-10-CM | POA: Diagnosis not present

## 2020-01-13 DIAGNOSIS — F3110 Bipolar disorder, current episode manic without psychotic features, unspecified: Secondary | ICD-10-CM | POA: Diagnosis not present

## 2020-01-13 DIAGNOSIS — M069 Rheumatoid arthritis, unspecified: Secondary | ICD-10-CM | POA: Diagnosis not present

## 2020-01-14 DIAGNOSIS — M069 Rheumatoid arthritis, unspecified: Secondary | ICD-10-CM | POA: Diagnosis not present

## 2020-01-15 ENCOUNTER — Other Ambulatory Visit: Payer: Self-pay | Admitting: Nurse Practitioner

## 2020-01-15 DIAGNOSIS — M069 Rheumatoid arthritis, unspecified: Secondary | ICD-10-CM | POA: Diagnosis not present

## 2020-01-15 DIAGNOSIS — K219 Gastro-esophageal reflux disease without esophagitis: Secondary | ICD-10-CM

## 2020-01-15 NOTE — Progress Notes (Signed)
Thyroid labs within normal limits.  We will see him back for his next scheduled visit as planned.

## 2020-01-16 DIAGNOSIS — M069 Rheumatoid arthritis, unspecified: Secondary | ICD-10-CM | POA: Diagnosis not present

## 2020-01-17 DIAGNOSIS — M069 Rheumatoid arthritis, unspecified: Secondary | ICD-10-CM | POA: Diagnosis not present

## 2020-01-18 DIAGNOSIS — M069 Rheumatoid arthritis, unspecified: Secondary | ICD-10-CM | POA: Diagnosis not present

## 2020-01-19 DIAGNOSIS — M069 Rheumatoid arthritis, unspecified: Secondary | ICD-10-CM | POA: Diagnosis not present

## 2020-01-19 DIAGNOSIS — F3110 Bipolar disorder, current episode manic without psychotic features, unspecified: Secondary | ICD-10-CM | POA: Diagnosis not present

## 2020-01-20 DIAGNOSIS — F3110 Bipolar disorder, current episode manic without psychotic features, unspecified: Secondary | ICD-10-CM | POA: Diagnosis not present

## 2020-01-22 DIAGNOSIS — M069 Rheumatoid arthritis, unspecified: Secondary | ICD-10-CM | POA: Diagnosis not present

## 2020-01-23 DIAGNOSIS — M069 Rheumatoid arthritis, unspecified: Secondary | ICD-10-CM | POA: Diagnosis not present

## 2020-01-24 DIAGNOSIS — M069 Rheumatoid arthritis, unspecified: Secondary | ICD-10-CM | POA: Diagnosis not present

## 2020-01-25 DIAGNOSIS — M069 Rheumatoid arthritis, unspecified: Secondary | ICD-10-CM | POA: Diagnosis not present

## 2020-01-26 DIAGNOSIS — M069 Rheumatoid arthritis, unspecified: Secondary | ICD-10-CM | POA: Diagnosis not present

## 2020-01-26 DIAGNOSIS — F3110 Bipolar disorder, current episode manic without psychotic features, unspecified: Secondary | ICD-10-CM | POA: Diagnosis not present

## 2020-01-27 DIAGNOSIS — F3110 Bipolar disorder, current episode manic without psychotic features, unspecified: Secondary | ICD-10-CM | POA: Diagnosis not present

## 2020-01-29 DIAGNOSIS — M069 Rheumatoid arthritis, unspecified: Secondary | ICD-10-CM | POA: Diagnosis not present

## 2020-01-30 DIAGNOSIS — M069 Rheumatoid arthritis, unspecified: Secondary | ICD-10-CM | POA: Diagnosis not present

## 2020-01-31 DIAGNOSIS — M069 Rheumatoid arthritis, unspecified: Secondary | ICD-10-CM | POA: Diagnosis not present

## 2020-02-01 DIAGNOSIS — M069 Rheumatoid arthritis, unspecified: Secondary | ICD-10-CM | POA: Diagnosis not present

## 2020-02-02 DIAGNOSIS — F3110 Bipolar disorder, current episode manic without psychotic features, unspecified: Secondary | ICD-10-CM | POA: Diagnosis not present

## 2020-02-02 DIAGNOSIS — M069 Rheumatoid arthritis, unspecified: Secondary | ICD-10-CM | POA: Diagnosis not present

## 2020-02-03 DIAGNOSIS — F3110 Bipolar disorder, current episode manic without psychotic features, unspecified: Secondary | ICD-10-CM | POA: Diagnosis not present

## 2020-02-05 DIAGNOSIS — M069 Rheumatoid arthritis, unspecified: Secondary | ICD-10-CM | POA: Diagnosis not present

## 2020-02-06 DIAGNOSIS — M069 Rheumatoid arthritis, unspecified: Secondary | ICD-10-CM | POA: Diagnosis not present

## 2020-02-07 DIAGNOSIS — M069 Rheumatoid arthritis, unspecified: Secondary | ICD-10-CM | POA: Diagnosis not present

## 2020-02-08 DIAGNOSIS — M069 Rheumatoid arthritis, unspecified: Secondary | ICD-10-CM | POA: Diagnosis not present

## 2020-02-09 DIAGNOSIS — F3110 Bipolar disorder, current episode manic without psychotic features, unspecified: Secondary | ICD-10-CM | POA: Diagnosis not present

## 2020-02-09 DIAGNOSIS — M069 Rheumatoid arthritis, unspecified: Secondary | ICD-10-CM | POA: Diagnosis not present

## 2020-02-10 DIAGNOSIS — F3110 Bipolar disorder, current episode manic without psychotic features, unspecified: Secondary | ICD-10-CM | POA: Diagnosis not present

## 2020-02-12 DIAGNOSIS — F3110 Bipolar disorder, current episode manic without psychotic features, unspecified: Secondary | ICD-10-CM | POA: Diagnosis not present

## 2020-02-12 DIAGNOSIS — M069 Rheumatoid arthritis, unspecified: Secondary | ICD-10-CM | POA: Diagnosis not present

## 2020-02-13 DIAGNOSIS — M069 Rheumatoid arthritis, unspecified: Secondary | ICD-10-CM | POA: Diagnosis not present

## 2020-02-14 DIAGNOSIS — F3110 Bipolar disorder, current episode manic without psychotic features, unspecified: Secondary | ICD-10-CM | POA: Diagnosis not present

## 2020-02-14 DIAGNOSIS — M069 Rheumatoid arthritis, unspecified: Secondary | ICD-10-CM | POA: Diagnosis not present

## 2020-02-15 DIAGNOSIS — M069 Rheumatoid arthritis, unspecified: Secondary | ICD-10-CM | POA: Diagnosis not present

## 2020-02-15 DIAGNOSIS — F3110 Bipolar disorder, current episode manic without psychotic features, unspecified: Secondary | ICD-10-CM | POA: Diagnosis not present

## 2020-02-15 DIAGNOSIS — F4310 Post-traumatic stress disorder, unspecified: Secondary | ICD-10-CM | POA: Diagnosis not present

## 2020-02-15 DIAGNOSIS — M0600 Rheumatoid arthritis without rheumatoid factor, unspecified site: Secondary | ICD-10-CM | POA: Diagnosis not present

## 2020-02-16 DIAGNOSIS — M0600 Rheumatoid arthritis without rheumatoid factor, unspecified site: Secondary | ICD-10-CM | POA: Diagnosis not present

## 2020-02-16 DIAGNOSIS — F3110 Bipolar disorder, current episode manic without psychotic features, unspecified: Secondary | ICD-10-CM | POA: Diagnosis not present

## 2020-02-16 DIAGNOSIS — F4310 Post-traumatic stress disorder, unspecified: Secondary | ICD-10-CM | POA: Diagnosis not present

## 2020-02-19 DIAGNOSIS — F4310 Post-traumatic stress disorder, unspecified: Secondary | ICD-10-CM | POA: Diagnosis not present

## 2020-02-19 DIAGNOSIS — M0600 Rheumatoid arthritis without rheumatoid factor, unspecified site: Secondary | ICD-10-CM | POA: Diagnosis not present

## 2020-02-20 DIAGNOSIS — M0600 Rheumatoid arthritis without rheumatoid factor, unspecified site: Secondary | ICD-10-CM | POA: Diagnosis not present

## 2020-02-20 DIAGNOSIS — F4310 Post-traumatic stress disorder, unspecified: Secondary | ICD-10-CM | POA: Diagnosis not present

## 2020-02-21 DIAGNOSIS — F4310 Post-traumatic stress disorder, unspecified: Secondary | ICD-10-CM | POA: Diagnosis not present

## 2020-02-21 DIAGNOSIS — M0600 Rheumatoid arthritis without rheumatoid factor, unspecified site: Secondary | ICD-10-CM | POA: Diagnosis not present

## 2020-02-22 DIAGNOSIS — M0600 Rheumatoid arthritis without rheumatoid factor, unspecified site: Secondary | ICD-10-CM | POA: Diagnosis not present

## 2020-02-22 DIAGNOSIS — F4310 Post-traumatic stress disorder, unspecified: Secondary | ICD-10-CM | POA: Diagnosis not present

## 2020-02-23 DIAGNOSIS — M0600 Rheumatoid arthritis without rheumatoid factor, unspecified site: Secondary | ICD-10-CM | POA: Diagnosis not present

## 2020-02-23 DIAGNOSIS — F4310 Post-traumatic stress disorder, unspecified: Secondary | ICD-10-CM | POA: Diagnosis not present

## 2020-02-23 DIAGNOSIS — F3110 Bipolar disorder, current episode manic without psychotic features, unspecified: Secondary | ICD-10-CM | POA: Diagnosis not present

## 2020-02-24 DIAGNOSIS — F3110 Bipolar disorder, current episode manic without psychotic features, unspecified: Secondary | ICD-10-CM | POA: Diagnosis not present

## 2020-02-26 DIAGNOSIS — F4310 Post-traumatic stress disorder, unspecified: Secondary | ICD-10-CM | POA: Diagnosis not present

## 2020-02-26 DIAGNOSIS — M0600 Rheumatoid arthritis without rheumatoid factor, unspecified site: Secondary | ICD-10-CM | POA: Diagnosis not present

## 2020-02-27 DIAGNOSIS — M0600 Rheumatoid arthritis without rheumatoid factor, unspecified site: Secondary | ICD-10-CM | POA: Diagnosis not present

## 2020-02-27 DIAGNOSIS — F4310 Post-traumatic stress disorder, unspecified: Secondary | ICD-10-CM | POA: Diagnosis not present

## 2020-02-28 DIAGNOSIS — M0600 Rheumatoid arthritis without rheumatoid factor, unspecified site: Secondary | ICD-10-CM | POA: Diagnosis not present

## 2020-02-28 DIAGNOSIS — F4310 Post-traumatic stress disorder, unspecified: Secondary | ICD-10-CM | POA: Diagnosis not present

## 2020-03-01 DIAGNOSIS — F3110 Bipolar disorder, current episode manic without psychotic features, unspecified: Secondary | ICD-10-CM | POA: Diagnosis not present

## 2020-03-02 DIAGNOSIS — F3110 Bipolar disorder, current episode manic without psychotic features, unspecified: Secondary | ICD-10-CM | POA: Diagnosis not present

## 2020-03-04 DIAGNOSIS — F4310 Post-traumatic stress disorder, unspecified: Secondary | ICD-10-CM | POA: Diagnosis not present

## 2020-03-04 DIAGNOSIS — M0600 Rheumatoid arthritis without rheumatoid factor, unspecified site: Secondary | ICD-10-CM | POA: Diagnosis not present

## 2020-03-05 DIAGNOSIS — F4310 Post-traumatic stress disorder, unspecified: Secondary | ICD-10-CM | POA: Diagnosis not present

## 2020-03-05 DIAGNOSIS — M0600 Rheumatoid arthritis without rheumatoid factor, unspecified site: Secondary | ICD-10-CM | POA: Diagnosis not present

## 2020-03-06 ENCOUNTER — Ambulatory Visit (INDEPENDENT_AMBULATORY_CARE_PROVIDER_SITE_OTHER): Payer: Medicaid Other | Admitting: Family Medicine

## 2020-03-06 ENCOUNTER — Encounter: Payer: Self-pay | Admitting: Family Medicine

## 2020-03-06 ENCOUNTER — Other Ambulatory Visit: Payer: Self-pay | Admitting: Family Medicine

## 2020-03-06 ENCOUNTER — Other Ambulatory Visit: Payer: Self-pay

## 2020-03-06 ENCOUNTER — Ambulatory Visit: Payer: Self-pay

## 2020-03-06 VITALS — BP 140/78 | HR 81 | Temp 98.7°F | Ht 67.0 in | Wt 178.2 lb

## 2020-03-06 DIAGNOSIS — F3110 Bipolar disorder, current episode manic without psychotic features, unspecified: Secondary | ICD-10-CM | POA: Diagnosis not present

## 2020-03-06 DIAGNOSIS — N50812 Left testicular pain: Secondary | ICD-10-CM

## 2020-03-06 DIAGNOSIS — M0600 Rheumatoid arthritis without rheumatoid factor, unspecified site: Secondary | ICD-10-CM | POA: Diagnosis not present

## 2020-03-06 DIAGNOSIS — F4310 Post-traumatic stress disorder, unspecified: Secondary | ICD-10-CM | POA: Diagnosis not present

## 2020-03-06 NOTE — Patient Instructions (Signed)
As we discussed, I have ordered a STAT scrotum ultrasound.  We will contact you when we have this appointment scheduled.  If we do not have an appointment scheduled by 4:45pm today, we will contact you and have you proceed to the emergency room for evaluation.  We will plan to see you back in 4 weeks for hypertension follow up visit  You will receive a survey after today's visit either digitally by e-mail or paper by [place] mail. Your experiences and feedback matter to [place].  Please respond so we know how we are doing as we provide care for you.  Call us with any questions/concerns/needs.  It is my goal to be available to you for your health concerns.  Thanks for choosing me to be a partner in your healthcare needs!  Harlin Rain, FNP-C Family Nurse Practitioner [facility] Phone: [phone]

## 2020-03-06 NOTE — Telephone Encounter (Signed)
Patient called and says he woke up this morning with a swollen left testicle. He says it's not swollen real large, but noticeable in comparison to the right side. He says there is mild pain. He says he noticed blood in his urine about 2-3 days ago, but the urine is clear now. No other symptoms. Appointment scheduled for tomorrow at 17 with Cyndia Skeeters, [place], care advice given, he verbalized understanding.  Reason for Disposition . [1] Scrotum swelling AND [2] no pain  Answer Assessment - Initial Assessment Questions 1. SCROTAL SWELLING: "What does the scrotum look like?" "How swollen is it?" (mild, moderate severe; compare to other side)     Mild-moderate, it's noticeable 2. LOCATION: "Where is the swelling located?"     Left testicle 3. ONSET: "When did the swelling start?"     During the night I noticed it when I turned over 4. PATTERN: "Does it come and go, or has it been constant since it started?"     Constant 5. SCROTAL PAIN: "Is there any pain?" If Yes, ask: "How bad is it?"  (Scale 1-10; or mild, moderate, severe)     Mild 6. HERNIA: "Has a doctor ever told you that you have a hernia?"     No 7. OTHER SYMPTOMS: "Do you have any other symptoms?" (e.g., fever, abdominal pain, vomiting, difficulty passing urine)     No  Protocols used: SCROTUM SWELLING-A-AH

## 2020-03-06 NOTE — Progress Notes (Signed)
Subjective:    Patient ID: Noah Velasquez, male    DOB: [birth year], 54 y.o.   MRN: [id number]  Noah Velasquez is a 54 y.o. male presenting on 03/06/2020 for Testicle Pain (Patient woke up this morning with a swollen left testicle w/ persistent pain that worsen with movement or sitting. He says he noticed blood in his urine about 2-3 days ago, but the urine is clear now. No other symptoms)   HPI  [name] presents to clinic with c/o left testicular pain that he woke up with this morning that was swollen.  States pain is worsened with movement and sitting.  Reports blood in his urine 2-3 days ago, that his urine is currently clear.  Denies any dysuria, urinary urgency, frequency, hesitancy, feeling of incomplete emptying.  Denies any trauma or activity prior to the onset of left testicular pain and swelling.  Depression screen [facility] 2/9 01/10/2020 04/14/2019 10/20/2018  Decreased Interest 0 0 1  Down, Depressed, Hopeless 0 0 0  PHQ - 2 Score 0 0 1  Altered sleeping 1 0 2  Tired, decreased energy 1 1 1   Change in appetite 2 0 1  Feeling bad or failure about yourself  0 0 0  Trouble concentrating 0 0 2  Moving slowly or fidgety/restless 0 0 0  Suicidal thoughts 0 0 0  PHQ-9 Score 4 1 7   Difficult doing work/chores Not difficult at all Not difficult at all Not difficult at all  Some encounter information is confidential and restricted. Go to Review Flowsheets activity to see all data.    Social History   Tobacco Use  . Smoking status: Current Every Day Smoker    Packs/day: 0.25    Years: 15.00    Pack years: 3.75    Types: Cigarettes  . Smokeless tobacco: Never Used  Vaping Use  . Vaping Use: Never used  Substance Use Topics  . Alcohol use: Yes    Alcohol/week: 7.0 standard drinks    Types: 7 Cans of beer per week    Comment: weekly  . Drug use: Yes    Types: Marijuana    Review of Systems  Constitutional: Negative.   HENT: Negative.   Eyes: Negative.   Respiratory:  Negative.   Cardiovascular: Negative.   Gastrointestinal: Negative.   Endocrine: Negative.   Genitourinary: Positive for hematuria, scrotal swelling and testicular pain. Negative for decreased urine volume, difficulty urinating, discharge, dysuria, enuresis, flank pain, frequency, genital sores, penile pain, penile swelling and urgency.  Musculoskeletal: Negative.   Skin: Negative.   Allergic/Immunologic: Negative.   Neurological: Negative.   Hematological: Negative.   Psychiatric/Behavioral: Negative.    Per HPI unless specifically indicated above     Objective:    BP 140/78 (BP Location: Right Arm, Patient Position: Sitting, Cuff Size: Normal)   Pulse 81   Temp 98.7 F (37.1 C) (Oral)   Ht 5\' 7"  (1.702 m)   Wt 178 lb 3.2 oz (80.8 kg)   BMI 27.91 kg/m   Wt Readings from Last 3 Encounters:  03/06/20 178 lb 3.2 oz (80.8 kg)  12/09/19 164 lb 11.2 oz (74.7 kg)  10/31/19 184 lb (83.5 kg)    Physical Exam Vitals reviewed. Exam conducted with a chaperone present Bary Castilla, CMA).  Constitutional:      General: He is not in acute distress.    Appearance: Normal appearance. He is well-developed, well-groomed and overweight. He is not ill-appearing or toxic-appearing.  HENT:     Head: Normocephalic  and atraumatic.     Nose:     Comments: [name] is in place, covering mouth and nose. Eyes:     General:        Right eye: No discharge.        Left eye: No discharge.     Extraocular Movements: Extraocular movements intact.     Conjunctiva/sclera: Conjunctivae normal.     Pupils: Pupils are equal, round, and reactive to light.  Cardiovascular:     Rate and Rhythm: Normal rate and regular rhythm.     Pulses: Normal pulses.     Heart sounds: Normal heart sounds. No murmur heard.  No friction rub. No gallop.   Pulmonary:     Effort: Pulmonary effort is normal. No respiratory distress.     Breath sounds: Normal breath sounds.  Abdominal:     Hernia: There is no hernia in the left  inguinal area or right inguinal area.  Genitourinary:    Pubic Area: No rash or pubic lice.      Penis: Normal.      Testes:        Right: Mass, tenderness or swelling not present.        Left: Tenderness and swelling present.  Musculoskeletal:     Right lower leg: No edema.     Left lower leg: No edema.  Lymphadenopathy:     Lower Body: No right inguinal adenopathy. No left inguinal adenopathy.  Skin:    General: Skin is warm and dry.     Capillary Refill: Capillary refill takes less than 2 seconds.  Neurological:     General: No focal deficit present.     Mental Status: He is alert and oriented to person, place, and time.  Psychiatric:        Attention and Perception: Attention and perception normal.        Mood and Affect: Mood and affect normal.        Speech: Speech normal.        Behavior: Behavior normal. Behavior is cooperative.        Thought Content: Thought content normal.        Cognition and Memory: Cognition and memory normal.    Results for orders placed or performed in visit on 01/10/20  Thyroid Panel With TSH  Result Value Ref Range   T3 Uptake 34 22 - 35 %   T4, Total 6.8 4.9 - 10.5 mcg/dL   Free Thyroxine Index 2.3 1.4 - 3.8   TSH 1.59 0.40 - 4.50 mIU/L  POCT HgB A1C  Result Value Ref Range   Hemoglobin A1C 5.2 4.0 - 5.6 %   HbA1c POC (<> result, manual entry)     HbA1c, POC (prediabetic range)     HbA1c, POC (controlled diabetic range)        Assessment & Plan:   Problem List Items Addressed This Visit      Other   Left testicular pain - Primary    Acute left testicular pain with swelling x 1 day.  Reports awoke with testicular swelling and pain, had hematuria 2-3 days ago with resolution of symptoms.  Offered and declined STI testing.  Discussed needing STAT scrotum ultrasound given his concerns and palpable swelling/mass of the left testicle, would work to get this scheduled outpatient but if unable to, would need to proceed to ER for evaluation.   Patient in agreement with plan.  Plan: 1. Stat scrotum US ordered and contact made with referral coordinator  to get this scheduled      Relevant Orders   CBC with Differential   COMPLETE METABOLIC PANEL WITH GFR   POCT Urinalysis Dipstick   US Scrotum      No orders of the defined types were placed in this encounter.     Follow up plan: Return in about 4 weeks (around 04/03/2020) for HTN F/U.   Harlin Rain, [place] Family Nurse Practitioner [facility] 03/06/2020, 3:20 PM

## 2020-03-06 NOTE — Assessment & Plan Note (Signed)
Acute left testicular pain with swelling x 1 day.  Reports awoke with testicular swelling and pain, had hematuria 2-3 days ago with resolution of symptoms.  Offered and declined STI testing.  Discussed needing STAT scrotum ultrasound given his concerns and palpable swelling/mass of the left testicle, would work to get this scheduled outpatient but if unable to, would need to proceed to ER for evaluation.  Patient in agreement with plan.  Plan: 1. Stat scrotum US ordered and contact made with referral coordinator to get this scheduled

## 2020-03-07 ENCOUNTER — Emergency Department
Admission: EM | Admit: 2020-03-07 | Discharge: 2020-03-07 | Disposition: A | Discharge status: Home / Self Care | Payer: Medicaid Other | Attending: Emergency Medicine | Admitting: Emergency Medicine

## 2020-03-07 ENCOUNTER — Other Ambulatory Visit: Payer: Self-pay

## 2020-03-07 ENCOUNTER — Encounter: Payer: Self-pay | Admitting: Emergency Medicine

## 2020-03-07 ENCOUNTER — Ambulatory Visit: Payer: Self-pay | Admitting: Family Medicine

## 2020-03-07 ENCOUNTER — Ambulatory Visit: Admit: 2020-03-07 | Payer: Medicaid Other

## 2020-03-07 ENCOUNTER — Emergency Department: Payer: Medicaid Other

## 2020-03-07 DIAGNOSIS — M0600 Rheumatoid arthritis without rheumatoid factor, unspecified site: Secondary | ICD-10-CM | POA: Diagnosis not present

## 2020-03-07 DIAGNOSIS — N433 Hydrocele, unspecified: Secondary | ICD-10-CM | POA: Diagnosis not present

## 2020-03-07 DIAGNOSIS — N451 Epididymitis: Secondary | ICD-10-CM | POA: Diagnosis not present

## 2020-03-07 DIAGNOSIS — N454 Abscess of epididymis or testis: Secondary | ICD-10-CM | POA: Insufficient documentation

## 2020-03-07 DIAGNOSIS — N50812 Left testicular pain: Secondary | ICD-10-CM | POA: Diagnosis not present

## 2020-03-07 DIAGNOSIS — N183 Chronic kidney disease, stage 3 unspecified: Secondary | ICD-10-CM | POA: Diagnosis not present

## 2020-03-07 DIAGNOSIS — I129 Hypertensive chronic kidney disease with stage 1 through stage 4 chronic kidney disease, or unspecified chronic kidney disease: Secondary | ICD-10-CM | POA: Diagnosis not present

## 2020-03-07 DIAGNOSIS — Z79899 Other long term (current) drug therapy: Secondary | ICD-10-CM | POA: Insufficient documentation

## 2020-03-07 DIAGNOSIS — F4310 Post-traumatic stress disorder, unspecified: Secondary | ICD-10-CM | POA: Diagnosis not present

## 2020-03-07 DIAGNOSIS — F17210 Nicotine dependence, cigarettes, uncomplicated: Secondary | ICD-10-CM | POA: Insufficient documentation

## 2020-03-07 DIAGNOSIS — N50819 Testicular pain, unspecified: Secondary | ICD-10-CM

## 2020-03-07 DIAGNOSIS — N5089 Other specified disorders of the male genital organs: Secondary | ICD-10-CM | POA: Diagnosis not present

## 2020-03-07 MED ORDER — PROBIOTIC ACIDOPHILUS PO CAPS: 1.0000 | ORAL_CAPSULE | Freq: Every day | ORAL | 0 refills | Status: DC

## 2020-03-07 MED ORDER — NAPROXEN 500 MG PO TABS
500.0000 mg | ORAL_TABLET | Freq: Two times a day (BID) | ORAL | 0 refills | Status: DC | PRN
Start: 2020-03-07 — End: 2020-06-06

## 2020-03-07 MED ORDER — CIPROFLOXACIN HCL 500 MG PO TABS: 500.0000 mg | ORAL_TABLET | Freq: Two times a day (BID) | ORAL | 0 refills | Status: AC

## 2020-03-07 NOTE — ED Provider Notes (Signed)
[facility] Emergency Department Provider Note  ____________________________________________  Time seen: Approximately 12:01 PM  I have reviewed the triage vital signs and the nursing notes.   HISTORY  Chief Complaint Testicle Pain    HPI Noah Velasquez is a 54 y.o. male with a history of chronic hip pain and chronic pancreatitis, CKD, hypertension, PTSD who comes to the ED complaining of left testicular pain and swelling for the past 2 days, worse with movement, moderate intensity, nonradiating, no alleviating factors.   Denies dysuria frequency urgency.  No penile discharge.  Uses condoms always.     Past Medical History:  Diagnosis Date  . Chronic hip pain   . Chronic pancreatitis ([place])   . CKD (chronic kidney disease) stage 3, GFR 30-59 ml/min   . GERD (gastroesophageal reflux disease)   . Hyperlipidemia   . Hypertension   . Osteoarthritis   . Pancreatitis   . Pancreatitis   . PTSD (post-traumatic stress disorder)   . Viral infection of left eye      Patient Active Problem List   Diagnosis Date Noted  . Left testicular pain 03/06/2020  . Elevated glucose 01/10/2020  . Erectile dysfunction 01/10/2020  . Sepsis ([place]) 12/05/2019  . MDD (major depressive disorder), recurrent, in full remission ([place]) 06/05/2019  . Alcohol use disorder, moderate, in sustained remission ([place]) 04/06/2019  . Avascular necrosis of bone of left hip ([place]) 06/20/2018  . History of bad fall 06/20/2018  . Acute on chronic pancreatitis ([place]) 06/09/2018  . Acute on chronic respiratory failure ([place]) 05/06/2018  . Impacted cerumen of both ears 09/23/2017  . Acute renal failure (ARF) ([place]) 04/03/2017  . 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 Cervical lymphadenopathy 10/18/2016  . Mediastinal lymphadenopathy 10/18/2016  . Pancreatitis 04/29/2016  . Anemia 03/09/2016  . Unspecified exophthalmos 03/09/2016  . 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 Chronic pain due to injury  03/06/2015  . Depression 03/04/2015  . Essential hypertension 02/15/2015  . Controlled substance agreement terminated 11/23/2014  . Mood disorder ([place]) 10/05/2014  . Cigarette nicotine dependence without complication 74/16/3845  . Cannabis use disorder, mild, abuse 10/05/2014  . 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 Primary osteoarthritis of right hip 08/01/2012  . Tobacco use disorder 04/07/2010  . PTSD (post-traumatic stress disorder) 06/12/2009     Past Surgical History:  Procedure Laterality Date  . pancreatic stent placement       Prior to Admission medications   Medication Sig Start Date End Date Taking? Authorizing Provider  amLODipine (NORVASC) 10 MG tablet Take 1 tablet (10 mg total) by mouth daily. 01/10/20   Verl Bangs, FNP  Blood Pressure KIT Please allow patient to chose from machines that are covered under insurance 01/10/20   Malfi, Lupita Raider, FNP  buPROPion (WELLBUTRIN XL) 150 MG 24 hr tablet Take 1 tablet (150 mg total) by mouth daily. 04/14/19   Mikey College, NP  ciprofloxacin (CIPRO) 500 MG tablet Take 1 tablet (500 mg total) by mouth 2 (two) times daily for 14 days. 03/07/20 03/21/20  Carrie Mew, MD  famotidine (PEPCID) 20 MG tablet TAKE 1 TABLET BY MOUTH TWICE A DAY 01/16/20   Malfi, Lupita Raider, FNP  fluticasone (FLONASE) 50 MCG/ACT nasal spray Place 1 spray into both nostrils daily as needed for allergies or rhinitis. 01/10/20   Malfi, Lupita Raider, FNP  gabapentin (NEURONTIN) 100 MG capsule Take 1 capsule (100 mg total) by  mouth at bedtime as needed (sleep). Patient not taking: Reported on 03/06/2020 01/10/20   Verl Bangs, FNP  Lactobacillus (PROBIOTIC ACIDOPHILUS) CAPS Take 1 capsule by mouth daily. 03/07/20   Carrie Mew, MD  loratadine (CLARITIN) 10 MG tablet Take 1 tablet (10 mg total) by mouth daily. 10/11/18   Mikey College, NP  losartan (COZAAR) 100 MG tablet Take 1 tablet (100  mg total) by mouth daily. 01/10/20   Malfi, Lupita Raider, FNP  naproxen (NAPROSYN) 500 MG tablet Take 1 tablet (500 mg total) by mouth 2 (two) times daily as needed for mild pain or moderate pain. 03/07/20   Carrie Mew, MD  nicotine (NICODERM CQ - DOSED IN MG/24 HOURS) 14 mg/24hr patch Place 1 patch (14 mg total) onto the skin daily. 04/08/19   Stark Jock Jude, MD  sertraline (ZOLOFT) 100 MG tablet TAKE 1 TABLET BY MOUTH EVERY DAY 11/07/19   Malfi, Lupita Raider, FNP  tadalafil (CIALIS) 5 MG tablet Take 1 tablet (5 mg total) by mouth daily as needed for erectile dysfunction. Patient not taking: Reported on 03/06/2020 01/10/20   Verl Bangs, FNP  traZODone (DESYREL) 150 MG tablet Take 1 tablet (150 mg total) by mouth at bedtime as needed for sleep. 10/20/18   Mikey College, NP  ZENPEP [phone] units CPEP TAKE 1 CAPSULE BY MOUTH 3 (THREE) TIMES DAILY WITH MEALS. 04/10/19   Mikey College, NP     Allergies Hctz [hydrochlorothiazide]   Family History  Problem Relation Age of Onset  . Diabetes Mother   . Sleep apnea Mother   . Emphysema Sister   . CAD Neg Hx   . Mental illness Neg Hx     Social History Social History   Tobacco Use  . Smoking status: Current Every Day Smoker    Packs/day: 0.25    Years: 15.00    Pack years: 3.75    Types: Cigarettes  . Smokeless tobacco: Never Used  Vaping Use  . Vaping Use: Never used  Substance Use Topics  . Alcohol use: Yes    Alcohol/week: 7.0 standard drinks    Types: 7 Cans of beer per week    Comment: weekly  . Drug use: Yes    Types: Marijuana    Review of Systems  Constitutional:   No fever or chills.  ENT:   No sore throat. No rhinorrhea. Cardiovascular:   No chest pain or syncope. Respiratory:   No dyspnea or cough. Gastrointestinal:   Negative for abdominal pain, vomiting and diarrhea.  Musculoskeletal:   Negative for focal pain or swelling All other systems reviewed and are negative except as documented above in ROS  and HPI.  ____________________________________________   PHYSICAL EXAM:  VITAL SIGNS: ED Triage Vitals  Enc Vitals Group     BP 03/07/20 0957 (!) 154/103     Pulse Rate 03/07/20 0957 82     Resp 03/07/20 0957 16     Temp 03/07/20 0957 99 F (37.2 C)     Temp Source 03/07/20 0957 Oral     SpO2 03/07/20 0957 97 %     Weight 03/07/20 1001 174 lb (78.9 kg)     Height 03/07/20 1001 '5\' 7"'  (1.702 m)     Head Circumference --      Peak Flow --      Pain Score 03/07/20 1000 10     Pain Loc --      Pain Edu? --      Excl. in  GC? --     Vital signs reviewed, nursing assessments reviewed.   Constitutional:   Alert and oriented. Non-toxic appearance. Eyes:   Conjunctivae are normal. EOMI. ENT      Head:   Normocephalic and atraumatic.       Neck:   No meningismus. Full ROM. Hematological/Lymphatic/Immunilogical:   No inguinal lymphadenopathy. Gastrointestinal:   Soft and nontender. Non distended. There is no CVA tenderness.  No rebound, rigidity, or guarding. Genitourinary: Swelling of the left hemiscrotum, with tenderness of the posterior testicle at the epididymis.  No mass.  No horizontal lie.  No penile discharge or genital lesions. Musculoskeletal:   Normal range of motion in all extremities.  No edema. Neurologic:   Normal speech and language.  Motor grossly intact. No acute focal neurologic deficits are appreciated.  Skin:    Skin is warm, dry and intact. No rash noted.  No wounds.  ____________________________________________    LABS (pertinent positives/negatives) (all labs ordered are listed, but only abnormal results are displayed) Labs Reviewed - No data to display ____________________________________________   EKG  ____________________________________________    RADIOLOGY  US SCROTUM W/DOPPLER  Result Date: 03/07/2020 CLINICAL DATA:  Left testicular pain for 2 days EXAM: SCROTAL ULTRASOUND DOPPLER ULTRASOUND OF THE TESTICLES TECHNIQUE: Complete ultrasound  examination of the testicles, epididymis, and other scrotal structures was performed. Color and spectral Doppler ultrasound were also utilized to evaluate blood flow to the testicles. COMPARISON:  None. FINDINGS: Right testicle Measurements: 5.0 x 2.7 x 2.1 cm. No mass or microlithiasis visualized. Left testicle Measurements: 3.9 x 2.6 x 3.3 cm. No mass or microlithiasis visualized. Right epididymis:  Normal in size and appearance. Left epididymis: The left epididymal body and tail is enlarged and heterogeneous with increased vascularity on color Doppler. No intra-epididymal fluid collection. Incidental note of a probable subcentimeter left epididymal appendix. Hydrocele:  Moderate left and small right hydroceles. Varicocele:  None visualized. Pulsed Doppler interrogation of both testes demonstrates normal low resistance arterial and venous waveforms bilaterally. IMPRESSION: 1. Negative for testicular torsion or intratesticular mass. 2. Findings of acute left-sided epididymitis. 3. Moderate left and small right hydroceles, likely reactive. Electronically Signed   By: Davina Poke D.O.   On: 03/07/2020 11:25    ____________________________________________   PROCEDURES Procedures  ____________________________________________  CLINICAL IMPRESSION / ASSESSMENT AND PLAN / ED COURSE  Pertinent labs & imaging results that were available during my care of the patient were reviewed by me and considered in my medical decision making (see chart for details).  Noah Velasquez was evaluated in Emergency Department on 03/07/2020 for the symptoms described in the history of present illness. He was evaluated in the context of the global COVID-19 pandemic, which necessitated consideration that the patient might be at risk for infection with the SARS-CoV-2 virus that causes COVID-19. Institutional protocols and algorithms that pertain to the evaluation of patients at risk for COVID-19 are in a state of rapid change  based on information released by regulatory bodies including the CDC and federal and state organizations. These policies and algorithms were followed during the patient's care in the ED.   Patient presents with left testicular pain and swelling, exam and ultrasound consistent with epididymitis.  Denies any risk for STI.  He reports anaphylactic reaction to hydrochlorothiazide in the past and although this is unlikely to cross-react with Bactrim, I will avoid the sulfa moiety since alternate agents are available.  I will prescribe Cipro for 2 weeks, naproxen as needed, probiotic.  ____________________________________________   FINAL CLINICAL IMPRESSION(S) / ED DIAGNOSES    Final diagnoses:  Left epididymitis     ED Discharge Orders         Ordered    ciprofloxacin (CIPRO) 500 MG tablet  2 times daily     Discontinue  Reprint     03/07/20 1201    naproxen (NAPROSYN) 500 MG tablet  2 times daily PRN     Discontinue  Reprint     03/07/20 1201    Lactobacillus (PROBIOTIC ACIDOPHILUS) CAPS  Daily     Discontinue  Reprint     03/07/20 1201          Portions of this note were generated with dragon dictation software. Dictation errors may occur despite best attempts at proofreading.   Carrie Mew, MD 03/07/20 1204

## 2020-03-07 NOTE — ED Notes (Signed)
ED Provider [place] at bedside. 

## 2020-03-07 NOTE — ED Triage Notes (Signed)
First Nurse Note:  C/O left testicular pain and swelling x 2 days.

## 2020-03-07 NOTE — ED Triage Notes (Signed)
Patient presents to the ED with painful left testicular swelling x 2 days.  Patient states, "It feels like there is something hard up in it."  Patient also reports tenderness to testicle.

## 2020-03-08 ENCOUNTER — Telehealth: Payer: Self-pay | Admitting: *Deleted

## 2020-03-08 DIAGNOSIS — M0600 Rheumatoid arthritis without rheumatoid factor, unspecified site: Secondary | ICD-10-CM | POA: Diagnosis not present

## 2020-03-08 DIAGNOSIS — F3110 Bipolar disorder, current episode manic without psychotic features, unspecified: Secondary | ICD-10-CM | POA: Diagnosis not present

## 2020-03-08 DIAGNOSIS — F4310 Post-traumatic stress disorder, unspecified: Secondary | ICD-10-CM | POA: Diagnosis not present

## 2020-03-08 NOTE — Telephone Encounter (Signed)
2nd attempt to contact pt to complete transition of care assessment; left message on voicemail. Katrice Ludene Stokke, RN, BSN, CCRN  Patient Engagement Center [phone]   

## 2020-03-08 NOTE — Telephone Encounter (Signed)
Attempted to contact pt to complete transition of care assessment; left message on voicemail. 

## 2020-03-09 DIAGNOSIS — F3110 Bipolar disorder, current episode manic without psychotic features, unspecified: Secondary | ICD-10-CM | POA: Diagnosis not present

## 2020-03-11 DIAGNOSIS — F4310 Post-traumatic stress disorder, unspecified: Secondary | ICD-10-CM | POA: Diagnosis not present

## 2020-03-11 DIAGNOSIS — M0600 Rheumatoid arthritis without rheumatoid factor, unspecified site: Secondary | ICD-10-CM | POA: Diagnosis not present

## 2020-03-12 DIAGNOSIS — F4310 Post-traumatic stress disorder, unspecified: Secondary | ICD-10-CM | POA: Diagnosis not present

## 2020-03-12 DIAGNOSIS — M0600 Rheumatoid arthritis without rheumatoid factor, unspecified site: Secondary | ICD-10-CM | POA: Diagnosis not present

## 2020-03-13 DIAGNOSIS — F3110 Bipolar disorder, current episode manic without psychotic features, unspecified: Secondary | ICD-10-CM | POA: Diagnosis not present

## 2020-03-13 DIAGNOSIS — F4310 Post-traumatic stress disorder, unspecified: Secondary | ICD-10-CM | POA: Diagnosis not present

## 2020-03-13 DIAGNOSIS — M0600 Rheumatoid arthritis without rheumatoid factor, unspecified site: Secondary | ICD-10-CM | POA: Diagnosis not present

## 2020-03-14 DIAGNOSIS — F4310 Post-traumatic stress disorder, unspecified: Secondary | ICD-10-CM | POA: Diagnosis not present

## 2020-03-14 DIAGNOSIS — M0600 Rheumatoid arthritis without rheumatoid factor, unspecified site: Secondary | ICD-10-CM | POA: Diagnosis not present

## 2020-03-15 DIAGNOSIS — M0600 Rheumatoid arthritis without rheumatoid factor, unspecified site: Secondary | ICD-10-CM | POA: Diagnosis not present

## 2020-03-15 DIAGNOSIS — F3110 Bipolar disorder, current episode manic without psychotic features, unspecified: Secondary | ICD-10-CM | POA: Diagnosis not present

## 2020-03-15 DIAGNOSIS — F4310 Post-traumatic stress disorder, unspecified: Secondary | ICD-10-CM | POA: Diagnosis not present

## 2020-03-16 DIAGNOSIS — F3110 Bipolar disorder, current episode manic without psychotic features, unspecified: Secondary | ICD-10-CM | POA: Diagnosis not present

## 2020-03-18 DIAGNOSIS — M0600 Rheumatoid arthritis without rheumatoid factor, unspecified site: Secondary | ICD-10-CM | POA: Diagnosis not present

## 2020-03-18 DIAGNOSIS — F4310 Post-traumatic stress disorder, unspecified: Secondary | ICD-10-CM | POA: Diagnosis not present

## 2020-03-19 DIAGNOSIS — F4310 Post-traumatic stress disorder, unspecified: Secondary | ICD-10-CM | POA: Diagnosis not present

## 2020-03-19 DIAGNOSIS — M0600 Rheumatoid arthritis without rheumatoid factor, unspecified site: Secondary | ICD-10-CM | POA: Diagnosis not present

## 2020-03-20 DIAGNOSIS — F4310 Post-traumatic stress disorder, unspecified: Secondary | ICD-10-CM | POA: Diagnosis not present

## 2020-03-20 DIAGNOSIS — M0600 Rheumatoid arthritis without rheumatoid factor, unspecified site: Secondary | ICD-10-CM | POA: Diagnosis not present

## 2020-03-21 DIAGNOSIS — F4310 Post-traumatic stress disorder, unspecified: Secondary | ICD-10-CM | POA: Diagnosis not present

## 2020-03-21 DIAGNOSIS — M0600 Rheumatoid arthritis without rheumatoid factor, unspecified site: Secondary | ICD-10-CM | POA: Diagnosis not present

## 2020-03-22 DIAGNOSIS — M0600 Rheumatoid arthritis without rheumatoid factor, unspecified site: Secondary | ICD-10-CM | POA: Diagnosis not present

## 2020-03-22 DIAGNOSIS — F3110 Bipolar disorder, current episode manic without psychotic features, unspecified: Secondary | ICD-10-CM | POA: Diagnosis not present

## 2020-03-22 DIAGNOSIS — F4310 Post-traumatic stress disorder, unspecified: Secondary | ICD-10-CM | POA: Diagnosis not present

## 2020-03-23 DIAGNOSIS — F3110 Bipolar disorder, current episode manic without psychotic features, unspecified: Secondary | ICD-10-CM | POA: Diagnosis not present

## 2020-03-25 DIAGNOSIS — M0600 Rheumatoid arthritis without rheumatoid factor, unspecified site: Secondary | ICD-10-CM | POA: Diagnosis not present

## 2020-03-25 DIAGNOSIS — F4310 Post-traumatic stress disorder, unspecified: Secondary | ICD-10-CM | POA: Diagnosis not present

## 2020-03-26 DIAGNOSIS — M0600 Rheumatoid arthritis without rheumatoid factor, unspecified site: Secondary | ICD-10-CM | POA: Diagnosis not present

## 2020-03-26 DIAGNOSIS — F4310 Post-traumatic stress disorder, unspecified: Secondary | ICD-10-CM | POA: Diagnosis not present

## 2020-03-27 DIAGNOSIS — F4310 Post-traumatic stress disorder, unspecified: Secondary | ICD-10-CM | POA: Diagnosis not present

## 2020-03-27 DIAGNOSIS — M0600 Rheumatoid arthritis without rheumatoid factor, unspecified site: Secondary | ICD-10-CM | POA: Diagnosis not present

## 2020-03-28 DIAGNOSIS — M0600 Rheumatoid arthritis without rheumatoid factor, unspecified site: Secondary | ICD-10-CM | POA: Diagnosis not present

## 2020-03-28 DIAGNOSIS — F4310 Post-traumatic stress disorder, unspecified: Secondary | ICD-10-CM | POA: Diagnosis not present

## 2020-03-29 DIAGNOSIS — F4310 Post-traumatic stress disorder, unspecified: Secondary | ICD-10-CM | POA: Diagnosis not present

## 2020-03-29 DIAGNOSIS — F3110 Bipolar disorder, current episode manic without psychotic features, unspecified: Secondary | ICD-10-CM | POA: Diagnosis not present

## 2020-03-29 DIAGNOSIS — M0600 Rheumatoid arthritis without rheumatoid factor, unspecified site: Secondary | ICD-10-CM | POA: Diagnosis not present

## 2020-03-30 DIAGNOSIS — F3110 Bipolar disorder, current episode manic without psychotic features, unspecified: Secondary | ICD-10-CM | POA: Diagnosis not present

## 2020-04-01 DIAGNOSIS — F4310 Post-traumatic stress disorder, unspecified: Secondary | ICD-10-CM | POA: Diagnosis not present

## 2020-04-01 DIAGNOSIS — M0600 Rheumatoid arthritis without rheumatoid factor, unspecified site: Secondary | ICD-10-CM | POA: Diagnosis not present

## 2020-04-02 DIAGNOSIS — M0600 Rheumatoid arthritis without rheumatoid factor, unspecified site: Secondary | ICD-10-CM | POA: Diagnosis not present

## 2020-04-02 DIAGNOSIS — F4310 Post-traumatic stress disorder, unspecified: Secondary | ICD-10-CM | POA: Diagnosis not present

## 2020-04-03 DIAGNOSIS — M0600 Rheumatoid arthritis without rheumatoid factor, unspecified site: Secondary | ICD-10-CM | POA: Diagnosis not present

## 2020-04-03 DIAGNOSIS — F4310 Post-traumatic stress disorder, unspecified: Secondary | ICD-10-CM | POA: Diagnosis not present

## 2020-04-04 DIAGNOSIS — F4310 Post-traumatic stress disorder, unspecified: Secondary | ICD-10-CM | POA: Diagnosis not present

## 2020-04-04 DIAGNOSIS — M0600 Rheumatoid arthritis without rheumatoid factor, unspecified site: Secondary | ICD-10-CM | POA: Diagnosis not present

## 2020-04-05 DIAGNOSIS — F4310 Post-traumatic stress disorder, unspecified: Secondary | ICD-10-CM | POA: Diagnosis not present

## 2020-04-05 DIAGNOSIS — M0600 Rheumatoid arthritis without rheumatoid factor, unspecified site: Secondary | ICD-10-CM | POA: Diagnosis not present

## 2020-04-05 DIAGNOSIS — F3110 Bipolar disorder, current episode manic without psychotic features, unspecified: Secondary | ICD-10-CM | POA: Diagnosis not present

## 2020-04-06 DIAGNOSIS — F3110 Bipolar disorder, current episode manic without psychotic features, unspecified: Secondary | ICD-10-CM | POA: Diagnosis not present

## 2020-04-08 DIAGNOSIS — M0600 Rheumatoid arthritis without rheumatoid factor, unspecified site: Secondary | ICD-10-CM | POA: Diagnosis not present

## 2020-04-08 DIAGNOSIS — F4310 Post-traumatic stress disorder, unspecified: Secondary | ICD-10-CM | POA: Diagnosis not present

## 2020-04-09 DIAGNOSIS — M0600 Rheumatoid arthritis without rheumatoid factor, unspecified site: Secondary | ICD-10-CM | POA: Diagnosis not present

## 2020-04-09 DIAGNOSIS — F4310 Post-traumatic stress disorder, unspecified: Secondary | ICD-10-CM | POA: Diagnosis not present

## 2020-04-10 DIAGNOSIS — M0600 Rheumatoid arthritis without rheumatoid factor, unspecified site: Secondary | ICD-10-CM | POA: Diagnosis not present

## 2020-04-10 DIAGNOSIS — F4310 Post-traumatic stress disorder, unspecified: Secondary | ICD-10-CM | POA: Diagnosis not present

## 2020-04-11 DIAGNOSIS — F4310 Post-traumatic stress disorder, unspecified: Secondary | ICD-10-CM | POA: Diagnosis not present

## 2020-04-11 DIAGNOSIS — M0600 Rheumatoid arthritis without rheumatoid factor, unspecified site: Secondary | ICD-10-CM | POA: Diagnosis not present

## 2020-04-12 DIAGNOSIS — M0600 Rheumatoid arthritis without rheumatoid factor, unspecified site: Secondary | ICD-10-CM | POA: Diagnosis not present

## 2020-04-12 DIAGNOSIS — F3110 Bipolar disorder, current episode manic without psychotic features, unspecified: Secondary | ICD-10-CM | POA: Diagnosis not present

## 2020-04-12 DIAGNOSIS — F4310 Post-traumatic stress disorder, unspecified: Secondary | ICD-10-CM | POA: Diagnosis not present

## 2020-04-13 DIAGNOSIS — F3110 Bipolar disorder, current episode manic without psychotic features, unspecified: Secondary | ICD-10-CM | POA: Diagnosis not present

## 2020-04-15 DIAGNOSIS — F4310 Post-traumatic stress disorder, unspecified: Secondary | ICD-10-CM | POA: Diagnosis not present

## 2020-04-15 DIAGNOSIS — M0600 Rheumatoid arthritis without rheumatoid factor, unspecified site: Secondary | ICD-10-CM | POA: Diagnosis not present

## 2020-04-16 DIAGNOSIS — F4310 Post-traumatic stress disorder, unspecified: Secondary | ICD-10-CM | POA: Diagnosis not present

## 2020-04-16 DIAGNOSIS — M0600 Rheumatoid arthritis without rheumatoid factor, unspecified site: Secondary | ICD-10-CM | POA: Diagnosis not present

## 2020-04-17 DIAGNOSIS — F4310 Post-traumatic stress disorder, unspecified: Secondary | ICD-10-CM | POA: Diagnosis not present

## 2020-04-17 DIAGNOSIS — M0600 Rheumatoid arthritis without rheumatoid factor, unspecified site: Secondary | ICD-10-CM | POA: Diagnosis not present

## 2020-04-18 DIAGNOSIS — M0600 Rheumatoid arthritis without rheumatoid factor, unspecified site: Secondary | ICD-10-CM | POA: Diagnosis not present

## 2020-04-18 DIAGNOSIS — F4310 Post-traumatic stress disorder, unspecified: Secondary | ICD-10-CM | POA: Diagnosis not present

## 2020-04-19 DIAGNOSIS — F3110 Bipolar disorder, current episode manic without psychotic features, unspecified: Secondary | ICD-10-CM | POA: Diagnosis not present

## 2020-04-19 DIAGNOSIS — M0600 Rheumatoid arthritis without rheumatoid factor, unspecified site: Secondary | ICD-10-CM | POA: Diagnosis not present

## 2020-04-19 DIAGNOSIS — F4310 Post-traumatic stress disorder, unspecified: Secondary | ICD-10-CM | POA: Diagnosis not present

## 2020-04-20 DIAGNOSIS — F3110 Bipolar disorder, current episode manic without psychotic features, unspecified: Secondary | ICD-10-CM | POA: Diagnosis not present

## 2020-04-22 DIAGNOSIS — F4310 Post-traumatic stress disorder, unspecified: Secondary | ICD-10-CM | POA: Diagnosis not present

## 2020-04-22 DIAGNOSIS — M0600 Rheumatoid arthritis without rheumatoid factor, unspecified site: Secondary | ICD-10-CM | POA: Diagnosis not present

## 2020-04-23 DIAGNOSIS — M0600 Rheumatoid arthritis without rheumatoid factor, unspecified site: Secondary | ICD-10-CM | POA: Diagnosis not present

## 2020-04-23 DIAGNOSIS — F4310 Post-traumatic stress disorder, unspecified: Secondary | ICD-10-CM | POA: Diagnosis not present

## 2020-04-24 DIAGNOSIS — M0600 Rheumatoid arthritis without rheumatoid factor, unspecified site: Secondary | ICD-10-CM | POA: Diagnosis not present

## 2020-04-24 DIAGNOSIS — F4310 Post-traumatic stress disorder, unspecified: Secondary | ICD-10-CM | POA: Diagnosis not present

## 2020-04-25 DIAGNOSIS — F4310 Post-traumatic stress disorder, unspecified: Secondary | ICD-10-CM | POA: Diagnosis not present

## 2020-04-25 DIAGNOSIS — M0600 Rheumatoid arthritis without rheumatoid factor, unspecified site: Secondary | ICD-10-CM | POA: Diagnosis not present

## 2020-04-26 DIAGNOSIS — F4310 Post-traumatic stress disorder, unspecified: Secondary | ICD-10-CM | POA: Diagnosis not present

## 2020-04-26 DIAGNOSIS — F3110 Bipolar disorder, current episode manic without psychotic features, unspecified: Secondary | ICD-10-CM | POA: Diagnosis not present

## 2020-04-26 DIAGNOSIS — M0600 Rheumatoid arthritis without rheumatoid factor, unspecified site: Secondary | ICD-10-CM | POA: Diagnosis not present

## 2020-04-27 DIAGNOSIS — F3110 Bipolar disorder, current episode manic without psychotic features, unspecified: Secondary | ICD-10-CM | POA: Diagnosis not present

## 2020-04-29 DIAGNOSIS — F3110 Bipolar disorder, current episode manic without psychotic features, unspecified: Secondary | ICD-10-CM | POA: Diagnosis not present

## 2020-04-29 DIAGNOSIS — F4310 Post-traumatic stress disorder, unspecified: Secondary | ICD-10-CM | POA: Diagnosis not present

## 2020-04-29 DIAGNOSIS — M0600 Rheumatoid arthritis without rheumatoid factor, unspecified site: Secondary | ICD-10-CM | POA: Diagnosis not present

## 2020-04-30 DIAGNOSIS — M0600 Rheumatoid arthritis without rheumatoid factor, unspecified site: Secondary | ICD-10-CM | POA: Diagnosis not present

## 2020-04-30 DIAGNOSIS — F4310 Post-traumatic stress disorder, unspecified: Secondary | ICD-10-CM | POA: Diagnosis not present

## 2020-05-01 DIAGNOSIS — M0600 Rheumatoid arthritis without rheumatoid factor, unspecified site: Secondary | ICD-10-CM | POA: Diagnosis not present

## 2020-05-01 DIAGNOSIS — F4310 Post-traumatic stress disorder, unspecified: Secondary | ICD-10-CM | POA: Diagnosis not present

## 2020-05-01 DIAGNOSIS — F3110 Bipolar disorder, current episode manic without psychotic features, unspecified: Secondary | ICD-10-CM | POA: Diagnosis not present

## 2020-05-02 DIAGNOSIS — M0600 Rheumatoid arthritis without rheumatoid factor, unspecified site: Secondary | ICD-10-CM | POA: Diagnosis not present

## 2020-05-02 DIAGNOSIS — F4310 Post-traumatic stress disorder, unspecified: Secondary | ICD-10-CM | POA: Diagnosis not present

## 2020-05-03 DIAGNOSIS — F4310 Post-traumatic stress disorder, unspecified: Secondary | ICD-10-CM | POA: Diagnosis not present

## 2020-05-03 DIAGNOSIS — M0600 Rheumatoid arthritis without rheumatoid factor, unspecified site: Secondary | ICD-10-CM | POA: Diagnosis not present

## 2020-05-03 DIAGNOSIS — F3110 Bipolar disorder, current episode manic without psychotic features, unspecified: Secondary | ICD-10-CM | POA: Diagnosis not present

## 2020-05-04 DIAGNOSIS — F3110 Bipolar disorder, current episode manic without psychotic features, unspecified: Secondary | ICD-10-CM | POA: Diagnosis not present

## 2020-05-06 DIAGNOSIS — F4310 Post-traumatic stress disorder, unspecified: Secondary | ICD-10-CM | POA: Diagnosis not present

## 2020-05-06 DIAGNOSIS — M0600 Rheumatoid arthritis without rheumatoid factor, unspecified site: Secondary | ICD-10-CM | POA: Diagnosis not present

## 2020-05-06 DIAGNOSIS — F3110 Bipolar disorder, current episode manic without psychotic features, unspecified: Secondary | ICD-10-CM | POA: Diagnosis not present

## 2020-05-07 DIAGNOSIS — M0600 Rheumatoid arthritis without rheumatoid factor, unspecified site: Secondary | ICD-10-CM | POA: Diagnosis not present

## 2020-05-07 DIAGNOSIS — F4310 Post-traumatic stress disorder, unspecified: Secondary | ICD-10-CM | POA: Diagnosis not present

## 2020-05-07 DIAGNOSIS — F3110 Bipolar disorder, current episode manic without psychotic features, unspecified: Secondary | ICD-10-CM | POA: Diagnosis not present

## 2020-05-08 DIAGNOSIS — M0600 Rheumatoid arthritis without rheumatoid factor, unspecified site: Secondary | ICD-10-CM | POA: Diagnosis not present

## 2020-05-08 DIAGNOSIS — F4310 Post-traumatic stress disorder, unspecified: Secondary | ICD-10-CM | POA: Diagnosis not present

## 2020-05-09 DIAGNOSIS — M0600 Rheumatoid arthritis without rheumatoid factor, unspecified site: Secondary | ICD-10-CM | POA: Diagnosis not present

## 2020-05-09 DIAGNOSIS — F4310 Post-traumatic stress disorder, unspecified: Secondary | ICD-10-CM | POA: Diagnosis not present

## 2020-05-10 DIAGNOSIS — M0600 Rheumatoid arthritis without rheumatoid factor, unspecified site: Secondary | ICD-10-CM | POA: Diagnosis not present

## 2020-05-10 DIAGNOSIS — F4310 Post-traumatic stress disorder, unspecified: Secondary | ICD-10-CM | POA: Diagnosis not present

## 2020-05-13 DIAGNOSIS — F4310 Post-traumatic stress disorder, unspecified: Secondary | ICD-10-CM | POA: Diagnosis not present

## 2020-05-13 DIAGNOSIS — M0600 Rheumatoid arthritis without rheumatoid factor, unspecified site: Secondary | ICD-10-CM | POA: Diagnosis not present

## 2020-05-14 DIAGNOSIS — F3110 Bipolar disorder, current episode manic without psychotic features, unspecified: Secondary | ICD-10-CM | POA: Diagnosis not present

## 2020-05-14 DIAGNOSIS — M0600 Rheumatoid arthritis without rheumatoid factor, unspecified site: Secondary | ICD-10-CM | POA: Diagnosis not present

## 2020-05-14 DIAGNOSIS — F4310 Post-traumatic stress disorder, unspecified: Secondary | ICD-10-CM | POA: Diagnosis not present

## 2020-05-15 DIAGNOSIS — F3110 Bipolar disorder, current episode manic without psychotic features, unspecified: Secondary | ICD-10-CM | POA: Diagnosis not present

## 2020-05-15 DIAGNOSIS — M0600 Rheumatoid arthritis without rheumatoid factor, unspecified site: Secondary | ICD-10-CM | POA: Diagnosis not present

## 2020-05-15 DIAGNOSIS — F4310 Post-traumatic stress disorder, unspecified: Secondary | ICD-10-CM | POA: Diagnosis not present

## 2020-05-16 DIAGNOSIS — M0600 Rheumatoid arthritis without rheumatoid factor, unspecified site: Secondary | ICD-10-CM | POA: Diagnosis not present

## 2020-05-16 DIAGNOSIS — F4310 Post-traumatic stress disorder, unspecified: Secondary | ICD-10-CM | POA: Diagnosis not present

## 2020-05-17 DIAGNOSIS — F4310 Post-traumatic stress disorder, unspecified: Secondary | ICD-10-CM | POA: Diagnosis not present

## 2020-05-17 DIAGNOSIS — M0600 Rheumatoid arthritis without rheumatoid factor, unspecified site: Secondary | ICD-10-CM | POA: Diagnosis not present

## 2020-05-20 ENCOUNTER — Other Ambulatory Visit: Payer: Self-pay | Admitting: Nurse Practitioner

## 2020-05-20 DIAGNOSIS — F4310 Post-traumatic stress disorder, unspecified: Secondary | ICD-10-CM | POA: Diagnosis not present

## 2020-05-20 DIAGNOSIS — M0600 Rheumatoid arthritis without rheumatoid factor, unspecified site: Secondary | ICD-10-CM | POA: Diagnosis not present

## 2020-05-20 DIAGNOSIS — J302 Other seasonal allergic rhinitis: Secondary | ICD-10-CM

## 2020-05-20 DIAGNOSIS — F3110 Bipolar disorder, current episode manic without psychotic features, unspecified: Secondary | ICD-10-CM | POA: Diagnosis not present

## 2020-05-20 NOTE — Telephone Encounter (Signed)
Approved per protocol.  Requested Prescriptions  Pending Prescriptions Disp Refills   loratadine (CLARITIN) 10 MG tablet [Pharmacy Med Name: LORATADINE 10 MG TABLET] 30 tablet 3    Sig: TAKE 1 TABLET BY MOUTH EVERY DAY     Ear, Nose, and Throat:  Antihistamines Passed - 05/20/2020 10:47 AM      Passed - Valid encounter within last 12 months    Recent Outpatient Visits          2 months ago Left testicular pain   [place], FNP   4 months ago Essential hypertension   [facility], Lupita Raider, FNP   1 year ago Ganglion cyst of wrist, left   [facility] Merrilyn Puma, Jerrel Ivory, NP   1 year ago PTSD (post-traumatic stress disorder)   [facility] Mikey College, NP   1 year ago Major depressive disorder with current active episode, unspecified depression episode severity, unspecified whether recurrent   [facility] Mikey College, NP

## 2020-05-21 DIAGNOSIS — F4310 Post-traumatic stress disorder, unspecified: Secondary | ICD-10-CM | POA: Diagnosis not present

## 2020-05-21 DIAGNOSIS — M0600 Rheumatoid arthritis without rheumatoid factor, unspecified site: Secondary | ICD-10-CM | POA: Diagnosis not present

## 2020-05-22 DIAGNOSIS — M0600 Rheumatoid arthritis without rheumatoid factor, unspecified site: Secondary | ICD-10-CM | POA: Diagnosis not present

## 2020-05-22 DIAGNOSIS — F4310 Post-traumatic stress disorder, unspecified: Secondary | ICD-10-CM | POA: Diagnosis not present

## 2020-05-22 DIAGNOSIS — F3110 Bipolar disorder, current episode manic without psychotic features, unspecified: Secondary | ICD-10-CM | POA: Diagnosis not present

## 2020-05-23 ENCOUNTER — Other Ambulatory Visit: Payer: Self-pay | Admitting: Family Medicine

## 2020-05-23 DIAGNOSIS — M0600 Rheumatoid arthritis without rheumatoid factor, unspecified site: Secondary | ICD-10-CM | POA: Diagnosis not present

## 2020-05-23 DIAGNOSIS — F329 Major depressive disorder, single episode, unspecified: Secondary | ICD-10-CM

## 2020-05-23 DIAGNOSIS — F4310 Post-traumatic stress disorder, unspecified: Secondary | ICD-10-CM | POA: Diagnosis not present

## 2020-05-23 NOTE — Telephone Encounter (Signed)
Patient called and he would like a 90 day supply instead of 30 days.

## 2020-05-24 DIAGNOSIS — F4310 Post-traumatic stress disorder, unspecified: Secondary | ICD-10-CM | POA: Diagnosis not present

## 2020-05-24 DIAGNOSIS — M0600 Rheumatoid arthritis without rheumatoid factor, unspecified site: Secondary | ICD-10-CM | POA: Diagnosis not present

## 2020-05-27 ENCOUNTER — Other Ambulatory Visit: Payer: Self-pay | Admitting: Family Medicine

## 2020-05-27 DIAGNOSIS — K860 Alcohol-induced chronic pancreatitis: Secondary | ICD-10-CM

## 2020-05-27 DIAGNOSIS — F4310 Post-traumatic stress disorder, unspecified: Secondary | ICD-10-CM | POA: Diagnosis not present

## 2020-05-27 DIAGNOSIS — M0600 Rheumatoid arthritis without rheumatoid factor, unspecified site: Secondary | ICD-10-CM | POA: Diagnosis not present

## 2020-05-27 NOTE — Telephone Encounter (Signed)
Requested medication (s) are due for refill today -yes  Requested medication (s) are on the active medication list -yes  Future visit scheduled -no  Last refill: 03/28/20  Notes to clinic: Request RF of medication not assigned protocol  Requested Prescriptions  Pending Prescriptions Disp Refills   ZENPEP 15000-47000 units CPEP [Pharmacy Med Name: ZENPEP DR 15,000 UNIT CAPSULE] 90 capsule 5    Sig: TAKE 1 CAPSULE BY MOUTH 3 TIMES A DAY WITH MEALS      Off-Protocol Failed - 05/27/2020 10:41 AM      Failed - Medication not assigned to a protocol, review manually.      Passed - Valid encounter within last 12 months    Recent Outpatient Visits           2 months ago Left testicular pain   [facility], Lupita Raider, FNP   4 months ago Essential hypertension   [facility], Lupita Raider, FNP   1 year ago Ganglion cyst of wrist, left   [facility] Merrilyn Puma, Jerrel Ivory, NP   1 year ago PTSD (post-traumatic stress disorder)   [facility] Mikey College, NP   1 year ago Major depressive disorder with current active episode, unspecified depression episode severity, unspecified whether recurrent   [facility] Mikey College, NP                  Requested Prescriptions  Pending Prescriptions Disp Refills   ZENPEP 15000-47000 units CPEP [Pharmacy Med Name: ZENPEP DR 15,000 UNIT CAPSULE] 90 capsule 5    Sig: TAKE 1 CAPSULE BY MOUTH 3 TIMES A DAY WITH MEALS      Off-Protocol Failed - 05/27/2020 10:41 AM      Failed - Medication not assigned to a protocol, review manually.      Passed - Valid encounter within last 12 months    Recent Outpatient Visits           2 months ago Left testicular pain   [place], FNP   4 months ago Essential hypertension   [facility], Lupita Raider, FNP   1 year ago Ganglion cyst of wrist, left    [facility] Merrilyn Puma, Jerrel Ivory, NP   1 year ago PTSD (post-traumatic stress disorder)   [facility] Mikey College, NP   1 year ago Major depressive disorder with current active episode, unspecified depression episode severity, unspecified whether recurrent   [facility] Mikey College, NP

## 2020-05-27 NOTE — Telephone Encounter (Signed)
Copied from [place] [id number]. Topic: Quick Communication - Rx Refill/Question >> May 27, 2020  2:40 PM Jodie Echevaria wrote: Medication: ZENPEP 50388-82800 units CPEP  Asking for refill until appointment on 06/06/20  Has the patient contacted their pharmacy? Yes.   (Agent: If no, request that the patient contact the pharmacy for the refill.) (Agent: If yes, when and what did the pharmacy advise?)  Preferred Pharmacy (with phone number or street name): [id number] [place], [place] [address]  Phone:  [phone] Fax:  [phone]     Agent: Please be advised that RX refills may take up to 3 business days. We ask that you follow-up with your pharmacy.

## 2020-05-28 DIAGNOSIS — F3110 Bipolar disorder, current episode manic without psychotic features, unspecified: Secondary | ICD-10-CM | POA: Diagnosis not present

## 2020-05-28 DIAGNOSIS — F4310 Post-traumatic stress disorder, unspecified: Secondary | ICD-10-CM | POA: Diagnosis not present

## 2020-05-28 DIAGNOSIS — M0600 Rheumatoid arthritis without rheumatoid factor, unspecified site: Secondary | ICD-10-CM | POA: Diagnosis not present

## 2020-05-29 DIAGNOSIS — F4310 Post-traumatic stress disorder, unspecified: Secondary | ICD-10-CM | POA: Diagnosis not present

## 2020-05-29 DIAGNOSIS — F3110 Bipolar disorder, current episode manic without psychotic features, unspecified: Secondary | ICD-10-CM | POA: Diagnosis not present

## 2020-05-29 DIAGNOSIS — M0600 Rheumatoid arthritis without rheumatoid factor, unspecified site: Secondary | ICD-10-CM | POA: Diagnosis not present

## 2020-05-30 DIAGNOSIS — F4310 Post-traumatic stress disorder, unspecified: Secondary | ICD-10-CM | POA: Diagnosis not present

## 2020-05-30 DIAGNOSIS — M0600 Rheumatoid arthritis without rheumatoid factor, unspecified site: Secondary | ICD-10-CM | POA: Diagnosis not present

## 2020-05-31 DIAGNOSIS — F4310 Post-traumatic stress disorder, unspecified: Secondary | ICD-10-CM | POA: Diagnosis not present

## 2020-05-31 DIAGNOSIS — M0600 Rheumatoid arthritis without rheumatoid factor, unspecified site: Secondary | ICD-10-CM | POA: Diagnosis not present

## 2020-06-03 DIAGNOSIS — F4310 Post-traumatic stress disorder, unspecified: Secondary | ICD-10-CM | POA: Diagnosis not present

## 2020-06-03 DIAGNOSIS — M0600 Rheumatoid arthritis without rheumatoid factor, unspecified site: Secondary | ICD-10-CM | POA: Diagnosis not present

## 2020-06-04 DIAGNOSIS — M0600 Rheumatoid arthritis without rheumatoid factor, unspecified site: Secondary | ICD-10-CM | POA: Diagnosis not present

## 2020-06-04 DIAGNOSIS — F3110 Bipolar disorder, current episode manic without psychotic features, unspecified: Secondary | ICD-10-CM | POA: Diagnosis not present

## 2020-06-04 DIAGNOSIS — F4310 Post-traumatic stress disorder, unspecified: Secondary | ICD-10-CM | POA: Diagnosis not present

## 2020-06-05 DIAGNOSIS — M0600 Rheumatoid arthritis without rheumatoid factor, unspecified site: Secondary | ICD-10-CM | POA: Diagnosis not present

## 2020-06-05 DIAGNOSIS — F4310 Post-traumatic stress disorder, unspecified: Secondary | ICD-10-CM | POA: Diagnosis not present

## 2020-06-06 ENCOUNTER — Encounter: Payer: Self-pay | Admitting: Family Medicine

## 2020-06-06 ENCOUNTER — Ambulatory Visit (INDEPENDENT_AMBULATORY_CARE_PROVIDER_SITE_OTHER): Payer: Medicaid Other | Admitting: Family Medicine

## 2020-06-06 ENCOUNTER — Other Ambulatory Visit: Payer: Self-pay

## 2020-06-06 VITALS — BP 121/80 | HR 73 | Temp 98.3°F | Resp 18 | Ht 67.0 in | Wt 179.2 lb

## 2020-06-06 DIAGNOSIS — K860 Alcohol-induced chronic pancreatitis: Secondary | ICD-10-CM | POA: Diagnosis not present

## 2020-06-06 DIAGNOSIS — F3110 Bipolar disorder, current episode manic without psychotic features, unspecified: Secondary | ICD-10-CM | POA: Diagnosis not present

## 2020-06-06 DIAGNOSIS — R635 Abnormal weight gain: Secondary | ICD-10-CM | POA: Diagnosis not present

## 2020-06-06 DIAGNOSIS — F329 Major depressive disorder, single episode, unspecified: Secondary | ICD-10-CM

## 2020-06-06 DIAGNOSIS — I10 Essential (primary) hypertension: Secondary | ICD-10-CM

## 2020-06-06 DIAGNOSIS — J302 Other seasonal allergic rhinitis: Secondary | ICD-10-CM | POA: Insufficient documentation

## 2020-06-06 DIAGNOSIS — F4310 Post-traumatic stress disorder, unspecified: Secondary | ICD-10-CM | POA: Diagnosis not present

## 2020-06-06 DIAGNOSIS — Z79899 Other long term (current) drug therapy: Secondary | ICD-10-CM | POA: Diagnosis not present

## 2020-06-06 DIAGNOSIS — M0600 Rheumatoid arthritis without rheumatoid factor, unspecified site: Secondary | ICD-10-CM | POA: Diagnosis not present

## 2020-06-06 DIAGNOSIS — M87051 Idiopathic aseptic necrosis of right femur: Secondary | ICD-10-CM

## 2020-06-06 DIAGNOSIS — R351 Nocturia: Secondary | ICD-10-CM | POA: Diagnosis not present

## 2020-06-06 MED ORDER — BUPROPION HCL ER (XL) 150 MG PO TB24: 150.0000 mg | ORAL_TABLET | Freq: Every day | ORAL | 1 refills | Status: DC

## 2020-06-06 MED ORDER — FLUTICASONE PROPIONATE 50 MCG/ACT NA SUSP: 1.0000 | Freq: Every day | NASAL | 1 refills | Status: DC | PRN

## 2020-06-06 MED ORDER — GABAPENTIN 100 MG PO CAPS: 100.0000 mg | ORAL_CAPSULE | Freq: Every evening | ORAL | 1 refills | Status: DC | PRN

## 2020-06-06 MED ORDER — ZENPEP 15000-47000 UNITS PO CPEP: ORAL_CAPSULE | ORAL | 1 refills | Status: DC

## 2020-06-06 MED ORDER — NAPROXEN 500 MG PO TABS: 500.0000 mg | ORAL_TABLET | Freq: Two times a day (BID) | ORAL | 0 refills | Status: DC | PRN

## 2020-06-06 NOTE — Assessment & Plan Note (Signed)
See depression A/P. 

## 2020-06-06 NOTE — Assessment & Plan Note (Signed)
Reports stable and well controlled with bupropion XL 150mg  daily.  Will continue.  Refills sent to pharmacy on file.

## 2020-06-06 NOTE — Patient Instructions (Signed)
Your provider would like to you have your annual eye exam. Please contact your current eye doctor or here are some good options for you to contact.   [facility]   Address: [address] [place], [place] [address] Phone: [phone]  Website: [email or web address] [address], [place], [place] [address] Phone: [phone] [email or web address]  [facility]  Address: [place], [place], [place] [address] Phone: [phone]   [facility] [address] [place], [place] [address] Phone: [phone]  [facility] Address: [place], [place], [place] [address]  Phone: [phone]  Have your labs drawn today and we will contact you with the results  I have sent in refills on your prescription to your pharmacy on file  We will plan to see you back in 6 months for hypertension follow up visit  You will receive a survey after today's visit either digitally by e-mail or paper by USPS mail. Your experiences and feedback matter to [place].  Please respond so we know how we are doing as we provide care for you.  Call us with any questions/concerns/needs.  It is my goal to be available to you for your health concerns.  Thanks for choosing me to be a partner in your healthcare needs!  Harlin Rain, FNP-C Family Nurse Practitioner [facility] Phone: [phone]

## 2020-06-06 NOTE — Assessment & Plan Note (Signed)
Stable and well controlled with fluticasone nasal spray.  Will send refills in.  Continue.  Follow up in 6 months.

## 2020-06-06 NOTE — Assessment & Plan Note (Signed)
Chronic, stable with Zenpep as directed.  Will send in refill to pharmacy on file.  Continue.

## 2020-06-06 NOTE — Assessment & Plan Note (Signed)
Controlled hypertension.  BP is at goal < 130/80.  Pt is not working on lifestyle modifications.  Taking medications tolerating well without side effects.  Complications:  Chronic pancreatitis, CKD stage 3, history of acute renal failure, tobacco use disorder, ETOH use disorder, chronic pain  Plan: 1. Continue taking amlodipine 10mg  and losartan 100mg  daily 2. Obtain labs today  3. Encouraged heart healthy diet and increasing exercise to 30 minutes most days of the week, going no more than 2 days in a row without exercise. 4. Check BP 1-2 x per week at home, keep log, and bring to clinic at next appointment. 5. Follow up 6 months.

## 2020-06-06 NOTE — Assessment & Plan Note (Signed)
PSA to be drawn for evaluation

## 2020-06-06 NOTE — Progress Notes (Signed)
Subjective:    Patient ID: Noah Velasquez, male    DOB: Jun 16, 1966, 54 y.o.   MRN: [id number]  Noah Velasquez is a 54 y.o. male presenting on 06/06/2020 for Hypertension   HPI   Noah Velasquez presents to clinic for follow up on his hypertension, medication refills and needing to establish with an eye doctor.  Hypertension - He is checking BP at home or outside of clinic.    - Current medications: amlodipine 10mg  and losartan 100mg  daily, tolerating well without side effects - He is not currently symptomatic. - Pt denies headache, lightheadedness, dizziness, changes in vision, chest tightness/pressure, palpitations, leg swelling, sudden loss of speech or loss of consciousness. - He  reports no regular exercise routine. - His diet is high in salt, high in fat, and high in carbohydrates.  Depression screen [facility] 2/9 01/10/2020 04/14/2019 10/20/2018  Decreased Interest 0 0 1  Down, Depressed, Hopeless 0 0 0  PHQ - 2 Score 0 0 1  Altered sleeping 1 0 2  Tired, decreased energy 1 1 1   Change in appetite 2 0 1  Feeling bad or failure about yourself  0 0 0  Trouble concentrating 0 0 2  Moving slowly or fidgety/restless 0 0 0  Suicidal thoughts 0 0 0  PHQ-9 Score 4 1 7   Difficult doing work/chores Not difficult at all Not difficult at all Not difficult at all  Some encounter information is confidential and restricted. Go to Review Flowsheets activity to see all data.    Social History   Tobacco Use  . Smoking status: Current Every Day Smoker    Packs/day: 0.25    Years: 15.00    Pack years: 3.75    Types: Cigarettes  . Smokeless tobacco: Never Used  Vaping Use  . Vaping Use: Never used  Substance Use Topics  . Alcohol use: Yes    Alcohol/week: 7.0 standard drinks    Types: 7 Cans of beer per week    Comment: weekly  . Drug use: Yes    Types: Marijuana    Review of Systems  Constitutional: Negative.   HENT: Negative.   Eyes: Negative.   Respiratory: Negative.     Cardiovascular: Negative.   Gastrointestinal: Negative.   Endocrine: Negative.   Genitourinary: Negative.   Musculoskeletal: Negative.   Skin: Negative.   Allergic/Immunologic: Negative.   Neurological: Negative.   Hematological: Negative.   Psychiatric/Behavioral: Negative.    Per HPI unless specifically indicated above     Objective:    BP 121/80 (BP Location: Right Arm, Patient Position: Sitting, Cuff Size: Normal)   Pulse 73   Temp 98.3 F (36.8 C) (Oral)   Resp 18   Ht 5\' 7"  (1.702 m)   Wt 179 lb 3.2 oz (81.3 kg)   SpO2 98%   BMI 28.07 kg/m   Wt Readings from Last 3 Encounters:  06/06/20 179 lb 3.2 oz (81.3 kg)  03/07/20 174 lb (78.9 kg)  03/06/20 178 lb 3.2 oz (80.8 kg)    Physical Exam Vitals and nursing note reviewed.  Constitutional:      General: He is not in acute distress.    Appearance: Normal appearance. He is well-developed, well-groomed and overweight. He is not ill-appearing or toxic-appearing.  HENT:     Head: Normocephalic and atraumatic.     Nose:     Comments: [name] is in place, covering mouth and nose. Eyes:     General:        Right  eye: No discharge.        Left eye: No discharge.     Extraocular Movements: Extraocular movements intact.     Conjunctiva/sclera: Conjunctivae normal.     Pupils: Pupils are equal, round, and reactive to light.  Cardiovascular:     Rate and Rhythm: Normal rate and regular rhythm.     Pulses: Normal pulses.     Heart sounds: Normal heart sounds. No murmur heard.  No friction rub. No gallop.   Pulmonary:     Effort: Pulmonary effort is normal. No respiratory distress.     Breath sounds: Normal breath sounds.  Musculoskeletal:     Right lower leg: No edema.     Left lower leg: No edema.  Skin:    General: Skin is warm and dry.     Capillary Refill: Capillary refill takes less than 2 seconds.  Neurological:     General: No focal deficit present.     Mental Status: He is alert and oriented to person,  place, and time.     Comments: Using motorized chair and ambulating with cane as needed  Psychiatric:        Attention and Perception: Attention and perception normal.        Mood and Affect: Mood and affect normal.        Speech: Speech normal.        Behavior: Behavior normal. Behavior is cooperative.        Thought Content: Thought content normal.        Cognition and Memory: Cognition and memory normal.    Results for orders placed or performed in visit on 01/10/20  Thyroid Panel With TSH  Result Value Ref Range   T3 Uptake 34 22 - 35 %   T4, Total 6.8 4.9 - 10.5 mcg/dL   Free Thyroxine Index 2.3 1.4 - 3.8   TSH 1.59 0.40 - 4.50 mIU/L  POCT HgB A1C  Result Value Ref Range   Hemoglobin A1C 5.2 4.0 - 5.6 %   HbA1c POC (<> result, manual entry)     HbA1c, POC (prediabetic range)     HbA1c, POC (controlled diabetic range)        Assessment & Plan:   Problem List Items Addressed This Visit      Cardiovascular and Mediastinum   Essential hypertension - Primary    Controlled hypertension.  BP is at goal < 130/80.  Pt is not working on lifestyle modifications.  Taking medications tolerating well without side effects.  Complications:  Chronic pancreatitis, CKD stage 3, history of acute renal failure, tobacco use disorder, ETOH use disorder, chronic pain  Plan: 1. Continue taking amlodipine 10mg  and losartan 100mg  daily 2. Obtain labs today  3. Encouraged heart healthy diet and increasing exercise to 30 minutes most days of the week, going no more than 2 days in a row without exercise. 4. Check BP 1-2 x per week at home, keep log, and bring to clinic at next appointment. 5. Follow up 6 months.       Relevant Orders   CBC with Differential   COMPLETE METABOLIC PANEL WITH GFR     Respiratory   Seasonal allergic rhinitis    Stable and well controlled with fluticasone nasal spray.  Will send refills in.  Continue.  Follow up in 6 months.      Relevant Medications    fluticasone (FLONASE) 50 MCG/ACT nasal spray     Digestive   Pancreatitis  Chronic, stable with Zenpep as directed.  Will send in refill to pharmacy on file.  Continue.      Relevant Medications   Pancrelipase, Lip-Prot-Amyl, (ZENPEP) 15000-47000 units CPEP     Musculoskeletal and Integument   Avascular necrosis of bone of right hip (HCC)    Stable and well controlled with Gabapentin 100mg  PO QHS PRN.  Continue.  Will send refills to pharmacy on file.      Relevant Medications   gabapentin (NEURONTIN) 100 MG capsule     Other   Depression    Reports stable and well controlled with bupropion XL 150mg  daily.  Will continue.  Refills sent to pharmacy on file.      Relevant Medications   buPROPion (WELLBUTRIN XL) 150 MG 24 hr tablet   Nocturia    PSA to be drawn for evaluation      Relevant Orders   PSA   Posttraumatic stress disorder    See depression A/P      Relevant Medications   buPROPion (WELLBUTRIN XL) 150 MG 24 hr tablet    Other Visit Diagnoses    Weight gain       Relevant Orders   TSH + free T4   Long-term use of high-risk medication       Relevant Orders   Lipid Profile      Meds ordered this encounter  Medications  . buPROPion (WELLBUTRIN XL) 150 MG 24 hr tablet    Sig: Take 1 tablet (150 mg total) by mouth daily.    Dispense:  90 tablet    Refill:  1  . fluticasone (FLONASE) 50 MCG/ACT nasal spray    Sig: Place 1 spray into both nostrils daily as needed for allergies or rhinitis.    Dispense:  16 mL    Refill:  1  . gabapentin (NEURONTIN) 100 MG capsule    Sig: Take 1 capsule (100 mg total) by mouth at bedtime as needed (sleep).    Dispense:  90 capsule    Refill:  1  . Pancrelipase, Lip-Prot-Amyl, (ZENPEP) 15000-47000 units CPEP    Sig: TAKE 1 CAPSULE BY MOUTH 3 (THREE) TIMES DAILY WITH MEALS.    Dispense:  270 capsule    Refill:  1  . naproxen (NAPROSYN) 500 MG tablet    Sig: Take 1 tablet (500 mg total) by mouth 2 (two) times daily as  needed for mild pain or moderate pain.    Dispense:  20 tablet    Refill:  0    Follow up plan: Return in about 6 months (around 12/05/2020) for HTN F/U.   Harlin Rain, [place] Family Nurse Practitioner [facility] 06/06/2020, 12:21 PM

## 2020-06-06 NOTE — Assessment & Plan Note (Signed)
Stable and well controlled with Gabapentin 100mg  PO QHS PRN.  Continue.  Will send refills to pharmacy on file.

## 2020-06-07 ENCOUNTER — Telehealth: Payer: Self-pay

## 2020-06-07 ENCOUNTER — Other Ambulatory Visit: Payer: Self-pay | Admitting: Family Medicine

## 2020-06-07 DIAGNOSIS — M0600 Rheumatoid arthritis without rheumatoid factor, unspecified site: Secondary | ICD-10-CM | POA: Diagnosis not present

## 2020-06-07 DIAGNOSIS — F4310 Post-traumatic stress disorder, unspecified: Secondary | ICD-10-CM | POA: Diagnosis not present

## 2020-06-07 DIAGNOSIS — N529 Male erectile dysfunction, unspecified: Secondary | ICD-10-CM

## 2020-06-07 LAB — LIPID PANEL
Cholesterol: 150 mg/dL (ref <200)
HDL: 48 mg/dL (ref >=40)
LDL Cholesterol (Calc): 88 mg/dL (calc)
Non-HDL Cholesterol (Calc): 102 mg/dL (calc) (ref <130)
Total CHOL/HDL Ratio: 3.1 (calc) (ref <5.0)
Triglycerides: 59 mg/dL (ref <150)

## 2020-06-07 LAB — CBC WITH DIFFERENTIAL/PLATELET
Absolute Monocytes: 367 cells/uL (ref 200–950)
Basophils Absolute: 70 cells/uL (ref 0–200)
Basophils Relative: 0.9 %
Eosinophils Absolute: 476 cells/uL (ref 15–500)
Eosinophils Relative: 6.1 %
HCT: 41.7 % (ref 38.5–50.0)
Hemoglobin: 14.1 g/dL (ref 13.2–17.1)
Lymphs Abs: 2075 cells/uL (ref 850–3900)
MCH: 32 pg (ref 27.0–33.0)
MCHC: 33.8 g/dL (ref 32.0–36.0)
MCV: 94.8 fL (ref 80.0–100.0)
MPV: 10.2 fL (ref 7.5–12.5)
Monocytes Relative: 4.7 %
Neutro Abs: 4813 cells/uL (ref 1500–7800)
Neutrophils Relative %: 61.7 %
Platelets: 307 10*3/uL (ref 140–400)
RBC: 4.4 10*6/uL (ref 4.20–5.80)
RDW: 12.3 % (ref 11.0–15.0)
Total Lymphocyte: 26.6 %
WBC: 7.8 10*3/uL (ref 3.8–10.8)

## 2020-06-07 LAB — COMPLETE METABOLIC PANEL WITH GFR
AG Ratio: 0.8 (calc) — ABNORMAL LOW (ref 1.0–2.5)
ALT: 9 U/L (ref 9–46)
AST: 16 U/L (ref 10–35)
Albumin: 3.9 g/dL (ref 3.6–5.1)
Alkaline phosphatase (APISO): 83 U/L (ref 35–144)
BUN: 11 mg/dL (ref 7–25)
CO2: 23 mmol/L (ref 20–32)
Calcium: 9.1 mg/dL (ref 8.6–10.3)
Chloride: 103 mmol/L (ref 98–110)
Creat: 1.26 mg/dL (ref 0.70–1.33)
GFR, Est African American: 74 mL/min/{1.73_m2} (ref >=60)
GFR, Est Non African American: 64 mL/min/{1.73_m2} (ref >=60)
Globulin: 4.9 g/dL (calc) — ABNORMAL HIGH (ref 1.9–3.7)
Glucose, Bld: 99 mg/dL (ref 65–99)
Potassium: 4.1 mmol/L (ref 3.5–5.3)
Sodium: 134 mmol/L — ABNORMAL LOW (ref 135–146)
Total Bilirubin: 0.7 mg/dL (ref 0.2–1.2)
Total Protein: 8.8 g/dL — ABNORMAL HIGH (ref 6.1–8.1)

## 2020-06-07 LAB — TSH+FREE T4: TSH W/REFLEX TO FT4: 1.16 mIU/L (ref 0.40–4.50)

## 2020-06-07 LAB — PSA: PSA: 0.46 ng/mL (ref <=4.0)

## 2020-06-07 MED ORDER — SILDENAFIL CITRATE 25 MG PO TABS: 25.0000 mg | ORAL_TABLET | Freq: Every day | ORAL | 0 refills | Status: DC | PRN

## 2020-06-07 NOTE — Telephone Encounter (Signed)
The patient called requesting that you change his sildenafil dosage from 25 to 20MG . He said the prescription that was sent would cost him over $100.00 vs the 20MG  dosage will only be $11.00.

## 2020-06-07 NOTE — Telephone Encounter (Signed)
Copied from [place] [id number]. Topic: Quick Communication - Rx Refill/Question >> Jun 07, 2020 11:21 AM Hinda Lenis D wrote: Pt need a call back from a nurse to go over the medications from yesterday / please advise

## 2020-06-07 NOTE — Telephone Encounter (Signed)
Rx sent to pharmacy on file.

## 2020-06-07 NOTE — Telephone Encounter (Signed)
The pt called requesting that you change his Cialis over to Sildenafil because it would be cheaper for him.

## 2020-06-10 ENCOUNTER — Other Ambulatory Visit: Payer: Self-pay | Admitting: Family Medicine

## 2020-06-10 DIAGNOSIS — F4310 Post-traumatic stress disorder, unspecified: Secondary | ICD-10-CM | POA: Diagnosis not present

## 2020-06-10 DIAGNOSIS — N529 Male erectile dysfunction, unspecified: Secondary | ICD-10-CM

## 2020-06-10 DIAGNOSIS — M0600 Rheumatoid arthritis without rheumatoid factor, unspecified site: Secondary | ICD-10-CM | POA: Diagnosis not present

## 2020-06-10 MED ORDER — SILDENAFIL CITRATE 20 MG PO TABS: 20.0000 mg | ORAL_TABLET | Freq: Every day | ORAL | 0 refills | Status: DC | PRN

## 2020-06-10 NOTE — Telephone Encounter (Signed)
Updated and sent Rx to pharmacy on file

## 2020-06-11 DIAGNOSIS — F4310 Post-traumatic stress disorder, unspecified: Secondary | ICD-10-CM | POA: Diagnosis not present

## 2020-06-11 DIAGNOSIS — M0600 Rheumatoid arthritis without rheumatoid factor, unspecified site: Secondary | ICD-10-CM | POA: Diagnosis not present

## 2020-06-12 DIAGNOSIS — M0600 Rheumatoid arthritis without rheumatoid factor, unspecified site: Secondary | ICD-10-CM | POA: Diagnosis not present

## 2020-06-12 DIAGNOSIS — F3110 Bipolar disorder, current episode manic without psychotic features, unspecified: Secondary | ICD-10-CM | POA: Diagnosis not present

## 2020-06-12 DIAGNOSIS — F4310 Post-traumatic stress disorder, unspecified: Secondary | ICD-10-CM | POA: Diagnosis not present

## 2020-06-13 DIAGNOSIS — F4310 Post-traumatic stress disorder, unspecified: Secondary | ICD-10-CM | POA: Diagnosis not present

## 2020-06-13 DIAGNOSIS — M0600 Rheumatoid arthritis without rheumatoid factor, unspecified site: Secondary | ICD-10-CM | POA: Diagnosis not present

## 2020-06-14 DIAGNOSIS — F3110 Bipolar disorder, current episode manic without psychotic features, unspecified: Secondary | ICD-10-CM | POA: Diagnosis not present

## 2020-06-14 DIAGNOSIS — M0600 Rheumatoid arthritis without rheumatoid factor, unspecified site: Secondary | ICD-10-CM | POA: Diagnosis not present

## 2020-06-14 DIAGNOSIS — F4310 Post-traumatic stress disorder, unspecified: Secondary | ICD-10-CM | POA: Diagnosis not present

## 2020-06-17 DIAGNOSIS — M0600 Rheumatoid arthritis without rheumatoid factor, unspecified site: Secondary | ICD-10-CM | POA: Diagnosis not present

## 2020-06-17 DIAGNOSIS — F4310 Post-traumatic stress disorder, unspecified: Secondary | ICD-10-CM | POA: Diagnosis not present

## 2020-06-18 DIAGNOSIS — F4310 Post-traumatic stress disorder, unspecified: Secondary | ICD-10-CM | POA: Diagnosis not present

## 2020-06-18 DIAGNOSIS — M0600 Rheumatoid arthritis without rheumatoid factor, unspecified site: Secondary | ICD-10-CM | POA: Diagnosis not present

## 2020-06-19 DIAGNOSIS — M0600 Rheumatoid arthritis without rheumatoid factor, unspecified site: Secondary | ICD-10-CM | POA: Diagnosis not present

## 2020-06-19 DIAGNOSIS — F4310 Post-traumatic stress disorder, unspecified: Secondary | ICD-10-CM | POA: Diagnosis not present

## 2020-06-20 DIAGNOSIS — F4310 Post-traumatic stress disorder, unspecified: Secondary | ICD-10-CM | POA: Diagnosis not present

## 2020-06-20 DIAGNOSIS — M0600 Rheumatoid arthritis without rheumatoid factor, unspecified site: Secondary | ICD-10-CM | POA: Diagnosis not present

## 2020-06-21 DIAGNOSIS — F4310 Post-traumatic stress disorder, unspecified: Secondary | ICD-10-CM | POA: Diagnosis not present

## 2020-06-21 DIAGNOSIS — F3110 Bipolar disorder, current episode manic without psychotic features, unspecified: Secondary | ICD-10-CM | POA: Diagnosis not present

## 2020-06-21 DIAGNOSIS — M0600 Rheumatoid arthritis without rheumatoid factor, unspecified site: Secondary | ICD-10-CM | POA: Diagnosis not present

## 2020-06-22 DIAGNOSIS — F3110 Bipolar disorder, current episode manic without psychotic features, unspecified: Secondary | ICD-10-CM | POA: Diagnosis not present

## 2020-06-24 DIAGNOSIS — F4310 Post-traumatic stress disorder, unspecified: Secondary | ICD-10-CM | POA: Diagnosis not present

## 2020-06-24 DIAGNOSIS — M0600 Rheumatoid arthritis without rheumatoid factor, unspecified site: Secondary | ICD-10-CM | POA: Diagnosis not present

## 2020-06-25 DIAGNOSIS — M0600 Rheumatoid arthritis without rheumatoid factor, unspecified site: Secondary | ICD-10-CM | POA: Diagnosis not present

## 2020-06-25 DIAGNOSIS — F4310 Post-traumatic stress disorder, unspecified: Secondary | ICD-10-CM | POA: Diagnosis not present

## 2020-06-26 DIAGNOSIS — M0600 Rheumatoid arthritis without rheumatoid factor, unspecified site: Secondary | ICD-10-CM | POA: Diagnosis not present

## 2020-06-26 DIAGNOSIS — F4310 Post-traumatic stress disorder, unspecified: Secondary | ICD-10-CM | POA: Diagnosis not present

## 2020-06-27 DIAGNOSIS — M0600 Rheumatoid arthritis without rheumatoid factor, unspecified site: Secondary | ICD-10-CM | POA: Diagnosis not present

## 2020-06-27 DIAGNOSIS — F4310 Post-traumatic stress disorder, unspecified: Secondary | ICD-10-CM | POA: Diagnosis not present

## 2020-06-28 DIAGNOSIS — M0600 Rheumatoid arthritis without rheumatoid factor, unspecified site: Secondary | ICD-10-CM | POA: Diagnosis not present

## 2020-06-28 DIAGNOSIS — F3110 Bipolar disorder, current episode manic without psychotic features, unspecified: Secondary | ICD-10-CM | POA: Diagnosis not present

## 2020-06-28 DIAGNOSIS — F4310 Post-traumatic stress disorder, unspecified: Secondary | ICD-10-CM | POA: Diagnosis not present

## 2020-06-29 DIAGNOSIS — F3110 Bipolar disorder, current episode manic without psychotic features, unspecified: Secondary | ICD-10-CM | POA: Diagnosis not present

## 2020-07-01 DIAGNOSIS — M0600 Rheumatoid arthritis without rheumatoid factor, unspecified site: Secondary | ICD-10-CM | POA: Diagnosis not present

## 2020-07-01 DIAGNOSIS — F4310 Post-traumatic stress disorder, unspecified: Secondary | ICD-10-CM | POA: Diagnosis not present

## 2020-07-02 DIAGNOSIS — F4310 Post-traumatic stress disorder, unspecified: Secondary | ICD-10-CM | POA: Diagnosis not present

## 2020-07-02 DIAGNOSIS — M0600 Rheumatoid arthritis without rheumatoid factor, unspecified site: Secondary | ICD-10-CM | POA: Diagnosis not present

## 2020-07-03 DIAGNOSIS — F4310 Post-traumatic stress disorder, unspecified: Secondary | ICD-10-CM | POA: Diagnosis not present

## 2020-07-03 DIAGNOSIS — M0600 Rheumatoid arthritis without rheumatoid factor, unspecified site: Secondary | ICD-10-CM | POA: Diagnosis not present

## 2020-07-03 DIAGNOSIS — F3110 Bipolar disorder, current episode manic without psychotic features, unspecified: Secondary | ICD-10-CM | POA: Diagnosis not present

## 2020-07-04 DIAGNOSIS — M0600 Rheumatoid arthritis without rheumatoid factor, unspecified site: Secondary | ICD-10-CM | POA: Diagnosis not present

## 2020-07-04 DIAGNOSIS — F4310 Post-traumatic stress disorder, unspecified: Secondary | ICD-10-CM | POA: Diagnosis not present

## 2020-07-05 DIAGNOSIS — M0600 Rheumatoid arthritis without rheumatoid factor, unspecified site: Secondary | ICD-10-CM | POA: Diagnosis not present

## 2020-07-05 DIAGNOSIS — F4310 Post-traumatic stress disorder, unspecified: Secondary | ICD-10-CM | POA: Diagnosis not present

## 2020-07-05 DIAGNOSIS — F3110 Bipolar disorder, current episode manic without psychotic features, unspecified: Secondary | ICD-10-CM | POA: Diagnosis not present

## 2020-07-10 ENCOUNTER — Other Ambulatory Visit: Payer: Self-pay | Admitting: Family Medicine

## 2020-07-10 DIAGNOSIS — I10 Essential (primary) hypertension: Secondary | ICD-10-CM

## 2020-07-12 DIAGNOSIS — F3110 Bipolar disorder, current episode manic without psychotic features, unspecified: Secondary | ICD-10-CM | POA: Diagnosis not present

## 2020-07-13 DIAGNOSIS — F3110 Bipolar disorder, current episode manic without psychotic features, unspecified: Secondary | ICD-10-CM | POA: Diagnosis not present

## 2020-07-15 DIAGNOSIS — F3110 Bipolar disorder, current episode manic without psychotic features, unspecified: Secondary | ICD-10-CM | POA: Diagnosis not present

## 2020-07-16 DIAGNOSIS — F3110 Bipolar disorder, current episode manic without psychotic features, unspecified: Secondary | ICD-10-CM | POA: Diagnosis not present

## 2020-07-26 DIAGNOSIS — F3110 Bipolar disorder, current episode manic without psychotic features, unspecified: Secondary | ICD-10-CM | POA: Diagnosis not present

## 2020-07-27 DIAGNOSIS — F3110 Bipolar disorder, current episode manic without psychotic features, unspecified: Secondary | ICD-10-CM | POA: Diagnosis not present

## 2020-07-29 DIAGNOSIS — F3110 Bipolar disorder, current episode manic without psychotic features, unspecified: Secondary | ICD-10-CM | POA: Diagnosis not present

## 2020-07-31 DIAGNOSIS — F3110 Bipolar disorder, current episode manic without psychotic features, unspecified: Secondary | ICD-10-CM | POA: Diagnosis not present

## 2020-08-05 DIAGNOSIS — F3110 Bipolar disorder, current episode manic without psychotic features, unspecified: Secondary | ICD-10-CM | POA: Diagnosis not present

## 2020-08-07 DIAGNOSIS — F3110 Bipolar disorder, current episode manic without psychotic features, unspecified: Secondary | ICD-10-CM | POA: Diagnosis not present

## 2020-08-12 DIAGNOSIS — F3110 Bipolar disorder, current episode manic without psychotic features, unspecified: Secondary | ICD-10-CM | POA: Diagnosis not present

## 2020-08-13 DIAGNOSIS — F3110 Bipolar disorder, current episode manic without psychotic features, unspecified: Secondary | ICD-10-CM | POA: Diagnosis not present

## 2020-08-20 ENCOUNTER — Other Ambulatory Visit: Payer: Self-pay | Admitting: Family Medicine

## 2020-08-20 DIAGNOSIS — F4310 Post-traumatic stress disorder, unspecified: Secondary | ICD-10-CM

## 2020-08-20 DIAGNOSIS — F329 Major depressive disorder, single episode, unspecified: Secondary | ICD-10-CM

## 2020-08-22 DIAGNOSIS — F3110 Bipolar disorder, current episode manic without psychotic features, unspecified: Secondary | ICD-10-CM | POA: Diagnosis not present

## 2020-08-23 DIAGNOSIS — F3110 Bipolar disorder, current episode manic without psychotic features, unspecified: Secondary | ICD-10-CM | POA: Diagnosis not present

## 2020-08-27 ENCOUNTER — Telehealth: Payer: Self-pay | Admitting: Family Medicine

## 2020-08-27 NOTE — Telephone Encounter (Signed)
He should be able to pick up at a local pharmacy.  Does he want to pick up a prescription for this?  TY

## 2020-08-27 NOTE — Telephone Encounter (Signed)
Pt would like an order for a Blood pressure cuff sent to his home asap /  Please advise   He stated his insurance will cover it / Pts address is current

## 2020-08-28 ENCOUNTER — Telehealth: Payer: Self-pay

## 2020-08-28 NOTE — Telephone Encounter (Signed)
I contacted the patient and he informed me that he will contact his insurance and find out the necessary steps on how he can get the blood pressure cuff mail out to him.

## 2020-08-28 NOTE — Telephone Encounter (Signed)
Copied from [place] [id number]. Topic: General - Other >> Aug 28, 2020  2:23 PM Celene Kras wrote: Reason for CRM: Pt called and is requesting to have a prescription for a ambulatory BP cuff. He states that he is needing to have a referral sent to [employer]. Please advise.   Fax# [phone]

## 2020-08-29 DIAGNOSIS — F3110 Bipolar disorder, current episode manic without psychotic features, unspecified: Secondary | ICD-10-CM | POA: Diagnosis not present

## 2020-08-29 NOTE — Telephone Encounter (Signed)
If you write the prescription I will fax the order over to his insurance company.

## 2020-08-29 NOTE — Telephone Encounter (Signed)
He can get from a local pharmacy.  Did he want me to write a prescription (they don't electronically send to the pharmacy) and he can pick one up tomorrow?  TY

## 2020-08-30 ENCOUNTER — Other Ambulatory Visit: Payer: Self-pay | Admitting: Family Medicine

## 2020-08-30 DIAGNOSIS — I10 Essential (primary) hypertension: Secondary | ICD-10-CM

## 2020-08-30 DIAGNOSIS — F3110 Bipolar disorder, current episode manic without psychotic features, unspecified: Secondary | ICD-10-CM | POA: Diagnosis not present

## 2020-08-30 MED ORDER — BLOOD PRESSURE KIT: PACK | 0 refills | Status: DC

## 2020-08-30 NOTE — Telephone Encounter (Signed)
Rx written.

## 2020-09-05 ENCOUNTER — Other Ambulatory Visit: Payer: Self-pay | Admitting: Family Medicine

## 2020-09-05 DIAGNOSIS — K219 Gastro-esophageal reflux disease without esophagitis: Secondary | ICD-10-CM

## 2020-09-05 DIAGNOSIS — F3110 Bipolar disorder, current episode manic without psychotic features, unspecified: Secondary | ICD-10-CM | POA: Diagnosis not present

## 2020-09-06 DIAGNOSIS — F3110 Bipolar disorder, current episode manic without psychotic features, unspecified: Secondary | ICD-10-CM | POA: Diagnosis not present

## 2020-09-12 DIAGNOSIS — F3110 Bipolar disorder, current episode manic without psychotic features, unspecified: Secondary | ICD-10-CM | POA: Diagnosis not present

## 2020-09-13 DIAGNOSIS — F3110 Bipolar disorder, current episode manic without psychotic features, unspecified: Secondary | ICD-10-CM | POA: Diagnosis not present

## 2020-09-19 DIAGNOSIS — F3110 Bipolar disorder, current episode manic without psychotic features, unspecified: Secondary | ICD-10-CM | POA: Diagnosis not present

## 2020-09-20 DIAGNOSIS — F3110 Bipolar disorder, current episode manic without psychotic features, unspecified: Secondary | ICD-10-CM | POA: Diagnosis not present

## 2020-09-23 DIAGNOSIS — R6889 Other general symptoms and signs: Secondary | ICD-10-CM | POA: Diagnosis not present

## 2020-09-24 DIAGNOSIS — R6889 Other general symptoms and signs: Secondary | ICD-10-CM | POA: Diagnosis not present

## 2020-09-25 DIAGNOSIS — R6889 Other general symptoms and signs: Secondary | ICD-10-CM | POA: Diagnosis not present

## 2020-09-26 DIAGNOSIS — F3110 Bipolar disorder, current episode manic without psychotic features, unspecified: Secondary | ICD-10-CM | POA: Diagnosis not present

## 2020-09-26 DIAGNOSIS — R6889 Other general symptoms and signs: Secondary | ICD-10-CM | POA: Diagnosis not present

## 2020-09-27 DIAGNOSIS — F3110 Bipolar disorder, current episode manic without psychotic features, unspecified: Secondary | ICD-10-CM | POA: Diagnosis not present

## 2020-09-27 DIAGNOSIS — R6889 Other general symptoms and signs: Secondary | ICD-10-CM | POA: Diagnosis not present

## 2020-09-30 DIAGNOSIS — R6889 Other general symptoms and signs: Secondary | ICD-10-CM | POA: Diagnosis not present

## 2020-10-01 DIAGNOSIS — R6889 Other general symptoms and signs: Secondary | ICD-10-CM | POA: Diagnosis not present

## 2020-10-02 DIAGNOSIS — R6889 Other general symptoms and signs: Secondary | ICD-10-CM | POA: Diagnosis not present

## 2020-10-03 DIAGNOSIS — R6889 Other general symptoms and signs: Secondary | ICD-10-CM | POA: Diagnosis not present

## 2020-10-03 DIAGNOSIS — F3110 Bipolar disorder, current episode manic without psychotic features, unspecified: Secondary | ICD-10-CM | POA: Diagnosis not present

## 2020-10-04 DIAGNOSIS — F3110 Bipolar disorder, current episode manic without psychotic features, unspecified: Secondary | ICD-10-CM | POA: Diagnosis not present

## 2020-10-04 DIAGNOSIS — R6889 Other general symptoms and signs: Secondary | ICD-10-CM | POA: Diagnosis not present

## 2020-10-07 DIAGNOSIS — R6889 Other general symptoms and signs: Secondary | ICD-10-CM | POA: Diagnosis not present

## 2020-10-08 DIAGNOSIS — R6889 Other general symptoms and signs: Secondary | ICD-10-CM | POA: Diagnosis not present

## 2020-10-09 DIAGNOSIS — R6889 Other general symptoms and signs: Secondary | ICD-10-CM | POA: Diagnosis not present

## 2020-10-10 DIAGNOSIS — F3110 Bipolar disorder, current episode manic without psychotic features, unspecified: Secondary | ICD-10-CM | POA: Diagnosis not present

## 2020-10-10 DIAGNOSIS — R6889 Other general symptoms and signs: Secondary | ICD-10-CM | POA: Diagnosis not present

## 2020-10-11 DIAGNOSIS — F3110 Bipolar disorder, current episode manic without psychotic features, unspecified: Secondary | ICD-10-CM | POA: Diagnosis not present

## 2020-10-14 DIAGNOSIS — R6889 Other general symptoms and signs: Secondary | ICD-10-CM | POA: Diagnosis not present

## 2020-10-15 DIAGNOSIS — R6889 Other general symptoms and signs: Secondary | ICD-10-CM | POA: Diagnosis not present

## 2020-10-16 DIAGNOSIS — R6889 Other general symptoms and signs: Secondary | ICD-10-CM | POA: Diagnosis not present

## 2020-10-17 DIAGNOSIS — R6889 Other general symptoms and signs: Secondary | ICD-10-CM | POA: Diagnosis not present

## 2020-10-17 DIAGNOSIS — F3110 Bipolar disorder, current episode manic without psychotic features, unspecified: Secondary | ICD-10-CM | POA: Diagnosis not present

## 2020-10-18 DIAGNOSIS — F3110 Bipolar disorder, current episode manic without psychotic features, unspecified: Secondary | ICD-10-CM | POA: Diagnosis not present

## 2020-10-21 DIAGNOSIS — R6889 Other general symptoms and signs: Secondary | ICD-10-CM | POA: Diagnosis not present

## 2020-10-22 DIAGNOSIS — R6889 Other general symptoms and signs: Secondary | ICD-10-CM | POA: Diagnosis not present

## 2020-10-23 DIAGNOSIS — R6889 Other general symptoms and signs: Secondary | ICD-10-CM | POA: Diagnosis not present

## 2020-10-24 DIAGNOSIS — F3110 Bipolar disorder, current episode manic without psychotic features, unspecified: Secondary | ICD-10-CM | POA: Diagnosis not present

## 2020-10-24 DIAGNOSIS — R6889 Other general symptoms and signs: Secondary | ICD-10-CM | POA: Diagnosis not present

## 2020-10-25 DIAGNOSIS — R6889 Other general symptoms and signs: Secondary | ICD-10-CM | POA: Diagnosis not present

## 2020-10-25 DIAGNOSIS — F3110 Bipolar disorder, current episode manic without psychotic features, unspecified: Secondary | ICD-10-CM | POA: Diagnosis not present

## 2020-10-28 DIAGNOSIS — R6889 Other general symptoms and signs: Secondary | ICD-10-CM | POA: Diagnosis not present

## 2020-10-29 DIAGNOSIS — R6889 Other general symptoms and signs: Secondary | ICD-10-CM | POA: Diagnosis not present

## 2020-10-30 DIAGNOSIS — R6889 Other general symptoms and signs: Secondary | ICD-10-CM | POA: Diagnosis not present

## 2020-10-31 DIAGNOSIS — R6889 Other general symptoms and signs: Secondary | ICD-10-CM | POA: Diagnosis not present

## 2020-10-31 DIAGNOSIS — F3110 Bipolar disorder, current episode manic without psychotic features, unspecified: Secondary | ICD-10-CM | POA: Diagnosis not present

## 2020-11-01 DIAGNOSIS — R6889 Other general symptoms and signs: Secondary | ICD-10-CM | POA: Diagnosis not present

## 2020-11-01 DIAGNOSIS — F3110 Bipolar disorder, current episode manic without psychotic features, unspecified: Secondary | ICD-10-CM | POA: Diagnosis not present

## 2020-11-04 DIAGNOSIS — R6889 Other general symptoms and signs: Secondary | ICD-10-CM | POA: Diagnosis not present

## 2020-11-05 DIAGNOSIS — R6889 Other general symptoms and signs: Secondary | ICD-10-CM | POA: Diagnosis not present

## 2020-11-06 DIAGNOSIS — F3110 Bipolar disorder, current episode manic without psychotic features, unspecified: Secondary | ICD-10-CM | POA: Diagnosis not present

## 2020-11-06 DIAGNOSIS — R6889 Other general symptoms and signs: Secondary | ICD-10-CM | POA: Diagnosis not present

## 2020-11-07 DIAGNOSIS — F3110 Bipolar disorder, current episode manic without psychotic features, unspecified: Secondary | ICD-10-CM | POA: Diagnosis not present

## 2020-11-07 DIAGNOSIS — R6889 Other general symptoms and signs: Secondary | ICD-10-CM | POA: Diagnosis not present

## 2020-11-08 DIAGNOSIS — R6889 Other general symptoms and signs: Secondary | ICD-10-CM | POA: Diagnosis not present

## 2020-11-11 DIAGNOSIS — R6889 Other general symptoms and signs: Secondary | ICD-10-CM | POA: Diagnosis not present

## 2020-11-12 DIAGNOSIS — R6889 Other general symptoms and signs: Secondary | ICD-10-CM | POA: Diagnosis not present

## 2020-11-13 DIAGNOSIS — R6889 Other general symptoms and signs: Secondary | ICD-10-CM | POA: Diagnosis not present

## 2020-11-14 DIAGNOSIS — F3110 Bipolar disorder, current episode manic without psychotic features, unspecified: Secondary | ICD-10-CM | POA: Diagnosis not present

## 2020-11-14 DIAGNOSIS — R6889 Other general symptoms and signs: Secondary | ICD-10-CM | POA: Diagnosis not present

## 2020-11-15 DIAGNOSIS — F3110 Bipolar disorder, current episode manic without psychotic features, unspecified: Secondary | ICD-10-CM | POA: Diagnosis not present

## 2020-11-15 DIAGNOSIS — R6889 Other general symptoms and signs: Secondary | ICD-10-CM | POA: Diagnosis not present

## 2020-11-18 DIAGNOSIS — R6889 Other general symptoms and signs: Secondary | ICD-10-CM | POA: Diagnosis not present

## 2020-11-19 DIAGNOSIS — R6889 Other general symptoms and signs: Secondary | ICD-10-CM | POA: Diagnosis not present

## 2020-11-20 DIAGNOSIS — F3110 Bipolar disorder, current episode manic without psychotic features, unspecified: Secondary | ICD-10-CM | POA: Diagnosis not present

## 2020-11-20 DIAGNOSIS — R6889 Other general symptoms and signs: Secondary | ICD-10-CM | POA: Diagnosis not present

## 2020-11-21 DIAGNOSIS — R6889 Other general symptoms and signs: Secondary | ICD-10-CM | POA: Diagnosis not present

## 2020-11-22 DIAGNOSIS — R6889 Other general symptoms and signs: Secondary | ICD-10-CM | POA: Diagnosis not present

## 2020-11-22 DIAGNOSIS — F3110 Bipolar disorder, current episode manic without psychotic features, unspecified: Secondary | ICD-10-CM | POA: Diagnosis not present

## 2020-11-25 DIAGNOSIS — R6889 Other general symptoms and signs: Secondary | ICD-10-CM | POA: Diagnosis not present

## 2020-11-26 DIAGNOSIS — R6889 Other general symptoms and signs: Secondary | ICD-10-CM | POA: Diagnosis not present

## 2020-11-27 DIAGNOSIS — R6889 Other general symptoms and signs: Secondary | ICD-10-CM | POA: Diagnosis not present

## 2020-11-28 DIAGNOSIS — R6889 Other general symptoms and signs: Secondary | ICD-10-CM | POA: Diagnosis not present

## 2020-11-28 DIAGNOSIS — F3110 Bipolar disorder, current episode manic without psychotic features, unspecified: Secondary | ICD-10-CM | POA: Diagnosis not present

## 2020-11-29 DIAGNOSIS — F3110 Bipolar disorder, current episode manic without psychotic features, unspecified: Secondary | ICD-10-CM | POA: Diagnosis not present

## 2020-11-29 DIAGNOSIS — R6889 Other general symptoms and signs: Secondary | ICD-10-CM | POA: Diagnosis not present

## 2020-12-02 DIAGNOSIS — R6889 Other general symptoms and signs: Secondary | ICD-10-CM | POA: Diagnosis not present

## 2020-12-03 DIAGNOSIS — R6889 Other general symptoms and signs: Secondary | ICD-10-CM | POA: Diagnosis not present

## 2020-12-04 DIAGNOSIS — R6889 Other general symptoms and signs: Secondary | ICD-10-CM | POA: Diagnosis not present

## 2020-12-05 DIAGNOSIS — F3110 Bipolar disorder, current episode manic without psychotic features, unspecified: Secondary | ICD-10-CM | POA: Diagnosis not present

## 2020-12-05 DIAGNOSIS — R6889 Other general symptoms and signs: Secondary | ICD-10-CM | POA: Diagnosis not present

## 2020-12-06 DIAGNOSIS — F3110 Bipolar disorder, current episode manic without psychotic features, unspecified: Secondary | ICD-10-CM | POA: Diagnosis not present

## 2020-12-09 DIAGNOSIS — R6889 Other general symptoms and signs: Secondary | ICD-10-CM | POA: Diagnosis not present

## 2020-12-10 DIAGNOSIS — F3110 Bipolar disorder, current episode manic without psychotic features, unspecified: Secondary | ICD-10-CM | POA: Diagnosis not present

## 2020-12-10 DIAGNOSIS — R6889 Other general symptoms and signs: Secondary | ICD-10-CM | POA: Diagnosis not present

## 2020-12-11 DIAGNOSIS — F3110 Bipolar disorder, current episode manic without psychotic features, unspecified: Secondary | ICD-10-CM | POA: Diagnosis not present

## 2020-12-11 DIAGNOSIS — R6889 Other general symptoms and signs: Secondary | ICD-10-CM | POA: Diagnosis not present

## 2020-12-12 DIAGNOSIS — R6889 Other general symptoms and signs: Secondary | ICD-10-CM | POA: Diagnosis not present

## 2020-12-16 DIAGNOSIS — R6889 Other general symptoms and signs: Secondary | ICD-10-CM | POA: Diagnosis not present

## 2020-12-17 DIAGNOSIS — R6889 Other general symptoms and signs: Secondary | ICD-10-CM | POA: Diagnosis not present

## 2020-12-19 DIAGNOSIS — R6889 Other general symptoms and signs: Secondary | ICD-10-CM | POA: Diagnosis not present

## 2020-12-19 DIAGNOSIS — F3110 Bipolar disorder, current episode manic without psychotic features, unspecified: Secondary | ICD-10-CM | POA: Diagnosis not present

## 2020-12-20 DIAGNOSIS — R6889 Other general symptoms and signs: Secondary | ICD-10-CM | POA: Diagnosis not present

## 2020-12-20 DIAGNOSIS — F3110 Bipolar disorder, current episode manic without psychotic features, unspecified: Secondary | ICD-10-CM | POA: Diagnosis not present

## 2020-12-23 DIAGNOSIS — R6889 Other general symptoms and signs: Secondary | ICD-10-CM | POA: Diagnosis not present

## 2020-12-24 DIAGNOSIS — R6889 Other general symptoms and signs: Secondary | ICD-10-CM | POA: Diagnosis not present

## 2020-12-25 DIAGNOSIS — R6889 Other general symptoms and signs: Secondary | ICD-10-CM | POA: Diagnosis not present

## 2020-12-26 DIAGNOSIS — R6889 Other general symptoms and signs: Secondary | ICD-10-CM | POA: Diagnosis not present

## 2020-12-26 DIAGNOSIS — F3110 Bipolar disorder, current episode manic without psychotic features, unspecified: Secondary | ICD-10-CM | POA: Diagnosis not present

## 2020-12-27 DIAGNOSIS — F3110 Bipolar disorder, current episode manic without psychotic features, unspecified: Secondary | ICD-10-CM | POA: Diagnosis not present

## 2020-12-27 DIAGNOSIS — R6889 Other general symptoms and signs: Secondary | ICD-10-CM | POA: Diagnosis not present

## 2020-12-30 DIAGNOSIS — R6889 Other general symptoms and signs: Secondary | ICD-10-CM | POA: Diagnosis not present

## 2020-12-31 DIAGNOSIS — R6889 Other general symptoms and signs: Secondary | ICD-10-CM | POA: Diagnosis not present

## 2021-01-01 DIAGNOSIS — R6889 Other general symptoms and signs: Secondary | ICD-10-CM | POA: Diagnosis not present

## 2021-01-02 DIAGNOSIS — R6889 Other general symptoms and signs: Secondary | ICD-10-CM | POA: Diagnosis not present

## 2021-01-06 DIAGNOSIS — R6889 Other general symptoms and signs: Secondary | ICD-10-CM | POA: Diagnosis not present

## 2021-01-07 DIAGNOSIS — R6889 Other general symptoms and signs: Secondary | ICD-10-CM | POA: Diagnosis not present

## 2021-01-08 DIAGNOSIS — R6889 Other general symptoms and signs: Secondary | ICD-10-CM | POA: Diagnosis not present

## 2021-01-09 DIAGNOSIS — R6889 Other general symptoms and signs: Secondary | ICD-10-CM | POA: Diagnosis not present

## 2021-01-10 DIAGNOSIS — R6889 Other general symptoms and signs: Secondary | ICD-10-CM | POA: Diagnosis not present

## 2021-01-13 DIAGNOSIS — R6889 Other general symptoms and signs: Secondary | ICD-10-CM | POA: Diagnosis not present

## 2021-01-14 DIAGNOSIS — R6889 Other general symptoms and signs: Secondary | ICD-10-CM | POA: Diagnosis not present

## 2021-01-15 DIAGNOSIS — M069 Rheumatoid arthritis, unspecified: Secondary | ICD-10-CM | POA: Diagnosis not present

## 2021-01-15 DIAGNOSIS — F3110 Bipolar disorder, current episode manic without psychotic features, unspecified: Secondary | ICD-10-CM | POA: Diagnosis not present

## 2021-01-16 DIAGNOSIS — M069 Rheumatoid arthritis, unspecified: Secondary | ICD-10-CM | POA: Diagnosis not present

## 2021-01-16 DIAGNOSIS — F3110 Bipolar disorder, current episode manic without psychotic features, unspecified: Secondary | ICD-10-CM | POA: Diagnosis not present

## 2021-01-17 DIAGNOSIS — M069 Rheumatoid arthritis, unspecified: Secondary | ICD-10-CM | POA: Diagnosis not present

## 2021-01-20 ENCOUNTER — Other Ambulatory Visit: Payer: Self-pay | Admitting: Family Medicine

## 2021-01-20 DIAGNOSIS — K860 Alcohol-induced chronic pancreatitis: Secondary | ICD-10-CM

## 2021-01-20 DIAGNOSIS — I10 Essential (primary) hypertension: Secondary | ICD-10-CM

## 2021-01-20 DIAGNOSIS — M069 Rheumatoid arthritis, unspecified: Secondary | ICD-10-CM | POA: Diagnosis not present

## 2021-01-20 DIAGNOSIS — K219 Gastro-esophageal reflux disease without esophagitis: Secondary | ICD-10-CM

## 2021-01-20 DIAGNOSIS — J302 Other seasonal allergic rhinitis: Secondary | ICD-10-CM

## 2021-01-20 NOTE — Telephone Encounter (Signed)
Medication Refill - Medication: amLODipine (NORVASC) 10 MG tablet  famotidine (PEPCID) 20 MG tablet   fluticasone (FLONASE) 50 MCG/ACT nasal spray   Pancrelipase, Lip-Prot-Amyl, (ZENPEP) 15000-47000 units CPEP Pt asked if he can have a courtesy refill due to being out of medication/ Pt has scheduled an appt for this Thursday/Pt is asking for refill to be sent today / please advise    Has the patient contacted their pharmacy? Yes.   (Agent: If no, request that the patient contact the pharmacy for the refill.) (Agent: If yes, when and what did the pharmacy advise?)request sent on 6.4.22  Preferred Pharmacy (with phone number or street name): [id number] [place], [place] [address]  [address], [place] [address]  Phone:  [phone] Fax:  [phone]   Agent: Please be advised that RX refills may take up to 3 business days. We ask that you follow-up with your pharmacy.

## 2021-01-20 NOTE — Telephone Encounter (Signed)
Notes to clinic: Patient has appointment on 01/23/2021 and would like to have mediation refill today is possible Patient is out of medication    Requested Prescriptions  Pending Prescriptions Disp Refills   amLODipine (NORVASC) 10 MG tablet 90 tablet 1    Sig: Take 1 tablet (10 mg total) by mouth daily.      Cardiovascular:  Calcium Channel Blockers Failed - 01/20/2021 10:14 AM      Failed - Valid encounter within last 6 months    Recent Outpatient Visits           7 months ago Essential hypertension   [facility], Lupita Raider, FNP   10 months ago Left testicular pain   [place], FNP   1 year ago Essential hypertension   [place], FNP   1 year ago Ganglion cyst of wrist, left   [facility] Merrilyn Puma, Jerrel Ivory, NP   1 year ago PTSD (post-traumatic stress disorder)   [facility] Mikey College, NP       Future Appointments             In 3 days Jearld Fenton, NP [facility] [place] BP in normal range    BP Readings from Last 1 Encounters:  06/06/20 121/80            famotidine (PEPCID) 20 MG tablet 180 tablet 1    Sig: Take 1 tablet (20 mg total) by mouth 2 (two) times daily.      Gastroenterology:  H2 Antagonists Passed - 01/20/2021 10:14 AM      Passed - Valid encounter within last 12 months    Recent Outpatient Visits           7 months ago Essential hypertension   [facility], Lupita Raider, FNP   10 months ago Left testicular pain   [facility], Lupita Raider, FNP   1 year ago Essential hypertension   [facility], Lupita Raider, FNP   1 year ago Ganglion cyst of wrist, left   [facility] Merrilyn Puma, Jerrel Ivory, NP   1 year ago PTSD (post-traumatic stress disorder)   [facility] Mikey College, NP        Future Appointments             In 3 days [place], Coralie Keens, NP [facility]               Pancrelipase, Lip-Prot-Amyl, (ZENPEP) 15000-47000 units CPEP 270 capsule 1    Sig: TAKE 1 CAPSULE BY MOUTH 3 (THREE) TIMES DAILY WITH MEALS.      Off-Protocol Failed - 01/20/2021 10:14 AM      Failed - Medication not assigned to a protocol, review manually.      Passed - Valid encounter within last 12 months    Recent Outpatient Visits           7 months ago Essential hypertension   [facility], Lupita Raider, FNP   10 months ago Left testicular pain   [place], FNP   1 year ago Essential hypertension   [facility], Lupita Raider, FNP   1 year  ago Ganglion cyst of wrist, left   [facility] Merrilyn Puma, Jerrel Ivory, NP   1 year ago PTSD (post-traumatic stress disorder)   [facility] Mikey College, NP       Future Appointments             In 3 days [place], Coralie Keens, NP [facility]               fluticasone [facility]) 50 MCG/ACT nasal spray 16 mL 1    Sig: Place 1 spray into both nostrils daily as needed for allergies or rhinitis.      Ear, Nose, and Throat: Nasal Preparations - Corticosteroids Passed - 01/20/2021 10:14 AM      Passed - Valid encounter within last 12 months    Recent Outpatient Visits           7 months ago Essential hypertension   [facility], Lupita Raider, FNP   10 months ago Left testicular pain   [place], FNP   1 year ago Essential hypertension   [place], FNP   1 year ago Ganglion cyst of wrist, left   [facility] Merrilyn Puma, Jerrel Ivory, NP   1 year ago PTSD (post-traumatic stress disorder)   [facility] Mikey College, NP       Future Appointments             In 3  days Baity, Coralie Keens, NP [facility]

## 2021-01-21 DIAGNOSIS — M069 Rheumatoid arthritis, unspecified: Secondary | ICD-10-CM | POA: Diagnosis not present

## 2021-01-22 ENCOUNTER — Other Ambulatory Visit: Payer: Self-pay

## 2021-01-22 DIAGNOSIS — M069 Rheumatoid arthritis, unspecified: Secondary | ICD-10-CM | POA: Diagnosis not present

## 2021-01-22 DIAGNOSIS — K219 Gastro-esophageal reflux disease without esophagitis: Secondary | ICD-10-CM

## 2021-01-22 DIAGNOSIS — I10 Essential (primary) hypertension: Secondary | ICD-10-CM

## 2021-01-22 DIAGNOSIS — J302 Other seasonal allergic rhinitis: Secondary | ICD-10-CM

## 2021-01-22 DIAGNOSIS — F3110 Bipolar disorder, current episode manic without psychotic features, unspecified: Secondary | ICD-10-CM | POA: Diagnosis not present

## 2021-01-22 MED ORDER — AMLODIPINE BESYLATE 10 MG PO TABS: 1.0000 | ORAL_TABLET | Freq: Every day | ORAL | 1 refills | Status: DC

## 2021-01-22 MED ORDER — LOSARTAN POTASSIUM 100 MG PO TABS: 1.0000 | ORAL_TABLET | Freq: Every day | ORAL | 1 refills | Status: DC

## 2021-01-22 MED ORDER — FLUTICASONE PROPIONATE 50 MCG/ACT NA SUSP
1.0000 | Freq: Every day | NASAL | 1 refills | Status: DC | PRN
Start: 2021-01-22 — End: 2021-06-11

## 2021-01-22 MED ORDER — LORATADINE 10 MG PO TABS: 10.0000 mg | ORAL_TABLET | Freq: Every day | ORAL | 3 refills | Status: DC

## 2021-01-22 MED ORDER — FAMOTIDINE 20 MG PO TABS: 20.0000 mg | ORAL_TABLET | Freq: Two times a day (BID) | ORAL | 1 refills | Status: DC

## 2021-01-23 ENCOUNTER — Encounter: Payer: Self-pay | Admitting: Internal Medicine

## 2021-01-23 ENCOUNTER — Ambulatory Visit: Payer: Medicaid Other | Admitting: Internal Medicine

## 2021-01-23 ENCOUNTER — Other Ambulatory Visit: Payer: Self-pay

## 2021-01-23 DIAGNOSIS — N522 Drug-induced erectile dysfunction: Secondary | ICD-10-CM

## 2021-01-23 DIAGNOSIS — D508 Other iron deficiency anemias: Secondary | ICD-10-CM

## 2021-01-23 DIAGNOSIS — I10 Essential (primary) hypertension: Secondary | ICD-10-CM

## 2021-01-23 DIAGNOSIS — F4310 Post-traumatic stress disorder, unspecified: Secondary | ICD-10-CM | POA: Diagnosis not present

## 2021-01-23 DIAGNOSIS — F3342 Major depressive disorder, recurrent, in full remission: Secondary | ICD-10-CM

## 2021-01-23 DIAGNOSIS — F1021 Alcohol dependence, in remission: Secondary | ICD-10-CM

## 2021-01-23 DIAGNOSIS — F5105 Insomnia due to other mental disorder: Secondary | ICD-10-CM

## 2021-01-23 DIAGNOSIS — M069 Rheumatoid arthritis, unspecified: Secondary | ICD-10-CM | POA: Diagnosis not present

## 2021-01-23 DIAGNOSIS — E7800 Pure hypercholesterolemia, unspecified: Secondary | ICD-10-CM

## 2021-01-23 DIAGNOSIS — K219 Gastro-esophageal reflux disease without esophagitis: Secondary | ICD-10-CM

## 2021-01-23 DIAGNOSIS — F3110 Bipolar disorder, current episode manic without psychotic features, unspecified: Secondary | ICD-10-CM | POA: Diagnosis not present

## 2021-01-23 DIAGNOSIS — K860 Alcohol-induced chronic pancreatitis: Secondary | ICD-10-CM | POA: Diagnosis not present

## 2021-01-23 MED ORDER — ZENPEP 15000-47000 UNITS PO CPEP: ORAL_CAPSULE | ORAL | 1 refills | Status: DC

## 2021-01-23 MED ORDER — GABAPENTIN 100 MG PO CAPS: 100.0000 mg | ORAL_CAPSULE | Freq: Every evening | ORAL | 1 refills | Status: DC | PRN

## 2021-01-23 MED ORDER — BUPROPION HCL ER (XL) 150 MG PO TB24: 150.0000 mg | ORAL_TABLET | Freq: Every day | ORAL | 1 refills | Status: DC

## 2021-01-23 MED ORDER — NAPROXEN 500 MG PO TABS: 500.0000 mg | ORAL_TABLET | Freq: Two times a day (BID) | ORAL | 1 refills | Status: DC | PRN

## 2021-01-23 NOTE — Progress Notes (Signed)
Subjective:    Patient ID: Noah Velasquez, male    DOB: [birth year], 55 y.o.   MRN: [id number]  HPI  Patient presents the clinic today for follow-up of chronic conditions.  He is establishing care with me today, transferring care from [facility], NP.  HTN: His BP today is 150/86.  He has been out of Losartan and Amlodipine x 1 week.  HLD: His last LDL was 88, triglycerides 59, 05/2020. He is not taking any cholesterol lowering medication at this time. He does not consume a low fat diet.  OA: Mainly in his hands, knee and feet. He takes Tylenol or Goodies powders OTC with some relief. He would like a refill on Gabapentin today.  Anemia: His last H/H was 14.1/41.7, 05/2020. He is not currently taking an oral iron supplement.  ED: He is not taking Sildenfail because insurance would not cover it.  Depression/PTSD: Managed on Sertraline. He ran out of Wellbutrin a few months ago but would like to get restarted on this. He is not currently seeing a therapist, but he would like to see one. He denies SI/HI.  GERD: Triggered by greasy and spicy food. He denies breakthrough on Famotidine. There is no upper GI on file.  Insomnia: He has difficulty staying asleep. He takes Trazadone as prescribed with good results. There is no sleep study on file.    Alcohol Abuse with a History of Pancreatitis: He reports he has been out of pancreatic enzymes x 1 month. He reports intermittent abdominal pain and loose stools. He does not follow with GI. He drinks 5-6 beers per week.  He also reports eft eye protruding, watering, and blurry vision. This has been a chronic issue for him. He has had a MRI brain and seen opthalmology at [facility] for the same. Diagnosis was IG4 diseased. He reports he has not received any treatment for this.  Review of Systems     Past Medical History:  Diagnosis Date   Chronic hip pain    Chronic pancreatitis (HCC)    CKD (chronic kidney disease) stage 3, GFR 30-59 ml/min (HCC)     GERD (gastroesophageal reflux disease)    Hyperlipidemia    Hypertension    Osteoarthritis    Pancreatitis    Pancreatitis    PTSD (post-traumatic stress disorder)    Viral infection of left eye     Current Outpatient Medications  Medication Sig Dispense Refill   amLODipine (NORVASC) 10 MG tablet Take 1 tablet (10 mg total) by mouth daily. 90 tablet 1   Blood Pressure KIT Please allow patient to chose from machines that are covered under insurance 1 kit 0   buPROPion (WELLBUTRIN XL) 150 MG 24 hr tablet Take 1 tablet (150 mg total) by mouth daily. 90 tablet 1   famotidine (PEPCID) 20 MG tablet Take 1 tablet (20 mg total) by mouth 2 (two) times daily. 180 tablet 1   fluticasone (FLONASE) 50 MCG/ACT nasal spray Place 1 spray into both nostrils daily as needed for allergies or rhinitis. 16 mL 1   gabapentin (NEURONTIN) 100 MG capsule Take 1 capsule (100 mg total) by mouth at bedtime as needed (sleep). 90 capsule 1   Lactobacillus (PROBIOTIC ACIDOPHILUS) CAPS Take 1 capsule by mouth daily. (Patient not taking: Reported on 06/06/2020) 30 capsule 0   loratadine (CLARITIN) 10 MG tablet Take 1 tablet (10 mg total) by mouth daily. 30 tablet 3   losartan (COZAAR) 100 MG tablet Take 1 tablet (100 mg total) by  mouth daily. 90 tablet 1   naproxen (NAPROSYN) 500 MG tablet TAKE 1 TABLET (500 MG TOTAL) BY MOUTH 2 (TWO) TIMES DAILY AS NEEDED FOR MILD PAIN OR MODERATE PAIN. 20 tablet 0   nicotine (NICODERM CQ - DOSED IN MG/24 HOURS) 14 mg/24hr patch Place 1 patch (14 mg total) onto the skin daily. 14 patch 0   Pancrelipase, Lip-Prot-Amyl, (ZENPEP) 68127-51700 units CPEP TAKE 1 CAPSULE BY MOUTH 3 (THREE) TIMES DAILY WITH MEALS. 270 capsule 1   sertraline (ZOLOFT) 100 MG tablet TAKE 1 TABLET BY MOUTH EVERY DAY 90 tablet 1   sildenafil (REVATIO) 20 MG tablet Take 1 tablet (20 mg total) by mouth daily as needed. 10 tablet 0   traZODone (DESYREL) 150 MG tablet Take 1 tablet (150 mg total) by mouth at bedtime as  needed for sleep. 30 tablet 5   No current facility-administered medications for this visit.    Allergies  Allergen Reactions   Hctz [Hydrochlorothiazide] Swelling    Family History  Problem Relation Age of Onset   Diabetes Mother    Sleep apnea Mother    Emphysema Sister    CAD Neg Hx    Mental illness Neg Hx     Social History   Socioeconomic History   Marital status: Legally Separated    Spouse name: Not on file   Number of children: 2   Years of education: Not on file   Highest education level: High school graduate  Occupational History   Occupation: on disability  Tobacco Use   Smoking status: Every Day    Packs/day: 0.25    Years: 15.00    Pack years: 3.75    Types: Cigarettes   Smokeless tobacco: Never  Vaping Use   Vaping Use: Never used  Substance and Sexual Activity   Alcohol use: Yes    Alcohol/week: 7.0 standard drinks    Types: 7 Cans of beer per week    Comment: weekly   Drug use: Yes    Types: Marijuana   Sexual activity: Yes    Birth control/protection: Condom  Other Topics Concern   Not on file  Social History Narrative   Not on file   Social Determinants of Health   Financial Resource Strain: Not on file  Food Insecurity: Not on file  Transportation Needs: Not on file  Physical Activity: Not on file  Stress: Not on file  Social Connections: Not on file  Intimate Partner Violence: Not on file     Constitutional: Denies fever, malaise, fatigue, headache or abrupt weight changes.  HEENT: Pt reports protuberance of left eye with vision changes. Denies eye pain, eye redness, ear pain, ringing in the ears, wax buildup, runny nose, nasal congestion, bloody nose, or sore throat. Respiratory: Denies difficulty breathing, shortness of breath, cough or sputum production.   Cardiovascular: Denies chest pain, chest tightness, palpitations or swelling in the hands or feet.  Gastrointestinal: Pt reports intermittent abdominal pain and loose  stools. Denies bloating, constipation, or blood in the stool.  GU: Pt reports erectile dysfunction. Denies urgency, frequency, pain with urination, burning sensation, blood in urine, odor or discharge. Musculoskeletal: Pt reports chronic joint pain, difficulty with gait. Denies decrease in range of motion, muscle pain or joint swelling.  Skin: Denies redness, rashes, lesions or ulcercations.  Neurological: Pt reports insomnia. Denies dizziness, difficulty with memory, difficulty with speech or problems with balance and coordination.  Psych: Pt has a history of anxiety and depression. Denies SI/HI.  No other  specific complaints in a complete review of systems (except as listed in HPI above).  Objective:   Physical Exam  BP (!) 150/86 (BP Location: Right Arm, Patient Position: Sitting, Cuff Size: Large)   Pulse 70   Temp (!) 97.1 F (36.2 C) (Temporal)   Resp 18   Ht '5\' 7"'  (1.702 m)   Wt 78.1 kg   SpO2 97%   BMI 26.97 kg/m   Wt Readings from Last 3 Encounters:  06/06/20 179 lb 3.2 oz (81.3 kg)  03/07/20 174 lb (78.9 kg)  03/06/20 178 lb 3.2 oz (80.8 kg)    General: Appears his stated age, chronically ill appearing, in NAD. Skin: Warm, dry and intact. Ganglion cyst noted of left wrist. HEENT: Head: normal shape and size; Eyes: sclera white and EOMs intact, bilateral exophthalmus noted; Neck:  Neck supple, trachea midline. No masses, lumps or thyromegaly present.  Cardiovascular: Normal rate and rhythm. S1,S2 noted.  No murmur, rubs or gallops noted. No JVD or BLE edema. No carotid bruits noted. Pulmonary/Chest: Normal effort and positive vesicular breath sounds. No respiratory distress. No wheezes, rales or ronchi noted.  Abdomen: Soft and nontender. Normal bowel sounds. No distention or masses noted.  Musculoskeletal: In motorized wheelchair. Neurological: Alert and oriented.  Psychiatric: Mood and affect normal. Behavior is normal. Judgment and thought content normal.   BMET     Component Value Date/Time   NA 134 (L) 06/06/2020 1141   K 4.1 06/06/2020 1141   CL 103 06/06/2020 1141   CO2 23 06/06/2020 1141   GLUCOSE 99 06/06/2020 1141   BUN 11 06/06/2020 1141   CREATININE 1.26 06/06/2020 1141   CALCIUM 9.1 06/06/2020 1141   GFRNONAA 64 06/06/2020 1141   GFRAA 74 06/06/2020 1141    Lipid Panel     Component Value Date/Time   CHOL 150 06/06/2020 1141   TRIG 59 06/06/2020 1141   HDL 48 06/06/2020 1141   CHOLHDL 3.1 06/06/2020 1141   VLDL 18 06/12/2018 1015   LDLCALC 88 06/06/2020 1141    CBC    Component Value Date/Time   WBC 7.8 06/06/2020 1141   RBC 4.40 06/06/2020 1141   HGB 14.1 06/06/2020 1141   HCT 41.7 06/06/2020 1141   PLT 307 06/06/2020 1141   MCV 94.8 06/06/2020 1141   MCH 32.0 06/06/2020 1141   MCHC 33.8 06/06/2020 1141   RDW 12.3 06/06/2020 1141   LYMPHSABS 2,075 06/06/2020 1141   MONOABS 0.8 12/13/2019 1028   EOSABS 476 06/06/2020 1141   BASOSABS 70 06/06/2020 1141    Hgb A1C Lab Results  Component Value Date   HGBA1C 5.2 01/10/2020            Assessment & Plan:   Ganglion Cyst, Left Wrist:  Advised him no intervention was needed unless he develops pain, numbness or tingling Will monitor  RTC in 6 months for your annual exam  Webb Silversmith, NP This visit occurred during the SARS-CoV-2 public health emergency.  Safety protocols were in place, including screening questions prior to the visit, additional usage of staff PPE, and extensive cleaning of exam room while observing appropriate contact time as indicated for disinfecting solutions.

## 2021-01-24 DIAGNOSIS — M069 Rheumatoid arthritis, unspecified: Secondary | ICD-10-CM | POA: Diagnosis not present

## 2021-01-26 DIAGNOSIS — E1169 Type 2 diabetes mellitus with other specified complication: Secondary | ICD-10-CM | POA: Insufficient documentation

## 2021-01-26 DIAGNOSIS — K861 Other chronic pancreatitis: Secondary | ICD-10-CM | POA: Insufficient documentation

## 2021-01-26 DIAGNOSIS — E785 Hyperlipidemia, unspecified: Secondary | ICD-10-CM | POA: Insufficient documentation

## 2021-01-26 DIAGNOSIS — K219 Gastro-esophageal reflux disease without esophagitis: Secondary | ICD-10-CM | POA: Insufficient documentation

## 2021-01-26 NOTE — Assessment & Plan Note (Signed)
We will check CBC with annual exam Not currently taking oral iron

## 2021-01-26 NOTE — Assessment & Plan Note (Signed)
Continue Sertraline, Wellbutrin sent to pharmacy Support offered

## 2021-01-26 NOTE — Assessment & Plan Note (Signed)
Amlodipine and Losartan refilled today Reinforced DASH diet and exercise for weight loss

## 2021-01-26 NOTE — Assessment & Plan Note (Signed)
Encourage abstinence from alcohol Zenpap refill today

## 2021-01-26 NOTE — Assessment & Plan Note (Signed)
Avoid foods that trigger reflux Encouraged weight loss as this can help reduce reflux symptoms Continue Famotidine

## 2021-01-26 NOTE — Assessment & Plan Note (Signed)
Not medicated 

## 2021-01-26 NOTE — Assessment & Plan Note (Signed)
Encourage abstinence

## 2021-01-26 NOTE — Assessment & Plan Note (Signed)
Okay to continue Tylenol OTC Avoid NSAIDs

## 2021-01-26 NOTE — Patient Instructions (Signed)
https://www.nhlbi.nih.gov/files/docs/public/heart/dash_brief.pdf">  DASH Eating Plan DASH stands for Dietary Approaches to Stop Hypertension. The DASH eating plan is a healthy eating plan that has been shown to: Reduce high blood pressure (hypertension). Reduce your risk for type 2 diabetes, heart disease, and stroke. Help with weight loss. What are tips for following this plan? Reading food labels Check food labels for the amount of salt (sodium) per serving. Choose foods with less than 5 percent of the Daily Value of sodium. Generally, foods with less than 300 milligrams (mg) of sodium per serving fit into this eating plan. To find whole grains, look for the word "whole" as the first word in the ingredient list. Shopping Buy products labeled as "low-sodium" or "no salt added." Buy fresh foods. Avoid canned foods and pre-made or frozen meals. Cooking Avoid adding salt when cooking. Use salt-free seasonings or herbs instead of table salt or sea salt. Check with your health care provider or pharmacist before using salt substitutes. Do not fry foods. Cook foods using healthy methods such as baking, boiling, grilling, roasting, and broiling instead. Cook with heart-healthy oils, such as olive, canola, avocado, soybean, or sunflower oil. Meal planning  Eat a balanced diet that includes: 4 or more servings of fruits and 4 or more servings of vegetables each day. Try to fill one-half of your plate with fruits and vegetables. 6-8 servings of whole grains each day. Less than 6 oz (170 g) of lean meat, poultry, or fish each day. A 3-oz (85-g) serving of meat is about the same size as a deck of cards. One egg equals 1 oz (28 g). 2-3 servings of low-fat dairy each day. One serving is 1 cup (237 mL). 1 serving of nuts, seeds, or beans 5 times each week. 2-3 servings of heart-healthy fats. Healthy fats called omega-3 fatty acids are found in foods such as walnuts, flaxseeds, fortified milks, and eggs.  These fats are also found in cold-water fish, such as sardines, salmon, and mackerel. Limit how much you eat of: Canned or prepackaged foods. Food that is high in trans fat, such as some fried foods. Food that is high in saturated fat, such as fatty meat. Desserts and other sweets, sugary drinks, and other foods with added sugar. Full-fat dairy products. Do not salt foods before eating. Do not eat more than 4 egg yolks a week. Try to eat at least 2 vegetarian meals a week. Eat more home-cooked food and less restaurant, buffet, and fast food.  Lifestyle When eating at a restaurant, ask that your food be prepared with less salt or no salt, if possible. If you drink alcohol: Limit how much you use to: 0-1 drink a day for women who are not pregnant. 0-2 drinks a day for men. Be aware of how much alcohol is in your drink. In the [place] one drink equals one 12 oz bottle of beer (355 mL), one 5 oz glass of wine (148 mL), or one 1 oz glass of hard liquor (44 mL). General information Avoid eating more than 2,300 mg of salt a day. If you have hypertension, you may need to reduce your sodium intake to 1,500 mg a day. Work with your health care provider to maintain a healthy body weight or to lose weight. Ask what an ideal weight is for you. Get at least 30 minutes of exercise that causes your heart to beat faster (aerobic exercise) most days of the week. Activities may include walking, swimming, or biking. Work with your health care provider   or dietitian to adjust your eating plan to your individual calorie needs. What foods should I eat? Fruits All fresh, dried, or frozen fruit. Canned fruit in natural juice (without addedsugar). Vegetables Fresh or frozen vegetables (raw, steamed, roasted, or grilled). Low-sodium or reduced-sodium tomato and vegetable juice. Low-sodium or reduced-sodium tomatosauce and tomato paste. Low-sodium or reduced-sodium canned vegetables. Grains Whole-grain or  whole-wheat bread. Whole-grain or whole-wheat pasta. Brown rice. Oatmeal. Quinoa. Bulgur. Whole-grain and low-sodium cereals. Pita bread.Low-fat, low-sodium crackers. Whole-wheat flour tortillas. Meats and other proteins Skinless chicken or turkey. Ground chicken or turkey. Pork with fat trimmed off. Fish and seafood. Egg whites. Dried beans, peas, or lentils. Unsalted nuts, nut butters, and seeds. Unsalted canned beans. Lean cuts of beef with fat trimmed off. Low-sodium, lean precooked or cured meat, such as sausages or meatloaves. Dairy Low-fat (1%) or fat-free (skim) milk. Reduced-fat, low-fat, or fat-free cheeses. Nonfat, low-sodium ricotta or cottage cheese. Low-fat or nonfatyogurt. Low-fat, low-sodium cheese. Fats and oils Soft margarine without trans fats. Vegetable oil. Reduced-fat, low-fat, or light mayonnaise and salad dressings (reduced-sodium). Canola, safflower, olive, avocado, soybean, andsunflower oils. Avocado. Seasonings and condiments Herbs. Spices. Seasoning mixes without salt. Other foods Unsalted popcorn and pretzels. Fat-free sweets. The items listed above may not be a complete list of foods and beverages you can eat. Contact a dietitian for more information. What foods should I avoid? Fruits Canned fruit in a light or heavy syrup. Fried fruit. Fruit in cream or buttersauce. Vegetables Creamed or fried vegetables. Vegetables in a cheese sauce. Regular canned vegetables (not low-sodium or reduced-sodium). Regular canned tomato sauce and paste (not low-sodium or reduced-sodium). Regular tomato and vegetable juice(not low-sodium or reduced-sodium). Pickles. Olives. Grains Baked goods made with fat, such as croissants, muffins, or some breads. Drypasta or rice meal packs. Meats and other proteins Fatty cuts of meat. Ribs. Fried meat. Bacon. Bologna, salami, and other precooked or cured meats, such as sausages or meat loaves. Fat from the back of a pig (fatback). Bratwurst.  Salted nuts and seeds. Canned beans with added salt. Canned orsmoked fish. Whole eggs or egg yolks. Chicken or turkey with skin. Dairy Whole or 2% milk, cream, and half-and-half. Whole or full-fat cream cheese. Whole-fat or sweetened yogurt. Full-fat cheese. Nondairy creamers. Whippedtoppings. Processed cheese and cheese spreads. Fats and oils Butter. Stick margarine. Lard. Shortening. Ghee. Bacon fat. Tropical oils, suchas coconut, palm kernel, or palm oil. Seasonings and condiments Onion salt, garlic salt, seasoned salt, table salt, and sea salt. Worcestershire sauce. Tartar sauce. Barbecue sauce. Teriyaki sauce. Soy sauce, including reduced-sodium. Steak sauce. Canned and packaged gravies. Fish sauce. Oyster sauce. Cocktail sauce. Store-bought horseradish. Ketchup. Mustard. Meat flavorings and tenderizers. Bouillon cubes. Hot sauces. Pre-made or packaged marinades. Pre-made or packaged taco seasonings. Relishes. Regular saladdressings. Other foods Salted popcorn and pretzels. The items listed above may not be a complete list of foods and beverages you should avoid. Contact a dietitian for more information. Where to find more information National Heart, Lung, and Blood Institute: www.nhlbi.nih.gov American Heart Association: www.heart.org Academy of Nutrition and Dietetics: www.eatright.org National Kidney Foundation: www.kidney.org Summary The DASH eating plan is a healthy eating plan that has been shown to reduce high blood pressure (hypertension). It may also reduce your risk for type 2 diabetes, heart disease, and stroke. When on the DASH eating plan, aim to eat more fresh fruits and vegetables, whole grains, lean proteins, low-fat dairy, and heart-healthy fats. With the DASH eating plan, you should limit salt (sodium) intake to 2,300   mg a day. If you have hypertension, you may need to reduce your sodium intake to 1,500 mg a day. Work with your health care provider or dietitian to adjust  your eating plan to your individual calorie needs. This information is not intended to replace advice given to you by your health care provider. Make sure you discuss any questions you have with your healthcare provider. Document Revised: 07/07/2019 Document Reviewed: 07/07/2019 Elsevier Patient Education  2022 Elsevier Inc.  

## 2021-01-26 NOTE — Assessment & Plan Note (Signed)
Stable on current dose of Trazodone

## 2021-01-26 NOTE — Assessment & Plan Note (Signed)
Encouraged him to consume a low-fat diet

## 2021-01-27 DIAGNOSIS — M069 Rheumatoid arthritis, unspecified: Secondary | ICD-10-CM | POA: Diagnosis not present

## 2021-01-28 DIAGNOSIS — M069 Rheumatoid arthritis, unspecified: Secondary | ICD-10-CM | POA: Diagnosis not present

## 2021-01-29 DIAGNOSIS — M069 Rheumatoid arthritis, unspecified: Secondary | ICD-10-CM | POA: Diagnosis not present

## 2021-01-29 DIAGNOSIS — F3110 Bipolar disorder, current episode manic without psychotic features, unspecified: Secondary | ICD-10-CM | POA: Diagnosis not present

## 2021-01-30 DIAGNOSIS — F3110 Bipolar disorder, current episode manic without psychotic features, unspecified: Secondary | ICD-10-CM | POA: Diagnosis not present

## 2021-01-30 DIAGNOSIS — M069 Rheumatoid arthritis, unspecified: Secondary | ICD-10-CM | POA: Diagnosis not present

## 2021-01-31 DIAGNOSIS — M069 Rheumatoid arthritis, unspecified: Secondary | ICD-10-CM | POA: Diagnosis not present

## 2021-02-03 DIAGNOSIS — M069 Rheumatoid arthritis, unspecified: Secondary | ICD-10-CM | POA: Diagnosis not present

## 2021-02-04 DIAGNOSIS — M069 Rheumatoid arthritis, unspecified: Secondary | ICD-10-CM | POA: Diagnosis not present

## 2021-02-05 DIAGNOSIS — M069 Rheumatoid arthritis, unspecified: Secondary | ICD-10-CM | POA: Diagnosis not present

## 2021-02-06 DIAGNOSIS — F3110 Bipolar disorder, current episode manic without psychotic features, unspecified: Secondary | ICD-10-CM | POA: Diagnosis not present

## 2021-02-06 DIAGNOSIS — M069 Rheumatoid arthritis, unspecified: Secondary | ICD-10-CM | POA: Diagnosis not present

## 2021-02-07 DIAGNOSIS — M069 Rheumatoid arthritis, unspecified: Secondary | ICD-10-CM | POA: Diagnosis not present

## 2021-02-08 DIAGNOSIS — F3110 Bipolar disorder, current episode manic without psychotic features, unspecified: Secondary | ICD-10-CM | POA: Diagnosis not present

## 2021-02-10 DIAGNOSIS — M069 Rheumatoid arthritis, unspecified: Secondary | ICD-10-CM | POA: Diagnosis not present

## 2021-02-10 DIAGNOSIS — F3110 Bipolar disorder, current episode manic without psychotic features, unspecified: Secondary | ICD-10-CM | POA: Diagnosis not present

## 2021-02-11 DIAGNOSIS — M069 Rheumatoid arthritis, unspecified: Secondary | ICD-10-CM | POA: Diagnosis not present

## 2021-02-12 DIAGNOSIS — F3110 Bipolar disorder, current episode manic without psychotic features, unspecified: Secondary | ICD-10-CM | POA: Diagnosis not present

## 2021-02-12 DIAGNOSIS — M069 Rheumatoid arthritis, unspecified: Secondary | ICD-10-CM | POA: Diagnosis not present

## 2021-02-13 DIAGNOSIS — M069 Rheumatoid arthritis, unspecified: Secondary | ICD-10-CM | POA: Diagnosis not present

## 2021-02-14 DIAGNOSIS — M069 Rheumatoid arthritis, unspecified: Secondary | ICD-10-CM | POA: Diagnosis not present

## 2021-02-17 DIAGNOSIS — M069 Rheumatoid arthritis, unspecified: Secondary | ICD-10-CM | POA: Diagnosis not present

## 2021-02-18 DIAGNOSIS — M069 Rheumatoid arthritis, unspecified: Secondary | ICD-10-CM | POA: Diagnosis not present

## 2021-02-19 DIAGNOSIS — F3110 Bipolar disorder, current episode manic without psychotic features, unspecified: Secondary | ICD-10-CM | POA: Diagnosis not present

## 2021-02-19 DIAGNOSIS — M069 Rheumatoid arthritis, unspecified: Secondary | ICD-10-CM | POA: Diagnosis not present

## 2021-02-20 DIAGNOSIS — M069 Rheumatoid arthritis, unspecified: Secondary | ICD-10-CM | POA: Diagnosis not present

## 2021-02-21 DIAGNOSIS — M069 Rheumatoid arthritis, unspecified: Secondary | ICD-10-CM | POA: Diagnosis not present

## 2021-02-21 DIAGNOSIS — F3110 Bipolar disorder, current episode manic without psychotic features, unspecified: Secondary | ICD-10-CM | POA: Diagnosis not present

## 2021-02-24 ENCOUNTER — Other Ambulatory Visit: Payer: Self-pay | Admitting: Internal Medicine

## 2021-02-24 DIAGNOSIS — M069 Rheumatoid arthritis, unspecified: Secondary | ICD-10-CM | POA: Diagnosis not present

## 2021-02-24 NOTE — Telephone Encounter (Signed)
Future visit in 5 months . 

## 2021-02-25 DIAGNOSIS — M069 Rheumatoid arthritis, unspecified: Secondary | ICD-10-CM | POA: Diagnosis not present

## 2021-02-26 ENCOUNTER — Other Ambulatory Visit: Payer: Self-pay

## 2021-02-26 DIAGNOSIS — F329 Major depressive disorder, single episode, unspecified: Secondary | ICD-10-CM

## 2021-02-26 DIAGNOSIS — M069 Rheumatoid arthritis, unspecified: Secondary | ICD-10-CM | POA: Diagnosis not present

## 2021-02-26 DIAGNOSIS — F4310 Post-traumatic stress disorder, unspecified: Secondary | ICD-10-CM

## 2021-02-26 MED ORDER — SERTRALINE HCL 100 MG PO TABS: 100.0000 mg | ORAL_TABLET | Freq: Every day | ORAL | 1 refills | Status: DC

## 2021-02-27 DIAGNOSIS — F3110 Bipolar disorder, current episode manic without psychotic features, unspecified: Secondary | ICD-10-CM | POA: Diagnosis not present

## 2021-02-27 DIAGNOSIS — M069 Rheumatoid arthritis, unspecified: Secondary | ICD-10-CM | POA: Diagnosis not present

## 2021-02-28 ENCOUNTER — Other Ambulatory Visit: Payer: Self-pay

## 2021-02-28 DIAGNOSIS — M069 Rheumatoid arthritis, unspecified: Secondary | ICD-10-CM | POA: Diagnosis not present

## 2021-02-28 DIAGNOSIS — F3110 Bipolar disorder, current episode manic without psychotic features, unspecified: Secondary | ICD-10-CM | POA: Diagnosis not present

## 2021-03-03 DIAGNOSIS — M069 Rheumatoid arthritis, unspecified: Secondary | ICD-10-CM | POA: Diagnosis not present

## 2021-03-04 DIAGNOSIS — F3110 Bipolar disorder, current episode manic without psychotic features, unspecified: Secondary | ICD-10-CM | POA: Diagnosis not present

## 2021-03-04 DIAGNOSIS — M069 Rheumatoid arthritis, unspecified: Secondary | ICD-10-CM | POA: Diagnosis not present

## 2021-03-05 DIAGNOSIS — M069 Rheumatoid arthritis, unspecified: Secondary | ICD-10-CM | POA: Diagnosis not present

## 2021-03-06 DIAGNOSIS — M069 Rheumatoid arthritis, unspecified: Secondary | ICD-10-CM | POA: Diagnosis not present

## 2021-03-07 DIAGNOSIS — M069 Rheumatoid arthritis, unspecified: Secondary | ICD-10-CM | POA: Diagnosis not present

## 2021-03-10 DIAGNOSIS — M069 Rheumatoid arthritis, unspecified: Secondary | ICD-10-CM | POA: Diagnosis not present

## 2021-03-11 DIAGNOSIS — M069 Rheumatoid arthritis, unspecified: Secondary | ICD-10-CM | POA: Diagnosis not present

## 2021-03-11 DIAGNOSIS — F3110 Bipolar disorder, current episode manic without psychotic features, unspecified: Secondary | ICD-10-CM | POA: Diagnosis not present

## 2021-03-12 DIAGNOSIS — M069 Rheumatoid arthritis, unspecified: Secondary | ICD-10-CM | POA: Diagnosis not present

## 2021-03-13 DIAGNOSIS — M069 Rheumatoid arthritis, unspecified: Secondary | ICD-10-CM | POA: Diagnosis not present

## 2021-03-14 DIAGNOSIS — M069 Rheumatoid arthritis, unspecified: Secondary | ICD-10-CM | POA: Diagnosis not present

## 2021-03-17 DIAGNOSIS — M069 Rheumatoid arthritis, unspecified: Secondary | ICD-10-CM | POA: Diagnosis not present

## 2021-03-18 DIAGNOSIS — M069 Rheumatoid arthritis, unspecified: Secondary | ICD-10-CM | POA: Diagnosis not present

## 2021-03-18 DIAGNOSIS — F3110 Bipolar disorder, current episode manic without psychotic features, unspecified: Secondary | ICD-10-CM | POA: Diagnosis not present

## 2021-03-19 DIAGNOSIS — M069 Rheumatoid arthritis, unspecified: Secondary | ICD-10-CM | POA: Diagnosis not present

## 2021-03-20 DIAGNOSIS — M069 Rheumatoid arthritis, unspecified: Secondary | ICD-10-CM | POA: Diagnosis not present

## 2021-03-21 DIAGNOSIS — M069 Rheumatoid arthritis, unspecified: Secondary | ICD-10-CM | POA: Diagnosis not present

## 2021-03-24 DIAGNOSIS — M069 Rheumatoid arthritis, unspecified: Secondary | ICD-10-CM | POA: Diagnosis not present

## 2021-03-25 DIAGNOSIS — M069 Rheumatoid arthritis, unspecified: Secondary | ICD-10-CM | POA: Diagnosis not present

## 2021-03-25 DIAGNOSIS — F3110 Bipolar disorder, current episode manic without psychotic features, unspecified: Secondary | ICD-10-CM | POA: Diagnosis not present

## 2021-03-26 DIAGNOSIS — M069 Rheumatoid arthritis, unspecified: Secondary | ICD-10-CM | POA: Diagnosis not present

## 2021-03-27 DIAGNOSIS — M069 Rheumatoid arthritis, unspecified: Secondary | ICD-10-CM | POA: Diagnosis not present

## 2021-03-28 DIAGNOSIS — M069 Rheumatoid arthritis, unspecified: Secondary | ICD-10-CM | POA: Diagnosis not present

## 2021-03-31 DIAGNOSIS — M069 Rheumatoid arthritis, unspecified: Secondary | ICD-10-CM | POA: Diagnosis not present

## 2021-04-01 DIAGNOSIS — M069 Rheumatoid arthritis, unspecified: Secondary | ICD-10-CM | POA: Diagnosis not present

## 2021-04-01 DIAGNOSIS — F3110 Bipolar disorder, current episode manic without psychotic features, unspecified: Secondary | ICD-10-CM | POA: Diagnosis not present

## 2021-04-02 DIAGNOSIS — M069 Rheumatoid arthritis, unspecified: Secondary | ICD-10-CM | POA: Diagnosis not present

## 2021-04-07 DIAGNOSIS — M069 Rheumatoid arthritis, unspecified: Secondary | ICD-10-CM | POA: Diagnosis not present

## 2021-04-08 DIAGNOSIS — F3110 Bipolar disorder, current episode manic without psychotic features, unspecified: Secondary | ICD-10-CM | POA: Diagnosis not present

## 2021-04-08 DIAGNOSIS — M069 Rheumatoid arthritis, unspecified: Secondary | ICD-10-CM | POA: Diagnosis not present

## 2021-04-09 DIAGNOSIS — M069 Rheumatoid arthritis, unspecified: Secondary | ICD-10-CM | POA: Diagnosis not present

## 2021-04-10 DIAGNOSIS — M069 Rheumatoid arthritis, unspecified: Secondary | ICD-10-CM | POA: Diagnosis not present

## 2021-04-11 DIAGNOSIS — M069 Rheumatoid arthritis, unspecified: Secondary | ICD-10-CM | POA: Diagnosis not present

## 2021-04-14 DIAGNOSIS — M069 Rheumatoid arthritis, unspecified: Secondary | ICD-10-CM | POA: Diagnosis not present

## 2021-04-15 DIAGNOSIS — F3110 Bipolar disorder, current episode manic without psychotic features, unspecified: Secondary | ICD-10-CM | POA: Diagnosis not present

## 2021-04-15 DIAGNOSIS — M069 Rheumatoid arthritis, unspecified: Secondary | ICD-10-CM | POA: Diagnosis not present

## 2021-04-16 DIAGNOSIS — M069 Rheumatoid arthritis, unspecified: Secondary | ICD-10-CM | POA: Diagnosis not present

## 2021-04-17 DIAGNOSIS — M069 Rheumatoid arthritis, unspecified: Secondary | ICD-10-CM | POA: Diagnosis not present

## 2021-04-18 DIAGNOSIS — M069 Rheumatoid arthritis, unspecified: Secondary | ICD-10-CM | POA: Diagnosis not present

## 2021-04-21 DIAGNOSIS — M069 Rheumatoid arthritis, unspecified: Secondary | ICD-10-CM | POA: Diagnosis not present

## 2021-04-22 DIAGNOSIS — M069 Rheumatoid arthritis, unspecified: Secondary | ICD-10-CM | POA: Diagnosis not present

## 2021-04-22 DIAGNOSIS — F3110 Bipolar disorder, current episode manic without psychotic features, unspecified: Secondary | ICD-10-CM | POA: Diagnosis not present

## 2021-04-23 DIAGNOSIS — M069 Rheumatoid arthritis, unspecified: Secondary | ICD-10-CM | POA: Diagnosis not present

## 2021-04-24 DIAGNOSIS — M069 Rheumatoid arthritis, unspecified: Secondary | ICD-10-CM | POA: Diagnosis not present

## 2021-04-25 DIAGNOSIS — M069 Rheumatoid arthritis, unspecified: Secondary | ICD-10-CM | POA: Diagnosis not present

## 2021-04-28 DIAGNOSIS — M069 Rheumatoid arthritis, unspecified: Secondary | ICD-10-CM | POA: Diagnosis not present

## 2021-04-29 DIAGNOSIS — M069 Rheumatoid arthritis, unspecified: Secondary | ICD-10-CM | POA: Diagnosis not present

## 2021-04-30 DIAGNOSIS — F3110 Bipolar disorder, current episode manic without psychotic features, unspecified: Secondary | ICD-10-CM | POA: Diagnosis not present

## 2021-04-30 DIAGNOSIS — M069 Rheumatoid arthritis, unspecified: Secondary | ICD-10-CM | POA: Diagnosis not present

## 2021-05-01 DIAGNOSIS — M069 Rheumatoid arthritis, unspecified: Secondary | ICD-10-CM | POA: Diagnosis not present

## 2021-05-05 DIAGNOSIS — M069 Rheumatoid arthritis, unspecified: Secondary | ICD-10-CM | POA: Diagnosis not present

## 2021-05-06 DIAGNOSIS — F3110 Bipolar disorder, current episode manic without psychotic features, unspecified: Secondary | ICD-10-CM | POA: Diagnosis not present

## 2021-05-06 DIAGNOSIS — M069 Rheumatoid arthritis, unspecified: Secondary | ICD-10-CM | POA: Diagnosis not present

## 2021-05-08 DIAGNOSIS — M069 Rheumatoid arthritis, unspecified: Secondary | ICD-10-CM | POA: Diagnosis not present

## 2021-05-12 DIAGNOSIS — F3110 Bipolar disorder, current episode manic without psychotic features, unspecified: Secondary | ICD-10-CM | POA: Diagnosis not present

## 2021-05-12 DIAGNOSIS — M069 Rheumatoid arthritis, unspecified: Secondary | ICD-10-CM | POA: Diagnosis not present

## 2021-05-13 DIAGNOSIS — M069 Rheumatoid arthritis, unspecified: Secondary | ICD-10-CM | POA: Diagnosis not present

## 2021-05-14 DIAGNOSIS — M069 Rheumatoid arthritis, unspecified: Secondary | ICD-10-CM | POA: Diagnosis not present

## 2021-05-15 DIAGNOSIS — M069 Rheumatoid arthritis, unspecified: Secondary | ICD-10-CM | POA: Diagnosis not present

## 2021-05-16 DIAGNOSIS — M069 Rheumatoid arthritis, unspecified: Secondary | ICD-10-CM | POA: Diagnosis not present

## 2021-05-19 DIAGNOSIS — M069 Rheumatoid arthritis, unspecified: Secondary | ICD-10-CM | POA: Diagnosis not present

## 2021-05-20 DIAGNOSIS — M069 Rheumatoid arthritis, unspecified: Secondary | ICD-10-CM | POA: Diagnosis not present

## 2021-05-21 ENCOUNTER — Telehealth: Payer: Self-pay

## 2021-05-21 DIAGNOSIS — F3110 Bipolar disorder, current episode manic without psychotic features, unspecified: Secondary | ICD-10-CM | POA: Diagnosis not present

## 2021-05-21 DIAGNOSIS — M069 Rheumatoid arthritis, unspecified: Secondary | ICD-10-CM | POA: Diagnosis not present

## 2021-05-21 NOTE — Telephone Encounter (Signed)
Please advise if this is something that requires an appt since he haven't been seen in the last 55mths and most insurance require updated offices notes with the problem documented.

## 2021-05-21 NOTE — Telephone Encounter (Signed)
Yes, he has to be seen for this

## 2021-05-21 NOTE — Telephone Encounter (Signed)
Copied from [place] [phone]. Topic: General - Other >> May 16, 2021  1:01 PM Erick Blinks wrote: Reason for CRM: Pt called to report that he needs his PCP to request a new powerchair or for his current one to be "worked on" because it is currently malfunctioning. Please advise, pt says he needs PCP to contact his insurance to make the request   Best contact: [phone]

## 2021-05-21 NOTE — Telephone Encounter (Signed)
This is an Company secretary, Caryl Pina how can we check on this?

## 2021-05-21 NOTE — Telephone Encounter (Signed)
Copied from [place] [phone]. Topic: [facility] >> May 16, 2021 12:30 PM Alanda Slim E wrote: Reason for CRM: Pts home care service advised pt that his [employer] insurance wont pay for them anymore/ pt wanted to check with office to see if it will still run through for office/ he stated [employer] advised he is still enrolled / please advise

## 2021-05-22 DIAGNOSIS — M069 Rheumatoid arthritis, unspecified: Secondary | ICD-10-CM | POA: Diagnosis not present

## 2021-05-22 NOTE — Telephone Encounter (Signed)
I called and left a detail message on the patient vm that he need an appt in order for [place] to place a order for a powerchair.

## 2021-05-23 ENCOUNTER — Telehealth: Payer: Self-pay

## 2021-05-23 DIAGNOSIS — M069 Rheumatoid arthritis, unspecified: Secondary | ICD-10-CM | POA: Diagnosis not present

## 2021-05-23 NOTE — Telephone Encounter (Signed)
Copied from [place] [id number]. Topic: General - Other >> May 22, 2021 11:21 AM Tessa Lerner A wrote: Reason for CRM: The patient has spoken with their insurance company [employer]) and been directed to contact their PCP  The patient was instructed to request orders/a prescription for powerchair repair from their PCP   The patient is uncertain of where/who will be repairing the [place] can be reached by phone at [phone] for further instruction/clarification  Please contact the patient further if needed

## 2021-05-23 NOTE — Telephone Encounter (Signed)
Appt scheduled for the patient to come in and discuss his need for a new order for a power wheelchair.

## 2021-05-26 DIAGNOSIS — F3110 Bipolar disorder, current episode manic without psychotic features, unspecified: Secondary | ICD-10-CM | POA: Diagnosis not present

## 2021-05-26 DIAGNOSIS — M069 Rheumatoid arthritis, unspecified: Secondary | ICD-10-CM | POA: Diagnosis not present

## 2021-05-27 DIAGNOSIS — M069 Rheumatoid arthritis, unspecified: Secondary | ICD-10-CM | POA: Diagnosis not present

## 2021-05-28 DIAGNOSIS — F3110 Bipolar disorder, current episode manic without psychotic features, unspecified: Secondary | ICD-10-CM | POA: Diagnosis not present

## 2021-05-28 DIAGNOSIS — M069 Rheumatoid arthritis, unspecified: Secondary | ICD-10-CM | POA: Diagnosis not present

## 2021-05-29 DIAGNOSIS — M069 Rheumatoid arthritis, unspecified: Secondary | ICD-10-CM | POA: Diagnosis not present

## 2021-05-30 DIAGNOSIS — M069 Rheumatoid arthritis, unspecified: Secondary | ICD-10-CM | POA: Diagnosis not present

## 2021-06-02 DIAGNOSIS — F3110 Bipolar disorder, current episode manic without psychotic features, unspecified: Secondary | ICD-10-CM | POA: Diagnosis not present

## 2021-06-02 DIAGNOSIS — M069 Rheumatoid arthritis, unspecified: Secondary | ICD-10-CM | POA: Diagnosis not present

## 2021-06-03 DIAGNOSIS — M069 Rheumatoid arthritis, unspecified: Secondary | ICD-10-CM | POA: Diagnosis not present

## 2021-06-04 DIAGNOSIS — M069 Rheumatoid arthritis, unspecified: Secondary | ICD-10-CM | POA: Diagnosis not present

## 2021-06-05 DIAGNOSIS — M069 Rheumatoid arthritis, unspecified: Secondary | ICD-10-CM | POA: Diagnosis not present

## 2021-06-06 DIAGNOSIS — M069 Rheumatoid arthritis, unspecified: Secondary | ICD-10-CM | POA: Diagnosis not present

## 2021-06-09 DIAGNOSIS — M069 Rheumatoid arthritis, unspecified: Secondary | ICD-10-CM | POA: Diagnosis not present

## 2021-06-10 DIAGNOSIS — M069 Rheumatoid arthritis, unspecified: Secondary | ICD-10-CM | POA: Diagnosis not present

## 2021-06-11 ENCOUNTER — Encounter: Payer: Self-pay | Admitting: Internal Medicine

## 2021-06-11 ENCOUNTER — Ambulatory Visit (INDEPENDENT_AMBULATORY_CARE_PROVIDER_SITE_OTHER): Payer: Medicaid Other | Admitting: Internal Medicine

## 2021-06-11 ENCOUNTER — Other Ambulatory Visit: Payer: Self-pay

## 2021-06-11 VITALS — BP 142/83 | HR 88 | Temp 97.5°F | Resp 18 | Ht 67.0 in | Wt 162.2 lb

## 2021-06-11 DIAGNOSIS — F3110 Bipolar disorder, current episode manic without psychotic features, unspecified: Secondary | ICD-10-CM | POA: Diagnosis not present

## 2021-06-11 DIAGNOSIS — Z23 Encounter for immunization: Secondary | ICD-10-CM

## 2021-06-11 DIAGNOSIS — H04203 Unspecified epiphora, bilateral lacrimal glands: Secondary | ICD-10-CM

## 2021-06-11 DIAGNOSIS — K219 Gastro-esophageal reflux disease without esophagitis: Secondary | ICD-10-CM

## 2021-06-11 DIAGNOSIS — R269 Unspecified abnormalities of gait and mobility: Secondary | ICD-10-CM

## 2021-06-11 DIAGNOSIS — H0520 Unspecified exophthalmos: Secondary | ICD-10-CM

## 2021-06-11 DIAGNOSIS — R6889 Other general symptoms and signs: Secondary | ICD-10-CM

## 2021-06-11 DIAGNOSIS — H579 Unspecified disorder of eye and adnexa: Secondary | ICD-10-CM

## 2021-06-11 DIAGNOSIS — M87051 Idiopathic aseptic necrosis of right femur: Secondary | ICD-10-CM | POA: Diagnosis not present

## 2021-06-11 DIAGNOSIS — M069 Rheumatoid arthritis, unspecified: Secondary | ICD-10-CM | POA: Diagnosis not present

## 2021-06-11 MED ORDER — OMEPRAZOLE 20 MG PO CPDR: 20.0000 mg | DELAYED_RELEASE_CAPSULE | Freq: Every day | ORAL | 1 refills | Status: DC

## 2021-06-11 MED ORDER — GABAPENTIN 100 MG PO CAPS: 100.0000 mg | ORAL_CAPSULE | Freq: Every evening | ORAL | 1 refills | Status: DC | PRN

## 2021-06-11 MED ORDER — FAMOTIDINE 20 MG PO TABS: 20.0000 mg | ORAL_TABLET | Freq: Two times a day (BID) | ORAL | 1 refills | Status: DC

## 2021-06-11 MED ORDER — LOSARTAN POTASSIUM 100 MG PO TABS: 100.0000 mg | ORAL_TABLET | Freq: Every day | ORAL | 1 refills | Status: DC

## 2021-06-11 MED ORDER — AMLODIPINE BESYLATE 10 MG PO TABS: 10.0000 mg | ORAL_TABLET | Freq: Every day | ORAL | 1 refills | Status: DC

## 2021-06-11 MED ORDER — BUPROPION HCL ER (XL) 150 MG PO TB24: 150.0000 mg | ORAL_TABLET | Freq: Every day | ORAL | 1 refills | Status: DC

## 2021-06-11 MED ORDER — OLOPATADINE HCL 0.2 % OP SOLN: 1.0000 [drp] | Freq: Every day | OPHTHALMIC | 0 refills | Status: DC

## 2021-06-11 NOTE — Assessment & Plan Note (Signed)
Deteriorated Rx for Omeprazole 20 mg daily in addition to his Famotidine

## 2021-06-11 NOTE — Patient Instructions (Signed)

## 2021-06-11 NOTE — Progress Notes (Signed)
Subjective:    Patient ID: Noah Velasquez, male    DOB: 12/10/1965, 55 y.o.   MRN: [id number]  HPI  Patient presents to clinic today requesting forms completed for him to obtain a new power wheelchair.  He is currently in a power wheelchair due to avascular necrosis of the right hip.  He was scheduled to have this repaired March 2020 however this was put on hold secondary to [place].  He reports he has difficulty walking.  He can walk with a cane for about 20 feet before he has to stop secondary to pain.  He typically stands and pivots for transfers.  He reports his current power wheelchair is completely falling apart and no one will repair it.  He is also concerned about Graves' disease.  He reports bulging, itchy, watery eyes with light sensitivity.  He has had a normal TSH and free T4 in the past.  He reports he has seen an eye doctor for the same and they cannot give him any information as to why his eyes bulge like this.  He also reports intermittent nausea and vomiting after eating.  He reports this started 6 to 7 months ago.  He cannot attribute this to certain foods.  He denies diarrhea, constipation or blood in his stool.  He reports he has lost 10 pounds since his last visit.  He takes Famotidine daily as prescribed.  Review of Systems     Past Medical History:  Diagnosis Date   Chronic hip pain    Chronic pancreatitis (HCC)    CKD (chronic kidney disease) stage 3, GFR 30-59 ml/min (HCC)    GERD (gastroesophageal reflux disease)    Hyperlipidemia    Hypertension    Osteoarthritis    Pancreatitis    Pancreatitis    PTSD (post-traumatic stress disorder)    Viral infection of left eye     Current Outpatient Medications  Medication Sig Dispense Refill   amLODipine (NORVASC) 10 MG tablet Take 1 tablet (10 mg total) by mouth daily. 90 tablet 1   Blood Pressure KIT Please allow patient to chose from machines that are covered under insurance 1 kit 0   buPROPion (WELLBUTRIN XL) 150  MG 24 hr tablet Take 1 tablet (150 mg total) by mouth daily. 90 tablet 1   famotidine (PEPCID) 20 MG tablet Take 1 tablet (20 mg total) by mouth 2 (two) times daily. 180 tablet 1   fluticasone (FLONASE) 50 MCG/ACT nasal spray Place 1 spray into both nostrils daily as needed for allergies or rhinitis. 16 mL 1   gabapentin (NEURONTIN) 100 MG capsule Take 1 capsule (100 mg total) by mouth at bedtime as needed (sleep). 90 capsule 1   loratadine (CLARITIN) 10 MG tablet Take 1 tablet (10 mg total) by mouth daily. 30 tablet 3   losartan (COZAAR) 100 MG tablet Take 1 tablet (100 mg total) by mouth daily. 90 tablet 1   naproxen (NAPROSYN) 500 MG tablet TAKE 1 TABLET (500 MG TOTAL) BY MOUTH 2 (TWO) TIMES DAILY AS NEEDED FOR MILD PAIN OR MODERATE PAIN. 30 tablet 1   nicotine (NICODERM CQ - DOSED IN MG/24 HOURS) 14 mg/24hr patch Place 1 patch (14 mg total) onto the skin daily. 14 patch 0   Pancrelipase, Lip-Prot-Amyl, (ZENPEP) 27782-42353 units CPEP TAKE 1 CAPSULE BY MOUTH 3 (THREE) TIMES DAILY WITH MEALS. 270 capsule 1   sertraline (ZOLOFT) 100 MG tablet Take 1 tablet (100 mg total) by mouth daily. 90 tablet 1  traZODone (DESYREL) 150 MG tablet Take 1 tablet (150 mg total) by mouth at bedtime as needed for sleep. 30 tablet 5   No current facility-administered medications for this visit.    Allergies  Allergen Reactions   Hctz [Hydrochlorothiazide] Swelling    Family History  Problem Relation Age of Onset   Diabetes Mother    Sleep apnea Mother    Emphysema Sister    CAD Neg Hx    Mental illness Neg Hx     Social History   Socioeconomic History   Marital status: Legally Separated    Spouse name: Not on file   Number of children: 2   Years of education: Not on file   Highest education level: High school graduate  Occupational History   Occupation: on disability  Tobacco Use   Smoking status: Every Day    Packs/day: 0.25    Years: 15.00    Pack years: 3.75    Types: Cigarettes    Smokeless tobacco: Never  Vaping Use   Vaping Use: Never used  Substance and Sexual Activity   Alcohol use: Yes    Alcohol/week: 7.0 standard drinks    Types: 7 Cans of beer per week    Comment: weekly   Drug use: Yes    Types: Marijuana   Sexual activity: Yes    Birth control/protection: Condom  Other Topics Concern   Not on file  Social History Narrative   Not on file   Social Determinants of Health   Financial Resource Strain: Not on file  Food Insecurity: Not on file  Transportation Needs: Not on file  Physical Activity: Not on file  Stress: Not on file  Social Connections: Not on file  Intimate Partner Violence: Not on file     Constitutional: Denies fever, malaise, fatigue, headache or abrupt weight changes.  HEENT: Patient reports bulging, itchy, watery eyes that are sensitive to light.  Denies eye pain, eye redness, ear pain, ringing in the ears, wax buildup, runny nose, nasal congestion, bloody nose, or sore throat. Respiratory: Denies difficulty breathing, shortness of breath, cough or sputum production.   Cardiovascular: Denies chest pain, chest tightness, palpitations or swelling in the hands or feet.  Gastrointestinal: Patient reports intermittent nausea and vomiting.  Denies abdominal pain, bloating, constipation, diarrhea or blood in the stool.  GU: Denies urgency, frequency, pain with urination, burning sensation, blood in urine, odor or discharge. Musculoskeletal: Patient reports chronic right hip pain, difficulty with gait.  Denies decrease in range of motion, muscle pain or joint swelling.  Skin: Denies redness, rashes, lesions or ulcercations.    No other specific complaints in a complete review of systems (except as listed in HPI above).  Objective:   Physical Exam  BP (!) 142/83 (BP Location: Right Arm, Patient Position: Sitting, Cuff Size: Normal)   Pulse 88   Temp (!) 97.5 F (36.4 C) (Temporal)   Resp 18   Ht '5\' 7"'  (1.702 m)   Wt 162 lb 3.2  oz (73.6 kg)   SpO2 100%   BMI 25.40 kg/m   Wt Readings from Last 3 Encounters:  01/23/21 172 lb 3.2 oz (78.1 kg)  06/06/20 179 lb 3.2 oz (81.3 kg)  03/07/20 174 lb (78.9 kg)    General: Appears his stated age, chronically ill-appearing, in NAD. Skin: Warm, dry and intact.  HEENT: Head: normal shape and size; Eyes: Exophthalmos noted: Sclera white, no icterus, conjunctiva pink, PERRLA and EOMs intact;  Neck:  Neck supple, trachea  midline. No masses, lumps or thyromegaly present.  Cardiovascular: Normal rate and rhythm.  Pulmonary/Chest: Normal effort and positive vesicular breath sounds. No respiratory distress. No wheezes, rales or ronchi noted.  Abdomen: Soft and nontender. Normal bowel sounds.  Musculoskeletal: He reports he cannot get out of his power wheelchair to get onto the table for exam of his right hip at this time. Neurological: Alert and oriented.      BMET    Component Value Date/Time   NA 134 (L) 06/06/2020 1141   K 4.1 06/06/2020 1141   CL 103 06/06/2020 1141   CO2 23 06/06/2020 1141   GLUCOSE 99 06/06/2020 1141   BUN 11 06/06/2020 1141   CREATININE 1.26 06/06/2020 1141   CALCIUM 9.1 06/06/2020 1141   GFRNONAA 64 06/06/2020 1141   GFRAA 74 06/06/2020 1141    Lipid Panel     Component Value Date/Time   CHOL 150 06/06/2020 1141   TRIG 59 06/06/2020 1141   HDL 48 06/06/2020 1141   CHOLHDL 3.1 06/06/2020 1141   VLDL 18 06/12/2018 1015   LDLCALC 88 06/06/2020 1141    CBC    Component Value Date/Time   WBC 7.8 06/06/2020 1141   RBC 4.40 06/06/2020 1141   HGB 14.1 06/06/2020 1141   HCT 41.7 06/06/2020 1141   PLT 307 06/06/2020 1141   MCV 94.8 06/06/2020 1141   MCH 32.0 06/06/2020 1141   MCHC 33.8 06/06/2020 1141   RDW 12.3 06/06/2020 1141   LYMPHSABS 2,075 06/06/2020 1141   MONOABS 0.8 12/13/2019 1028   EOSABS 476 06/06/2020 1141   BASOSABS 70 06/06/2020 1141    Hgb A1C Lab Results  Component Value Date   HGBA1C 5.2 01/10/2020           Assessment & Plan:   Difficulty with Gait secondary to Avascular Necrosis of the Right Pip:  He is currently in a power wheelchair for assistance with ambulation He does plan to have his hip repaired at some point Rx for power chair will be sent with this note to help weekly Medicaid  Exophthalmos, Sensitivity to Light, itchy and Watery Eyes:  Rx for Pataday 1 drop each eye daily Referral to ophthalmology for second opinion We will check TPO antibodies today  Return precautions discussed, will follow up with patient after labs are resulted with further recommendation and treatment plan Webb Silversmith, NP This visit occurred during the SARS-CoV-2 public health emergency.  Safety protocols were in place, including screening questions prior to the visit, additional usage of staff PPE, and extensive cleaning of exam room while observing appropriate contact time as indicated for disinfecting solutions.

## 2021-06-12 DIAGNOSIS — M069 Rheumatoid arthritis, unspecified: Secondary | ICD-10-CM | POA: Diagnosis not present

## 2021-06-12 LAB — THYROID PEROXIDASE ANTIBODIES (TPO) (REFL): Thyroperoxidase Ab SerPl-aCnc: 1 IU/mL (ref <9)

## 2021-06-13 DIAGNOSIS — M069 Rheumatoid arthritis, unspecified: Secondary | ICD-10-CM | POA: Diagnosis not present

## 2021-06-16 DIAGNOSIS — M069 Rheumatoid arthritis, unspecified: Secondary | ICD-10-CM | POA: Diagnosis not present

## 2021-06-17 DIAGNOSIS — F3110 Bipolar disorder, current episode manic without psychotic features, unspecified: Secondary | ICD-10-CM | POA: Diagnosis not present

## 2021-06-17 DIAGNOSIS — M069 Rheumatoid arthritis, unspecified: Secondary | ICD-10-CM | POA: Diagnosis not present

## 2021-06-18 DIAGNOSIS — M069 Rheumatoid arthritis, unspecified: Secondary | ICD-10-CM | POA: Diagnosis not present

## 2021-06-19 DIAGNOSIS — M069 Rheumatoid arthritis, unspecified: Secondary | ICD-10-CM | POA: Diagnosis not present

## 2021-06-20 DIAGNOSIS — M069 Rheumatoid arthritis, unspecified: Secondary | ICD-10-CM | POA: Diagnosis not present

## 2021-06-23 DIAGNOSIS — M069 Rheumatoid arthritis, unspecified: Secondary | ICD-10-CM | POA: Diagnosis not present

## 2021-06-24 DIAGNOSIS — M069 Rheumatoid arthritis, unspecified: Secondary | ICD-10-CM | POA: Diagnosis not present

## 2021-06-25 DIAGNOSIS — M069 Rheumatoid arthritis, unspecified: Secondary | ICD-10-CM | POA: Diagnosis not present

## 2021-06-26 DIAGNOSIS — M069 Rheumatoid arthritis, unspecified: Secondary | ICD-10-CM | POA: Diagnosis not present

## 2021-06-27 DIAGNOSIS — F3110 Bipolar disorder, current episode manic without psychotic features, unspecified: Secondary | ICD-10-CM | POA: Diagnosis not present

## 2021-06-27 DIAGNOSIS — M069 Rheumatoid arthritis, unspecified: Secondary | ICD-10-CM | POA: Diagnosis not present

## 2021-06-30 DIAGNOSIS — M069 Rheumatoid arthritis, unspecified: Secondary | ICD-10-CM | POA: Diagnosis not present

## 2021-07-01 DIAGNOSIS — M069 Rheumatoid arthritis, unspecified: Secondary | ICD-10-CM | POA: Diagnosis not present

## 2021-07-02 DIAGNOSIS — M069 Rheumatoid arthritis, unspecified: Secondary | ICD-10-CM | POA: Diagnosis not present

## 2021-07-03 DIAGNOSIS — M069 Rheumatoid arthritis, unspecified: Secondary | ICD-10-CM | POA: Diagnosis not present

## 2021-07-04 DIAGNOSIS — F3110 Bipolar disorder, current episode manic without psychotic features, unspecified: Secondary | ICD-10-CM | POA: Diagnosis not present

## 2021-07-07 DIAGNOSIS — M069 Rheumatoid arthritis, unspecified: Secondary | ICD-10-CM | POA: Diagnosis not present

## 2021-07-08 DIAGNOSIS — M069 Rheumatoid arthritis, unspecified: Secondary | ICD-10-CM | POA: Diagnosis not present

## 2021-07-08 DIAGNOSIS — F3110 Bipolar disorder, current episode manic without psychotic features, unspecified: Secondary | ICD-10-CM | POA: Diagnosis not present

## 2021-07-09 DIAGNOSIS — M069 Rheumatoid arthritis, unspecified: Secondary | ICD-10-CM | POA: Diagnosis not present

## 2021-07-10 DIAGNOSIS — M069 Rheumatoid arthritis, unspecified: Secondary | ICD-10-CM | POA: Diagnosis not present

## 2021-07-11 DIAGNOSIS — M069 Rheumatoid arthritis, unspecified: Secondary | ICD-10-CM | POA: Diagnosis not present

## 2021-07-14 DIAGNOSIS — M069 Rheumatoid arthritis, unspecified: Secondary | ICD-10-CM | POA: Diagnosis not present

## 2021-07-15 DIAGNOSIS — M069 Rheumatoid arthritis, unspecified: Secondary | ICD-10-CM | POA: Diagnosis not present

## 2021-07-16 DIAGNOSIS — M069 Rheumatoid arthritis, unspecified: Secondary | ICD-10-CM | POA: Diagnosis not present

## 2021-07-16 DIAGNOSIS — F3110 Bipolar disorder, current episode manic without psychotic features, unspecified: Secondary | ICD-10-CM | POA: Diagnosis not present

## 2021-07-17 DIAGNOSIS — M069 Rheumatoid arthritis, unspecified: Secondary | ICD-10-CM | POA: Diagnosis not present

## 2021-07-18 DIAGNOSIS — M069 Rheumatoid arthritis, unspecified: Secondary | ICD-10-CM | POA: Diagnosis not present

## 2021-07-21 DIAGNOSIS — M069 Rheumatoid arthritis, unspecified: Secondary | ICD-10-CM | POA: Diagnosis not present

## 2021-07-22 DIAGNOSIS — M069 Rheumatoid arthritis, unspecified: Secondary | ICD-10-CM | POA: Diagnosis not present

## 2021-07-23 ENCOUNTER — Other Ambulatory Visit: Payer: Self-pay | Admitting: Internal Medicine

## 2021-07-23 ENCOUNTER — Telehealth: Payer: Self-pay | Admitting: Internal Medicine

## 2021-07-23 DIAGNOSIS — M069 Rheumatoid arthritis, unspecified: Secondary | ICD-10-CM | POA: Diagnosis not present

## 2021-07-23 DIAGNOSIS — H0520 Unspecified exophthalmos: Secondary | ICD-10-CM

## 2021-07-23 NOTE — Telephone Encounter (Signed)
Requested Prescriptions  Pending Prescriptions Disp Refills  . Olopatadine HCl 0.2 % SOLN [Pharmacy Med Name: OLOPATADINE HCL 0.2% EYE SOLN ML] 2.5 mL 0    Sig: APPLY 1 DROP TO EYE ONCE DAILY     Ophthalmology:  Antiallergy Passed - 07/23/2021  8:58 AM      Passed - Valid encounter within last 12 months    Recent Outpatient Visits          1 month ago [facility] [place], Coralie Keens, NP   6 months ago Posttraumatic stress disorder   [facility] [place], Coralie Keens, NP   1 year ago Essential hypertension   [facility], Lupita Raider, FNP   1 year ago Left testicular pain   [place], FNP   1 year ago Essential hypertension   [place], FNP      Future Appointments            In 2 weeks Garnette Gunner, Coralie Keens, NP [facility], [facility]

## 2021-07-23 NOTE — Telephone Encounter (Signed)
Requested medications are due for refill today.  Unsure  Requested medications are on the active medications list.  no  Last refill. 02/26/2021  Future visit scheduled.   yes  Notes to clinic.  Medication is not on med list.    Requested Prescriptions  Pending Prescriptions Disp Refills   sertraline (ZOLOFT) 100 MG tablet [Pharmacy Med Name: SERTRALINE HCL 100 MG TAB] 90 tablet     Sig: TAKE 1 TABLET BY MOUTH ONCE DAILY     Psychiatry:  Antidepressants - SSRI Passed - 07/23/2021  3:11 PM      Passed - Completed PHQ-2 or PHQ-9 in the last 360 days      Passed - Valid encounter within last 6 months    Recent Outpatient Visits           1 month ago [facility] [place], Coralie Keens, NP   6 months ago Posttraumatic stress disorder   [facility] [place], Coralie Keens, NP   1 year ago Essential hypertension   [facility], Lupita Raider, [place]   1 year ago Left testicular pain   [place], FNP   1 year ago Essential hypertension   [facility], Lupita Raider, FNP       Future Appointments             In 2 weeks Garnette Gunner, Coralie Keens, NP [facility], [facility]

## 2021-07-23 NOTE — Telephone Encounter (Signed)
Pt stated he spoke with is his pharmacy [facility] regarding how many days his prescriptions are for. Stated he has been receiving 30 day supplies and would like for them to be 90 days because he cannot afford to pay every month or twice a month. Please advise. [phone]

## 2021-07-23 NOTE — Telephone Encounter (Signed)
Patient called and advised a 90 day supply of medications is being sent in. He says the pharmacy is only giving 30 days and to let his doctor know. I advised I will call the pharmacy for an explanation. [facility] called and spoke to [facility], Education administrator about the patient's concerns. She says because he has medicaid, he can only received a 30 day supply of medication at a time and they have several medications available for pickup. Patient called back and advised, he says he is going to switch pharmacies, then hung up the phone.

## 2021-07-24 DIAGNOSIS — M069 Rheumatoid arthritis, unspecified: Secondary | ICD-10-CM | POA: Diagnosis not present

## 2021-07-24 DIAGNOSIS — F3110 Bipolar disorder, current episode manic without psychotic features, unspecified: Secondary | ICD-10-CM | POA: Diagnosis not present

## 2021-07-25 DIAGNOSIS — M069 Rheumatoid arthritis, unspecified: Secondary | ICD-10-CM | POA: Diagnosis not present

## 2021-07-28 ENCOUNTER — Encounter: Payer: Medicaid Other | Admitting: Internal Medicine

## 2021-07-28 DIAGNOSIS — M069 Rheumatoid arthritis, unspecified: Secondary | ICD-10-CM | POA: Diagnosis not present

## 2021-07-29 DIAGNOSIS — M069 Rheumatoid arthritis, unspecified: Secondary | ICD-10-CM | POA: Diagnosis not present

## 2021-07-30 DIAGNOSIS — M069 Rheumatoid arthritis, unspecified: Secondary | ICD-10-CM | POA: Diagnosis not present

## 2021-07-31 DIAGNOSIS — F3110 Bipolar disorder, current episode manic without psychotic features, unspecified: Secondary | ICD-10-CM | POA: Diagnosis not present

## 2021-07-31 DIAGNOSIS — M069 Rheumatoid arthritis, unspecified: Secondary | ICD-10-CM | POA: Diagnosis not present

## 2021-08-01 DIAGNOSIS — M069 Rheumatoid arthritis, unspecified: Secondary | ICD-10-CM | POA: Diagnosis not present

## 2021-08-04 DIAGNOSIS — M069 Rheumatoid arthritis, unspecified: Secondary | ICD-10-CM | POA: Diagnosis not present

## 2021-08-05 ENCOUNTER — Encounter: Payer: Medicaid Other | Admitting: Internal Medicine

## 2021-08-05 DIAGNOSIS — M069 Rheumatoid arthritis, unspecified: Secondary | ICD-10-CM | POA: Diagnosis not present

## 2021-08-06 DIAGNOSIS — M069 Rheumatoid arthritis, unspecified: Secondary | ICD-10-CM | POA: Diagnosis not present

## 2021-08-07 DIAGNOSIS — M069 Rheumatoid arthritis, unspecified: Secondary | ICD-10-CM | POA: Diagnosis not present

## 2021-08-07 DIAGNOSIS — F3110 Bipolar disorder, current episode manic without psychotic features, unspecified: Secondary | ICD-10-CM | POA: Diagnosis not present

## 2021-08-08 DIAGNOSIS — M069 Rheumatoid arthritis, unspecified: Secondary | ICD-10-CM | POA: Diagnosis not present

## 2021-08-11 DIAGNOSIS — M069 Rheumatoid arthritis, unspecified: Secondary | ICD-10-CM | POA: Diagnosis not present

## 2021-08-12 ENCOUNTER — Encounter: Payer: Medicaid Other | Admitting: Internal Medicine

## 2021-08-12 DIAGNOSIS — M069 Rheumatoid arthritis, unspecified: Secondary | ICD-10-CM | POA: Diagnosis not present

## 2021-08-12 DIAGNOSIS — F3110 Bipolar disorder, current episode manic without psychotic features, unspecified: Secondary | ICD-10-CM | POA: Diagnosis not present

## 2021-08-13 DIAGNOSIS — M069 Rheumatoid arthritis, unspecified: Secondary | ICD-10-CM | POA: Diagnosis not present

## 2021-08-14 DIAGNOSIS — M069 Rheumatoid arthritis, unspecified: Secondary | ICD-10-CM | POA: Diagnosis not present

## 2021-08-17 DIAGNOSIS — M069 Rheumatoid arthritis, unspecified: Secondary | ICD-10-CM | POA: Diagnosis not present

## 2021-08-18 DIAGNOSIS — M069 Rheumatoid arthritis, unspecified: Secondary | ICD-10-CM | POA: Diagnosis not present

## 2021-08-19 DIAGNOSIS — F3110 Bipolar disorder, current episode manic without psychotic features, unspecified: Secondary | ICD-10-CM | POA: Diagnosis not present

## 2021-08-19 DIAGNOSIS — M069 Rheumatoid arthritis, unspecified: Secondary | ICD-10-CM | POA: Diagnosis not present

## 2021-08-20 DIAGNOSIS — M069 Rheumatoid arthritis, unspecified: Secondary | ICD-10-CM | POA: Diagnosis not present

## 2021-08-21 DIAGNOSIS — M069 Rheumatoid arthritis, unspecified: Secondary | ICD-10-CM | POA: Diagnosis not present

## 2021-08-22 DIAGNOSIS — M069 Rheumatoid arthritis, unspecified: Secondary | ICD-10-CM | POA: Diagnosis not present

## 2021-08-25 DIAGNOSIS — M069 Rheumatoid arthritis, unspecified: Secondary | ICD-10-CM | POA: Diagnosis not present

## 2021-08-26 DIAGNOSIS — M069 Rheumatoid arthritis, unspecified: Secondary | ICD-10-CM | POA: Diagnosis not present

## 2021-08-27 DIAGNOSIS — M069 Rheumatoid arthritis, unspecified: Secondary | ICD-10-CM | POA: Diagnosis not present

## 2021-08-28 DIAGNOSIS — M069 Rheumatoid arthritis, unspecified: Secondary | ICD-10-CM | POA: Diagnosis not present

## 2021-08-29 DIAGNOSIS — F3110 Bipolar disorder, current episode manic without psychotic features, unspecified: Secondary | ICD-10-CM | POA: Diagnosis not present

## 2021-08-29 DIAGNOSIS — M069 Rheumatoid arthritis, unspecified: Secondary | ICD-10-CM | POA: Diagnosis not present

## 2021-09-01 DIAGNOSIS — M069 Rheumatoid arthritis, unspecified: Secondary | ICD-10-CM | POA: Diagnosis not present

## 2021-09-02 DIAGNOSIS — M069 Rheumatoid arthritis, unspecified: Secondary | ICD-10-CM | POA: Diagnosis not present

## 2021-09-03 DIAGNOSIS — M069 Rheumatoid arthritis, unspecified: Secondary | ICD-10-CM | POA: Diagnosis not present

## 2021-09-04 DIAGNOSIS — M069 Rheumatoid arthritis, unspecified: Secondary | ICD-10-CM | POA: Diagnosis not present

## 2021-09-05 DIAGNOSIS — M069 Rheumatoid arthritis, unspecified: Secondary | ICD-10-CM | POA: Diagnosis not present

## 2021-09-05 DIAGNOSIS — F3110 Bipolar disorder, current episode manic without psychotic features, unspecified: Secondary | ICD-10-CM | POA: Diagnosis not present

## 2021-09-08 DIAGNOSIS — M069 Rheumatoid arthritis, unspecified: Secondary | ICD-10-CM | POA: Diagnosis not present

## 2021-09-09 DIAGNOSIS — M069 Rheumatoid arthritis, unspecified: Secondary | ICD-10-CM | POA: Diagnosis not present

## 2021-09-10 DIAGNOSIS — M069 Rheumatoid arthritis, unspecified: Secondary | ICD-10-CM | POA: Diagnosis not present

## 2021-09-11 DIAGNOSIS — M069 Rheumatoid arthritis, unspecified: Secondary | ICD-10-CM | POA: Diagnosis not present

## 2021-09-12 DIAGNOSIS — M069 Rheumatoid arthritis, unspecified: Secondary | ICD-10-CM | POA: Diagnosis not present

## 2021-09-12 DIAGNOSIS — F3110 Bipolar disorder, current episode manic without psychotic features, unspecified: Secondary | ICD-10-CM | POA: Diagnosis not present

## 2021-09-15 DIAGNOSIS — M069 Rheumatoid arthritis, unspecified: Secondary | ICD-10-CM | POA: Diagnosis not present

## 2021-09-15 DIAGNOSIS — F3110 Bipolar disorder, current episode manic without psychotic features, unspecified: Secondary | ICD-10-CM | POA: Diagnosis not present

## 2021-09-16 DIAGNOSIS — M069 Rheumatoid arthritis, unspecified: Secondary | ICD-10-CM | POA: Diagnosis not present

## 2021-09-17 DIAGNOSIS — M069 Rheumatoid arthritis, unspecified: Secondary | ICD-10-CM | POA: Diagnosis not present

## 2021-09-18 DIAGNOSIS — M069 Rheumatoid arthritis, unspecified: Secondary | ICD-10-CM | POA: Diagnosis not present

## 2021-09-19 DIAGNOSIS — M069 Rheumatoid arthritis, unspecified: Secondary | ICD-10-CM | POA: Diagnosis not present

## 2021-09-22 DIAGNOSIS — M069 Rheumatoid arthritis, unspecified: Secondary | ICD-10-CM | POA: Diagnosis not present

## 2021-09-23 ENCOUNTER — Ambulatory Visit
Admission: RE | Admit: 2021-09-23 | Discharge: 2021-09-23 | Disposition: A | Discharge status: Home / Self Care | Payer: Medicaid Other | Source: Ambulatory Visit | Attending: Internal Medicine | Admitting: Internal Medicine

## 2021-09-23 ENCOUNTER — Other Ambulatory Visit: Payer: Self-pay

## 2021-09-23 ENCOUNTER — Encounter: Payer: Self-pay | Admitting: Internal Medicine

## 2021-09-23 ENCOUNTER — Ambulatory Visit: Payer: Medicaid Other | Admitting: Internal Medicine

## 2021-09-23 ENCOUNTER — Ambulatory Visit
Admission: RE | Admit: 2021-09-23 | Discharge: 2021-09-23 | Disposition: A | Discharge status: Home / Self Care | Payer: Medicaid Other | Attending: Internal Medicine | Admitting: Internal Medicine

## 2021-09-23 VITALS — BP 139/90 | HR 80 | Temp 97.8°F | Wt 180.0 lb

## 2021-09-23 DIAGNOSIS — Z6826 Body mass index (BMI) 26.0-26.9, adult: Secondary | ICD-10-CM | POA: Insufficient documentation

## 2021-09-23 DIAGNOSIS — M069 Rheumatoid arthritis, unspecified: Secondary | ICD-10-CM | POA: Diagnosis not present

## 2021-09-23 DIAGNOSIS — R0602 Shortness of breath: Secondary | ICD-10-CM | POA: Diagnosis not present

## 2021-09-23 DIAGNOSIS — E663 Overweight: Secondary | ICD-10-CM

## 2021-09-23 DIAGNOSIS — F17200 Nicotine dependence, unspecified, uncomplicated: Secondary | ICD-10-CM

## 2021-09-23 DIAGNOSIS — Z6828 Body mass index (BMI) 28.0-28.9, adult: Secondary | ICD-10-CM

## 2021-09-23 MED ORDER — ALBUTEROL SULFATE HFA 108 (90 BASE) MCG/ACT IN AERS
1.0000 | INHALATION_SPRAY | Freq: Four times a day (QID) | RESPIRATORY_TRACT | 0 refills | Status: DC | PRN
Start: 2021-09-23 — End: 2021-12-31

## 2021-09-23 NOTE — Assessment & Plan Note (Signed)
Encourage weight loss as being overweight can contribute to shortness of breath

## 2021-09-23 NOTE — Patient Instructions (Signed)
Falta de ar, adultos Shortness of Breath, Adult Falta de ar significa que voc tem problemas para respirar. A falta de ar pode ser um sinal de algum problema mdico. Siga estas instrues em casa: Poluio No fume nem use produtos que contenham nicotina ou tabaco. Se precisar de ajuda para parar de fumar, fale com seu mdico. Evite coisas que podem dificultar a respirao, tais como: Fumaa de todos os tipos. Isso inclui fumaa de fogueiras ou incndios florestais. No fume nem permita que qualquer outra pessoa fume em sua casa. Mofo. Poeira. Poluio do ar. Cheiros de substncias qumicas. Substncias que podem lhe causem reao alrgica (alrgenos), caso voc tenha alguma alergia. Mantenha sua casa limpa. Use produtos que ajudem a remover o mofo e a poeira. Instrues gerais Fique atento a eventuais alteraes em seus sintomas. Tome medicamentos vendidos com ou sem prescrio somente de acordo com as indicaes do seu mdico. Isso inclui oxigenoterapia e outros medicamento por via inalatria. Repouse conforme necessrio. Retorne s suas atividades normais quando seu mdico disser que  seguro. Comparea a todas as consultas de acompanhamento. Entre em contato com um mdico se: Seu quadro clnico no melhorar quando esperado. Tiver dificuldades para realizar atividades normais mesmo depois de repousar. Surgirem novos sintomas. No conseguir subir escadas. No conseguir fazer exerccios Architect. Busque ajuda imediatamente se: A falta de ar piorar. Tiver dificuldade para respirar quando em repouso. Sentir Biomedical scientist. Ocorrer tosse no aliviada por medicamentos. Tossir sangue. Sentir dor Geologist, engineering. Sentir dor no peito, braos, ombros ou barriga (abdome). Tiver febre. Esses sintomas podem ser uma emergncia. Obtenha ajuda imediatamente. Ligue para 911. No espere para ver se os sintomas desaparecem. No dirija por conta prpria at o  hospital. Resumo Falta de ar  quando voc tem dificuldade de respirar ar suficiente. Ela pode ser um sinal de um problema mdico. Evite coisas que atrapalhem a respirao, como fumar, poluio, mofo e poeira. Fique atento a eventuais alteraes em seus sintomas. Entre em contato com seu mdico se voc no Museum/gallery curator. Estas informaes no se destinam a substituir as recomendaes de seu mdico. No deixe de discutir quaisquer dvidas com seu mdico. Document Revised: 04/16/2021 Document Reviewed: 04/16/2021 Elsevier Patient Education  2022 [employer].

## 2021-09-23 NOTE — Progress Notes (Signed)
Subjective:    Patient ID: Noah Velasquez, male    DOB: 09-15-1965, 56 y.o.   MRN: [id number]  HPI  Patient presents to clinic today with complaint of shortness of breath. He reports this started a few months ago. He reports the shortness of breath is present all the time. The shortness of breath worsens with exertion but he denies chest pain, sweating, dizziness, nausea or vomiting. He reports intermittent cough. He smoked 2 packs daily for 10 years, 1/2 pack for 5 years and now he only has an occasional cigarette. Chest xray from 03/2018 reviewed without evidence of COPD. He did have childhood asthma but reports it has not bothered him as an adult. He was using Albuterol an inhaler about 1 x day with good relief of symptoms.  He would like a refill of this today.  He also feels like he could benefit from a nebulizer.  Review of Systems     Past Medical History:  Diagnosis Date   Chronic hip pain    Chronic pancreatitis (HCC)    CKD (chronic kidney disease) stage 3, GFR 30-59 ml/min (HCC)    GERD (gastroesophageal reflux disease)    Hyperlipidemia    Hypertension    Osteoarthritis    Pancreatitis    Pancreatitis    PTSD (post-traumatic stress disorder)    Viral infection of left eye     Current Outpatient Medications  Medication Sig Dispense Refill   amLODipine (NORVASC) 10 MG tablet Take 1 tablet (10 mg total) by mouth daily. 90 tablet 1   Blood Pressure KIT Please allow patient to chose from machines that are covered under insurance 1 kit 0   buPROPion (WELLBUTRIN XL) 150 MG 24 hr tablet Take 1 tablet (150 mg total) by mouth daily. 90 tablet 1   famotidine (PEPCID) 20 MG tablet Take 1 tablet (20 mg total) by mouth 2 (two) times daily. 180 tablet 1   gabapentin (NEURONTIN) 100 MG capsule Take 1 capsule (100 mg total) by mouth at bedtime as needed (sleep). 90 capsule 1   losartan (COZAAR) 100 MG tablet Take 1 tablet (100 mg total) by mouth daily. 90 tablet 1   naproxen (NAPROSYN)  500 MG tablet TAKE 1 TABLET (500 MG TOTAL) BY MOUTH 2 (TWO) TIMES DAILY AS NEEDED FOR MILD PAIN OR MODERATE PAIN. 30 tablet 1   nicotine (NICODERM CQ - DOSED IN MG/24 HOURS) 14 mg/24hr patch Place 1 patch (14 mg total) onto the skin daily. 14 patch 0   Olopatadine HCl 0.2 % SOLN APPLY 1 DROP TO EYE ONCE DAILY 2.5 mL 0   omeprazole (PRILOSEC) 20 MG capsule Take 1 capsule (20 mg total) by mouth daily. 90 capsule 1   Pancrelipase, Lip-Prot-Amyl, (ZENPEP) 78295-62130 units CPEP TAKE 1 CAPSULE BY MOUTH 3 (THREE) TIMES DAILY WITH MEALS. 270 capsule 1   traZODone (DESYREL) 150 MG tablet Take 1 tablet (150 mg total) by mouth at bedtime as needed for sleep. 30 tablet 5   No current facility-administered medications for this visit.    Allergies  Allergen Reactions   Hctz [Hydrochlorothiazide] Swelling    Family History  Problem Relation Age of Onset   Diabetes Mother    Sleep apnea Mother    Emphysema Sister    CAD Neg Hx    Mental illness Neg Hx     Social History   Socioeconomic History   Marital status: Legally Separated    Spouse name: Not on file   Number of children:  2   Years of education: Not on file   Highest education level: High school graduate  Occupational History   Occupation: on disability  Tobacco Use   Smoking status: Every Day    Packs/day: 0.25    Years: 15.00    Pack years: 3.75    Types: Cigarettes   Smokeless tobacco: Never  Vaping Use   Vaping Use: Never used  Substance and Sexual Activity   Alcohol use: Yes    Alcohol/week: 7.0 standard drinks    Types: 7 Cans of beer per week    Comment: weekly   Drug use: Yes    Types: Marijuana   Sexual activity: Yes    Birth control/protection: Condom  Other Topics Concern   Not on file  Social History Narrative   Not on file   Social Determinants of Health   Financial Resource Strain: Not on file  Food Insecurity: Not on file  Transportation Needs: Not on file  Physical Activity: Not on file  Stress:  Not on file  Social Connections: Not on file  Intimate Partner Violence: Not on file     Constitutional: Denies fever, malaise, fatigue, headache or abrupt weight changes.  HEENT: Denies eye pain, eye redness, ear pain, ringing in the ears, wax buildup, runny nose, nasal congestion, bloody nose, or sore throat. Respiratory: Patient reports shortness of breath at rest and with exertion, intermittent cough.  Denies difficulty breathing, or sputum production.   Cardiovascular: Denies chest pain, chest tightness, palpitations or swelling in the hands or feet.   No other specific complaints in a complete review of systems (except as listed in HPI above).  Objective:   Physical Exam BP 139/90 (BP Location: Left Arm, Patient Position: Sitting, Cuff Size: Large)    Pulse 80    Temp 97.8 F (36.6 C) (Temporal)    Wt 180 lb (81.6 kg) Comment: Per pt.   SpO2 95%    BMI 28.19 kg/m   Wt Readings from Last 3 Encounters:  06/11/21 162 lb 3.2 oz (73.6 kg)  01/23/21 172 lb 3.2 oz (78.1 kg)  06/06/20 179 lb 3.2 oz (81.3 kg)    General: Appears his stated age, chronically ill-appearing, in NAD. HEENT: Head: normal shape and size; Throat/Mouth: Teeth present, mucosa pink and moist, no exudate, lesions or ulcerations noted.  Cardiovascular: Normal rate and rhythm. S1,S2 noted.  No murmur, rubs or gallops noted. No JVD or BLE edema.  Pulmonary/Chest: Normal effort and positive vesicular breath sounds with intermittent expiratory wheeze noted. No respiratory distress. No rales or ronchi noted.  Neurological: Alert and oriented.   BMET    Component Value Date/Time   NA 134 (L) 06/06/2020 1141   K 4.1 06/06/2020 1141   CL 103 06/06/2020 1141   CO2 23 06/06/2020 1141   GLUCOSE 99 06/06/2020 1141   BUN 11 06/06/2020 1141   CREATININE 1.26 06/06/2020 1141   CALCIUM 9.1 06/06/2020 1141   GFRNONAA 64 06/06/2020 1141   GFRAA 74 06/06/2020 1141    Lipid Panel     Component Value Date/Time   CHOL 150  06/06/2020 1141   TRIG 59 06/06/2020 1141   HDL 48 06/06/2020 1141   CHOLHDL 3.1 06/06/2020 1141   VLDL 18 06/12/2018 1015   LDLCALC 88 06/06/2020 1141    CBC    Component Value Date/Time   WBC 7.8 06/06/2020 1141   RBC 4.40 06/06/2020 1141   HGB 14.1 06/06/2020 1141   HCT 41.7 06/06/2020 1141  PLT 307 06/06/2020 1141   MCV 94.8 06/06/2020 1141   MCH 32.0 06/06/2020 1141   MCHC 33.8 06/06/2020 1141   RDW 12.3 06/06/2020 1141   LYMPHSABS 2,075 06/06/2020 1141   MONOABS 0.8 12/13/2019 1028   EOSABS 476 06/06/2020 1141   BASOSABS 70 06/06/2020 1141    Hgb A1C Lab Results  Component Value Date   HGBA1C 5.2 01/10/2020           Assessment & Plan:   Shortness of Breath, Smoker:  He is concerned about COPD Chest x-ray today for further evaluation He is actively working on smoking cessation, encouraged him to continue with this Albuterol refilled today Will consider referral to pulmonology for PFT testing Advised him I cannot give him a nebulizer machine without the appropriate testing  We will follow-up after imaging with further recommendation and treatment plans Webb Silversmith, NP This visit occurred during the SARS-CoV-2 public health emergency.  Safety protocols were in place, including screening questions prior to the visit, additional usage of staff PPE, and extensive cleaning of exam room while observing appropriate contact time as indicated for disinfecting solutions.

## 2021-09-24 DIAGNOSIS — F3110 Bipolar disorder, current episode manic without psychotic features, unspecified: Secondary | ICD-10-CM | POA: Diagnosis not present

## 2021-09-24 DIAGNOSIS — M069 Rheumatoid arthritis, unspecified: Secondary | ICD-10-CM | POA: Diagnosis not present

## 2021-09-25 ENCOUNTER — Telehealth: Payer: Self-pay

## 2021-09-25 DIAGNOSIS — J849 Interstitial pulmonary disease, unspecified: Secondary | ICD-10-CM

## 2021-09-25 DIAGNOSIS — M069 Rheumatoid arthritis, unspecified: Secondary | ICD-10-CM | POA: Diagnosis not present

## 2021-09-25 NOTE — Telephone Encounter (Signed)
Referral placed.

## 2021-09-25 NOTE — Telephone Encounter (Signed)
Pt advised. Please refer to pulmonology, pt states he needs at least one week notice to arrange transportation.   Thanks,   -Mickel Baas

## 2021-09-25 NOTE — Telephone Encounter (Signed)
-----   Message from Jearld Fenton, NP sent at 09/24/2021  5:44 PM EST ----- His chest x-ray shows finding consistent with interstitial lung disease.  I need him to follow-up with pulmonology regarding this.  Let me know if he is agreeable and I will place referral.

## 2021-09-26 DIAGNOSIS — M069 Rheumatoid arthritis, unspecified: Secondary | ICD-10-CM | POA: Diagnosis not present

## 2021-09-29 ENCOUNTER — Telehealth: Payer: Self-pay | Admitting: Internal Medicine

## 2021-09-29 DIAGNOSIS — M069 Rheumatoid arthritis, unspecified: Secondary | ICD-10-CM | POA: Diagnosis not present

## 2021-09-29 DIAGNOSIS — F3110 Bipolar disorder, current episode manic without psychotic features, unspecified: Secondary | ICD-10-CM | POA: Diagnosis not present

## 2021-09-30 ENCOUNTER — Telehealth: Payer: Self-pay | Admitting: Internal Medicine

## 2021-09-30 DIAGNOSIS — M069 Rheumatoid arthritis, unspecified: Secondary | ICD-10-CM | POA: Diagnosis not present

## 2021-09-30 NOTE — Telephone Encounter (Signed)
Patient called in says losartan and lorataidine  brings his bp up so he stopped taking it, and took mustard and it has brought his bo down.

## 2021-09-30 NOTE — Telephone Encounter (Signed)
Requested medication (s) are due for refill today: No  Requested medication (s) are on the active medication list: No  Last refill:  Unknown  Future visit scheduled: No  Notes to clinic:  Medication not on list.    Requested Prescriptions  Pending Prescriptions Disp Refills   sertraline (ZOLOFT) 100 MG tablet [Pharmacy Med Name: SERTRALINE HCL 100 MG TAB] 90 tablet     Sig: TAKE 1 TABLET BY MOUTH ONCE DAILY     Not Delegated - Psychiatry:  Antidepressants - SSRI - sertraline Failed - 09/29/2021 12:06 PM      Failed - This refill cannot be delegated      Failed - AST in normal range and within 360 days    AST  Date Value Ref Range Status  06/06/2020 16 10 - 35 U/L Final          Failed - ALT in normal range and within 360 days    ALT  Date Value Ref Range Status  06/06/2020 9 9 - 46 U/L Final          Passed - Completed PHQ-2 or PHQ-9 in the last 360 days      Passed - Valid encounter within last 6 months    Recent Outpatient Visits           1 week ago Shortness of breath   [facility] [place], Coralie Keens, NP   3 months ago [facility] [place], Coralie Keens, NP   8 months ago Posttraumatic stress disorder   [facility] [place], Coralie Keens, NP   1 year ago Essential hypertension   [place], FNP   1 year ago Left testicular pain   [facility], Lupita Raider, [place]

## 2021-10-01 DIAGNOSIS — M069 Rheumatoid arthritis, unspecified: Secondary | ICD-10-CM | POA: Diagnosis not present

## 2021-10-01 NOTE — Telephone Encounter (Signed)
Have him follow-up in the office for BP check

## 2021-10-01 NOTE — Telephone Encounter (Signed)
Apt scheduled for 10/09/2021 at 10am  Thanks,   -Mickel Baas

## 2021-10-02 DIAGNOSIS — M069 Rheumatoid arthritis, unspecified: Secondary | ICD-10-CM | POA: Diagnosis not present

## 2021-10-03 DIAGNOSIS — M069 Rheumatoid arthritis, unspecified: Secondary | ICD-10-CM | POA: Diagnosis not present

## 2021-10-06 DIAGNOSIS — F3110 Bipolar disorder, current episode manic without psychotic features, unspecified: Secondary | ICD-10-CM | POA: Diagnosis not present

## 2021-10-06 DIAGNOSIS — M069 Rheumatoid arthritis, unspecified: Secondary | ICD-10-CM | POA: Diagnosis not present

## 2021-10-07 DIAGNOSIS — M069 Rheumatoid arthritis, unspecified: Secondary | ICD-10-CM | POA: Diagnosis not present

## 2021-10-07 NOTE — Telephone Encounter (Signed)
Pt called in to follow up on his refill request for sertraline (ZOLOFT) 100 MG tablet [Pharmacy Med Name: SERTRALINE HCL 100 MG TAB] pt says that he is completely out.   Pharmacy:  [place], [place]. Phone:  [phone]  Fax:  [phone]

## 2021-10-08 DIAGNOSIS — M069 Rheumatoid arthritis, unspecified: Secondary | ICD-10-CM | POA: Diagnosis not present

## 2021-10-08 NOTE — Telephone Encounter (Signed)
Please review.  Pt was scheduled for a BP check tomorrow but he has canceled.  It is okay to refill sertraline?  Thanks,   -Mickel Baas

## 2021-10-08 NOTE — Telephone Encounter (Signed)
Can refill for 30 days. He was due for CPE in 07/2021. He needs to get this scheduled.

## 2021-10-09 ENCOUNTER — Ambulatory Visit: Payer: Medicaid Other | Admitting: Internal Medicine

## 2021-10-09 DIAGNOSIS — M069 Rheumatoid arthritis, unspecified: Secondary | ICD-10-CM | POA: Diagnosis not present

## 2021-10-09 MED ORDER — SERTRALINE HCL 100 MG PO TABS: 100.0000 mg | ORAL_TABLET | Freq: Every day | ORAL | 0 refills | Status: DC

## 2021-10-09 NOTE — Telephone Encounter (Signed)
RX sent to the pharmacy with a note to schedule a physical.   Thanks,   -Mickel Baas

## 2021-10-09 NOTE — Addendum Note (Signed)
Addended by: Ashley Royalty E on: 10/09/2021 10:54 AM   Modules accepted: Orders

## 2021-10-10 DIAGNOSIS — M069 Rheumatoid arthritis, unspecified: Secondary | ICD-10-CM | POA: Diagnosis not present

## 2021-10-13 DIAGNOSIS — M069 Rheumatoid arthritis, unspecified: Secondary | ICD-10-CM | POA: Diagnosis not present

## 2021-10-13 DIAGNOSIS — F3110 Bipolar disorder, current episode manic without psychotic features, unspecified: Secondary | ICD-10-CM | POA: Diagnosis not present

## 2021-10-14 DIAGNOSIS — M069 Rheumatoid arthritis, unspecified: Secondary | ICD-10-CM | POA: Diagnosis not present

## 2021-10-15 DIAGNOSIS — M069 Rheumatoid arthritis, unspecified: Secondary | ICD-10-CM | POA: Diagnosis not present

## 2021-10-16 ENCOUNTER — Other Ambulatory Visit: Payer: Self-pay | Admitting: Internal Medicine

## 2021-10-16 DIAGNOSIS — M069 Rheumatoid arthritis, unspecified: Secondary | ICD-10-CM | POA: Diagnosis not present

## 2021-10-16 DIAGNOSIS — H0520 Unspecified exophthalmos: Secondary | ICD-10-CM

## 2021-10-16 NOTE — Telephone Encounter (Signed)
Refill available. LRF 06/11/21 #90  1 refill ?Requested Prescriptions  ?Pending Prescriptions Disp Refills  ?? amLODipine (NORVASC) 10 MG tablet [Pharmacy Med Name: AMLODIPINE BESYLATE 10 MG TAB] 90 tablet 1  ?  Sig: TAKE 1 TABLET BY MOUTH ONCE DAILY  ?  ? Cardiovascular: Calcium Channel Blockers 2 Failed - 10/16/2021 11:31 AM  ?  ?  Failed - Last BP in normal range  ?  BP Readings from Last 1 Encounters:  ?09/23/21 139/90  ?   ?  ?  Passed - Last Heart Rate in normal range  ?  Pulse Readings from Last 1 Encounters:  ?09/23/21 80  ?   ?  ?  Passed - Valid encounter within last 6 months  ?  Recent Outpatient Visits   ?      ? 3 weeks ago Shortness of breath  ? [facility] [place], Coralie Keens, NP  ? 4 months ago Exophthalmus  ? [facility] [place], [place] W, NP  ? 8 months ago Posttraumatic stress disorder  ? [facility] [place], Coralie Keens, NP  ? 1 year ago Essential hypertension  ? [place], FNP  ? 1 year ago Left testicular pain  ? [facility], Lupita Raider, FNP  ?  ?  ? ?  ?  ?  ? ? ?

## 2021-10-17 ENCOUNTER — Other Ambulatory Visit: Payer: Self-pay | Admitting: Internal Medicine

## 2021-10-17 DIAGNOSIS — M069 Rheumatoid arthritis, unspecified: Secondary | ICD-10-CM | POA: Diagnosis not present

## 2021-10-17 DIAGNOSIS — H0520 Unspecified exophthalmos: Secondary | ICD-10-CM

## 2021-10-17 NOTE — Telephone Encounter (Signed)
Medication discontinued 06/08/21 ?Requested Prescriptions  ?Signed Prescriptions Disp Refills  ? omeprazole (PRILOSEC) 20 MG capsule 90 capsule 1  ?  Sig: TAKE 1 CAPSULE BY MOUTH ONCE DAILY  ?  ? Gastroenterology: Proton Pump Inhibitors Passed - 10/17/2021 12:07 PM  ?  ?  Passed - Valid encounter within last 12 months  ?  Recent Outpatient Visits   ?      ? 3 weeks ago Shortness of breath  ? [facility] [place], Coralie Keens, NP  ? 4 months ago Exophthalmus  ? [facility] [place], [place] W, NP  ? 8 months ago Posttraumatic stress disorder  ? [facility] [place], Coralie Keens, NP  ? 1 year ago Essential hypertension  ? [place], FNP  ? 1 year ago Left testicular pain  ? [facility], Lupita Raider, FNP  ?  ?  ? ?  ?  ?  ?Refused Prescriptions Disp Refills  ?? fluticasone (FLONASE) 50 MCG/ACT nasal spray [Pharmacy Med Name: FLUTICASONE PROPIONATE 50 MCG/ACT N] 16 g   ?  Sig: PLACE 1 SPRAY INTO BOTH NOSTRILS ONCE DAILY AS NEEDED FOR ALLERGIES  ?  ? Not Delegated - Ear, Nose, and Throat: Nasal Preparations - Corticosteroids Failed - 10/17/2021 12:07 PM  ?  ?  Failed - This refill cannot be delegated  ?  ?  Passed - Valid encounter within last 12 months  ?  Recent Outpatient Visits   ?      ? 3 weeks ago Shortness of breath  ? [facility] [place], Coralie Keens, NP  ? 4 months ago Exophthalmus  ? [facility] [place], [place] W, NP  ? 8 months ago Posttraumatic stress disorder  ? [facility] [place], Coralie Keens, NP  ? 1 year ago Essential hypertension  ? [place], FNP  ? 1 year ago Left testicular pain  ? [facility], Lupita Raider, FNP  ?  ?  ? ?  ?  ?  ? ?

## 2021-10-17 NOTE — Telephone Encounter (Signed)
Requested Prescriptions  ?Pending Prescriptions Disp Refills  ?? omeprazole (PRILOSEC) 20 MG capsule [Pharmacy Med Name: OMEPRAZOLE DR 20 MG CAP] 90 capsule 1  ?  Sig: TAKE 1 CAPSULE BY MOUTH ONCE DAILY  ?  ? Gastroenterology: Proton Pump Inhibitors Passed - 10/17/2021 12:07 PM  ?  ?  Passed - Valid encounter within last 12 months  ?  Recent Outpatient Visits   ?      ? 3 weeks ago Shortness of breath  ? [facility] [place], Coralie Keens, NP  ? 4 months ago Exophthalmus  ? [facility] [place], [place] W, NP  ? 8 months ago Posttraumatic stress disorder  ? [facility] [place], Coralie Keens, NP  ? 1 year ago Essential hypertension  ? [place], FNP  ? 1 year ago Left testicular pain  ? [place], FNP  ?  ?  ? ?  ?  ?  ?? fluticasone (FLONASE) 50 MCG/ACT nasal spray [Pharmacy Med Name: FLUTICASONE PROPIONATE 50 MCG/ACT N] 16 g   ?  Sig: PLACE 1 SPRAY INTO BOTH NOSTRILS ONCE DAILY AS NEEDED FOR ALLERGIES  ?  ? Not Delegated - Ear, Nose, and Throat: Nasal Preparations - Corticosteroids Failed - 10/17/2021 12:07 PM  ?  ?  Failed - This refill cannot be delegated  ?  ?  Passed - Valid encounter within last 12 months  ?  Recent Outpatient Visits   ?      ? 3 weeks ago Shortness of breath  ? [facility] [place], Coralie Keens, NP  ? 4 months ago Exophthalmus  ? [facility] [place], [place] W, NP  ? 8 months ago Posttraumatic stress disorder  ? [facility] [place], Coralie Keens, NP  ? 1 year ago Essential hypertension  ? [place], FNP  ? 1 year ago Left testicular pain  ? [facility], Lupita Raider, FNP  ?  ?  ? ?  ?  ?  ? ?

## 2021-10-20 DIAGNOSIS — F3110 Bipolar disorder, current episode manic without psychotic features, unspecified: Secondary | ICD-10-CM | POA: Diagnosis not present

## 2021-10-20 DIAGNOSIS — M069 Rheumatoid arthritis, unspecified: Secondary | ICD-10-CM | POA: Diagnosis not present

## 2021-10-21 DIAGNOSIS — M069 Rheumatoid arthritis, unspecified: Secondary | ICD-10-CM | POA: Diagnosis not present

## 2021-10-22 DIAGNOSIS — M069 Rheumatoid arthritis, unspecified: Secondary | ICD-10-CM | POA: Diagnosis not present

## 2021-10-23 DIAGNOSIS — M069 Rheumatoid arthritis, unspecified: Secondary | ICD-10-CM | POA: Diagnosis not present

## 2021-10-24 DIAGNOSIS — M069 Rheumatoid arthritis, unspecified: Secondary | ICD-10-CM | POA: Diagnosis not present

## 2021-10-27 DIAGNOSIS — M069 Rheumatoid arthritis, unspecified: Secondary | ICD-10-CM | POA: Diagnosis not present

## 2021-10-28 DIAGNOSIS — M069 Rheumatoid arthritis, unspecified: Secondary | ICD-10-CM | POA: Diagnosis not present

## 2021-10-28 DIAGNOSIS — F3110 Bipolar disorder, current episode manic without psychotic features, unspecified: Secondary | ICD-10-CM | POA: Diagnosis not present

## 2021-10-29 DIAGNOSIS — M069 Rheumatoid arthritis, unspecified: Secondary | ICD-10-CM | POA: Diagnosis not present

## 2021-10-30 DIAGNOSIS — M069 Rheumatoid arthritis, unspecified: Secondary | ICD-10-CM | POA: Diagnosis not present

## 2021-10-31 DIAGNOSIS — M069 Rheumatoid arthritis, unspecified: Secondary | ICD-10-CM | POA: Diagnosis not present

## 2021-11-03 DIAGNOSIS — M069 Rheumatoid arthritis, unspecified: Secondary | ICD-10-CM | POA: Diagnosis not present

## 2021-11-04 DIAGNOSIS — M069 Rheumatoid arthritis, unspecified: Secondary | ICD-10-CM | POA: Diagnosis not present

## 2021-11-04 DIAGNOSIS — F3110 Bipolar disorder, current episode manic without psychotic features, unspecified: Secondary | ICD-10-CM | POA: Diagnosis not present

## 2021-11-05 DIAGNOSIS — M069 Rheumatoid arthritis, unspecified: Secondary | ICD-10-CM | POA: Diagnosis not present

## 2021-11-06 DIAGNOSIS — M069 Rheumatoid arthritis, unspecified: Secondary | ICD-10-CM | POA: Diagnosis not present

## 2021-11-07 DIAGNOSIS — M069 Rheumatoid arthritis, unspecified: Secondary | ICD-10-CM | POA: Diagnosis not present

## 2021-11-09 ENCOUNTER — Other Ambulatory Visit: Payer: Self-pay | Admitting: Internal Medicine

## 2021-11-10 DIAGNOSIS — F3110 Bipolar disorder, current episode manic without psychotic features, unspecified: Secondary | ICD-10-CM | POA: Diagnosis not present

## 2021-11-10 NOTE — Telephone Encounter (Signed)
Pt called to report that he is completely out and needs his refill soon, please advise  ?

## 2021-11-11 DIAGNOSIS — M069 Rheumatoid arthritis, unspecified: Secondary | ICD-10-CM | POA: Diagnosis not present

## 2021-11-11 NOTE — Telephone Encounter (Signed)
Requested medication (s) are due for refill today: yes ? ?Requested medication (s) are on the active medication list: yes ? ?Last refill:  10/09/21 #30/0 ? ?Future visit scheduled: no ? ?Notes to clinic:  pt is due for an appt. Rx is not delegated as well.  ? ? ?  ?Requested Prescriptions  ?Pending Prescriptions Disp Refills  ? sertraline (ZOLOFT) 100 MG tablet [Pharmacy Med Name: SERTRALINE HCL 100 MG TAB] 30 tablet 0  ?  Sig: TAKE 1 TABLET BY MOUTH ONCE DAILY. *PLEASE MAKE APPOINTMENT FOR FURTHER FILLS*  ?  ? Not Delegated - Psychiatry:  Antidepressants - SSRI - sertraline Failed - 11/10/2021  1:08 PM  ?  ?  Failed - This refill cannot be delegated  ?  ?  Failed - AST in normal range and within 360 days  ?  AST  ?Date Value Ref Range Status  ?06/06/2020 16 10 - 35 U/L Final  ?  ?  ?  ?  Failed - ALT in normal range and within 360 days  ?  ALT  ?Date Value Ref Range Status  ?06/06/2020 9 9 - 46 U/L Final  ?  ?  ?  ?  Passed - Completed PHQ-2 or PHQ-9 in the last 360 days  ?  ?  Passed - Valid encounter within last 6 months  ?  Recent Outpatient Visits   ? ?      ? 1 month ago Shortness of breath  ? [facility] [place], Coralie Keens, NP  ? 5 months ago Exophthalmus  ? [facility] [place], [place] W, NP  ? 9 months ago Posttraumatic stress disorder  ? [facility] [place], Coralie Keens, NP  ? 1 year ago Essential hypertension  ? [place], FNP  ? 1 year ago Left testicular pain  ? [facility], Lupita Raider, FNP  ? ?  ?  ? ?  ?  ?  ? ?

## 2021-11-11 NOTE — Telephone Encounter (Signed)
Requested medication (s) are due for refill today: yes ? ?Requested medication (s) are on the active medication list: yes   ? ?Last refill: 10/09/21  #30  0 refills ? ?Future visit scheduled no ? ?Notes to clinic: Not delegated, labs due. Please review. Thank you. ? ?Requested Prescriptions  ?Pending Prescriptions Disp Refills  ? sertraline (ZOLOFT) 100 MG tablet [Pharmacy Med Name: SERTRALINE HCL 100 MG TAB] 30 tablet 0  ?  Sig: TAKE 1 TABLET BY MOUTH ONCE DAILY. *PLEASE MAKE APPOINTMENT FOR FURTHER FILLS*  ?  ? Not Delegated - Psychiatry:  Antidepressants - SSRI - sertraline Failed - 11/10/2021  1:08 PM  ?  ?  Failed - This refill cannot be delegated  ?  ?  Failed - AST in normal range and within 360 days  ?  AST  ?Date Value Ref Range Status  ?06/06/2020 16 10 - 35 U/L Final  ?  ?  ?  ?  Failed - ALT in normal range and within 360 days  ?  ALT  ?Date Value Ref Range Status  ?06/06/2020 9 9 - 46 U/L Final  ?  ?  ?  ?  Passed - Completed PHQ-2 or PHQ-9 in the last 360 days  ?  ?  Passed - Valid encounter within last 6 months  ?  Recent Outpatient Visits   ? ?      ? 1 month ago Shortness of breath  ? [facility] [place], Coralie Keens, NP  ? 5 months ago Exophthalmus  ? [facility] [place], [place] W, NP  ? 9 months ago Posttraumatic stress disorder  ? [facility] [place], Coralie Keens, NP  ? 1 year ago Essential hypertension  ? [place], FNP  ? 1 year ago Left testicular pain  ? [facility], Lupita Raider, FNP  ? ?  ?  ? ?  ?  ?  ? ? ? ? ?

## 2021-11-11 NOTE — Telephone Encounter (Signed)
Patient called, left VM to return the call to the office to scheduled an appt for medication refill request.   

## 2021-11-12 DIAGNOSIS — M069 Rheumatoid arthritis, unspecified: Secondary | ICD-10-CM | POA: Diagnosis not present

## 2021-11-13 DIAGNOSIS — M069 Rheumatoid arthritis, unspecified: Secondary | ICD-10-CM | POA: Diagnosis not present

## 2021-11-14 DIAGNOSIS — M069 Rheumatoid arthritis, unspecified: Secondary | ICD-10-CM | POA: Diagnosis not present

## 2021-11-17 DIAGNOSIS — F3110 Bipolar disorder, current episode manic without psychotic features, unspecified: Secondary | ICD-10-CM | POA: Diagnosis not present

## 2021-11-20 ENCOUNTER — Institutional Professional Consult (permissible substitution): Payer: Medicaid Other | Admitting: Pulmonary Disease

## 2021-11-26 DIAGNOSIS — F3110 Bipolar disorder, current episode manic without psychotic features, unspecified: Secondary | ICD-10-CM | POA: Diagnosis not present

## 2021-12-03 DIAGNOSIS — F3110 Bipolar disorder, current episode manic without psychotic features, unspecified: Secondary | ICD-10-CM | POA: Diagnosis not present

## 2021-12-04 DIAGNOSIS — M069 Rheumatoid arthritis, unspecified: Secondary | ICD-10-CM | POA: Diagnosis not present

## 2021-12-08 ENCOUNTER — Encounter: Payer: Self-pay | Admitting: Emergency Medicine

## 2021-12-08 ENCOUNTER — Emergency Department: Payer: Medicaid Other

## 2021-12-08 ENCOUNTER — Emergency Department
Admission: EM | Admit: 2021-12-08 | Discharge: 2021-12-08 | Disposition: A | Discharge status: Home / Self Care | Payer: Medicaid Other | Attending: Emergency Medicine | Admitting: Emergency Medicine

## 2021-12-08 ENCOUNTER — Other Ambulatory Visit: Payer: Self-pay

## 2021-12-08 DIAGNOSIS — R1013 Epigastric pain: Secondary | ICD-10-CM | POA: Diagnosis not present

## 2021-12-08 DIAGNOSIS — F17200 Nicotine dependence, unspecified, uncomplicated: Secondary | ICD-10-CM | POA: Insufficient documentation

## 2021-12-08 DIAGNOSIS — J189 Pneumonia, unspecified organism: Secondary | ICD-10-CM | POA: Diagnosis not present

## 2021-12-08 DIAGNOSIS — K862 Cyst of pancreas: Secondary | ICD-10-CM | POA: Diagnosis not present

## 2021-12-08 DIAGNOSIS — I10 Essential (primary) hypertension: Secondary | ICD-10-CM | POA: Diagnosis not present

## 2021-12-08 DIAGNOSIS — J449 Chronic obstructive pulmonary disease, unspecified: Secondary | ICD-10-CM | POA: Diagnosis not present

## 2021-12-08 DIAGNOSIS — R0602 Shortness of breath: Secondary | ICD-10-CM | POA: Diagnosis not present

## 2021-12-08 LAB — CBC WITH DIFFERENTIAL/PLATELET
Abs Immature Granulocytes: 0.03 10*3/uL (ref 0.00–0.07)
Basophils Absolute: 0.1 10*3/uL (ref 0.0–0.1)
Basophils Relative: 1 %
Eosinophils Absolute: 0 10*3/uL (ref 0.0–0.5)
Eosinophils Relative: 0 %
HCT: 45.8 % (ref 39.0–52.0)
Hemoglobin: 15.2 g/dL (ref 13.0–17.0)
Immature Granulocytes: 0 %
Lymphocytes Relative: 27 %
Lymphs Abs: 2.4 10*3/uL (ref 0.7–4.0)
MCH: 30.6 pg (ref 26.0–34.0)
MCHC: 33.2 g/dL (ref 30.0–36.0)
MCV: 92.2 fL (ref 80.0–100.0)
Monocytes Absolute: 0.5 10*3/uL (ref 0.1–1.0)
Monocytes Relative: 6 %
Neutro Abs: 5.8 10*3/uL (ref 1.7–7.7)
Neutrophils Relative %: 66 %
Platelets: 317 10*3/uL (ref 150–400)
RBC: 4.97 MIL/uL (ref 4.22–5.81)
RDW: 13.2 % (ref 11.5–15.5)
WBC: 8.8 10*3/uL (ref 4.0–10.5)
nRBC: 0 % (ref 0.0–0.2)

## 2021-12-08 LAB — COMPREHENSIVE METABOLIC PANEL
ALT: 16 U/L (ref 0–44)
AST: 29 U/L (ref 15–41)
Albumin: 3.7 g/dL (ref 3.5–5.0)
Alkaline Phosphatase: 81 U/L (ref 38–126)
Anion gap: 10 (ref 5–15)
BUN: 12 mg/dL (ref 6–20)
CO2: 23 mmol/L (ref 22–32)
Calcium: 9.1 mg/dL (ref 8.9–10.3)
Chloride: 96 mmol/L — ABNORMAL LOW (ref 98–111)
Creatinine, Ser: 0.94 mg/dL (ref 0.61–1.24)
GFR, Estimated: >60 mL/min (ref >60)
Glucose, Bld: 119 mg/dL — ABNORMAL HIGH (ref 70–99)
Potassium: 3.5 mmol/L (ref 3.5–5.1)
Sodium: 129 mmol/L — ABNORMAL LOW (ref 135–145)
Total Bilirubin: 0.8 mg/dL (ref 0.3–1.2)
Total Protein: 10.5 g/dL — ABNORMAL HIGH (ref 6.5–8.1)

## 2021-12-08 LAB — URINALYSIS, ROUTINE W REFLEX MICROSCOPIC
Bilirubin Urine: NEGATIVE
Glucose, UA: NEGATIVE mg/dL
Hgb urine dipstick: NEGATIVE
Ketones, ur: 5 mg/dL — AB
Leukocytes,Ua: NEGATIVE
Nitrite: NEGATIVE
Protein, ur: NEGATIVE mg/dL
Specific Gravity, Urine: 1.008 (ref 1.005–1.030)
pH: 7 (ref 5.0–8.0)

## 2021-12-08 LAB — LIPASE, BLOOD: Lipase: 31 U/L (ref 11–51)

## 2021-12-08 LAB — TROPONIN I (HIGH SENSITIVITY): Troponin I (High Sensitivity): 24 ng/L — ABNORMAL HIGH (ref <18)

## 2021-12-08 MED ORDER — AZITHROMYCIN 250 MG PO TABS: ORAL_TABLET | ORAL | 0 refills | Status: DC

## 2021-12-08 MED ORDER — OXYCODONE-ACETAMINOPHEN 5-325 MG PO TABS
1.0000 | ORAL_TABLET | ORAL | Status: DC | PRN
  Administered 2021-12-08: 1 via ORAL
  Filled 2021-12-08 (×2): qty 1

## 2021-12-08 MED ORDER — IOHEXOL 300 MG/ML  SOLN
100.0000 mL | Freq: Once | INTRAMUSCULAR | Status: AC | PRN
  Administered 2021-12-08: 100 mL via INTRAVENOUS
  Filled 2021-12-08: qty 100

## 2021-12-08 MED ORDER — OXYCODONE-ACETAMINOPHEN 5-325 MG PO TABS
1.0000 | ORAL_TABLET | Freq: Once | ORAL | Status: AC
  Administered 2021-12-08: 1 via ORAL
  Filled 2021-12-08: qty 1

## 2021-12-08 MED ORDER — AZITHROMYCIN 500 MG PO TABS
500.0000 mg | ORAL_TABLET | Freq: Once | ORAL | Status: AC
Start: 2021-12-08 — End: 2021-12-08
  Administered 2021-12-08: 500 mg via ORAL
  Filled 2021-12-08: qty 1

## 2021-12-08 MED ORDER — MORPHINE SULFATE (PF) 4 MG/ML IV SOLN
4.0000 mg | Freq: Once | INTRAVENOUS | Status: AC
  Administered 2021-12-08: 4 mg via INTRAVENOUS
  Filled 2021-12-08: qty 1

## 2021-12-08 MED ORDER — ONDANSETRON HCL 4 MG/2ML IJ SOLN
4.0000 mg | Freq: Once | INTRAMUSCULAR | Status: AC
  Administered 2021-12-08: 4 mg via INTRAVENOUS
  Filled 2021-12-08: qty 2

## 2021-12-08 MED ORDER — SODIUM CHLORIDE 0.9 % IV BOLUS
1000.0000 mL | Freq: Once | INTRAVENOUS | Status: AC
  Administered 2021-12-08: 1000 mL via INTRAVENOUS

## 2021-12-08 NOTE — ED Provider Triage Note (Signed)
Emergency Medicine Provider Triage Evaluation Note ? ?Noah Velasquez , a 56 y.o. male  was evaluated in triage.  Pt complains of complains of mid back pain along his kidneys.  Urine has been darker.  Is walking with a cane.. ? ?Review of Systems  ?Positive:  ?Negative:  ? ?Physical Exam  ?BP (!) 165/102   Pulse 89   Temp 98.1 ?F (36.7 ?C) (Oral)   Resp 20   Ht '5\' 7"'$  (1.702 m)   Wt 80.7 kg   SpO2 100%   BMI 27.88 kg/m?  ?Gen:   Awake, no distress   ?Resp:  Normal effort  ?MSK:   Moves extremities without difficulty  ?Other:   ? ?Medical Decision Making  ?Medically screening exam initiated at 5:15 PM.  Appropriate orders placed.  Gerod Caligiuri was informed that the remainder of the evaluation will be completed by another provider, this initial triage assessment does not replace that evaluation, and the importance of remaining in the ED until their evaluation is complete. ? ?UA ordered ?  ?Versie Starks, PA-C ?12/08/21 1715 ? ?

## 2021-12-08 NOTE — ED Notes (Signed)
See triage note  presents with back pain which started this am   pain is to mid back  states pain was 10/10   unable to lay still d/t pain ?

## 2021-12-08 NOTE — ED Notes (Addendum)
iV started  meds given.  Siderails up x 2 ?

## 2021-12-08 NOTE — ED Triage Notes (Addendum)
First Nurse Note;  ?Pt via EMS from home. Pt c/o mid back pain. Pt also c/o SOB, EMS placed on pt placed on 2L [place] with EMS for comfort. Pt on RA at this time. Pt states pain started this AM. Pt is A&OX4 and NAD ? ?182/104 with hx of HTN.  ?88 HR ?97% on 2L [place]  ?

## 2021-12-08 NOTE — ED Provider Notes (Addendum)
? ?[facility] ?Provider Note ? ?Patient Contact: 7:32 PM (approximate) ? ? ?History  ? ?Back Pain ? ? ?HPI ? ?Noah Velasquez is a 56 y.o. male who presents the emergency department complaining of epigastric abdominal pain radiating into the back.  Patient has felt short of breath though he states that he has chronic shortness of breath.  Patient did endorse increased alcohol use over the weekend prior to the onset of symptoms.  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  Patient denies any nausea, vomiting, diarrhea.  No urinary symptoms. ?  ? ? ?Physical Exam  ? ?Triage Vital Signs: ?ED Triage Vitals  ?Enc Vitals Group  ?   BP 12/08/21 1623 (!) 165/102  ?   Pulse Rate 12/08/21 1623 89  ?   Resp 12/08/21 1623 20  ?   Temp 12/08/21 1623 98.1 ?F (36.7 ?C)  ?   Temp Source 12/08/21 1623 Oral  ?   SpO2 12/08/21 1623 100 %  ?   Weight 12/08/21 1620 178 lb (80.7 kg)  ?   Height 12/08/21 1620 '5\' 7"'$  (1.702 m)  ?   Head Circumference --   ?   Peak Flow --   ?   Pain Score 12/08/21 1620 10  ?   Pain Loc --   ?   Pain Edu? --   ?   Excl. in [place]? --   ? ? ?Most recent vital signs: ?Vitals:  ? 12/08/21 1623 12/08/21 1804  ?BP: (!) 165/102 (!) 160/99  ?Pulse: 89 90  ?Resp: 20 20  ?Temp: 98.1 ?F (36.7 ?C)   ?SpO2: 100% 100%  ? ? ? ?General: Alert and in no acute distress.  ?Neck: No stridor. No cervical spine tenderness to palpation. ?Hematological/Lymphatic/Immunilogical: No cervical lymphadenopathy. ?Cardiovascular:  Good peripheral perfusion ?Respiratory: Normal respiratory effort without tachypnea or retractions. Lungs CTAB. Good air entry to the bases with no decreased or absent breath sounds. ?Gastrointestinal: Bowel sounds ?4 quadrants.  Soft to palpation all quadrants.  Tender in the epigastric region.  No other significant tenderness to palpation.  No guarding or rigidity. No palpable masses. No distention. No CVA  tenderness. ?Musculoskeletal: Full range of motion to all extremities.  ?Neurologic:  No gross focal neurologic deficits are appreciated.  ?Skin:   No rash noted ?Other: ? ? ?ED Results / Procedures / Treatments  ? ?Labs ?(all labs ordered are listed, but only abnormal results are displayed) ?Labs Reviewed  ?URINALYSIS, ROUTINE W REFLEX MICROSCOPIC - Abnormal; Notable for the following components:  ?    Result Value  ? Color, Urine STRAW (*)   ? APPearance CLEAR (*)   ? Ketones, ur 5 (*)   ? All other components within normal limits  ?COMPREHENSIVE METABOLIC PANEL - Abnormal; Notable for the following components:  ? Sodium 129 (*)   ? Chloride 96 (*)   ? Glucose, Bld 119 (*)   ? Total Protein 10.5 (*)   ? All other components within normal limits  ?TROPONIN I (HIGH SENSITIVITY) - Abnormal; Notable for the following components:  ? Troponin I (High Sensitivity) 24 (*)   ? All other components within normal limits  ?CBC WITH DIFFERENTIAL/PLATELET  ?LIPASE, BLOOD  ? ? ? ?EKG ? ? ? ? ?RADIOLOGY ? ?I personally viewed and evaluated these images as part of my medical decision making, as well as reviewing the written report by the radiologist. ? ?ED Provider  Interpretation: Hazy opacities suspicious for pneumonia patient's cough reports shortness of breath.  Differential includes atelectasis with this finding.  CT scan revealed what appears to be a pseudocyst around the pancreas without signs of acute pancreatitis.  No other significant acute findings on CT scan ? ?DG Chest 2 View ? ?Result Date: 12/08/2021 ?CLINICAL DATA:  Shortness of breath EXAM: CHEST - 2 VIEW COMPARISON:  09/23/2021, 04/04/2018 FINDINGS: Mild diffuse reticular interstitial opacity consistent with chronic lung disease. Increased airspace disease at both bases suspicious for superimposed atelectasis or mild pneumonia. No pleural effusion. Normal cardiac size with aortic atherosclerosis. IMPRESSION: Evidence of underlying chronic lung disease. New hazy  basilar opacities suspicious for superimposed atelectasis or pneumonia Electronically Signed   By: Donavan Foil M.D.   On: 12/08/2021 20:26  ? ?CT ABDOMEN PELVIS W CONTRAST ? ?Result Date: 12/08/2021 ?CLINICAL DATA:  Epigastric pain, mid back pain. EXAM: CT ABDOMEN AND PELVIS WITH CONTRAST TECHNIQUE: Multidetector CT imaging of the abdomen and pelvis was performed using the standard protocol following bolus administration of intravenous contrast. RADIATION DOSE REDUCTION: This exam was performed according to the departmental dose-optimization program which includes automated exposure control, adjustment of the mA and/or kV according to patient size and/or use of iterative reconstruction technique. CONTRAST:  18m OMNIPAQUE IOHEXOL 300 MG/ML  SOLN COMPARISON:  12/05/2019 FINDINGS: Lower chest: Scarring in the lung bases.  No acute abnormality. Hepatobiliary: No focal hepatic abnormality. Gallbladder unremarkable. Pancreas: Extensive calcifications throughout the pancreas compatible with chronic pancreatitis, unchanged since prior study. Previously seen cystic area in the pancreatic tail has resolved. No evidence for acute pancreatitis. Small cystic area in the pancreatic head measures 11 mm. Spleen: No focal abnormality.  Normal size. Adrenals/Urinary Tract: Areas of cortical thinning/scarring bilaterally. No hydronephrosis or renal mass. Adrenal glands and urinary bladder unremarkable. Stomach/Bowel: Normal appendix. Stomach, large and small bowel grossly unremarkable. Vascular/Lymphatic: Heavily calcified aorta and iliac vessels. No evidence of aneurysm or adenopathy. Reproductive: No visible focal abnormality. Other: No free fluid or free air. Musculoskeletal: No acute bony abnormality. Advanced degenerative changes in the right hip. IMPRESSION: Changes of chronic calcific pancreatitis. No changes of acute pancreatitis. 11 mm cystic area within the pancreatic head. This is new since prior study. This could  reflect small pseudocyst although other cystic lesion cannot be completely excluded. This could be further evaluated with elective outpatient MRI if felt clinically indicated. Areas of scarring/cortical thinning in the kidneys bilaterally. Aortic atherosclerosis. Electronically Signed   By: KRolm BaptiseM.D.   On: 12/08/2021 20:44   ? ?PROCEDURES: ? ?Critical Care performed: No ? ?Procedures ? ? ?MEDICATIONS ORDERED IN ED: ?Medications  ?oxyCODONE-acetaminophen (PERCOCET/ROXICET) 5-325 MG per tablet 1 tablet (1 tablet Oral Given 12/08/21 1638)  ?azithromycin (ZITHROMAX) tablet 500 mg (has no administration in time range)  ?sodium chloride 0.9 % bolus 1,000 mL (0 mLs Intravenous Stopped 12/08/21 2154)  ?ondansetron ([facility] injection 4 mg (4 mg Intravenous Given 12/08/21 1948)  ?morphine (PF) 4 MG/ML injection 4 mg (4 mg Intravenous Given 12/08/21 1948)  ?iohexol (OMNIPAQUE) 300 MG/ML solution 100 mL (100 mLs Intravenous Contrast Given 12/08/21 2028)  ?oxyCODONE-acetaminophen (PERCOCET/ROXICET) 5-325 MG per tablet 1 tablet (1 tablet Oral Given 12/08/21 2154)  ? ? ? ?IMPRESSION / MDM / ASSESSMENT AND PLAN / ED COURSE  ?I reviewed the triage vital signs and the nursing notes. ?             ?               ? ?  Differential diagnosis includes, but is not limited to, bronchitis, pneumonia, viral illness, pancreatitis, cholecystitis, colitis  ? ? ?Patient's diagnosis is consistent with community-acquired pneumonia.patient presents the emergency department with epigastric and back pain as well as a cough and shortness of breath.  Patient does have chronic shortness of breath from what appears to be COPD as well as a history of chronic pancreatitis.  Given this patient had labs, imaging performed.  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  It is recommended that patient follow-up as an outpatient for further evaluation with MRI.  He may  follow-up with primary care for this.  No evidence of cholecystitis, colitis.  Patient did have some findings of bilateral hazy opacity that is concerning for pneumonia versus atelectasis.  With the reported shortness of breath,

## 2021-12-09 ENCOUNTER — Other Ambulatory Visit: Payer: Self-pay | Admitting: Internal Medicine

## 2021-12-09 ENCOUNTER — Telehealth: Payer: Self-pay

## 2021-12-09 DIAGNOSIS — H0520 Unspecified exophthalmos: Secondary | ICD-10-CM

## 2021-12-09 NOTE — Telephone Encounter (Signed)
Transition Care Management Unsuccessful Follow-up Telephone Call ? ?Date of discharge and from where:  12/08/2021 from [facility] ? ?Attempts:  1st Attempt ? ?Reason for unsuccessful TCM follow-up call:  Left voice message ? ? ? ?

## 2021-12-10 ENCOUNTER — Encounter: Payer: Self-pay | Admitting: Internal Medicine

## 2021-12-10 ENCOUNTER — Ambulatory Visit: Payer: Medicaid Other | Admitting: Internal Medicine

## 2021-12-10 VITALS — BP 136/82 | HR 104 | Temp 97.5°F | Wt 169.0 lb

## 2021-12-10 DIAGNOSIS — R1012 Left upper quadrant pain: Secondary | ICD-10-CM

## 2021-12-10 DIAGNOSIS — K8689 Other specified diseases of pancreas: Secondary | ICD-10-CM | POA: Diagnosis not present

## 2021-12-10 DIAGNOSIS — J984 Other disorders of lung: Secondary | ICD-10-CM | POA: Diagnosis not present

## 2021-12-10 DIAGNOSIS — E871 Hypo-osmolality and hyponatremia: Secondary | ICD-10-CM

## 2021-12-10 DIAGNOSIS — I700 Atherosclerosis of aorta: Secondary | ICD-10-CM | POA: Insufficient documentation

## 2021-12-10 DIAGNOSIS — J189 Pneumonia, unspecified organism: Secondary | ICD-10-CM | POA: Diagnosis not present

## 2021-12-10 MED ORDER — SERTRALINE HCL 100 MG PO TABS
100.0000 mg | ORAL_TABLET | Freq: Every day | ORAL | 0 refills | Status: DC
Start: 2021-12-10 — End: 2022-01-19

## 2021-12-10 MED ORDER — NAPROXEN 500 MG PO TABS: 500.0000 mg | ORAL_TABLET | Freq: Two times a day (BID) | ORAL | 1 refills | Status: DC | PRN

## 2021-12-10 MED ORDER — FLUTICASONE PROPIONATE 50 MCG/ACT NA SUSP: 2.0000 | Freq: Every day | NASAL | 6 refills | Status: DC

## 2021-12-10 NOTE — Telephone Encounter (Signed)
Transition Care Management Unsuccessful Follow-up Telephone Call ? ?Date of discharge and from where:  12/08/2021 from [facility] ? ?Attempts:  2nd Attempt ? ?Reason for unsuccessful TCM follow-up call:  Left voice message ? ? ? ?

## 2021-12-10 NOTE — Progress Notes (Signed)
? ?Subjective:  ? ? Patient ID: Noah Velasquez, male    DOB: 1966/05/17, 56 y.o.   MRN: [id number] ? ?HPI ? ?Patient presents to clinic today for ER follow-up.  He presented to the ER 4/24 with complaints of cough, shortness of breath and abdominal pain.  Labs showed a slightly elevated troponin which has been a chronic issue for him.  CBC was normal.  Metabolic panel showed a sodium of 129.  Chest x-ray showed: ? ?IMPRESSION: ?Evidence of underlying chronic lung disease. New hazy basilar opacities suspicious for superimposed atelectasis or pneumonia. ? ?CT abdomen pelvis showed: ? ?IMPRESSION: ?Changes of chronic calcific pancreatitis. No changes of acute pancreatitis. 11 mm cystic area within the pancreatic head. This is new since prior study. This could reflect small pseudocyst although other cystic lesion cannot be completely excluded. This could be further evaluated with elective outpatient MRI if felt clinically indicated. Areas of scarring/cortical thinning in the kidneys bilaterally. Aortic atherosclerosis. ? ?He was treated with IV fluids, pain medication and Azithromycin.  He was discharged and advised to follow-up with his PCP.  Since discharge, he reports his cough and shortness of breath has improved.  He did quit smoking 3 weeks ago.  He missed his appointment with [facility] pulmonology and has not rescheduled this yet.  He reports he intermittently has left upper quadrant abdominal pain which he describes as pinching. ? ?Review of Systems ? ?   ?Past Medical History:  ?Diagnosis Date  ? Chronic hip pain   ? Chronic pancreatitis ([place])   ? CKD (chronic kidney disease) stage 3, GFR 30-59 ml/min (HCC)   ? GERD (gastroesophageal reflux disease)   ? Hyperlipidemia   ? Hypertension   ? Osteoarthritis   ? Pancreatitis   ? Pancreatitis   ? PTSD (post-traumatic stress disorder)   ? Viral infection of left eye   ? ? ?Current Outpatient Medications  ?Medication Sig Dispense Refill  ? albuterol (VENTOLIN HFA) 108 (90  Base) MCG/ACT inhaler Inhale 1-2 puffs into the lungs every 6 (six) hours as needed for wheezing or shortness of breath. 18 g 0  ? amLODipine (NORVASC) 10 MG tablet Take 1 tablet (10 mg total) by mouth daily. 90 tablet 1  ? azithromycin (ZITHROMAX Z-PAK) 250 MG tablet Take 2 tablets (500 mg) on  Day 1,  followed by 1 tablet (250 mg) once daily on Days 2 through 5. 6 each 0  ? Blood Pressure KIT Please allow patient to chose from machines that are covered under insurance 1 kit 0  ? buPROPion (WELLBUTRIN XL) 150 MG 24 hr tablet Take 1 tablet (150 mg total) by mouth daily. 90 tablet 1  ? famotidine (PEPCID) 20 MG tablet Take 1 tablet (20 mg total) by mouth 2 (two) times daily. 180 tablet 1  ? gabapentin (NEURONTIN) 100 MG capsule Take 1 capsule (100 mg total) by mouth at bedtime as needed (sleep). (Patient not taking: Reported on 09/23/2021) 90 capsule 1  ? losartan (COZAAR) 100 MG tablet Take 1 tablet (100 mg total) by mouth daily. 90 tablet 1  ? naproxen (NAPROSYN) 500 MG tablet TAKE 1 TABLET (500 MG TOTAL) BY MOUTH 2 (TWO) TIMES DAILY AS NEEDED FOR MILD PAIN OR MODERATE PAIN. 30 tablet 1  ? nicotine (NICODERM CQ - DOSED IN MG/24 HOURS) 14 mg/24hr patch Place 1 patch (14 mg total) onto the skin daily. 14 patch 0  ? Olopatadine HCl 0.2 % SOLN APPLY 1 DROP TO EYE ONCE DAILY 2.5 mL 0  ?  omeprazole (PRILOSEC) 20 MG capsule TAKE 1 CAPSULE BY MOUTH ONCE DAILY 90 capsule 1  ? Pancrelipase, Lip-Prot-Amyl, (ZENPEP) 15000-47000 units CPEP TAKE 1 CAPSULE BY MOUTH 3 (THREE) TIMES DAILY WITH MEALS. 270 capsule 1  ? sertraline (ZOLOFT) 100 MG tablet Take 1 tablet (100 mg total) by mouth daily. Please schedule a physical before anymore refills. 30 tablet 0  ? traZODone (DESYREL) 150 MG tablet Take 1 tablet (150 mg total) by mouth at bedtime as needed for sleep. 30 tablet 5  ? ?No current facility-administered medications for this visit.  ? ? ?Allergies  ?Allergen Reactions  ? Hctz [Hydrochlorothiazide] Swelling  ? ? ?Family History   ?Problem Relation Age of Onset  ? Diabetes Mother   ? Sleep apnea Mother   ? Emphysema Sister   ? CAD Neg Hx   ? Mental illness Neg Hx   ? ? ?Social History  ? ?Socioeconomic History  ? Marital status: Legally Separated  ?  Spouse name: Not on file  ? Number of children: 2  ? Years of education: Not on file  ? Highest education level: High school graduate  ?Occupational History  ? Occupation: on disability  ?Tobacco Use  ? Smoking status: Every Day  ?  Packs/day: 0.25  ?  Years: 15.00  ?  Pack years: 3.75  ?  Types: Cigarettes  ? Smokeless tobacco: Never  ?Vaping Use  ? Vaping Use: Never used  ?Substance and Sexual Activity  ? Alcohol use: Yes  ?  Alcohol/week: 7.0 standard drinks  ?  Types: 7 Cans of beer per week  ?  Comment: weekly  ? Drug use: Yes  ?  Types: Marijuana  ? Sexual activity: Yes  ?  Birth control/protection: Condom  ?Other Topics Concern  ? Not on file  ?Social History Narrative  ? Not on file  ? ?Social Determinants of Health  ? ?Financial Resource Strain: Not on file  ?Food Insecurity: Not on file  ?Transportation Needs: Not on file  ?Physical Activity: Not on file  ?Stress: Not on file  ?Social Connections: Not on file  ?Intimate Partner Violence: Not on file  ? ? ? ?Constitutional: Denies fever, malaise, fatigue, headache or abrupt weight changes.  ?HEENT: Denies eye pain, eye redness, ear pain, ringing in the ears, wax buildup, runny nose, nasal congestion, bloody nose, or sore throat. ?Respiratory: Patient reports intermittent shortness of breath.  Denies difficulty breathing, cough or sputum production.   ?Cardiovascular: Denies chest pain, chest tightness, palpitations or swelling in the hands or feet.  ?Gastrointestinal: Patient reports LUQ abdominal pain.  Denies bloating, constipation, diarrhea or blood in the stool.  ?Skin: Denies redness, rashes, lesions or ulcercations.  ?Neurological: Denies dizziness, difficulty with memory, difficulty with speech or problems with balance and  coordination.  ? ? ?No other specific complaints in a complete review of systems (except as listed in HPI above). ? ?Objective:  ? Physical Exam ? ?BP 136/82 (BP Location: Left Arm, Patient Position: Sitting, Cuff Size: Large)   Pulse (!) 104   Temp (!) 97.5 ?F (36.4 ?C) (Temporal)   Wt 169 lb (76.7 kg)   SpO2 96%   BMI 26.47 kg/m?  ? ?Wt Readings from Last 3 Encounters:  ?12/08/21 178 lb (80.7 kg)  ?09/23/21 180 lb (81.6 kg)  ?06/11/21 162 lb 3.2 oz (73.6 kg)  ? ? ?General: Appears his stated age, overweight, chronically ill-appearing, in NAD. ?Skin: Warm, dry and intact.  ?Cardiovascular: Normal rate and rhythm. S1,S2 noted.  No murmur, rubs or gallops noted.  ?Pulmonary/Chest: Normal effort and positive vesicular breath sounds. No respiratory distress. No wheezes, rales or ronchi noted.  ?Abdomen: Soft and mildly tender in the LUQ.Marland Kitchen Normal bowel sounds. No distention or masses noted. Liver, spleen and kidneys non palpable. ?Musculoskeletal: In a power wheelchair. ?Neurological: Alert and oriented.  ? ?BMET ?   ?Component Value Date/Time  ? NA 129 (L) 12/08/2021 1945  ? K 3.5 12/08/2021 1945  ? CL 96 (L) 12/08/2021 1945  ? CO2 23 12/08/2021 1945  ? GLUCOSE 119 (H) 12/08/2021 1945  ? BUN 12 12/08/2021 1945  ? CREATININE 0.94 12/08/2021 1945  ? CREATININE 1.26 06/06/2020 1141  ? CALCIUM 9.1 12/08/2021 1945  ? GFRNONAA >60 12/08/2021 1945  ? GFRNONAA 64 06/06/2020 1141  ? GFRAA 74 06/06/2020 1141  ? ? ?Lipid Panel  ?   ?Component Value Date/Time  ? CHOL 150 06/06/2020 1141  ? TRIG 59 06/06/2020 1141  ? HDL 48 06/06/2020 1141  ? CHOLHDL 3.1 06/06/2020 1141  ? VLDL 18 06/12/2018 1015  ? Eunice 88 06/06/2020 1141  ? ? ?CBC ?   ?Component Value Date/Time  ? WBC 8.8 12/08/2021 1945  ? RBC 4.97 12/08/2021 1945  ? HGB 15.2 12/08/2021 1945  ? HCT 45.8 12/08/2021 1945  ? PLT 317 12/08/2021 1945  ? MCV 92.2 12/08/2021 1945  ? MCH 30.6 12/08/2021 1945  ? MCHC 33.2 12/08/2021 1945  ? RDW 13.2 12/08/2021 1945  ? LYMPHSABS  2.4 12/08/2021 1945  ? MONOABS 0.5 12/08/2021 1945  ? EOSABS 0.0 12/08/2021 1945  ? BASOSABS 0.1 12/08/2021 1945  ? ? ?Hgb A1C ?Lab Results  ?Component Value Date  ? HGBA1C 5.2 01/10/2020  ? ? ? ? ? ? ?   ?Assess

## 2021-12-10 NOTE — Patient Instructions (Signed)
Community-Acquired Pneumonia, Adult ?Pneumonia is an infection of the lungs. It causes irritation and swelling in the airways of the lungs. Mucus and fluid may also build up inside the airways. This may cause coughing and trouble breathing. ?One type of pneumonia can happen while you are in a hospital. A different type can happen when you are not in a hospital (community-acquired pneumonia). ?What are the causes? ? ?This condition is caused by germs (viruses, bacteria, or fungi). Some types of germs can spread from person to person. Pneumonia is not thought to spread from person to person. ?What increases the risk? ?You are more likely to develop this condition if: ?You have a long-term (chronic) disease, such as: ?Disease of the lungs. This may be chronic obstructive pulmonary disease (COPD) or asthma. ?Heart failure. ?Cystic fibrosis. ?Diabetes. ?Kidney disease. ?Sickle cell disease. ?HIV. ?You have other health problems, such as: ?Your body's defense system (immune system) is weak. ?A condition that may cause you to breathe in fluids from your mouth and nose. ?You had your spleen taken out. ?You do not take good care of your teeth and mouth (poor dental hygiene). ?You use or have used tobacco products. ?You travel where the germs that cause this illness are common. ?You are near certain animals or the places they live. ?You are older than 57 years of age. ?What are the signs or symptoms? ?Symptoms of this condition include: ?A cough. ?A fever. ?Sweating or chills. ?Chest pain, often when you breathe deeply or cough. ?Breathing problems, such as: ?Fast breathing. ?Trouble breathing. ?Shortness of breath. ?Feeling tired (fatigued). ?Muscle aches. ?How is this treated? ?Treatment for this condition depends on many things, such as: ?The cause of your illness. ?Your medicines. ?Your other health problems. ?Most adults can be treated at home. Sometimes, treatment must happen in a hospital. ?Treatment may include  medicines to kill germs. ?Medicines may depend on which germ caused your illness. ?Very bad pneumonia is rare. If you get it, you may: ?Have a machine to help you breathe. ?Have fluid taken away from around your lungs. ?Follow these instructions at home: ?Medicines ?Take over-the-counter and prescription medicines only as told by your doctor. ?Take cough medicine only if you are losing sleep. Cough medicine can keep your body from taking mucus away from your lungs. ?If you were prescribed an antibiotic medicine, take it as told by your doctor. Do not stop taking the antibiotic even if you start to feel better. ?Lifestyle ? ?  ? ?Do not drink alcohol. ?Do not use any products that contain nicotine or tobacco, such as cigarettes, e-cigarettes, and chewing tobacco. If you need help quitting, ask your doctor. ?Eat a healthy diet. This includes a lot of vegetables, fruits, whole grains, low-fat dairy products, and low-fat (lean) protein. ?General instructions ? ?Rest a lot. Sleep for at least 8 hours each night. ?Sleep with your head and neck raised. Put a few pillows under your head or sleep in a reclining chair. ?Return to your normal activities as told by your doctor. Ask your doctor what activities are safe for you. ?Drink enough fluid to keep your pee (urine) pale yellow. ?If your throat is sore, rinse your mouth often with salt water. To make salt water, dissolve ?-1 tsp (3-6 g) of salt in 1 cup (237 mL) of warm water. ?Keep all follow-up visits as told by your doctor. This is important. ?How is this prevented? ?You can lower your risk of pneumonia by: ?Getting the pneumonia shot (vaccine). These  shots have different types and schedules. Ask your doctor what works best for you. Think about getting this shot if: ?You are older than 57 years of age. ?You are 60-70 years of age and: ?You are being treated for cancer. ?You have long-term lung disease. ?You have other problems that affect your body's defense system. Ask  your doctor if you have one of these. ?Getting your flu shot every year. Ask your doctor which type of shot is best for you. ?Going to the dentist as often as told. ?Washing your hands often with soap and water for at least 20 seconds. If you cannot use soap and water, use hand sanitizer. ?Contact a doctor if: ?You have a fever. ?You lose sleep because your cough medicine does not help. ?Get help right away if: ?You are short of breath and this gets worse. ?You have more chest pain. ?Your sickness gets worse. This is very serious if: ?You are an older adult. ?Your body's defense system is weak. ?You cough up blood. ?These symptoms may be an emergency. Do not wait to see if the symptoms will go away. Get medical help right away. Call your local emergency services (911 in the [place] Do not drive yourself to the hospital. ?Summary ?Pneumonia is an infection of the lungs. ?Community-acquired pneumonia affects people who have not been in the hospital. Certain germs can cause this infection. ?This condition may be treated with medicines that kill germs. ?For very bad pneumonia, you may need a hospital stay and treatment to help with breathing. ?This information is not intended to replace advice given to you by your health care provider. Make sure you discuss any questions you have with your health care provider. ?Document Revised: 05/16/2019 Document Reviewed: 05/16/2019 ?Elsevier Patient Education ? [place]. ? ?

## 2021-12-11 DIAGNOSIS — F3110 Bipolar disorder, current episode manic without psychotic features, unspecified: Secondary | ICD-10-CM | POA: Diagnosis not present

## 2021-12-11 LAB — BASIC METABOLIC PANEL
BUN: 15 mg/dL (ref 7–25)
CO2: 22 mmol/L (ref 20–32)
Calcium: 9.3 mg/dL (ref 8.6–10.3)
Chloride: 99 mmol/L (ref 98–110)
Creat: 1.2 mg/dL (ref 0.70–1.30)
Glucose, Bld: 113 mg/dL — ABNORMAL HIGH (ref 65–99)
Potassium: 4 mmol/L (ref 3.5–5.3)
Sodium: 131 mmol/L — ABNORMAL LOW (ref 135–146)

## 2021-12-11 NOTE — Telephone Encounter (Signed)
Naproxen refilled 12/10/2021 #30 1 refill. ?Requested Prescriptions  ?Pending Prescriptions Disp Refills  ?? famotidine (PEPCID) 20 MG tablet [Pharmacy Med Name: FAMOTIDINE 20 MG TAB] 180 tablet 0  ?  Sig: TAKE 1 TABLET BY MOUTH TWICE DAILY  ?  ? Gastroenterology:  H2 Antagonists Passed - 12/09/2021  5:45 PM  ?  ?  Passed - Valid encounter within last 12 months  ?  Recent Outpatient Visits   ?      ? Yesterday LUQ pain  ? [facility] [place], [place] W, NP  ? 2 months ago Shortness of breath  ? [facility] [place], Coralie Keens, NP  ? 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 [facility] [place], [place] W, NP  ? 10 months ago Posttraumatic stress disorder  ? [facility] [place], Coralie Keens, NP  ? 1 year ago Essential hypertension  ? [facility] [place], Lupita Raider, FNP  ?  ?  ? ?  ?  ?  ?? naproxen (NAPROSYN) 500 MG tablet [Pharmacy Med Name: NAPROXEN 500 MG TAB] 30 tablet 1  ?  Sig: TAKE 1 TABLET BY MOUTH TWICE DAILY AS NEEDED FOR MILD PAIN  ?  ? Analgesics:  NSAIDS Failed - 12/09/2021  5:45 PM  ?  ?  Failed - Manual Review: Labs are only required if the patient has taken medication for more than 8 weeks.  ?  ?  Passed - Cr in normal range and within 360 days  ?  Creat  ?Date Value Ref Range Status  ?12/10/2021 1.20 0.70 - 1.30 mg/dL Final  ?   ?  ?  Passed - HGB in normal range and within 360 days  ?  Hemoglobin  ?Date Value Ref Range Status  ?12/08/2021 15.2 13.0 - 17.0 g/dL Final  ?   ?  ?  Passed - PLT in normal range and within 360 days  ?  Platelets  ?Date Value Ref Range Status  ?12/08/2021 317 150 - 400 K/uL Final  ?   ?  ?  Passed - HCT in normal range and within 360 days  ?  HCT  ?Date Value Ref Range Status  ?12/08/2021 45.8 39.0 - 52.0 % Final  ?   ?  ?  Passed - eGFR is 30 or above and within 360 days  ?  GFR, Est African American  ?Date Value Ref Range Status  ?06/06/2020 74 > OR = 60 mL/min/1.62m Final  ? ?GFR, Est Non African American  ?Date Value Ref  Range Status  ?06/06/2020 64 > OR = 60 mL/min/1.767mFinal  ? ?GFR, Estimated  ?Date Value Ref Range Status  ?12/08/2021 >60 >60 mL/min Final  ?  Comment:  ?  (NOTE) ?Calculated using the CKD-EPI Creatinine Equation (2021) ?  ?   ?  ?  Passed - Patient is not pregnant  ?  ?  Passed - Valid encounter within last 12 months  ?  Recent Outpatient Visits   ?      ? Yes[place]  [place] [facility] [place]  ? 2 months ago [facility]  [facility]  ? 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  ? 10 months ago [facility] stress disorder  [facility]  ? 1 year ago Essential hypertension  ? [place]  [facility] [place], Lupita Raider, FNP  ?  ?  ? ?  ?  ?  ? ?

## 2021-12-17 DIAGNOSIS — F3110 Bipolar disorder, current episode manic without psychotic features, unspecified: Secondary | ICD-10-CM | POA: Diagnosis not present

## 2021-12-21 ENCOUNTER — Other Ambulatory Visit: Payer: Self-pay

## 2021-12-21 ENCOUNTER — Inpatient Hospital Stay
Admission: EM | Admit: 2021-12-21 | Discharge: 2021-12-24 | DRG: 439 | Disposition: A | Discharge status: Home / Self Care | Payer: Medicaid Other | Attending: Internal Medicine | Admitting: Internal Medicine

## 2021-12-21 DIAGNOSIS — Z79899 Other long term (current) drug therapy: Secondary | ICD-10-CM

## 2021-12-21 DIAGNOSIS — F17210 Nicotine dependence, cigarettes, uncomplicated: Secondary | ICD-10-CM | POA: Diagnosis present

## 2021-12-21 DIAGNOSIS — G629 Polyneuropathy, unspecified: Secondary | ICD-10-CM

## 2021-12-21 DIAGNOSIS — I160 Hypertensive urgency: Secondary | ICD-10-CM

## 2021-12-21 DIAGNOSIS — K852 Alcohol induced acute pancreatitis without necrosis or infection: Secondary | ICD-10-CM | POA: Diagnosis not present

## 2021-12-21 DIAGNOSIS — K219 Gastro-esophageal reflux disease without esophagitis: Secondary | ICD-10-CM | POA: Diagnosis present

## 2021-12-21 DIAGNOSIS — F10929 Alcohol use, unspecified with intoxication, unspecified: Secondary | ICD-10-CM

## 2021-12-21 DIAGNOSIS — Y906 Blood alcohol level of 120-199 mg/100 ml: Secondary | ICD-10-CM | POA: Diagnosis present

## 2021-12-21 DIAGNOSIS — K860 Alcohol-induced chronic pancreatitis: Secondary | ICD-10-CM | POA: Diagnosis present

## 2021-12-21 DIAGNOSIS — I129 Hypertensive chronic kidney disease with stage 1 through stage 4 chronic kidney disease, or unspecified chronic kidney disease: Secondary | ICD-10-CM | POA: Diagnosis present

## 2021-12-21 DIAGNOSIS — R001 Bradycardia, unspecified: Secondary | ICD-10-CM | POA: Diagnosis not present

## 2021-12-21 DIAGNOSIS — E785 Hyperlipidemia, unspecified: Secondary | ICD-10-CM | POA: Diagnosis present

## 2021-12-21 DIAGNOSIS — Z888 Allergy status to other drugs, medicaments and biological substances status: Secondary | ICD-10-CM

## 2021-12-21 DIAGNOSIS — F32A Depression, unspecified: Secondary | ICD-10-CM | POA: Diagnosis present

## 2021-12-21 DIAGNOSIS — F419 Anxiety disorder, unspecified: Secondary | ICD-10-CM | POA: Diagnosis present

## 2021-12-21 DIAGNOSIS — F3342 Major depressive disorder, recurrent, in full remission: Secondary | ICD-10-CM | POA: Diagnosis present

## 2021-12-21 DIAGNOSIS — K859 Acute pancreatitis without necrosis or infection, unspecified: Secondary | ICD-10-CM | POA: Diagnosis present

## 2021-12-21 DIAGNOSIS — G8929 Other chronic pain: Secondary | ICD-10-CM | POA: Diagnosis present

## 2021-12-21 DIAGNOSIS — R1084 Generalized abdominal pain: Secondary | ICD-10-CM | POA: Diagnosis not present

## 2021-12-21 DIAGNOSIS — F4310 Post-traumatic stress disorder, unspecified: Secondary | ICD-10-CM | POA: Diagnosis present

## 2021-12-21 DIAGNOSIS — F1010 Alcohol abuse, uncomplicated: Secondary | ICD-10-CM

## 2021-12-21 DIAGNOSIS — F10129 Alcohol abuse with intoxication, unspecified: Secondary | ICD-10-CM | POA: Diagnosis present

## 2021-12-21 DIAGNOSIS — N183 Chronic kidney disease, stage 3 unspecified: Secondary | ICD-10-CM | POA: Diagnosis present

## 2021-12-21 DIAGNOSIS — R52 Pain, unspecified: Secondary | ICD-10-CM | POA: Diagnosis not present

## 2021-12-21 DIAGNOSIS — F10920 Alcohol use, unspecified with intoxication, uncomplicated: Secondary | ICD-10-CM

## 2021-12-21 DIAGNOSIS — I10 Essential (primary) hypertension: Secondary | ICD-10-CM | POA: Diagnosis not present

## 2021-12-21 DIAGNOSIS — E876 Hypokalemia: Secondary | ICD-10-CM | POA: Diagnosis present

## 2021-12-21 DIAGNOSIS — K863 Pseudocyst of pancreas: Secondary | ICD-10-CM | POA: Diagnosis present

## 2021-12-21 LAB — COMPREHENSIVE METABOLIC PANEL
ALT: 14 U/L (ref 0–44)
AST: 23 U/L (ref 15–41)
Albumin: 3.7 g/dL (ref 3.5–5.0)
Alkaline Phosphatase: 74 U/L (ref 38–126)
Anion gap: 11 (ref 5–15)
BUN: 12 mg/dL (ref 6–20)
CO2: 22 mmol/L (ref 22–32)
Calcium: 8.9 mg/dL (ref 8.9–10.3)
Chloride: 99 mmol/L (ref 98–111)
Creatinine, Ser: 0.93 mg/dL (ref 0.61–1.24)
GFR, Estimated: >60 mL/min (ref >60)
Glucose, Bld: 123 mg/dL — ABNORMAL HIGH (ref 70–99)
Potassium: 3.3 mmol/L — ABNORMAL LOW (ref 3.5–5.1)
Sodium: 132 mmol/L — ABNORMAL LOW (ref 135–145)
Total Bilirubin: 0.5 mg/dL (ref 0.3–1.2)
Total Protein: 10.3 g/dL — ABNORMAL HIGH (ref 6.5–8.1)

## 2021-12-21 LAB — CBC WITH DIFFERENTIAL/PLATELET
Abs Immature Granulocytes: 0.09 10*3/uL — ABNORMAL HIGH (ref 0.00–0.07)
Basophils Absolute: 0.1 10*3/uL (ref 0.0–0.1)
Basophils Relative: 1 %
Eosinophils Absolute: 0.1 10*3/uL (ref 0.0–0.5)
Eosinophils Relative: 0 %
HCT: 45.9 % (ref 39.0–52.0)
Hemoglobin: 15.1 g/dL (ref 13.0–17.0)
Immature Granulocytes: 1 %
Lymphocytes Relative: 18 %
Lymphs Abs: 2.8 10*3/uL (ref 0.7–4.0)
MCH: 30.3 pg (ref 26.0–34.0)
MCHC: 32.9 g/dL (ref 30.0–36.0)
MCV: 92.2 fL (ref 80.0–100.0)
Monocytes Absolute: 0.6 10*3/uL (ref 0.1–1.0)
Monocytes Relative: 4 %
Neutro Abs: 12.1 10*3/uL — ABNORMAL HIGH (ref 1.7–7.7)
Neutrophils Relative %: 76 %
Platelets: 389 10*3/uL (ref 150–400)
RBC: 4.98 MIL/uL (ref 4.22–5.81)
RDW: 13 % (ref 11.5–15.5)
WBC: 15.8 10*3/uL — ABNORMAL HIGH (ref 4.0–10.5)
nRBC: 0 % (ref 0.0–0.2)

## 2021-12-21 LAB — SALICYLATE LEVEL: Salicylate Lvl: <7 mg/dL — ABNORMAL LOW (ref 7.0–30.0)

## 2021-12-21 LAB — ACETAMINOPHEN LEVEL: Acetaminophen (Tylenol), Serum: 10 ug/mL (ref 10–30)

## 2021-12-21 LAB — LIPASE, BLOOD: Lipase: 134 U/L — ABNORMAL HIGH (ref 11–51)

## 2021-12-21 LAB — ETHANOL: Alcohol, Ethyl (B): 133 mg/dL — ABNORMAL HIGH (ref <10)

## 2021-12-21 MED ORDER — ENOXAPARIN SODIUM 40 MG/0.4ML IJ SOSY
40.0000 mg | PREFILLED_SYRINGE | INTRAMUSCULAR | Status: DC
  Administered 2021-12-22 – 2021-12-24 (×3): 40 mg via SUBCUTANEOUS
  Filled 2021-12-21 (×3): qty 0.4

## 2021-12-21 MED ORDER — GABAPENTIN 100 MG PO CAPS
100.0000 mg | ORAL_CAPSULE | Freq: Every evening | ORAL | Status: DC | PRN
  Administered 2021-12-22: 100 mg via ORAL
  Filled 2021-12-21: qty 1

## 2021-12-21 MED ORDER — PANCRELIPASE (LIP-PROT-AMYL) 12000-38000 UNITS PO CPEP
12000.0000 [IU] | ORAL_CAPSULE | Freq: Three times a day (TID) | ORAL | Status: DC
  Administered 2021-12-22 – 2021-12-24 (×7): 12000 [IU] via ORAL
  Filled 2021-12-21 (×8): qty 1

## 2021-12-21 MED ORDER — MORPHINE SULFATE (PF) 4 MG/ML IV SOLN
4.0000 mg | Freq: Once | INTRAVENOUS | Status: AC
Start: 2021-12-21 — End: 2021-12-21
  Administered 2021-12-21: 4 mg via INTRAVENOUS
  Filled 2021-12-21: qty 1

## 2021-12-21 MED ORDER — BUPROPION HCL ER (XL) 150 MG PO TB24
150.0000 mg | ORAL_TABLET | Freq: Every day | ORAL | Status: DC
  Administered 2021-12-22 – 2021-12-24 (×3): 150 mg via ORAL
  Filled 2021-12-21 (×3): qty 1

## 2021-12-21 MED ORDER — ACETAMINOPHEN 650 MG RE SUPP: 650.0000 mg | Freq: Four times a day (QID) | RECTAL | Status: DC | PRN

## 2021-12-21 MED ORDER — POTASSIUM CHLORIDE IN NACL 20-0.9 MEQ/L-% IV SOLN
INTRAVENOUS | Status: DC
  Filled 2021-12-21 (×9): qty 1000

## 2021-12-21 MED ORDER — PROCHLORPERAZINE EDISYLATE 10 MG/2ML IJ SOLN
10.0000 mg | Freq: Once | INTRAMUSCULAR | Status: AC
  Administered 2021-12-21: 10 mg via INTRAVENOUS
  Filled 2021-12-21: qty 2

## 2021-12-21 MED ORDER — OLOPATADINE HCL 0.1 % OP SOLN
1.0000 [drp] | Freq: Two times a day (BID) | OPHTHALMIC | Status: DC
  Administered 2021-12-23 – 2021-12-24 (×3): 1 [drp] via OPHTHALMIC
  Filled 2021-12-21: qty 5

## 2021-12-21 MED ORDER — FAMOTIDINE 20 MG PO TABS
20.0000 mg | ORAL_TABLET | Freq: Two times a day (BID) | ORAL | Status: DC
  Administered 2021-12-22 – 2021-12-24 (×5): 20 mg via ORAL
  Filled 2021-12-21 (×5): qty 1

## 2021-12-21 MED ORDER — TRAZODONE HCL 50 MG PO TABS
150.0000 mg | ORAL_TABLET | Freq: Every evening | ORAL | Status: DC | PRN
  Administered 2021-12-23: 150 mg via ORAL
  Filled 2021-12-21: qty 3

## 2021-12-21 MED ORDER — ACETAMINOPHEN 325 MG PO TABS: 650.0000 mg | ORAL_TABLET | Freq: Once | ORAL | Status: DC

## 2021-12-21 MED ORDER — FLUTICASONE PROPIONATE 50 MCG/ACT NA SUSP
2.0000 | Freq: Every day | NASAL | Status: DC | PRN
  Administered 2021-12-23 – 2021-12-24 (×2): 2 via NASAL
  Filled 2021-12-21: qty 16

## 2021-12-21 MED ORDER — AMLODIPINE BESYLATE 10 MG PO TABS
10.0000 mg | ORAL_TABLET | Freq: Every day | ORAL | Status: DC
  Administered 2021-12-22 – 2021-12-24 (×3): 10 mg via ORAL
  Filled 2021-12-21: qty 2
  Filled 2021-12-21 (×2): qty 1

## 2021-12-21 MED ORDER — ONDANSETRON HCL 4 MG/2ML IJ SOLN
4.0000 mg | Freq: Once | INTRAMUSCULAR | Status: AC
  Administered 2021-12-21: 4 mg via INTRAVENOUS
  Filled 2021-12-21: qty 2

## 2021-12-21 MED ORDER — ONDANSETRON HCL 4 MG/2ML IJ SOLN: 4.0000 mg | Freq: Four times a day (QID) | INTRAMUSCULAR | Status: DC | PRN

## 2021-12-21 MED ORDER — LACTATED RINGERS IV BOLUS
1000.0000 mL | Freq: Once | INTRAVENOUS | Status: AC
  Administered 2021-12-21: 1000 mL via INTRAVENOUS

## 2021-12-21 MED ORDER — SERTRALINE HCL 50 MG PO TABS
100.0000 mg | ORAL_TABLET | Freq: Every day | ORAL | Status: DC
  Administered 2021-12-22 – 2021-12-24 (×3): 100 mg via ORAL
  Filled 2021-12-21 (×3): qty 2

## 2021-12-21 MED ORDER — ACETAMINOPHEN 325 MG PO TABS
650.0000 mg | ORAL_TABLET | Freq: Four times a day (QID) | ORAL | Status: DC | PRN
  Administered 2021-12-22: 650 mg via ORAL
  Filled 2021-12-21: qty 2

## 2021-12-21 MED ORDER — MAGNESIUM HYDROXIDE 400 MG/5ML PO SUSP: 30.0000 mL | Freq: Every day | ORAL | Status: DC | PRN

## 2021-12-21 MED ORDER — ALBUTEROL SULFATE HFA 108 (90 BASE) MCG/ACT IN AERS
1.0000 | INHALATION_SPRAY | Freq: Four times a day (QID) | RESPIRATORY_TRACT | Status: DC | PRN
Start: 2021-12-21 — End: 2021-12-22

## 2021-12-21 MED ORDER — ONDANSETRON HCL 4 MG PO TABS: 4.0000 mg | ORAL_TABLET | Freq: Four times a day (QID) | ORAL | Status: DC | PRN

## 2021-12-21 NOTE — Assessment & Plan Note (Signed)
-   The patient will admitted to a medical telemetry bed. ?- He will be kept npo. ?- We will follow serial lipase levels. ?- Pain management will be provided. ?- He was counseled for alcohol cessation. ?

## 2021-12-21 NOTE — ED Provider Notes (Signed)
? ?[facility] ?Provider Note ? ? ? Event Date/Time  ? First MD Initiated Contact with Patient 12/21/21 2152   ?  (approximate) ? ? ?History  ? ?Chief Complaint ?Pancreatitis ? ? ?HPI ? ?Noah Velasquez is a 56 y.o. male with past medical history of hypertension, hyperlipidemia, chronic pancreatitis, GERD, PTSD, and CKD who presents to the ED complaining of abdominal pain.  Patient reports that he had acute onset of pain in his epigastric area around 1 PM.  He describes the pain as sharp and constant, not exacerbated or alleviated by anything in particular.  He has been feeling nauseous with this and has vomited multiple times, denies any diarrhea.  He describes symptoms as similar to when he has been diagnosed with pancreatitis in the past.  He admits to consuming 2 beers yesterday and then had 2 shots of liquor today after the onset of symptoms because he "wanted to see if it would help." ?  ? ? ?Physical Exam  ? ?Triage Vital Signs: ?ED Triage Vitals [12/21/21 2108]  ?Enc Vitals Group  ?   BP (!) 169/95  ?   Pulse Rate 80  ?   Resp 18  ?   Temp 98 ?F (36.7 ?C)  ?   Temp Source Oral  ?   SpO2 93 %  ?   Weight   ?   Height   ?   Head Circumference   ?   Peak Flow   ?   Pain Score 10  ?   Pain Loc   ?   Pain Edu?   ?   Excl. in [place]?   ? ? ?Most recent vital signs: ?Vitals:  ? 12/21/21 2215 12/21/21 2300  ?BP: (!) 165/90 (!) 172/96  ?Pulse: 83 88  ?Resp: 17 15  ?Temp:    ?SpO2: 98% 94%  ? ? ?Constitutional: Alert and oriented. ?Eyes: Conjunctivae are normal. ?Head: Atraumatic. ?Nose: No congestion/rhinnorhea. ?Mouth/Throat: Mucous membranes are moist.  ?Cardiovascular: Normal rate, regular rhythm. Grossly normal heart sounds.  2+ radial pulses bilaterally. ?Respiratory: Normal respiratory effort.  No retractions. Lungs CTAB. ?Gastrointestinal: Soft and diffusely tender to palpation, greatest in the epigastrium with voluntary guarding. No distention. ?Musculoskeletal: No lower extremity tenderness  nor edema.  ?Neurologic:  Normal speech and language. No gross focal neurologic deficits are appreciated. ? ? ? ?ED Results / Procedures / Treatments  ? ?Labs ?(all labs ordered are listed, but only abnormal results are displayed) ?Labs Reviewed  ?CBC WITH DIFFERENTIAL/PLATELET - Abnormal; Notable for the following components:  ?    Result Value  ? WBC 15.8 (*)   ? Neutro Abs 12.1 (*)   ? Abs Immature Granulocytes 0.09 (*)   ? All other components within normal limits  ?COMPREHENSIVE METABOLIC PANEL - Abnormal; Notable for the following components:  ? Sodium 132 (*)   ? Potassium 3.3 (*)   ? Glucose, Bld 123 (*)   ? Total Protein 10.3 (*)   ? All other components within normal limits  ?ETHANOL - Abnormal; Notable for the following components:  ? Alcohol, Ethyl (B) 133 (*)   ? All other components within normal limits  ?SALICYLATE LEVEL - Abnormal; Notable for the following components:  ? Salicylate Lvl <4.4 (*)   ? All other components within normal limits  ?LIPASE, BLOOD - Abnormal; Notable for the following components:  ? Lipase 134 (*)   ? All other components within normal limits  ?ACETAMINOPHEN LEVEL  ? ? ? ?  EKG ? ?ED ECG REPORT ?Tempie Hoist, the attending physician, personally viewed and interpreted this ECG. ? ? Date: 12/21/2021 ? EKG Time: 21:14 ? Rate: 83 ? Rhythm: normal sinus rhythm ? Axis: LAD ? Intervals:left anterior fascicular block ? ST&T Change: None ? ?PROCEDURES: ? ?Critical Care performed: No ? ?Procedures ? ? ?MEDICATIONS ORDERED IN ED: ?Medications  ?prochlorperazine (COMPAZINE) injection 10 mg (has no administration in time range)  ?morphine (PF) 4 MG/ML injection 4 mg (4 mg Intravenous Given 12/21/21 2240)  ?ondansetron [place]) injection 4 mg (4 mg Intravenous Given 12/21/21 2236)  ?lactated ringers bolus 1,000 mL (1,000 mLs Intravenous New Bag/Given 12/21/21 2239)  ? ? ? ?IMPRESSION / MDM / ASSESSMENT AND PLAN / ED COURSE  ?I reviewed the triage vital signs and the nursing notes. ?              ?               ? ?56 y.o. male with past medical history of hypertension, hyperlipidemia, chronic pancreatitis, GERD, CKD, and PTSD who presents to the ED complaining of severe pain in his epigastrium associated with nausea and vomiting since 1 PM this afternoon after recently consuming alcohol. ? ?Differential diagnosis includes, but is not limited to, acute pancreatitis, chronic pancreatitis, infected pancreatitis, gastritis, GERD, hepatitis, biliary colic, cholecystitis, dehydration, electrolyte abnormality, UTI. ? ?Patient uncomfortable appearing but in no acute distress, vital signs are unremarkable.  He has significant tenderness diffusely across his abdomen, greatest in the epigastrium.  With his recent alcohol consumption, symptoms are consistent with acute on chronic pancreatitis, patient noted to have mildly elevated lipase consistent with this.  Labs otherwise remarkable for mild hypokalemia with no other electrolyte abnormality or AKI, LFTs are within normal limits.  Patient continues to have significant dry heaving and pain despite IV morphine and Zofran.  We will continue to hydrate with IV fluids and treat with Compazine.  Case discussed with hospitalist for admission for acute on chronic pancreatitis. ? ?  ? ? ?FINAL CLINICAL IMPRESSION(S) / ED DIAGNOSES  ? ?Final diagnoses:  ?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([place])  ?Alcohol abuse  ? ? ? ?Rx / DC Orders  ? ?ED Discharge Orders   ? ? None  ? ?  ? ? ? ?Note:  This document was prepared using Dragon voice recognition software and may include unintentional dictation errors. ?  ?Blake Divine, MD ?12/21/21 2333 ? ?

## 2021-12-21 NOTE — ED Triage Notes (Signed)
FIRST NURSE NOTE:  Pt arrived via [facility] from home with c/o abd pain 10/10 started around 1pm hx of pancreatitis. Took 7 tylenol '500mg'$  at 1pm per EMS then 2 '500mg'$  Tylenol more at 4pm Pt has also had 2 shots of liqour ? ?170/98 p 83 cbg 127  ? ?Walks with a cane. ?

## 2021-12-21 NOTE — Assessment & Plan Note (Signed)
-   This is likely secondary to pain. ?- Pain management will be provided. ?- He will be placed on as needed IV labetalol. ?- We will continue his antihypertensives. ?

## 2021-12-21 NOTE — H&P (Addendum)
?  ?  ?[place] ? ? ?PATIENT NAME: Noah Velasquez   ? ?MR#:  [id number] ? ?DATE OF BIRTH:  [birth year] ? ?DATE OF ADMISSION:  12/21/2021 ? ?PRIMARY CARE PHYSICIAN: Jearld Fenton, NP  ? ?Patient is coming from: Home ? ?REQUESTING/REFERRING PHYSICIAN: Blake Divine, MD ?CHIEF COMPLAINT:  ? ?Chief Complaint  ?Patient presents with  ? Pancreatitis  ? ? ?HISTORY OF PRESENT ILLNESS:  ?Stephon Weathers is a 56 y.o. African-American male with medical history significant for chronic pancreatitis, stage III chronic kidney disease PTSD, GERD and osteoarthritis, presented to the ER with acute onset of epigastric abdominal pain with associated nausea and vomiting that started around 1 PM that he describes as being constant and sharp pain.  No bilious vomitus or hematemesis.  He denies any diarrhea.  He has been having tactile fever and chills.  He stated he drank 2 beers yesterday and then had one liquor shot today after the onset of his symptoms as he thought it may help his pain.  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  He admits to mild headache without dizziness or blurred vision. ? ?ED Course: When he came to the ER BP was 165/95 with otherwise normal vital signs and later BP was 172/96.  Labs revealed mild hyponatremia 132 and hypokalemia with anemia of 3.3 with total protein of 10.3 and lipase level was elevated at 134 compared to 31 on 12/08/2021.  CBC showed leukocytosis of 15.8 with neutrophilia.  Tylenol level was 10 and salicylate less than 7.  Alcohol level was 133. ?EKG as reviewed by me : EKG showed normal sinus rhythm with a rate to 83 with left intrafascicular block and flattened T waves anteroseptally. ?Imaging: None ordered. ? ?The patient was given 4 mg of IV morphine sulfate and 4 mg of IV Zofran as well as 1 L bolus of IV lactated Ringer's and 10 mg of IV Compazine.  He will be admitted to a medical telemetry bed for further evaluation and  management. ?PAST MEDICAL HISTORY:  ? ?Past Medical History:  ?Diagnosis Date  ? Chronic hip pain   ? Chronic pancreatitis ([place])   ? CKD (chronic kidney disease) stage 3, GFR 30-59 ml/min (HCC)   ? GERD (gastroesophageal reflux disease)   ? Hyperlipidemia   ? Hypertension   ? Osteoarthritis   ? Pancreatitis   ? Pancreatitis   ? PTSD (post-traumatic stress disorder)   ? Viral infection of left eye   ? ? ?PAST SURGICAL HISTORY:  ? ?Past Surgical History:  ?Procedure Laterality Date  ? pancreatic stent placement    ? ? ?SOCIAL HISTORY:  ? ?Social History  ? ?Tobacco Use  ? Smoking status: Every Day  ?  Packs/day: 0.25  ?  Years: 15.00  ?  Pack years: 3.75  ?  Types: Cigarettes  ? Smokeless tobacco: Never  ?Substance Use Topics  ? Alcohol use: Yes  ?  Alcohol/week: 7.0 standard drinks  ?  Types: 7 Cans of beer per week  ?  Comment: weekly  ? ? ?FAMILY HISTORY:  ? ?Family History  ?Problem Relation Age of Onset  ? Diabetes Mother   ? Sleep apnea Mother   ? Emphysema Sister   ? CAD Neg Hx   ? Mental illness Neg Hx   ? ? ?DRUG ALLERGIES:  ? ?Allergies  ?Allergen Reactions  ? Hctz [Hydrochlorothiazide] Swelling  ? ? ?REVIEW OF SYSTEMS:  ? ?  ROS ?As per history of present illness. All pertinent systems were reviewed above. Constitutional, HEENT, cardiovascular, respiratory, GI, GU, musculoskeletal, neuro, psychiatric, endocrine, integumentary and hematologic systems were reviewed and are otherwise negative/unremarkable except for positive findings mentioned above in the HPI. ? ? ?MEDICATIONS AT HOME:  ? ?Prior to Admission medications   ?Medication Sig Start Date End Date Taking? Authorizing Provider  ?albuterol (VENTOLIN HFA) 108 (90 Base) MCG/ACT inhaler Inhale 1-2 puffs into the lungs every 6 (six) hours as needed for wheezing or shortness of breath. 09/23/21   Jearld Fenton, NP  ?amLODipine (NORVASC) 10 MG tablet Take 1 tablet (10 mg total) by mouth daily. 06/11/21   Jearld Fenton, NP  ?azithromycin (ZITHROMAX Z-PAK)  250 MG tablet Take 2 tablets (500 mg) on  Day 1,  followed by 1 tablet (250 mg) once daily on Days 2 through 5. 12/08/21   Cuthriell, Charline Bills, PA-C  ?Blood Pressure KIT Please allow patient to chose from machines that are covered under insurance 08/30/20   Malfi, Lupita Raider, FNP  ?buPROPion (WELLBUTRIN XL) 150 MG 24 hr tablet Take 1 tablet (150 mg total) by mouth daily. 06/11/21   Jearld Fenton, NP  ?famotidine (PEPCID) 20 MG tablet TAKE 1 TABLET BY MOUTH TWICE DAILY 12/11/21   Jearld Fenton, NP  ?fluticasone (FLONASE) 50 MCG/ACT nasal spray Place 2 sprays into both nostrils daily. 12/10/21   Jearld Fenton, NP  ?gabapentin (NEURONTIN) 100 MG capsule Take 1 capsule (100 mg total) by mouth at bedtime as needed (sleep). 06/11/21   Jearld Fenton, NP  ?losartan (COZAAR) 100 MG tablet Take 1 tablet (100 mg total) by mouth daily. ?Patient not taking: Reported on 12/10/2021 06/11/21   Jearld Fenton, NP  ?naproxen (NAPROSYN) 500 MG tablet Take 1 tablet (500 mg total) by mouth 2 (two) times daily as needed for mild pain or moderate pain. 12/10/21   Jearld Fenton, NP  ?Olopatadine HCl 0.2 % SOLN APPLY 1 DROP TO EYE ONCE DAILY 07/23/21   Jearld Fenton, NP  ?omeprazole (PRILOSEC) 20 MG capsule TAKE 1 CAPSULE BY MOUTH ONCE DAILY 10/17/21   Jearld Fenton, NP  ?Pancrelipase, Lip-Prot-Amyl, (ZENPEP) 15000-47000 units CPEP TAKE 1 CAPSULE BY MOUTH 3 (THREE) TIMES DAILY WITH MEALS. 01/23/21   Jearld Fenton, NP  ?sertraline (ZOLOFT) 100 MG tablet Take 1 tablet (100 mg total) by mouth daily. 12/10/21   Jearld Fenton, NP  ?traZODone (DESYREL) 150 MG tablet Take 1 tablet (150 mg total) by mouth at bedtime as needed for sleep. 10/20/18   Mikey College, NP  ? ?  ? ?VITAL SIGNS:  ?Blood pressure (!) 172/96, pulse 88, temperature 98 ?F (36.7 ?C), temperature source Oral, resp. rate 15, SpO2 94 %. ? ?PHYSICAL EXAMINATION:  ?Physical Exam ? ?GENERAL:  56 y.o.-year-old African-American male patient lying in the bed with no acute  distress.  ?EYES: Pupils equal, round, reactive to light and accommodation. No scleral icterus. Extraocular muscles intact.  ?HEENT: Head atraumatic, normocephalic. Oropharynx and nasopharynx clear.  ?NECK:  Supple, no jugular venous distention. No thyroid enlargement, no tenderness.  ?LUNGS: Normal breath sounds bilaterally, no wheezing, rales,rhonchi or crepitation. No use of accessory muscles of respiration.  ?CARDIOVASCULAR: Regular rate and rhythm, S1, S2 normal. No murmurs, rubs, or gallops.  ?ABDOMEN: Soft, nondistended, with epigastric and left upper quadrant abdominal tenderness without rebound tenderness guarding or rigidity.  Bowel sounds present. No organomegaly or mass.  ?EXTREMITIES: No pedal edema,  cyanosis, or clubbing.  ?NEUROLOGIC: Cranial nerves II through XII are intact. Muscle strength 5/5 in all extremities. Sensation intact. Gait not checked.  ?PSYCHIATRIC: The patient is alert and oriented x 3.  Normal affect and good eye contact. ?SKIN: No obvious rash, lesion, or ulcer.  ? ?LABORATORY PANEL:  ? ?CBC ?Recent Labs  ?Lab 12/21/21 ?2111  ?WBC 15.8*  ?HGB 15.1  ?HCT 45.9  ?PLT 389  ? ?------------------------------------------------------------------------------------------------------------------ ? ?Chemistries  ?Recent Labs  ?Lab 12/21/21 ?2111  ?NA 132*  ?K 3.3*  ?CL 99  ?CO2 22  ?GLUCOSE 123*  ?BUN 12  ?CREATININE 0.93  ?CALCIUM 8.9  ?AST 23  ?ALT 14  ?ALKPHOS 74  ?BILITOT 0.5  ? ?------------------------------------------------------------------------------------------------------------------ ? ?Cardiac Enzymes ?No results for input(s): TROPONINI in the last 168 hours. ?------------------------------------------------------------------------------------------------------------------ ? ?RADIOLOGY:  ?No results found. ? ? ? ?IMPRESSION AND PLAN:  ?Assessment and Plan: ?* Acute alcoholic pancreatitis ?- The patient will admitted to a medical telemetry bed. ?- He will be kept npo. ?- We will  follow serial lipase levels. ?- Pain management will be provided. ?- He was counseled for alcohol cessation. ? ?Hypertensive urgency ?- This is likely secondary to pain. ?- Pain management will be provided. ?- He will be p

## 2021-12-21 NOTE — ED Triage Notes (Signed)
Pt reports chronic pancreatitis and had 2 shots of liquor tonight. Pt reports N/V  ?

## 2021-12-22 DIAGNOSIS — K852 Alcohol induced acute pancreatitis without necrosis or infection: Secondary | ICD-10-CM | POA: Diagnosis not present

## 2021-12-22 DIAGNOSIS — K219 Gastro-esophageal reflux disease without esophagitis: Secondary | ICD-10-CM

## 2021-12-22 DIAGNOSIS — F10929 Alcohol use, unspecified with intoxication, unspecified: Secondary | ICD-10-CM

## 2021-12-22 DIAGNOSIS — G629 Polyneuropathy, unspecified: Secondary | ICD-10-CM

## 2021-12-22 LAB — CBC
HCT: 41.8 % (ref 39.0–52.0)
Hemoglobin: 13.8 g/dL (ref 13.0–17.0)
MCH: 30.4 pg (ref 26.0–34.0)
MCHC: 33 g/dL (ref 30.0–36.0)
MCV: 92.1 fL (ref 80.0–100.0)
Platelets: 372 10*3/uL (ref 150–400)
RBC: 4.54 MIL/uL (ref 4.22–5.81)
RDW: 13 % (ref 11.5–15.5)
WBC: 15.3 10*3/uL — ABNORMAL HIGH (ref 4.0–10.5)
nRBC: 0 % (ref 0.0–0.2)

## 2021-12-22 LAB — COMPREHENSIVE METABOLIC PANEL
ALT: 12 U/L (ref 0–44)
AST: 21 U/L (ref 15–41)
Albumin: 3.3 g/dL — ABNORMAL LOW (ref 3.5–5.0)
Alkaline Phosphatase: 69 U/L (ref 38–126)
Anion gap: 8 (ref 5–15)
BUN: 8 mg/dL (ref 6–20)
CO2: 24 mmol/L (ref 22–32)
Calcium: 8.7 mg/dL — ABNORMAL LOW (ref 8.9–10.3)
Chloride: 101 mmol/L (ref 98–111)
Creatinine, Ser: 0.92 mg/dL (ref 0.61–1.24)
GFR, Estimated: >60 mL/min (ref >60)
Glucose, Bld: 132 mg/dL — ABNORMAL HIGH (ref 70–99)
Potassium: 3.9 mmol/L (ref 3.5–5.1)
Sodium: 133 mmol/L — ABNORMAL LOW (ref 135–145)
Total Bilirubin: 0.8 mg/dL (ref 0.3–1.2)
Total Protein: 9.1 g/dL — ABNORMAL HIGH (ref 6.5–8.1)

## 2021-12-22 LAB — LIPASE, BLOOD: Lipase: 138 U/L — ABNORMAL HIGH (ref 11–51)

## 2021-12-22 MED ORDER — THIAMINE HCL 100 MG/ML IJ SOLN
100.0000 mg | Freq: Every day | INTRAMUSCULAR | Status: DC
  Filled 2021-12-22: qty 2

## 2021-12-22 MED ORDER — ADULT MULTIVITAMIN W/MINERALS CH
1.0000 | ORAL_TABLET | Freq: Every day | ORAL | Status: DC
  Administered 2021-12-22 – 2021-12-24 (×3): 1 via ORAL
  Filled 2021-12-22 (×3): qty 1

## 2021-12-22 MED ORDER — ALBUTEROL SULFATE (2.5 MG/3ML) 0.083% IN NEBU: 2.5000 mg | INHALATION_SOLUTION | Freq: Four times a day (QID) | RESPIRATORY_TRACT | Status: DC | PRN

## 2021-12-22 MED ORDER — FOLIC ACID 1 MG PO TABS
1.0000 mg | ORAL_TABLET | Freq: Every day | ORAL | Status: DC
  Administered 2021-12-22 – 2021-12-24 (×3): 1 mg via ORAL
  Filled 2021-12-22 (×3): qty 1

## 2021-12-22 MED ORDER — MORPHINE SULFATE (PF) 2 MG/ML IV SOLN
2.0000 mg | INTRAVENOUS | Status: DC | PRN
  Administered 2021-12-22 – 2021-12-24 (×4): 2 mg via INTRAVENOUS
  Filled 2021-12-22 (×4): qty 1

## 2021-12-22 MED ORDER — LOSARTAN POTASSIUM 50 MG PO TABS
100.0000 mg | ORAL_TABLET | Freq: Every day | ORAL | Status: DC
  Administered 2021-12-23 – 2021-12-24 (×2): 100 mg via ORAL
  Filled 2021-12-22 (×3): qty 2

## 2021-12-22 MED ORDER — KETOROLAC TROMETHAMINE 15 MG/ML IJ SOLN
15.0000 mg | Freq: Four times a day (QID) | INTRAMUSCULAR | Status: DC | PRN
  Administered 2021-12-23 (×2): 15 mg via INTRAVENOUS
  Filled 2021-12-22 (×2): qty 1

## 2021-12-22 MED ORDER — LORAZEPAM 1 MG PO TABS: 1.0000 mg | ORAL_TABLET | ORAL | Status: DC | PRN

## 2021-12-22 MED ORDER — THIAMINE HCL 100 MG PO TABS
100.0000 mg | ORAL_TABLET | Freq: Every day | ORAL | Status: DC
  Administered 2021-12-22 – 2021-12-24 (×3): 100 mg via ORAL
  Filled 2021-12-22 (×3): qty 1

## 2021-12-22 MED ORDER — LORAZEPAM 2 MG/ML IJ SOLN
1.0000 mg | INTRAMUSCULAR | Status: DC | PRN
  Administered 2021-12-22: 2 mg via INTRAVENOUS
  Filled 2021-12-22: qty 1

## 2021-12-22 NOTE — Assessment & Plan Note (Signed)
-   We will keep any PPI therapy. ?

## 2021-12-22 NOTE — Assessment & Plan Note (Signed)
-   This is associated with alcohol abuse. ?- He will be monitored for alcohol withdrawal. ?- We will place him on CIWA protocol. ?

## 2021-12-22 NOTE — Assessment & Plan Note (Signed)
-   We will continue Neurontin. ?

## 2021-12-22 NOTE — ED Notes (Signed)
Report received from [place] RN. Patient care assumed. Patient/RN introduction complete. Will continue to monitor. Pt awaiting hospital room availability.  ?

## 2021-12-22 NOTE — ED Notes (Signed)
Report given to [facility], pt moved to CPOD.  ?

## 2021-12-22 NOTE — ED Notes (Signed)
Pt 89% on RA. Placed back on 3 L [place] ?

## 2021-12-22 NOTE — Assessment & Plan Note (Signed)
-   We will continue Wellbutrin XL and Zoloft as well as trazodone. ?

## 2021-12-22 NOTE — ED Notes (Signed)
Informed RN bed assigned 

## 2021-12-22 NOTE — Progress Notes (Signed)
Few ice chips provided; okay per primary RN ? ?Admission profile udpated. ?

## 2021-12-22 NOTE — ED Notes (Signed)
Pt placed on 2L of oxygen overnight at this time due to oxygen saturation dropping to 89% while sleeping ?

## 2021-12-22 NOTE — Progress Notes (Signed)
Noah Velasquez at [facility] ? ? ?PATIENT NAME: Noah Velasquez   ? ?MR#:  [id number] ? ?DATE OF BIRTH:  [birth year] ? ?SUBJECTIVE:  ?patient came in with abdominal pain. He has history of chronic pancreatitis. Drank some liquor. [place] elevated 131. No vomiting. No family at bedside. Blood alcohol level 133 ? ?VITALS:  ?Blood pressure (!) 153/101, pulse 93, temperature 98.6 ?F (37 ?C), temperature source Oral, resp. rate 15, SpO2 99 %. ? ?PHYSICAL EXAMINATION:  ? ?GENERAL:  56 y.o.-year-old patient lying in the bed with no acute distress. disheveled ?LUNGS: Normal breath sounds bilaterally, no wheezing, rales, rhonchi.  ?CARDIOVASCULAR: S1, S2 normal. No murmurs, rubs, or gallops.  ?ABDOMEN: Soft, +tender, nondistended. Bowel sounds present.  ?EXTREMITIES: No  edema b/l.    ?NEUROLOGIC: nonfocal  patient is alert and awake ?SKIN: No obvious rash, lesion, or ulcer.  ? ?LABORATORY PANEL:  ?CBC ?Recent Labs  ?Lab 12/22/21 ?3903  ?WBC 15.3*  ?HGB 13.8  ?HCT 41.8  ?PLT 372  ? ? ?Chemistries  ?Recent Labs  ?Lab 12/22/21 ?0092  ?NA 133*  ?K 3.9  ?CL 101  ?CO2 24  ?GLUCOSE 132*  ?BUN 8  ?CREATININE 0.92  ?CALCIUM 8.7*  ?AST 21  ?ALT 12  ?ALKPHOS 69  ?BILITOT 0.8  ? ? ?Assessment and Plan ?Noah Velasquez is a 57 y.o. African-American male with medical history significant for chronic pancreatitis, stage III chronic kidney disease PTSD, GERD and osteoarthritis, presented to the ER with acute onset of epigastric abdominal pain with associated nausea and vomiting that started around 1 PM that he describes as being constant and sharp pain. ? He stated he drank 2 beers yesterday and then had one liquor shot today after the onset of his symptoms as he thought it may help his pain.  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   ? ?Acute alcoholic pancreatitis ?- will start on clear liquid ?- life is 131  ?- PRN pain management  ?- He was counseled for alcohol  cessation. ?  ?Hypertensive urgency ?history of hypertension ?- precipitated likely secondary to pain. ?- Continue amlodipine and losartan ? ?Alcohol intoxication ([place]) ?-  on CIWA protocol. ?  ?GERD without esophagitis ?- PPI therapy. ?  ?MDD (major depressive disorder), recurrent, in full remission ([place]) ?-  continue Wellbutrin XL and Zoloft  ?  ?Peripheral neuropathy ?-  continue Neurontin. ?  ? ?Procedures: ?Family communication : no family at bedside ?Consults : none ?CODE STATUS: full ?DVT Prophylaxis : enoxaparin ?Level of care: Telemetry Medical ?Status is: Inpatient ?Remains inpatient appropriate because: acute on chronic pancreatitis with ongoing alcohol abuse ?  ? ?TOTAL TIME TAKING CARE OF THIS PATIENT: 35 minutes.  ?>50% time spent on counselling and coordination of care ? ?Note: This dictation was prepared with Dragon dictation along with smaller phrase technology. Any transcriptional errors that result from this process are unintentional. ? ?Fritzi Mandes M.D  ? ? ?[facility]  ? ?CC: ?Primary care physician; Jearld Fenton, NP  ?

## 2021-12-23 DIAGNOSIS — K852 Alcohol induced acute pancreatitis without necrosis or infection: Secondary | ICD-10-CM | POA: Diagnosis not present

## 2021-12-23 LAB — HIV ANTIBODY (ROUTINE TESTING W REFLEX): HIV Screen 4th Generation wRfx: NONREACTIVE

## 2021-12-23 LAB — LIPASE, BLOOD: Lipase: 122 U/L — ABNORMAL HIGH (ref 11–51)

## 2021-12-23 MED ORDER — TRAMADOL HCL 50 MG PO TABS
50.0000 mg | ORAL_TABLET | Freq: Four times a day (QID) | ORAL | Status: DC | PRN
  Administered 2021-12-23 – 2021-12-24 (×2): 50 mg via ORAL
  Filled 2021-12-23 (×2): qty 1

## 2021-12-23 NOTE — Progress Notes (Signed)
Mobility Specialist - Progress Note ? ? 12/23/21 1500  ?Mobility  ?Activity Ambulated independently in hallway;Stood at bedside;Dangled on edge of bed  ?Level of Assistance Standby assist, set-up cues, supervision of patient - no hands on  ?Assistive Device [place]  ?Distance Ambulated (ft) 120 ft  ?Activity Response Tolerated well  ?$Mobility charge 1 Mobility  ? ? ? ?Pre-mobility: 85 HR, 92% SpO2 ?During mobility: 96-98 HR,  90-93% SpO2 ?Post-mobility: 87 HR, 88-92% SPO2 ? ?Pt supine in bed upon arrival using RA. Completes bed mobility to sit EOB ModI and STS with supervision -- lack of IV pole awareness. Ambulates with supervision and is left in bed with needs in reach. RN notified. ? ?Noah Velasquez ?Mobility Specialist ?12/23/21, 4:05 PM ? ? ?

## 2021-12-23 NOTE — Progress Notes (Signed)
[place] at [facility] ? ? ?PATIENT NAME: Noah Velasquez   ? ?MR#:  [id number] ? ?DATE OF BIRTH:  22-Jan-1966 ? ?SUBJECTIVE:  ?patient came in with abdominal pain. He has history of chronic pancreatitis. Drank some liquor. Lipase elevated 131. No vomiting. No family at bedside. Blood alcohol level 133 ? ?patient states he feels better after shower. He still has some abdominal pain however tolerating PO diet and wants to advance to full liquid diet ? ?VITALS:  ?Blood pressure 122/80, pulse 85, temperature 97.9 ?F (36.6 ?C), temperature source Oral, resp. rate 18, SpO2 97 %. ? ?PHYSICAL EXAMINATION:  ? ?GENERAL:  56 y.o.-year-old patient lying in the bed with no acute distress. disheveled ?LUNGS: Normal breath sounds bilaterally, no wheezing, rales, rhonchi.  ?CARDIOVASCULAR: S1, S2 normal. No murmurs, rubs, or gallops.  ?ABDOMEN: Soft, +tender, nondistended. Bowel sounds present.  ?EXTREMITIES: No  edema b/l.    ?NEUROLOGIC: nonfocal  patient is alert and awake ?SKIN: No obvious rash, lesion, or ulcer.  ? ?LABORATORY PANEL:  ?CBC ?Recent Labs  ?Lab 12/22/21 ?1093  ?WBC 15.3*  ?HGB 13.8  ?HCT 41.8  ?PLT 372  ? ? ? ?Chemistries  ?Recent Labs  ?Lab 12/22/21 ?2355  ?NA 133*  ?K 3.9  ?CL 101  ?CO2 24  ?GLUCOSE 132*  ?BUN 8  ?CREATININE 0.92  ?CALCIUM 8.7*  ?AST 21  ?ALT 12  ?ALKPHOS 69  ?BILITOT 0.8  ? ? ? ?Assessment and Plan ?Noah Velasquez is a 57 y.o. African-American male with medical history significant for chronic pancreatitis, stage III chronic kidney disease PTSD, GERD and osteoarthritis, presented to the ER with acute onset of epigastric abdominal pain with associated nausea and vomiting that started around 1 PM that he describes as being constant and sharp pain. ? He stated he drank 2 beers yesterday and then had one liquor shot today after the onset of his symptoms as he thought it may help his pain.  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   ? ?Acute alcoholic pancreatitis ?- will start on clear liquid--to FLD ?- lipase is 131 --122 ?- PRN pain management  ?- He was counseled for alcohol cessation. ?  ?Hypertensive urgency ?history of hypertension ?- precipitated likely secondary to pain. ?- Continue amlodipine and losartan ? ?Alcohol intoxication ([place]) ?-  on CIWA protocol. ?-- Currently scoring zero ?  ?GERD without esophagitis ?- PPI therapy. ?  ?MDD (major depressive disorder), recurrent, in full remission ([place]) ?-  continue Wellbutrin XL and Zoloft  ?  ?Peripheral neuropathy ?-  continue Neurontin. ?  ? ? ?Family communication : no family at bedside ?Consults : none ?CODE STATUS: full ?DVT Prophylaxis : enoxaparin ?Level of care: Telemetry Medical ?Status is: Inpatient ?Remains inpatient appropriate because: acute on chronic pancreatitis with ongoing alcohol abuse ?  ?will advance to full liquid diet. Continue monitor lipase. If continues to show improvement likely discharge 1 to 2 days. Patient is in agreement ?TOTAL TIME TAKING CARE OF THIS PATIENT: 35 minutes.  ?>50% time spent on counselling and coordination of care ? ?Note: This dictation was prepared with Dragon dictation along with smaller phrase technology. Any transcriptional errors that result from this process are unintentional. ? ?Fritzi Mandes M.D  ? ? ?[facility]  ? ?CC: ?Primary care physician; Noah Fenton, NP  ?

## 2021-12-24 DIAGNOSIS — K852 Alcohol induced acute pancreatitis without necrosis or infection: Secondary | ICD-10-CM | POA: Diagnosis not present

## 2021-12-24 LAB — LIPASE, BLOOD: Lipase: 35 U/L (ref 11–51)

## 2021-12-24 NOTE — Care Management (Signed)
No TOC needs per care team.  Please send referral if needs are discovered.   ? ?

## 2021-12-24 NOTE — Discharge Summary (Signed)
?Physician Discharge Summary ?  ?Patient: Noah Velasquez MRN: [id number] DOB: July 12, 1966  ?Admit date:     12/21/2021  ?Discharge date: 12/24/21  ?Discharge Physician: Fritzi Mandes  ? ?PCP: Jearld Fenton, NP  ? ?Recommendations at discharge:  ? ? F/u PCP in 1-2 weeks ?Abstain from drinking alcohol ? ?Discharge Diagnoses: ?acute on chronic alcoholic pancreatitis ?ongoing alcohol abuse ?hypertension ? ?Hospital Course: ? ?Jacoby Zanni is a 56 y.o. African-American male with medical history significant for chronic pancreatitis, stage III chronic kidney disease PTSD, GERD and osteoarthritis, presented to the ER with acute onset of epigastric abdominal pain with associated nausea and vomiting that started around 1 PM that he describes as being constant and sharp pain. ? He stated he drank 2 beers yesterday and then had one liquor shot today after the onset of his symptoms as he thought it may help his pain.  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   ?  ?Acute alcoholic pancreatitis ?- will start on clear liquid--to FLD ?- lipase is 131 --122--35 ?- PRN pain management  ?- He was counseled for alcohol cessation. ?  ?Hypertensive urgency ?history of hypertension ?- precipitated likely secondary to pain. ?- Continue amlodipine and losartan ?  ?Alcohol intoxication ([place]) ?-  on CIWA protocol. ?-- Currently scoring zero ?  ?GERD without esophagitis ?- PPI therapy. ?  ?MDD (major depressive disorder), recurrent, in full remission ([place]) ?-  continue Wellbutrin XL and Zoloft  ?  ?Peripheral neuropathy ?-  continue Neurontin. ?  ?Patient overall hemodynamically stable. He is advised to abstain from alcohol. He'll follow-up with his PCP as outpatient. No family at bedside. Discharged to home. Patient in agreement ?  ?Family communication : no family at bedside ?Consults : none ?CODE STATUS: full ?DVT Prophylaxis : enoxaparin ? ?  ? ? ?Disposition: Home ?Diet recommendation:   ?Discharge Diet Orders (From admission, onward)  ? ?  Start     Ordered  ? 12/24/21 0000  Diet - low sodium heart healthy       ? 12/24/21 1238  ? ?  ?  ? ?  ? ?Cardiac diet ?DISCHARGE MEDICATION: ?Allergies as of 12/24/2021   ? ?   Reactions  ? Hctz [hydrochlorothiazide] Swelling  ? ?  ? ?  ?Medication List  ?  ? ?STOP taking these medications   ? ?azithromycin 250 MG tablet ?Commonly known as: Zithromax Z-Pak ?  ?losartan 100 MG tablet ?Commonly known as: COZAAR ?  ? ?  ? ?TAKE these medications   ? ?albuterol 108 (90 Base) MCG/ACT inhaler ?Commonly known as: VENTOLIN HFA ?Inhale 1-2 puffs into the lungs every 6 (six) hours as needed for wheezing or shortness of breath. ?  ?amLODipine 10 MG tablet ?Commonly known as: NORVASC ?Take 1 tablet (10 mg total) by mouth daily. ?  ?Blood Pressure Kit ?Please allow patient to chose from machines that are covered under insurance ?  ?buPROPion 150 MG 24 hr tablet ?Commonly known as: Wellbutrin XL ?Take 1 tablet (150 mg total) by mouth daily. ?  ?famotidine 20 MG tablet ?Commonly known as: PEPCID ?TAKE 1 TABLET BY MOUTH TWICE DAILY ?  ?fluticasone 50 MCG/ACT nasal spray ?Commonly known as: FLONASE ?Place 2 sprays into both nostrils daily. ?  ?gabapentin 100 MG capsule ?Commonly known as: NEURONTIN ?Take 1 capsule (100 mg total) by mouth at bedtime as needed (sleep). ?  ?naproxen 500 MG tablet ?Commonly known as:  NAPROSYN ?Take 1 tablet (500 mg total) by mouth 2 (two) times daily as needed for mild pain or moderate pain. ?  ?Olopatadine HCl 0.2 % Soln ?APPLY 1 DROP TO EYE ONCE DAILY ?  ?omeprazole 20 MG capsule ?Commonly known as: PRILOSEC ?TAKE 1 CAPSULE BY MOUTH ONCE DAILY ?  ?sertraline 100 MG tablet ?Commonly known as: ZOLOFT ?Take 1 tablet (100 mg total) by mouth daily. ?  ?traZODone 150 MG tablet ?Commonly known as: DESYREL ?Take 1 tablet (150 mg total) by mouth at bedtime as needed for sleep. ?  ?Zenpep 78295-62130 units Cpep ?Generic drug: Pancrelipase  (Lip-Prot-Amyl) ?TAKE 1 CAPSULE BY MOUTH 3 (THREE) TIMES DAILY WITH MEALS. ?  ? ?  ? ? Follow-up Information   ? ? Jearld Fenton, NP. Schedule an appointment as soon as possible for a visit in 1 week(s).   ?Specialties: Internal Medicine, Emergency Medicine ?Contact information: ?[address] [place] [address] ?[phone] ? ? ?  ?  ? ?  ?  ? ?  ? ?Discharge Exam: ?There were no vitals filed for this visit. ? ? ?Condition at discharge: fair ? ?The results of significant diagnostics from this hospitalization (including imaging, microbiology, ancillary and laboratory) are listed below for reference.  ? ?Imaging Studies: ?DG Chest 2 View ? ?Result Date: 12/08/2021 ?CLINICAL DATA:  Shortness of breath EXAM: CHEST - 2 VIEW COMPARISON:  09/23/2021, 04/04/2018 FINDINGS: Mild diffuse reticular interstitial opacity consistent with chronic lung disease. Increased airspace disease at both bases suspicious for superimposed atelectasis or mild pneumonia. No pleural effusion. Normal cardiac size with aortic atherosclerosis. IMPRESSION: Evidence of underlying chronic lung disease. New hazy basilar opacities suspicious for superimposed atelectasis or pneumonia Electronically Signed   By: Donavan Foil M.D.   On: 12/08/2021 20:26  ? ?CT ABDOMEN PELVIS W CONTRAST ? ?Result Date: 12/08/2021 ?CLINICAL DATA:  Epigastric pain, mid back pain. EXAM: CT ABDOMEN AND PELVIS WITH CONTRAST TECHNIQUE: Multidetector CT imaging of the abdomen and pelvis was performed using the standard protocol following bolus administration of intravenous contrast. RADIATION DOSE REDUCTION: This exam was performed according to the departmental dose-optimization program which includes automated exposure control, adjustment of the mA and/or kV according to patient size and/or use of iterative reconstruction technique. CONTRAST:  135m OMNIPAQUE IOHEXOL 300 MG/ML  SOLN COMPARISON:  12/05/2019 FINDINGS: Lower chest: Scarring in the lung bases.  No acute  abnormality. Hepatobiliary: No focal hepatic abnormality. Gallbladder unremarkable. Pancreas: Extensive calcifications throughout the pancreas compatible with chronic pancreatitis, unchanged since prior study. Previously seen cystic area in the pancreatic tail has resolved. No evidence for acute pancreatitis. Small cystic area in the pancreatic head measures 11 mm. Spleen: No focal abnormality.  Normal size. Adrenals/Urinary Tract: Areas of cortical thinning/scarring bilaterally. No hydronephrosis or renal mass. Adrenal glands and urinary bladder unremarkable. Stomach/Bowel: Normal appendix. Stomach, large and small bowel grossly unremarkable. Vascular/Lymphatic: Heavily calcified aorta and iliac vessels. No evidence of aneurysm or adenopathy. Reproductive: No visible focal abnormality. Other: No free fluid or free air. Musculoskeletal: No acute bony abnormality. Advanced degenerative changes in the right hip. IMPRESSION: Changes of chronic calcific pancreatitis. No changes of acute pancreatitis. 11 mm cystic area within the pancreatic head. This is new since prior study. This could reflect small pseudocyst although other cystic lesion cannot be completely excluded. This could be further evaluated with elective outpatient MRI if felt clinically indicated. Areas of scarring/cortical thinning in the kidneys bilaterally. Aortic atherosclerosis. Electronically Signed   By: KRolm BaptiseM.D.  On: 12/08/2021 20:44   ? ?Microbiology: ?Results for orders placed or performed during the hospital encounter of 12/05/19  ?Blood Culture (routine x 2)     Status: Abnormal  ? Collection Time: 12/05/19 10:16 AM  ? Specimen: BLOOD  ?Result Value Ref Range Status  ? Specimen Description   Final  ?  BLOOD BLOOD LEFT WRIST ?Performed at [facility], [address] [place], [place] [address] ?  ? Special Requests   Final  ?  BOTTLES DRAWN AEROBIC AND ANAEROBIC Blood Culture adequate volume ?Performed at [facility], [address] [place], [place] [address] ?  ? Culture  Setup Time   Final  ?  GRAM NEGATIVE RODS ?AEROBIC BOTTLE ONLY ?CRITICAL RESULT CALLED TO, READ BACK BY AND VERIFIED WITH: WALID NAZARI AT 0831 ON 12/06/19 SNG ?Performed at [facility]

## 2021-12-24 NOTE — Progress Notes (Signed)
Patient being discharged home. IV removed. Went over discharge instructions with patient before discharge. Patient going home POV with family member.  ?

## 2021-12-25 ENCOUNTER — Telehealth: Payer: Self-pay

## 2021-12-25 DIAGNOSIS — F3110 Bipolar disorder, current episode manic without psychotic features, unspecified: Secondary | ICD-10-CM | POA: Diagnosis not present

## 2021-12-25 DIAGNOSIS — M069 Rheumatoid arthritis, unspecified: Secondary | ICD-10-CM | POA: Diagnosis not present

## 2021-12-25 NOTE — Telephone Encounter (Signed)
Transition Care Management Follow-up Telephone Call ?Date of discharge and from where: 12/24/2021-[facility] ?How have you been since you were released from the hospital? Pt sated he is doing fine.  ?Any questions or concerns? No ? ?Items Reviewed: ?Did the pt receive and understand the discharge instructions provided? Yes  ?Medications obtained and verified?  No new medications given at discharge ?Other? No  ?Any new allergies since your discharge? No  ?Dietary orders reviewed? No ?Do you have support at home? Yes  ? ?Home Care and Equipment/Supplies: ?Were home health services ordered? not applicable ?If so, what is the name of the agency? N/A  ?Has the agency set up a time to come to the patient's home? not applicable ?Were any new equipment or medical supplies ordered?  No ?What is the name of the medical supply agency? N/A ?Were you able to get the supplies/equipment? not applicable ?Do you have any questions related to the use of the equipment or supplies? No ? ?Functional Questionnaire: (I = Independent and D = Dependent) ?ADLs: I ? ?Bathing/Dressing- I ? ?Meal Prep- I ? ?Eating- I ? ?Maintaining continence- I ? ?Transferring/Ambulation- I ? ?Managing Meds- I ? ?Follow up appointments reviewed: ? ?PCP Hospital f/u appt confirmed? Yes  Scheduled to see PCP on 12/31/2021. ?[facility] f/u appt confirmed? No   ?Are transportation arrangements needed? No  ?If their condition worsens, is the pt aware to call PCP or go to the Emergency Dept.? Yes ?Was the patient provided with contact information for the PCP's office or ED? Yes ?Was to pt encouraged to call back with questions or concerns? Yes  ?

## 2021-12-26 ENCOUNTER — Telehealth: Payer: Self-pay

## 2021-12-26 DIAGNOSIS — M069 Rheumatoid arthritis, unspecified: Secondary | ICD-10-CM | POA: Diagnosis not present

## 2021-12-26 NOTE — Telephone Encounter (Signed)
NA @ 3:28pm ?

## 2021-12-29 ENCOUNTER — Other Ambulatory Visit: Payer: Self-pay | Admitting: Internal Medicine

## 2021-12-29 DIAGNOSIS — R0602 Shortness of breath: Secondary | ICD-10-CM

## 2021-12-29 DIAGNOSIS — M069 Rheumatoid arthritis, unspecified: Secondary | ICD-10-CM | POA: Diagnosis not present

## 2021-12-30 DIAGNOSIS — M069 Rheumatoid arthritis, unspecified: Secondary | ICD-10-CM | POA: Diagnosis not present

## 2021-12-31 ENCOUNTER — Ambulatory Visit (INDEPENDENT_AMBULATORY_CARE_PROVIDER_SITE_OTHER): Payer: Medicaid Other | Admitting: Internal Medicine

## 2021-12-31 ENCOUNTER — Encounter: Payer: Self-pay | Admitting: Internal Medicine

## 2021-12-31 ENCOUNTER — Ambulatory Visit: Payer: Medicaid Other

## 2021-12-31 VITALS — BP 128/84 | HR 75 | Temp 97.5°F | Wt 170.0 lb

## 2021-12-31 DIAGNOSIS — Z23 Encounter for immunization: Secondary | ICD-10-CM

## 2021-12-31 DIAGNOSIS — M069 Rheumatoid arthritis, unspecified: Secondary | ICD-10-CM | POA: Diagnosis not present

## 2021-12-31 DIAGNOSIS — K860 Alcohol-induced chronic pancreatitis: Secondary | ICD-10-CM | POA: Diagnosis not present

## 2021-12-31 DIAGNOSIS — I10 Essential (primary) hypertension: Secondary | ICD-10-CM | POA: Diagnosis not present

## 2021-12-31 NOTE — Telephone Encounter (Signed)
Requested Prescriptions  ?Pending Prescriptions Disp Refills  ?? VENTOLIN HFA 108 (90 Base) MCG/ACT inhaler [Pharmacy Med Name: VENTOLIN HFA 108 (90 BASE) MCG/ACT] 18 g 0  ?  Sig: INHALE 1-2 PUFFS INTO THE LLUNGS EVERY 6HOURS AS NEEDED FOR WHEEZING OR SHORTNESS OF BREATH  ?  ? Pulmonology:  Beta Agonists 2 Failed - 12/29/2021  4:59 PM  ?  ?  Failed - Last BP in normal range  ?  BP Readings from Last 1 Encounters:  ?12/24/21 (!) 149/93  ?   ?  ?  Passed - Last Heart Rate in normal range  ?  Pulse Readings from Last 1 Encounters:  ?12/24/21 90  ?   ?  ?  Passed - Valid encounter within last 12 months  ?  Recent Outpatient Visits   ?      ? 3 weeks ago LUQ pain  ? [facility] [place], [place] W, NP  ? 3 months ago Shortness of breath  ? [facility] [place], Coralie Keens, NP  ? 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 [facility] [place], [place] W, NP  ? 11 months ago Posttraumatic stress disorder  ? [facility], Coralie Keens, NP  ? 1 year ago Essential hypertension  ? [facility], Lupita Raider, FNP  ?  ?  ?Future Appointments   ?        ? Today Jearld Fenton, NP [facility], [place]  ?  ? ?  ?  ?  ? ? ?

## 2021-12-31 NOTE — Addendum Note (Signed)
Addended by: Ashley Royalty E on: 12/31/2021 11:57 AM ? ? Modules accepted: Orders ? ?

## 2021-12-31 NOTE — Patient Instructions (Signed)
Pancreatic Cancer ? ?The pancreas is a gland in the abdomen between the stomach and the spine. The pancreas makes hormones that control blood sugar and helps the body use and store energy that comes from food. The pancreas also makes enzymes that help digest food. Pancreatic cancer is when there is a tumor in the pancreas that is cancerous (malignant). ?There are two types of pancreatic cancer: ?Exocrine. This is the most common type. ?Endocrine. This is also called islet cell cancer. ?Pancreatic cancer can spread (metastasize) to other parts of the body. ?What are the causes? ?The exact cause of this condition is not known. ?What increases the risk? ?The following factors may make you more likely to develop this condition: ?Being over 63 years old. ?Smoking cigarettes. ?Having a family history of cancer of the pancreas, colon, or ovaries. ?Having diabetes. ?Having long-term inflammation of the pancreas (chronic pancreatitis). ?Being exposed to certain chemicals. ?Being obese and having a decreased level of physical activity. ?Eating a diet that is high in fat and red meat. ?Having certain hereditary conditions. ?What are the signs or symptoms? ?In the early stages, there are often no symptoms of this condition. As the cancer gets worse, symptoms may vary depending on the type of pancreatic cancer you have. Common symptoms include: ?Nausea and vomiting. ?Loss of appetite and unintended weight loss. ?Pain in the upper abdomen or upper back. ?Skin or the white parts of the eyes turning yellow (jaundice). ?Fatigue. ?Other symptoms include: ?Itchy skin. ?Dark urine. ?Stools that are light-colored and greasy-looking, or stools that are black and tarry-looking. ?A lump under the rib cage on the right side. ?High blood sugar (hyperglycemia). This may cause increased thirst and frequent urination. ?Low blood sugar (hypoglycemia). This may cause confusion, sweating, and a fast heartbeat. ?Depression. ?How is this  diagnosed? ?This condition may be diagnosed based on your medical history and a physical exam. Your health care provider may check your: ?Skin and eyes for signs of jaundice. ?Abdomen for any changes in the areas near the pancreas. Your health care provider may also check for excess fluid in the abdomen. ?You may also have other tests, including: ?Blood and urine tests. ?Genetic testing. ?Ultrasound. ?CT scan. ?MRI. ?A biopsy. A sample of pancreatic tissue would be removed and looked at under a microscope. ?If pancreatic cancer is diagnosed, it will be staged to determine its severity and extent. Staging is an assessment of: ?The size of the tumor. ?If the cancer has spread. ?Where the cancer has spread. ?How is this treated? ?Depending on the type and stage of your pancreatic cancer, treatment may include: ?Surgery to remove all or part of the pancreas, or to remove the tumor. ?Chemotherapy. This uses medicine to destroy the cancer cells. ?Radiation therapy. This uses high-energy beams to kill cancer cells. ?Medicine to attack a tumor's genes and proteins (targeted therapy). These medicines attack the genes and proteins that allow a tumor to grow while limiting damage to healthy cells. ?Participating in clinical trials to see if new (experimental) treatments are effective. ?Medicines to help manage pain and other symptoms. ?Your health care provider may recommend a combination of surgery, radiation therapy, and chemotherapy. You may be referred to a health care provider who specializes in cancer (oncologist). ?Follow these instructions at home: ?Medicines ?Take over-the-counter and prescription medicines only as told by your health care provider. ?Ask your health care provider about changing or stopping your regular medicines. This is especially important if you are taking diabetes medicines or blood  thinners. ?Do not take dietary supplements or herbal medicines unless your health care provider tells you to take  them. Some supplements can interfere with how well the treatment works. ?Ask your health care provider if the medicine prescribed to you: ?Requires you to avoid driving or using heavy machinery. ?Can cause constipation. You may need to take these actions to prevent or treat constipation: ?Drink enough fluid to keep your urine pale yellow. ?Take over-the-counter or prescription medicines. ?Eat foods that are high in fiber, such as beans, whole grains, and fresh fruits and vegetables. ?Limit foods that are high in fat and processed sugars, such as fried or sweet foods. ?Lifestyle ?Get enough sleep on a regular basis. Most adults need 6-8 hours of sleep each night. During treatment, you may need more sleep. ?Rest as told by your health care provider. ?Consider joining a cancer support group. Ask your health care provider for more information about local and online support groups. This may help you learn to cope with the stress of having pancreatic cancer. ?Do not use any products that contain nicotine or tobacco, such as cigarettes, e-cigarettes, and chewing tobacco. If you need help quitting, ask your health care provider. ?Eating and drinking ?Try to eat regular, healthy meals. Some of your treatments might affect your appetite. If you are having problems eating or with your appetite, ask to meet with a food and nutrition specialist (dietitian). ?Do not drink alcohol. ?General instructions ?Return to your normal activities as told by your health care provider. Ask your health care provider what activities are safe for you. ?Work with your health care provider to manage any side effects of your treatment. ?Keep all follow-up visits as told by your health care provider. This is important. ?Where to find more information ?American Cancer Society: https://www.cancer.org ?[place]): https://www.cancer.gov ?Contact a health care provider if: ?You have unexplained weight loss. ?Get help right away  if: ?Your pain suddenly gets worse. ?Your skin or eyes turn more yellow. ?You cannot eat or drink without vomiting. ?You have: ?Trouble breathing. ?Chest pain or an irregular heartbeat. ?Blood in your vomit or dark, tarry stools. ?New fatigue or weakness. ?Abdominal bloating or pain. ?These symptoms may represent a serious problem that is an emergency. Do not wait to see if the symptoms will go away. Get medical help right away. Call your local emergency services (911 in the [place] Do not drive yourself to the hospital. ?Summary ?Pancreatic cancer is a tumor in the pancreas that is cancerous (malignant). ?Risk factors include having a family history of cancer of the pancreas, colon, or ovaries. Risk factors also include having long-term inflammation of the pancreas (chronic pancreatitis) and diabetes. ?Treatment may include a combination of surgery, medicine to destroy cancer cells (chemotherapy), and high-energy beams to kill cancer cells (radiation therapy). ?Consider joining a cancer support group. This may help you learn to cope with the stress of having pancreatic cancer. ?Keep all follow-up visits as told by your health care provider. This is important. ?This information is not intended to replace advice given to you by your health care provider. Make sure you discuss any questions you have with your health care provider. ?Document Revised: 06/17/2021 Document Reviewed: 11/10/2018 ?Elsevier Patient Education ? [place]. ? ?

## 2021-12-31 NOTE — Progress Notes (Signed)
? ?Subjective:  ? ? Patient ID: Noah Velasquez, male    DOB: 08/26/1965, 56 y.o.   MRN: [id number] ? ?HPI ? ?Patient presents to clinic today for hospital follow-up.  He presented to the ER 5/7 with complaint of nausea, vomiting and abdominal pain.  He has a history of chronic pancreatitis with a 11 mm pseudocyst.  His lipase was elevated.  He was diagnosed with acute on chronic alcoholic pancreatitis.  He received IV fluids and pain medications.  He was placed on a clear liquid diet.  He received alcohol cessation counseling.  Of note, his BP was elevated and patient likely secondary to pain.  His antihypertensive medications were not adjusted.  He was discharged on 5/10 and advised to follow-up with his PCP.  Since discharge, he denies recurrence of abdominal pain, nausea or vomiting.  He has been abstaining from alcohol. ? ?Review of Systems ? ?Past Medical History:  ?Diagnosis Date  ? Chronic hip pain   ? Chronic pancreatitis ([place])   ? CKD (chronic kidney disease) stage 3, GFR 30-59 ml/min (HCC)   ? GERD (gastroesophageal reflux disease)   ? Hyperlipidemia   ? Hypertension   ? Osteoarthritis   ? Pancreatitis   ? Pancreatitis   ? PTSD (post-traumatic stress disorder)   ? Viral infection of left eye   ? ? ?Current Outpatient Medications  ?Medication Sig Dispense Refill  ? albuterol (VENTOLIN HFA) 108 (90 Base) MCG/ACT inhaler Inhale 1-2 puffs into the lungs every 6 (six) hours as needed for wheezing or shortness of breath. 18 g 0  ? amLODipine (NORVASC) 10 MG tablet Take 1 tablet (10 mg total) by mouth daily. 90 tablet 1  ? Blood Pressure KIT Please allow patient to chose from machines that are covered under insurance 1 kit 0  ? buPROPion (WELLBUTRIN XL) 150 MG 24 hr tablet Take 1 tablet (150 mg total) by mouth daily. 90 tablet 1  ? famotidine (PEPCID) 20 MG tablet TAKE 1 TABLET BY MOUTH TWICE DAILY 180 tablet 0  ? fluticasone (FLONASE) 50 MCG/ACT nasal spray Place 2 sprays into both nostrils daily. 16 g 6  ?  gabapentin (NEURONTIN) 100 MG capsule Take 1 capsule (100 mg total) by mouth at bedtime as needed (sleep). 90 capsule 1  ? naproxen (NAPROSYN) 500 MG tablet Take 1 tablet (500 mg total) by mouth 2 (two) times daily as needed for mild pain or moderate pain. 30 tablet 1  ? Olopatadine HCl 0.2 % SOLN APPLY 1 DROP TO EYE ONCE DAILY 2.5 mL 0  ? omeprazole (PRILOSEC) 20 MG capsule TAKE 1 CAPSULE BY MOUTH ONCE DAILY 90 capsule 1  ? Pancrelipase, Lip-Prot-Amyl, (ZENPEP) 15000-47000 units CPEP TAKE 1 CAPSULE BY MOUTH 3 (THREE) TIMES DAILY WITH MEALS. 270 capsule 1  ? sertraline (ZOLOFT) 100 MG tablet Take 1 tablet (100 mg total) by mouth daily. 30 tablet 0  ? traZODone (DESYREL) 150 MG tablet Take 1 tablet (150 mg total) by mouth at bedtime as needed for sleep. 30 tablet 5  ? ?No current facility-administered medications for this visit.  ? ? ?Allergies  ?Allergen Reactions  ? Hctz [Hydrochlorothiazide] Swelling  ? ? ?Family History  ?Problem Relation Age of Onset  ? Diabetes Mother   ? Sleep apnea Mother   ? Emphysema Sister   ? CAD Neg Hx   ? Mental illness Neg Hx   ? ? ?Social History  ? ?Socioeconomic History  ? Marital status: Legally Separated  ?  Spouse name:  Not on file  ? Number of children: 2  ? Years of education: Not on file  ? Highest education level: High school graduate  ?Occupational History  ? Occupation: on disability  ?Tobacco Use  ? Smoking status: Every Day  ?  Packs/day: 0.25  ?  Years: 15.00  ?  Pack years: 3.75  ?  Types: Cigarettes  ? Smokeless tobacco: Never  ?Vaping Use  ? Vaping Use: Never used  ?Substance and Sexual Activity  ? Alcohol use: Yes  ?  Alcohol/week: 7.0 standard drinks  ?  Types: 7 Cans of beer per week  ?  Comment: weekly  ? Drug use: Yes  ?  Types: Marijuana  ? Sexual activity: Yes  ?  Birth control/protection: Condom  ?Other Topics Concern  ? Not on file  ?Social History Narrative  ? Not on file  ? ?Social Determinants of Health  ? ?Financial Resource Strain: Not on file  ?Food  Insecurity: Not on file  ?Transportation Needs: Not on file  ?Physical Activity: Not on file  ?Stress: Not on file  ?Social Connections: Not on file  ?Intimate Partner Violence: Not on file  ? ? ? ?Constitutional: Denies fever, malaise, fatigue, headache or abrupt weight changes.  ?Respiratory: Denies difficulty breathing, shortness of breath, cough or sputum production.   ?Cardiovascular: Denies chest pain, chest tightness, palpitations or swelling in the hands or feet.  ?Gastrointestinal: Denies abdominal pain, bloating, constipation, diarrhea or blood in the stool.  ?GU: Denies urgency, frequency, pain with urination, burning sensation, blood in urine, odor or discharge. ? ?No other specific complaints in a complete review of systems (except as listed in HPI above). ? ?   ?Objective:  ? Physical Exam ? ?BP 128/84 (BP Location: Right Arm, Patient Position: Sitting, Cuff Size: Normal)   Pulse 75   Temp (!) 97.5 ?F (36.4 ?C) (Temporal)   Wt 170 lb (77.1 kg) Comment: Per pt.  SpO2 98%   BMI 26.63 kg/m?  ? ?Wt Readings from Last 3 Encounters:  ?12/10/21 169 lb (76.7 kg)  ?12/08/21 178 lb (80.7 kg)  ?09/23/21 180 lb (81.6 kg)  ? ? ?General: Appears his stated age, chronically ill-appearing, in NAD. ?Cardiovascular: Normal rate and rhythm. S1,S2 noted.  No murmur, rubs or gallops noted. ?Pulmonary/Chest: Normal effort and positive vesicular breath sounds. No respiratory distress. No wheezes, rales or ronchi noted.  ?Abdomen: Soft and nontender. Normal bowel sounds. No distention or masses noted. Liver, spleen and kidneys non palpable. ?Musculoskeletal: In motorized wheelchair. ?Neurological: Alert and oriented.  ? ?BMET ?   ?Component Value Date/Time  ? NA 133 (L) 12/22/2021 0739  ? K 3.9 12/22/2021 0739  ? CL 101 12/22/2021 0739  ? CO2 24 12/22/2021 0739  ? GLUCOSE 132 (H) 12/22/2021 0739  ? BUN 8 12/22/2021 0739  ? CREATININE 0.92 12/22/2021 0739  ? CREATININE 1.20 12/10/2021 0956  ? CALCIUM 8.7 (L) 12/22/2021  0739  ? GFRNONAA >60 12/22/2021 0739  ? GFRNONAA 64 06/06/2020 1141  ? GFRAA 74 06/06/2020 1141  ? ? ?Lipid Panel  ?   ?Component Value Date/Time  ? CHOL 150 06/06/2020 1141  ? TRIG 59 06/06/2020 1141  ? HDL 48 06/06/2020 1141  ? CHOLHDL 3.1 06/06/2020 1141  ? VLDL 18 06/12/2018 1015  ? [place] 88 06/06/2020 1141  ? ? ?CBC ?   ?Component Value Date/Time  ? WBC 15.3 (H) 12/22/2021 0739  ? RBC 4.54 12/22/2021 0739  ? HGB 13.8 12/22/2021 0739  ?  HCT 41.8 12/22/2021 0739  ? PLT 372 12/22/2021 0739  ? MCV 92.1 12/22/2021 0739  ? MCH 30.4 12/22/2021 0739  ? MCHC 33.0 12/22/2021 0739  ? RDW 13.0 12/22/2021 0739  ? LYMPHSABS 2.8 12/21/2021 2111  ? MONOABS 0.6 12/21/2021 2111  ? EOSABS 0.1 12/21/2021 2111  ? BASOSABS 0.1 12/21/2021 2111  ? ? ?Hgb A1C ?Lab Results  ?Component Value Date  ? HGBA1C 5.2 01/10/2020  ? ? ? ? ? ? ?   ?Assessment & Plan:  ? ?Hospital follow-up for Acute on Chronic Alcoholic Pancreatitis, HTN: ? ?Hospital notes, labs reviewed ?Reinforced abstaining from alcohol ?Medications reviewed-no changes at this time ? ?Please schedule an appointment for your annual exam ?Webb Silversmith, NP ? ?

## 2022-01-01 DIAGNOSIS — F3110 Bipolar disorder, current episode manic without psychotic features, unspecified: Secondary | ICD-10-CM | POA: Diagnosis not present

## 2022-01-01 DIAGNOSIS — M069 Rheumatoid arthritis, unspecified: Secondary | ICD-10-CM | POA: Diagnosis not present

## 2022-01-02 DIAGNOSIS — M069 Rheumatoid arthritis, unspecified: Secondary | ICD-10-CM | POA: Diagnosis not present

## 2022-01-05 DIAGNOSIS — M069 Rheumatoid arthritis, unspecified: Secondary | ICD-10-CM | POA: Diagnosis not present

## 2022-01-06 DIAGNOSIS — M069 Rheumatoid arthritis, unspecified: Secondary | ICD-10-CM | POA: Diagnosis not present

## 2022-01-06 DIAGNOSIS — F3110 Bipolar disorder, current episode manic without psychotic features, unspecified: Secondary | ICD-10-CM | POA: Diagnosis not present

## 2022-01-07 DIAGNOSIS — M069 Rheumatoid arthritis, unspecified: Secondary | ICD-10-CM | POA: Diagnosis not present

## 2022-01-08 ENCOUNTER — Ambulatory Visit
Admission: RE | Admit: 2022-01-08 | Discharge: 2022-01-08 | Disposition: A | Discharge status: Home / Self Care | Payer: Medicaid Other | Source: Ambulatory Visit | Attending: Internal Medicine | Admitting: Internal Medicine

## 2022-01-08 DIAGNOSIS — K859 Acute pancreatitis without necrosis or infection, unspecified: Secondary | ICD-10-CM | POA: Diagnosis not present

## 2022-01-08 DIAGNOSIS — K861 Other chronic pancreatitis: Secondary | ICD-10-CM | POA: Diagnosis not present

## 2022-01-08 DIAGNOSIS — K8689 Other specified diseases of pancreas: Secondary | ICD-10-CM | POA: Diagnosis not present

## 2022-01-08 DIAGNOSIS — K862 Cyst of pancreas: Secondary | ICD-10-CM | POA: Diagnosis not present

## 2022-01-08 DIAGNOSIS — I700 Atherosclerosis of aorta: Secondary | ICD-10-CM | POA: Diagnosis not present

## 2022-01-08 DIAGNOSIS — M069 Rheumatoid arthritis, unspecified: Secondary | ICD-10-CM | POA: Diagnosis not present

## 2022-01-08 MED ORDER — GADOBUTROL 1 MMOL/ML IV SOLN
7.0000 mL | Freq: Once | INTRAVENOUS | Status: AC | PRN
  Administered 2022-01-08: 7 mL via INTRAVENOUS

## 2022-01-09 DIAGNOSIS — M069 Rheumatoid arthritis, unspecified: Secondary | ICD-10-CM | POA: Diagnosis not present

## 2022-01-12 DIAGNOSIS — M069 Rheumatoid arthritis, unspecified: Secondary | ICD-10-CM | POA: Diagnosis not present

## 2022-01-13 DIAGNOSIS — M069 Rheumatoid arthritis, unspecified: Secondary | ICD-10-CM | POA: Diagnosis not present

## 2022-01-14 ENCOUNTER — Other Ambulatory Visit: Payer: Self-pay | Admitting: Internal Medicine

## 2022-01-14 DIAGNOSIS — H0520 Unspecified exophthalmos: Secondary | ICD-10-CM

## 2022-01-14 DIAGNOSIS — M069 Rheumatoid arthritis, unspecified: Secondary | ICD-10-CM | POA: Diagnosis not present

## 2022-01-14 DIAGNOSIS — F3110 Bipolar disorder, current episode manic without psychotic features, unspecified: Secondary | ICD-10-CM | POA: Diagnosis not present

## 2022-01-15 DIAGNOSIS — M069 Rheumatoid arthritis, unspecified: Secondary | ICD-10-CM | POA: Diagnosis not present

## 2022-01-15 NOTE — Telephone Encounter (Signed)
Requested Prescriptions  Pending Prescriptions Disp Refills  . 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)   [facility] [place], Coralie Keens, NP   1 month ago LUQ pain   [place], NP   3 months ago Shortness of breath   [facility] [place], Coralie Keens, NP   7 months ago [facility] [place], Coralie Keens, NP   11 months ago Posttraumatic stress disorder   [facility] [place], Coralie Keens, NP

## 2022-01-16 DIAGNOSIS — M069 Rheumatoid arthritis, unspecified: Secondary | ICD-10-CM | POA: Diagnosis not present

## 2022-01-18 ENCOUNTER — Other Ambulatory Visit: Payer: Self-pay | Admitting: Internal Medicine

## 2022-01-19 DIAGNOSIS — M069 Rheumatoid arthritis, unspecified: Secondary | ICD-10-CM | POA: Diagnosis not present

## 2022-01-19 NOTE — Telephone Encounter (Signed)
Requested Prescriptions  Pending Prescriptions Disp Refills  . sertraline (ZOLOFT) 100 MG tablet [Pharmacy Med Name: SERTRALINE HCL 100 MG TAB] 30 tablet 0    Sig: TAKE 1 TABLET BY MOUTH ONCE DAILY *PLEASE MAKE APPOINTMENT FOR FURTHER FILLS*     Psychiatry:  Antidepressants - SSRI - sertraline Passed - 01/18/2022  2:05 PM      Passed - AST in normal range and within 360 days    AST  Date Value Ref Range Status  12/22/2021 21 15 - 41 U/L Final         Passed - ALT in normal range and within 360 days    ALT  Date Value Ref Range Status  12/22/2021 12 0 - 44 U/L Final         Passed - Completed PHQ-2 or PHQ-9 in the last 360 days      Passed - Valid encounter within last 6 months    Recent Outpatient Visits          2 weeks ago 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[facility])   [facility], Coralie Keens, NP   1 month ago LUQ pain   [place], NP   3 months ago Shortness of breath   [facility] [place], Coralie Keens, NP   7 months ago [facility] [place], Coralie Keens, NP   12 months ago Posttraumatic stress disorder   [facility] [place], Coralie Keens, NP      Future Appointments            In 1 week Garnette Gunner, Coralie Keens, NP [facility], [facility]

## 2022-01-20 DIAGNOSIS — M069 Rheumatoid arthritis, unspecified: Secondary | ICD-10-CM | POA: Diagnosis not present

## 2022-01-21 DIAGNOSIS — M069 Rheumatoid arthritis, unspecified: Secondary | ICD-10-CM | POA: Diagnosis not present

## 2022-01-22 DIAGNOSIS — M069 Rheumatoid arthritis, unspecified: Secondary | ICD-10-CM | POA: Diagnosis not present

## 2022-01-23 DIAGNOSIS — M069 Rheumatoid arthritis, unspecified: Secondary | ICD-10-CM | POA: Diagnosis not present

## 2022-01-26 ENCOUNTER — Other Ambulatory Visit: Payer: Self-pay | Admitting: Internal Medicine

## 2022-01-26 DIAGNOSIS — M069 Rheumatoid arthritis, unspecified: Secondary | ICD-10-CM | POA: Diagnosis not present

## 2022-01-26 DIAGNOSIS — K860 Alcohol-induced chronic pancreatitis: Secondary | ICD-10-CM

## 2022-01-26 NOTE — Telephone Encounter (Signed)
Pt called to add naproxen (NAPROSYN) 500 MG tablet to his request, he says he is completely out of both. Please advise

## 2022-01-27 DIAGNOSIS — M069 Rheumatoid arthritis, unspecified: Secondary | ICD-10-CM | POA: Diagnosis not present

## 2022-01-27 NOTE — Telephone Encounter (Signed)
Requested Prescriptions  Pending Prescriptions Disp Refills  . Pancrelipase, Lip-Prot-Amyl, (ZENPEP) 68341-96222 units CPEP [Pharmacy Med Name: ZENPEP [id number] UNIT CAP] 270 capsule 1    Sig: TAKE 1 CAPSULE BY MOUTH 3 TIMES DAILY WITH MEALS     Off-Protocol Failed - 01/26/2022  1:57 PM      Failed - Medication not assigned to a protocol, review manually.      Passed - Valid encounter within last 12 months    Recent Outpatient Visits          3 weeks ago 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[facility])   [facility] [place], Coralie Keens, NP   1 month ago LUQ pain   [facility] [place], Coralie Keens, NP   4 months ago Shortness of breath   [facility] [place], Coralie Keens, NP   7 months ago [facility] [place], Coralie Keens, NP   1 year ago Posttraumatic stress disorder   [facility] [place], Coralie Keens, NP      Future Appointments            Tomorrow Garnette Gunner, [facility] Keens, [facility]           . naproxen (NAPROSYN) 500 MG tablet [Pharmacy Med Name: NAPROXEN 500 MG TAB] 30 tablet 1    Sig: TAKE 1 TABLET BY MOUTH TWICE DAILY AS NEEDED FOR MILD OR MODERATE PAIN     Analgesics:  NSAIDS Failed - 01/26/2022  1:57 PM      Failed - Manual Review: Labs are only required if the patient has taken medication for more than 8 weeks.      Passed - Cr in normal range and within 360 days    Creat  Date Value Ref Range Status  12/10/2021 1.20 0.70 - 1.30 mg/dL Final   Creatinine, Ser  Date Value Ref Range Status  12/22/2021 0.92 0.61 - 1.24 mg/dL Final         Passed - HGB in normal range and within 360 days    Hemoglobin  Date Value Ref Range Status  12/22/2021 13.8 13.0 - 17.0 g/dL Final         Passed - PLT in normal range and within 360 days    Platelets  Date Value Ref Range Status  12/22/2021 372 150 - 400 K/uL Final         Passed - HCT in normal range and within 360 days    HCT  Date  Value Ref Range Status  12/22/2021 41.8 39.0 - 52.0 % Final         Passed - eGFR is 30 or above and within 360 days    GFR, Est African American  Date Value Ref Range Status  06/06/2020 74 > OR = 60 mL/min/1.16m Final   GFR, Est Non African American  Date Value Ref Range Status  06/06/2020 64 > OR = 60 mL/min/1.740mFinal   GFR, Estimated  Date Value Ref Range Status  12/22/2021 >60 >60 mL/min Final    Comment:    (NOTE) Calculated using the CKD-EPI Creatinine Equation (2021)          Passed - Patient is not pregnant      Passed - Valid encounter within last 12 months    Recent Outpatient Visits          3 weeks ago 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([facility]  [facility] [place]  [place], NP   1 month ago LUQ pain   [place], NP   4 months ago Shortness of breath   [facility] [place], Coralie Keens, NP   7 months ago [facility] [place], Coralie Keens, NP   1 year ago Posttraumatic stress disorder   [facility], Coralie Keens, NP      Future Appointments            Tomorrow Garnette Gunner, Coralie Keens, NP [facility], [facility]

## 2022-01-27 NOTE — Telephone Encounter (Signed)
Requested medication (s) are due for refill today - expired Rx  Requested medication (s) are on the active medication list -yes  Future visit scheduled -yes  Last refill: 01/23/21 #270 1RF  Notes to clinic: Expired Rx, medication not assigned protocol   Requested Prescriptions  Pending Prescriptions Disp Refills   Pancrelipase, Lip-Prot-Amyl, (ZENPEP) 15000-47000 units CPEP [Pharmacy Med Name: ZENPEP [id number] UNIT CAP] 270 capsule 1    Sig: TAKE 1 CAPSULE BY MOUTH 3 TIMES DAILY WITH MEALS     Off-Protocol Failed - 01/26/2022  1:57 PM      Failed - Medication not assigned to a protocol, review manually.      Passed - Valid encounter within last 12 months    Recent Outpatient Visits           3 weeks ago 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[facility])   [facility] [place], Coralie Keens, NP   1 month ago LUQ pain   [facility] [place], Coralie Keens, NP   4 months ago Shortness of breath   [facility] [place], Coralie Keens, NP   7 months ago [facility] [place], Coralie Keens, NP   1 year ago Posttraumatic stress disorder   [facility] [place], Coralie Keens, NP       Future Appointments             Tomorrow Garnette Gunner, Coralie Keens, NP [facility], [facility]            Signed Prescriptions Disp Refills   naproxen (NAPROSYN) 500 MG tablet 30 tablet 1    Sig: TAKE 1 TABLET BY MOUTH TWICE DAILY AS NEEDED FOR MILD OR MODERATE PAIN     Analgesics:  NSAIDS Failed - 01/26/2022  1:57 PM      Failed - Manual Review: Labs are only required if the patient has taken medication for more than 8 weeks.      Passed - Cr in normal range and within 360 days    Creat  Date Value Ref Range Status  12/10/2021 1.20 0.70 - 1.30 mg/dL Final   Creatinine, Ser  Date Value Ref Range Status  12/22/2021 0.92 0.61 - 1.24 mg/dL Final         Passed - HGB in normal range and within 360 days    Hemoglobin  Date Value Ref Range  Status  12/22/2021 13.8 13.0 - 17.0 g/dL Final         Passed - PLT in normal range and within 360 days    Platelets  Date Value Ref Range Status  12/22/2021 372 150 - 400 K/uL Final         Passed - HCT in normal range and within 360 days    HCT  Date Value Ref Range Status  12/22/2021 41.8 39.0 - 52.0 % Final         Passed - eGFR is 30 or above and within 360 days    GFR, Est African American  Date Value Ref Range Status  06/06/2020 74 > OR = 60 mL/min/1.87m Final   GFR, Est Non African American  Date Value Ref Range Status  06/06/2020 64 > OR = 60 mL/min/1.776mFinal   GFR, Estimated  Date Value Ref Range Status  12/22/2021 >60 >60 mL/min Final    Comment:    (NOTE) Calculated using the CKD-EPI Creatinine Equation (2021)          Passed -  Patient is not pregnant      Passed - Valid encounter within last 12 months    Recent Outpatient Visits           3 weeks ago 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[facility])   [facility] [place], Coralie Keens, NP   1 month ago LUQ pain   [facility] [place], Coralie Keens, NP   4 months ago Shortness of breath   [facility] [place], Coralie Keens, NP   7 months ago [facility] [place], Coralie Keens, NP   1 year ago Posttraumatic stress disorder   [facility] [place], Coralie Keens, NP       Future Appointments             Tomorrow Garnette Gunner, Coralie Keens, NP [facility] [place] Prescriptions  Pending Prescriptions Disp Refills   Pancrelipase, Lip-Prot-Amyl, (ZENPEP) 15000-47000 units CPEP [Pharmacy Med Name: ZENPEP [id number] UNIT CAP] 270 capsule 1    Sig: TAKE 1 CAPSULE BY MOUTH 3 TIMES DAILY WITH MEALS     Off-Protocol Failed - 01/26/2022  1:57 PM      Failed - Medication not assigned to a protocol, review manually.      Passed - Valid encounter within last 12 months    Recent Outpatient Visits           3  weeks ago 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[facility])   [facility] [place], Coralie Keens, NP   1 month ago LUQ pain   [facility] [place], Coralie Keens, NP   4 months ago Shortness of breath   [facility] [place], Coralie Keens, NP   7 months ago [facility] [place], Coralie Keens, NP   1 year ago Posttraumatic stress disorder   [facility] [place], Coralie Keens, NP       Future Appointments             Tomorrow Garnette Gunner, Coralie Keens, NP [facility], [facility]            Signed Prescriptions Disp Refills   naproxen (NAPROSYN) 500 MG tablet 30 tablet 1    Sig: TAKE 1 TABLET BY MOUTH TWICE DAILY AS NEEDED FOR MILD OR MODERATE PAIN     Analgesics:  NSAIDS Failed - 01/26/2022  1:57 PM      Failed - Manual Review: Labs are only required if the patient has taken medication for more than 8 weeks.      Passed - Cr in normal range and within 360 days    Creat  Date Value Ref Range Status  12/10/2021 1.20 0.70 - 1.30 mg/dL Final   Creatinine, Ser  Date Value Ref Range Status  12/22/2021 0.92 0.61 - 1.24 mg/dL Final         Passed - HGB in normal range and within 360 days    Hemoglobin  Date Value Ref Range Status  12/22/2021 13.8 13.0 - 17.0 g/dL Final         Passed - PLT in normal range and within 360 days    Platelets  Date Value Ref Range Status  12/22/2021 372 150 - 400 K/uL Final         Passed - HCT in normal range and within 360 days    HCT  Date Value Ref Range Status  12/22/2021 41.8 39.0 - 52.0 % Final         Passed - eGFR is 30 or above and within 360 days    GFR, Est African American  Date Value Ref Range Status  06/06/2020 74 > OR = 60 mL/min/1.81m Final   GFR, Est Non African American  Date Value Ref Range Status  06/06/2020 64 > OR = 60 mL/min/1.71mFinal   GFR, Estimated  Date Value Ref Range Status  12/22/2021 >60 >60 mL/min Final    Comment:    (NOTE) Calculated using  the CKD-EPI Creatinine Equation (2021)          Passed - Patient is not pregnant      Passed - Valid encounter within last 12 months    Recent Outpatient Visits           3 weeks ago 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([facility]  [facility] [place]   1 month ago LUQ pain   [place]   4 months ago Shortness of breath   [facility]   7 months ago [facility]   1 year ago Posttraumatic stress disorder   [facility]       Future Appointments             Tomorrow BaGarnette GunnerReCoralie KeensNP [facility]

## 2022-01-27 NOTE — Telephone Encounter (Signed)
Requested medications are due for refill today.  yes  Requested medications are on the active medications list.  yes  Last refill. 01/23/2021 #27- 1 refill  Future visit scheduled.   yes  Notes to clinic.  Medication not assigned to a protocol - please review for refill.    Requested Prescriptions  Pending Prescriptions Disp Refills   Pancrelipase, Lip-Prot-Amyl, (ZENPEP) 12248-25003 units CPEP [Pharmacy Med Name: ZENPEP [id number] UNIT CAP] 270 capsule 1    Sig: TAKE 1 CAPSULE BY MOUTH 3 TIMES DAILY WITH MEALS     Off-Protocol Failed - 01/26/2022  1:57 PM      Failed - Medication not assigned to a protocol, review manually.      Passed - Valid encounter within last 12 months    Recent Outpatient Visits           3 weeks ago 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[facility])   [facility] [place], Coralie Keens, NP   1 month ago LUQ pain   [facility] [place], Coralie Keens, NP   4 months ago Shortness of breath   [facility] [place], Coralie Keens, NP   7 months ago [facility] [place], Coralie Keens, NP   1 year ago Posttraumatic stress disorder   [facility] [place], Coralie Keens, NP       Future Appointments             Tomorrow Garnette Gunner, Coralie Keens, NP [facility], [facility]            Signed Prescriptions Disp Refills   naproxen (NAPROSYN) 500 MG tablet 30 tablet 1    Sig: TAKE 1 TABLET BY MOUTH TWICE DAILY AS NEEDED FOR MILD OR MODERATE PAIN     Analgesics:  NSAIDS Failed - 01/26/2022  1:57 PM      Failed - Manual Review: Labs are only required if the patient has taken medication for more than 8 weeks.      Passed - Cr in normal range and within 360 days    Creat  Date Value Ref Range Status  12/10/2021 1.20 0.70 - 1.30 mg/dL Final   Creatinine, Ser  Date Value Ref Range Status  12/22/2021 0.92 0.61 - 1.24 mg/dL Final         Passed - HGB in normal range and within 360 days    Hemoglobin   Date Value Ref Range Status  12/22/2021 13.8 13.0 - 17.0 g/dL Final         Passed - PLT in normal range and within 360 days    Platelets  Date Value Ref Range Status  12/22/2021 372 150 - 400 K/uL Final         Passed - HCT in normal range and within 360 days    HCT  Date Value Ref Range Status  12/22/2021 41.8 39.0 - 52.0 % Final         Passed - eGFR is 30 or above and within 360 days    GFR, Est African American  Date Value Ref Range Status  06/06/2020 74 > OR = 60 mL/min/1.21m Final   GFR, Est Non African American  Date Value Ref Range Status  06/06/2020 64 > OR = 60 mL/min/1.761mFinal   GFR, Estimated  Date Value Ref Range Status  12/22/2021 >60 >60 mL/min Final    Comment:    (NOTE) Calculated using the CKD-EPI Creatinine Equation (2021)  Passed - Patient is not pregnant      Passed - Valid encounter within last 12 months    Recent Outpatient Visits           3 weeks ago 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[facility])   [facility] [place], Coralie Keens, NP   1 month ago LUQ pain   [place], NP   4 months ago Shortness of breath   [facility] [place], Coralie Keens, NP   7 months ago [facility] [place], Coralie Keens, NP   1 year ago Posttraumatic stress disorder   [facility] [place], Coralie Keens, NP       Future Appointments             Tomorrow Garnette Gunner, Coralie Keens, NP [facility], [facility]

## 2022-01-28 ENCOUNTER — Ambulatory Visit: Payer: Medicaid Other | Admitting: Internal Medicine

## 2022-01-28 DIAGNOSIS — M069 Rheumatoid arthritis, unspecified: Secondary | ICD-10-CM | POA: Diagnosis not present

## 2022-01-28 NOTE — Progress Notes (Deleted)
Subjective:    Patient ID: Noah Velasquez, male    DOB: June 03, 1966, 56 y.o.   MRN: [id number]  HPI  Patient presents to clinic today for his annual exam.  Flu: 05/2021 Tetanus: 07/2018 Pneumovax: 03/2018 COVID:  Shingrix: 02/2019, 12/2021 PSA screening: 05/2020 Colon screening: Vision screening: Dentist:  Diet: Exercise:  Review of Systems     Past Medical History:  Diagnosis Date   Chronic hip pain    Chronic pancreatitis ([place])    CKD (chronic kidney disease) stage 3, GFR 30-59 ml/min (HCC)    GERD (gastroesophageal reflux disease)    Hyperlipidemia    Hypertension    Osteoarthritis    Pancreatitis    Pancreatitis    PTSD (post-traumatic stress disorder)    Viral infection of left eye     Current Outpatient Medications  Medication Sig Dispense Refill   amLODipine (NORVASC) 10 MG tablet TAKE 1 TABLET BY MOUTH ONCE DAILY 90 tablet 0   Blood Pressure KIT Please allow patient to chose from machines that are covered under insurance 1 kit 0   buPROPion (WELLBUTRIN XL) 150 MG 24 hr tablet Take 1 tablet (150 mg total) by mouth daily. 90 tablet 1   famotidine (PEPCID) 20 MG tablet TAKE 1 TABLET BY MOUTH TWICE DAILY 180 tablet 0   fluticasone (FLONASE) 50 MCG/ACT nasal spray Place 2 sprays into both nostrils daily. 16 g 6   gabapentin (NEURONTIN) 100 MG capsule Take 1 capsule (100 mg total) by mouth at bedtime as needed (sleep). 90 capsule 1   naproxen (NAPROSYN) 500 MG tablet TAKE 1 TABLET BY MOUTH TWICE DAILY AS NEEDED FOR MILD OR MODERATE PAIN 30 tablet 1   Olopatadine HCl 0.2 % SOLN APPLY 1 DROP TO EYE ONCE DAILY 2.5 mL 0   omeprazole (PRILOSEC) 20 MG capsule TAKE 1 CAPSULE BY MOUTH ONCE DAILY 90 capsule 1   Pancrelipase, Lip-Prot-Amyl, (ZENPEP) 15000-47000 units CPEP TAKE 1 CAPSULE BY MOUTH 3 TIMES DAILY WITH MEALS 270 capsule 1   sertraline (ZOLOFT) 100 MG tablet TAKE 1 TABLET BY MOUTH ONCE DAILY *PLEASE MAKE APPOINTMENT FOR FURTHER FILLS* 90 tablet 0   traZODone  (DESYREL) 150 MG tablet Take 1 tablet (150 mg total) by mouth at bedtime as needed for sleep. 30 tablet 5   VENTOLIN HFA 108 (90 Base) MCG/ACT inhaler INHALE 1-2 PUFFS INTO THE LLUNGS EVERY 6HOURS AS NEEDED FOR WHEEZING OR SHORTNESS OF BREATH 18 g 0   No current facility-administered medications for this visit.    Allergies  Allergen Reactions   Hctz [Hydrochlorothiazide] Swelling    Family History  Problem Relation Age of Onset   Diabetes Mother    Sleep apnea Mother    Emphysema Sister    CAD Neg Hx    Mental illness Neg Hx     Social History   Socioeconomic History   Marital status: Legally Separated    Spouse name: Not on file   Number of children: 2   Years of education: Not on file   Highest education level: High school graduate  Occupational History   Occupation: on disability  Tobacco Use   Smoking status: Every Day    Packs/day: 0.25    Years: 15.00    Total pack years: 3.75    Types: Cigarettes   Smokeless tobacco: Never  Vaping Use   Vaping Use: Never used  Substance and Sexual Activity   Alcohol use: Yes    Alcohol/week: 7.0 standard drinks of alcohol  Types: 7 Cans of beer per week    Comment: weekly   Drug use: Yes    Types: Marijuana   Sexual activity: Yes    Birth control/protection: Condom  Other Topics Concern   Not on file  Social History Narrative   Not on file   Social Determinants of Health   Financial Resource Strain: Not on file  Food Insecurity: Not on file  Transportation Needs: Not on file  Physical Activity: Not on file  Stress: Not on file  Social Connections: Not on file  Intimate Partner Violence: Not on file     Constitutional: Denies fever, malaise, fatigue, headache or abrupt weight changes.  HEENT: Denies eye pain, eye redness, ear pain, ringing in the ears, wax buildup, runny nose, nasal congestion, bloody nose, or sore throat. Respiratory: Patient reports intermittent shortness of breath.  Denies difficulty  breathing, cough or sputum production.   Cardiovascular: Denies chest pain, chest tightness, palpitations or swelling in the hands or feet.  Gastrointestinal: Denies abdominal pain, bloating, constipation, diarrhea or blood in the stool.  GU: Patient reports erectile dysfunction.  Denies urgency, frequency, pain with urination, burning sensation, blood in urine, odor or discharge. Musculoskeletal: Patient reports difficulty with gait, joint pain.  Denies decrease in range of motion, muscle pain or joint swelling.  Skin: Denies redness, rashes, lesions or ulcercations.  Neurological: Patient reports insomnia, peripheral neuropathy, difficulty with balance.  Denies dizziness, difficulty with memory, difficulty with speech or problems with balance and coordination.  Psych: Patient has a history of depression.  Denies anxiety, SI/HI.  No other specific complaints in a complete review of systems (except as listed in HPI above).  Objective:   Physical Exam   There were no vitals taken for this visit. Wt Readings from Last 3 Encounters:  12/31/21 170 lb (77.1 kg)  12/10/21 169 lb (76.7 kg)  12/08/21 178 lb (80.7 kg)    General: Appears his stated age, chronically ill-appearing, in NAD. Skin: Warm, dry and intact. No rashes, lesions or ulcerations noted. HEENT: Head: normal shape and size; Eyes: sclera white, no icterus, conjunctiva pink, PERRLA and EOMs intact; Ears: Tm's gray and intact, normal light reflex; Nose: mucosa pink and moist, septum midline; Throat/Mouth: Teeth present, mucosa pink and moist, no exudate, lesions or ulcerations noted.  Neck:  Neck supple, trachea midline. No masses, lumps or thyromegaly present.  Cardiovascular: Normal rate and rhythm. S1,S2 noted.  No murmur, rubs or gallops noted. No JVD or BLE edema. No carotid bruits noted. Pulmonary/Chest: Normal effort and positive vesicular breath sounds. No respiratory distress. No wheezes, rales or ronchi noted.  Abdomen:  Soft and nontender. Normal bowel sounds. No distention or masses noted. Liver, spleen and kidneys non palpable. Musculoskeletal: Normal range of motion. No signs of joint swelling. No difficulty with gait.  Neurological: Alert and oriented. Cranial nerves II-XII grossly intact. Coordination normal.  Psychiatric: Mood and affect normal. Behavior is normal. Judgment and thought content normal.    BMET    Component Value Date/Time   NA 133 (L) 12/22/2021 0739   K 3.9 12/22/2021 0739   CL 101 12/22/2021 0739   CO2 24 12/22/2021 0739   GLUCOSE 132 (H) 12/22/2021 0739   BUN 8 12/22/2021 0739   CREATININE 0.92 12/22/2021 0739   CREATININE 1.20 12/10/2021 0956   CALCIUM 8.7 (L) 12/22/2021 0739   GFRNONAA >60 12/22/2021 0739   GFRNONAA 64 06/06/2020 1141   GFRAA 74 06/06/2020 1141    Lipid Panel  Component Value Date/Time   CHOL 150 06/06/2020 1141   TRIG 59 06/06/2020 1141   HDL 48 06/06/2020 1141   CHOLHDL 3.1 06/06/2020 1141   VLDL 18 06/12/2018 1015   LDLCALC 88 06/06/2020 1141    CBC    Component Value Date/Time   WBC 15.3 (H) 12/22/2021 0739   RBC 4.54 12/22/2021 0739   HGB 13.8 12/22/2021 0739   HCT 41.8 12/22/2021 0739   PLT 372 12/22/2021 0739   MCV 92.1 12/22/2021 0739   MCH 30.4 12/22/2021 0739   MCHC 33.0 12/22/2021 0739   RDW 13.0 12/22/2021 0739   LYMPHSABS 2.8 12/21/2021 2111   MONOABS 0.6 12/21/2021 2111   EOSABS 0.1 12/21/2021 2111   BASOSABS 0.1 12/21/2021 2111    Hgb A1C Lab Results  Component Value Date   HGBA1C 5.2 01/10/2020           Assessment & Plan:   Preventative Health Maintenance:  Encouraged him to get a flu shot in the fall Tetanus UTD Pneumovax UTD Encouraged him to get a COVID-vaccine Shingrix UTD Referral to GI for screening colonoscopy Encouraged him to consume a balanced diet and exercise regimen Advised him to see an eye doctor and dentist annually We will check CBC, c-Met, lipid, A1c and hep C today  RTC in 6  months, follow-up chronic conditions Webb Silversmith, NP

## 2022-01-29 DIAGNOSIS — M069 Rheumatoid arthritis, unspecified: Secondary | ICD-10-CM | POA: Diagnosis not present

## 2022-01-30 DIAGNOSIS — M069 Rheumatoid arthritis, unspecified: Secondary | ICD-10-CM | POA: Diagnosis not present

## 2022-02-02 DIAGNOSIS — M069 Rheumatoid arthritis, unspecified: Secondary | ICD-10-CM | POA: Diagnosis not present

## 2022-02-03 DIAGNOSIS — M069 Rheumatoid arthritis, unspecified: Secondary | ICD-10-CM | POA: Diagnosis not present

## 2022-02-04 DIAGNOSIS — M069 Rheumatoid arthritis, unspecified: Secondary | ICD-10-CM | POA: Diagnosis not present

## 2022-02-05 DIAGNOSIS — M069 Rheumatoid arthritis, unspecified: Secondary | ICD-10-CM | POA: Diagnosis not present

## 2022-02-06 DIAGNOSIS — M069 Rheumatoid arthritis, unspecified: Secondary | ICD-10-CM | POA: Diagnosis not present

## 2022-02-09 DIAGNOSIS — M069 Rheumatoid arthritis, unspecified: Secondary | ICD-10-CM | POA: Diagnosis not present

## 2022-02-10 DIAGNOSIS — M069 Rheumatoid arthritis, unspecified: Secondary | ICD-10-CM | POA: Diagnosis not present

## 2022-02-11 DIAGNOSIS — M069 Rheumatoid arthritis, unspecified: Secondary | ICD-10-CM | POA: Diagnosis not present

## 2022-02-12 DIAGNOSIS — M069 Rheumatoid arthritis, unspecified: Secondary | ICD-10-CM | POA: Diagnosis not present

## 2022-02-13 DIAGNOSIS — M069 Rheumatoid arthritis, unspecified: Secondary | ICD-10-CM | POA: Diagnosis not present

## 2022-02-16 DIAGNOSIS — M069 Rheumatoid arthritis, unspecified: Secondary | ICD-10-CM | POA: Diagnosis not present

## 2022-02-17 DIAGNOSIS — M069 Rheumatoid arthritis, unspecified: Secondary | ICD-10-CM | POA: Diagnosis not present

## 2022-02-18 DIAGNOSIS — M069 Rheumatoid arthritis, unspecified: Secondary | ICD-10-CM | POA: Diagnosis not present

## 2022-02-19 DIAGNOSIS — M069 Rheumatoid arthritis, unspecified: Secondary | ICD-10-CM | POA: Diagnosis not present

## 2022-02-20 DIAGNOSIS — M069 Rheumatoid arthritis, unspecified: Secondary | ICD-10-CM | POA: Diagnosis not present

## 2022-02-23 DIAGNOSIS — M069 Rheumatoid arthritis, unspecified: Secondary | ICD-10-CM | POA: Diagnosis not present

## 2022-02-24 DIAGNOSIS — M069 Rheumatoid arthritis, unspecified: Secondary | ICD-10-CM | POA: Diagnosis not present

## 2022-02-25 DIAGNOSIS — M069 Rheumatoid arthritis, unspecified: Secondary | ICD-10-CM | POA: Diagnosis not present

## 2022-02-26 DIAGNOSIS — M069 Rheumatoid arthritis, unspecified: Secondary | ICD-10-CM | POA: Diagnosis not present

## 2022-02-27 DIAGNOSIS — M069 Rheumatoid arthritis, unspecified: Secondary | ICD-10-CM | POA: Diagnosis not present

## 2022-03-02 DIAGNOSIS — M069 Rheumatoid arthritis, unspecified: Secondary | ICD-10-CM | POA: Diagnosis not present

## 2022-03-03 DIAGNOSIS — M069 Rheumatoid arthritis, unspecified: Secondary | ICD-10-CM | POA: Diagnosis not present

## 2022-03-04 DIAGNOSIS — M069 Rheumatoid arthritis, unspecified: Secondary | ICD-10-CM | POA: Diagnosis not present

## 2022-03-05 DIAGNOSIS — M069 Rheumatoid arthritis, unspecified: Secondary | ICD-10-CM | POA: Diagnosis not present

## 2022-03-06 DIAGNOSIS — M069 Rheumatoid arthritis, unspecified: Secondary | ICD-10-CM | POA: Diagnosis not present

## 2022-03-09 DIAGNOSIS — M069 Rheumatoid arthritis, unspecified: Secondary | ICD-10-CM | POA: Diagnosis not present

## 2022-03-10 DIAGNOSIS — M069 Rheumatoid arthritis, unspecified: Secondary | ICD-10-CM | POA: Diagnosis not present

## 2022-03-11 DIAGNOSIS — M069 Rheumatoid arthritis, unspecified: Secondary | ICD-10-CM | POA: Diagnosis not present

## 2022-03-12 DIAGNOSIS — M069 Rheumatoid arthritis, unspecified: Secondary | ICD-10-CM | POA: Diagnosis not present

## 2022-03-13 DIAGNOSIS — M069 Rheumatoid arthritis, unspecified: Secondary | ICD-10-CM | POA: Diagnosis not present

## 2022-03-16 DIAGNOSIS — M069 Rheumatoid arthritis, unspecified: Secondary | ICD-10-CM | POA: Diagnosis not present

## 2022-03-17 DIAGNOSIS — M069 Rheumatoid arthritis, unspecified: Secondary | ICD-10-CM | POA: Diagnosis not present

## 2022-03-18 DIAGNOSIS — M069 Rheumatoid arthritis, unspecified: Secondary | ICD-10-CM | POA: Diagnosis not present

## 2022-03-19 DIAGNOSIS — M069 Rheumatoid arthritis, unspecified: Secondary | ICD-10-CM | POA: Diagnosis not present

## 2022-03-20 DIAGNOSIS — M069 Rheumatoid arthritis, unspecified: Secondary | ICD-10-CM | POA: Diagnosis not present

## 2022-03-23 DIAGNOSIS — M069 Rheumatoid arthritis, unspecified: Secondary | ICD-10-CM | POA: Diagnosis not present

## 2022-03-24 DIAGNOSIS — M069 Rheumatoid arthritis, unspecified: Secondary | ICD-10-CM | POA: Diagnosis not present

## 2022-03-25 DIAGNOSIS — M069 Rheumatoid arthritis, unspecified: Secondary | ICD-10-CM | POA: Diagnosis not present

## 2022-03-26 DIAGNOSIS — M069 Rheumatoid arthritis, unspecified: Secondary | ICD-10-CM | POA: Diagnosis not present

## 2022-03-27 DIAGNOSIS — M069 Rheumatoid arthritis, unspecified: Secondary | ICD-10-CM | POA: Diagnosis not present

## 2022-03-30 DIAGNOSIS — M069 Rheumatoid arthritis, unspecified: Secondary | ICD-10-CM | POA: Diagnosis not present

## 2022-03-31 ENCOUNTER — Other Ambulatory Visit: Payer: Self-pay | Admitting: *Deleted

## 2022-03-31 DIAGNOSIS — M069 Rheumatoid arthritis, unspecified: Secondary | ICD-10-CM | POA: Diagnosis not present

## 2022-03-31 NOTE — Patient Instructions (Signed)
Visit Information  Noah Velasquez  - as a part of your Medicaid benefit, you are eligible for care management and care coordination services at no cost or copay. I was unable to reach you by phone today but would be happy to help you with your health related needs. Please feel free to call me @ [phone].   A member of the Managed Medicaid care management team will reach out to you again over the next 14 days.   Lurena Joiner RN, BSN [place]  [employer] manager

## 2022-03-31 NOTE — Patient Outreach (Signed)
  Medicaid Managed Care   Unsuccessful Attempt Note   03/31/2022 Name: Noah Velasquez MRN: [id number] DOB: July 31, 1966  Referred by: Jearld Fenton, NP Reason for referral : High Risk Managed Medicaid (Unsuccessful RNCM initial telephone outreach)   An unsuccessful telephone outreach was attempted today. The patient was referred to the case management team for assistance with care management and care coordination.    Follow Up Plan: A HIPAA compliant phone message was left for the patient providing contact information and requesting a return call.    Lurena Joiner RN, BSN [place]  [employer] manager

## 2022-04-01 ENCOUNTER — Telehealth: Payer: Self-pay | Admitting: Internal Medicine

## 2022-04-01 DIAGNOSIS — M069 Rheumatoid arthritis, unspecified: Secondary | ICD-10-CM | POA: Diagnosis not present

## 2022-04-01 NOTE — Telephone Encounter (Signed)
..   Medicaid Managed Care   Unsuccessful Outreach Note  04/01/2022 Name: Noah Velasquez MRN: [id number] DOB: 08-19-1965  Referred by: Jearld Fenton, NP Reason for referral : High Risk Managed Medicaid (I called the patient today to get him rescheduled with the MM RNCM. I left my name and number on his VM.)   A second unsuccessful telephone outreach was attempted today. The patient was referred to the case management team for assistance with care management and care coordination.   Follow Up Plan: The care management team will reach out to the patient again over the next 7 days.   [place]

## 2022-04-02 DIAGNOSIS — M069 Rheumatoid arthritis, unspecified: Secondary | ICD-10-CM | POA: Diagnosis not present

## 2022-04-03 DIAGNOSIS — M069 Rheumatoid arthritis, unspecified: Secondary | ICD-10-CM | POA: Diagnosis not present

## 2022-04-06 ENCOUNTER — Telehealth: Payer: Self-pay | Admitting: Internal Medicine

## 2022-04-06 DIAGNOSIS — M069 Rheumatoid arthritis, unspecified: Secondary | ICD-10-CM | POA: Diagnosis not present

## 2022-04-06 NOTE — Telephone Encounter (Signed)
..   Medicaid Managed Care   Unsuccessful Outreach Note  04/06/2022 Name: Noah Velasquez MRN: [id number] DOB: February 24, 1966  Referred by: Jearld Fenton, NP Reason for referral : High Risk Managed Medicaid (I called the patient today to get him rescheduled with the MM RNCM. He did not answer. I left my name and number on his VM. This was the third attempt to reach the patient.)   Third unsuccessful telephone outreach was attempted today. The patient was referred to the case management team for assistance with care management and care coordination. The patient's primary care provider has been notified of our unsuccessful attempts to make or maintain contact with the patient. The care management team is pleased to engage with this patient at any time in the future should he/she be interested in assistance from the care management team.   Follow Up Plan: We have been unable to make contact with the patient for follow up. The care management team is available to follow up with the patient after provider conversation with the patient regarding recommendation for care management engagement and subsequent re-referral to the care management team.    [place], [place]

## 2022-04-07 DIAGNOSIS — M069 Rheumatoid arthritis, unspecified: Secondary | ICD-10-CM | POA: Diagnosis not present

## 2022-04-08 DIAGNOSIS — M069 Rheumatoid arthritis, unspecified: Secondary | ICD-10-CM | POA: Diagnosis not present

## 2022-04-09 DIAGNOSIS — M069 Rheumatoid arthritis, unspecified: Secondary | ICD-10-CM | POA: Diagnosis not present

## 2022-04-10 DIAGNOSIS — M069 Rheumatoid arthritis, unspecified: Secondary | ICD-10-CM | POA: Diagnosis not present

## 2022-04-13 ENCOUNTER — Other Ambulatory Visit: Payer: Self-pay | Admitting: Internal Medicine

## 2022-04-13 DIAGNOSIS — M069 Rheumatoid arthritis, unspecified: Secondary | ICD-10-CM | POA: Diagnosis not present

## 2022-04-13 DIAGNOSIS — H0520 Unspecified exophthalmos: Secondary | ICD-10-CM

## 2022-04-13 NOTE — Telephone Encounter (Signed)
Pt called in to request to have refills placed on his requested medications.   Pharmacy:  [facility] [place]. Phone:  [phone]  Fax:  [phone]       Please assist pt further.

## 2022-04-14 DIAGNOSIS — M069 Rheumatoid arthritis, unspecified: Secondary | ICD-10-CM | POA: Diagnosis not present

## 2022-04-14 NOTE — Telephone Encounter (Signed)
Requested medication (s) are due for refill today:   Yes for all  Requested medication (s) are on the active medication list:   Yes for all  Future visit scheduled:   No   Last ordered: All meds due tor refill.    Returned because no protocols attached and [place] been trying to contact him without success   Requested Prescriptions  Pending Prescriptions Disp Refills   amLODipine (NORVASC) 10 MG tablet [Pharmacy Med Name: AMLODIPINE BESYLATE 10 MG TAB] 90 tablet 0    Sig: TAKE 1 TABLET BY MOUTH ONCE DAILY     There is no refill protocol information for this order     sertraline (ZOLOFT) 100 MG tablet [Pharmacy Med Name: SERTRALINE HCL 100 MG TAB] 90 tablet 0    Sig: TAKE 1 TABLET BY MOUTH ONCE DAILY *PLEASE MAKE APPOINTMENT FOR FURTHER FILLS*     There is no refill protocol information for this order     omeprazole (PRILOSEC) 20 MG capsule [Pharmacy Med Name: OMEPRAZOLE DR 20 MG CAP] 90 capsule 1    Sig: TAKE 1 CAPSULE BY MOUTH ONCE DAILY     There is no refill protocol information for this order     famotidine (PEPCID) 20 MG tablet [Pharmacy Med Name: FAMOTIDINE 20 MG TAB] 180 tablet 0    Sig: TAKE 1 TABLET BY MOUTH TWICE DAILY     There is no refill protocol information for this order     buPROPion (WELLBUTRIN XL) 150 MG 24 hr tablet [Pharmacy Med Name: BUPROPION HCL ER (XL) 150 MG TAB] 90 tablet 1    Sig: TAKE 1 TABLET BY MOUTH ONCE DAILY     There is no refill protocol information for this order

## 2022-04-14 NOTE — Telephone Encounter (Signed)
Called pt to schedule a physical, voicemail left with call back number.

## 2022-04-15 DIAGNOSIS — M069 Rheumatoid arthritis, unspecified: Secondary | ICD-10-CM | POA: Diagnosis not present

## 2022-04-16 DIAGNOSIS — M069 Rheumatoid arthritis, unspecified: Secondary | ICD-10-CM | POA: Diagnosis not present

## 2022-04-17 DIAGNOSIS — M069 Rheumatoid arthritis, unspecified: Secondary | ICD-10-CM | POA: Diagnosis not present

## 2022-04-20 DIAGNOSIS — M069 Rheumatoid arthritis, unspecified: Secondary | ICD-10-CM | POA: Diagnosis not present

## 2022-04-21 DIAGNOSIS — M069 Rheumatoid arthritis, unspecified: Secondary | ICD-10-CM | POA: Diagnosis not present

## 2022-04-22 DIAGNOSIS — M069 Rheumatoid arthritis, unspecified: Secondary | ICD-10-CM | POA: Diagnosis not present

## 2022-04-23 DIAGNOSIS — M069 Rheumatoid arthritis, unspecified: Secondary | ICD-10-CM | POA: Diagnosis not present

## 2022-04-24 DIAGNOSIS — M069 Rheumatoid arthritis, unspecified: Secondary | ICD-10-CM | POA: Diagnosis not present

## 2022-04-27 DIAGNOSIS — M069 Rheumatoid arthritis, unspecified: Secondary | ICD-10-CM | POA: Diagnosis not present

## 2022-04-28 DIAGNOSIS — M069 Rheumatoid arthritis, unspecified: Secondary | ICD-10-CM | POA: Diagnosis not present

## 2022-04-29 DIAGNOSIS — M069 Rheumatoid arthritis, unspecified: Secondary | ICD-10-CM | POA: Diagnosis not present

## 2022-04-30 DIAGNOSIS — M069 Rheumatoid arthritis, unspecified: Secondary | ICD-10-CM | POA: Diagnosis not present

## 2022-05-01 DIAGNOSIS — M069 Rheumatoid arthritis, unspecified: Secondary | ICD-10-CM | POA: Diagnosis not present

## 2022-05-04 DIAGNOSIS — M069 Rheumatoid arthritis, unspecified: Secondary | ICD-10-CM | POA: Diagnosis not present

## 2022-05-05 DIAGNOSIS — M069 Rheumatoid arthritis, unspecified: Secondary | ICD-10-CM | POA: Diagnosis not present

## 2022-05-06 DIAGNOSIS — M069 Rheumatoid arthritis, unspecified: Secondary | ICD-10-CM | POA: Diagnosis not present

## 2022-05-07 DIAGNOSIS — M069 Rheumatoid arthritis, unspecified: Secondary | ICD-10-CM | POA: Diagnosis not present

## 2022-05-08 DIAGNOSIS — M069 Rheumatoid arthritis, unspecified: Secondary | ICD-10-CM | POA: Diagnosis not present

## 2022-05-11 DIAGNOSIS — M069 Rheumatoid arthritis, unspecified: Secondary | ICD-10-CM | POA: Diagnosis not present

## 2022-05-12 DIAGNOSIS — M069 Rheumatoid arthritis, unspecified: Secondary | ICD-10-CM | POA: Diagnosis not present

## 2022-05-13 DIAGNOSIS — M069 Rheumatoid arthritis, unspecified: Secondary | ICD-10-CM | POA: Diagnosis not present

## 2022-05-14 DIAGNOSIS — M069 Rheumatoid arthritis, unspecified: Secondary | ICD-10-CM | POA: Diagnosis not present

## 2022-05-15 DIAGNOSIS — M069 Rheumatoid arthritis, unspecified: Secondary | ICD-10-CM | POA: Diagnosis not present

## 2022-05-18 DIAGNOSIS — M069 Rheumatoid arthritis, unspecified: Secondary | ICD-10-CM | POA: Diagnosis not present

## 2022-05-19 DIAGNOSIS — M069 Rheumatoid arthritis, unspecified: Secondary | ICD-10-CM | POA: Diagnosis not present

## 2022-05-20 DIAGNOSIS — M069 Rheumatoid arthritis, unspecified: Secondary | ICD-10-CM | POA: Diagnosis not present

## 2022-05-21 DIAGNOSIS — M069 Rheumatoid arthritis, unspecified: Secondary | ICD-10-CM | POA: Diagnosis not present

## 2022-05-22 DIAGNOSIS — M069 Rheumatoid arthritis, unspecified: Secondary | ICD-10-CM | POA: Diagnosis not present

## 2022-05-25 DIAGNOSIS — M069 Rheumatoid arthritis, unspecified: Secondary | ICD-10-CM | POA: Diagnosis not present

## 2022-05-26 DIAGNOSIS — M069 Rheumatoid arthritis, unspecified: Secondary | ICD-10-CM | POA: Diagnosis not present

## 2022-05-27 DIAGNOSIS — M069 Rheumatoid arthritis, unspecified: Secondary | ICD-10-CM | POA: Diagnosis not present

## 2022-05-28 DIAGNOSIS — M069 Rheumatoid arthritis, unspecified: Secondary | ICD-10-CM | POA: Diagnosis not present

## 2022-05-29 DIAGNOSIS — M069 Rheumatoid arthritis, unspecified: Secondary | ICD-10-CM | POA: Diagnosis not present

## 2022-05-31 DIAGNOSIS — G629 Polyneuropathy, unspecified: Secondary | ICD-10-CM | POA: Diagnosis present

## 2022-05-31 DIAGNOSIS — Z79899 Other long term (current) drug therapy: Secondary | ICD-10-CM

## 2022-05-31 DIAGNOSIS — R000 Tachycardia, unspecified: Secondary | ICD-10-CM | POA: Diagnosis not present

## 2022-05-31 DIAGNOSIS — R0602 Shortness of breath: Secondary | ICD-10-CM | POA: Diagnosis not present

## 2022-05-31 DIAGNOSIS — F1010 Alcohol abuse, uncomplicated: Secondary | ICD-10-CM | POA: Diagnosis present

## 2022-05-31 DIAGNOSIS — R0902 Hypoxemia: Secondary | ICD-10-CM | POA: Diagnosis not present

## 2022-05-31 DIAGNOSIS — I10 Essential (primary) hypertension: Secondary | ICD-10-CM | POA: Diagnosis not present

## 2022-05-31 DIAGNOSIS — I452 Bifascicular block: Secondary | ICD-10-CM | POA: Diagnosis present

## 2022-05-31 DIAGNOSIS — F32A Depression, unspecified: Secondary | ICD-10-CM | POA: Diagnosis present

## 2022-05-31 DIAGNOSIS — N1831 Chronic kidney disease, stage 3a: Secondary | ICD-10-CM | POA: Diagnosis present

## 2022-05-31 DIAGNOSIS — F4310 Post-traumatic stress disorder, unspecified: Secondary | ICD-10-CM | POA: Diagnosis present

## 2022-05-31 DIAGNOSIS — J849 Interstitial pulmonary disease, unspecified: Secondary | ICD-10-CM | POA: Diagnosis present

## 2022-05-31 DIAGNOSIS — Z1152 Encounter for screening for COVID-19: Secondary | ICD-10-CM

## 2022-05-31 DIAGNOSIS — Z888 Allergy status to other drugs, medicaments and biological substances status: Secondary | ICD-10-CM

## 2022-05-31 DIAGNOSIS — I5A Non-ischemic myocardial injury (non-traumatic): Secondary | ICD-10-CM | POA: Diagnosis present

## 2022-05-31 DIAGNOSIS — F17210 Nicotine dependence, cigarettes, uncomplicated: Secondary | ICD-10-CM | POA: Diagnosis present

## 2022-05-31 DIAGNOSIS — M1990 Unspecified osteoarthritis, unspecified site: Secondary | ICD-10-CM | POA: Diagnosis present

## 2022-05-31 DIAGNOSIS — K219 Gastro-esophageal reflux disease without esophagitis: Secondary | ICD-10-CM | POA: Diagnosis present

## 2022-05-31 DIAGNOSIS — K861 Other chronic pancreatitis: Secondary | ICD-10-CM | POA: Diagnosis present

## 2022-05-31 DIAGNOSIS — J441 Chronic obstructive pulmonary disease with (acute) exacerbation: Secondary | ICD-10-CM | POA: Diagnosis present

## 2022-05-31 DIAGNOSIS — J9601 Acute respiratory failure with hypoxia: Principal | ICD-10-CM | POA: Diagnosis present

## 2022-05-31 DIAGNOSIS — I129 Hypertensive chronic kidney disease with stage 1 through stage 4 chronic kidney disease, or unspecified chronic kidney disease: Secondary | ICD-10-CM | POA: Diagnosis present

## 2022-05-31 DIAGNOSIS — E785 Hyperlipidemia, unspecified: Secondary | ICD-10-CM | POA: Diagnosis present

## 2022-05-31 DIAGNOSIS — Z825 Family history of asthma and other chronic lower respiratory diseases: Secondary | ICD-10-CM

## 2022-05-31 DIAGNOSIS — Z833 Family history of diabetes mellitus: Secondary | ICD-10-CM

## 2022-05-31 DIAGNOSIS — R069 Unspecified abnormalities of breathing: Secondary | ICD-10-CM | POA: Diagnosis not present

## 2022-05-31 NOTE — ED Notes (Signed)
Patient to waiting room via wheelchair by EMS from home for shortness of breath and abd pain that started today.  EMS interventions -- 12 showing RBB with HR 78, bp 208/118 (out of meds for 2 weeks), pulse oxi 98% on room air.

## 2022-06-01 ENCOUNTER — Inpatient Hospital Stay
Admission: EM | Admit: 2022-06-01 | Discharge: 2022-06-03 | DRG: 189 | Disposition: A | Discharge status: Home / Self Care | Payer: Medicaid Other | Attending: Osteopathic Medicine | Admitting: Osteopathic Medicine

## 2022-06-01 ENCOUNTER — Encounter: Payer: Self-pay | Admitting: Internal Medicine

## 2022-06-01 ENCOUNTER — Emergency Department: Payer: Medicaid Other

## 2022-06-01 ENCOUNTER — Other Ambulatory Visit: Payer: Self-pay

## 2022-06-01 DIAGNOSIS — N1831 Chronic kidney disease, stage 3a: Secondary | ICD-10-CM | POA: Diagnosis present

## 2022-06-01 DIAGNOSIS — R0902 Hypoxemia: Secondary | ICD-10-CM

## 2022-06-01 DIAGNOSIS — E785 Hyperlipidemia, unspecified: Secondary | ICD-10-CM | POA: Diagnosis present

## 2022-06-01 DIAGNOSIS — Z833 Family history of diabetes mellitus: Secondary | ICD-10-CM | POA: Diagnosis not present

## 2022-06-01 DIAGNOSIS — E1169 Type 2 diabetes mellitus with other specified complication: Secondary | ICD-10-CM | POA: Diagnosis present

## 2022-06-01 DIAGNOSIS — Z720 Tobacco use: Secondary | ICD-10-CM | POA: Diagnosis present

## 2022-06-01 DIAGNOSIS — J849 Interstitial pulmonary disease, unspecified: Secondary | ICD-10-CM | POA: Diagnosis present

## 2022-06-01 DIAGNOSIS — R0602 Shortness of breath: Principal | ICD-10-CM

## 2022-06-01 DIAGNOSIS — I5A Non-ischemic myocardial injury (non-traumatic): Secondary | ICD-10-CM | POA: Diagnosis present

## 2022-06-01 DIAGNOSIS — K861 Other chronic pancreatitis: Secondary | ICD-10-CM | POA: Diagnosis present

## 2022-06-01 DIAGNOSIS — K860 Alcohol-induced chronic pancreatitis: Secondary | ICD-10-CM | POA: Diagnosis not present

## 2022-06-01 DIAGNOSIS — I452 Bifascicular block: Secondary | ICD-10-CM | POA: Diagnosis present

## 2022-06-01 DIAGNOSIS — H0520 Unspecified exophthalmos: Secondary | ICD-10-CM

## 2022-06-01 DIAGNOSIS — Z888 Allergy status to other drugs, medicaments and biological substances status: Secondary | ICD-10-CM | POA: Diagnosis not present

## 2022-06-01 DIAGNOSIS — I10 Essential (primary) hypertension: Secondary | ICD-10-CM | POA: Diagnosis present

## 2022-06-01 DIAGNOSIS — F1010 Alcohol abuse, uncomplicated: Secondary | ICD-10-CM | POA: Diagnosis present

## 2022-06-01 DIAGNOSIS — J441 Chronic obstructive pulmonary disease with (acute) exacerbation: Secondary | ICD-10-CM | POA: Diagnosis present

## 2022-06-01 DIAGNOSIS — F17210 Nicotine dependence, cigarettes, uncomplicated: Secondary | ICD-10-CM | POA: Diagnosis present

## 2022-06-01 DIAGNOSIS — Z79899 Other long term (current) drug therapy: Secondary | ICD-10-CM | POA: Diagnosis not present

## 2022-06-01 DIAGNOSIS — Z1152 Encounter for screening for COVID-19: Secondary | ICD-10-CM | POA: Diagnosis not present

## 2022-06-01 DIAGNOSIS — E7800 Pure hypercholesterolemia, unspecified: Secondary | ICD-10-CM

## 2022-06-01 DIAGNOSIS — G629 Polyneuropathy, unspecified: Secondary | ICD-10-CM | POA: Diagnosis present

## 2022-06-01 DIAGNOSIS — I152 Hypertension secondary to endocrine disorders: Secondary | ICD-10-CM | POA: Diagnosis present

## 2022-06-01 DIAGNOSIS — M1990 Unspecified osteoarthritis, unspecified site: Secondary | ICD-10-CM | POA: Diagnosis present

## 2022-06-01 DIAGNOSIS — K219 Gastro-esophageal reflux disease without esophagitis: Secondary | ICD-10-CM | POA: Diagnosis present

## 2022-06-01 DIAGNOSIS — F4310 Post-traumatic stress disorder, unspecified: Secondary | ICD-10-CM | POA: Diagnosis present

## 2022-06-01 DIAGNOSIS — F329 Major depressive disorder, single episode, unspecified: Secondary | ICD-10-CM | POA: Diagnosis not present

## 2022-06-01 DIAGNOSIS — F32A Depression, unspecified: Secondary | ICD-10-CM | POA: Diagnosis present

## 2022-06-01 DIAGNOSIS — F5104 Psychophysiologic insomnia: Secondary | ICD-10-CM

## 2022-06-01 DIAGNOSIS — I129 Hypertensive chronic kidney disease with stage 1 through stage 4 chronic kidney disease, or unspecified chronic kidney disease: Secondary | ICD-10-CM | POA: Diagnosis present

## 2022-06-01 DIAGNOSIS — J9601 Acute respiratory failure with hypoxia: Secondary | ICD-10-CM | POA: Diagnosis present

## 2022-06-01 DIAGNOSIS — Z825 Family history of asthma and other chronic lower respiratory diseases: Secondary | ICD-10-CM | POA: Diagnosis not present

## 2022-06-01 DIAGNOSIS — R1010 Upper abdominal pain, unspecified: Secondary | ICD-10-CM

## 2022-06-01 LAB — CBC
HCT: 42.7 % (ref 39.0–52.0)
Hemoglobin: 13.8 g/dL (ref 13.0–17.0)
MCH: 31.7 pg (ref 26.0–34.0)
MCHC: 32.3 g/dL (ref 30.0–36.0)
MCV: 97.9 fL (ref 80.0–100.0)
Platelets: 288 10*3/uL (ref 150–400)
RBC: 4.36 MIL/uL (ref 4.22–5.81)
RDW: 13.2 % (ref 11.5–15.5)
WBC: 10.8 10*3/uL — ABNORMAL HIGH (ref 4.0–10.5)
nRBC: 0 % (ref 0.0–0.2)

## 2022-06-01 LAB — HEMOGLOBIN A1C
Hgb A1c MFr Bld: 6.3 % — ABNORMAL HIGH (ref 4.8–5.6)
Mean Plasma Glucose: 134.11 mg/dL

## 2022-06-01 LAB — COMPREHENSIVE METABOLIC PANEL
ALT: 10 U/L (ref 0–44)
AST: 20 U/L (ref 15–41)
Albumin: 3.7 g/dL (ref 3.5–5.0)
Alkaline Phosphatase: 93 U/L (ref 38–126)
Anion gap: 4 — ABNORMAL LOW (ref 5–15)
BUN: 12 mg/dL (ref 6–20)
CO2: 25 mmol/L (ref 22–32)
Calcium: 9 mg/dL (ref 8.9–10.3)
Chloride: 109 mmol/L (ref 98–111)
Creatinine, Ser: 1.22 mg/dL (ref 0.61–1.24)
GFR, Estimated: >60 mL/min (ref >60)
Glucose, Bld: 108 mg/dL — ABNORMAL HIGH (ref 70–99)
Potassium: 4.1 mmol/L (ref 3.5–5.1)
Sodium: 138 mmol/L (ref 135–145)
Total Bilirubin: 0.5 mg/dL (ref 0.3–1.2)
Total Protein: 8.6 g/dL — ABNORMAL HIGH (ref 6.5–8.1)

## 2022-06-01 LAB — TROPONIN I (HIGH SENSITIVITY)
Troponin I (High Sensitivity): 29 ng/L — ABNORMAL HIGH (ref <18)
Troponin I (High Sensitivity): 34 ng/L — ABNORMAL HIGH (ref <18)

## 2022-06-01 LAB — LIPASE, BLOOD: Lipase: 51 U/L (ref 11–51)

## 2022-06-01 MED ORDER — HYDROMORPHONE HCL 1 MG/ML IJ SOLN
1.0000 mg | Freq: Once | INTRAMUSCULAR | Status: AC
  Administered 2022-06-01: 1 mg via INTRAVENOUS
  Filled 2022-06-01: qty 1

## 2022-06-01 MED ORDER — NICOTINE 21 MG/24HR TD PT24
21.0000 mg | MEDICATED_PATCH | Freq: Every day | TRANSDERMAL | Status: DC
  Administered 2022-06-01 – 2022-06-03 (×3): 21 mg via TRANSDERMAL
  Filled 2022-06-01 (×3): qty 1

## 2022-06-01 MED ORDER — ENOXAPARIN SODIUM 40 MG/0.4ML IJ SOSY
40.0000 mg | PREFILLED_SYRINGE | INTRAMUSCULAR | Status: DC
  Administered 2022-06-02 (×2): 40 mg via SUBCUTANEOUS
  Filled 2022-06-01 (×2): qty 0.4

## 2022-06-01 MED ORDER — LORAZEPAM 2 MG/ML IJ SOLN: 0.0000 mg | Freq: Two times a day (BID) | INTRAMUSCULAR | Status: DC

## 2022-06-01 MED ORDER — LORAZEPAM 1 MG PO TABS: 1.0000 mg | ORAL_TABLET | ORAL | Status: DC | PRN

## 2022-06-01 MED ORDER — SODIUM CHLORIDE 0.9 % IV BOLUS
1000.0000 mL | Freq: Once | INTRAVENOUS | Status: AC
  Administered 2022-06-01: 1000 mL via INTRAVENOUS

## 2022-06-01 MED ORDER — FAMOTIDINE 20 MG PO TABS
20.0000 mg | ORAL_TABLET | Freq: Two times a day (BID) | ORAL | Status: DC
  Administered 2022-06-01 – 2022-06-03 (×4): 20 mg via ORAL
  Filled 2022-06-01 (×4): qty 1

## 2022-06-01 MED ORDER — BUPROPION HCL ER (XL) 150 MG PO TB24
150.0000 mg | ORAL_TABLET | Freq: Every day | ORAL | Status: DC
  Administered 2022-06-01 – 2022-06-03 (×3): 150 mg via ORAL
  Filled 2022-06-01 (×3): qty 1

## 2022-06-01 MED ORDER — IPRATROPIUM-ALBUTEROL 0.5-2.5 (3) MG/3ML IN SOLN
3.0000 mL | Freq: Once | RESPIRATORY_TRACT | Status: AC
  Administered 2022-06-01: 3 mL via RESPIRATORY_TRACT
  Filled 2022-06-01: qty 3

## 2022-06-01 MED ORDER — LORAZEPAM 2 MG/ML IJ SOLN: 1.0000 mg | INTRAMUSCULAR | Status: DC | PRN

## 2022-06-01 MED ORDER — PANTOPRAZOLE SODIUM 40 MG PO TBEC
40.0000 mg | DELAYED_RELEASE_TABLET | Freq: Every day | ORAL | Status: DC
  Administered 2022-06-01 – 2022-06-03 (×3): 40 mg via ORAL
  Filled 2022-06-01 (×3): qty 1

## 2022-06-01 MED ORDER — SODIUM CHLORIDE 0.9 % IV SOLN: INTRAVENOUS | Status: DC

## 2022-06-01 MED ORDER — ACETAMINOPHEN 325 MG PO TABS: 650.0000 mg | ORAL_TABLET | Freq: Four times a day (QID) | ORAL | Status: DC | PRN

## 2022-06-01 MED ORDER — HYDROMORPHONE HCL 1 MG/ML IJ SOLN
1.0000 mg | INTRAMUSCULAR | Status: DC | PRN
  Administered 2022-06-01 – 2022-06-03 (×9): 1 mg via INTRAVENOUS
  Filled 2022-06-01 (×11): qty 1

## 2022-06-01 MED ORDER — ALBUTEROL SULFATE (2.5 MG/3ML) 0.083% IN NEBU: 2.5000 mg | INHALATION_SOLUTION | RESPIRATORY_TRACT | Status: DC | PRN

## 2022-06-01 MED ORDER — FOLIC ACID 1 MG PO TABS
1.0000 mg | ORAL_TABLET | Freq: Every day | ORAL | Status: DC
  Administered 2022-06-01 – 2022-06-03 (×3): 1 mg via ORAL
  Filled 2022-06-01 (×3): qty 1

## 2022-06-01 MED ORDER — METHYLPREDNISOLONE SODIUM SUCC 125 MG IJ SOLR
125.0000 mg | Freq: Once | INTRAMUSCULAR | Status: AC
  Administered 2022-06-01: 125 mg via INTRAVENOUS
  Filled 2022-06-01: qty 2

## 2022-06-01 MED ORDER — LORAZEPAM 2 MG/ML IJ SOLN: 0.0000 mg | Freq: Four times a day (QID) | INTRAMUSCULAR | Status: DC

## 2022-06-01 MED ORDER — AZITHROMYCIN 500 MG PO TABS
250.0000 mg | ORAL_TABLET | Freq: Every day | ORAL | Status: DC
  Administered 2022-06-02 – 2022-06-03 (×2): 250 mg via ORAL
  Filled 2022-06-01 (×2): qty 1

## 2022-06-01 MED ORDER — THIAMINE HCL 100 MG/ML IJ SOLN
100.0000 mg | Freq: Every day | INTRAMUSCULAR | Status: DC
  Filled 2022-06-01: qty 2

## 2022-06-01 MED ORDER — AZITHROMYCIN 500 MG PO TABS
500.0000 mg | ORAL_TABLET | Freq: Every day | ORAL | Status: AC
  Administered 2022-06-01: 500 mg via ORAL
  Filled 2022-06-01: qty 1

## 2022-06-01 MED ORDER — THIAMINE MONONITRATE 100 MG PO TABS
100.0000 mg | ORAL_TABLET | Freq: Every day | ORAL | Status: DC
  Administered 2022-06-01 – 2022-06-03 (×3): 100 mg via ORAL
  Filled 2022-06-01 (×3): qty 1

## 2022-06-01 MED ORDER — LORAZEPAM 2 MG/ML IJ SOLN: 0.0000 mg | Freq: Four times a day (QID) | INTRAMUSCULAR | Status: AC

## 2022-06-01 MED ORDER — ONDANSETRON HCL 4 MG/2ML IJ SOLN
4.0000 mg | Freq: Once | INTRAMUSCULAR | Status: AC
  Administered 2022-06-01: 4 mg via INTRAVENOUS
  Filled 2022-06-01: qty 2

## 2022-06-01 MED ORDER — IPRATROPIUM-ALBUTEROL 0.5-2.5 (3) MG/3ML IN SOLN
3.0000 mL | RESPIRATORY_TRACT | Status: DC
  Administered 2022-06-01 – 2022-06-02 (×8): 3 mL via RESPIRATORY_TRACT
  Filled 2022-06-01 (×9): qty 3

## 2022-06-01 MED ORDER — HYDRALAZINE HCL 20 MG/ML IJ SOLN
5.0000 mg | INTRAMUSCULAR | Status: DC | PRN
  Administered 2022-06-02 – 2022-06-03 (×3): 5 mg via INTRAVENOUS
  Filled 2022-06-01 (×3): qty 1

## 2022-06-01 MED ORDER — ONDANSETRON HCL 4 MG/2ML IJ SOLN: 4.0000 mg | Freq: Three times a day (TID) | INTRAMUSCULAR | Status: DC | PRN

## 2022-06-01 MED ORDER — ASPIRIN 81 MG PO TBEC
81.0000 mg | DELAYED_RELEASE_TABLET | Freq: Every day | ORAL | Status: DC
  Administered 2022-06-01 – 2022-06-03 (×3): 81 mg via ORAL
  Filled 2022-06-01 (×3): qty 1

## 2022-06-01 MED ORDER — GABAPENTIN 100 MG PO CAPS
100.0000 mg | ORAL_CAPSULE | Freq: Every evening | ORAL | Status: DC | PRN
  Administered 2022-06-02: 100 mg via ORAL
  Filled 2022-06-01: qty 1

## 2022-06-01 MED ORDER — DM-GUAIFENESIN ER 30-600 MG PO TB12
1.0000 | ORAL_TABLET | Freq: Two times a day (BID) | ORAL | Status: DC | PRN
  Administered 2022-06-03: 1 via ORAL
  Filled 2022-06-01 (×2): qty 1

## 2022-06-01 MED ORDER — OLOPATADINE HCL 0.1 % OP SOLN
1.0000 [drp] | Freq: Every day | OPHTHALMIC | Status: DC
  Administered 2022-06-02: 1 [drp] via OPHTHALMIC
  Filled 2022-06-01 (×2): qty 5

## 2022-06-01 MED ORDER — TRAZODONE HCL 50 MG PO TABS
150.0000 mg | ORAL_TABLET | Freq: Every evening | ORAL | Status: DC | PRN
  Administered 2022-06-03: 150 mg via ORAL
  Filled 2022-06-01: qty 3

## 2022-06-01 MED ORDER — OXYCODONE-ACETAMINOPHEN 5-325 MG PO TABS
1.0000 | ORAL_TABLET | ORAL | Status: DC | PRN
  Administered 2022-06-02 (×3): 1 via ORAL
  Filled 2022-06-01 (×3): qty 1

## 2022-06-01 MED ORDER — METHYLPREDNISOLONE SODIUM SUCC 40 MG IJ SOLR
40.0000 mg | Freq: Two times a day (BID) | INTRAMUSCULAR | Status: DC
  Administered 2022-06-01 – 2022-06-03 (×4): 40 mg via INTRAVENOUS
  Filled 2022-06-01 (×4): qty 1

## 2022-06-01 MED ORDER — AMLODIPINE BESYLATE 10 MG PO TABS
10.0000 mg | ORAL_TABLET | Freq: Every day | ORAL | Status: DC
  Administered 2022-06-01 – 2022-06-03 (×3): 10 mg via ORAL
  Filled 2022-06-01: qty 2
  Filled 2022-06-01: qty 1
  Filled 2022-06-01: qty 2

## 2022-06-01 MED ORDER — THIAMINE HCL 100 MG PO TABS
100.0000 mg | ORAL_TABLET | Freq: Every day | ORAL | Status: DC
Start: 2022-06-01 — End: 2022-06-01
  Filled 2022-06-01: qty 1

## 2022-06-01 MED ORDER — ADULT MULTIVITAMIN W/MINERALS CH
1.0000 | ORAL_TABLET | Freq: Every day | ORAL | Status: DC
  Administered 2022-06-01 – 2022-06-03 (×3): 1 via ORAL
  Filled 2022-06-01 (×3): qty 1

## 2022-06-01 MED ORDER — PANCRELIPASE (LIP-PROT-AMYL) 12000-38000 UNITS PO CPEP
12000.0000 [IU] | ORAL_CAPSULE | Freq: Three times a day (TID) | ORAL | Status: DC
  Administered 2022-06-01 – 2022-06-03 (×7): 12000 [IU] via ORAL
  Filled 2022-06-01 (×8): qty 1

## 2022-06-01 NOTE — H&P (Signed)
History and Physical    Noah Velasquez OBS:[id number] DOB: [birth year] DOA: 06/01/2022  Referring MD/NP/PA:   PCP: Noah Fenton, NP   Patient coming from:  The patient is coming from home.  At baseline, pt is independent for most of ADL.        Chief Complaint: cough, SOB  HPI: Noah Velasquez is a 56 y.o. male with medical history significant of hypertension, hyperlipidemia, GERD, depression, CKD-3A, chronic back cardiac pancreatitis, alcohol abuse, tobacco abuse, interstitial lung disease, who presents with cough and shortness breath.   Patient states that he has cough and shortness breath for more than 3 days, which has been progressively worsening.  Patient coughs up dark brown-colored sputum.  No fever or chills.  No chest pain.  Patient also reports chronic abdominal pain, which is slightly worsened than baseline.  Abdominal pain is located in left upper abdomen, constant, 8 out of 10 in severity, nonradiating.  Associated with nausea and few times of nonbilious nonbloody vomiting.  No diarrhea.  Currently no vomiting.  Patient states he continues to drink, last drink was 3 days ago.  Patient is normally not using oxygen at home. He has oxygen desaturating to 86% on RA, with acute respiratory distress, and difficulty speaking in full sentence.  2 L oxygen was started in ED.  Data reviewed independently and ED Course: pt was found to have WBC 10.8, troponin level 29, 34, lipase 51, GFR> 60, pending COVID PCR, temperature normal, blood pressure 167/96, heart rate 83, chest x-ray negative for infiltration, but showed chronic interstitial lung disease.  Patient is admitted to telemetry bed as inpatient.   EKG: I have personally reviewed.  Sinus rhythm, QTc 487, bifascicular block, early R wave progression.   Review of Systems:   General: no fevers, chills, no body weight gain, has fatigue HEENT: no blurry vision, hearing changes or sore throat Respiratory: has dyspnea, coughing,  wheezing CV: no chest pain, no palpitations GI: no nausea, vomiting, abdominal pain, diarrhea, constipation GU: no dysuria, burning on urination, increased urinary frequency, hematuria  Ext: no leg edema Neuro: no unilateral weakness, numbness, or tingling, no vision change or hearing loss Skin: no rash, no skin tear. MSK: No muscle spasm, no deformity, no limitation of range of movement in spin Heme: No easy bruising.  Travel history: No recent long distant travel.   Allergy:  Allergies  Allergen Reactions   Hctz [Hydrochlorothiazide] Swelling    Past Medical History:  Diagnosis Date   Chronic hip pain    Chronic pancreatitis (HCC)    CKD (chronic kidney disease) stage 3, GFR 30-59 ml/min (HCC)    GERD (gastroesophageal reflux disease)    Hyperlipidemia    Hypertension    Osteoarthritis    Pancreatitis    Pancreatitis    PTSD (post-traumatic stress disorder)    Viral infection of left eye     Past Surgical History:  Procedure Laterality Date   pancreatic stent placement      Social History:  reports that he has been smoking cigarettes. He has a 3.75 pack-year smoking history. He has never used smokeless tobacco. He reports current alcohol use of about 7.0 standard drinks of alcohol per week. He reports current drug use. Drug: Marijuana.  Family History:  Family History  Problem Relation Age of Onset   Diabetes Mother    Sleep apnea Mother    Emphysema Sister    CAD Neg Hx    Mental illness Neg Hx  Prior to Admission medications   Medication Sig Start Date End Date Taking? Authorizing Provider  amLODipine (NORVASC) 10 MG tablet TAKE 1 TABLET BY MOUTH ONCE DAILY 04/14/22   Noah Fenton, NP  Blood Pressure KIT Please allow patient to chose from machines that are covered under insurance 08/30/20   Noah Velasquez, Noah Raider, FNP  buPROPion (WELLBUTRIN XL) 150 MG 24 hr tablet TAKE 1 TABLET BY MOUTH ONCE DAILY 04/14/22   Noah Fenton, NP  famotidine (PEPCID) 20 MG tablet  TAKE 1 TABLET BY MOUTH TWICE DAILY 04/14/22   Noah Fenton, NP  fluticasone (FLONASE) 50 MCG/ACT nasal spray Place 2 sprays into both nostrils daily. 12/10/21   Noah Fenton, NP  gabapentin (NEURONTIN) 100 MG capsule Take 1 capsule (100 mg total) by mouth at bedtime as needed (sleep). 06/11/21   Noah Fenton, NP  naproxen (NAPROSYN) 500 MG tablet TAKE 1 TABLET BY MOUTH TWICE DAILY AS NEEDED FOR MILD OR MODERATE PAIN 01/27/22   Noah Fenton, NP  Olopatadine HCl 0.2 % SOLN APPLY 1 DROP TO EYE ONCE DAILY 07/23/21   Noah Fenton, NP  omeprazole (PRILOSEC) 20 MG capsule TAKE 1 CAPSULE BY MOUTH ONCE DAILY 04/14/22   Noah Fenton, NP  Pancrelipase, Lip-Prot-Amyl, (ZENPEP) 15000-47000 units CPEP TAKE 1 CAPSULE BY MOUTH 3 TIMES DAILY WITH MEALS 01/27/22   Noah Fenton, NP  sertraline (ZOLOFT) 100 MG tablet TAKE 1 TABLET BY MOUTH ONCE DAILY *PLEASE MAKE APPOINTMENT FOR FURTHER FILLS* 04/14/22   Noah Fenton, NP  traZODone (DESYREL) 150 MG tablet Take 1 tablet (150 mg total) by mouth at bedtime as needed for sleep. 10/20/18   Noah College, NP  VENTOLIN HFA 108 (90 Base) MCG/ACT inhaler INHALE 1-2 PUFFS INTO THE LLUNGS EVERY 6HOURS AS NEEDED FOR WHEEZING OR SHORTNESS OF BREATH 12/31/21   Noah Fenton, NP    Physical Exam: Vitals:   06/01/22 0548 06/01/22 0800 06/01/22 1224 06/01/22 1551  BP: (!) 161/91 (!) 167/96 (!) 160/88 (!) 145/80  Pulse: 73 78 80   Resp: _0 Temp: 97.9 F (36.6 C) 98.6 F (37 C) 98 F (36.7 C)   TempSrc: Oral Oral Oral   SpO2: 100% 99% 99%   Weight:      Height:       General: in acute respiratory distress HEENT:       Eyes: PERRL, EOMI, no scleral icterus.       ENT: No discharge from the ears and nose, no pharynx injection, no tonsillar enlargement.        Neck: No JVD, no bruit, no mass felt. Heme: No neck lymph node enlargement. Cardiac: S1/S2, RRR, No murmurs, No gallops or rubs. Respiratory: Has wheezing bilaterally. GI: Soft,  nondistended, nontender, no rebound pain, no organomegaly, BS present. GU: No hematuria Ext: No pitting leg edema bilaterally. 1+DP/PT pulse bilaterally. Musculoskeletal: No joint deformities, No joint redness or warmth, no limitation of ROM in spin. Skin: No rashes.  Neuro: Alert, oriented X3, cranial nerves II-XII grossly intact, moves all extremities normally.  Psych: Patient is not psychotic, no suicidal or hemocidal ideation.  Labs on Admission: I have personally reviewed following labs and imaging studies  CBC: Recent Labs  Lab 06/01/22 0004  WBC 10.8*  HGB 13.8  HCT 42.7  MCV 97.9  PLT 291   Basic Metabolic Panel: Recent Labs  Lab 06/01/22 0004  NA 138  K 4.1  CL 109  CO2  25  GLUCOSE 108*  BUN 12  CREATININE 1.22  CALCIUM 9.0   GFR: Estimated Creatinine Clearance: 63.2 mL/min (by C-G formula based on SCr of 1.22 mg/dL). Liver Function Tests: Recent Labs  Lab 06/01/22 0004  AST 20  ALT 10  ALKPHOS 93  BILITOT 0.5  PROT 8.6*  ALBUMIN 3.7   Recent Labs  Lab 06/01/22 0004  LIPASE 51   No results for input(s): "AMMONIA" in the last 168 hours. Coagulation Profile: No results for input(s): "INR", "PROTIME" in the last 168 hours. Cardiac Enzymes: No results for input(s): "CKTOTAL", "CKMB", "CKMBINDEX", "TROPONINI" in the last 168 hours. BNP (last 3 results) No results for input(s): "PROBNP" in the last 8760 hours. HbA1C: Recent Labs    06/01/22 0005  HGBA1C 6.3*   CBG: No results for input(s): "GLUCAP" in the last 168 hours. Lipid Profile: No results for input(s): "CHOL", "HDL", "LDLCALC", "TRIG", "CHOLHDL", "LDLDIRECT" in the last 72 hours. Thyroid Function Tests: No results for input(s): "TSH", "T4TOTAL", "FREET4", "T3FREE", "THYROIDAB" in the last 72 hours. Anemia Panel: No results for input(s): "VITAMINB12", "FOLATE", "FERRITIN", "TIBC", "IRON", "RETICCTPCT" in the last 72 hours. Urine analysis:    Component Value Date/Time   COLORURINE  STRAW (A) 12/08/2021 1929   APPEARANCEUR CLEAR (A) 12/08/2021 1929   LABSPEC 1.008 12/08/2021 1929   PHURINE 7.0 12/08/2021 1929   GLUCOSEU NEGATIVE 12/08/2021 1929   HGBUR NEGATIVE 12/08/2021 1929   BILIRUBINUR NEGATIVE 12/08/2021 1929   BILIRUBINUR Negative 10/20/2018 1443   KETONESUR 5 (A) 12/08/2021 1929   PROTEINUR NEGATIVE 12/08/2021 1929   UROBILINOGEN 0.2 10/20/2018 1443   NITRITE NEGATIVE 12/08/2021 1929   LEUKOCYTESUR NEGATIVE 12/08/2021 1929   Sepsis Labs: _0 (procalcitonin:4,lacticidven:4) )No results found for this or any previous visit (from the past 240 hour(s)).   Radiological Exams on Admission: DG Chest 2 View  Result Date: 06/01/2022 CLINICAL DATA:  Shortness of breath EXAM: CHEST - 2 VIEW COMPARISON:  12/08/2021 FINDINGS: Subpleural reticulation/fibrosis in the lungs bilaterally, lower lobe predominant, suggesting chronic interstitial lung disease. No focal consolidation. The diffusion The heart is normal in size. Visualized osseous structures are within normal limits. IMPRESSION: No evidence of acute cardiopulmonary disease. Stable chronic interstitial lung disease. Electronically Signed   By: Julian Hy M.D.   On: 06/01/2022 00:26      Assessment/Plan Principal Problem:   Acute respiratory failure with hypoxia (HCC) Active Problems:   ILD (interstitial lung disease) (HCC)   Chronic pancreatitis (HCC)   Essential hypertension   HLD (hyperlipidemia)   Myocardial injury   Depression   Alcohol abuse   Tobacco abuse   Assessment and Plan: * Acute respiratory failure with hypoxia ([place]) Patient has productive cough, wheezing, 2 L new oxygen requirement.  Chest x-ray negative for infiltration.  Possibly due to ILD exacerbation and possible undiagnosed COPD with acute exacerbation.  Patient is a longtime smoker.  - will admit to tele bed as inpatient -Bronchodilators -Solu-Medrol 40 mg IV bid -Z pak  -Mucinex for cough  -Incentive  spirometry -sputum culture -check Covid19  -Nasal cannula oxygen as needed to maintain O2 saturation 93% or greater    ILD (interstitial lung disease) ([place]) -see above  Chronic pancreatitis ([place]) Patient has worsening abdominal pain, but lipase normal.  Most likely due to chronic pancreatitis. -Call pain control: As needed Dilaudid, Percocet -IV fluid: 1 L normal saline, 125 cc/h -Full liquid diet -continue Creon  Essential hypertension - IV hydralazine as needed -Amlodipine  HLD (hyperlipidemia) - Patient's not taking  medications currently -Follow-up with PCP  Myocardial injury Myocardial injury due to acute respiratory failure with hypoxia: Troponin level 29, 34.  Denies chest pain. -Trend troponin -Check A1c, FLP -Aspirin 81 mg daily   Depression - Continue home medications  Alcohol abuse - Did Counseling about importance of quitting alcohol use. -CIWA protocol  Tobacco abuse - Did counseling about importance of quitting smoking -Nicotine patch          DVT ppx:    SQ Lovenox  Code Status: Full code  Family Communication: not done, no family member is at bed side.    Disposition Plan:  Anticipate discharge back to previous environment  Consults called:  none  Admission status and Level of care: Telemetry Medical:    as inpt        Dispo: The patient is from: Home              Anticipated d/c is to: Home              Anticipated d/c date is: 2 days              Patient currently is not medically stable to d/c.    Severity of Illness:  The appropriate patient status for this patient is INPATIENT. Inpatient status is judged to be reasonable and necessary in order to provide the required intensity of service to ensure the patient's safety. The patient's presenting symptoms, physical exam findings, and initial radiographic and laboratory data in the context of their chronic comorbidities is felt to place them at high risk for further clinical  deterioration. Furthermore, it is not anticipated that the patient will be medically stable for discharge from the hospital within 2 midnights of admission.   * I certify that at the point of admission it is my clinical judgment that the patient will require inpatient hospital care spanning beyond 2 midnights from the point of admission due to high intensity of service, high risk for further deterioration and high frequency of surveillance required.*       Date of Service 06/01/2022    Ivor Costa [facility]   If 7PM-7AM, please contact night-coverage [email or web address] 06/01/2022, 4:29 PM

## 2022-06-01 NOTE — Assessment & Plan Note (Signed)
-  see above 

## 2022-06-01 NOTE — Assessment & Plan Note (Signed)
-   IV hydralazine as needed -Amlodipine 

## 2022-06-01 NOTE — Assessment & Plan Note (Signed)
Myocardial injury due to acute respiratory failure with hypoxia: Troponin level 29, 34.  Denies chest pain. -Trend troponin -Check A1c, FLP -Aspirin 81 mg daily

## 2022-06-01 NOTE — ED Provider Notes (Signed)
[facility] Provider Note    Event Date/Time   First MD Initiated Contact with Patient 06/01/22 0502     (approximate)   History   Shortness of Breath and Abdominal Pain   HPI  Noah Velasquez is a 56 y.o. male who presents to the ED with a chief complaint of shortness of breath, abdominal pain and nausea.  Patient with a history of asthma and "probably COPD my doctor says" who states he has felt short of breath with mild cough for the past several days.  Also with a history of alcohol induced pancreatitis who is having left upper quadrant abdominal pain associated with nausea.  Denies daily EtOH; states last drink several days ago.  Denies history of DTs.  Denies fever, chest pain, vomiting or diarrhea.     Past Medical History   Past Medical History:  Diagnosis Date   Chronic hip pain    Chronic pancreatitis ([place])    CKD (chronic kidney disease) stage 3, GFR 30-59 ml/min (HCC)    GERD (gastroesophageal reflux disease)    Hyperlipidemia    Hypertension    Osteoarthritis    Pancreatitis    Pancreatitis    PTSD (post-traumatic stress disorder)    Viral infection of left eye      Active Problem List   Patient Active Problem List   Diagnosis Date Noted   Peripheral neuropathy 12/22/2021   Aortic atherosclerosis ([place]) 12/10/2021   Chronic lung disease 12/10/2021   Overweight with body mass index (BMI) of 28 to 28.9 in adult 09/23/2021   HLD (hyperlipidemia) 01/26/2021   GERD (gastroesophageal reflux disease) 01/26/2021   Chronic pancreatitis ([place]) 01/26/2021   Erectile dysfunction 01/10/2020   MDD (major depressive disorder), recurrent, in full remission ([place]) 06/05/2019   Alcohol use disorder, moderate, in sustained remission ([place]) 04/06/2019   Anemia 03/09/2016   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 Authorizing Provider  amLODipine (NORVASC) 10 MG tablet TAKE 1 TABLET BY MOUTH ONCE DAILY 04/14/22   Jearld Fenton, NP  Blood Pressure KIT Please allow patient to chose from machines that are covered under insurance 08/30/20   Lorine Bears, Lupita Raider, FNP  buPROPion (WELLBUTRIN XL) 150 MG 24 hr tablet TAKE 1 TABLET BY MOUTH ONCE DAILY 04/14/22   Jearld Fenton, NP  famotidine (PEPCID) 20 MG tablet TAKE 1 TABLET BY MOUTH TWICE DAILY 04/14/22   Jearld Fenton, NP  fluticasone (FLONASE) 50 MCG/ACT nasal spray Place 2 sprays into both nostrils daily. 12/10/21   Jearld Fenton, NP  gabapentin (NEURONTIN) 100 MG capsule Take 1 capsule (100 mg total) by mouth at bedtime as needed (sleep). 06/11/21   Jearld Fenton, NP  naproxen (NAPROSYN) 500 MG tablet TAKE 1 TABLET BY MOUTH TWICE DAILY AS NEEDED FOR MILD OR MODERATE PAIN 01/27/22   Jearld Fenton, NP  Olopatadine HCl 0.2 % SOLN APPLY 1 DROP TO EYE ONCE DAILY 07/23/21   Jearld Fenton, NP  omeprazole (PRILOSEC) 20 MG capsule TAKE 1 CAPSULE BY MOUTH ONCE DAILY 04/14/22   Jearld Fenton, NP  Pancrelipase, Lip-Prot-Amyl, (ZENPEP) 93716-96789 units CPEP TAKE 1  CAPSULE BY MOUTH 3 TIMES DAILY WITH MEALS 01/27/22   Jearld Fenton, NP  sertraline (ZOLOFT) 100 MG tablet TAKE 1 TABLET BY MOUTH ONCE DAILY *PLEASE MAKE APPOINTMENT FOR FURTHER FILLS* 04/14/22   Jearld Fenton, NP  traZODone (DESYREL) 150 MG tablet Take 1 tablet (150 mg total) by mouth at bedtime as needed for sleep. 10/20/18   Mikey College, NP  VENTOLIN HFA 108 (90 Base) MCG/ACT inhaler INHALE 1-2 PUFFS INTO THE LLUNGS EVERY 6HOURS AS NEEDED FOR WHEEZING OR SHORTNESS OF BREATH 12/31/21   Jearld Fenton, NP     Allergies  Hctz [hydrochlorothiazide]   Family History   Family History  Problem Relation  Age of Onset   Diabetes Mother    Sleep apnea Mother    Emphysema Sister    CAD Neg Hx    Mental illness Neg Hx      Physical Exam  Triage Vital Signs: ED Triage Vitals [06/01/22 0001]  Enc Vitals Group     BP (!) 171/101     Pulse Rate 83     Resp 20     Temp 98.7 F (37.1 C)     Temp Source Oral     SpO2 100 %     Weight 174 lb (78.9 kg)     Height '5\' 7"'  (1.702 m)     Head Circumference      Peak Flow      Pain Score 8     Pain Loc      Pain Edu?      Excl. in [place]?     Updated Vital Signs: BP (!) 161/91   Pulse 73   Temp 97.9 F (36.6 C) (Oral)   Resp 20   Ht '5\' 7"'  (1.702 m)   Wt 78.9 kg   SpO2 100%   BMI 27.25 kg/m    General: Awake, no distress.  CV:  RRR.  Good peripheral perfusion.  Resp:  Normal effort.  Mildly diminished, otherwise CTA B. Abd:  Mild epigastric tenderness to palpation without rebound or guarding.  No distention.  Other:  No truncal vesicles.   ED Results / Procedures / Treatments  Labs (all labs ordered are listed, but only abnormal results are displayed) Labs Reviewed  CBC - Abnormal; Notable for the following components:      Result Value   WBC 10.8 (*)    All other components within normal limits  COMPREHENSIVE METABOLIC PANEL - Abnormal; Notable for the following components:   Glucose, Bld 108 (*)    Total Protein 8.6 (*)    Anion gap 4 (*)    All other components within normal limits  TROPONIN I (HIGH SENSITIVITY) - Abnormal; Notable for the following components:   Troponin I (High Sensitivity) 29 (*)    All other components within normal limits  TROPONIN I (HIGH SENSITIVITY) - Abnormal; Notable for the following components:   Troponin I (High Sensitivity) 34 (*)    All other components within normal limits  LIPASE, BLOOD     EKG  ED ECG REPORT I, Miyah Hampshire J, the attending physician, personally viewed and interpreted this ECG.   Date: 06/01/2022  EKG Time: 0013  Rate: 97  Rhythm: normal sinus rhythm  Axis:  LAD  Intervals: Bifascicular block  ST&T Change: Nonspecific    RADIOLOGY I have independently visualized and interpreted patient's chest x-ray as well as noted the radiology interpretation:  Chest x-ray: No acute cardiopulmonary process; stable chronic ILD  Official radiology report(s): DG Chest 2 View  Result Date: 06/01/2022 CLINICAL DATA:  Shortness of breath EXAM: CHEST - 2 VIEW COMPARISON:  12/08/2021 FINDINGS: Subpleural reticulation/fibrosis in the lungs bilaterally, lower lobe predominant, suggesting chronic interstitial lung disease. No focal consolidation. The diffusion The heart is normal in size. Visualized osseous structures are within normal limits. IMPRESSION: No evidence of acute cardiopulmonary disease. Stable chronic interstitial lung disease. Electronically Signed   By: Julian Hy M.D.   On: 06/01/2022 00:26     PROCEDURES:  Critical Care performed: Yes, see critical care procedure note(s)  CRITICAL CARE Performed by: Paulette Blanch   Total critical care time: 45 minutes  Critical care time was exclusive of separately billable procedures and treating other patients.  Critical care was necessary to treat or prevent imminent or life-threatening deterioration.  Critical care was time spent personally by me on the following activities: development of treatment plan with patient and/or surrogate as well as nursing, discussions with consultants, evaluation of patient's response to treatment, examination of patient, obtaining history from patient or surrogate, ordering and performing treatments and interventions, ordering and review of laboratory studies, ordering and review of radiographic studies, pulse oximetry and re-evaluation of patient's condition.   Marland Kitchen1-3 Lead EKG Interpretation  Performed by: Paulette Blanch, MD Authorized by: Paulette Blanch, MD     Interpretation: normal     ECG rate:  85   ECG rate assessment: normal     Rhythm: sinus rhythm      Ectopy: none     Conduction: normal   Comments:     Patient placed on cardiac monitor to evaluate for arrhythmias    MEDICATIONS ORDERED IN ED: Medications  thiamine (VITAMIN B1) tablet 100 mg (has no administration in time range)  LORazepam (ATIVAN) injection 0-4 mg (has no administration in time range)  ipratropium-albuterol (DUONEB) 0.5-2.5 (3) MG/3ML nebulizer solution 3 mL (has no administration in time range)  sodium chloride 0.9 % bolus 1,000 mL (1,000 mLs Intravenous New Bag/Given 06/01/22 0547)  ipratropium-albuterol (DUONEB) 0.5-2.5 (3) MG/3ML nebulizer solution 3 mL (3 mLs Nebulization Given 06/01/22 0540)  methylPREDNISolone sodium succinate (SOLU-MEDROL) 125 mg/2 mL injection 125 mg (125 mg Intravenous Given 06/01/22 0540)  HYDROmorphone (DILAUDID) injection 1 mg (1 mg Intravenous Given 06/01/22 0543)  ondansetron (ZOFRAN) injection 4 mg (4 mg Intravenous Given 06/01/22 0542)     IMPRESSION / MDM / ASSESSMENT AND PLAN / ED COURSE  I reviewed the triage vital signs and the nursing notes.                             56 year old male presenting with shortness of breath and abdominal pain. Differential includes, but is not limited to, viral syndrome, bronchitis including COPD exacerbation, pneumonia, reactive airway disease including asthma, CHF including exacerbation with or without pulmonary/interstitial edema, pneumothorax, ACS, thoracic trauma, and pulmonary embolism.  I have personally reviewed patient's records and note he last had an admission for alcohol induced chronic pancreatitis on 12/21/2021.  Patient's presentation is most consistent with exacerbation of chronic illness.  The patient is on the cardiac monitor to evaluate for evidence of arrhythmia and/or significant heart rate changes.  Laboratory results demonstrate normal H/H 13.8/42.7, normal electrolytes including lipase, minimally elevated initial troponin.  Chest x-ray demonstrates changes of stable ILD.  Will  obtain repeat troponin.  Administer IV Solu-Medrol and DuoNeb for asthma exacerbation, start IV fluid resuscitation, IV Dilaudid for pain paired  with IV Zofran for nausea.  Will reassess.  Clinical Course as of 06/01/22 0639  Mon Jun 01, 2022  0634 RA sats 86%, placed on 2L [place] oxygen.  Aeration remains diminished my: Will administer additional DuoNeb.  Will consult hospitalist services for evaluation and admission.  Placed on CIWA. [JS]    Clinical Course User Index [JS] Paulette Blanch, MD     FINAL CLINICAL IMPRESSION(S) / ED DIAGNOSES   Final diagnoses:  Shortness of breath  ILD (interstitial lung disease) ([place])  Hypoxia  Pain of upper abdomen     Rx / DC Orders   ED Discharge Orders     None        Note:  This document was prepared using Dragon voice recognition software and may include unintentional dictation errors.   Paulette Blanch, MD 06/01/22 [phone]

## 2022-06-01 NOTE — Assessment & Plan Note (Signed)
Patient has productive cough, wheezing, 2 L new oxygen requirement.  Chest x-ray negative for infiltration.  Possibly due to ILD exacerbation and possible undiagnosed COPD with acute exacerbation.  Patient is a longtime smoker.  - will admit to tele bed as inpatient -Bronchodilators -Solu-Medrol 40 mg IV bid -Z pak  -Mucinex for cough  -Incentive spirometry -sputum culture -check Covid19  -Nasal cannula oxygen as needed to maintain O2 saturation 93% or greater

## 2022-06-01 NOTE — ED Triage Notes (Signed)
Pt states has felt shob for several days, history of asthma. Pt denies known fever, sore throat. Pt states he also believes he has pancreatitis, is having luq pain, with some nausea.

## 2022-06-01 NOTE — Assessment & Plan Note (Signed)
-   Did Counseling about importance of quitting alcohol use. -CIWA protocol

## 2022-06-01 NOTE — Assessment & Plan Note (Signed)
-   Continue home medications 

## 2022-06-01 NOTE — ED Notes (Signed)
Answered pt call bell - wanted machine to stop beeping and IV is done. Nurse notified

## 2022-06-01 NOTE — Assessment & Plan Note (Signed)
-   Patient's not taking medications currently -Follow-up with PCP

## 2022-06-01 NOTE — Assessment & Plan Note (Signed)
-   Did counseling about importance of quitting smoking -Nicotine patch

## 2022-06-01 NOTE — Assessment & Plan Note (Signed)
Patient has worsening abdominal pain, but lipase normal.  Most likely due to chronic pancreatitis. -Call pain control: As needed Dilaudid, Percocet -IV fluid: 1 L normal saline, 125 cc/h -Full liquid diet -continue Creon

## 2022-06-02 ENCOUNTER — Other Ambulatory Visit: Payer: Self-pay

## 2022-06-02 ENCOUNTER — Encounter: Payer: Self-pay | Admitting: Internal Medicine

## 2022-06-02 DIAGNOSIS — F1010 Alcohol abuse, uncomplicated: Secondary | ICD-10-CM | POA: Diagnosis not present

## 2022-06-02 DIAGNOSIS — K860 Alcohol-induced chronic pancreatitis: Secondary | ICD-10-CM | POA: Diagnosis not present

## 2022-06-02 DIAGNOSIS — F329 Major depressive disorder, single episode, unspecified: Secondary | ICD-10-CM

## 2022-06-02 DIAGNOSIS — J9601 Acute respiratory failure with hypoxia: Secondary | ICD-10-CM | POA: Diagnosis not present

## 2022-06-02 DIAGNOSIS — Z720 Tobacco use: Secondary | ICD-10-CM

## 2022-06-02 LAB — BASIC METABOLIC PANEL
Anion gap: 7 (ref 5–15)
BUN: 11 mg/dL (ref 6–20)
CO2: 24 mmol/L (ref 22–32)
Calcium: 9.2 mg/dL (ref 8.9–10.3)
Chloride: 105 mmol/L (ref 98–111)
Creatinine, Ser: 1 mg/dL (ref 0.61–1.24)
GFR, Estimated: >60 mL/min (ref >60)
Glucose, Bld: 147 mg/dL — ABNORMAL HIGH (ref 70–99)
Potassium: 4.5 mmol/L (ref 3.5–5.1)
Sodium: 136 mmol/L (ref 135–145)

## 2022-06-02 LAB — LIPID PANEL
Cholesterol: 153 mg/dL (ref 0–200)
HDL: 68 mg/dL (ref >40)
LDL Cholesterol: 77 mg/dL (ref 0–99)
Total CHOL/HDL Ratio: 2.3 RATIO
Triglycerides: 42 mg/dL (ref <150)
VLDL: 8 mg/dL (ref 0–40)

## 2022-06-02 LAB — SARS CORONAVIRUS 2 BY RT PCR: SARS Coronavirus 2 by RT PCR: NEGATIVE

## 2022-06-02 LAB — CBC
HCT: 39 % (ref 39.0–52.0)
Hemoglobin: 13.1 g/dL (ref 13.0–17.0)
MCH: 31.6 pg (ref 26.0–34.0)
MCHC: 33.6 g/dL (ref 30.0–36.0)
MCV: 94.2 fL (ref 80.0–100.0)
Platelets: 280 10*3/uL (ref 150–400)
RBC: 4.14 MIL/uL — ABNORMAL LOW (ref 4.22–5.81)
RDW: 13.1 % (ref 11.5–15.5)
WBC: 8.7 10*3/uL (ref 4.0–10.5)
nRBC: 0 % (ref 0.0–0.2)

## 2022-06-02 LAB — TROPONIN I (HIGH SENSITIVITY)
Troponin I (High Sensitivity): 18 ng/L — ABNORMAL HIGH (ref <18)
Troponin I (High Sensitivity): 19 ng/L — ABNORMAL HIGH (ref <18)

## 2022-06-02 MED ORDER — IPRATROPIUM-ALBUTEROL 0.5-2.5 (3) MG/3ML IN SOLN
3.0000 mL | Freq: Four times a day (QID) | RESPIRATORY_TRACT | Status: DC
  Administered 2022-06-03 (×3): 3 mL via RESPIRATORY_TRACT
  Filled 2022-06-02 (×3): qty 3

## 2022-06-02 MED ORDER — STERILE WATER FOR INJECTION IJ SOLN
INTRAMUSCULAR | Status: AC
  Administered 2022-06-02: 10 mL
  Filled 2022-06-02: qty 10

## 2022-06-02 NOTE — ED Notes (Signed)
Failed attempt to obtain AM labs x2.called lab for phlebotomy to assist.

## 2022-06-02 NOTE — Hospital Course (Addendum)
Noah Velasquez is a 56 y.o. male who presents to the ED with a chief complaint of shortness of breath, abdominal pain and nausea.  Patient with a history of asthma and "probably COPD my doctor says" who states he has felt short of breath with mild cough for the past several days.  Also with a history of alcohol induced pancreatitis who is having left upper quadrant abdominal pain associated with nausea.  Denies daily EtOH; states last drink several days ago.  10/16: Admitted for acute respiratory failure with hypoxia, will 2 L new oxygen requirement.  ILD versus COPD exacerbation, history of tobacco abuse.  Started on steroids, CPAP, antitussives, IS.  Also placed on full liquid diet and pain control for worsening abdominal pain likely due to chronic pancreatitis 10/17: reports SOB, on 2L [name]. (+)mild wheeze on exam.  10/18: tolerating po intake, O2 WNL on RA at rest but drops w/ ambulation to 90, pt feels comfortable to go home and asking for discharge. OK to go home w/ outpatient follow up.   Consultants:  none  Procedures: none      ASSESSMENT & PLAN:   Principal Problem:   Acute respiratory failure with hypoxia (HCC) Active Problems:   ILD (interstitial lung disease) ([place])   Chronic pancreatitis (HCC)   Essential hypertension   HLD (hyperlipidemia)   Myocardial injury   Depression   Alcohol abuse   Tobacco abuse   Acute respiratory failure with hypoxia (HCC) ILD (interstitial lung disease) ([place]) Patient has productive cough, wheezing, 2 L new oxygen requirement.   Chest x-ray negative for infiltration.   Possibly due to ILD exacerbation and possible undiagnosed COPD with acute exacerbation.   Patient is a longtime smoker. Bronchodilators --> Symbicort, albuterol rescue inhaler, pt requested nebulizer macine so this and duonebs also Rx  Solu-Medrol 40 mg IV bid --> prednisone po  Z pak --> finish outpatient  Mucinex for cough  Incentive spirometry  Chronic pancreatitis  ([place]) Patient has worsening abdominal pain, but lipase normal.  Most likely due to chronic pancreatitis. Full liquid diet to advance as tolerated  Essential hypertension Amlodipine  HLD (hyperlipidemia) not taking medications currently LDL 77 Follow-up with PCP  Myocardial injury Trend troponin --> trending down to 18 Aspirin 81 mg daily  Depression Continue home medications  Alcohol abuse Patient has been counseled about importance of quitting alcohol use. CIWA protocol  Tobacco abuse Patient has been counseled about importance of quitting smoking Nicotine patch

## 2022-06-02 NOTE — TOC Initial Note (Signed)
Transition of Care [facility]) - Initial/Assessment Note    Patient Details  Name: Noah Velasquez MRN: [id number] Date of Birth: 05/27/1966  [facility] CM/SW Contact:    Alberteen Sam, LCSW Phone Number: 06/02/2022, 10:50 AM  Clinical Narrative:                  TOC consulted for substance abuse resources. CSW met with patient at bedside he reports he drinks occasionally does not feel he needs SA resources but was agreeable to accept packet.   Reports he lives home alone, PCP Dr. Garnette Gunner. Reports he wears no O2 at home, reports he heard he may need it at dc. TOC will continue to follow for O2 needs. Reports depending on what prescriptions he is discharged with he may need assistance financially, CSW informed patient we will consult pharmacy at dc to ensure most cost effective medications can be ordered at dc. Patient reports no other needs at this time.   Expected Discharge Plan: Home/Self Care Barriers to Discharge: Continued Medical Work up   Patient Goals and CMS Choice Patient states their goals for this hospitalization and ongoing recovery are:: to go home CMS Medicare.gov Compare Post Acute Care list provided to:: Patient Choice offered to / list presented to : Patient  Expected Discharge Plan and Services Expected Discharge Plan: Home/Self Care       Living arrangements for the past 2 months: Single Family Home                                      Prior Living Arrangements/Services Living arrangements for the past 2 months: Single Family Home Lives with:: Self                   Activities of Daily Living      Permission Sought/Granted                  Emotional Assessment       Orientation: : Oriented to Self, Oriented to Place, Oriented to  Time, Oriented to Situation Alcohol / Substance Use: Not Applicable Psych Involvement: No (comment)  Admission diagnosis:  Acute respiratory failure with hypoxia ([place]) [J96.01] Patient Active  Problem List   Diagnosis Date Noted   Acute respiratory failure with hypoxia ([place]) 06/01/2022   ILD (interstitial lung disease) ([place]) 06/01/2022   Myocardial injury    Alcohol abuse    Tobacco abuse    Peripheral neuropathy 12/22/2021   Aortic atherosclerosis ([place]) 12/10/2021   Chronic lung disease 12/10/2021   Overweight with body mass index (BMI) of 28 to 28.9 in adult 09/23/2021   HLD (hyperlipidemia) 01/26/2021   GERD (gastroesophageal reflux disease) 01/26/2021   Chronic pancreatitis ([place]) 01/26/2021   Erectile dysfunction 01/10/2020   MDD (major depressive disorder), recurrent, in full remission ([place]) 06/05/2019   Alcohol use disorder, moderate, in sustained remission ([place]) 04/06/2019   Anemia 03/09/2016   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

## 2022-06-02 NOTE — Progress Notes (Signed)
PROGRESS NOTE    Noah Velasquez   HQI:[id number] DOB: 1965-09-06  DOA: 06/01/2022 Date of Service: 06/02/22 PCP: Jearld Fenton, NP     Brief Narrative / Hospital Course:  Noah Velasquez is a 57 y.o. male who presents to the ED with a chief complaint of shortness of breath, abdominal pain and nausea.  Patient with a history of asthma and "probably COPD my doctor says" who states he has felt short of breath with mild cough for the past several days.  Also with a history of alcohol induced pancreatitis who is having left upper quadrant abdominal pain associated with nausea.  Denies daily EtOH; states last drink several days ago.  10/16: Admitted for acute respiratory failure with hypoxia, will 2 L new oxygen requirement.  ILD versus COPD exacerbation, history of tobacco abuse.  Started on steroids, CPAP, antitussives, IS.  Also placed on full liquid diet and pain control for worsening abdominal pain likely due to chronic pancreatitis 10/17: remains SOB, on 2L [place]. (+)wheeze on exam.   Consultants:  none  Procedures: none      ASSESSMENT & PLAN:   Principal Problem:   Acute respiratory failure with hypoxia (HCC) Active Problems:   ILD (interstitial lung disease) ([place])   Chronic pancreatitis ([place])   Essential hypertension   HLD (hyperlipidemia)   Myocardial injury   Depression   Alcohol abuse   Tobacco abuse   Acute respiratory failure with hypoxia ([place]) ILD (interstitial lung disease) ([place]) Patient has productive cough, wheezing, 2 L new oxygen requirement.   Chest x-ray negative for infiltration.   Possibly due to ILD exacerbation and possible undiagnosed COPD with acute exacerbation.   Patient is a longtime smoker. admit to tele bed as inpatient Bronchodilators Solu-Medrol 40 mg IV bid Z pak  Mucinex for cough  Incentive spirometry sputum culture check Covid19  Nasal cannula oxygen as needed to maintain O2 saturation 93% or greater, wean down as tolerated this  may need home O2  Chronic pancreatitis ([place]) Patient has worsening abdominal pain, but lipase normal.  Most likely due to chronic pancreatitis. As needed Dilaudid, Percocet IV fluid: 1 L normal saline, 125 cc/h Full liquid diet to advance as tolerated  Essential hypertension IV hydralazine as needed Amlodipine  HLD (hyperlipidemia) not taking medications currently LDL 77 Follow-up with PCP  Myocardial injury Myocardial injury due to acute respiratory failure with hypoxia: Troponin level 29, 34.  Denies chest pain. Trend troponin --> trending down to 18 Check A1c, FLP Aspirin 81 mg daily  Depression Continue home medications  Alcohol abuse Patient has been counseled about importance of quitting alcohol use. CIWA protocol  Tobacco abuse Patient has been counseled about importance of quitting smoking Nicotine patch    DVT prophylaxis: Lovenox Pertinent IV fluids/nutrition: Normal saline 125 mL/h, reduced to 75 mL/h since patient tolerating p.o. Central lines / invasive devices: None  Code Status: Full code Family Communication: At this time  Disposition: Inpatient TOC needs: None but may need home O2 Barriers to discharge / significant pending items: Wean off O2 as able, anticipate discharge next few days             Subjective:  Patient reports still feeling short winded when he is off of oxygen, no chest pain.  Abdominal pain somewhat improved.       Objective:  Vitals:   06/02/22 0459 06/02/22 0918 06/02/22 1000 06/02/22 1300  BP: (!) 147/87 (!) 144/87 (!) 146/90 135/82  Pulse: 91 93 81 100  Resp: '18 18 16 18  '$ Temp: 98 F (36.7 C) 98.2 F (36.8 C)  98.5 F (36.9 C)  TempSrc: Oral Oral    SpO2: 96% 98% 95% 96%  Weight:      Height:        Intake/Output Summary (Last 24 hours) at 06/02/2022 1451 Last data filed at 06/02/2022 0254 Gross per 24 hour  Intake 1000 ml  Output 600 ml  Net 400 ml   Filed Weights   06/01/22 0001   Weight: 78.9 kg    Examination:  Constitutional:  VS as above General Appearance: alert, well-developed, well-nourished, NAD Respiratory: Normal respiratory effort No wheeze No rhonchi No rales Cardiovascular: S1/S2 normal No murmur No rub/gallop auscultated Gastrointestinal: No tenderness Musculoskeletal:  No clubbing/cyanosis of digits Symmetrical movement in all extremities Neurological: No cranial nerve deficit on limited exam Alert Psychiatric: Normal judgment/insight Normal mood and affect       Scheduled Medications:   amLODipine  10 mg Oral Daily   aspirin EC  81 mg Oral Daily   azithromycin  250 mg Oral Daily   buPROPion  150 mg Oral Daily   enoxaparin (LOVENOX) injection  40 mg Subcutaneous Q24H   famotidine  20 mg Oral BID   folic acid  1 mg Oral Daily   ipratropium-albuterol  3 mL Nebulization Q4H   lipase/protease/amylase  12,000 Units Oral TID with meals   LORazepam  0-4 mg Intravenous Q6H   Followed by   Derrill Memo ON 06/03/2022] LORazepam  0-4 mg Intravenous Q12H   methylPREDNISolone (SOLU-MEDROL) injection  40 mg Intravenous Q12H   multivitamin with minerals  1 tablet Oral Daily   nicotine  21 mg Transdermal Daily   olopatadine  1 drop Both Eyes Daily   pantoprazole  40 mg Oral Daily   thiamine  100 mg Oral Daily   Or   thiamine  100 mg Intravenous Daily    Continuous Infusions:  sodium chloride 125 mL/hr at 06/02/22 0916    PRN Medications:  acetaminophen, albuterol, dextromethorphan-guaiFENesin, gabapentin, hydrALAZINE, HYDROmorphone (DILAUDID) injection, LORazepam **OR** LORazepam, ondansetron (ZOFRAN) IV, oxyCODONE-acetaminophen, traZODone  Antimicrobials:  Anti-infectives (From admission, onward)    Start     Dose/Rate Route Frequency Ordered Stop   06/02/22 1000  azithromycin (ZITHROMAX) tablet 250 mg       See Hyperspace for full Linked Orders Report.   250 mg Oral Daily 06/01/22 1619 06/06/22 0959   06/01/22 1700   azithromycin (ZITHROMAX) tablet 500 mg       See Hyperspace for full Linked Orders Report.   500 mg Oral Daily 06/01/22 1619 06/01/22 1824       Data Reviewed: I have personally reviewed following labs and imaging studies  CBC: Recent Labs  Lab 06/01/22 0004 06/02/22 0626  WBC 10.8* 8.7  HGB 13.8 13.1  HCT 42.7 39.0  MCV 97.9 94.2  PLT 288 735   Basic Metabolic Panel: Recent Labs  Lab 06/01/22 0004 06/02/22 0626  NA 138 136  K 4.1 4.5  CL 109 105  CO2 25 24  GLUCOSE 108* 147*  BUN 12 11  CREATININE 1.22 1.00  CALCIUM 9.0 9.2   GFR: Estimated Creatinine Clearance: 77.1 mL/min (by C-G formula based on SCr of 1 mg/dL). Liver Function Tests: Recent Labs  Lab 06/01/22 0004  AST 20  ALT 10  ALKPHOS 93  BILITOT 0.5  PROT 8.6*  ALBUMIN 3.7   Recent Labs  Lab 06/01/22 0004  LIPASE 51   No results  for input(s): "AMMONIA" in the last 168 hours. Coagulation Profile: No results for input(s): "INR", "PROTIME" in the last 168 hours. Cardiac Enzymes: No results for input(s): "CKTOTAL", "CKMB", "CKMBINDEX", "TROPONINI" in the last 168 hours. BNP (last 3 results) No results for input(s): "PROBNP" in the last 8760 hours. HbA1C: Recent Labs    06/01/22 0005  HGBA1C 6.3*   CBG: No results for input(s): "GLUCAP" in the last 168 hours. Lipid Profile: Recent Labs    06/02/22 0626  CHOL 153  HDL 68  LDLCALC 77  TRIG 42  CHOLHDL 2.3   Thyroid Function Tests: No results for input(s): "TSH", "T4TOTAL", "FREET4", "T3FREE", "THYROIDAB" in the last 72 hours. Anemia Panel: No results for input(s): "VITAMINB12", "FOLATE", "FERRITIN", "TIBC", "IRON", "RETICCTPCT" in the last 72 hours. Urine analysis:    Component Value Date/Time   COLORURINE STRAW (A) 12/08/2021 1929   APPEARANCEUR CLEAR (A) 12/08/2021 1929   LABSPEC 1.008 12/08/2021 [place] 7.0 12/08/2021 [place] NEGATIVE 12/08/2021 [place] NEGATIVE 12/08/2021 [place] NEGATIVE  12/08/2021 1929   BILIRUBINUR Negative 10/20/2018 1443   KETONESUR 5 (A) 12/08/2021 [place] NEGATIVE 12/08/2021 1929   UROBILINOGEN 0.2 10/20/2018 1443   NITRITE NEGATIVE 12/08/2021 1929   LEUKOCYTESUR NEGATIVE 12/08/2021 1929   Sepsis Labs: '@LABRCNTIP'$ (procalcitonin:4,lacticidven:4)  Recent Results (from the past 240 hour(s))  SARS Coronavirus 2 by RT PCR (hospital order, performed in [place] hospital lab) *cepheid single result test* Anterior Nasal Swab     Status: None   Collection Time: 06/02/22 12:36 PM   Specimen: Anterior Nasal Swab  Result Value Ref Range Status   SARS Coronavirus 2 by RT PCR NEGATIVE NEGATIVE Final    Comment: (NOTE) SARS-CoV-2 target nucleic acids are NOT DETECTED.  The SARS-CoV-2 RNA is generally detectable in upper and lower respiratory specimens during the acute phase of infection. The lowest concentration of SARS-CoV-2 viral copies this assay can detect is 250 copies / mL. A negative result does not preclude SARS-CoV-2 infection and should not be used as the sole basis for treatment or other patient management decisions.  A negative result may occur with improper specimen collection / handling, submission of specimen other than nasopharyngeal swab, presence of viral mutation(s) within the areas targeted by this assay, and inadequate number of viral copies (<250 copies / mL). A negative result must be combined with clinical observations, patient history, and epidemiological information.  Fact Sheet for Patients:   https://www.patel.info/  Fact Sheet for Healthcare Providers: https://hall.com/  This test is not yet approved or  cleared by the [place] FDA and has been authorized for detection and/or diagnosis of SARS-CoV-2 by FDA under an Emergency Use Authorization (EUA).  This EUA will remain in effect (meaning this test can be used) for the duration of the COVID-19 declaration under  Section 564(b)(1) of the Act, 21 U.S.C. section 360bbb-3(b)(1), unless the authorization is terminated or revoked sooner.  Performed at [facility], [address], [place], [place] [address]          Radiology Studies: DG Chest 2 View  Result Date: 06/01/2022 CLINICAL DATA:  Shortness of breath EXAM: CHEST - 2 VIEW COMPARISON:  12/08/2021 FINDINGS: Subpleural reticulation/fibrosis in the lungs bilaterally, lower lobe predominant, suggesting chronic interstitial lung disease. No focal consolidation. The diffusion The heart is normal in size. Visualized osseous structures are within normal limits. IMPRESSION: No evidence of acute cardiopulmonary disease. Stable chronic interstitial lung disease. Electronically Signed  By: Julian Hy M.D.   On: 06/01/2022 00:26            LOS: 1 day       Emeterio Reeve, DO [facility] 06/02/2022, 2:51 PM   Staff may message me via secure chat in [place]  but this may not receive immediate response,  please page for urgent matters!  If 7PM-7AM, please contact night-coverage [email or web address]  Dictation software was used to generate the above note. Typos may occur and escape review, as with typed/written notes. Please contact Dr Sheppard Coil directly for clarity if needed.

## 2022-06-03 DIAGNOSIS — J9601 Acute respiratory failure with hypoxia: Secondary | ICD-10-CM | POA: Diagnosis not present

## 2022-06-03 LAB — GLUCOSE, CAPILLARY: Glucose-Capillary: 134 mg/dL — ABNORMAL HIGH (ref 70–99)

## 2022-06-03 MED ORDER — NAPROXEN 375 MG PO TABS: 375.0000 mg | ORAL_TABLET | Freq: Two times a day (BID) | ORAL | Status: DC | PRN

## 2022-06-03 MED ORDER — NICOTINE 21 MG/24HR TD PT24: 21.0000 mg | MEDICATED_PATCH | Freq: Every day | TRANSDERMAL | 0 refills | Status: DC

## 2022-06-03 MED ORDER — OMEPRAZOLE 20 MG PO CPDR: 20.0000 mg | DELAYED_RELEASE_CAPSULE | Freq: Every day | ORAL | 0 refills | Status: DC

## 2022-06-03 MED ORDER — TRAZODONE HCL 150 MG PO TABS: 150.0000 mg | ORAL_TABLET | Freq: Every evening | ORAL | 0 refills | Status: DC | PRN

## 2022-06-03 MED ORDER — IPRATROPIUM-ALBUTEROL 0.5-2.5 (3) MG/3ML IN SOLN: 3.0000 mL | RESPIRATORY_TRACT | 1 refills | Status: DC | PRN

## 2022-06-03 MED ORDER — ASPIRIN 81 MG PO TBEC: 81.0000 mg | DELAYED_RELEASE_TABLET | Freq: Every day | ORAL | 12 refills | Status: DC

## 2022-06-03 MED ORDER — SERTRALINE HCL 50 MG PO TABS
100.0000 mg | ORAL_TABLET | Freq: Every day | ORAL | Status: DC
  Administered 2022-06-03: 100 mg via ORAL
  Filled 2022-06-03: qty 2

## 2022-06-03 MED ORDER — BUPROPION HCL ER (XL) 150 MG PO TB24: 150.0000 mg | ORAL_TABLET | Freq: Every day | ORAL | 0 refills | Status: DC

## 2022-06-03 MED ORDER — COMPRESSOR/NEBULIZER MISC: 0 refills | Status: AC

## 2022-06-03 MED ORDER — DM-GUAIFENESIN ER 30-600 MG PO TB12: 1.0000 | ORAL_TABLET | Freq: Two times a day (BID) | ORAL | Status: DC | PRN

## 2022-06-03 MED ORDER — AZITHROMYCIN 250 MG PO TABS: 250.0000 mg | ORAL_TABLET | Freq: Every day | ORAL | 0 refills | Status: AC

## 2022-06-03 MED ORDER — AMLODIPINE BESYLATE 10 MG PO TABS: 10.0000 mg | ORAL_TABLET | Freq: Every day | ORAL | 0 refills | Status: DC

## 2022-06-03 MED ORDER — FAMOTIDINE 20 MG PO TABS: 20.0000 mg | ORAL_TABLET | Freq: Every day | ORAL | 0 refills | Status: DC

## 2022-06-03 MED ORDER — PREDNISONE 50 MG PO TABS: 50.0000 mg | ORAL_TABLET | Freq: Every day | ORAL | Status: DC

## 2022-06-03 MED ORDER — BLOOD PRESSURE KIT: PACK | 0 refills | Status: DC

## 2022-06-03 MED ORDER — BUDESONIDE-FORMOTEROL FUMARATE 160-4.5 MCG/ACT IN AERO: 2.0000 | INHALATION_SPRAY | Freq: Two times a day (BID) | RESPIRATORY_TRACT | 0 refills | Status: DC

## 2022-06-03 MED ORDER — ZENPEP 15000-47000 UNITS PO CPEP: 1.0000 | ORAL_CAPSULE | Freq: Three times a day (TID) | ORAL | 0 refills | Status: AC

## 2022-06-03 MED ORDER — SERTRALINE HCL 100 MG PO TABS: 100.0000 mg | ORAL_TABLET | Freq: Every day | ORAL | 0 refills | Status: DC

## 2022-06-03 MED ORDER — PREDNISONE 10 MG PO TABS: ORAL_TABLET | ORAL | 0 refills | Status: AC

## 2022-06-03 MED ORDER — VENTOLIN HFA 108 (90 BASE) MCG/ACT IN AERS: INHALATION_SPRAY | RESPIRATORY_TRACT | 0 refills | Status: DC

## 2022-06-03 NOTE — TOC Transition Note (Signed)
Transition of Care [facility]) - CM/SW Discharge Note   Patient Details  Name: Noah Velasquez MRN: [id number] Date of Birth: Dec 27, 1965  [facility]) CM/SW Contact:  Colen Darling, [place] Phone Number: 06/03/2022, 2:22 PM   Clinical Narrative:       Final next level of care: Home/Self Care Barriers to Discharge: Barriers Resolved   Patient Goals and CMS Choice Patient states their goals for this hospitalization and ongoing recovery are:: return home CMS Medicare.gov Compare Post Acute Care list provided to:: Patient Choice offered to / list presented to : Patient  Discharge Placement                    Patient and family notified of of transfer: 06/03/22  Discharge Plan and Services                       Home              Social Determinants of Health (SDOH) Interventions     Readmission Risk Interventions     No data to display

## 2022-06-03 NOTE — Progress Notes (Signed)
Patient being discharged home, via [employer] from voucher. IV removed and discharge instructions given, all questions answered.

## 2022-06-03 NOTE — Discharge Summary (Signed)
Physician Discharge Summary   Patient: Noah Velasquez MRN: [id number]  DOB: Jan 13, 1966   Admit:     Date of Admission: 06/01/2022 Admitted from: home   Discharge: Date of discharge: 06/03/22 Disposition: Home Condition at discharge: good  CODE STATUS: FULL CODE     Discharge Physician: Emeterio Reeve, DO [facility]     PCP: Jearld Fenton, NP  Recommendations for Outpatient Follow-up:  Follow up with PCP Jearld Fenton, NP in 1-2 weeks Please obtain labs/tests: CBC, CMP in 1-2 weeks Please follow up on the following pending results: none Please follow up on medication adherence  Patient may benefit from PFT, suspect COPD    Discharge Instructions     Diet - low sodium heart healthy   Complete by: As directed    Increase activity slowly   Complete by: As directed          Discharge Diagnoses: Principal Problem:   Acute respiratory failure with hypoxia ([place]) Active Problems:   ILD (interstitial lung disease) ([place])   Chronic pancreatitis ([place])   Essential hypertension   HLD (hyperlipidemia)   Myocardial injury   Depression   Alcohol abuse   Tobacco abuse       Hospital Course: Noah Velasquez is a 56 y.o. male who presents to the ED with a chief complaint of shortness of breath, abdominal pain and nausea.  Patient with a history of asthma and "probably COPD my doctor says" who states he has felt short of breath with mild cough for the past several days.  Also with a history of alcohol induced pancreatitis who is having left upper quadrant abdominal pain associated with nausea.  Denies daily EtOH; states last drink several days ago.  10/16: Admitted for acute respiratory failure with hypoxia, will 2 L new oxygen requirement.  ILD versus COPD exacerbation, history of tobacco abuse.  Started on steroids, CPAP, antitussives, IS.  Also placed on full liquid diet and pain control for worsening abdominal pain likely due to chronic  pancreatitis 10/17: reports SOB, on 2L [place]. (+)mild wheeze on exam.  10/18: tolerating po intake, O2 WNL on RA at rest but drops w/ ambulation to 90, pt feels comfortable to go home and asking for discharge. OK to go home w/ outpatient follow up.   Consultants:  none  Procedures: none      ASSESSMENT & PLAN:   Principal Problem:   Acute respiratory failure with hypoxia ([facility]) Active Problems:   ILD (interstitial lung disease) ([place])   Chronic pancreatitis ([facility])   Essential hypertension   HLD (hyperlipidemia)   Myocardial injury   Depression   Alcohol abuse   Tobacco abuse   Acute respiratory failure with hypoxia ([facility]) ILD (interstitial lung disease) ([place]) Patient has productive cough, wheezing, 2 L new oxygen requirement.   Chest x-ray negative for infiltration.   Possibly due to ILD exacerbation and possible undiagnosed COPD with acute exacerbation.   Patient is a longtime smoker. Bronchodilators --> Symbicort, albuterol rescue inhaler, pt requested nebulizer macine so this and duonebs also Rx  Solu-Medrol 40 mg IV bid --> prednisone po  Z pak --> finish outpatient  Mucinex for cough  Incentive spirometry  Chronic pancreatitis ([place]) Patient has worsening abdominal pain, but lipase normal.  Most likely due to chronic pancreatitis. Full liquid diet to advance as tolerated  Essential hypertension Amlodipine  HLD (hyperlipidemia) not taking medications currently LDL 77 Follow-up with PCP  Myocardial injury Trend troponin --> trending down to 18  Aspirin 81 mg daily  Depression Continue home medications  Alcohol abuse Patient has been counseled about importance of quitting alcohol use. CIWA protocol  Tobacco abuse Patient has been counseled about importance of quitting smoking Nicotine patch            Discharge Instructions  Allergies as of 06/03/2022       Reactions   Hctz [hydrochlorothiazide] Swelling        Medication List      STOP taking these medications    fluticasone 50 MCG/ACT nasal spray Commonly known as: FLONASE   gabapentin 100 MG capsule Commonly known as: NEURONTIN   Olopatadine HCl 0.2 % Soln       TAKE these medications    amLODipine 10 MG tablet Commonly known as: NORVASC Take 1 tablet (10 mg total) by mouth daily.   aspirin EC 81 MG tablet Take 1 tablet (81 mg total) by mouth daily. Swallow whole. Start taking on: June 04, 2022   azithromycin 250 MG tablet Commonly known as: ZITHROMAX Take 1 tablet (250 mg total) by mouth daily for 2 days. Start taking on: June 04, 2022   Blood Pressure Kit Please allow patient to chose from machines that are covered under insurance   budesonide-formoterol 160-4.5 MCG/ACT inhaler Commonly known as: SYMBICORT Inhale 2 puffs into the lungs 2 (two) times daily.   buPROPion 150 MG 24 hr tablet Commonly known as: WELLBUTRIN XL Take 1 tablet (150 mg total) by mouth daily.   Compressor/Nebulizer Misc Nebulizer and tubes/mask to use as directed with DuoNeb as needed   dextromethorphan-guaiFENesin 30-600 MG 12hr tablet Commonly known as: MUCINEX DM Take 1 tablet by mouth 2 (two) times daily as needed for cough.   famotidine 20 MG tablet Commonly known as: PEPCID Take 1 tablet (20 mg total) by mouth daily. What changed: when to take this   ipratropium-albuterol 0.5-2.5 (3) MG/3ML Soln Commonly known as: DUONEB Take 3 mLs by nebulization every 2 (two) hours as needed (wheeze, SOB). Can use instead of albuterol inhaler   naproxen 375 MG tablet Commonly known as: NAPROSYN Take 1 tablet (375 mg total) by mouth 2 (two) times daily as needed for moderate pain or mild pain. What changed:  medication strength See the new instructions.   nicotine 21 mg/24hr patch Commonly known as: NICODERM CQ - dosed in mg/24 hours Place 1 patch (21 mg total) onto the skin daily. Start taking on: June 04, 2022   omeprazole 20 MG capsule Commonly  known as: PRILOSEC Take 1 capsule (20 mg total) by mouth daily.   predniSONE 10 MG tablet Commonly known as: DELTASONE Take 4 tablets (40 mg total) by mouth daily for 1 day, THEN 3 tablets (30 mg total) daily for 1 day, THEN 2 tablets (20 mg total) daily for 1 day, THEN 1 tablet (10 mg total) daily for 1 day. Start taking on: June 04, 2022   sertraline 100 MG tablet Commonly known as: ZOLOFT Take 1 tablet (100 mg total) by mouth daily. What changed: See the new instructions.   traZODone 150 MG tablet Commonly known as: DESYREL Take 1 tablet (150 mg total) by mouth at bedtime as needed for sleep.   Ventolin HFA 108 (90 Base) MCG/ACT inhaler Generic drug: albuterol INHALE 1-2 PUFFS INTO THE LLUNGS EVERY 6HOURS AS NEEDED FOR WHEEZING OR SHORTNESS OF BREATH   Zenpep 15000-47000 units Cpep Generic drug: Pancrelipase (Lip-Prot-Amyl) Take 1 capsule by mouth 3 (three) times daily before meals. What changed:  how much to take how to take this when to take this additional instructions          Allergies  Allergen Reactions   Hctz [Hydrochlorothiazide] Swelling     Subjective: PT states no concners, denies CP/SOB   Discharge Exam: BP 133/84   Pulse 96   Temp 97.9 F (36.6 C) (Oral)   Resp 18   Ht '5\' 7"'  (1.702 m)   Wt 78.9 kg   SpO2 96%   BMI 27.25 kg/m  General: Pt is alert, awake, not in acute distress Cardiovascular: RRR, S1/S2 +, no rubs, no gallops Respiratory: diminished breath sounds all fields, no wheezing, no rhonchi Abdominal: Soft, NT, ND, bowel sounds + Extremities: no edema, no cyanosis     The results of significant diagnostics from this hospitalization (including imaging, microbiology, ancillary and laboratory) are listed below for reference.     Microbiology: Recent Results (from the past 240 hour(s))  SARS Coronavirus 2 by RT PCR (hospital order, performed in [facility] hospital lab) *cepheid single result test* Anterior Nasal Swab      Status: None   Collection Time: 06/02/22 12:36 PM   Specimen: Anterior Nasal Swab  Result Value Ref Range Status   SARS Coronavirus 2 by RT PCR NEGATIVE NEGATIVE Final    Comment: (NOTE) SARS-CoV-2 target nucleic acids are NOT DETECTED.  The SARS-CoV-2 RNA is generally detectable in upper and lower respiratory specimens during the acute phase of infection. The lowest concentration of SARS-CoV-2 viral copies this assay can detect is 250 copies / mL. A negative result does not preclude SARS-CoV-2 infection and should not be used as the sole basis for treatment or other patient management decisions.  A negative result may occur with improper specimen collection / handling, submission of specimen other than nasopharyngeal swab, presence of viral mutation(s) within the areas targeted by this assay, and inadequate number of viral copies (<250 copies / mL). A negative result must be combined with clinical observations, patient history, and epidemiological information.  Fact Sheet for Patients:   https://www.patel.info/  Fact Sheet for Healthcare Providers: https://hall.com/  This test is not yet approved or  cleared by the [place] FDA and has been authorized for detection and/or diagnosis of SARS-CoV-2 by FDA under an Emergency Use Authorization (EUA).  This EUA will remain in effect (meaning this test can be used) for the duration of the COVID-19 declaration under Section 564(b)(1) of the Act, 21 U.S.C. section 360bbb-3(b)(1), unless the authorization is terminated or revoked sooner.  Performed at [facility], [address] [place], [place] [address]      Labs: BNP (last 3 results) No results for input(s): "BNP" in the last 8760 hours. Basic Metabolic Panel: Recent Labs  Lab 06/01/22 0004 06/02/22 0626  NA 138 136  K 4.1 4.5  CL 109 105  CO2 25 24  GLUCOSE 108* 147*  BUN 12 11  CREATININE 1.22 1.00  CALCIUM  9.0 9.2   Liver Function Tests: Recent Labs  Lab 06/01/22 0004  AST 20  ALT 10  ALKPHOS 93  BILITOT 0.5  PROT 8.6*  ALBUMIN 3.7   Recent Labs  Lab 06/01/22 0004  LIPASE 51   No results for input(s): "AMMONIA" in the last 168 hours. CBC: Recent Labs  Lab 06/01/22 0004 06/02/22 0626  WBC 10.8* 8.7  HGB 13.8 13.1  HCT 42.7 39.0  MCV 97.9 94.2  PLT 288 280   Cardiac Enzymes: No results for input(s): "CKTOTAL", "CKMB", "CKMBINDEX", "  TROPONINI" in the last 168 hours. BNP: Invalid input(s): "POCBNP" CBG: Recent Labs  Lab 06/03/22 0802  GLUCAP 134*   D-Dimer No results for input(s): "DDIMER" in the last 72 hours. Hgb A1c Recent Labs    06/01/22 0005  HGBA1C 6.3*   Lipid Profile Recent Labs    06/02/22 0626  CHOL 153  HDL 68  LDLCALC 77  TRIG 42  CHOLHDL 2.3   Thyroid function studies No results for input(s): "TSH", "T4TOTAL", "T3FREE", "THYROIDAB" in the last 72 hours.  Invalid input(s): "FREET3" Anemia work up No results for input(s): "VITAMINB12", "FOLATE", "FERRITIN", "TIBC", "IRON", "RETICCTPCT" in the last 72 hours. Urinalysis    Component Value Date/Time   COLORURINE STRAW (A) 12/08/2021 1929   APPEARANCEUR CLEAR (A) 12/08/2021 1929   LABSPEC 1.008 12/08/2021 1929   PHURINE 7.0 12/08/2021 1929   GLUCOSEU NEGATIVE 12/08/2021 [place] NEGATIVE 12/08/2021 1929   BILIRUBINUR NEGATIVE 12/08/2021 1929   BILIRUBINUR Negative 10/20/2018 1443   KETONESUR 5 (A) 12/08/2021 1929   PROTEINUR NEGATIVE 12/08/2021 1929   UROBILINOGEN 0.2 10/20/2018 1443   NITRITE NEGATIVE 12/08/2021 1929   LEUKOCYTESUR NEGATIVE 12/08/2021 1929   Sepsis Labs Recent Labs  Lab 06/01/22 0004 06/02/22 0626  WBC 10.8* 8.7   Microbiology Recent Results (from the past 240 hour(s))  SARS Coronavirus 2 by RT PCR (hospital order, performed in [place] hospital lab) *cepheid single result test* Anterior Nasal Swab     Status: None   Collection Time: 06/02/22 12:36  PM   Specimen: Anterior Nasal Swab  Result Value Ref Range Status   SARS Coronavirus 2 by RT PCR NEGATIVE NEGATIVE Final    Comment: (NOTE) SARS-CoV-2 target nucleic acids are NOT DETECTED.  The SARS-CoV-2 RNA is generally detectable in upper and lower respiratory specimens during the acute phase of infection. The lowest concentration of SARS-CoV-2 viral copies this assay can detect is 250 copies / mL. A negative result does not preclude SARS-CoV-2 infection and should not be used as the sole basis for treatment or other patient management decisions.  A negative result may occur with improper specimen collection / handling, submission of specimen other than nasopharyngeal swab, presence of viral mutation(s) within the areas targeted by this assay, and inadequate number of viral copies (<250 copies / mL). A negative result must be combined with clinical observations, patient history, and epidemiological information.  Fact Sheet for Patients:   https://www.patel.info/  Fact Sheet for Healthcare Providers: https://hall.com/  This test is not yet approved or  cleared by the [place] FDA and has been authorized for detection and/or diagnosis of SARS-CoV-2 by FDA under an Emergency Use Authorization (EUA).  This EUA will remain in effect (meaning this test can be used) for the duration of the COVID-19 declaration under Section 564(b)(1) of the Act, 21 U.S.C. section 360bbb-3(b)(1), unless the authorization is terminated or revoked sooner.  Performed at [facility], [address] [place], [place] [address]    Imaging DG Chest 2 View  Result Date: 06/01/2022 CLINICAL DATA:  Shortness of breath EXAM: CHEST - 2 VIEW COMPARISON:  12/08/2021 FINDINGS: Subpleural reticulation/fibrosis in the lungs bilaterally, lower lobe predominant, suggesting chronic interstitial lung disease. No focal consolidation. The diffusion The heart is  normal in size. Visualized osseous structures are within normal limits. IMPRESSION: No evidence of acute cardiopulmonary disease. Stable chronic interstitial lung disease. Electronically Signed   By: Julian Hy M.D.   On: 06/01/2022 00:26      Time  coordinating discharge: over 30 minutes  SIGNED:  Emeterio Reeve DO [facility]

## 2022-06-03 NOTE — Progress Notes (Signed)
PROGRESS NOTE    Noah Velasquez   ZDG:[id number] DOB: March 17, 1966  DOA: 06/01/2022 Date of Service: 06/03/22 PCP: Jearld Fenton, NP     Brief Narrative / Hospital Course:  Noah Velasquez is a 56 y.o. male who presents to the ED with a chief complaint of shortness of breath, abdominal pain and nausea.  Patient with a history of asthma and "probably COPD my doctor says" who states he has felt short of breath with mild cough for the past several days.  Also with a history of alcohol induced pancreatitis who is having left upper quadrant abdominal pain associated with nausea.  Denies daily EtOH; states last drink several days ago.  10/16: Admitted for acute respiratory failure with hypoxia, will 2 L new oxygen requirement.  ILD versus COPD exacerbation, history of tobacco abuse.  Started on steroids, CPAP, antitussives, IS.  Also placed on full liquid diet and pain control for worsening abdominal pain likely due to chronic pancreatitis 10/17: reports SOB, on 2L [place]. (+)mild wheeze on exam.  10/18: tolerating po intake, O2 WNL on RA at rest but drops w/ ambulation, will keep through tonight and repeat walk test tomorrow, may need to arrange home O2   Consultants:  none  Procedures: none      ASSESSMENT & PLAN:   Principal Problem:   Acute respiratory failure with hypoxia ([place]) Active Problems:   ILD (interstitial lung disease) ([place])   Chronic pancreatitis ([place])   Essential hypertension   HLD (hyperlipidemia)   Myocardial injury   Depression   Alcohol abuse   Tobacco abuse   Acute respiratory failure with hypoxia ([place]) ILD (interstitial lung disease) ([place]) Patient has productive cough, wheezing, 2 L new oxygen requirement.   Chest x-ray negative for infiltration.   Possibly due to ILD exacerbation and possible undiagnosed COPD with acute exacerbation.   Patient is a longtime smoker. Bronchodilators --> Symbicort, albuterol rescue inhaler, pt requested nebulizer macine so  this and duonebs also Rx  Solu-Medrol 40 mg IV bid --> prednisone po  Z pak --> finish outpatient  Mucinex for cough  Incentive spirometry O2 supplementation   Chronic pancreatitis ([place]) Patient has worsening abdominal pain, but lipase normal.  Most likely due to chronic pancreatitis. Full liquid diet to advance as tolerated  Essential hypertension Amlodipine  HLD (hyperlipidemia) not taking medications currently LDL 77 Follow-up with PCP  Myocardial injury Trend troponin --> trending down to 18 Aspirin 81 mg daily  Depression Continue home medications  Alcohol abuse Patient has been counseled about importance of quitting alcohol use. CIWA protocol  Tobacco abuse Patient has been counseled about importance of quitting smoking Nicotine patch      DVT prophylaxis: Lovenox Pertinent IV fluids/nutrition: Normal saline 125 mL/h, reduced to 75 mL/h then d/c today since patient tolerating p.o.  Central lines / invasive devices: None   Code Status: Full code Family Communication: none at this time   Disposition: Inpatient TOC needs: None but may need home O2 Barriers to discharge / significant pending items: Wean off O2 as able, anticipate discharge tomorrow on RA or on home O2           Subjective:  Patient reports still feeling short winded when he is off of oxygen, no chest pain.  Abdominal pain improved, tolerating clears        Objective:  Vitals:   06/03/22 0752 06/03/22 0805 06/03/22 0922 06/03/22 1145  BP:  (!) 162/97 133/84   Pulse:  88 89 96  Resp:  18    Temp:  97.9 F (36.6 C)    TempSrc:  Oral    SpO2: 99% 96%  97%  Weight:      Height:        Intake/Output Summary (Last 24 hours) at 06/03/2022 1304 Last data filed at 06/03/2022 1257 Gross per 24 hour  Intake --  Output 750 ml  Net -750 ml   Filed Weights   06/01/22 0001  Weight: 78.9 kg    Examination:  Constitutional:  VS as above General Appearance: alert,  well-developed, well-nourished, NAD Respiratory: Diminished breath sounds bilaterally  No wheeze No rhonchi No rales Cardiovascular: S1/S2 normal No murmur No rub/gallop auscultated Gastrointestinal: No tenderness Musculoskeletal:  No clubbing/cyanosis of digits Symmetrical movement in all extremities Neurological: No cranial nerve deficit on limited exam Alert Psychiatric: Normal judgment/insight Normal mood and affect       Scheduled Medications:   amLODipine  10 mg Oral Daily   aspirin EC  81 mg Oral Daily   azithromycin  250 mg Oral Daily   buPROPion  150 mg Oral Daily   enoxaparin (LOVENOX) injection  40 mg Subcutaneous Q24H   famotidine  20 mg Oral BID   folic acid  1 mg Oral Daily   ipratropium-albuterol  3 mL Nebulization Q6H   lipase/protease/amylase  12,000 Units Oral TID with meals   LORazepam  0-4 mg Intravenous Q12H   methylPREDNISolone (SOLU-MEDROL) injection  40 mg Intravenous Q12H   multivitamin with minerals  1 tablet Oral Daily   nicotine  21 mg Transdermal Daily   olopatadine  1 drop Both Eyes Daily   pantoprazole  40 mg Oral Daily   sertraline  100 mg Oral Daily   thiamine  100 mg Oral Daily   Or   thiamine  100 mg Intravenous Daily    Continuous Infusions:  sodium chloride Stopped (06/03/22 1259)    PRN Medications:  acetaminophen, albuterol, dextromethorphan-guaiFENesin, gabapentin, hydrALAZINE, HYDROmorphone (DILAUDID) injection, LORazepam **OR** LORazepam, ondansetron (ZOFRAN) IV, oxyCODONE-acetaminophen, traZODone  Antimicrobials:  Anti-infectives (From admission, onward)    Start     Dose/Rate Route Frequency Ordered Stop   06/04/22 0000  azithromycin (ZITHROMAX) 250 MG tablet        250 mg Oral Daily 06/03/22 1227 06/06/22 2359   06/02/22 1000  azithromycin (ZITHROMAX) tablet 250 mg       See Hyperspace for full Linked Orders Report.   250 mg Oral Daily 06/01/22 1619 06/06/22 0959   06/01/22 1700  azithromycin (ZITHROMAX)  tablet 500 mg       See Hyperspace for full Linked Orders Report.   500 mg Oral Daily 06/01/22 1619 06/01/22 1824       Data Reviewed: I have personally reviewed following labs and imaging studies  CBC: Recent Labs  Lab 06/01/22 0004 06/02/22 0626  WBC 10.8* 8.7  HGB 13.8 13.1  HCT 42.7 39.0  MCV 97.9 94.2  PLT 288 403   Basic Metabolic Panel: Recent Labs  Lab 06/01/22 0004 06/02/22 0626  NA 138 136  K 4.1 4.5  CL 109 105  CO2 25 24  GLUCOSE 108* 147*  BUN 12 11  CREATININE 1.22 1.00  CALCIUM 9.0 9.2   GFR: Estimated Creatinine Clearance: 77.1 mL/min (by C-G formula based on SCr of 1 mg/dL). Liver Function Tests: Recent Labs  Lab 06/01/22 0004  AST 20  ALT 10  ALKPHOS 93  BILITOT 0.5  PROT 8.6*  ALBUMIN 3.7   Recent Labs  Lab 06/01/22 0004  LIPASE 51   No results for input(s): "AMMONIA" in the last 168 hours. Coagulation Profile: No results for input(s): "INR", "PROTIME" in the last 168 hours. Cardiac Enzymes: No results for input(s): "CKTOTAL", "CKMB", "CKMBINDEX", "TROPONINI" in the last 168 hours. BNP (last 3 results) No results for input(s): "PROBNP" in the last 8760 hours. HbA1C: Recent Labs    06/01/22 0005  HGBA1C 6.3*   CBG: Recent Labs  Lab 06/03/22 0802  GLUCAP 134*   Lipid Profile: Recent Labs    06/02/22 0626  CHOL 153  HDL 68  LDLCALC 77  TRIG 42  CHOLHDL 2.3   Thyroid Function Tests: No results for input(s): "TSH", "T4TOTAL", "FREET4", "T3FREE", "THYROIDAB" in the last 72 hours. Anemia Panel: No results for input(s): "VITAMINB12", "FOLATE", "FERRITIN", "TIBC", "IRON", "RETICCTPCT" in the last 72 hours. Urine analysis:    Component Value Date/Time   COLORURINE STRAW (A) 12/08/2021 1929   APPEARANCEUR CLEAR (A) 12/08/2021 1929   LABSPEC 1.008 12/08/2021 [place] 7.0 12/08/2021 [place] NEGATIVE 12/08/2021 [place] NEGATIVE 12/08/2021 [place] NEGATIVE 12/08/2021 1929   BILIRUBINUR  Negative 10/20/2018 1443   KETONESUR 5 (A) 12/08/2021 [place] NEGATIVE 12/08/2021 1929   UROBILINOGEN 0.2 10/20/2018 1443   NITRITE NEGATIVE 12/08/2021 1929   LEUKOCYTESUR NEGATIVE 12/08/2021 1929   Sepsis Labs: '@LABRCNTIP'$ (procalcitonin:4,lacticidven:4)  Recent Results (from the past 240 hour(s))  SARS Coronavirus 2 by RT PCR (hospital order, performed in [place] hospital lab) *cepheid single result test* Anterior Nasal Swab     Status: None   Collection Time: 06/02/22 12:36 PM   Specimen: Anterior Nasal Swab  Result Value Ref Range Status   SARS Coronavirus 2 by RT PCR NEGATIVE NEGATIVE Final    Comment: (NOTE) SARS-CoV-2 target nucleic acids are NOT DETECTED.  The SARS-CoV-2 RNA is generally detectable in upper and lower respiratory specimens during the acute phase of infection. The lowest concentration of SARS-CoV-2 viral copies this assay can detect is 250 copies / mL. A negative result does not preclude SARS-CoV-2 infection and should not be used as the sole basis for treatment or other patient management decisions.  A negative result may occur with improper specimen collection / handling, submission of specimen other than nasopharyngeal swab, presence of viral mutation(s) within the areas targeted by this assay, and inadequate number of viral copies (<250 copies / mL). A negative result must be combined with clinical observations, patient history, and epidemiological information.  Fact Sheet for Patients:   https://www.patel.info/  Fact Sheet for Healthcare Providers: https://hall.com/  This test is not yet approved or  cleared by the [place] FDA and has been authorized for detection and/or diagnosis of SARS-CoV-2 by FDA under an Emergency Use Authorization (EUA).  This EUA will remain in effect (meaning this test can be used) for the duration of the COVID-19 declaration under Section 564(b)(1) of the Act, 21  U.S.C. section 360bbb-3(b)(1), unless the authorization is terminated or revoked sooner.  Performed at [facility], [address] [place], [place] [address]          Radiology Studies: DG Chest 2 View  Result Date: 06/01/2022 CLINICAL DATA:  Shortness of breath EXAM: CHEST - 2 VIEW COMPARISON:  12/08/2021 FINDINGS: Subpleural reticulation/fibrosis in the lungs bilaterally, lower lobe predominant, suggesting chronic interstitial lung disease. No focal consolidation. The diffusion The heart is normal in size. Visualized osseous structures are within normal limits. IMPRESSION: No evidence  of acute cardiopulmonary disease. Stable chronic interstitial lung disease. Electronically Signed   By: Julian Hy M.D.   On: 06/01/2022 00:26            LOS: 2 days       Emeterio Reeve, DO [facility] 06/03/2022, 1:04 PM   Staff may message me via secure chat in [place]  but this may not receive immediate response,  please page for urgent matters!  If 7PM-7AM, please contact night-coverage [email or web address]  Dictation software was used to generate the above note. Typos may occur and escape review, as with typed/written notes. Please contact Dr Sheppard Coil directly for clarity if needed.

## 2022-06-03 NOTE — Discharge Summary (Deleted)
Physician Discharge Summary   Patient: Noah Velasquez MRN: [id number]  DOB: May 07, 1966   Admit:     Date of Admission: 06/01/2022 Admitted from: home   Discharge: Date of discharge: 06/03/22 Disposition: Home Condition at discharge: good  CODE STATUS: FULL CODE     Discharge Physician: Emeterio Reeve, DO [facility]     PCP: Jearld Fenton, NP  Recommendations for Outpatient Follow-up:  Follow up with PCP Jearld Fenton, NP in 1-2 weeks Please obtain labs/tests: CBC, CMP in 1-2 weeks Please follow up on the following pending results: none Please follow up on medication adherence  Patient may benefit from PFT, suspect COPD    Discharge Instructions     Diet - low sodium heart healthy   Complete by: As directed    Increase activity slowly   Complete by: As directed          Discharge Diagnoses: Principal Problem:   Acute respiratory failure with hypoxia ([place]) Active Problems:   ILD (interstitial lung disease) ([place])   Chronic pancreatitis ([place])   Essential hypertension   HLD (hyperlipidemia)   Myocardial injury   Depression   Alcohol abuse   Tobacco abuse       Hospital Course: [name] is a 56 y.o. male who presents to the ED with a chief complaint of shortness of breath, abdominal pain and nausea.  Patient with a history of asthma and "probably COPD my doctor says" who states he has felt short of breath with mild cough for the past several days.  Also with a history of alcohol induced pancreatitis who is having left upper quadrant abdominal pain associated with nausea.  Denies daily EtOH; states last drink several days ago.  10/16: Admitted for acute respiratory failure with hypoxia, will 2 L new oxygen requirement.  ILD versus COPD exacerbation, history of tobacco abuse.  Started on steroids, CPAP, antitussives, IS.  Also placed on full liquid diet and pain control for worsening abdominal pain likely due to chronic  pancreatitis 10/17: reports SOB, on 2L [place]. (+)mild wheeze on exam.  10/18: tolerating po intake, O2 WNL on RA, stable for discharge w/ po medications and advance diet as tolerated, follow outpatient   Consultants:  none  Procedures: none      ASSESSMENT & PLAN:   Principal Problem:   Acute respiratory failure with hypoxia ([place]) Active Problems:   ILD (interstitial lung disease) ([place])   Chronic pancreatitis ([place])   Essential hypertension   HLD (hyperlipidemia)   Myocardial injury   Depression   Alcohol abuse   Tobacco abuse   Acute respiratory failure with hypoxia ([place]) ILD (interstitial lung disease) ([place]) Patient has productive cough, wheezing, 2 L new oxygen requirement.   Chest x-ray negative for infiltration.   Possibly due to ILD exacerbation and possible undiagnosed COPD with acute exacerbation.   Patient is a longtime smoker. Bronchodilators --> Symbicort, albuterol rescue inhaler, pt requested nebulizer macine so this and duonebs also Rx  Solu-Medrol 40 mg IV bid --> prednisone po  Z pak --> finish outpatient  Mucinex for cough  Incentive spirometry  Chronic pancreatitis ([place]) Patient has worsening abdominal pain, but lipase normal.  Most likely due to chronic pancreatitis. Full liquid diet to advance as tolerated  Essential hypertension Amlodipine  HLD (hyperlipidemia) not taking medications currently LDL 77 Follow-up with PCP  Myocardial injury Trend troponin --> trending down to 18 Aspirin 81 mg daily  Depression Continue home medications  Alcohol abuse Patient  has been counseled about importance of quitting alcohol use. CIWA protocol  Tobacco abuse Patient has been counseled about importance of quitting smoking Nicotine patch           Discharge Instructions  Allergies as of 06/03/2022       Reactions   Hctz [hydrochlorothiazide] Swelling        Medication List     STOP taking these medications    fluticasone 50  MCG/ACT nasal spray Commonly known as: FLONASE   gabapentin 100 MG capsule Commonly known as: NEURONTIN   Olopatadine HCl 0.2 % Soln       TAKE these medications    amLODipine 10 MG tablet Commonly known as: NORVASC Take 1 tablet (10 mg total) by mouth daily.   aspirin EC 81 MG tablet Take 1 tablet (81 mg total) by mouth daily. Swallow whole. Start taking on: June 04, 2022   azithromycin 250 MG tablet Commonly known as: ZITHROMAX Take 1 tablet (250 mg total) by mouth daily for 2 days. Start taking on: June 04, 2022   Blood Pressure Kit Please allow patient to chose from machines that are covered under insurance   budesonide-formoterol 160-4.5 MCG/ACT inhaler Commonly known as: SYMBICORT Inhale 2 puffs into the lungs 2 (two) times daily.   buPROPion 150 MG 24 hr tablet Commonly known as: WELLBUTRIN XL Take 1 tablet (150 mg total) by mouth daily.   Compressor/Nebulizer Misc Nebulizer and tubes/mask to use as directed with DuoNeb as needed   dextromethorphan-guaiFENesin 30-600 MG 12hr tablet Commonly known as: MUCINEX DM Take 1 tablet by mouth 2 (two) times daily as needed for cough.   famotidine 20 MG tablet Commonly known as: PEPCID Take 1 tablet (20 mg total) by mouth daily. What changed: when to take this   ipratropium-albuterol 0.5-2.5 (3) MG/3ML Soln Commonly known as: DUONEB Take 3 mLs by nebulization every 2 (two) hours as needed (wheeze, SOB). Can use instead of albuterol inhaler   naproxen 375 MG tablet Commonly known as: NAPROSYN Take 1 tablet (375 mg total) by mouth 2 (two) times daily as needed for moderate pain or mild pain. What changed:  medication strength See the new instructions.   nicotine 21 mg/24hr patch Commonly known as: NICODERM CQ - dosed in mg/24 hours Place 1 patch (21 mg total) onto the skin daily. Start taking on: June 04, 2022   omeprazole 20 MG capsule Commonly known as: PRILOSEC Take 1 capsule (20 mg total) by  mouth daily.   predniSONE 10 MG tablet Commonly known as: DELTASONE Take 4 tablets (40 mg total) by mouth daily for 1 day, THEN 3 tablets (30 mg total) daily for 1 day, THEN 2 tablets (20 mg total) daily for 1 day, THEN 1 tablet (10 mg total) daily for 1 day. Start taking on: June 04, 2022   sertraline 100 MG tablet Commonly known as: ZOLOFT Take 1 tablet (100 mg total) by mouth daily. What changed: See the new instructions.   traZODone 150 MG tablet Commonly known as: DESYREL Take 1 tablet (150 mg total) by mouth at bedtime as needed for sleep.   Ventolin HFA 108 (90 Base) MCG/ACT inhaler Generic drug: albuterol INHALE 1-2 PUFFS INTO THE LLUNGS EVERY 6HOURS AS NEEDED FOR WHEEZING OR SHORTNESS OF BREATH   Zenpep 15000-47000 units Cpep Generic drug: Pancrelipase (Lip-Prot-Amyl) Take 1 capsule by mouth 3 (three) times daily before meals. What changed:  how much to take how to take this when to take this additional instructions  Allergies  Allergen Reactions   Hctz [Hydrochlorothiazide] Swelling     Subjective: PT states no concners, denies CP/SOB   Discharge Exam: BP 133/84   Pulse 96   Temp 97.9 F (36.6 C) (Oral)   Resp 18   Ht '5\' 7"'  (1.702 m)   Wt 78.9 kg   SpO2 97%   BMI 27.25 kg/m  General: Pt is alert, awake, not in acute distress Cardiovascular: RRR, S1/S2 +, no rubs, no gallops Respiratory: diminished breath sounds all fields, no wheezing, no rhonchi Abdominal: Soft, NT, ND, bowel sounds + Extremities: no edema, no cyanosis     The results of significant diagnostics from this hospitalization (including imaging, microbiology, ancillary and laboratory) are listed below for reference.     Microbiology: Recent Results (from the past 240 hour(s))  SARS Coronavirus 2 by RT PCR (hospital order, performed in [facility] hospital lab) *cepheid single result test* Anterior Nasal Swab     Status: None   Collection Time: 06/02/22 12:36 PM    Specimen: Anterior Nasal Swab  Result Value Ref Range Status   SARS Coronavirus 2 by RT PCR NEGATIVE NEGATIVE Final    Comment: (NOTE) SARS-CoV-2 target nucleic acids are NOT DETECTED.  The SARS-CoV-2 RNA is generally detectable in upper and lower respiratory specimens during the acute phase of infection. The lowest concentration of SARS-CoV-2 viral copies this assay can detect is 250 copies / mL. A negative result does not preclude SARS-CoV-2 infection and should not be used as the sole basis for treatment or other patient management decisions.  A negative result may occur with improper specimen collection / handling, submission of specimen other than nasopharyngeal swab, presence of viral mutation(s) within the areas targeted by this assay, and inadequate number of viral copies (<250 copies / mL). A negative result must be combined with clinical observations, patient history, and epidemiological information.  Fact Sheet for Patients:   https://www.patel.info/  Fact Sheet for Healthcare Providers: https://hall.com/  This test is not yet approved or  cleared by the [place] FDA and has been authorized for detection and/or diagnosis of SARS-CoV-2 by FDA under an Emergency Use Authorization (EUA).  This EUA will remain in effect (meaning this test can be used) for the duration of the COVID-19 declaration under Section 564(b)(1) of the Act, 21 U.S.C. section 360bbb-3(b)(1), unless the authorization is terminated or revoked sooner.  Performed at [facility], [address] [place], [place] [address]      Labs: BNP (last 3 results) No results for input(s): "BNP" in the last 8760 hours. Basic Metabolic Panel: Recent Labs  Lab 06/01/22 0004 06/02/22 0626  NA 138 136  K 4.1 4.5  CL 109 105  CO2 25 24  GLUCOSE 108* 147*  BUN 12 11  CREATININE 1.22 1.00  CALCIUM 9.0 9.2   Liver Function Tests: Recent Labs  Lab  06/01/22 0004  AST 20  ALT 10  ALKPHOS 93  BILITOT 0.5  PROT 8.6*  ALBUMIN 3.7   Recent Labs  Lab 06/01/22 0004  LIPASE 51   No results for input(s): "AMMONIA" in the last 168 hours. CBC: Recent Labs  Lab 06/01/22 0004 06/02/22 0626  WBC 10.8* 8.7  HGB 13.8 13.1  HCT 42.7 39.0  MCV 97.9 94.2  PLT 288 280   Cardiac Enzymes: No results for input(s): "CKTOTAL", "CKMB", "CKMBINDEX", "TROPONINI" in the last 168 hours. BNP: Invalid input(s): "POCBNP" CBG: Recent Labs  Lab 06/03/22 0802  GLUCAP 134*   D-Dimer  No results for input(s): "DDIMER" in the last 72 hours. Hgb A1c Recent Labs    06/01/22 0005  HGBA1C 6.3*   Lipid Profile Recent Labs    06/02/22 0626  CHOL 153  HDL 68  LDLCALC 77  TRIG 42  CHOLHDL 2.3   Thyroid function studies No results for input(s): "TSH", "T4TOTAL", "T3FREE", "THYROIDAB" in the last 72 hours.  Invalid input(s): "FREET3" Anemia work up No results for input(s): "VITAMINB12", "FOLATE", "FERRITIN", "TIBC", "IRON", "RETICCTPCT" in the last 72 hours. Urinalysis    Component Value Date/Time   COLORURINE STRAW (A) 12/08/2021 1929   APPEARANCEUR CLEAR (A) 12/08/2021 1929   LABSPEC 1.008 12/08/2021 1929   PHURINE 7.0 12/08/2021 1929   GLUCOSEU NEGATIVE 12/08/2021 [place] NEGATIVE 12/08/2021 1929   BILIRUBINUR NEGATIVE 12/08/2021 1929   BILIRUBINUR Negative 10/20/2018 1443   KETONESUR 5 (A) 12/08/2021 1929   PROTEINUR NEGATIVE 12/08/2021 1929   UROBILINOGEN 0.2 10/20/2018 1443   NITRITE NEGATIVE 12/08/2021 1929   LEUKOCYTESUR NEGATIVE 12/08/2021 1929   Sepsis Labs Recent Labs  Lab 06/01/22 0004 06/02/22 0626  WBC 10.8* 8.7   Microbiology Recent Results (from the past 240 hour(s))  SARS Coronavirus 2 by RT PCR (hospital order, performed in [facility] hospital lab) *cepheid single result test* Anterior Nasal Swab     Status: None   Collection Time: 06/02/22 12:36 PM   Specimen: Anterior Nasal Swab  Result Value Ref  Range Status   SARS Coronavirus 2 by RT PCR NEGATIVE NEGATIVE Final    Comment: (NOTE) SARS-CoV-2 target nucleic acids are NOT DETECTED.  The SARS-CoV-2 RNA is generally detectable in upper and lower respiratory specimens during the acute phase of infection. The lowest concentration of SARS-CoV-2 viral copies this assay can detect is 250 copies / mL. A negative result does not preclude SARS-CoV-2 infection and should not be used as the sole basis for treatment or other patient management decisions.  A negative result may occur with improper specimen collection / handling, submission of specimen other than nasopharyngeal swab, presence of viral mutation(s) within the areas targeted by this assay, and inadequate number of viral copies (<250 copies / mL). A negative result must be combined with clinical observations, patient history, and epidemiological information.  Fact Sheet for Patients:   https://www.patel.info/  Fact Sheet for Healthcare Providers: https://hall.com/  This test is not yet approved or  cleared by the [place] FDA and has been authorized for detection and/or diagnosis of SARS-CoV-2 by FDA under an Emergency Use Authorization (EUA).  This EUA will remain in effect (meaning this test can be used) for the duration of the COVID-19 declaration under Section 564(b)(1) of the Act, 21 U.S.C. section 360bbb-3(b)(1), unless the authorization is terminated or revoked sooner.  Performed at [facility], [address] [place], [place] [address]    Imaging DG Chest 2 View  Result Date: 06/01/2022 CLINICAL DATA:  Shortness of breath EXAM: CHEST - 2 VIEW COMPARISON:  12/08/2021 FINDINGS: Subpleural reticulation/fibrosis in the lungs bilaterally, lower lobe predominant, suggesting chronic interstitial lung disease. No focal consolidation. The diffusion The heart is normal in size. Visualized osseous structures are  within normal limits. IMPRESSION: No evidence of acute cardiopulmonary disease. Stable chronic interstitial lung disease. Electronically Signed   By: Julian Hy M.D.   On: 06/01/2022 00:26      Time coordinating discharge: over 30 minutes  SIGNED:  Emeterio Reeve DO [facility]

## 2022-06-04 ENCOUNTER — Telehealth: Payer: Self-pay | Admitting: *Deleted

## 2022-06-04 DIAGNOSIS — M069 Rheumatoid arthritis, unspecified: Secondary | ICD-10-CM | POA: Diagnosis not present

## 2022-06-04 NOTE — Patient Outreach (Signed)
Care Coordination  06/04/2022  Greg Cratty [birth year] [id number]   Transition Care Management Unsuccessful Follow-up Telephone Call  Date of discharge and from where:  06/03/22 from [facility]  Attempts:  1st Attempt  Reason for unsuccessful TCM follow-up call:  No answer/busy   Lurena Joiner RN, [place] RN Care Coordinator

## 2022-06-05 ENCOUNTER — Telehealth: Payer: Self-pay | Admitting: *Deleted

## 2022-06-05 ENCOUNTER — Telehealth: Payer: Self-pay

## 2022-06-05 DIAGNOSIS — M069 Rheumatoid arthritis, unspecified: Secondary | ICD-10-CM | POA: Diagnosis not present

## 2022-06-05 NOTE — Patient Outreach (Signed)
Care Coordination  06/05/2022  Noah Velasquez Oct 28, 1965 [id number]   Transition Care Management Unsuccessful Follow-up Telephone Call  Date of discharge and from where:  06/03/22 from [facility]  Attempts:  2nd Attempt  Reason for unsuccessful TCM follow-up call:  Unable to leave message   Lurena Joiner RN, [place] RN Care Coordinator

## 2022-06-05 NOTE — Telephone Encounter (Signed)
Transition Care Management Unsuccessful Follow-up Telephone Call  Date of discharge and from where:  [facility] 06/03/22  Attempts:  1st Attempt  Reason for unsuccessful TCM follow-up call:  Left voice message   To call back and to make appt

## 2022-06-08 DIAGNOSIS — M069 Rheumatoid arthritis, unspecified: Secondary | ICD-10-CM | POA: Diagnosis not present

## 2022-06-09 DIAGNOSIS — M069 Rheumatoid arthritis, unspecified: Secondary | ICD-10-CM | POA: Diagnosis not present

## 2022-06-10 DIAGNOSIS — M069 Rheumatoid arthritis, unspecified: Secondary | ICD-10-CM | POA: Diagnosis not present

## 2022-06-11 DIAGNOSIS — M069 Rheumatoid arthritis, unspecified: Secondary | ICD-10-CM | POA: Diagnosis not present

## 2022-06-12 DIAGNOSIS — M069 Rheumatoid arthritis, unspecified: Secondary | ICD-10-CM | POA: Diagnosis not present

## 2022-06-15 DIAGNOSIS — M069 Rheumatoid arthritis, unspecified: Secondary | ICD-10-CM | POA: Diagnosis not present

## 2022-06-16 DIAGNOSIS — M069 Rheumatoid arthritis, unspecified: Secondary | ICD-10-CM | POA: Diagnosis not present

## 2022-06-17 DIAGNOSIS — M069 Rheumatoid arthritis, unspecified: Secondary | ICD-10-CM | POA: Diagnosis not present

## 2022-06-18 DIAGNOSIS — M069 Rheumatoid arthritis, unspecified: Secondary | ICD-10-CM | POA: Diagnosis not present

## 2022-06-19 DIAGNOSIS — M069 Rheumatoid arthritis, unspecified: Secondary | ICD-10-CM | POA: Diagnosis not present

## 2022-06-22 DIAGNOSIS — M069 Rheumatoid arthritis, unspecified: Secondary | ICD-10-CM | POA: Diagnosis not present

## 2022-06-23 DIAGNOSIS — M069 Rheumatoid arthritis, unspecified: Secondary | ICD-10-CM | POA: Diagnosis not present

## 2022-06-24 DIAGNOSIS — M069 Rheumatoid arthritis, unspecified: Secondary | ICD-10-CM | POA: Diagnosis not present

## 2022-06-25 DIAGNOSIS — M069 Rheumatoid arthritis, unspecified: Secondary | ICD-10-CM | POA: Diagnosis not present

## 2022-06-26 DIAGNOSIS — M069 Rheumatoid arthritis, unspecified: Secondary | ICD-10-CM | POA: Diagnosis not present

## 2022-06-29 DIAGNOSIS — M069 Rheumatoid arthritis, unspecified: Secondary | ICD-10-CM | POA: Diagnosis not present

## 2022-06-30 DIAGNOSIS — M069 Rheumatoid arthritis, unspecified: Secondary | ICD-10-CM | POA: Diagnosis not present

## 2022-07-01 DIAGNOSIS — M069 Rheumatoid arthritis, unspecified: Secondary | ICD-10-CM | POA: Diagnosis not present

## 2022-07-03 ENCOUNTER — Other Ambulatory Visit (HOSPITAL_BASED_OUTPATIENT_CLINIC_OR_DEPARTMENT_OTHER): Payer: Self-pay | Admitting: Osteopathic Medicine

## 2022-07-03 ENCOUNTER — Other Ambulatory Visit: Payer: Self-pay | Admitting: Internal Medicine

## 2022-07-03 NOTE — Telephone Encounter (Signed)
Requested medications are due for refill today.  yes  Requested medications are on the active medications list.  yes  Last refill. 06/03/2022 #30 0 rf for both  Future visit scheduled.   yes  Notes to clinic.  Rx's signed by  Emeterio Reeve    Requested Prescriptions  Pending Prescriptions Disp Refills   sertraline (ZOLOFT) 100 MG tablet [Pharmacy Med Name: SERTRALINE HCL 100 MG TAB] 30 tablet 0    Sig: TAKE 1 TABLET BY MOUTH ONCE DAILY *PLEASE MAKE APPOINTMENT FOR FURTHER FILLS*     Psychiatry:  Antidepressants - SSRI - sertraline Failed - 07/03/2022 12:41 PM      Failed - Valid encounter within last 6 months    Recent Outpatient Visits           6 months ago 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[facility])   [facility], Coralie Keens, NP   6 months ago LUQ pain   [facility] [place], Coralie Keens, NP   9 months ago Shortness of breath   [facility], Coralie Keens, NP   1 year ago [facility], [place] W, NP   1 year ago Posttraumatic stress disorder   [facility], Coralie Keens, NP       Future Appointments             In 1 week [place], Coralie Keens, NP [facility], [place] - AST in normal range and within 360 days    AST  Date Value Ref Range Status  06/01/2022 20 15 - 41 U/L Final         Passed - ALT in normal range and within 360 days    ALT  Date Value Ref Range Status  06/01/2022 10 0 - 44 U/L Final         Passed - Completed PHQ-2 or PHQ-9 in the last 360 days       amLODipine (NORVASC) 10 MG tablet [Pharmacy Med Name: AMLODIPINE BESYLATE 10 MG TAB] 30 tablet 0    Sig: TAKE 1 TABLET BY MOUTH ONCE DAILY     Cardiovascular: Calcium Channel Blockers 2 Failed - 07/03/2022 12:41 PM      Failed - Valid encounter within last 6 months    Recent Outpatient Visits           6 months ago 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[facility])    [facility], Coralie Keens, NP   6 months ago LUQ pain   [facility] [place], Coralie Keens, NP   9 months ago Shortness of breath   [facility] [place], Coralie Keens, NP   1 year ago [facility] [place] [facility] [place], Coralie Keens, NP   1 year ago Posttraumatic stress disorder   [facility] [place], Coralie Keens, NP       Future Appointments             In 1 week Garnette Gunner, Coralie Keens, NP [place] BP in normal range    BP Readings from Last 1 Encounters:  06/03/22 133/84         Passed - Last Heart Rate in normal range    Pulse Readings from Last 1 Encounters:  06/03/22 96           

## 2022-07-14 ENCOUNTER — Encounter: Payer: Medicaid Other | Admitting: Internal Medicine

## 2022-07-17 DIAGNOSIS — M069 Rheumatoid arthritis, unspecified: Secondary | ICD-10-CM | POA: Diagnosis not present

## 2022-07-21 ENCOUNTER — Encounter: Payer: Medicaid Other | Admitting: Internal Medicine

## 2022-07-21 DIAGNOSIS — R7303 Prediabetes: Secondary | ICD-10-CM | POA: Insufficient documentation

## 2022-07-21 NOTE — Progress Notes (Deleted)
Subjective:    Patient ID: Noah Velasquez, male    DOB: [birth year], 56 y.o.   MRN: [id number]  HPI  Patient presents to clinic today for his annual exam.  Flu: 05/2021 Tetanus: 07/2018 Pneumovax: 03/2018 COVID: Never Shingrix: 02/2019, 12/2021 PSA screening: 05/2020 Colon screening: Vision screening: Dentist:  Diet: Exercise:  Review of Systems     Past Medical History:  Diagnosis Date   Chronic hip pain    Chronic pancreatitis ([place])    CKD (chronic kidney disease) stage 3, GFR 30-59 ml/min (HCC)    GERD (gastroesophageal reflux disease)    Hyperlipidemia    Hypertension    Osteoarthritis    Pancreatitis    Pancreatitis    PTSD (post-traumatic stress disorder)    Viral infection of left eye     Current Outpatient Medications  Medication Sig Dispense Refill   amLODipine (NORVASC) 10 MG tablet TAKE 1 TABLET BY MOUTH ONCE DAILY 30 tablet 0   aspirin EC 81 MG tablet Take 1 tablet (81 mg total) by mouth daily. Swallow whole. 30 tablet 12   Blood Pressure KIT Please allow patient to chose from machines that are covered under insurance 1 kit 0   budesonide-formoterol (SYMBICORT) 160-4.5 MCG/ACT inhaler Inhale 2 puffs into the lungs 2 (two) times daily. 1 each 0   buPROPion (WELLBUTRIN XL) 150 MG 24 hr tablet Take 1 tablet (150 mg total) by mouth daily. 30 tablet 0   dextromethorphan-guaiFENesin (MUCINEX DM) 30-600 MG 12hr tablet Take 1 tablet by mouth 2 (two) times daily as needed for cough.     famotidine (PEPCID) 20 MG tablet Take 1 tablet (20 mg total) by mouth daily. 30 tablet 0   ipratropium-albuterol (DUONEB) 0.5-2.5 (3) MG/3ML SOLN Take 3 mLs by nebulization every 2 (two) hours as needed (wheeze, SOB). Can use instead of albuterol inhaler 60 mL 1   naproxen (NAPROSYN) 375 MG tablet Take 1 tablet (375 mg total) by mouth 2 (two) times daily as needed for moderate pain or mild pain.     Nebulizers (COMPRESSOR/NEBULIZER) MISC Nebulizer and tubes/mask to use as directed  with DuoNeb as needed 1 each 0   nicotine (NICODERM CQ - DOSED IN MG/24 HOURS) 21 mg/24hr patch Place 1 patch (21 mg total) onto the skin daily. 28 patch 0   omeprazole (PRILOSEC) 20 MG capsule Take 1 capsule (20 mg total) by mouth daily. 30 capsule 0   sertraline (ZOLOFT) 100 MG tablet TAKE 1 TABLET BY MOUTH ONCE DAILY *PLEASE MAKE APPOINTMENT FOR FURTHER FILLS* 30 tablet 0   traZODone (DESYREL) 150 MG tablet Take 1 tablet (150 mg total) by mouth at bedtime as needed for sleep. 30 tablet 0   VENTOLIN HFA 108 (90 Base) MCG/ACT inhaler INHALE 1-2 PUFFS INTO THE LLUNGS EVERY 6HOURS AS NEEDED FOR WHEEZING OR SHORTNESS OF BREATH 18 g 0   No current facility-administered medications for this visit.    Allergies  Allergen Reactions   Hctz [Hydrochlorothiazide] Swelling    Family History  Problem Relation Age of Onset   Diabetes Mother    Sleep apnea Mother    Emphysema Sister    CAD Neg Hx    Mental illness Neg Hx     Social History   Socioeconomic History   Marital status: Legally Separated    Spouse name: Not on file   Number of children: 2   Years of education: Not on file   Highest education level: High school graduate  Occupational History  Occupation: on disability  Tobacco Use   Smoking status: Every Day    Packs/day: 0.25    Years: 15.00    Total pack years: 3.75    Types: Cigarettes   Smokeless tobacco: Never  Vaping Use   Vaping Use: Never used  Substance and Sexual Activity   Alcohol use: Yes    Alcohol/week: 7.0 standard drinks of alcohol    Types: 7 Cans of beer per week    Comment: weekly   Drug use: Yes    Types: Marijuana   Sexual activity: Yes    Birth control/protection: Condom  Other Topics Concern   Not on file  Social History Narrative   Not on file   Social Determinants of Health   Financial Resource Strain: Not on file  Food Insecurity: No Food Insecurity (06/02/2022)   Hunger Vital Sign    Worried About Running Out of Food in the Last  Year: Never true    Ran Out of Food in the Last Year: Never true  Transportation Needs: No Transportation Needs (06/02/2022)   PRAPARE - Hydrologist (Medical): No    Lack of Transportation (Non-Medical): No  Physical Activity: Not on file  Stress: Not on file  Social Connections: Not on file  Intimate Partner Violence: Not At Risk (06/02/2022)   Humiliation, Afraid, Rape, and Kick questionnaire    Fear of Current or Ex-Partner: No    Emotionally Abused: No    Physically Abused: No    Sexually Abused: No     Constitutional: Denies fever, malaise, fatigue, headache or abrupt weight changes.  HEENT: Denies eye pain, eye redness, ear pain, ringing in the ears, wax buildup, runny nose, nasal congestion, bloody nose, or sore throat. Respiratory: Denies difficulty breathing, shortness of breath, cough or sputum production.   Cardiovascular: Denies chest pain, chest tightness, palpitations or swelling in the hands or feet.  Gastrointestinal: Denies abdominal pain, bloating, constipation, diarrhea or blood in the stool.  GU: Patient reports erectile dysfunction.  Denies urgency, frequency, pain with urination, burning sensation, blood in urine, odor or discharge. Musculoskeletal: Patient reports joint pain.  Denies decrease in range of motion, difficulty with gait, muscle pain or joint swelling.  Skin: Denies redness, rashes, lesions or ulcercations.  Neurological: Patient reports peripheral neuropathy, insomnia.  Denies dizziness, difficulty with memory, difficulty with speech or problems with balance and coordination.  Psych: Patient has a history of anxiety and depression.  Denies SI/HI.  No other specific complaints in a complete review of systems (except as listed in HPI above).  Objective:   Physical Exam  There were no vitals taken for this visit. Wt Readings from Last 3 Encounters:  06/01/22 174 lb (78.9 kg)  12/31/21 170 lb (77.1 kg)  12/10/21 169 lb  (76.7 kg)    General: Appears their stated age, well developed, well nourished in NAD. Skin: Warm, dry and intact. No rashes, lesions or ulcerations noted. HEENT: Head: normal shape and size; Eyes: sclera white, no icterus, conjunctiva pink, PERRLA and EOMs intact; Ears: Tm's gray and intact, normal light reflex; Nose: mucosa pink and moist, septum midline; Throat/Mouth: Teeth present, mucosa pink and moist, no exudate, lesions or ulcerations noted.  Neck:  Neck supple, trachea midline. No masses, lumps or thyromegaly present.  Cardiovascular: Normal rate and rhythm. S1,S2 noted.  No murmur, rubs or gallops noted. No JVD or BLE edema. No carotid bruits noted. Pulmonary/Chest: Normal effort and positive vesicular breath sounds. No respiratory  distress. No wheezes, rales or ronchi noted.  Abdomen: Soft and nontender. Normal bowel sounds. No distention or masses noted. Liver, spleen and kidneys non palpable. Musculoskeletal: Normal range of motion. No signs of joint swelling. No difficulty with gait.  Neurological: Alert and oriented. Cranial nerves II-XII grossly intact. Coordination normal.  Psychiatric: Mood and affect normal. Behavior is normal. Judgment and thought content normal.    BMET    Component Value Date/Time   NA 136 06/02/2022 0626   K 4.5 06/02/2022 0626   CL 105 06/02/2022 0626   CO2 24 06/02/2022 0626   GLUCOSE 147 (H) 06/02/2022 0626   BUN 11 06/02/2022 0626   CREATININE 1.00 06/02/2022 0626   CREATININE 1.20 12/10/2021 0956   CALCIUM 9.2 06/02/2022 0626   GFRNONAA >60 06/02/2022 0626   GFRNONAA 64 06/06/2020 1141   GFRAA 74 06/06/2020 1141    Lipid Panel     Component Value Date/Time   CHOL 153 06/02/2022 0626   TRIG 42 06/02/2022 0626   HDL 68 06/02/2022 0626   CHOLHDL 2.3 06/02/2022 0626   VLDL 8 06/02/2022 0626   LDLCALC 77 06/02/2022 0626   LDLCALC 88 06/06/2020 1141    CBC    Component Value Date/Time   WBC 8.7 06/02/2022 0626   RBC 4.14 (L)  06/02/2022 0626   HGB 13.1 06/02/2022 0626   HCT 39.0 06/02/2022 0626   PLT 280 06/02/2022 0626   MCV 94.2 06/02/2022 0626   MCH 31.6 06/02/2022 0626   MCHC 33.6 06/02/2022 0626   RDW 13.1 06/02/2022 0626   LYMPHSABS 2.8 12/21/2021 2111   MONOABS 0.6 12/21/2021 2111   EOSABS 0.1 12/21/2021 2111   BASOSABS 0.1 12/21/2021 2111    Hgb A1C Lab Results  Component Value Date   HGBA1C 6.3 (H) 06/01/2022            Assessment & Plan:   Preventative Health Maintenance:  Flu shot today Tetanus UTD Encouraged him to get his COVID-vaccine Pneumovax UTD Shingrix UTD Referral to GI for screening colonoscopy Encouraged him to consume a balanced diet and exercise regimen Advised him to see an eye doctor and dentist annually We will check CBC, c-Met, lipid, A1c and hep C today  RTC in 6 months, follow-up chronic conditions .mecrd

## 2022-07-27 DIAGNOSIS — M069 Rheumatoid arthritis, unspecified: Secondary | ICD-10-CM | POA: Diagnosis not present

## 2022-07-28 DIAGNOSIS — M069 Rheumatoid arthritis, unspecified: Secondary | ICD-10-CM | POA: Diagnosis not present

## 2022-07-29 DIAGNOSIS — M069 Rheumatoid arthritis, unspecified: Secondary | ICD-10-CM | POA: Diagnosis not present

## 2022-07-30 DIAGNOSIS — M069 Rheumatoid arthritis, unspecified: Secondary | ICD-10-CM | POA: Diagnosis not present

## 2022-07-31 ENCOUNTER — Other Ambulatory Visit: Payer: Self-pay

## 2022-07-31 ENCOUNTER — Observation Stay
Admission: EM | Admit: 2022-07-31 | Discharge: 2022-08-01 | Disposition: A | Discharge status: Home / Self Care | Payer: Medicaid Other | Attending: Internal Medicine | Admitting: Internal Medicine

## 2022-07-31 ENCOUNTER — Emergency Department: Payer: Medicaid Other

## 2022-07-31 ENCOUNTER — Encounter: Payer: Self-pay | Admitting: Internal Medicine

## 2022-07-31 DIAGNOSIS — N183 Chronic kidney disease, stage 3 unspecified: Secondary | ICD-10-CM | POA: Insufficient documentation

## 2022-07-31 DIAGNOSIS — I129 Hypertensive chronic kidney disease with stage 1 through stage 4 chronic kidney disease, or unspecified chronic kidney disease: Secondary | ICD-10-CM | POA: Insufficient documentation

## 2022-07-31 DIAGNOSIS — R0602 Shortness of breath: Secondary | ICD-10-CM | POA: Diagnosis not present

## 2022-07-31 DIAGNOSIS — F32A Depression, unspecified: Secondary | ICD-10-CM | POA: Diagnosis not present

## 2022-07-31 DIAGNOSIS — Z7982 Long term (current) use of aspirin: Secondary | ICD-10-CM | POA: Diagnosis not present

## 2022-07-31 DIAGNOSIS — Z79899 Other long term (current) drug therapy: Secondary | ICD-10-CM | POA: Diagnosis not present

## 2022-07-31 DIAGNOSIS — R7989 Other specified abnormal findings of blood chemistry: Secondary | ICD-10-CM | POA: Insufficient documentation

## 2022-07-31 DIAGNOSIS — R079 Chest pain, unspecified: Secondary | ICD-10-CM

## 2022-07-31 DIAGNOSIS — F419 Anxiety disorder, unspecified: Secondary | ICD-10-CM | POA: Diagnosis present

## 2022-07-31 DIAGNOSIS — M069 Rheumatoid arthritis, unspecified: Secondary | ICD-10-CM | POA: Diagnosis not present

## 2022-07-31 DIAGNOSIS — J849 Interstitial pulmonary disease, unspecified: Secondary | ICD-10-CM | POA: Diagnosis present

## 2022-07-31 DIAGNOSIS — Z7951 Long term (current) use of inhaled steroids: Secondary | ICD-10-CM | POA: Diagnosis not present

## 2022-07-31 DIAGNOSIS — K219 Gastro-esophageal reflux disease without esophagitis: Secondary | ICD-10-CM | POA: Diagnosis not present

## 2022-07-31 DIAGNOSIS — K860 Alcohol-induced chronic pancreatitis: Secondary | ICD-10-CM

## 2022-07-31 DIAGNOSIS — R9431 Abnormal electrocardiogram [ECG] [EKG]: Secondary | ICD-10-CM | POA: Insufficient documentation

## 2022-07-31 DIAGNOSIS — Z1152 Encounter for screening for COVID-19: Secondary | ICD-10-CM | POA: Diagnosis not present

## 2022-07-31 DIAGNOSIS — F17210 Nicotine dependence, cigarettes, uncomplicated: Secondary | ICD-10-CM | POA: Diagnosis not present

## 2022-07-31 DIAGNOSIS — R0789 Other chest pain: Secondary | ICD-10-CM | POA: Diagnosis not present

## 2022-07-31 DIAGNOSIS — F5104 Psychophysiologic insomnia: Secondary | ICD-10-CM

## 2022-07-31 DIAGNOSIS — H0520 Unspecified exophthalmos: Secondary | ICD-10-CM

## 2022-07-31 DIAGNOSIS — J449 Chronic obstructive pulmonary disease, unspecified: Secondary | ICD-10-CM | POA: Diagnosis not present

## 2022-07-31 DIAGNOSIS — I160 Hypertensive urgency: Secondary | ICD-10-CM | POA: Diagnosis present

## 2022-07-31 DIAGNOSIS — F4310 Post-traumatic stress disorder, unspecified: Secondary | ICD-10-CM

## 2022-07-31 DIAGNOSIS — K861 Other chronic pancreatitis: Secondary | ICD-10-CM | POA: Diagnosis present

## 2022-07-31 LAB — BASIC METABOLIC PANEL
Anion gap: 7 (ref 5–15)
BUN: 16 mg/dL (ref 6–20)
CO2: 24 mmol/L (ref 22–32)
Calcium: 8.9 mg/dL (ref 8.9–10.3)
Chloride: 105 mmol/L (ref 98–111)
Creatinine, Ser: 1.15 mg/dL (ref 0.61–1.24)
GFR, Estimated: >60 mL/min (ref >60)
Glucose, Bld: 136 mg/dL — ABNORMAL HIGH (ref 70–99)
Potassium: 3.6 mmol/L (ref 3.5–5.1)
Sodium: 136 mmol/L (ref 135–145)

## 2022-07-31 LAB — RESP PANEL BY RT-PCR (RSV, FLU A&B, COVID)  RVPGX2
Influenza A by PCR: NEGATIVE
Influenza B by PCR: NEGATIVE
Resp Syncytial Virus by PCR: NEGATIVE
SARS Coronavirus 2 by RT PCR: NEGATIVE

## 2022-07-31 LAB — HEPATIC FUNCTION PANEL
ALT: 11 U/L (ref 0–44)
AST: 21 U/L (ref 15–41)
Albumin: 3.7 g/dL (ref 3.5–5.0)
Alkaline Phosphatase: 85 U/L (ref 38–126)
Bilirubin, Direct: <0.1 mg/dL (ref 0.0–0.2)
Total Bilirubin: 0.7 mg/dL (ref 0.3–1.2)
Total Protein: 7.9 g/dL (ref 6.5–8.1)

## 2022-07-31 LAB — CBC
HCT: 44.2 % (ref 39.0–52.0)
Hemoglobin: 15 g/dL (ref 13.0–17.0)
MCH: 32.5 pg (ref 26.0–34.0)
MCHC: 33.9 g/dL (ref 30.0–36.0)
MCV: 95.7 fL (ref 80.0–100.0)
Platelets: 327 10*3/uL (ref 150–400)
RBC: 4.62 MIL/uL (ref 4.22–5.81)
RDW: 13.2 % (ref 11.5–15.5)
WBC: 10.3 10*3/uL (ref 4.0–10.5)
nRBC: 0 % (ref 0.0–0.2)

## 2022-07-31 LAB — BRAIN NATRIURETIC PEPTIDE: B Natriuretic Peptide: 459.2 pg/mL — ABNORMAL HIGH (ref 0.0–100.0)

## 2022-07-31 LAB — TROPONIN I (HIGH SENSITIVITY)
Troponin I (High Sensitivity): 42 ng/L — ABNORMAL HIGH (ref <18)
Troponin I (High Sensitivity): 39 ng/L — ABNORMAL HIGH (ref <18)
Troponin I (High Sensitivity): 28 ng/L — ABNORMAL HIGH (ref <18)

## 2022-07-31 LAB — LIPASE, BLOOD: Lipase: 29 U/L (ref 11–51)

## 2022-07-31 MED ORDER — DOCUSATE SODIUM 100 MG PO CAPS
100.0000 mg | ORAL_CAPSULE | Freq: Two times a day (BID) | ORAL | Status: DC
  Administered 2022-07-31: 100 mg via ORAL
  Filled 2022-07-31 (×4): qty 1

## 2022-07-31 MED ORDER — METHYLPREDNISOLONE SODIUM SUCC 40 MG IJ SOLR
40.0000 mg | Freq: Two times a day (BID) | INTRAMUSCULAR | Status: AC
  Administered 2022-07-31 – 2022-08-01 (×2): 40 mg via INTRAVENOUS
  Filled 2022-07-31 (×2): qty 1

## 2022-07-31 MED ORDER — MOMETASONE FURO-FORMOTEROL FUM 200-5 MCG/ACT IN AERO
2.0000 | INHALATION_SPRAY | Freq: Two times a day (BID) | RESPIRATORY_TRACT | Status: DC
  Administered 2022-07-31: 2 via RESPIRATORY_TRACT
  Filled 2022-07-31 (×2): qty 8.8

## 2022-07-31 MED ORDER — METHYLPREDNISOLONE SODIUM SUCC 125 MG IJ SOLR
125.0000 mg | Freq: Once | INTRAMUSCULAR | Status: AC
  Administered 2022-07-31: 125 mg via INTRAVENOUS
  Filled 2022-07-31: qty 2

## 2022-07-31 MED ORDER — NICOTINE 21 MG/24HR TD PT24
21.0000 mg | MEDICATED_PATCH | Freq: Every day | TRANSDERMAL | Status: DC
  Administered 2022-07-31 – 2022-08-01 (×2): 21 mg via TRANSDERMAL
  Filled 2022-07-31 (×2): qty 1

## 2022-07-31 MED ORDER — ONDANSETRON HCL 4 MG/2ML IJ SOLN: 4.0000 mg | Freq: Four times a day (QID) | INTRAMUSCULAR | Status: DC | PRN

## 2022-07-31 MED ORDER — ONDANSETRON HCL 4 MG PO TABS: 4.0000 mg | ORAL_TABLET | Freq: Four times a day (QID) | ORAL | Status: DC | PRN

## 2022-07-31 MED ORDER — ACETAMINOPHEN 325 MG PO TABS
650.0000 mg | ORAL_TABLET | Freq: Four times a day (QID) | ORAL | Status: DC | PRN
  Administered 2022-07-31: 650 mg via ORAL
  Filled 2022-07-31: qty 2

## 2022-07-31 MED ORDER — SERTRALINE HCL 50 MG PO TABS
100.0000 mg | ORAL_TABLET | Freq: Every day | ORAL | Status: DC
  Administered 2022-07-31 – 2022-08-01 (×2): 100 mg via ORAL
  Filled 2022-07-31 (×2): qty 2

## 2022-07-31 MED ORDER — HYDRALAZINE HCL 20 MG/ML IJ SOLN
10.0000 mg | Freq: Four times a day (QID) | INTRAMUSCULAR | Status: DC | PRN
  Administered 2022-07-31: 10 mg via INTRAVENOUS
  Filled 2022-07-31: qty 1

## 2022-07-31 MED ORDER — ASPIRIN 81 MG PO CHEW
324.0000 mg | CHEWABLE_TABLET | Freq: Once | ORAL | Status: AC
  Administered 2022-07-31: 324 mg via ORAL
  Filled 2022-07-31: qty 4

## 2022-07-31 MED ORDER — ACETAMINOPHEN 650 MG RE SUPP: 650.0000 mg | Freq: Four times a day (QID) | RECTAL | Status: DC | PRN

## 2022-07-31 MED ORDER — OXYCODONE HCL 5 MG PO TABS
5.0000 mg | ORAL_TABLET | Freq: Four times a day (QID) | ORAL | Status: DC | PRN
  Administered 2022-07-31 – 2022-08-01 (×3): 5 mg via ORAL
  Filled 2022-07-31 (×3): qty 1

## 2022-07-31 MED ORDER — AMLODIPINE BESYLATE 10 MG PO TABS
10.0000 mg | ORAL_TABLET | Freq: Every day | ORAL | Status: DC
  Administered 2022-07-31 – 2022-08-01 (×2): 10 mg via ORAL
  Filled 2022-07-31: qty 1
  Filled 2022-07-31: qty 2

## 2022-07-31 MED ORDER — PREDNISONE 20 MG PO TABS: 40.0000 mg | ORAL_TABLET | Freq: Every day | ORAL | Status: DC

## 2022-07-31 MED ORDER — BUPROPION HCL ER (XL) 150 MG PO TB24
150.0000 mg | ORAL_TABLET | Freq: Every day | ORAL | Status: DC
  Administered 2022-07-31 – 2022-08-01 (×2): 150 mg via ORAL
  Filled 2022-07-31 (×2): qty 1

## 2022-07-31 MED ORDER — FAMOTIDINE 20 MG PO TABS
20.0000 mg | ORAL_TABLET | Freq: Every day | ORAL | Status: DC
  Administered 2022-07-31 – 2022-08-01 (×2): 20 mg via ORAL
  Filled 2022-07-31 (×2): qty 1

## 2022-07-31 MED ORDER — SODIUM CHLORIDE 0.9% FLUSH
3.0000 mL | Freq: Two times a day (BID) | INTRAVENOUS | Status: DC
  Administered 2022-07-31 – 2022-08-01 (×3): 3 mL via INTRAVENOUS

## 2022-07-31 MED ORDER — PANTOPRAZOLE SODIUM 40 MG PO TBEC
40.0000 mg | DELAYED_RELEASE_TABLET | Freq: Every day | ORAL | Status: DC
  Administered 2022-07-31 – 2022-08-01 (×2): 40 mg via ORAL
  Filled 2022-07-31 (×2): qty 1

## 2022-07-31 MED ORDER — IPRATROPIUM-ALBUTEROL 0.5-2.5 (3) MG/3ML IN SOLN
3.0000 mL | Freq: Once | RESPIRATORY_TRACT | Status: AC
  Administered 2022-07-31: 3 mL via RESPIRATORY_TRACT
  Filled 2022-07-31: qty 3

## 2022-07-31 MED ORDER — SODIUM CHLORIDE 0.9 % IV BOLUS
500.0000 mL | Freq: Once | INTRAVENOUS | Status: AC
  Administered 2022-07-31: 500 mL via INTRAVENOUS

## 2022-07-31 MED ORDER — TRAZODONE HCL 50 MG PO TABS
150.0000 mg | ORAL_TABLET | Freq: Every evening | ORAL | Status: DC | PRN
  Administered 2022-07-31: 150 mg via ORAL
  Filled 2022-07-31 (×3): qty 1

## 2022-07-31 MED ORDER — ENOXAPARIN SODIUM 40 MG/0.4ML IJ SOSY
40.0000 mg | PREFILLED_SYRINGE | INTRAMUSCULAR | Status: DC
  Administered 2022-07-31: 40 mg via SUBCUTANEOUS
  Filled 2022-07-31: qty 0.4

## 2022-07-31 MED ORDER — OXYCODONE HCL 5 MG PO TABS
5.0000 mg | ORAL_TABLET | Freq: Once | ORAL | Status: AC
  Administered 2022-07-31: 5 mg via ORAL
  Filled 2022-07-31: qty 1

## 2022-07-31 MED ORDER — IPRATROPIUM-ALBUTEROL 0.5-2.5 (3) MG/3ML IN SOLN: 3.0000 mL | RESPIRATORY_TRACT | Status: DC | PRN

## 2022-07-31 NOTE — ED Notes (Signed)
Pt presents to ED with c/o of SOB that started last night, pt states recent DX of COPD and states he does take inhalers but states SOB increases when laying flat. Pt does appear to have some difficulty speaking in full sentences without increased SOB. Pt denies fevers or chills at this time.   Pt does also endorse ABD pain, pt states HX of pancreatitis. Pt is A&Ox4 at this time.

## 2022-07-31 NOTE — ED Notes (Signed)
Pt received meal tray. 

## 2022-07-31 NOTE — Progress Notes (Addendum)
Update 2130: pt refused bed alarm at this time but was educated about its importance. Will continue to monitor.  CCMD called and reported of a 9 beats of non sutained vtach HR went up to 139 but back now on SR HR 80. Pt only complaints pain on the left abdomen and SOB. PRN tylenol given and pt was palced on 2 liters oxygen. NP Jordan Likes ware. Will continue to monitor.  Update 2224: NP Randol Kern placed order. Will contineu to monitor.  Update 0017: NP Randol Kern placed cardia monitoring. Will continue to monitor.

## 2022-07-31 NOTE — ED Triage Notes (Signed)
Pt to ED for shob that started last night. Reports difficulty lying flat to sleep.

## 2022-07-31 NOTE — ED Notes (Signed)
Pt ambulatory O2 sat maintained above 93% or greater with NAD noted, MD aware.   Pt given remote and shasta cola.

## 2022-07-31 NOTE — ED Notes (Signed)
Pt requesting pain medication. MD Pokhrel notified. Pt given ice cream and ate entire meal tray.

## 2022-07-31 NOTE — H&P (Addendum)
[facility] History and Physical  Noah Velasquez DOB: [birth year] DOA: 07/31/2022  Referring physician: ED  PCP: Jearld Fenton, NP   Patient is coming from: Home  Chief Complaint: Shortness of breath  HPI:  Patient is a 56 years old male with past medical history of COPD, interstitial lung disease, chronic pancreatitis presented hospital with shortness of breath.  He also had some abdominal pain and his last drink was a week back.  Patient has been having difficulty sleeping as well.  Denies any increasing swelling in his legs.  He also complained of orthopnea.  Patient complains of having some productive cough with shortness of breath which is brownish in color.  Denies any hemoptysis, chest pain, fever, chills or rigor.  Denies any sick contacts or recent travel but thinks that he could have some viral infection.  Patient however continues to smoke cigarettes.  Denies any nausea, vomiting, abdominal pain.  Denies any urinary urgency, frequency or dysuria.  Denies any dizziness, lightheadedness or syncope.  In the ED, patient was noted to have elevated blood pressure at 147/94.  Laboratory data showed elevated BNP and troponin.  EKG showed ST depression in V2 V3 segment slightly more pronounced than prior EKG with some right bundle branch block.  Patient received aspirin 324 mg in the ED, oxycodone nebulizer and Solu-Medrol and was admitted hospital for further evaluation and treatment.  Assessment and Plan  Shortness of breath dyspnea.  History of COPD/interstitial lung disease with mild exacerbation.  Likely secondary to noncompliance to inhalers and ongoing smoking.  Continue inhalers including bronchodilators.  Patient has ran out of medications.  Patient is negative for COVID and influenza including respiratory syncytial virus.  No leukocytosis or fever.  Will put the patient on IV steroids, continue inhaled steroids as well.  Patient stated that he was not able to  afford medication.  Add doxycycline due to increased dyspnea and production of sputum.  Abnormal EKG with ST depression and mild elevated troponin.  Spoke with ED provider for cardiology evaluation.  Will trend troponins.  No chest pain so we will hold off with anticoagulation.  Follow cardiology recommendations.  Add aspirin.  Check lipid profile in AM.    Prediabetes.  Latest hemoglobin A1c of 6.3.  Counseling done on lifestyle modification.  Will use sliding scale insulin if necessary since patient will be on steroids.  Chronic abdominal pain chronic pancreatitis, history of alcohol abuse. Lipase at 29.  Has chronic pancreatitis.  LFTs within normal limits.  Advised cessation of alcohol.  Will add as needed Ativan, thiamine and folic acid.  Last drink was last week.  Will add multivitamin as well.  Hypertension.  Accelerated on presentation.  Likely secondary to noncompliance.  Patient is on amlodipine as outpatient but has ran out of that medication.  Will restart.  Add hydralazine as needed.  GERD.  Patient is on Pepcid and pantoprazole.  Will continue.  Add dicyclomine.  Cigarette smoking.  Counseling done.  Will put patient on nicotine patch.  DVT Prophylaxis: Lovenox subcu  Review of Systems:  All systems were reviewed and were negative unless otherwise mentioned in the HPI   Past Medical History:  Diagnosis Date   Chronic hip pain    Chronic pancreatitis (HCC)    CKD (chronic kidney disease) stage 3, GFR 30-59 ml/min (HCC)    GERD (gastroesophageal reflux disease)    Hyperlipidemia    Hypertension    Osteoarthritis    Pancreatitis    Pancreatitis  PTSD (post-traumatic stress disorder)    Viral infection of left eye    Past Surgical History:  Procedure Laterality Date   pancreatic stent placement      Social History:  reports that he has been smoking cigarettes. He has a 3.75 pack-year smoking history. He has never used smokeless tobacco. He reports current alcohol  use of about 7.0 standard drinks of alcohol per week. He reports current drug use. Drug: Marijuana.  Allergies  Allergen Reactions   Hctz [Hydrochlorothiazide] Swelling    Family History  Problem Relation Age of Onset   Diabetes Mother    Sleep apnea Mother    Emphysema Sister    CAD Neg Hx    Mental illness Neg Hx      Prior to Admission medications   Medication Sig Start Date End Date Taking? Authorizing Provider  amLODipine (NORVASC) 10 MG tablet TAKE 1 TABLET BY MOUTH ONCE DAILY 07/06/22  Yes Baity, Coralie Keens, NP  budesonide-formoterol (SYMBICORT) 160-4.5 MCG/ACT inhaler Inhale 2 puffs into the lungs 2 (two) times daily. 06/03/22  Yes Emeterio Reeve, DO  buPROPion (WELLBUTRIN XL) 150 MG 24 hr tablet Take 1 tablet (150 mg total) by mouth daily. 06/03/22  Yes Emeterio Reeve, DO  dextromethorphan-guaiFENesin [facility] DM) 30-600 MG 12hr tablet Take 1 tablet by mouth 2 (two) times daily as needed for cough. 06/03/22  Yes Emeterio Reeve, DO  famotidine (PEPCID) 20 MG tablet Take 1 tablet (20 mg total) by mouth daily. 06/03/22  Yes Emeterio Reeve, DO  ipratropium-albuterol (DUONEB) 0.5-2.5 (3) MG/3ML SOLN Take 3 mLs by nebulization every 2 (two) hours as needed (wheeze, SOB). Can use instead of albuterol inhaler 06/03/22  Yes Emeterio Reeve, DO  naproxen (NAPROSYN) 375 MG tablet Take 1 tablet (375 mg total) by mouth 2 (two) times daily as needed for moderate pain or mild pain. 06/03/22  Yes Emeterio Reeve, DO  Nebulizers (COMPRESSOR/NEBULIZER) MISC Nebulizer and tubes/mask to use as directed with DuoNeb as needed 06/03/22  Yes Emeterio Reeve, DO  omeprazole (PRILOSEC) 20 MG capsule Take 1 capsule (20 mg total) by mouth daily. 06/03/22  Yes Emeterio Reeve, DO  sertraline (ZOLOFT) 100 MG tablet TAKE 1 TABLET BY MOUTH ONCE DAILY *PLEASE MAKE APPOINTMENT FOR FURTHER FILLS* 07/06/22  Yes Baity, Coralie Keens, NP  traZODone (DESYREL) 150 MG tablet Take 1 tablet (150 mg  total) by mouth at bedtime as needed for sleep. 06/03/22  Yes Emeterio Reeve, DO  VENTOLIN HFA 108 (90 Base) MCG/ACT inhaler INHALE 1-2 PUFFS INTO THE LLUNGS EVERY 6HOURS AS NEEDED FOR WHEEZING OR SHORTNESS OF BREATH 06/03/22  Yes Emeterio Reeve, DO  aspirin EC 81 MG tablet Take 1 tablet (81 mg total) by mouth daily. Swallow whole. Patient not taking: Reported on 07/31/2022 06/04/22   Emeterio Reeve, DO  Blood Pressure KIT Please allow patient to chose from machines that are covered under insurance 06/03/22   Emeterio Reeve, DO  nicotine (NICODERM CQ - DOSED IN MG/24 HOURS) 21 mg/24hr patch Place 1 patch (21 mg total) onto the skin daily. Patient not taking: Reported on 07/31/2022 06/04/22   Emeterio Reeve, DO    Physical Exam: Vitals:   07/31/22 0750 07/31/22 0800 07/31/22 0902 07/31/22 1236  BP: (!) 147/94 (!) 152/94 (!) 167/102   Pulse: 92 90 84 96  Resp: _0 Temp: 98.2 F (36.8 C)   98.2 F (36.8 C)  TempSrc:    Oral  SpO2: 91% 98% 96% 95%  Weight: 80.7 kg  Height: _0  (1.702 m)      Wt Readings from Last 3 Encounters:  07/31/22 80.7 kg  06/01/22 78.9 kg  12/31/21 77.1 kg   Body mass index is 27.88 kg/m.  General:  Average built, not in obvious distress HENT: Normocephalic, No scleral pallor or icterus noted. Oral mucosa is moist.  Chest: Decreased breath sounds bilaterally, coarse breath sounds noted, CVS: S1 &S2 heard. No murmur.  Regular rate and rhythm. Abdomen: Soft, nontender, nondistended.  Bowel sounds are heard. No abdominal mass palpated Extremities: No cyanosis, clubbing or edema.  Peripheral pulses are palpable. Psych: Alert, awake and oriented, normal mood CNS:  No cranial nerve deficits.  Power equal in all extremities.   Skin: Warm and dry.  No rashes noted.  Labs on Admission:   CBC: Recent Labs  Lab 07/31/22 0807  WBC 10.3  HGB 15.0  HCT 44.2  MCV 95.7  PLT 517    Basic Metabolic Panel: Recent Labs  Lab  07/31/22 0807  NA 136  K 3.6  CL 105  CO2 24  GLUCOSE 136*  BUN 16  CREATININE 1.15  CALCIUM 8.9    Liver Function Tests: Recent Labs  Lab 07/31/22 0807  AST 21  ALT 11  ALKPHOS 85  BILITOT 0.7  PROT 7.9  ALBUMIN 3.7   Recent Labs  Lab 07/31/22 0807  LIPASE 29   No results for input(s): "AMMONIA" in the last 168 hours.  Cardiac Enzymes: No results for input(s): "CKTOTAL", "CKMB", "CKMBINDEX", "TROPONINI" in the last 168 hours.  BNP (last 3 results) Recent Labs    07/31/22 0807  BNP 459.2*    ProBNP (last 3 results) No results for input(s): "PROBNP" in the last 8760 hours.  CBG: No results for input(s): "GLUCAP" in the last 168 hours.  Lipase     Component Value Date/Time   LIPASE 29 07/31/2022 0807     Urinalysis    Component Value Date/Time   COLORURINE STRAW (A) 12/08/2021 1929   APPEARANCEUR CLEAR (A) 12/08/2021 1929   LABSPEC 1.008 12/08/2021 1929   PHURINE 7.0 12/08/2021 1929   GLUCOSEU NEGATIVE 12/08/2021 1929   HGBUR NEGATIVE 12/08/2021 1929   BILIRUBINUR NEGATIVE 12/08/2021 1929   BILIRUBINUR Negative 10/20/2018 1443   KETONESUR 5 (A) 12/08/2021 1929   PROTEINUR NEGATIVE 12/08/2021 1929   UROBILINOGEN 0.2 10/20/2018 1443   NITRITE NEGATIVE 12/08/2021 1929   LEUKOCYTESUR NEGATIVE 12/08/2021 1929     Drugs of Abuse  No results found for: "LABOPIA", "COCAINSCRNUR", "LABBENZ", "AMPHETMU", "THCU", "LABBARB"    Radiological Exams on Admission: DG Chest 2 View  Result Date: 07/31/2022 CLINICAL DATA:  Shortness of breath. EXAM: CHEST - 2 VIEW COMPARISON:  Chest two views 06/01/2022, 12/08/2021; CT abdomen and pelvis 12/08/2021 FINDINGS: Cardiac silhouette and mediastinal contours are within normal limits. Mild calcification within the aortic arch. Moderate bilateral interstitial thickening is unchanged from multiple prior CTs and corresponds to the interlobular septal thickening and possible early honeycombing from interstitial lung disease  seen within the lung bases on prior CT abdomen and pelvis. No new airspace opacity. No pleural effusion or pneumothorax. Mild multilevel degenerative disc changes of the thoracic spine. IMPRESSION: Moderate bilateral interstitial thickening is unchanged from multiple prior CTS and again corresponds to chronic interstitial lung disease. No acute lung process. Electronically Signed   By: Yvonne Kendall M.D.   On: 07/31/2022 08:37    EKG: Personally reviewed by me which shows RBBB, ST segment depression   Consultant: Cardiology  Code  Status: Full code  Microbiology none  Antibiotics: None  Family Communication:  Patients' condition and plan of care including tests being ordered have been discussed with the patient  who indicate understanding and agree with the plan.   Status is: Observation The patient remains OBS appropriate and will d/c before 2 midnights.   Severity of Illness: The appropriate patient status for this patient is OBSERVATION. Observation status is judged to be reasonable and necessary in order to provide the required intensity of service to ensure the patient's safety. The patient's presenting symptoms, physical exam findings, and initial radiographic and laboratory data in the context of their medical condition is felt to place them at decreased risk for further clinical deterioration. Furthermore, it is anticipated that the patient will be medically stable for discharge from the hospital within 2 midnights of admission.   Signed, Flora Lipps, MD [facility] 07/31/2022

## 2022-07-31 NOTE — ED Provider Notes (Signed)
[facility] Provider Note    Event Date/Time   First MD Initiated Contact with Patient 07/31/22 [phone]     (approximate)   History   Shortness of Breath   HPI  Noah Velasquez is a 56 y.o. male with history of interstitial lung disease, chronic pancreatitis, COPD who comes in with concerns for shortness of breath.  I reviewed patient's hospital admission where patient was on 2 L of oxygen on 10/16 secondary to ILD versus COPD as well as treated for alcoholic pancreatitis.  Patient reports he Last drank this past weekend.  He reports having chronic pancreatitis and this pain being the exact same that he typically has.  He reports that the pain is not worse than it normally is.  Denies any pain on the right side.  The pain is only on the left side.  He denies any chest pain but does report increasing shortness of breath.  He does state that sometimes when he sleeping he wakes up and he can see years himself snoring and feels like he is not breathing when he is snoring.  He denies any new swelling in his legs.  No calf tenderness.  Denies any chest pain or history of heart attack.   Physical Exam   Triage Vital Signs: ED Triage Vitals  Enc Vitals Group     BP 07/31/22 0750 (!) 147/94     Pulse Rate 07/31/22 0750 92     Resp 07/31/22 0750 20     Temp 07/31/22 0750 98.2 F (36.8 C)     Temp src --      SpO2 07/31/22 0750 91 %     Weight 07/31/22 0750 178 lb (80.7 kg)     Height 07/31/22 0750 '5\' 7"'$  (1.702 m)     Head Circumference --      Peak Flow --      Pain Score 07/31/22 0749 0     Pain Loc --      Pain Edu? --      Excl. in [place]? --     Most recent vital signs: Vitals:   07/31/22 0750  BP: (!) 147/94  Pulse: 92  Resp: 20  Temp: 98.2 F (36.8 C)  SpO2: 91%     General: Awake, no distress.  CV:  Good peripheral perfusion.  Resp:  Normal effort.  Abd:  No distention.  Mild tenderness without any rebound or guarding on his left side Other:  No  swelling in legs.  No calf tenderness   ED Results / Procedures / Treatments   Labs (all labs ordered are listed, but only abnormal results are displayed) Labs Reviewed  BASIC METABOLIC PANEL - Abnormal; Notable for the following components:      Result Value   Glucose, Bld 136 (*)    All other components within normal limits  BRAIN NATRIURETIC PEPTIDE - Abnormal; Notable for the following components:   B Natriuretic Peptide 459.2 (*)    All other components within normal limits  TROPONIN I (HIGH SENSITIVITY) - Abnormal; Notable for the following components:   Troponin I (High Sensitivity) 42 (*)    All other components within normal limits  RESP PANEL BY RT-PCR (RSV, FLU A&B, COVID)  [id number]  CBC  HEPATIC FUNCTION PANEL  LIPASE, BLOOD  TROPONIN I (HIGH SENSITIVITY)     EKG  My interpretation of EKG:  Normal sinus rhythm 93 without any ST elevation, right bundle branch block with a little ST depression  V2 and V3-may be slightly more pronounced than prior EKG  RADIOLOGY I have reviewed the xray personally and interpreted and patient has bilateral thickening consistent with known interstitial lung disease.   PROCEDURES:  Critical Care performed: No  .1-3 Lead EKG Interpretation  Performed by: Vanessa Gans, MD Authorized by: Vanessa [place], MD     Interpretation: normal     ECG rate:  80   ECG rate assessment: normal     Rhythm: sinus rhythm     Ectopy: none     Conduction: normal      MEDICATIONS ORDERED IN ED: Medications  aspirin chewable tablet 324 mg (has no administration in time range)  oxyCODONE (Oxy IR/ROXICODONE) immediate release tablet 5 mg (5 mg Oral Given 07/31/22 0842)  sodium chloride 0.9 % bolus 500 mL (500 mLs Intravenous Bolus 07/31/22 0842)  ipratropium-albuterol (DUONEB) 0.5-2.5 (3) MG/3ML nebulizer solution 3 mL (3 mLs Nebulization Given 07/31/22 0842)  methylPREDNISolone sodium succinate (SOLU-MEDROL) 125 mg/2 mL injection 125 mg (125 mg  Intravenous Given 07/31/22 0842)     IMPRESSION / MDM / [place] / ED COURSE  I reviewed the triage vital signs and the nursing notes.   Patient's presentation is most consistent with acute presentation with potential threat to life or bodily function.   Differential shortness of breath secondary to COPD, interstitial lung disease, CHF, ACS.  Patient will be trialed on a DuoNeb and steroids.  His abdomen overall is reassuring does report chronic pain and reports this is similar to prior does report some EtOH use as well as cocaine use so I suspect more likely just from his chronic pancreatitis with we will give a little bit of fluids and oxycodone to help with this.  Patient CBC is reassuring.  BMP shows stable creatinine.  Hepatic function is normal.  BNP is slightly elevated.  Do not see the patient's had any recent echoes.  Troponin slightly higher than his normal baseline plus the EKG changes we will discuss with the patient for admission for cardiac monitoring, echocardiogram.  I will load patient with aspirin.  Will continue to trend off troponins and if significantly elevated will start him on heparin but given no active chest pain right now he seems pretty comfortable we can hold off.  I did send a message to Dr. Clayborn Bigness for consultation.  I did discuss with the hospital team for admission    The patient is on the cardiac monitor to evaluate for evidence of arrhythmia and/or significant heart rate changes.      FINAL CLINICAL IMPRESSION(S) / ED DIAGNOSES   Final diagnoses:  Chest pain, unspecified type  ILD (interstitial lung disease) ([place])     Rx / DC Orders   ED Discharge Orders     None        Note:  This document was prepared using Dragon voice recognition software and may include unintentional dictation errors.   Vanessa , MD 07/31/22 [phone]

## 2022-08-01 ENCOUNTER — Other Ambulatory Visit: Payer: Self-pay

## 2022-08-01 DIAGNOSIS — K860 Alcohol-induced chronic pancreatitis: Secondary | ICD-10-CM | POA: Diagnosis not present

## 2022-08-01 DIAGNOSIS — F32A Depression, unspecified: Secondary | ICD-10-CM | POA: Diagnosis not present

## 2022-08-01 DIAGNOSIS — I160 Hypertensive urgency: Secondary | ICD-10-CM | POA: Diagnosis not present

## 2022-08-01 DIAGNOSIS — J849 Interstitial pulmonary disease, unspecified: Secondary | ICD-10-CM | POA: Diagnosis not present

## 2022-08-01 DIAGNOSIS — K219 Gastro-esophageal reflux disease without esophagitis: Secondary | ICD-10-CM | POA: Diagnosis not present

## 2022-08-01 DIAGNOSIS — F419 Anxiety disorder, unspecified: Secondary | ICD-10-CM | POA: Diagnosis not present

## 2022-08-01 DIAGNOSIS — R0602 Shortness of breath: Secondary | ICD-10-CM | POA: Diagnosis not present

## 2022-08-01 LAB — COMPREHENSIVE METABOLIC PANEL
ALT: 10 U/L (ref 0–44)
AST: 18 U/L (ref 15–41)
Albumin: 3.4 g/dL — ABNORMAL LOW (ref 3.5–5.0)
Alkaline Phosphatase: 76 U/L (ref 38–126)
Anion gap: 5 (ref 5–15)
BUN: 20 mg/dL (ref 6–20)
CO2: 22 mmol/L (ref 22–32)
Calcium: 8.8 mg/dL — ABNORMAL LOW (ref 8.9–10.3)
Chloride: 105 mmol/L (ref 98–111)
Creatinine, Ser: 1.01 mg/dL (ref 0.61–1.24)
GFR, Estimated: >60 mL/min (ref >60)
Glucose, Bld: 210 mg/dL — ABNORMAL HIGH (ref 70–99)
Potassium: 4.2 mmol/L (ref 3.5–5.1)
Sodium: 132 mmol/L — ABNORMAL LOW (ref 135–145)
Total Bilirubin: 0.5 mg/dL (ref 0.3–1.2)
Total Protein: 7.6 g/dL (ref 6.5–8.1)

## 2022-08-01 LAB — PROTIME-INR
INR: 1.2 (ref 0.8–1.2)
Prothrombin Time: 15 seconds (ref 11.4–15.2)

## 2022-08-01 LAB — CBC
HCT: 40.7 % (ref 39.0–52.0)
Hemoglobin: 13.4 g/dL (ref 13.0–17.0)
MCH: 31.2 pg (ref 26.0–34.0)
MCHC: 32.9 g/dL (ref 30.0–36.0)
MCV: 94.7 fL (ref 80.0–100.0)
Platelets: 355 10*3/uL (ref 150–400)
RBC: 4.3 MIL/uL (ref 4.22–5.81)
RDW: 13.2 % (ref 11.5–15.5)
WBC: 8.8 10*3/uL (ref 4.0–10.5)
nRBC: 0 % (ref 0.0–0.2)

## 2022-08-01 LAB — LIPID PANEL
Cholesterol: 138 mg/dL (ref 0–200)
HDL: 60 mg/dL (ref >40)
LDL Cholesterol: 72 mg/dL (ref 0–99)
Total CHOL/HDL Ratio: 2.3 RATIO
Triglycerides: 30 mg/dL (ref <150)
VLDL: 6 mg/dL (ref 0–40)

## 2022-08-01 LAB — MAGNESIUM: Magnesium: 2.2 mg/dL (ref 1.7–2.4)

## 2022-08-01 MED ORDER — BUDESONIDE-FORMOTEROL FUMARATE 160-4.5 MCG/ACT IN AERO: 2.0000 | INHALATION_SPRAY | Freq: Two times a day (BID) | RESPIRATORY_TRACT | 2 refills | Status: DC

## 2022-08-01 MED ORDER — PREDNISONE 20 MG PO TABS: 40.0000 mg | ORAL_TABLET | Freq: Every day | ORAL | 0 refills | Status: AC

## 2022-08-01 MED ORDER — VENTOLIN HFA 108 (90 BASE) MCG/ACT IN AERS: INHALATION_SPRAY | RESPIRATORY_TRACT | 0 refills | Status: DC

## 2022-08-01 MED ORDER — PHENOL 1.4 % MT LIQD
1.0000 | OROMUCOSAL | Status: DC | PRN
  Administered 2022-08-01: 1 via OROMUCOSAL
  Filled 2022-08-01: qty 177

## 2022-08-01 MED ORDER — TRAZODONE HCL 150 MG PO TABS: 150.0000 mg | ORAL_TABLET | Freq: Every evening | ORAL | 0 refills | Status: DC | PRN

## 2022-08-01 MED ORDER — FAMOTIDINE 20 MG PO TABS: 20.0000 mg | ORAL_TABLET | Freq: Every day | ORAL | 0 refills | Status: DC

## 2022-08-01 MED ORDER — BUPROPION HCL ER (XL) 150 MG PO TB24: 150.0000 mg | ORAL_TABLET | Freq: Every day | ORAL | 2 refills | Status: DC

## 2022-08-01 MED ORDER — OMEPRAZOLE 20 MG PO CPDR: 20.0000 mg | DELAYED_RELEASE_CAPSULE | Freq: Every day | ORAL | 0 refills | Status: DC

## 2022-08-01 MED ORDER — AMLODIPINE BESYLATE 10 MG PO TABS: 10.0000 mg | ORAL_TABLET | Freq: Every day | ORAL | 2 refills | Status: DC

## 2022-08-01 MED ORDER — IPRATROPIUM-ALBUTEROL 0.5-2.5 (3) MG/3ML IN SOLN: 3.0000 mL | RESPIRATORY_TRACT | 1 refills | Status: DC | PRN

## 2022-08-01 MED ORDER — SERTRALINE HCL 100 MG PO TABS: 100.0000 mg | ORAL_TABLET | Freq: Every day | ORAL | 0 refills | Status: DC

## 2022-08-01 MED ORDER — ASPIRIN 81 MG PO TBEC: 81.0000 mg | DELAYED_RELEASE_TABLET | Freq: Every day | ORAL | 12 refills | Status: DC

## 2022-08-01 MED ORDER — DM-GUAIFENESIN ER 30-600 MG PO TB12: 1.0000 | ORAL_TABLET | Freq: Two times a day (BID) | ORAL | Status: DC | PRN

## 2022-08-01 NOTE — Plan of Care (Signed)
  Problem: Education: Goal: Knowledge of disease or condition will improve Outcome: Adequate for Discharge Goal: Knowledge of the prescribed therapeutic regimen will improve Outcome: Adequate for Discharge Goal: Individualized Educational Video(s) Outcome: Adequate for Discharge   Problem: Activity: Goal: Ability to tolerate increased activity will improve Outcome: Adequate for Discharge Goal: Will verbalize the importance of balancing activity with adequate rest periods Outcome: Adequate for Discharge   Problem: Respiratory: Goal: Ability to maintain a clear airway will improve Outcome: Adequate for Discharge Goal: Levels of oxygenation will improve Outcome: Adequate for Discharge Goal: Ability to maintain adequate ventilation will improve Outcome: Adequate for Discharge   

## 2022-08-01 NOTE — Progress Notes (Signed)
   08/01/22 0916  Oxygen Therapy  SpO2 94 %  O2 Device Room Air    Patient w/o nasal canula for approximately 20 minutes. O2 sat as above. He denied any SOB or any distress. Lung sound clear/dimished.

## 2022-08-01 NOTE — Discharge Summary (Signed)
Physician Discharge Summary  Noah Velasquez ZOX:[id number] DOB: Jun 25, 1966 DOA: 07/31/2022  PCP: Jearld Fenton, NP  Admit date: 07/31/2022 Discharge date: 08/01/2022  Admitted From: Home  Discharge disposition: Home  Recommendations for Outpatient Follow-Up:   Follow up with your primary care provider in one week.  Check CBC, BMP, magnesium in the next visit Patient was counseled regarding compliance with medications and inhalers.  Discharge Diagnosis:   Principal Problem:   SOB (shortness of breath) Active Problems:   ILD (interstitial lung disease) (HCC)   Chronic pancreatitis (HCC)   Hypertensive urgency   Anxiety and depression   GERD (gastroesophageal reflux disease)   Discharge Condition: Improved.  Diet recommendation: Regular.  Wound care: None.  Code status: Full.   History of Present Illness:   Patient is a 56 years old male with past medical history of COPD, interstitial lung disease, chronic pancreatitis presented hospital with shortness of breath, productive cough.  He also had some abdominal pain and his last drink was a week back.  In the ED, patient was noted to have elevated blood pressure at 147/94.  Laboratory data showed elevated BNP and troponin.  EKG showed ST depression in V2 V3 segment slightly more pronounced than prior EKG with some right bundle branch block.  Patient received aspirin 324 mg in the ED, oxycodone nebulizer and Solu-Medrol and was admitted hospital for further evaluation and treatment.   Hospital Course:   Following conditions were addressed during hospitalization as listed below,  Shortness of breath/ dyspnea.  History of COPD/interstitial lung disease with mild exacerbation.  Likely secondary to noncompliance to inhalers and ongoing smoking.  Patient has pretty much ran out of all his medication.  This will be prescribed on discharge.  TOC was consulted for assistance.  Patient does not have leukocytosis or fever.  Was on  IV steroids inhaled steroids during hospitalization.  Will continue bronchodilators and prednisone for next 3 days on discharge.  Abnormal EKG with ST depression and mild elevated troponin.  No chest pain.  Likely demand ischemia.  Cardiology followed the patient during hospitalization.  No invasive treatment plan.  Treat conservatively.    Prediabetes.  Latest hemoglobin A1c of 6.3.  Counseling done on lifestyle modification.     Chronic abdominal pain chronic pancreatitis, history of alcohol abuse. Lipase was 29.  Has chronic pancreatitis.  LFTs within normal limits.  Advised cessation of alcohol.     Hypertension.  Accelerated on presentation.  Likely secondary to noncompliance.  Patient is on amlodipine as outpatient but has ran out of that medication.  Will prescribe Omnicef.   GERD.  Patient is on Pepcid and pantoprazole.  Will continue on discharge.   Cigarette smoking.  Counseling done.   Disposition.  At this time, patient is stable for disposition home with outpatient PCP follow-up.  Medical Consultants:   Cardiology  Procedures:    None Subjective:   Today, patient seen and examined at bedside.  Feels better with breathing.  Denies any chest pain, shortness of breath, fevers chills.  Discharge Exam:   Vitals:   08/01/22 0916 08/01/22 1245  BP:  (!) 157/89  Pulse:  93  Resp:  18  Temp:  97.9 F (36.6 C)  SpO2: 94% 97%   Vitals:   08/01/22 0500 08/01/22 0754 08/01/22 0916 08/01/22 1245  BP:  (!) 165/92  (!) 157/89  Pulse:  75  93  Resp:  20  18  Temp:  (!) 97.5 F (36.4 C)  97.9  F (36.6 C)  TempSrc:  Oral  Oral  SpO2:  95% 94% 97%  Weight: 81.1 kg     Height:      Body mass index is 28 kg/m.  General: Alert awake, not in obvious distress HENT: pupils equally reacting to light,  No scleral pallor or icterus noted. Oral mucosa is moist.  Chest:    Diminished breath sounds bilaterally. No crackles or wheezes.  CVS: S1 &S2 heard. No murmur.  Regular rate  and rhythm. Abdomen: Soft, nontender, nondistended.  Bowel sounds are heard.   Extremities: No cyanosis, clubbing or edema.  Peripheral pulses are palpable. Psych: Alert, awake and oriented, normal mood CNS:  No cranial nerve deficits.  Power equal in all extremities.   Skin: Warm and dry.  No rashes noted.  The results of significant diagnostics from this hospitalization (including imaging, microbiology, ancillary and laboratory) are listed below for reference.     Diagnostic Studies:   DG Chest 2 View  Result Date: 07/31/2022 CLINICAL DATA:  Shortness of breath. EXAM: CHEST - 2 VIEW COMPARISON:  Chest two views 06/01/2022, 12/08/2021; CT abdomen and pelvis 12/08/2021 FINDINGS: Cardiac silhouette and mediastinal contours are within normal limits. Mild calcification within the aortic arch. Moderate bilateral interstitial thickening is unchanged from multiple prior CTs and corresponds to the interlobular septal thickening and possible early honeycombing from interstitial lung disease seen within the lung bases on prior CT abdomen and pelvis. No new airspace opacity. No pleural effusion or pneumothorax. Mild multilevel degenerative disc changes of the thoracic spine. IMPRESSION: Moderate bilateral interstitial thickening is unchanged from multiple prior CTS and again corresponds to chronic interstitial lung disease. No acute lung process. Electronically Signed   By: Yvonne Kendall M.D.   On: 07/31/2022 08:37     Labs:   Basic Metabolic Panel: Recent Labs  Lab 07/31/22 0807 08/01/22 0342  NA 136 132*  K 3.6 4.2  CL 105 105  CO2 24 22  GLUCOSE 136* 210*  BUN 16 20  CREATININE 1.15 1.01  CALCIUM 8.9 8.8*  MG  --  2.2   GFR Estimated Creatinine Clearance: 82.3 mL/min (by C-G formula based on SCr of 1.01 mg/dL). Liver Function Tests: Recent Labs  Lab 07/31/22 0807 08/01/22 0342  AST 21 18  ALT 11 10  ALKPHOS 85 76  BILITOT 0.7 0.5  PROT 7.9 7.6  ALBUMIN 3.7 3.4*   Recent  Labs  Lab 07/31/22 0807  LIPASE 29   No results for input(s): "AMMONIA" in the last 168 hours. Coagulation profile Recent Labs  Lab 08/01/22 0342  INR 1.2    CBC: Recent Labs  Lab 07/31/22 0807 08/01/22 0342  WBC 10.3 8.8  HGB 15.0 13.4  HCT 44.2 40.7  MCV 95.7 94.7  PLT 327 355   Cardiac Enzymes: No results for input(s): "CKTOTAL", "CKMB", "CKMBINDEX", "TROPONINI" in the last 168 hours. BNP: Invalid input(s): "POCBNP" CBG: No results for input(s): "GLUCAP" in the last 168 hours. D-Dimer No results for input(s): "DDIMER" in the last 72 hours. Hgb A1c No results for input(s): "HGBA1C" in the last 72 hours. Lipid Profile Recent Labs    08/01/22 0342  CHOL 138  HDL 60  LDLCALC 72  TRIG 30  CHOLHDL 2.3   Thyroid function studies No results for input(s): "TSH", "T4TOTAL", "T3FREE", "THYROIDAB" in the last 72 hours.  Invalid input(s): "FREET3" Anemia work up No results for input(s): "VITAMINB12", "FOLATE", "FERRITIN", "TIBC", "IRON", "RETICCTPCT" in the last 72 hours. Microbiology Recent Results (  from the past 240 hour(s))  Resp panel by RT-PCR (RSV, Flu A&B, Covid) Anterior Nasal Swab     Status: None   Collection Time: 07/31/22  8:45 AM   Specimen: Anterior Nasal Swab  Result Value Ref Range Status   SARS Coronavirus 2 by RT PCR NEGATIVE NEGATIVE Final   Influenza A by PCR NEGATIVE NEGATIVE Final   Influenza B by PCR NEGATIVE NEGATIVE Final   Resp Syncytial Virus by PCR NEGATIVE NEGATIVE Final    Comment: Performed at [facility], [address] [place], [place] [address]     Discharge Instructions:   Discharge Instructions     Diet - low sodium heart healthy   Complete by: As directed    Discharge instructions   Complete by: As directed    Follow-up with your primary care physician in 1 week.  Please do not smoke or drink alcohol.  Use inhaler and all the medications as prescribed.  Seek medical attention for worsening symptoms.    Increase activity slowly   Complete by: As directed       Allergies as of 08/01/2022       Reactions   Hctz [hydrochlorothiazide] Swelling        Medication List     TAKE these medications    amLODipine 10 MG tablet Commonly known as: NORVASC Take 1 tablet (10 mg total) by mouth daily.   aspirin EC 81 MG tablet Take 1 tablet (81 mg total) by mouth daily. Swallow whole.   Blood Pressure Kit Please allow patient to chose from machines that are covered under insurance   budesonide-formoterol 160-4.5 MCG/ACT inhaler Commonly known as: SYMBICORT Inhale 2 puffs into the lungs 2 (two) times daily.   buPROPion 150 MG 24 hr tablet Commonly known as: WELLBUTRIN XL Take 1 tablet (150 mg total) by mouth daily.   Compressor/Nebulizer Misc Nebulizer and tubes/mask to use as directed with DuoNeb as needed   dextromethorphan-guaiFENesin 30-600 MG 12hr tablet Commonly known as: MUCINEX DM Take 1 tablet by mouth 2 (two) times daily as needed for cough.   famotidine 20 MG tablet Commonly known as: PEPCID Take 1 tablet (20 mg total) by mouth daily.   ipratropium-albuterol 0.5-2.5 (3) MG/3ML Soln Commonly known as: DUONEB Take 3 mLs by nebulization every 2 (two) hours as needed (wheeze, SOB). Can use instead of albuterol inhaler   naproxen 375 MG tablet Commonly known as: NAPROSYN Take 1 tablet (375 mg total) by mouth 2 (two) times daily as needed for moderate pain or mild pain.   nicotine 21 mg/24hr patch Commonly known as: NICODERM CQ - dosed in mg/24 hours Place 1 patch (21 mg total) onto the skin daily.   omeprazole 20 MG capsule Commonly known as: PRILOSEC Take 1 capsule (20 mg total) by mouth daily.   predniSONE 20 MG tablet Commonly known as: DELTASONE Take 2 tablets (40 mg total) by mouth daily with breakfast for 3 days.   sertraline 100 MG tablet Commonly known as: ZOLOFT Take 1 tablet (100 mg total) by mouth daily. What changed: See the new instructions.    traZODone 150 MG tablet Commonly known as: DESYREL Take 1 tablet (150 mg total) by mouth at bedtime as needed for sleep.   Ventolin HFA 108 (90 Base) MCG/ACT inhaler Generic drug: albuterol INHALE 1-2 PUFFS INTO THE LLUNGS EVERY 6HOURS AS NEEDED FOR WHEEZING OR SHORTNESS OF BREATH          Time coordinating discharge: 39 minutes  Signed:  Timmia Cogburn  [facility] 08/01/2022, 1:33 PM

## 2022-08-01 NOTE — TOC Initial Note (Signed)
Transition of Care [facility]) - Initial/Assessment Note    Patient Details  Name: Noah Velasquez MRN: [id number] Date of Birth: 10/28/1965  [facility] CM/SW Contact:    Magnus Ivan, LCSW Phone Number: 08/01/2022, 11:36 AM  Clinical Narrative:                 TOC consulted for medication assistance. Patient states he has trouble paying his medication copays and will need assistance for the next few weeks until he gets paid. Patient confirms he has Medicaid and copays are $4 per prescription. CSW explained per [facility] website "A provider cannot refuse to provide services if a beneficiary cannot pay a copay at the time of service. If beneficiaries have any questions about Medicaid copays, they should call the [facility] ([phone]) or their [place] line" Printed copy of this from [facility] for patient to take to pharmacy. Patient states he is still current with his PCP and his nephew will transport him to the pharmacy and home today. He denies additional needs prior to DC.   Expected Discharge Plan: Home/Self Care Barriers to Discharge: Barriers Resolved   Patient Goals and CMS Choice Patient states their goals for this hospitalization and ongoing recovery are:: to return home CMS Medicare.gov Compare Post Acute Care list provided to:: Patient Choice offered to / list presented to : Patient  Expected Discharge Plan and Services Expected Discharge Plan: Home/Self Care       Living arrangements for the past 2 months: Single Family Home                                      Prior Living Arrangements/Services Living arrangements for the past 2 months: Single Family Home                     Activities of Daily Living Home Assistive Devices/Equipment: None, Cane (specify quad or straight) (1 cane priong) ADL Screening (condition at time of admission) Patient's cognitive ability adequate to safely complete daily  activities?: Yes Is the patient deaf or have difficulty hearing?: No Does the patient have difficulty seeing, even when wearing glasses/contacts?: No Does the patient have difficulty concentrating, remembering, or making decisions?: No Patient able to express need for assistance with ADLs?: Yes Does the patient have difficulty dressing or bathing?: No Independently performs ADLs?: Yes (appropriate for developmental age) Does the patient have difficulty walking or climbing stairs?: No Weakness of Legs: Right Weakness of Arms/Hands: None  Permission Sought/Granted                  Emotional Assessment       Orientation: : Oriented to Self, Oriented to Place, Oriented to Situation Alcohol / Substance Use: Not Applicable Psych Involvement: No (comment)  Admission diagnosis:  SOB (shortness of breath) [R06.02] ILD (interstitial lung disease) ([place]) [J84.9] Chest pain, unspecified type [R07.9] Patient Active Problem List   Diagnosis Date Noted   SOB (shortness of breath) 07/31/2022   Prediabetes 07/21/2022   ILD (interstitial lung disease) ([place]) 06/01/2022   Peripheral neuropathy 12/22/2021   Hypertensive urgency 12/21/2021   Aortic atherosclerosis ([place]) 12/10/2021   Overweight with body mass index (BMI) of 28 to 28.9 in adult 09/23/2021   HLD (hyperlipidemia) 01/26/2021   GERD (gastroesophageal reflux disease) 01/26/2021   Chronic pancreatitis ([place]) 01/26/2021   Erectile dysfunction 01/10/2020   Anxiety and  depression 06/05/2019   Alcohol use disorder, moderate, in sustained remission ([place]) 04/06/2019   Anemia 03/09/2016   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 [address] Phone: [phone] Fax: [phone]     Social Determinants of  Health (SDOH) Interventions    Readmission Risk Interventions     No data to display

## 2022-08-01 NOTE — Plan of Care (Signed)
  Problem: Education: Goal: Knowledge of General Education information will improve Description: Including pain rating scale, medication(s)/side effects and non-pharmacologic comfort measures Outcome: Progressing   Problem: Clinical Measurements: Goal: Will remain free from infection Outcome: Progressing   Problem: Clinical Measurements: Goal: Respiratory complications will improve Outcome: Progressing   Problem: Clinical Measurements: Goal: Cardiovascular complication will be avoided Outcome: Progressing   Problem: Activity: Goal: Risk for activity intolerance will decrease Outcome: Progressing   Problem: Pain Managment: Goal: General experience of comfort will improve Outcome: Progressing

## 2022-08-01 NOTE — Consult Note (Signed)
CARDIOLOGY CONSULT NOTE               Patient ID: Noah Velasquez MRN: [id number] DOB/AGE: 57/08/1965 57 y.o.  Admit date: 07/31/2022 Referring Physician Dr Laxman Pokhrel hospitalist Primary Physician Regina Baity, NP primary Primary Cardiologist  Reason for Consultation shortness of breath borderline troponins  HPI: Patient is a 56-year-old male history of interstitial lung disease COPD smoking chronic pancreatitis chronic alcohol consumption shortness of breath obesity presents with worsening dyspnea patient had some abdominal pain but states he has not drank any alcohol in over a week patient has had difficulty sleeping complaining of orthopnea PND productive cough brownish in color denies any fever chills or sweats no chest pain estimation the patient continues to smoke brought in because of dyspnea abnormal EKG cardiology was consulted for evaluation he was treated with steroids inhalers  Review of systems complete and found to be negative unless listed above     Past Medical History:  Diagnosis Date   Chronic hip pain    Chronic pancreatitis (HCC)    CKD (chronic kidney disease) stage 3, GFR 30-59 ml/min (HCC)    GERD (gastroesophageal reflux disease)    Hyperlipidemia    Hypertension    Osteoarthritis    Pancreatitis    Pancreatitis    PTSD (post-traumatic stress disorder)    Viral infection of left eye     Past Surgical History:  Procedure Laterality Date   pancreatic stent placement      Medications Prior to Admission  Medication Sig Dispense Refill Last Dose   amLODipine (NORVASC) 10 MG tablet TAKE 1 TABLET BY MOUTH ONCE DAILY 30 tablet 0 07/30/2022   budesonide-formoterol (SYMBICORT) 160-4.5 MCG/ACT inhaler Inhale 2 puffs into the lungs 2 (two) times daily. 1 each 0 Past Week   buPROPion (WELLBUTRIN XL) 150 MG 24 hr tablet Take 1 tablet (150 mg total) by mouth daily. 30 tablet 0 Past Month   dextromethorphan-guaiFENesin (MUCINEX DM) 30-600 MG 12hr tablet  Take 1 tablet by mouth 2 (two) times daily as needed for cough.   Past Week   famotidine (PEPCID) 20 MG tablet Take 1 tablet (20 mg total) by mouth daily. 30 tablet 0 Past Month   ipratropium-albuterol (DUONEB) 0.5-2.5 (3) MG/3ML SOLN Take 3 mLs by nebulization every 2 (two) hours as needed (wheeze, SOB). Can use instead of albuterol inhaler 60 mL 1 Past Month   naproxen (NAPROSYN) 375 MG tablet Take 1 tablet (375 mg total) by mouth 2 (two) times daily as needed for moderate pain or mild pain.   Past Month   Nebulizers (COMPRESSOR/NEBULIZER) MISC Nebulizer and tubes/mask to use as directed with DuoNeb as needed 1 each 0 Past Month   omeprazole (PRILOSEC) 20 MG capsule Take 1 capsule (20 mg total) by mouth daily. 30 capsule 0 Past Month   sertraline (ZOLOFT) 100 MG tablet TAKE 1 TABLET BY MOUTH ONCE DAILY *PLEASE MAKE APPOINTMENT FOR FURTHER FILLS* 30 tablet 0 Past Month   traZODone (DESYREL) 150 MG tablet Take 1 tablet (150 mg total) by mouth at bedtime as needed for sleep. 30 tablet 0 Past Month   VENTOLIN HFA 108 (90 Base) MCG/ACT inhaler INHALE 1-2 PUFFS INTO THE LLUNGS EVERY 6HOURS AS NEEDED FOR WHEEZING OR SHORTNESS OF BREATH 18 g 0 Past Month   aspirin EC 81 MG tablet Take 1 tablet (81 mg total) by mouth daily. Swallow whole. (Patient not taking: Reported on 07/31/2022) 30 tablet 12 Not Taking   Blood Pressure KIT Please allow   patient to chose from machines that are covered under insurance 1 kit 0    nicotine (NICODERM CQ - DOSED IN MG/24 HOURS) 21 mg/24hr patch Place 1 patch (21 mg total) onto the skin daily. (Patient not taking: Reported on 07/31/2022) 28 patch 0 Not Taking   Social History   Socioeconomic History   Marital status: Legally Separated    Spouse name: Not on file   Number of children: 2   Years of education: Not on file   Highest education level: High school graduate  Occupational History   Occupation: on disability  Tobacco Use   Smoking status: Every Day    Packs/day:  0.25    Years: 15.00    Total pack years: 3.75    Types: Cigarettes   Smokeless tobacco: Never  Vaping Use   Vaping Use: Never used  Substance and Sexual Activity   Alcohol use: Yes    Alcohol/week: 7.0 standard drinks of alcohol    Types: 7 Cans of beer per week    Comment: weekly   Drug use: Yes    Types: Marijuana   Sexual activity: Yes    Birth control/protection: Condom  Other Topics Concern   Not on file  Social History Narrative   Not on file   Social Determinants of Health   Financial Resource Strain: Not on file  Food Insecurity: No Food Insecurity (07/31/2022)   Hunger Vital Sign    Worried About Running Out of Food in the Last Year: Never true    Ran Out of Food in the Last Year: Never true  Transportation Needs: No Transportation Needs (07/31/2022)   PRAPARE - Transportation    Lack of Transportation (Medical): No    Lack of Transportation (Non-Medical): No  Physical Activity: Not on file  Stress: Not on file  Social Connections: Not on file  Intimate Partner Violence: Not At Risk (07/31/2022)   Humiliation, Afraid, Rape, and Kick questionnaire    Fear of Current or Ex-Partner: No    Emotionally Abused: No    Physically Abused: No    Sexually Abused: No    Family History  Problem Relation Age of Onset   Diabetes Mother    Sleep apnea Mother    Emphysema Sister    CAD Neg Hx    Mental illness Neg Hx       Review of systems complete and found to be negative unless listed above      PHYSICAL EXAM  General: Well developed, well nourished, in no acute distress HEENT:  Normocephalic and atramatic Neck:  No JVD.  Lungs: Clear bilaterally to auscultation and percussion. Heart: HRRR . Normal S1 and S2 without gallops or murmurs.  Abdomen: Bowel sounds are positive, abdomen soft and non-tender  Msk:  Back normal, normal gait. Normal strength and tone for age. Extremities: No clubbing, cyanosis or edema.   Neuro: Alert and oriented X 3. Psych:   Good affect, responds appropriately  Labs:   Lab Results  Component Value Date   WBC 8.8 08/01/2022   HGB 13.4 08/01/2022   HCT 40.7 08/01/2022   MCV 94.7 08/01/2022   PLT 355 08/01/2022    Recent Labs  Lab 08/01/22 0342  NA 132*  K 4.2  CL 105  CO2 22  BUN 20  CREATININE 1.01  CALCIUM 8.8*  PROT 7.6  BILITOT 0.5  ALKPHOS 76  ALT 10  AST 18  GLUCOSE 210*   Lab Results  Component Value Date     TROPONINI <0.03 05/06/2018    Lab Results  Component Value Date   CHOL 138 08/01/2022   CHOL 153 06/02/2022   CHOL 150 06/06/2020   Lab Results  Component Value Date   HDL 60 08/01/2022   HDL 68 06/02/2022   HDL 48 06/06/2020   Lab Results  Component Value Date   LDLCALC 72 08/01/2022   LDLCALC 77 06/02/2022   LDLCALC 88 06/06/2020   Lab Results  Component Value Date   TRIG 30 08/01/2022   TRIG 42 06/02/2022   TRIG 59 06/06/2020   Lab Results  Component Value Date   CHOLHDL 2.3 08/01/2022   CHOLHDL 2.3 06/02/2022   CHOLHDL 3.1 06/06/2020   No results found for: "LDLDIRECT"    Radiology: DG Chest 2 View  Result Date: 07/31/2022 CLINICAL DATA:  Shortness of breath. EXAM: CHEST - 2 VIEW COMPARISON:  Chest two views 06/01/2022, 12/08/2021; CT abdomen and pelvis 12/08/2021 FINDINGS: Cardiac silhouette and mediastinal contours are within normal limits. Mild calcification within the aortic arch. Moderate bilateral interstitial thickening is unchanged from multiple prior CTs and corresponds to the interlobular septal thickening and possible early honeycombing from interstitial lung disease seen within the lung bases on prior CT abdomen and pelvis. No new airspace opacity. No pleural effusion or pneumothorax. Mild multilevel degenerative disc changes of the thoracic spine. IMPRESSION: Moderate bilateral interstitial thickening is unchanged from multiple prior CTS and again corresponds to chronic interstitial lung disease. No acute lung process. Electronically Signed    By: Yvonne Kendall M.D.   On: 07/31/2022 08:37    EKG: Normal sinus rhythm rate of 90 left axis deviation right bundle branch block Q waves inferiorly consistent with probable old inferior infarct nonspecific ST-T wave changes  ASSESSMENT AND PLAN:  Shortness of breath Interstitial lung disease COPD Smoking Abnormal EKG Borderline troponins Obesity Chronic abdominal pain Pancreatitis secondary to alcohol abuse Hypertension Chronic renal insufficiency . Plan Prolonged troponins consistent with demand ischemia continue conservative management Continue respiratory support inhalers and supplemental oxygen as necessary No clear evidence of heart failure would limit diuretic therapy agree with echocardiogram for assessment of ventricular function wall motion Recommend weight loss exercise portion control for obesity Advised patient refrain from tobacco abuse Counseled patient on alcohol abuse Maintain antihypertensive medication Recommend referral to pulmonary for evaluation and treatment of interstitial lung disease Avoid nephrotoxic drugs with renal insufficiency Recommend conservative cardiac input at this point lifestyle modifications I think are more important Consider sleep study for evaluation of possible obstructive sleep apnea No invasive procedures planned from a cardiac standpoint  Signed: Yolonda Kida MD, 08/01/2022, 11:43 AM

## 2022-08-01 NOTE — Progress Notes (Signed)
Patient awaiting on transportation to disposition.

## 2022-08-17 DIAGNOSIS — M069 Rheumatoid arthritis, unspecified: Secondary | ICD-10-CM | POA: Diagnosis not present

## 2022-08-18 DIAGNOSIS — M069 Rheumatoid arthritis, unspecified: Secondary | ICD-10-CM | POA: Diagnosis not present

## 2022-08-19 DIAGNOSIS — M069 Rheumatoid arthritis, unspecified: Secondary | ICD-10-CM | POA: Diagnosis not present

## 2022-08-20 DIAGNOSIS — M069 Rheumatoid arthritis, unspecified: Secondary | ICD-10-CM | POA: Diagnosis not present

## 2022-08-21 DIAGNOSIS — M069 Rheumatoid arthritis, unspecified: Secondary | ICD-10-CM | POA: Diagnosis not present

## 2022-08-24 DIAGNOSIS — M069 Rheumatoid arthritis, unspecified: Secondary | ICD-10-CM | POA: Diagnosis not present

## 2022-08-25 DIAGNOSIS — M069 Rheumatoid arthritis, unspecified: Secondary | ICD-10-CM | POA: Diagnosis not present

## 2022-08-26 DIAGNOSIS — M069 Rheumatoid arthritis, unspecified: Secondary | ICD-10-CM | POA: Diagnosis not present

## 2022-08-27 DIAGNOSIS — M069 Rheumatoid arthritis, unspecified: Secondary | ICD-10-CM | POA: Diagnosis not present

## 2022-08-28 DIAGNOSIS — M069 Rheumatoid arthritis, unspecified: Secondary | ICD-10-CM | POA: Diagnosis not present

## 2022-08-31 DIAGNOSIS — M069 Rheumatoid arthritis, unspecified: Secondary | ICD-10-CM | POA: Diagnosis not present

## 2022-09-01 DIAGNOSIS — M069 Rheumatoid arthritis, unspecified: Secondary | ICD-10-CM | POA: Diagnosis not present

## 2022-09-02 ENCOUNTER — Other Ambulatory Visit: Payer: Self-pay | Admitting: Internal Medicine

## 2022-09-02 DIAGNOSIS — M069 Rheumatoid arthritis, unspecified: Secondary | ICD-10-CM | POA: Diagnosis not present

## 2022-09-02 NOTE — Telephone Encounter (Signed)
Requested medication (s) are due for refill today: yes  Requested medication (s) are on the active medication list: yes  Last refill:  08/01/22 #30/0  Future visit scheduled: no  Notes to clinic:  rx was prescribed by ED on 07/31/22. Please advise for refill.      Requested Prescriptions  Pending Prescriptions Disp Refills   sertraline (ZOLOFT) 100 MG tablet 30 tablet 0    Sig: Take 1 tablet (100 mg total) by mouth daily.     Psychiatry:  Antidepressants - SSRI - sertraline Failed - 09/02/2022 11:19 AM      Failed - Valid encounter within last 6 months    Recent Outpatient Visits           8 months ago 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[facility])   [facility], Coralie Keens, NP   8 months ago LUQ pain   [facility] [place], Coralie Keens, NP   11 months ago Shortness of breath   [facility] [place], Coralie Keens, NP   1 year ago [facility] [place], [place] W, NP   1 year ago Posttraumatic stress disorder   [facility] [place], [place] W, NP              Passed - AST in normal range and within 360 days    AST  Date Value Ref Range Status  08/01/2022 18 15 - 41 U/L Final         Passed - ALT in normal range and within 360 days    ALT  Date Value Ref Range Status  08/01/2022 10 0 - 44 U/L Final         Passed - Completed PHQ-2 or PHQ-9 in the last 360 days

## 2022-09-02 NOTE — Telephone Encounter (Signed)
Medication Refill - Medication: sertraline (ZOLOFT) 100 MG tablet [[id number]]    Has the patient contacted their pharmacy? No. (Agent: If no, request that the patient contact the pharmacy for the refill. If patient does not wish to contact the pharmacy document the reason why and proceed with request.) (Agent: If yes, when and what did the pharmacy advise?)  Preferred Pharmacy (with phone number or street name): [facility] [place], [place].  Has the patient been seen for an appointment in the last year OR does the patient have an upcoming appointment? Yes.    Agent: Please be advised that RX refills may take up to 3 business days. We ask that you follow-up with your pharmacy.

## 2022-09-03 DIAGNOSIS — M069 Rheumatoid arthritis, unspecified: Secondary | ICD-10-CM | POA: Diagnosis not present

## 2022-09-04 DIAGNOSIS — M069 Rheumatoid arthritis, unspecified: Secondary | ICD-10-CM | POA: Diagnosis not present

## 2022-09-07 DIAGNOSIS — M069 Rheumatoid arthritis, unspecified: Secondary | ICD-10-CM | POA: Diagnosis not present

## 2022-09-08 DIAGNOSIS — M069 Rheumatoid arthritis, unspecified: Secondary | ICD-10-CM | POA: Diagnosis not present

## 2022-09-09 DIAGNOSIS — M069 Rheumatoid arthritis, unspecified: Secondary | ICD-10-CM | POA: Diagnosis not present

## 2022-09-10 DIAGNOSIS — M069 Rheumatoid arthritis, unspecified: Secondary | ICD-10-CM | POA: Diagnosis not present

## 2022-09-11 ENCOUNTER — Other Ambulatory Visit (HOSPITAL_BASED_OUTPATIENT_CLINIC_OR_DEPARTMENT_OTHER): Payer: Self-pay | Admitting: Osteopathic Medicine

## 2022-09-11 DIAGNOSIS — F5104 Psychophysiologic insomnia: Secondary | ICD-10-CM

## 2022-09-11 DIAGNOSIS — M069 Rheumatoid arthritis, unspecified: Secondary | ICD-10-CM | POA: Diagnosis not present

## 2022-09-11 DIAGNOSIS — F4310 Post-traumatic stress disorder, unspecified: Secondary | ICD-10-CM

## 2022-09-14 DIAGNOSIS — M069 Rheumatoid arthritis, unspecified: Secondary | ICD-10-CM | POA: Diagnosis not present

## 2022-09-15 DIAGNOSIS — M069 Rheumatoid arthritis, unspecified: Secondary | ICD-10-CM | POA: Diagnosis not present

## 2022-09-16 DIAGNOSIS — M069 Rheumatoid arthritis, unspecified: Secondary | ICD-10-CM | POA: Diagnosis not present

## 2022-09-17 DIAGNOSIS — M069 Rheumatoid arthritis, unspecified: Secondary | ICD-10-CM | POA: Diagnosis not present

## 2022-09-18 DIAGNOSIS — M069 Rheumatoid arthritis, unspecified: Secondary | ICD-10-CM | POA: Diagnosis not present

## 2022-09-21 DIAGNOSIS — M069 Rheumatoid arthritis, unspecified: Secondary | ICD-10-CM | POA: Diagnosis not present

## 2022-09-22 DIAGNOSIS — M069 Rheumatoid arthritis, unspecified: Secondary | ICD-10-CM | POA: Diagnosis not present

## 2022-09-23 DIAGNOSIS — M069 Rheumatoid arthritis, unspecified: Secondary | ICD-10-CM | POA: Diagnosis not present

## 2022-09-24 DIAGNOSIS — M069 Rheumatoid arthritis, unspecified: Secondary | ICD-10-CM | POA: Diagnosis not present

## 2022-09-25 DIAGNOSIS — M069 Rheumatoid arthritis, unspecified: Secondary | ICD-10-CM | POA: Diagnosis not present

## 2022-09-28 DIAGNOSIS — M069 Rheumatoid arthritis, unspecified: Secondary | ICD-10-CM | POA: Diagnosis not present

## 2022-09-29 DIAGNOSIS — M069 Rheumatoid arthritis, unspecified: Secondary | ICD-10-CM | POA: Diagnosis not present

## 2022-09-30 DIAGNOSIS — M069 Rheumatoid arthritis, unspecified: Secondary | ICD-10-CM | POA: Diagnosis not present

## 2022-10-01 DIAGNOSIS — M069 Rheumatoid arthritis, unspecified: Secondary | ICD-10-CM | POA: Diagnosis not present

## 2022-10-02 ENCOUNTER — Other Ambulatory Visit: Payer: Self-pay | Admitting: Internal Medicine

## 2022-10-02 DIAGNOSIS — M069 Rheumatoid arthritis, unspecified: Secondary | ICD-10-CM | POA: Diagnosis not present

## 2022-10-02 NOTE — Telephone Encounter (Signed)
Requested medication (s) are due for refill today: yes  Requested medication (s) are on the active medication list: yes  Last refill:  08/01/22  Future visit scheduled: yes  Notes to clinic:  Unable to refill per protocol, last refill by another provider.      Requested Prescriptions  Pending Prescriptions Disp Refills   sertraline (ZOLOFT) 100 MG tablet [Pharmacy Med Name: SERTRALINE HCL 100 MG TAB] 30 tablet 0    Sig: TAKE 1 TABLET BY MOUTH ONCE DAILY *PLEASE MAKE APPOINTMENT FOR FURTHER FILLS*     Psychiatry:  Antidepressants - SSRI - sertraline Failed - 10/02/2022 12:15 PM      Failed - Valid encounter within last 6 months    Recent Outpatient Visits           9 months ago 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[facility])   [facility] [place], Coralie Keens, NP   9 months ago LUQ pain   [facility] [place], Coralie Keens, NP   1 year ago Shortness of breath   [facility] [place], Coralie Keens, NP   1 year ago [facility] [place], [place] W, NP   1 year ago Posttraumatic stress disorder   [facility] [place], [place] W, NP              Passed - AST in normal range and within 360 days    AST  Date Value Ref Range Status  08/01/2022 18 15 - 41 U/L Final         Passed - ALT in normal range and within 360 days    ALT  Date Value Ref Range Status  08/01/2022 10 0 - 44 U/L Final         Passed - Completed PHQ-2 or PHQ-9 in the last 360 days

## 2022-10-05 DIAGNOSIS — M069 Rheumatoid arthritis, unspecified: Secondary | ICD-10-CM | POA: Diagnosis not present

## 2022-10-06 DIAGNOSIS — M069 Rheumatoid arthritis, unspecified: Secondary | ICD-10-CM | POA: Diagnosis not present

## 2022-10-07 DIAGNOSIS — M069 Rheumatoid arthritis, unspecified: Secondary | ICD-10-CM | POA: Diagnosis not present

## 2022-10-08 DIAGNOSIS — M069 Rheumatoid arthritis, unspecified: Secondary | ICD-10-CM | POA: Diagnosis not present

## 2022-10-09 DIAGNOSIS — M069 Rheumatoid arthritis, unspecified: Secondary | ICD-10-CM | POA: Diagnosis not present

## 2022-10-12 DIAGNOSIS — M069 Rheumatoid arthritis, unspecified: Secondary | ICD-10-CM | POA: Diagnosis not present

## 2022-10-13 DIAGNOSIS — M069 Rheumatoid arthritis, unspecified: Secondary | ICD-10-CM | POA: Diagnosis not present

## 2022-10-14 DIAGNOSIS — M069 Rheumatoid arthritis, unspecified: Secondary | ICD-10-CM | POA: Diagnosis not present

## 2022-10-15 DIAGNOSIS — M069 Rheumatoid arthritis, unspecified: Secondary | ICD-10-CM | POA: Diagnosis not present

## 2022-10-16 DIAGNOSIS — M069 Rheumatoid arthritis, unspecified: Secondary | ICD-10-CM | POA: Diagnosis not present

## 2022-10-19 DIAGNOSIS — M069 Rheumatoid arthritis, unspecified: Secondary | ICD-10-CM | POA: Diagnosis not present

## 2022-10-20 DIAGNOSIS — M069 Rheumatoid arthritis, unspecified: Secondary | ICD-10-CM | POA: Diagnosis not present

## 2022-10-21 DIAGNOSIS — M069 Rheumatoid arthritis, unspecified: Secondary | ICD-10-CM | POA: Diagnosis not present

## 2022-10-22 DIAGNOSIS — M069 Rheumatoid arthritis, unspecified: Secondary | ICD-10-CM | POA: Diagnosis not present

## 2022-10-23 DIAGNOSIS — M069 Rheumatoid arthritis, unspecified: Secondary | ICD-10-CM | POA: Diagnosis not present

## 2022-10-23 IMAGING — MR MR ABDOMEN WO/W CM
18 of 22 series · 42 of 48 positions shown · IV contrast (gadavist)
Comparison: CT abdomen pelvis, 12/08/2021

CLINICAL DATA: Pancreatic cystic lesion, chronic pancreatitis

EXAM:
MRI ABDOMEN WITHOUT AND WITH CONTRAST
TECHNIQUE: Multiplanar multisequence MR imaging of the abdomen was performed
both before and after the administration of intravenous contrast.
CONTRAST:  7mL GADAVIST GADOBUTROL 1 MMOL/ML IV SOLN

[Series 4: T2 · axial · 6.0mm · 1.19mm/px · 1 of 32 slices shown]
[im 1/32]
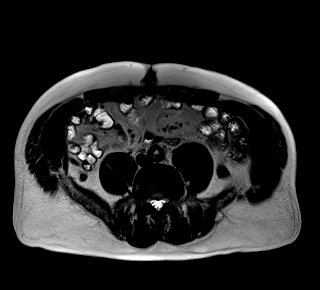

[Series 5: T1 · axial · 3.0mm · 1.19mm/px · z∈[-53,+159]mm · 2 of 72 slices shown (1 of 2)]
[im 1/72]
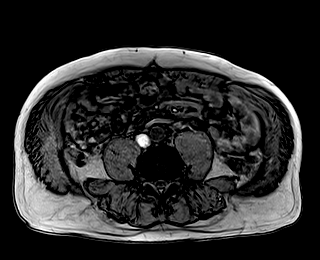
[im 72/72]
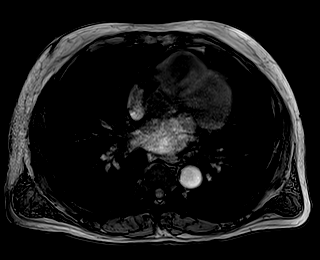

[Series 6: T1 · axial · 3.0mm · 1.19mm/px · z∈[-53,+159]mm · 2 of 72 slices shown (2 of 2)]
[im 1/72]
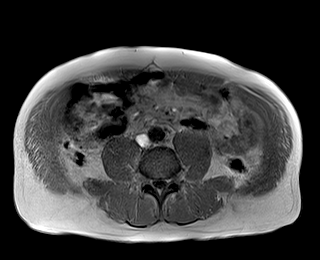
[im 72/72]
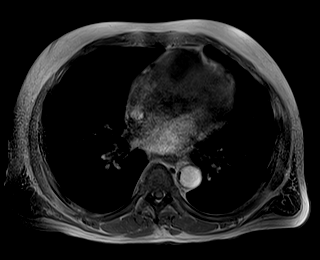

[Series 7: bSSFP · axial · 6.0mm · 0.74mm/px · 1 of 32 slices shown]
[im 1/32]
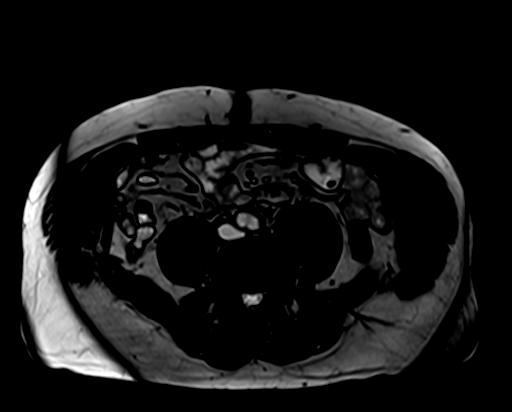

[Series 8: ax dwi_tracew · axial · 6.0mm · 1.42mm/px · z∈[-64,+144]mm · 3 of 90 slices shown]
[im 1/90]
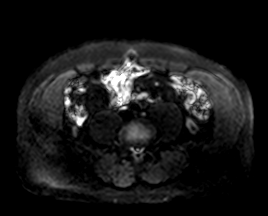
[im 45/90]
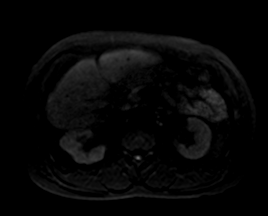
[im 90/90]
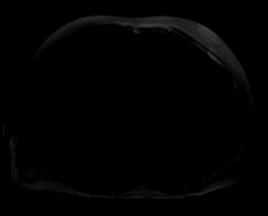

[Series 9: ax dwi_adc · axial · 6.0mm · 1.42mm/px · 1 of 30 slices shown]
[im 1/30]
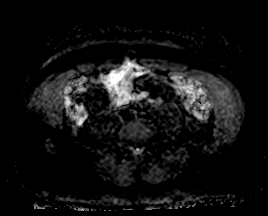

[Series 12: T2 fat-sat · axial · 6.5mm · 1.19mm/px · 1 of 30 slices shown]
[im 1/30]
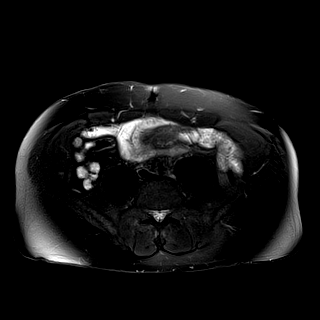

[Series 17: MRCP · coronal · 3.5mm · 1.12mm/px · 1 of 17 slices shown]
[im 1/17]
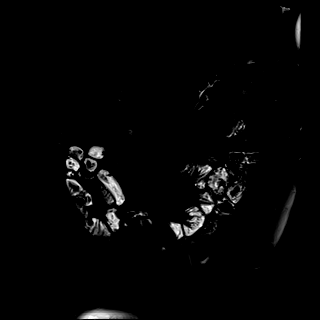

[Series 18: T1 dynamic fat-sat · axial · non-contrast · 3.0mm · 1.19mm/px · z∈[-67,+146]mm · 3 of 72 slices shown (1 of 5)]
[im 1/72]
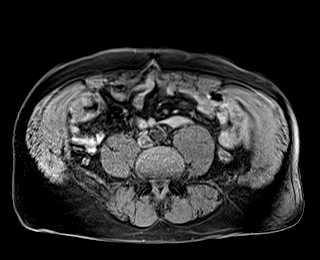
[im 36/72]
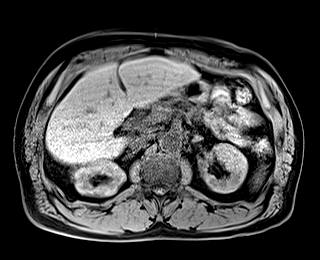
[im 72/72]
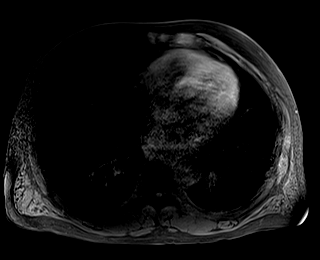

[Series 19: T1 dynamic fat-sat post-contrast · axial · 3.0mm · 1.19mm/px · z∈[-67,+146]mm · 3 of 72 slices shown (1 of 4)]
[im 1/72]
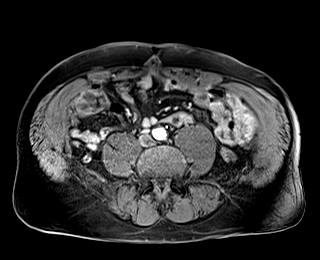
[im 36/72]
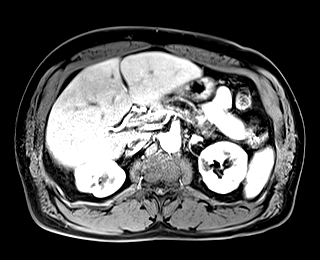
[im 72/72]
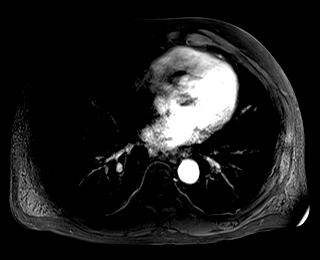

[Series 20: T1 dynamic fat-sat · axial · 3.0mm · 1.19mm/px · z∈[-67,+146]mm · 3 of 72 slices shown (2 of 5)]
[im 1/72]
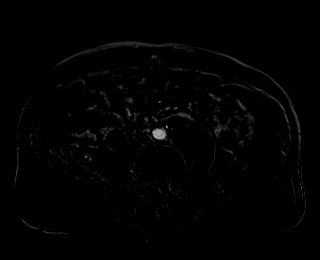
[im 36/72]
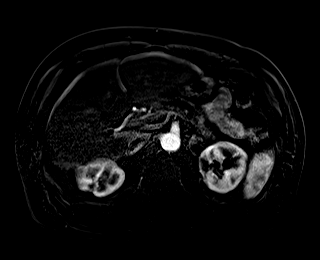
[im 72/72]
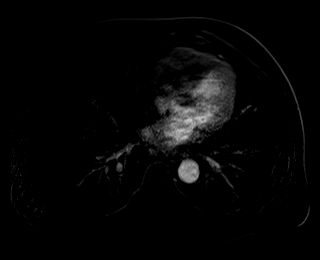

[Series 21: T1 dynamic fat-sat post-contrast · axial · 3.0mm · 1.19mm/px · z∈[-67,+146]mm · 3 of 72 slices shown (2 of 4)]
[im 1/72]
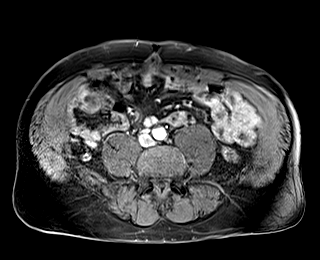
[im 36/72]
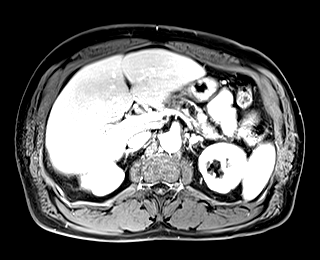
[im 72/72]
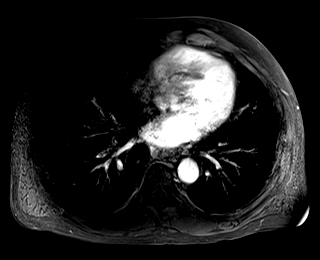

[Series 22: T1 dynamic fat-sat · axial · 3.0mm · 1.19mm/px · z∈[-67,+146]mm · 3 of 72 slices shown (3 of 5)]
[im 1/72]
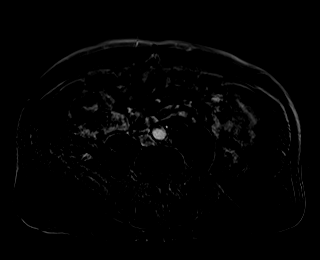
[im 36/72]
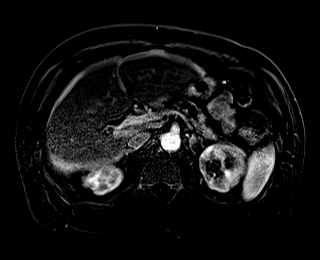
[im 72/72]
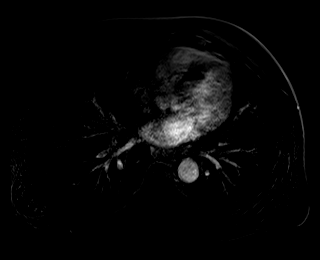

[Series 23: T1 dynamic fat-sat post-contrast · axial · 3.0mm · 1.19mm/px · z∈[-67,+146]mm · 3 of 72 slices shown (3 of 4)]
[im 1/72]
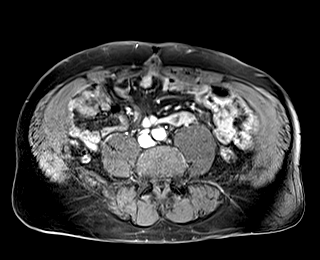
[im 36/72]
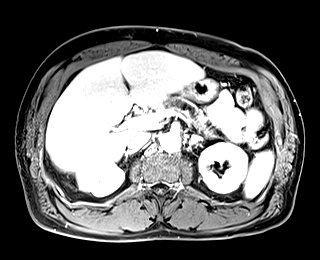
[im 72/72]
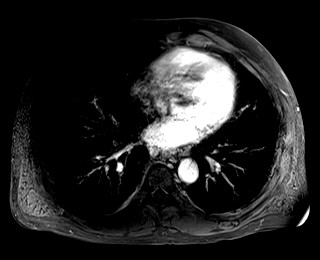

[Series 24: T1 dynamic fat-sat · axial · 3.0mm · 1.19mm/px · z∈[-67,+146]mm · 3 of 72 slices shown (4 of 5)]
[im 1/72]
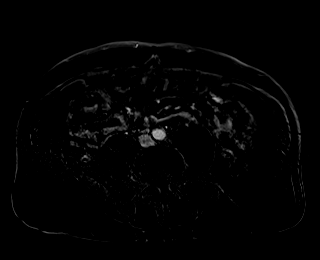
[im 36/72]
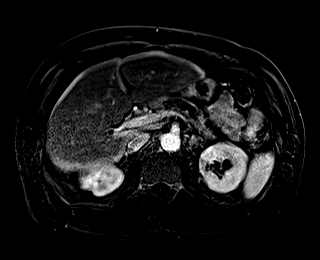
[im 72/72]
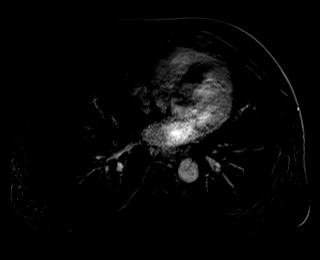

[Series 25: T1 dynamic post-contrast · coronal · 3.0mm · 1.31mm/px · 3 of 72 slices shown]
[im 1/72]
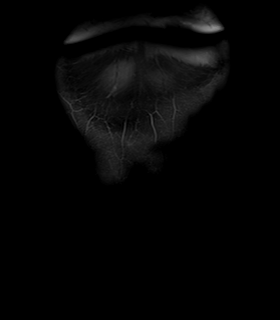
[im 36/72]
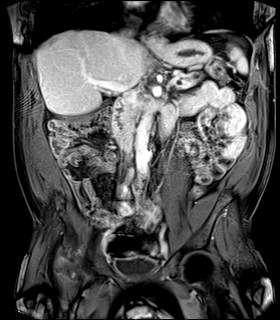
[im 72/72]
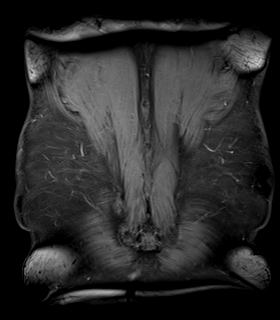

[Series 26: T1 dynamic fat-sat post-contrast · axial · 3.0mm · 1.19mm/px · z∈[-67,+146]mm · 3 of 72 slices shown (4 of 4)]
[im 1/72]
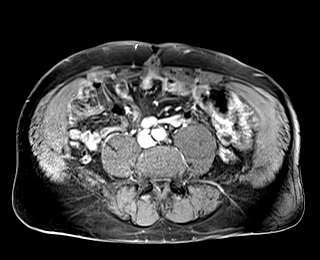
[im 36/72]
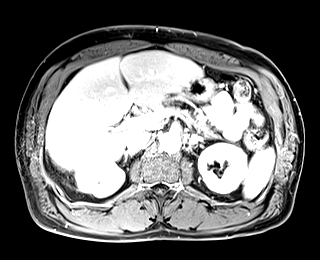
[im 72/72]
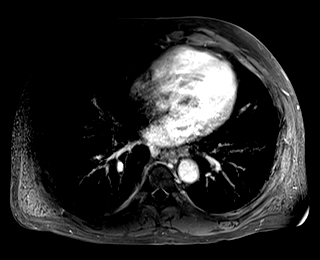

[Series 27: T1 dynamic fat-sat · axial · 3.0mm · 1.19mm/px · z∈[-67,+146]mm · 3 of 72 slices shown (5 of 5)]
[im 1/72]
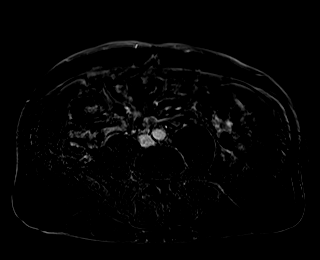
[im 36/72]
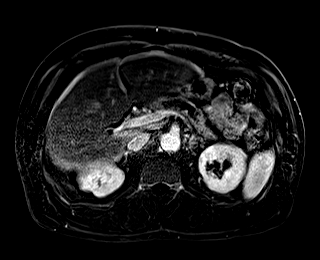
[im 72/72]
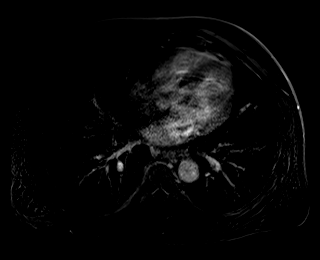

[42 of 48 positions shown; findings below may reference images not displayed]

FINDINGS: Lower chest: No acute findings.

Hepatobiliary: No mass or other parenchymal abnormality identified.
No gallstones. No biliary ductal dilatation.

Pancreas: Stigmata of chronic pancreatitis with extensive
microcystic change of the pancreatic parenchyma and susceptibility
artifact related to calcification, this appearance in keeping with
prior CT findings. A previously noted cystic lesion of the
pancreatic uncinate is not present on today's examination. No mass,
inflammatory changes, or other parenchymal abnormality identified.No
pancreatic ductal dilatation.

Spleen:  Within normal limits in size and appearance.

Adrenals/Urinary Tract: Normal adrenal glands. Lobulated scarring of
the bilateral renal cortices, in keeping with prior infectious or
obstructive insult. No renal masses or suspicious contrast
enhancement identified. No evidence of hydronephrosis.

Stomach/Bowel: Visualized portions within the abdomen are
unremarkable.

Vascular/Lymphatic: No pathologically enlarged lymph nodes
identified. No abdominal aortic aneurysm demonstrated. Aortic
atherosclerosis.

Other:  None.

Musculoskeletal: No suspicious osseous lesions identified.
IMPRESSION: 1. A previously noted cystic lesion of the pancreatic uncinate is
not present on today's examination, presumably reflecting a since
resolved small pseudocyst. No mass, inflammatory changes, or other
parenchymal abnormality identified.
2. Stigmata of chronic pancreatitis with extensive microcystic
change of the pancreatic parenchyma and susceptibility artifact
related to calcification, this appearance in keeping with prior CT
findings.
3. No gallstones or biliary ductal dilatation.

Aortic Atherosclerosis (VNH6O-LTS.S).

## 2022-10-26 DIAGNOSIS — M069 Rheumatoid arthritis, unspecified: Secondary | ICD-10-CM | POA: Diagnosis not present

## 2022-10-27 DIAGNOSIS — M069 Rheumatoid arthritis, unspecified: Secondary | ICD-10-CM | POA: Diagnosis not present

## 2022-10-28 DIAGNOSIS — M069 Rheumatoid arthritis, unspecified: Secondary | ICD-10-CM | POA: Diagnosis not present

## 2022-10-29 DIAGNOSIS — M069 Rheumatoid arthritis, unspecified: Secondary | ICD-10-CM | POA: Diagnosis not present

## 2022-10-30 DIAGNOSIS — M069 Rheumatoid arthritis, unspecified: Secondary | ICD-10-CM | POA: Diagnosis not present

## 2022-11-02 DIAGNOSIS — M069 Rheumatoid arthritis, unspecified: Secondary | ICD-10-CM | POA: Diagnosis not present

## 2022-11-03 ENCOUNTER — Other Ambulatory Visit: Payer: Self-pay | Admitting: Internal Medicine

## 2022-11-03 DIAGNOSIS — M069 Rheumatoid arthritis, unspecified: Secondary | ICD-10-CM | POA: Diagnosis not present

## 2022-11-04 DIAGNOSIS — M069 Rheumatoid arthritis, unspecified: Secondary | ICD-10-CM | POA: Diagnosis not present

## 2022-11-04 NOTE — Telephone Encounter (Signed)
Requested medication (s) are due for refill today: yes  Requested medication (s) are on the active medication list: yes  Last refill:  08/01/22  Future visit scheduled: no  Notes to clinic:  Unable to refill per protocol, last refill by another provider.  Medication was prescribed in the ED, routing for approval.     Requested Prescriptions  Pending Prescriptions Disp Refills   sertraline (ZOLOFT) 100 MG tablet [Pharmacy Med Name: SERTRALINE HCL 100 MG TAB] 30 tablet 0    Sig: TAKE 1 TABLET BY MOUTH ONCE DAILY *PLEASE MAKE APPOINTMENT FOR FURTHER FILLS*     Psychiatry:  Antidepressants - SSRI - sertraline Failed - 11/03/2022  2:36 PM      Failed - Valid encounter within last 6 months    Recent Outpatient Visits           10 months ago 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[facility])   [facility] [place], Noah Keens, NP   10 months ago LUQ pain   [facility] [place], Noah Keens, NP   1 year ago Shortness of breath   [facility] [place], Noah Keens, NP   1 year ago [facility] [place], [place] W, NP   1 year ago Posttraumatic stress disorder   [facility] [place], [place] W, NP              Passed - AST in normal range and within 360 days    AST  Date Value Ref Range Status  08/01/2022 18 15 - 41 U/L Final         Passed - ALT in normal range and within 360 days    ALT  Date Value Ref Range Status  08/01/2022 10 0 - 44 U/L Final         Passed - Completed PHQ-2 or PHQ-9 in the last 360 days

## 2022-11-05 DIAGNOSIS — M069 Rheumatoid arthritis, unspecified: Secondary | ICD-10-CM | POA: Diagnosis not present

## 2022-11-06 DIAGNOSIS — M069 Rheumatoid arthritis, unspecified: Secondary | ICD-10-CM | POA: Diagnosis not present

## 2022-11-09 DIAGNOSIS — M069 Rheumatoid arthritis, unspecified: Secondary | ICD-10-CM | POA: Diagnosis not present

## 2022-11-10 DIAGNOSIS — M069 Rheumatoid arthritis, unspecified: Secondary | ICD-10-CM | POA: Diagnosis not present

## 2022-11-11 DIAGNOSIS — M069 Rheumatoid arthritis, unspecified: Secondary | ICD-10-CM | POA: Diagnosis not present

## 2022-11-12 DIAGNOSIS — M069 Rheumatoid arthritis, unspecified: Secondary | ICD-10-CM | POA: Diagnosis not present

## 2022-11-13 DIAGNOSIS — M069 Rheumatoid arthritis, unspecified: Secondary | ICD-10-CM | POA: Diagnosis not present

## 2022-11-16 DIAGNOSIS — M069 Rheumatoid arthritis, unspecified: Secondary | ICD-10-CM | POA: Diagnosis not present

## 2022-11-17 DIAGNOSIS — M069 Rheumatoid arthritis, unspecified: Secondary | ICD-10-CM | POA: Diagnosis not present

## 2022-11-18 DIAGNOSIS — M069 Rheumatoid arthritis, unspecified: Secondary | ICD-10-CM | POA: Diagnosis not present

## 2022-11-19 DIAGNOSIS — M069 Rheumatoid arthritis, unspecified: Secondary | ICD-10-CM | POA: Diagnosis not present

## 2022-11-20 DIAGNOSIS — M069 Rheumatoid arthritis, unspecified: Secondary | ICD-10-CM | POA: Diagnosis not present

## 2022-11-23 DIAGNOSIS — M069 Rheumatoid arthritis, unspecified: Secondary | ICD-10-CM | POA: Diagnosis not present

## 2022-11-24 DIAGNOSIS — M069 Rheumatoid arthritis, unspecified: Secondary | ICD-10-CM | POA: Diagnosis not present

## 2022-11-25 DIAGNOSIS — M069 Rheumatoid arthritis, unspecified: Secondary | ICD-10-CM | POA: Diagnosis not present

## 2022-11-26 DIAGNOSIS — M069 Rheumatoid arthritis, unspecified: Secondary | ICD-10-CM | POA: Diagnosis not present

## 2022-11-27 DIAGNOSIS — M069 Rheumatoid arthritis, unspecified: Secondary | ICD-10-CM | POA: Diagnosis not present

## 2022-11-30 DIAGNOSIS — M069 Rheumatoid arthritis, unspecified: Secondary | ICD-10-CM | POA: Diagnosis not present

## 2022-12-01 DIAGNOSIS — M069 Rheumatoid arthritis, unspecified: Secondary | ICD-10-CM | POA: Diagnosis not present

## 2022-12-02 DIAGNOSIS — M069 Rheumatoid arthritis, unspecified: Secondary | ICD-10-CM | POA: Diagnosis not present

## 2022-12-03 DIAGNOSIS — M069 Rheumatoid arthritis, unspecified: Secondary | ICD-10-CM | POA: Diagnosis not present

## 2022-12-04 DIAGNOSIS — M069 Rheumatoid arthritis, unspecified: Secondary | ICD-10-CM | POA: Diagnosis not present

## 2022-12-07 DIAGNOSIS — M069 Rheumatoid arthritis, unspecified: Secondary | ICD-10-CM | POA: Diagnosis not present

## 2022-12-08 DIAGNOSIS — M069 Rheumatoid arthritis, unspecified: Secondary | ICD-10-CM | POA: Diagnosis not present

## 2022-12-09 DIAGNOSIS — M069 Rheumatoid arthritis, unspecified: Secondary | ICD-10-CM | POA: Diagnosis not present

## 2022-12-10 ENCOUNTER — Other Ambulatory Visit: Payer: Self-pay | Admitting: Internal Medicine

## 2022-12-10 DIAGNOSIS — M069 Rheumatoid arthritis, unspecified: Secondary | ICD-10-CM | POA: Diagnosis not present

## 2022-12-10 DIAGNOSIS — H0520 Unspecified exophthalmos: Secondary | ICD-10-CM

## 2022-12-10 NOTE — Telephone Encounter (Signed)
Requested medications are due for refill today.  Yes  Requested medications are on the active medications list.  Yes - Naproxen 500 is not  Last refill. 08/01/2022  Future visit scheduled.   no  Notes to clinic.  Please review for refill.    Requested Prescriptions  Pending Prescriptions Disp Refills   amLODipine (NORVASC) 10 MG tablet [Pharmacy Med Name: AMLODIPINE BESYLATE 10 MG TAB] 30 tablet 2    Sig: TAKE 1 TABLET BY MOUTH ONCE DAILY     Cardiovascular: Calcium Channel Blockers 2 Failed - 12/10/2022  1:41 PM      Failed - Last BP in normal range    BP Readings from Last 1 Encounters:  08/01/22 (!) 157/89         Failed - Valid encounter within last 6 months    Recent Outpatient Visits           11 months ago 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[facility])   [facility] [place], Salvadore Oxford, NP   1 year ago LUQ pain   [facility] [place], Salvadore Oxford, NP   1 year ago Shortness of breath   [facility] [place], Salvadore Oxford, NP   1 year ago Exophthalmus   [facility] [place], Salvadore Oxford, NP   1 year ago Posttraumatic stress disorder   [place] [facility] [place], [place] W, NP              Passed - Last Heart Rate in normal range    Pulse Readings from Last 1 Encounters:  08/01/22 93          omeprazole (PRILOSEC) 20 MG capsule [Pharmacy Med Name: OMEPRAZOLE DR 20 MG CAP] 30 capsule 0    Sig: TAKE 1 CAPSULE BY MOUTH ONCE DAILY     Gastroenterology: Proton Pump Inhibitors Passed - 12/10/2022  1:41 PM      Passed - Valid encounter within last 12 months    Recent Outpatient Visits           11 months ago 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[facility])   [place] [facility] [place], Salvadore Oxford, NP   1 year ago LUQ pain   [place] [facility] [place], Salvadore Oxford, NP   1 year ago Shortness of breath   [place] [facility] [place], Salvadore Oxford, NP   1 year ago Exophthalmus   [place] [facility] [place], Salvadore Oxford, NP   1 year ago Posttraumatic stress disorder    [facility] [place], [place] W, NP               naproxen (NAPROSYN) 500 MG tablet [Pharmacy Med Name: NAPROXEN 500 MG TAB] 30 tablet     Sig: TAKE 1 TABLET BY MOUTH TWICE DAILY AS NEEDED FOR MILD OR MODERATE PAIN     Analgesics:  NSAIDS Failed - 12/10/2022  1:41 PM      Failed - Manual Review: Labs are only required if the patient has taken medication for more than 8 weeks.      Passed - Cr in normal range and within 360 days    Creat  Date Value Ref Range Status  12/10/2021 1.20 0.70 - 1.30 mg/dL Final   Creatinine, Ser  Date Value Ref Range Status  08/01/2022 1.01 0.61 - 1.24 mg/dL Final  Passed - HGB in normal range and within 360 days    Hemoglobin  Date Value Ref Range Status  08/01/2022 13.4 13.0 - 17.0 g/dL Final         Passed - PLT in normal range and within 360 days    Platelets  Date Value Ref Range Status  08/01/2022 355 150 - 400 K/uL Final         Passed - HCT in normal range and within 360 days    HCT  Date Value Ref Range Status  08/01/2022 40.7 39.0 - 52.0 % Final         Passed - eGFR is 30 or above and within 360 days    GFR, Est African American  Date Value Ref Range Status  06/06/2020 74 > OR = 60 mL/min/1.70m2 Final   GFR, Est Non African American  Date Value Ref Range Status  06/06/2020 64 > OR = 60 mL/min/1.2m2 Final   GFR, Estimated  Date Value Ref Range Status  08/01/2022 >60 >60 mL/min Final    Comment:    (NOTE) Calculated using the CKD-EPI Creatinine Equation (2021)          Passed - Patient is not pregnant      Passed - Valid encounter within last 12 months    Recent Outpatient Visits           11 months ago 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[facility])   [facility] [place], Salvadore Oxford, NP   1  year ago LUQ pain   [facility] [place], Salvadore Oxford, NP   1 year ago Shortness of breath   [facility] [place], Salvadore Oxford, NP   1 year ago Exophthalmus   [place] [place], Salvadore Oxford, NP   1 year ago Posttraumatic stress disorder   [place] [facility] [place], Salvadore [place], [place]

## 2022-12-11 ENCOUNTER — Other Ambulatory Visit: Payer: Self-pay | Admitting: Internal Medicine

## 2022-12-11 DIAGNOSIS — M069 Rheumatoid arthritis, unspecified: Secondary | ICD-10-CM | POA: Diagnosis not present

## 2022-12-11 DIAGNOSIS — H0520 Unspecified exophthalmos: Secondary | ICD-10-CM

## 2022-12-14 DIAGNOSIS — M069 Rheumatoid arthritis, unspecified: Secondary | ICD-10-CM | POA: Diagnosis not present

## 2022-12-14 NOTE — Telephone Encounter (Signed)
Requested medication (s) are due for refill today: Yes  Requested medication (s) are on the active medication list: Yes  Last refill:  08/01/22  Future visit scheduled: No - needs appointment.2  Notes to clinic:  Last filled by Dr. Tyson Babinski.    Requested Prescriptions  Pending Prescriptions Disp Refills   amLODipine (NORVASC) 10 MG tablet [Pharmacy Med Name: AMLODIPINE BESYLATE 10 MG TAB] 30 tablet 2    Sig: TAKE 1 TABLET BY MOUTH ONCE DAILY     Cardiovascular: Calcium Channel Blockers 2 Failed - 12/11/2022  6:32 PM      Failed - Last BP in normal range    BP Readings from Last 1 Encounters:  08/01/22 (!) 157/89         Failed - Valid encounter within last 6 months    Recent Outpatient Visits           11 months ago 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[facility])   [place] [facility] [place], Salvadore Oxford, NP   1 year ago LUQ pain   [facility] [place], Salvadore Oxford, NP   1 year ago Shortness of breath   [place] [facility] [place], Salvadore Oxford, NP   1 year ago Exophthalmus   [place] [facility] [place], Salvadore Oxford, NP   1 year ago Posttraumatic stress disorder   [place] [facility] [place], [place], NP              Passed - Last Heart Rate in normal range    Pulse Readings from Last 1 Encounters:  08/01/22 93          omeprazole (PRILOSEC) 20 MG capsule [Pharmacy Med Name: OMEPRAZOLE DR 20 MG CAP] 30 capsule 0    Sig: TAKE 1 CAPSULE BY MOUTH ONCE DAILY     Gastroenterology: Proton Pump Inhibitors Passed - 12/11/2022  6:32 PM      Passed - Valid encounter within last 12 months    Recent Outpatient Visits           11 months ago 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[facility])   [place] [facility] [name], Salvadore Oxford, NP   1 year ago LUQ pain   [place] [facility] [place], Salvadore Oxford, NP   1 year ago Shortness of breath   [place] [facility] [place], Salvadore Oxford, NP   1 year ago Exophthalmus   [place] [place], Salvadore Oxford, NP   1 year ago Posttraumatic stress disorder   [place] [facility] [place], [place], NP               sertraline (ZOLOFT) 100 MG tablet [Pharmacy Med Name: SERTRALINE HCL 100 MG TAB] 30 tablet 0    Sig: TAKE 1 TABLET BY MOUTH ONCE DAILY *PLEASE MAKE APPOINTMENT FOR FURTHER FILLS*     Psychiatry:  Antidepressants - SSRI - sertraline Failed - 12/11/2022  6:32 PM      Failed - Valid encounter within last 6 months    Recent Outpatient Visits           11 months ago 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[facility])   [place] [facility] [place], Salvadore Oxford, NP   1 year ago LUQ pain   [place] [facility] [place], Salvadore Oxford, NP   1 year ago Shortness of breath  [place] [facility] [place], Salvadore Oxford, NP   1 year ago Exophthalmus   [place] [facility] [place], [place], NP   1 year ago Posttraumatic stress disorder   [place] [facility] [place], [place], [place]              Passed - AST in normal range and within 360 days    AST  Date Value Ref Range Status  08/01/2022 18 15 - 41 U/L Final         Passed - ALT in normal range and within 360 days    ALT  Date Value Ref Range Status  08/01/2022 10 0 - 44 U/L Final         Passed - Completed PHQ-2 or PHQ-9 in the last 360 days

## 2022-12-15 DIAGNOSIS — M069 Rheumatoid arthritis, unspecified: Secondary | ICD-10-CM | POA: Diagnosis not present

## 2022-12-16 DIAGNOSIS — M069 Rheumatoid arthritis, unspecified: Secondary | ICD-10-CM | POA: Diagnosis not present

## 2022-12-17 DIAGNOSIS — M069 Rheumatoid arthritis, unspecified: Secondary | ICD-10-CM | POA: Diagnosis not present

## 2022-12-18 DIAGNOSIS — M069 Rheumatoid arthritis, unspecified: Secondary | ICD-10-CM | POA: Diagnosis not present

## 2022-12-21 DIAGNOSIS — M069 Rheumatoid arthritis, unspecified: Secondary | ICD-10-CM | POA: Diagnosis not present

## 2022-12-22 DIAGNOSIS — M069 Rheumatoid arthritis, unspecified: Secondary | ICD-10-CM | POA: Diagnosis not present

## 2022-12-23 DIAGNOSIS — M069 Rheumatoid arthritis, unspecified: Secondary | ICD-10-CM | POA: Diagnosis not present

## 2022-12-24 DIAGNOSIS — M069 Rheumatoid arthritis, unspecified: Secondary | ICD-10-CM | POA: Diagnosis not present

## 2022-12-25 DIAGNOSIS — M069 Rheumatoid arthritis, unspecified: Secondary | ICD-10-CM | POA: Diagnosis not present

## 2022-12-28 DIAGNOSIS — M069 Rheumatoid arthritis, unspecified: Secondary | ICD-10-CM | POA: Diagnosis not present

## 2022-12-29 DIAGNOSIS — M069 Rheumatoid arthritis, unspecified: Secondary | ICD-10-CM | POA: Diagnosis not present

## 2022-12-30 DIAGNOSIS — M069 Rheumatoid arthritis, unspecified: Secondary | ICD-10-CM | POA: Diagnosis not present

## 2022-12-31 DIAGNOSIS — M069 Rheumatoid arthritis, unspecified: Secondary | ICD-10-CM | POA: Diagnosis not present

## 2023-01-01 DIAGNOSIS — M069 Rheumatoid arthritis, unspecified: Secondary | ICD-10-CM | POA: Diagnosis not present

## 2023-01-04 DIAGNOSIS — M069 Rheumatoid arthritis, unspecified: Secondary | ICD-10-CM | POA: Diagnosis not present

## 2023-01-05 DIAGNOSIS — M069 Rheumatoid arthritis, unspecified: Secondary | ICD-10-CM | POA: Diagnosis not present

## 2023-01-06 DIAGNOSIS — M069 Rheumatoid arthritis, unspecified: Secondary | ICD-10-CM | POA: Diagnosis not present

## 2023-01-07 DIAGNOSIS — M069 Rheumatoid arthritis, unspecified: Secondary | ICD-10-CM | POA: Diagnosis not present

## 2023-01-08 DIAGNOSIS — M069 Rheumatoid arthritis, unspecified: Secondary | ICD-10-CM | POA: Diagnosis not present

## 2023-01-11 DIAGNOSIS — M069 Rheumatoid arthritis, unspecified: Secondary | ICD-10-CM | POA: Diagnosis not present

## 2023-01-12 DIAGNOSIS — M069 Rheumatoid arthritis, unspecified: Secondary | ICD-10-CM | POA: Diagnosis not present

## 2023-01-13 DIAGNOSIS — M069 Rheumatoid arthritis, unspecified: Secondary | ICD-10-CM | POA: Diagnosis not present

## 2023-01-14 DIAGNOSIS — M069 Rheumatoid arthritis, unspecified: Secondary | ICD-10-CM | POA: Diagnosis not present

## 2023-01-15 DIAGNOSIS — M069 Rheumatoid arthritis, unspecified: Secondary | ICD-10-CM | POA: Diagnosis not present

## 2023-01-18 DIAGNOSIS — M069 Rheumatoid arthritis, unspecified: Secondary | ICD-10-CM | POA: Diagnosis not present

## 2023-01-19 DIAGNOSIS — M069 Rheumatoid arthritis, unspecified: Secondary | ICD-10-CM | POA: Diagnosis not present

## 2023-01-20 DIAGNOSIS — M069 Rheumatoid arthritis, unspecified: Secondary | ICD-10-CM | POA: Diagnosis not present

## 2023-01-21 DIAGNOSIS — M069 Rheumatoid arthritis, unspecified: Secondary | ICD-10-CM | POA: Diagnosis not present

## 2023-01-22 DIAGNOSIS — M069 Rheumatoid arthritis, unspecified: Secondary | ICD-10-CM | POA: Diagnosis not present

## 2023-01-25 DIAGNOSIS — M069 Rheumatoid arthritis, unspecified: Secondary | ICD-10-CM | POA: Diagnosis not present

## 2023-01-26 DIAGNOSIS — M069 Rheumatoid arthritis, unspecified: Secondary | ICD-10-CM | POA: Diagnosis not present

## 2023-01-28 ENCOUNTER — Other Ambulatory Visit: Payer: Self-pay | Admitting: Internal Medicine

## 2023-01-28 DIAGNOSIS — H0520 Unspecified exophthalmos: Secondary | ICD-10-CM

## 2023-01-28 NOTE — Telephone Encounter (Signed)
Appointment 02/02/23- courtesy 30 day Rx given Requested Prescriptions  Pending Prescriptions Disp Refills   omeprazole (PRILOSEC) 20 MG capsule [Pharmacy Med Name: OMEPRAZOLE DR 20 MG CAP] 30 capsule 0    Sig: TAKE 1 CAPSULE BY MOUTH ONCE DAILY     Gastroenterology: Proton Pump Inhibitors Failed - 01/28/2023 10:05 AM      Failed - Valid encounter within last 12 months    Recent Outpatient Visits           1 year ago 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[facility])   [place] [facility] [place], Salvadore Oxford, NP   1 year ago LUQ pain   [place] [facility] [place], Salvadore Oxford, NP   1 year ago Shortness of breath   [place] [place], Salvadore Oxford, NP   1 year ago Exophthalmus   [place] [facility] [place], Salvadore Oxford, NP   2 years ago Posttraumatic stress disorder   [facility] [place], Salvadore Oxford, NP       Future Appointments             In 5 days [place], Salvadore Oxford, NP [place] [facility]             amLODipine (NORVASC) 10 MG tablet [Pharmacy Med Name: AMLODIPINE BESYLATE 10 MG TAB] 30 tablet 2    Sig: TAKE 1 TABLET BY MOUTH ONCE DAILY     Cardiovascular: Calcium Channel Blockers 2 Failed - 01/28/2023 10:05 AM      Failed - Last BP in normal range    BP Readings from Last 1 Encounters:  08/01/22 (!) 157/89         Failed - Valid encounter within last 6 months    Recent Outpatient Visits           1 year ago 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[facility])   [place] [facility] [place], Salvadore Oxford, NP   1 year ago LUQ pain   [place] [facility] [place], Salvadore Oxford, NP   1 year ago Shortness of breath   [place] [facility] [place], Salvadore Oxford, NP   1 year ago Exophthalmus   [place] [facility] [place], Salvadore Oxford, NP   2 years ago Posttraumatic stress disorder   [place] [place], Salvadore Oxford, NP       Future Appointments             In 5 days [place], Salvadore Oxford, NP [place] [facility], [facility]            Passed - Last Heart Rate in normal range    Pulse Readings from Last 1 Encounters:  08/01/22 93

## 2023-02-02 ENCOUNTER — Ambulatory Visit (INDEPENDENT_AMBULATORY_CARE_PROVIDER_SITE_OTHER): Payer: Medicaid Other | Admitting: Internal Medicine

## 2023-02-02 ENCOUNTER — Encounter: Payer: Self-pay | Admitting: Internal Medicine

## 2023-02-02 VITALS — BP 152/94 | HR 78 | Temp 96.9°F | Wt 169.0 lb

## 2023-02-02 DIAGNOSIS — H0520 Unspecified exophthalmos: Secondary | ICD-10-CM

## 2023-02-02 DIAGNOSIS — K219 Gastro-esophageal reflux disease without esophagitis: Secondary | ICD-10-CM | POA: Diagnosis not present

## 2023-02-02 DIAGNOSIS — I10 Essential (primary) hypertension: Secondary | ICD-10-CM | POA: Diagnosis not present

## 2023-02-02 DIAGNOSIS — E663 Overweight: Secondary | ICD-10-CM

## 2023-02-02 DIAGNOSIS — K860 Alcohol-induced chronic pancreatitis: Secondary | ICD-10-CM | POA: Diagnosis not present

## 2023-02-02 DIAGNOSIS — R7303 Prediabetes: Secondary | ICD-10-CM

## 2023-02-02 DIAGNOSIS — F4310 Post-traumatic stress disorder, unspecified: Secondary | ICD-10-CM

## 2023-02-02 DIAGNOSIS — R0602 Shortness of breath: Secondary | ICD-10-CM | POA: Diagnosis not present

## 2023-02-02 DIAGNOSIS — F419 Anxiety disorder, unspecified: Secondary | ICD-10-CM

## 2023-02-02 DIAGNOSIS — G588 Other specified mononeuropathies: Secondary | ICD-10-CM | POA: Diagnosis not present

## 2023-02-02 DIAGNOSIS — M150 Primary generalized (osteo)arthritis: Secondary | ICD-10-CM

## 2023-02-02 DIAGNOSIS — E7800 Pure hypercholesterolemia, unspecified: Secondary | ICD-10-CM | POA: Diagnosis not present

## 2023-02-02 DIAGNOSIS — J849 Interstitial pulmonary disease, unspecified: Secondary | ICD-10-CM | POA: Diagnosis not present

## 2023-02-02 DIAGNOSIS — F1021 Alcohol dependence, in remission: Secondary | ICD-10-CM

## 2023-02-02 DIAGNOSIS — Z6826 Body mass index (BMI) 26.0-26.9, adult: Secondary | ICD-10-CM

## 2023-02-02 DIAGNOSIS — I700 Atherosclerosis of aorta: Secondary | ICD-10-CM | POA: Diagnosis not present

## 2023-02-02 DIAGNOSIS — F5104 Psychophysiologic insomnia: Secondary | ICD-10-CM

## 2023-02-02 DIAGNOSIS — M159 Polyosteoarthritis, unspecified: Secondary | ICD-10-CM

## 2023-02-02 DIAGNOSIS — F32A Depression, unspecified: Secondary | ICD-10-CM

## 2023-02-02 DIAGNOSIS — F5105 Insomnia due to other mental disorder: Secondary | ICD-10-CM

## 2023-02-02 DIAGNOSIS — N522 Drug-induced erectile dysfunction: Secondary | ICD-10-CM

## 2023-02-02 LAB — CBC
MCV: 92.9 fL (ref 80.0–100.0)
RBC: 4.94 10*6/uL (ref 4.20–5.80)

## 2023-02-02 MED ORDER — NAPROXEN 375 MG PO TABS: 375.0000 mg | ORAL_TABLET | Freq: Two times a day (BID) | ORAL | 0 refills | Status: DC | PRN

## 2023-02-02 MED ORDER — FAMOTIDINE 20 MG PO TABS: 20.0000 mg | ORAL_TABLET | Freq: Every day | ORAL | 1 refills | Status: DC

## 2023-02-02 MED ORDER — SERTRALINE HCL 100 MG PO TABS: 100.0000 mg | ORAL_TABLET | Freq: Every day | ORAL | 1 refills | Status: DC

## 2023-02-02 MED ORDER — AMLODIPINE-OLMESARTAN 10-20 MG PO TABS: 1.0000 | ORAL_TABLET | Freq: Every day | ORAL | 0 refills | Status: DC

## 2023-02-02 MED ORDER — VENTOLIN HFA 108 (90 BASE) MCG/ACT IN AERS
INHALATION_SPRAY | RESPIRATORY_TRACT | 2 refills | Status: DC
Start: 2023-02-02 — End: 2023-04-07

## 2023-02-02 MED ORDER — TRAZODONE HCL 150 MG PO TABS
150.0000 mg | ORAL_TABLET | Freq: Every evening | ORAL | 1 refills | Status: DC | PRN
Start: 2023-02-02 — End: 2024-01-25

## 2023-02-02 MED ORDER — BUPROPION HCL ER (XL) 150 MG PO TB24: 150.0000 mg | ORAL_TABLET | Freq: Every day | ORAL | 1 refills | Status: DC

## 2023-02-02 NOTE — Progress Notes (Signed)
Subjective:    Patient ID: Noah Velasquez, male    DOB: 1966/07/30, 57 y.o.   MRN: [id number]  HPI  Patient presents to clinic today for follow-up of chronic conditions.  HTN: His BP today is 164/96.  He is not taking amlodipine as prescribed.  ECG from 07/2022 reviewed.  HLD with Aortic Atherosclerosis: His last LDL was 72, triglycerides 30, 07/2022.  He is not taking any cholesterol-lowering medication or aspirin.  He does not consume low-fat diet.  OA: Mainly in his hands, knees and feet.  He takes Tylenol or Goody's powders OTC with some relief of symptoms.  He does not follow with orthopedics.  ED: He is not currently taking any medication for this.  He does not follow with urology.  Depression/PTSD: Chronic, not doing well off sertraline and bupropion. He would like to get restarted on this.  He is not currently seeing a therapist.  He denies SI/HI.  GERD: Triggered by greasy and spicy foods.  He is not currently taking omeprazole.  There is no upper GI on file.  Insomnia: He has difficulty staying asleep.  He has run out of trazodone.  There is no sleep study on file.  History of Pancreatitis secondary to Alcohol Abuse: He is not currently taking his pancrelipase.  He follows with GI.  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  There are no PFTs on file.  He is not following with pulmonology.  Prediabetes: His last A1c was 6.3%, 07/2022.  He is not taking any oral diabetic medication at this time.  He does not check his sugars.  Review of Systems     Past Medical History:  Diagnosis Date   Chronic hip pain    Chronic pancreatitis (HCC)    CKD (chronic kidney disease) stage 3, GFR 30-59 ml/min (HCC)    GERD (gastroesophageal reflux disease)    Hyperlipidemia    Hypertension    Osteoarthritis    Pancreatitis    Pancreatitis    PTSD (post-traumatic stress disorder)    Viral infection of left eye     Current Outpatient Medications  Medication Sig Dispense Refill    amLODipine (NORVASC) 10 MG tablet TAKE 1 TABLET BY MOUTH ONCE DAILY 30 tablet 0   aspirin EC 81 MG tablet Take 1 tablet (81 mg total) by mouth daily. Swallow whole. 30 tablet 12   Blood Pressure KIT Please allow patient to chose from machines that are covered under insurance 1 kit 0   budesonide-formoterol (SYMBICORT) 160-4.5 MCG/ACT inhaler Inhale 2 puffs into the lungs 2 (two) times daily. 1 each 2   buPROPion (WELLBUTRIN XL) 150 MG 24 hr tablet Take 1 tablet (150 mg total) by mouth daily. 30 tablet 2   dextromethorphan-guaiFENesin (MUCINEX DM) 30-600 MG 12hr tablet Take 1 tablet by mouth 2 (two) times daily as needed for cough.     famotidine (PEPCID) 20 MG tablet Take 1 tablet (20 mg total) by mouth daily. 30 tablet 0   ipratropium-albuterol (DUONEB) 0.5-2.5 (3) MG/3ML SOLN Take 3 mLs by nebulization every 2 (two) hours as needed (wheeze, SOB). Can use instead of albuterol inhaler 60 mL 1   naproxen (NAPROSYN) 375 MG tablet Take 1 tablet (375 mg total) by mouth 2 (two) times daily as needed for moderate pain or mild pain.     Nebulizers (COMPRESSOR/NEBULIZER) MISC Nebulizer and tubes/mask to use as directed with DuoNeb as needed 1 each 0   nicotine (NICODERM CQ - DOSED IN MG/24 HOURS) 21 mg/24hr  patch Place 1 patch (21 mg total) onto the skin daily. (Patient not taking: Reported on 07/31/2022) 28 patch 0   omeprazole (PRILOSEC) 20 MG capsule TAKE 1 CAPSULE BY MOUTH ONCE DAILY 30 capsule 0   sertraline (ZOLOFT) 100 MG tablet Take 1 tablet (100 mg total) by mouth daily. 30 tablet 0   traZODone (DESYREL) 150 MG tablet Take 1 tablet (150 mg total) by mouth at bedtime as needed for sleep. 30 tablet 0   VENTOLIN HFA 108 (90 Base) MCG/ACT inhaler INHALE 1-2 PUFFS INTO THE LLUNGS EVERY 6HOURS AS NEEDED FOR WHEEZING OR SHORTNESS OF BREATH 18 g 0   No current facility-administered medications for this visit.    Allergies  Allergen Reactions   Hctz [Hydrochlorothiazide] Swelling    Family History   Problem Relation Age of Onset   Diabetes Mother    Sleep apnea Mother    Emphysema Sister    CAD Neg Hx    Mental illness Neg Hx     Social History   Socioeconomic History   Marital status: Legally Separated    Spouse name: Not on file   Number of children: 2   Years of education: Not on file   Highest education level: High school graduate  Occupational History   Occupation: on disability  Tobacco Use   Smoking status: Every Day    Packs/day: 0.25    Years: 15.00    Additional pack years: 0.00    Total pack years: 3.75    Types: Cigarettes   Smokeless tobacco: Never  Vaping Use   Vaping Use: Never used  Substance and Sexual Activity   Alcohol use: Yes    Alcohol/week: 7.0 standard drinks of alcohol    Types: 7 Cans of beer per week    Comment: weekly   Drug use: Yes    Types: Marijuana   Sexual activity: Yes    Birth control/protection: Condom  Other Topics Concern   Not on file  Social History Narrative   Not on file   Social Determinants of Health   Financial Resource Strain: Not on file  Food Insecurity: No Food Insecurity (07/31/2022)   Hunger Vital Sign    Worried About Running Out of Food in the Last Year: Never true    Ran Out of Food in the Last Year: Never true  Transportation Needs: No Transportation Needs (07/31/2022)   PRAPARE - Administrator, Civil Service (Medical): No    Lack of Transportation (Non-Medical): No  Physical Activity: Not on file  Stress: Not on file  Social Connections: Not on file  Intimate Partner Violence: Not At Risk (07/31/2022)   Humiliation, Afraid, Rape, and Kick questionnaire    Fear of Current or Ex-Partner: No    Emotionally Abused: No    Physically Abused: No    Sexually Abused: No     Constitutional: Denies fever, malaise, fatigue, headache or abrupt weight changes.  HEENT: Pt reports dry eyes, eye pressure. Denies eye pain, eye redness, ear pain, ringing in the ears, wax buildup, runny nose,  nasal congestion, bloody nose, or sore throat. Respiratory: Denies difficulty breathing, shortness of breath, cough or sputum production.   Cardiovascular: Pt reports intermittent swelling in legs. Denies chest pain, chest tightness, palpitations or swelling in the hands.  Gastrointestinal: Pt reports intermittent diarrhea. Denies abdominal pain, bloating, constipation, or blood in the stool.  GU: Patient reports erectile dysfunction.  Denies urgency, frequency, pain with urination, burning sensation, blood in urine,  odor or discharge. Musculoskeletal: Patient reports joint pain.  Denies decrease in range of motion, difficulty with gait, muscle pain or joint swelling.  Skin: Denies redness, rashes, lesions or ulcercations.  Neurological: Patient reports insomnia.  Denies dizziness, difficulty with memory, difficulty with speech or problems with balance and coordination.  Psych: Patient has a history of depression.  Denies anxiety, SI/HI.  No other specific complaints in a complete review of systems (except as listed in HPI above).  Objective:   Physical Exam   BP (!) 152/94 (BP Location: Left Arm, Patient Position: Sitting, Cuff Size: Normal)   Pulse 78   Temp (!) 96.9 F (36.1 C) (Temporal)   Wt 169 lb (76.7 kg)   SpO2 95%   BMI 26.47 kg/m   Wt Readings from Last 3 Encounters:  08/01/22 178 lb 12.7 oz (81.1 kg)  06/01/22 174 lb (78.9 kg)  12/31/21 170 lb (77.1 kg)    General: Appears his stated age, overweight, chronically ill appearing, in NAD. Skin: Warm, dry and intact.  HEENT: Head: normal shape and size; Eyes: sclera white, no icterus, conjunctiva pink, PERRLA and EOMs intact, exophthalmus noted;  Cardiovascular: Normal rate and rhythm. S1,S2 noted.  No murmur, rubs or gallops noted. No JVD or BLE edema. No carotid bruits noted. Pulmonary/Chest: Normal effort and coarse breath sounds. No respiratory distress. No wheezes, rales or ronchi noted.  Abdomen: Soft and nontender.  Normal bowel sounds.  Musculoskeletal: In wheelchair. Neurological: Alert and oriented. Coordination normal.  Psychiatric: Mood and affect flat Behavior is normal. Judgment and thought content normal.     BMET    Component Value Date/Time   NA 132 (L) 08/01/2022 0342   K 4.2 08/01/2022 0342   CL 105 08/01/2022 0342   CO2 22 08/01/2022 0342   GLUCOSE 210 (H) 08/01/2022 0342   BUN 20 08/01/2022 0342   CREATININE 1.01 08/01/2022 0342   CREATININE 1.20 12/10/2021 0956   CALCIUM 8.8 (L) 08/01/2022 0342   GFRNONAA >60 08/01/2022 0342   GFRNONAA 64 06/06/2020 1141   GFRAA 74 06/06/2020 1141    Lipid Panel     Component Value Date/Time   CHOL 138 08/01/2022 0342   TRIG 30 08/01/2022 0342   HDL 60 08/01/2022 0342   CHOLHDL 2.3 08/01/2022 0342   VLDL 6 08/01/2022 0342   LDLCALC 72 08/01/2022 0342   LDLCALC 88 06/06/2020 1141    CBC    Component Value Date/Time   WBC 8.8 08/01/2022 0342   RBC 4.30 08/01/2022 0342   HGB 13.4 08/01/2022 0342   HCT 40.7 08/01/2022 0342   PLT 355 08/01/2022 0342   MCV 94.7 08/01/2022 0342   MCH 31.2 08/01/2022 0342   MCHC 32.9 08/01/2022 0342   RDW 13.2 08/01/2022 0342   LYMPHSABS 2.8 12/21/2021 2111   MONOABS 0.6 12/21/2021 2111   EOSABS 0.1 12/21/2021 2111   BASOSABS 0.1 12/21/2021 2111    Hgb A1C Lab Results  Component Value Date   HGBA1C 6.3 (H) 06/01/2022           Assessment & Plan:     RTC in 1 month, follow up HTN, 6 months for your annual exam Nicki Reaper, NP

## 2023-02-03 ENCOUNTER — Encounter: Payer: Self-pay | Admitting: Internal Medicine

## 2023-02-03 LAB — COMPLETE METABOLIC PANEL WITH GFR
AG Ratio: 1 (calc) (ref 1.0–2.5)
ALT: 10 U/L (ref 9–46)
AST: 16 U/L (ref 10–35)
Albumin: 3.8 g/dL (ref 3.6–5.1)
Alkaline phosphatase (APISO): 91 U/L (ref 35–144)
BUN: 14 mg/dL (ref 7–25)
CO2: 24 mmol/L (ref 20–32)
Calcium: 9.1 mg/dL (ref 8.6–10.3)
Chloride: 106 mmol/L (ref 98–110)
Creat: 1.12 mg/dL (ref 0.70–1.30)
Globulin: 3.8 g/dL (calc) — ABNORMAL HIGH (ref 1.9–3.7)
Glucose, Bld: 118 mg/dL — ABNORMAL HIGH (ref 65–99)
Potassium: 4.3 mmol/L (ref 3.5–5.3)
Sodium: 137 mmol/L (ref 135–146)
Total Bilirubin: 0.5 mg/dL (ref 0.2–1.2)
Total Protein: 7.6 g/dL (ref 6.1–8.1)
eGFR: 77 mL/min/{1.73_m2} (ref >=60)

## 2023-02-03 LAB — CBC
HCT: 45.9 % (ref 38.5–50.0)
Hemoglobin: 15.8 g/dL (ref 13.2–17.1)
MCH: 32 pg (ref 27.0–33.0)
MCHC: 34.4 g/dL (ref 32.0–36.0)
MPV: 10.1 fL (ref 7.5–12.5)
Platelets: 300 10*3/uL (ref 140–400)
RDW: 12.3 % (ref 11.0–15.0)
WBC: 7.8 10*3/uL (ref 3.8–10.8)

## 2023-02-03 LAB — HEMOGLOBIN A1C
Hgb A1c MFr Bld: 6.3 % of total Hgb — ABNORMAL HIGH (ref <5.7)
Mean Plasma Glucose: 134 mg/dL
eAG (mmol/L): 7.4 mmol/L

## 2023-02-03 LAB — LIPID PANEL
Cholesterol: 126 mg/dL (ref <200)
HDL: 70 mg/dL (ref >=40)
LDL Cholesterol (Calc): 42 mg/dL (calc)
Non-HDL Cholesterol (Calc): 56 mg/dL (calc) (ref <130)
Total CHOL/HDL Ratio: 1.8 (calc) (ref <5.0)
Triglycerides: 48 mg/dL (ref <150)

## 2023-02-03 NOTE — Assessment & Plan Note (Signed)
Will have him restart omeprazole Avoid foods that trigger reflux

## 2023-02-03 NOTE — Assessment & Plan Note (Signed)
C-Met and lipid profile today We will discuss statin therapy if LDL >70

## 2023-02-03 NOTE — Assessment & Plan Note (Signed)
Encouraged abstinence.  

## 2023-02-03 NOTE — Assessment & Plan Note (Signed)
Uncontrolled off meds Rx for amlodipine-olmesartan Reinforced DASH diet and exercise for weight loss C-Met today

## 2023-02-03 NOTE — Assessment & Plan Note (Signed)
Continue Tylenol or Goody's powders OTC

## 2023-02-03 NOTE — Assessment & Plan Note (Signed)
Encouraged alcohol abstinence He is not taking pancrelipase

## 2023-02-03 NOTE — Assessment & Plan Note (Signed)
Will restart sertraline and bupropion Support offered

## 2023-02-03 NOTE — Assessment & Plan Note (Signed)
He is going to consider restarting gabapentin

## 2023-02-03 NOTE — Patient Instructions (Signed)

## 2023-02-03 NOTE — Assessment & Plan Note (Signed)
Encourage diet for weight loss 

## 2023-02-03 NOTE — Assessment & Plan Note (Signed)
A1c today Encourage low-carb diet 

## 2023-02-03 NOTE — Assessment & Plan Note (Signed)
Trazodone refilled today.  

## 2023-02-03 NOTE — Assessment & Plan Note (Signed)
Encourage smoking cessation Continue Symbicort and albuterol 

## 2023-02-03 NOTE — Assessment & Plan Note (Signed)
Will restart sertraline and bupropion Support offered 

## 2023-02-03 NOTE — Assessment & Plan Note (Signed)
C-Met and lipid profile today Encouraged him to consume low-fat diet We will discuss statin therapy if LDL >70 We will have him start baby aspirin

## 2023-02-03 NOTE — Assessment & Plan Note (Signed)
Not medicated 

## 2023-02-04 MED ORDER — LOVASTATIN 10 MG PO TABS: 10.0000 mg | ORAL_TABLET | Freq: Every day | ORAL | 1 refills | Status: DC

## 2023-02-04 NOTE — Addendum Note (Signed)
Addended by: Lorre Munroe on: 02/04/2023 06:35 AM   Modules accepted: Orders

## 2023-02-11 ENCOUNTER — Other Ambulatory Visit: Payer: Self-pay | Admitting: Internal Medicine

## 2023-02-12 NOTE — Telephone Encounter (Signed)
Unable to refill per protocol, Rx request last refilled 02/02/23 for 90 days.  Requested Prescriptions  Pending Prescriptions Disp Refills   naproxen (NAPROSYN) 500 MG tablet [Pharmacy Med Name: NAPROXEN 500 MG TAB] 30 tablet     Sig: TAKE 1 TABLET BY MOUTH TWICE DAILY AS NEEDED FOR MILD OR MODERATE PAIN     Analgesics:  NSAIDS Failed - 02/11/2023 12:27 PM      Failed - Manual Review: Labs are only required if the patient has taken medication for more than 8 weeks.      Passed - Cr in normal range and within 360 days    Creat  Date Value Ref Range Status  02/02/2023 1.12 0.70 - 1.30 mg/dL Final         Passed - HGB in normal range and within 360 days    Hemoglobin  Date Value Ref Range Status  02/02/2023 15.8 13.2 - 17.1 g/dL Final         Passed - PLT in normal range and within 360 days    Platelets  Date Value Ref Range Status  02/02/2023 300 140 - 400 Thousand/uL Final         Passed - HCT in normal range and within 360 days    HCT  Date Value Ref Range Status  02/02/2023 45.9 38.5 - 50.0 % Final         Passed - eGFR is 30 or above and within 360 days    GFR, Est African American  Date Value Ref Range Status  06/06/2020 74 > OR = 60 mL/min/1.55m2 Final   GFR, Est Non African American  Date Value Ref Range Status  06/06/2020 64 > OR = 60 mL/min/1.44m2 Final   GFR, Estimated  Date Value Ref Range Status  08/01/2022 >60 >60 mL/min Final    Comment:    (NOTE) Calculated using the CKD-EPI Creatinine Equation (2021)    eGFR  Date Value Ref Range Status  02/02/2023 77 > OR = 60 mL/min/1.24m2 Final         Passed - Patient is not pregnant      Passed - Valid encounter within last 12 months    Recent Outpatient Visits           1 week ago Prediabetes   [place] [facility] [place], Salvadore Oxford, NP   1 year ago 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[facility])   [place] [facility] [place], Salvadore Oxford, NP   1 year ago LUQ pain   [facility] [place], Salvadore Oxford, NP   1 year ago Shortness of breath   [place] [facility] [place], Salvadore Oxford, NP   1 year ago Exophthalmus   [place] [facility] [place], Salvadore Oxford, NP       Future Appointments             In 3 weeks Sam[place] Si, Salvadore Oxford, NP [place] [facility]

## 2023-03-09 ENCOUNTER — Ambulatory Visit: Payer: Medicaid Other | Admitting: Internal Medicine

## 2023-03-12 ENCOUNTER — Other Ambulatory Visit: Payer: Self-pay | Admitting: Internal Medicine

## 2023-03-12 DIAGNOSIS — H0520 Unspecified exophthalmos: Secondary | ICD-10-CM

## 2023-03-12 NOTE — Telephone Encounter (Signed)
Requested medication (s) are due for refill today: Yes  Requested medication (s) are on the active medication list: Yes  Last refill:    Future visit scheduled: Yes  Notes to clinic:  Manual review.    Requested Prescriptions  Pending Prescriptions Disp Refills   naproxen (NAPROSYN) 500 MG tablet [Pharmacy Med Name: NAPROXEN 500 MG TAB] 30 tablet     Sig: TAKE 1 TABLET BY MOUTH TWICE DAILY AS NEEDED FOR MILD OR MODERATE PAIN     Analgesics:  NSAIDS Failed - 03/12/2023  1:45 PM      Failed - Manual Review: Labs are only required if the patient has taken medication for more than 8 weeks.      Passed - Cr in normal range and within 360 days    Creat  Date Value Ref Range Status  02/02/2023 1.12 0.70 - 1.30 mg/dL Final         Passed - HGB in normal range and within 360 days    Hemoglobin  Date Value Ref Range Status  02/02/2023 15.8 13.2 - 17.1 g/dL Final         Passed - PLT in normal range and within 360 days    Platelets  Date Value Ref Range Status  02/02/2023 300 140 - 400 Thousand/uL Final         Passed - HCT in normal range and within 360 days    HCT  Date Value Ref Range Status  02/02/2023 45.9 38.5 - 50.0 % Final         Passed - eGFR is 30 or above and within 360 days    GFR, Est African American  Date Value Ref Range Status  06/06/2020 74 > OR = 60 mL/min/1.9m2 Final   GFR, Est Non African American  Date Value Ref Range Status  06/06/2020 64 > OR = 60 mL/min/1.70m2 Final   GFR, Estimated  Date Value Ref Range Status  08/01/2022 >60 >60 mL/min Final    Comment:    (NOTE) Calculated using the CKD-EPI Creatinine Equation (2021)    eGFR  Date Value Ref Range Status  02/02/2023 77 > OR = 60 mL/min/1.52m2 Final         Passed - Patient is not pregnant      Passed - Valid encounter within last 12 months    Recent Outpatient Visits           1 month ago Prediabetes   [place] [facility] [place], Salvadore Oxford, NP   1 year ago  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[facility])   [place] [facility] [place], Salvadore Oxford, NP   1 year ago LUQ pain   [place] [facility] [place], Salvadore Oxford, NP   1 year ago Shortness of breath   [place] [facility] [place], Salvadore Oxford, NP   1 year ago Exophthalmus   [place] [facility] [place], Salvadore Oxford, NP       Future Appointments             In 1 week Sampson Si, Salvadore Oxford, NP [place] [facility]            Signed Prescriptions Disp Refills   omeprazole (PRILOSEC) 20 MG capsule 30 capsule 0    Sig: TAKE 1 CAPSULE BY MOUTH ONCE DAILY     Gastroenterology: Proton Pump Inhibitors Passed - 03/12/2023  1:45 PM  Passed - Valid encounter within last 12 months    Recent Outpatient Visits           1 month ago Prediabetes   [place] [facility] [place], Salvadore Oxford, NP   1 year ago 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[facility]   [place] [facility] [place], Salvadore Oxford, NP   1 year ago LUQ pain   [place] [facility] [place], Salvadore Oxford, NP   1 year ago Shortness of breath   [place] [facility] [place], Salvadore Oxford, NP   1 year ago Exophthalmus   [place] [facility] [place], Salvadore Oxford, NP       Future Appointments             In 1 week Sampson Si, Salvadore Oxford, NP [place] [facility], [facility]

## 2023-03-12 NOTE — Telephone Encounter (Signed)
Requested Prescriptions  Pending Prescriptions Disp Refills   omeprazole (PRILOSEC) 20 MG capsule [Pharmacy Med Name: OMEPRAZOLE DR 20 MG CAP] 30 capsule 0    Sig: TAKE 1 CAPSULE BY MOUTH ONCE DAILY     Gastroenterology: Proton Pump Inhibitors Passed - 03/12/2023  1:45 PM      Passed - Valid encounter within last 12 months    Recent Outpatient Visits           1 month ago Prediabetes   [place] [facility] [place], Salvadore Oxford, NP   1 year ago 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[facility])   [place] [facility] [place], Salvadore Oxford, NP   1 year ago LUQ pain   [place] [facility] [place], Salvadore Oxford, NP   1 year ago Shortness of breath   [place] [facility] [place], Salvadore Oxford, NP   1 year ago Exophthalmus   [place] [facility] [place], Salvadore Oxford, NP       Future Appointments             In 1 week [place] Si, Salvadore Oxford, NP [place] [facility]             naproxen (NAPROSYN) 500 MG tablet [Pharmacy Med Name: NAPROXEN 500 MG TAB] 30 tablet     Sig: TAKE 1 TABLET BY MOUTH TWICE DAILY AS NEEDED FOR MILD OR MODERATE PAIN     Analgesics:  NSAIDS Failed - 03/12/2023  1:45 PM      Failed - Manual Review: Labs are only required if the patient has taken medication for more than 8 weeks.      Passed - Cr in normal range and within 360 days    Creat  Date Value Ref Range Status  02/02/2023 1.12 0.70 - 1.30 mg/dL Final         Passed - HGB in normal range and within 360 days    Hemoglobin  Date Value Ref Range Status  02/02/2023 15.8 13.2 - 17.1 g/dL Final         Passed - PLT in normal range and within 360 days    Platelets  Date Value Ref Range Status  02/02/2023 300 140 - 400 Thousand/uL Final         Passed - HCT in normal range and within 360 days    HCT  Date Value Ref Range Status  02/02/2023 45.9 38.5 - 50.0 % Final         Passed - eGFR is 30 or above and within 360  days    GFR, Est African American  Date Value Ref Range Status  06/06/2020 74 > OR = 60 mL/min/1.64m2 Final   GFR, Est Non African American  Date Value Ref Range Status  06/06/2020 64 > OR = 60 mL/min/1.63m2 Final   GFR, Estimated  Date Value Ref Range Status  08/01/2022 >60 >60 mL/min Final    Comment:    (NOTE) Calculated using the CKD-EPI Creatinine Equation (2021)    eGFR  Date Value Ref Range Status  02/02/2023 77 > OR = 60 mL/min/1.66m2 Final         Passed - Patient is not pregnant      Passed - Valid encounter within last 12 months    Recent Outpatient Visits           1 month ago Prediabetes    [facility] [place],  Salvadore Oxford, NP   1 year ago 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[facility])   [place] [facility] [place], Salvadore Oxford, NP   1 year ago LUQ pain   [place] [facility] [place], Salvadore Oxford, NP   1 year ago Shortness of breath   [place] [facility] [place], Salvadore Oxford, NP   1 year ago Exophthalmus   [place] [facility] [place], Salvadore Oxford, NP       Future Appointments             In 1 week Sampson Si, Salvadore Oxford, NP [place] [facility], [facility]

## 2023-03-24 ENCOUNTER — Ambulatory Visit: Payer: Medicaid Other | Admitting: Internal Medicine

## 2023-03-24 NOTE — Progress Notes (Deleted)
Virtual Visit via Video Note  I connected with Noah Velasquez on 03/24/23 at 11:00 AM EDT by a video enabled telemedicine application and verified that I am speaking with the correct person using two identifiers.  Location: Patient: *** Provider: Home  Persons participating in this video call: Nicki Reaper, NP and Noah Velasquez   I discussed the limitations of evaluation and management by telemedicine and the availability of in person appointments. The patient expressed understanding and agreed to proceed.  History of Present Illness:  Patient due for follow-up of HTN.  At his last visit, his BP was elevated off medication.  He was started on amlodipine-olmesartan.  He has been taking the medication as prescribed.  ECG from 07/2022 reviewed.   Past Medical History:  Diagnosis Date   Chronic hip pain    Chronic pancreatitis (HCC)    CKD (chronic kidney disease) stage 3, GFR 30-59 ml/min (HCC)    GERD (gastroesophageal reflux disease)    Hyperlipidemia    Hypertension    Osteoarthritis    Pancreatitis    Pancreatitis    PTSD (post-traumatic stress disorder)    Viral infection of left eye     Current Outpatient Medications  Medication Sig Dispense Refill   amlodipine-olmesartan (AZOR) 10-20 MG tablet Take 1 tablet by mouth daily. 90 tablet 0   aspirin EC 81 MG tablet Take 1 tablet (81 mg total) by mouth daily. Swallow whole. (Patient not taking: Reported on 02/02/2023) 30 tablet 12   Blood Pressure KIT Please allow patient to chose from machines that are covered under insurance 1 kit 0   budesonide-formoterol (SYMBICORT) 160-4.5 MCG/ACT inhaler Inhale 2 puffs into the lungs 2 (two) times daily. 1 each 2   buPROPion (WELLBUTRIN XL) 150 MG 24 hr tablet Take 1 tablet (150 mg total) by mouth daily. 90 tablet 1   dextromethorphan-guaiFENesin (MUCINEX DM) 30-600 MG 12hr tablet Take 1 tablet by mouth 2 (two) times daily as needed for cough.     famotidine (PEPCID) 20 MG tablet Take 1  tablet (20 mg total) by mouth daily. 90 tablet 1   ipratropium-albuterol (DUONEB) 0.5-2.5 (3) MG/3ML SOLN Take 3 mLs by nebulization every 2 (two) hours as needed (wheeze, SOB). Can use instead of albuterol inhaler (Patient not taking: Reported on 02/02/2023) 60 mL 1   lovastatin (MEVACOR) 10 MG tablet Take 1 tablet (10 mg total) by mouth at bedtime. 90 tablet 1   naproxen (NAPROSYN) 500 MG tablet TAKE 1 TABLET BY MOUTH TWICE DAILY AS NEEDED FOR MILD OR MODERATE PAIN 30 tablet 0   Nebulizers (COMPRESSOR/NEBULIZER) MISC Nebulizer and tubes/mask to use as directed with DuoNeb as needed (Patient not taking: Reported on 02/02/2023) 1 each 0   nicotine (NICODERM CQ - DOSED IN MG/24 HOURS) 21 mg/24hr patch Place 1 patch (21 mg total) onto the skin daily. 28 patch 0   omeprazole (PRILOSEC) 20 MG capsule TAKE 1 CAPSULE BY MOUTH ONCE DAILY 30 capsule 0   sertraline (ZOLOFT) 100 MG tablet Take 1 tablet (100 mg total) by mouth daily. 90 tablet 1   traZODone (DESYREL) 150 MG tablet Take 1 tablet (150 mg total) by mouth at bedtime as needed for sleep. 90 tablet 1   VENTOLIN HFA 108 (90 Base) MCG/ACT inhaler INHALE 1-2 PUFFS INTO THE LLUNGS EVERY 6HOURS AS NEEDED FOR WHEEZING OR SHORTNESS OF BREATH 18 g 2   No current facility-administered medications for this visit.    Allergies  Allergen Reactions   Hctz [Hydrochlorothiazide] Swelling  Family History  Problem Relation Age of Onset   Diabetes Mother    Sleep apnea Mother    Emphysema Sister    CAD Neg Hx    Mental illness Neg Hx     Social History   Socioeconomic History   Marital status: Legally Separated    Spouse name: Not on file   Number of children: 2   Years of education: Not on file   Highest education level: High school graduate  Occupational History   Occupation: on disability  Tobacco Use   Smoking status: Every Day    Current packs/day: 0.25    Average packs/day: 0.3 packs/day for 15.0 years (3.8 ttl pk-yrs)    Types:  Cigarettes   Smokeless tobacco: Never  Vaping Use   Vaping status: Never Used  Substance and Sexual Activity   Alcohol use: Yes    Alcohol/week: 7.0 standard drinks of alcohol    Types: 7 Cans of beer per week    Comment: weekly   Drug use: Yes    Types: Marijuana   Sexual activity: Yes    Birth control/protection: Condom  Other Topics Concern   Not on file  Social History Narrative   Not on file   Social Determinants of Health   Financial Resource Strain: Not on file  Food Insecurity: No Food Insecurity (07/31/2022)   Hunger Vital Sign    Worried About Running Out of Food in the Last Year: Never true    Ran Out of Food in the Last Year: Never true  Transportation Needs: No Transportation Needs (07/31/2022)   PRAPARE - Administrator, Civil Service (Medical): No    Lack of Transportation (Non-Medical): No  Physical Activity: Not on file  Stress: Not on file  Social Connections: Not on file  Intimate Partner Violence: Not At Risk (07/31/2022)   Humiliation, Afraid, Rape, and Kick questionnaire    Fear of Current or Ex-Partner: No    Emotionally Abused: No    Physically Abused: No    Sexually Abused: No     Constitutional: Denies fever, malaise, fatigue, headache or abrupt weight changes.  HEENT: Denies eye pain, eye redness, ear pain, ringing in the ears, wax buildup, runny nose, nasal congestion, bloody nose, or sore throat. Respiratory: Patient reports chronic shortness of breath.  Denies difficulty breathing, cough or sputum production.   Cardiovascular: Denies chest pain, chest tightness, palpitations or swelling in the hands or feet.  Gastrointestinal: Patient reports chronic abdominal pain.  Denies bloating, constipation, diarrhea or blood in the stool.  GU: Denies urgency, frequency, pain with urination, burning sensation, blood in urine, odor or discharge. Musculoskeletal: Patient reports chronic joint pain, difficulty with gait.  Denies decrease in  range of motion, muscle pain or joint swelling.  Skin: Denies redness, rashes, lesions or ulcercations.  Neurological: Patient reports neuropathic pain, insomnia.  Denies dizziness, difficulty with memory, difficulty with speech or problems with balance and coordination.  Psych: Patient has a history of anxiety and depression.  Denies SI/HI.  No other specific complaints in a complete review of systems (except as listed in HPI above).    Observations/Objective:   Wt Readings from Last 3 Encounters:  02/02/23 169 lb (76.7 kg)  08/01/22 178 lb 12.7 oz (81.1 kg)  06/01/22 174 lb (78.9 kg)    General: Appears older than his stated age, chronically ill-appearing. Pulmonary/Chest: Normal effort. No respiratory distress.  Musculoskeletal: In wheelchair. Neurological: Alert and oriented. Coordination normal.  Psychiatric: Mood  and affect normal. Behavior is normal. Judgment and thought content normal.     BMET    Component Value Date/Time   NA 137 02/02/2023 1527   K 4.3 02/02/2023 1527   CL 106 02/02/2023 1527   CO2 24 02/02/2023 1527   GLUCOSE 118 (H) 02/02/2023 1527   BUN 14 02/02/2023 1527   CREATININE 1.12 02/02/2023 1527   CALCIUM 9.1 02/02/2023 1527   GFRNONAA >60 08/01/2022 0342   GFRNONAA 64 06/06/2020 1141   GFRAA 74 06/06/2020 1141    Lipid Panel     Component Value Date/Time   CHOL 126 02/02/2023 1527   TRIG 48 02/02/2023 1527   HDL 70 02/02/2023 1527   CHOLHDL 1.8 02/02/2023 1527   VLDL 6 08/01/2022 0342   LDLCALC 42 02/02/2023 1527    CBC    Component Value Date/Time   WBC 7.8 02/02/2023 1527   RBC 4.94 02/02/2023 1527   HGB 15.8 02/02/2023 1527   HCT 45.9 02/02/2023 1527   PLT 300 02/02/2023 1527   MCV 92.9 02/02/2023 1527   MCH 32.0 02/02/2023 1527   MCHC 34.4 02/02/2023 1527   RDW 12.3 02/02/2023 1527   LYMPHSABS 2.8 12/21/2021 2111   MONOABS 0.6 12/21/2021 2111   EOSABS 0.1 12/21/2021 2111   BASOSABS 0.1 12/21/2021 2111    Hgb A1C Lab  Results  Component Value Date   HGBA1C 6.3 (H) 02/02/2023        Assessment and Plan:   RTC in 4 months for annual exam  Follow Up Instructions:    I discussed the assessment and treatment plan with the patient. The patient was provided an opportunity to ask questions and all were answered. The patient agreed with the plan and demonstrated an understanding of the instructions.   The patient was advised to call back or seek an in-person evaluation if the symptoms worsen or if the condition fails to improve as anticipated.   Nicki Reaper, NP;l

## 2023-04-07 ENCOUNTER — Ambulatory Visit (INDEPENDENT_AMBULATORY_CARE_PROVIDER_SITE_OTHER): Payer: Medicaid Other | Admitting: Internal Medicine

## 2023-04-07 ENCOUNTER — Encounter: Payer: Self-pay | Admitting: Internal Medicine

## 2023-04-07 VITALS — BP 148/92 | HR 68 | Temp 96.6°F | Wt 164.0 lb

## 2023-04-07 DIAGNOSIS — G588 Other specified mononeuropathies: Secondary | ICD-10-CM | POA: Diagnosis not present

## 2023-04-07 DIAGNOSIS — R296 Repeated falls: Secondary | ICD-10-CM

## 2023-04-07 DIAGNOSIS — E663 Overweight: Secondary | ICD-10-CM

## 2023-04-07 DIAGNOSIS — Z6825 Body mass index (BMI) 25.0-25.9, adult: Secondary | ICD-10-CM | POA: Diagnosis not present

## 2023-04-07 DIAGNOSIS — M159 Polyosteoarthritis, unspecified: Secondary | ICD-10-CM

## 2023-04-07 DIAGNOSIS — Z6828 Body mass index (BMI) 28.0-28.9, adult: Secondary | ICD-10-CM | POA: Insufficient documentation

## 2023-04-07 DIAGNOSIS — I10 Essential (primary) hypertension: Secondary | ICD-10-CM

## 2023-04-07 MED ORDER — VENTOLIN HFA 108 (90 BASE) MCG/ACT IN AERS: INHALATION_SPRAY | RESPIRATORY_TRACT | 2 refills | Status: DC

## 2023-04-07 MED ORDER — AMLODIPINE-OLMESARTAN 10-40 MG PO TABS: 1.0000 | ORAL_TABLET | Freq: Every day | ORAL | 0 refills | Status: DC

## 2023-04-07 NOTE — Assessment & Plan Note (Signed)
Encourage diet and exercise for weight loss 

## 2023-04-07 NOTE — Progress Notes (Addendum)
Subjective:    Patient ID: Noah Velasquez, male    DOB: 1966-04-30, 57 y.o.   MRN: [id number]  HPI  Patient presents to clinic today for follow-up of HTN.  At his last visit, his BP was elevated off medication.  He was started on amlodipine-olmesartan.  He has been taking the medication as prescribed.  His BP today is 144/92.  ECG from 07/2022 reviewed.  He is requesting referral to home health for a personal care assistant.  He reports he needs assistant with ADLs.  He needs assistance with bathing, meal preparation, ambulation and dressing.  He reports his chronic joint pain and neuropathy is impairing his ability to care for himself.  Review of Systems     Past Medical History:  Diagnosis Date   Chronic hip pain    Chronic pancreatitis (HCC)    CKD (chronic kidney disease) stage 3, GFR 30-59 ml/min (HCC)    GERD (gastroesophageal reflux disease)    Hyperlipidemia    Hypertension    Osteoarthritis    Pancreatitis    Pancreatitis    PTSD (post-traumatic stress disorder)    Viral infection of left eye     Current Outpatient Medications  Medication Sig Dispense Refill   amlodipine-olmesartan (AZOR) 10-20 MG tablet Take 1 tablet by mouth daily. 90 tablet 0   aspirin EC 81 MG tablet Take 1 tablet (81 mg total) by mouth daily. Swallow whole. (Patient not taking: Reported on 02/02/2023) 30 tablet 12   Blood Pressure KIT Please allow patient to chose from machines that are covered under insurance 1 kit 0   budesonide-formoterol (SYMBICORT) 160-4.5 MCG/ACT inhaler Inhale 2 puffs into the lungs 2 (two) times daily. 1 each 2   buPROPion (WELLBUTRIN XL) 150 MG 24 hr tablet Take 1 tablet (150 mg total) by mouth daily. 90 tablet 1   dextromethorphan-guaiFENesin (MUCINEX DM) 30-600 MG 12hr tablet Take 1 tablet by mouth 2 (two) times daily as needed for cough.     famotidine (PEPCID) 20 MG tablet Take 1 tablet (20 mg total) by mouth daily. 90 tablet 1   ipratropium-albuterol (DUONEB) 0.5-2.5  (3) MG/3ML SOLN Take 3 mLs by nebulization every 2 (two) hours as needed (wheeze, SOB). Can use instead of albuterol inhaler (Patient not taking: Reported on 02/02/2023) 60 mL 1   lovastatin (MEVACOR) 10 MG tablet Take 1 tablet (10 mg total) by mouth at bedtime. 90 tablet 1   naproxen (NAPROSYN) 500 MG tablet TAKE 1 TABLET BY MOUTH TWICE DAILY AS NEEDED FOR MILD OR MODERATE PAIN 30 tablet 0   Nebulizers (COMPRESSOR/NEBULIZER) MISC Nebulizer and tubes/mask to use as directed with DuoNeb as needed (Patient not taking: Reported on 02/02/2023) 1 each 0   nicotine (NICODERM CQ - DOSED IN MG/24 HOURS) 21 mg/24hr patch Place 1 patch (21 mg total) onto the skin daily. 28 patch 0   omeprazole (PRILOSEC) 20 MG capsule TAKE 1 CAPSULE BY MOUTH ONCE DAILY 30 capsule 0   sertraline (ZOLOFT) 100 MG tablet Take 1 tablet (100 mg total) by mouth daily. 90 tablet 1   traZODone (DESYREL) 150 MG tablet Take 1 tablet (150 mg total) by mouth at bedtime as needed for sleep. 90 tablet 1   VENTOLIN HFA 108 (90 Base) MCG/ACT inhaler INHALE 1-2 PUFFS INTO THE LLUNGS EVERY 6HOURS AS NEEDED FOR WHEEZING OR SHORTNESS OF BREATH 18 g 2   No current facility-administered medications for this visit.    Allergies  Allergen Reactions   Hctz [Hydrochlorothiazide] Swelling  Family History  Problem Relation Age of Onset   Diabetes Mother    Sleep apnea Mother    Emphysema Sister    CAD Neg Hx    Mental illness Neg Hx     Social History   Socioeconomic History   Marital status: Legally Separated    Spouse name: Not on file   Number of children: 2   Years of education: Not on file   Highest education level: High school graduate  Occupational History   Occupation: on disability  Tobacco Use   Smoking status: Every Day    Current packs/day: 0.25    Average packs/day: 0.3 packs/day for 15.0 years (3.8 ttl pk-yrs)    Types: Cigarettes   Smokeless tobacco: Never  Vaping Use   Vaping status: Never Used  Substance and  Sexual Activity   Alcohol use: Yes    Alcohol/week: 7.0 standard drinks of alcohol    Types: 7 Cans of beer per week    Comment: weekly   Drug use: Yes    Types: Marijuana   Sexual activity: Yes    Birth control/protection: Condom  Other Topics Concern   Not on file  Social History Narrative   Not on file   Social Determinants of Health   Financial Resource Strain: Not on file  Food Insecurity: No Food Insecurity (07/31/2022)   Hunger Vital Sign    Worried About Running Out of Food in the Last Year: Never true    Ran Out of Food in the Last Year: Never true  Transportation Needs: No Transportation Needs (07/31/2022)   PRAPARE - Administrator, Civil Service (Medical): No    Lack of Transportation (Non-Medical): No  Physical Activity: Not on file  Stress: Not on file  Social Connections: Not on file  Intimate Partner Violence: Not At Risk (07/31/2022)   Humiliation, Afraid, Rape, and Kick questionnaire    Fear of Current or Ex-Partner: No    Emotionally Abused: No    Physically Abused: No    Sexually Abused: No     Constitutional: Denies fever, malaise, fatigue, headache or abrupt weight changes.  HEENT: Denies eye pain, eye redness, ear pain, ringing in the ears, wax buildup, runny nose, nasal congestion, bloody nose, or sore throat. Respiratory: Denies difficulty breathing, shortness of breath, cough or sputum production.   Cardiovascular: Denies chest pain, chest tightness, palpitations or swelling in the hands or feet.  Gastrointestinal: Patient reports chronic abdominal pain.  Denies bloating, constipation, diarrhea or blood in the stool.  GU: Patient reports erectile dysfunction.  Denies urgency, frequency, pain with urination, burning sensation, blood in urine, odor or discharge. Musculoskeletal: Patient reports joint pain.  Denies decrease in range of motion, difficulty with gait, muscle pain or joint swelling.  Skin: Denies redness, rashes, lesions or  ulcercations.  Neurological: Patient reports neuropathy, insomnia.  Denies dizziness, difficulty with memory, difficulty with speech or problems with balance and coordination.  Psych: Patient has a history of anxiety and depression.  Denies SI/HI.  No other specific complaints in a complete review of systems (except as listed in HPI above).  Objective:   Physical Exam  BP (!) 148/92 (BP Location: Left Arm, Patient Position: Sitting, Cuff Size: Normal)   Pulse 68   Temp (!) 96.6 F (35.9 C) (Temporal)   Wt 164 lb (74.4 kg)   BMI 25.69 kg/m   Wt Readings from Last 3 Encounters:  02/02/23 169 lb (76.7 kg)  08/01/22 178 lb 12.7  oz (81.1 kg)  06/01/22 174 lb (78.9 kg)    General: Appears his stated age, overweight, in NAD. HEENT: Head: normal shape and size; Eyes: sclera white, no icterus, conjunctiva pink, PERRLA and EOMs intact; Cardiovascular: Normal rate and rhythm. S1,S2 noted.  No murmur, rubs or gallops noted. No JVD or BLE edema.  Pulmonary/Chest: Normal effort and positive vesicular breath sounds. No respiratory distress. No wheezes, rales or ronchi noted.  Musculoskeletal: In wheelchair. Neurological: Alert and oriented. Coordination normal.     BMET    Component Value Date/Time   NA 137 02/02/2023 1527   K 4.3 02/02/2023 1527   CL 106 02/02/2023 1527   CO2 24 02/02/2023 1527   GLUCOSE 118 (H) 02/02/2023 1527   BUN 14 02/02/2023 1527   CREATININE 1.12 02/02/2023 1527   CALCIUM 9.1 02/02/2023 1527   GFRNONAA >60 08/01/2022 0342   GFRNONAA 64 06/06/2020 1141   GFRAA 74 06/06/2020 1141    Lipid Panel     Component Value Date/Time   CHOL 126 02/02/2023 1527   TRIG 48 02/02/2023 1527   HDL 70 02/02/2023 1527   CHOLHDL 1.8 02/02/2023 1527   VLDL 6 08/01/2022 0342   LDLCALC 42 02/02/2023 1527    CBC    Component Value Date/Time   WBC 7.8 02/02/2023 1527   RBC 4.94 02/02/2023 1527   HGB 15.8 02/02/2023 1527   HCT 45.9 02/02/2023 1527   PLT 300 02/02/2023  1527   MCV 92.9 02/02/2023 1527   MCH 32.0 02/02/2023 1527   MCHC 34.4 02/02/2023 1527   RDW 12.3 02/02/2023 1527   LYMPHSABS 2.8 12/21/2021 2111   MONOABS 0.6 12/21/2021 2111   EOSABS 0.1 12/21/2021 2111   BASOSABS 0.1 12/21/2021 2111    Hgb A1C Lab Results  Component Value Date   HGBA1C 6.3 (H) 02/02/2023           Assessment & Plan:  Chronic joint pain, neuropathy:  Referral to home health for assistance with ADLs   RTC in 4 months for your annual exam Nicki Reaper, NP

## 2023-04-07 NOTE — Assessment & Plan Note (Signed)
Improved but not at goal Will reduce amlodipine-olmesartan to 10-40 mg daily Reinforced DASH diet

## 2023-04-07 NOTE — Patient Instructions (Signed)
Hypertension, Adult Hypertension is another name for high blood pressure. High blood pressure forces your heart to work harder to pump blood. This can cause problems over time. There are two numbers in a blood pressure reading. There is a top number (systolic) over a bottom number (diastolic). It is best to have a blood pressure that is below 120/80. What are the causes? The cause of this condition is not known. Some other conditions can lead to high blood pressure. What increases the risk? Some lifestyle factors can make you more likely to develop high blood pressure: Smoking. Not getting enough exercise or physical activity. Being overweight. Having too much fat, sugar, calories, or salt (sodium) in your diet. Drinking too much alcohol. Other risk factors include: Having any of these conditions: Heart disease. Diabetes. High cholesterol. Kidney disease. Obstructive sleep apnea. Having a family history of high blood pressure and high cholesterol. Age. The risk increases with age. Stress. What are the signs or symptoms? High blood pressure may not cause symptoms. Very high blood pressure (hypertensive crisis) may cause: Headache. Fast or uneven heartbeats (palpitations). Shortness of breath. Nosebleed. Vomiting or feeling like you may vomit (nauseous). Changes in how you see. Very bad chest pain. Feeling dizzy. Seizures. How is this treated? This condition is treated by making healthy lifestyle changes, such as: Eating healthy foods. Exercising more. Drinking less alcohol. Your doctor may prescribe medicine if lifestyle changes do not help enough and if: Your top number is above 130. Your bottom number is above 80. Your personal target blood pressure may vary. Follow these instructions at home: Eating and drinking  If told, follow the DASH eating plan. To follow this plan: Fill one half of your plate at each meal with fruits and vegetables. Fill one fourth of your plate  at each meal with whole grains. Whole grains include whole-wheat pasta, brown rice, and whole-grain bread. Eat or drink low-fat dairy products, such as skim milk or low-fat yogurt. Fill one fourth of your plate at each meal with low-fat (lean) proteins. Low-fat proteins include fish, chicken without skin, eggs, beans, and tofu. Avoid fatty meat, cured and processed meat, or chicken with skin. Avoid pre-made or processed food. Limit the amount of salt in your diet to less than 1,500 mg each day. Do not drink alcohol if: Your doctor tells you not to drink. You are pregnant, may be pregnant, or are planning to become pregnant. If you drink alcohol: Limit how much you have to: 0-1 drink a day for women. 0-2 drinks a day for men. Know how much alcohol is in your drink. In the [place] one drink equals one 12 oz bottle of beer (355 mL), one 5 oz glass of wine (148 mL), or one 1 oz glass of hard liquor (44 mL). Lifestyle  Work with your doctor to stay at a healthy weight or to lose weight. Ask your doctor what the best weight is for you. Get at least 30 minutes of exercise that causes your heart to beat faster (aerobic exercise) most days of the week. This may include walking, swimming, or biking. Get at least 30 minutes of exercise that strengthens your muscles (resistance exercise) at least 3 days a week. This may include lifting weights or doing Pilates. Do not smoke or use any products that contain nicotine or tobacco. If you need help quitting, ask your doctor. Check your blood pressure at home as told by your doctor. Keep all follow-up visits. Medicines Take over-the-counter and prescription medicines   only as told by your doctor. Follow directions carefully. Do not skip doses of blood pressure medicine. The medicine does not work as well if you skip doses. Skipping doses also puts you at risk for problems. Ask your doctor about side effects or reactions to medicines that you should watch  for. Contact a doctor if: You think you are having a reaction to the medicine you are taking. You have headaches that keep coming back. You feel dizzy. You have swelling in your ankles. You have trouble with your vision. Get help right away if: You get a very bad headache. You start to feel mixed up (confused). You feel weak or numb. You feel faint. You have very bad pain in your: Chest. Belly (abdomen). You vomit more than once. You have trouble breathing. These symptoms may be an emergency. Get help right away. Call 911. Do not wait to see if the symptoms will go away. Do not drive yourself to the hospital. Summary Hypertension is another name for high blood pressure. High blood pressure forces your heart to work harder to pump blood. For most people, a normal blood pressure is less than 120/80. Making healthy choices can help lower blood pressure. If your blood pressure does not get lower with healthy choices, you may need to take medicine. This information is not intended to replace advice given to you by your health care provider. Make sure you discuss any questions you have with your health care provider. Document Revised: 05/22/2021 Document Reviewed: 05/22/2021 Elsevier Patient Education  2024 Elsevier Inc.  

## 2023-05-04 ENCOUNTER — Other Ambulatory Visit: Payer: Self-pay | Admitting: Internal Medicine

## 2023-05-04 DIAGNOSIS — H0520 Unspecified exophthalmos: Secondary | ICD-10-CM

## 2023-05-05 NOTE — Telephone Encounter (Signed)
Requested Prescriptions  Pending Prescriptions Disp Refills   omeprazole (PRILOSEC) 20 MG capsule [Pharmacy Med Name: OMEPRAZOLE DR 20 MG CAP] 90 capsule 0    Sig: TAKE 1 CAPSULE BY MOUTH ONCE DAILY     Gastroenterology: Proton Pump Inhibitors Passed - 05/04/2023  9:58 AM      Passed - Valid encounter within last 12 months    Recent Outpatient Visits           4 weeks ago Essential hypertension   [place] [facility] [place], Salvadore Oxford, NP   3 months ago Prediabetes   [place] [facility] [place], Salvadore Oxford, NP   1 year ago 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[facility])   [place] [facility] [place], Salvadore Oxford, NP   1 year ago LUQ pain   [place] [facility] [place], Salvadore Oxford, NP   1 year ago Shortness of breath   [place] [facility] [place], Salvadore Oxford, NP       Future Appointments             In 3 months Baity, Salvadore Oxford, NP [facility]             naproxen (NAPROSYN) 500 MG tablet [Pharmacy Med Name: NAPROXEN 500 MG TAB] 30 tablet 0    Sig: TAKE 1 TABLET BY MOUTH TWICE DAILY AS NEEDED FOR MILD OR MODERATE PAIN     Analgesics:  NSAIDS Failed - 05/04/2023  9:58 AM      Failed - Manual Review: Labs are only required if the patient has taken medication for more than 8 weeks.      Passed - Cr in normal range and within 360 days    Creat  Date Value Ref Range Status  02/02/2023 1.12 0.70 - 1.30 mg/dL Final         Passed - HGB in normal range and within 360 days    Hemoglobin  Date Value Ref Range Status  02/02/2023 15.8 13.2 - 17.1 g/dL Final         Passed - PLT in normal range and within 360 days    Platelets  Date Value Ref Range Status  02/02/2023 300 140 - 400 Thousand/uL Final         Passed - HCT in normal range and within 360 days    HCT  Date Value Ref Range Status  02/02/2023 45.9 38.5 - 50.0 % Final         Passed - eGFR is 30 or above  and within 360 days    GFR, Est African American  Date Value Ref Range Status  06/06/2020 74 > OR = 60 mL/min/1.27m2 Final   GFR, Est Non African American  Date Value Ref Range Status  06/06/2020 64 > OR = 60 mL/min/1.64m2 Final   GFR, Estimated  Date Value Ref Range Status  08/01/2022 >60 >60 mL/min Final    Comment:    (NOTE) Calculated using the CKD-EPI Creatinine Equation (2021)    eGFR  Date Value Ref Range Status  02/02/2023 77 > OR = 60 mL/min/1.77m2 Final         Passed - Patient is not pregnant      Passed - Valid encounter within last 12 months    Recent Outpatient Visits           4 weeks ago Essential hypertension   [place] [facility] [place], Salvadore Oxford, NP   3 months ago Prediabetes   [place] [facility] [place], Salvadore Oxford, NP   1 year ago 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[facility])   [place] [facility] [place], Salvadore Oxford, NP   1 year ago LUQ pain   [place] [facility] [place], Salvadore Oxford, NP   1 year ago Shortness of breath   [place] [facility] [place], Salvadore Oxford, NP       Future Appointments             In 3 months [place], Salvadore Oxford, NP [place]             amLODipine-olmesartan (AZOR) 10-40 MG tablet [Pharmacy Med Name: AMLODIPINE-OLMESARTAN 10-40 MG TAB] 90 tablet 0    Sig: TAKE 1 TABLET BY MOUTH ONCE DAILY     Cardiovascular: CCB + ARB Combos Failed - 05/04/2023  9:58 AM      Failed - Last BP in normal range    BP Readings from Last 1 Encounters:  04/07/23 (!) 148/92         Passed - K in normal range and within 180 days    Potassium  Date Value Ref Range Status  02/02/2023 4.3 3.5 - 5.3 mmol/L Final         Passed - Cr in normal range and within 180 days    Creat  Date Value Ref Range Status  02/02/2023 1.12 0.70 - 1.30 mg/dL Final         Passed - Na in normal range and within 180 days    Sodium  Date  Value Ref Range Status  02/02/2023 137 135 - 146 mmol/L Final         Passed - Patient is not pregnant      Passed - Valid encounter within last 6 months    Recent Outpatient Visits           4 weeks ago Essential hypertension   [place] [facility] [place], Salvadore Oxford, NP   3 months ago Prediabetes   [place] [facility] [place], Salvadore Oxford, NP   1 year ago 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[facility])   [place] [facility] [place], Salvadore Oxford, NP   1 year ago LUQ pain   [place] [facility] [place], Salvadore Oxford, NP   1 year ago Shortness of breath   [place] [facility] [place], Salvadore Oxford, NP       Future Appointments             In 3 months Baity, Salvadore Oxford, NP [place] [facility]

## 2023-05-13 ENCOUNTER — Telehealth: Payer: Self-pay | Admitting: Internal Medicine

## 2023-05-13 NOTE — Telephone Encounter (Addendum)
Johnney Ou with [employer] has called on a recorded line with patient in regards to stating that an authorization was submitted for patient for home care hours but that was denied due to lack of clinical notes. [employer] is needing clinical notes sent to FAX # [phone] for patient to be approved for home health hours with [facility] (patients request of company) and also the reason of why patient needs home health hours. Please advise and follow back up with the patient in regards to this   AUTHORIZATION #: [id number]   Patient callback # [phone]   [facility] Callback # [phone]

## 2023-05-14 NOTE — Telephone Encounter (Signed)
Okay to print off copy of note from 04/07/2023 and fax to home health agency

## 2023-05-19 NOTE — Telephone Encounter (Signed)
This is done. ° °Thanks,  ° °-Kavonte Bearse  °

## 2023-05-20 ENCOUNTER — Telehealth: Payer: Self-pay

## 2023-05-20 NOTE — Telephone Encounter (Signed)
Copied from CRM [id number]. Topic: General - Inquiry >> May 20, 2023  2:43 PM Marlow Baars wrote: Reason for CRM: The patient called in stating [employer] has not approved his home health because they were supposed to receive an order to receive home health care service and to know exactly what is wrong with him and not just the last office visit notes. He states he really needs home health care. Please assist patient further

## 2023-05-21 NOTE — Telephone Encounter (Signed)
Most insurances do not cover home health aides alone.  He has to also have something such as nursing support for medication management or physical therapy.  If he wants a home health aide, he is going to have to pay out-of-pocket.

## 2023-07-29 ENCOUNTER — Other Ambulatory Visit: Payer: Self-pay | Admitting: Internal Medicine

## 2023-07-29 DIAGNOSIS — H0520 Unspecified exophthalmos: Secondary | ICD-10-CM

## 2023-07-29 NOTE — Telephone Encounter (Signed)
Requested Prescriptions  Pending Prescriptions Disp Refills   omeprazole (PRILOSEC) 20 MG capsule [Pharmacy Med Name: OMEPRAZOLE DR 20 MG CAP] 90 capsule 0    Sig: TAKE 1 CAPSULE BY MOUTH ONCE DAILY     Gastroenterology: Proton Pump Inhibitors Passed - 07/29/2023  1:59 PM      Passed - Valid encounter within last 12 months    Recent Outpatient Visits           3 months ago Essential hypertension   [place] [facility] [place], Salvadore Oxford, NP   5 months ago Prediabetes   [place] [facility] [place], Salvadore Oxford, NP   1 year ago 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[facility]   [place] [facility] [place], Salvadore Oxford, NP   1 year ago LUQ pain   [place] [facility] [place], Salvadore Oxford, NP   1 year ago Shortness of breath   [place] [facility] [place], Salvadore Oxford, NP       Future Appointments             In 6 days [place], Salvadore Oxford, NP  [facility]

## 2023-08-04 ENCOUNTER — Encounter: Payer: Medicaid Other | Admitting: Internal Medicine

## 2023-08-04 NOTE — Progress Notes (Deleted)
Subjective:    Patient ID: Noah Velasquez, male    DOB: 1965/09/21, 57 y.o.   MRN: [id number]  HPI  Patient presents to clinic today for his annual exam.  Flu: 05/2021 Tetanus: 07/2018 COVID: Never Pneumovax: 03/2018 Shingrix: 02/2019, 12/2021 PSA screening: 05/2020 Colon screening: Vision screening: Dentist:  Diet: Exercise:  Review of Systems     Past Medical History:  Diagnosis Date   Chronic hip pain    Chronic pancreatitis (HCC)    CKD (chronic kidney disease) stage 3, GFR 30-59 ml/min (HCC)    GERD (gastroesophageal reflux disease)    Hyperlipidemia    Hypertension    Osteoarthritis    Pancreatitis    Pancreatitis    PTSD (post-traumatic stress disorder)    Viral infection of left eye     Current Outpatient Medications  Medication Sig Dispense Refill   amLODipine-olmesartan (AZOR) 10-40 MG tablet TAKE 1 TABLET BY MOUTH ONCE DAILY 90 tablet 0   aspirin EC 81 MG tablet Take 1 tablet (81 mg total) by mouth daily. Swallow whole. (Patient not taking: Reported on 02/02/2023) 30 tablet 12   Blood Pressure KIT Please allow patient to chose from machines that are covered under insurance 1 kit 0   budesonide-formoterol (SYMBICORT) 160-4.5 MCG/ACT inhaler Inhale 2 puffs into the lungs 2 (two) times daily. 1 each 2   buPROPion (WELLBUTRIN XL) 150 MG 24 hr tablet Take 1 tablet (150 mg total) by mouth daily. 90 tablet 1   dextromethorphan-guaiFENesin (MUCINEX DM) 30-600 MG 12hr tablet Take 1 tablet by mouth 2 (two) times daily as needed for cough.     famotidine (PEPCID) 20 MG tablet Take 1 tablet (20 mg total) by mouth daily. 90 tablet 1   ipratropium-albuterol (DUONEB) 0.5-2.5 (3) MG/3ML SOLN Take 3 mLs by nebulization every 2 (two) hours as needed (wheeze, SOB). Can use instead of albuterol inhaler (Patient not taking: Reported on 02/02/2023) 60 mL 1   lovastatin (MEVACOR) 10 MG tablet Take 1 tablet (10 mg total) by mouth at bedtime. 90 tablet 1   naproxen (NAPROSYN) 500 MG  tablet TAKE 1 TABLET BY MOUTH TWICE DAILY AS NEEDED FOR MILD OR MODERATE PAIN 30 tablet 0   Nebulizers (COMPRESSOR/NEBULIZER) MISC Nebulizer and tubes/mask to use as directed with DuoNeb as needed (Patient not taking: Reported on 02/02/2023) 1 each 0   nicotine (NICODERM CQ - DOSED IN MG/24 HOURS) 21 mg/24hr patch Place 1 patch (21 mg total) onto the skin daily. 28 patch 0   omeprazole (PRILOSEC) 20 MG capsule TAKE 1 CAPSULE BY MOUTH ONCE DAILY 90 capsule 0   sertraline (ZOLOFT) 100 MG tablet Take 1 tablet (100 mg total) by mouth daily. 90 tablet 1   traZODone (DESYREL) 150 MG tablet Take 1 tablet (150 mg total) by mouth at bedtime as needed for sleep. 90 tablet 1   VENTOLIN HFA 108 (90 Base) MCG/ACT inhaler INHALE 1-2 PUFFS INTO THE LLUNGS EVERY 6HOURS AS NEEDED FOR WHEEZING OR SHORTNESS OF BREATH 18 g 2   No current facility-administered medications for this visit.    Allergies  Allergen Reactions   Hctz [Hydrochlorothiazide] Swelling    Family History  Problem Relation Age of Onset   Diabetes Mother    Sleep apnea Mother    Emphysema Sister    CAD Neg Hx    Mental illness Neg Hx     Social History   Socioeconomic History   Marital status: Legally Separated    Spouse name: Not on  file   Number of children: 2   Years of education: Not on file   Highest education level: High school graduate  Occupational History   Occupation: on disability  Tobacco Use   Smoking status: Every Day    Current packs/day: 0.25    Average packs/day: 0.3 packs/day for 15.0 years (3.8 ttl pk-yrs)    Types: Cigarettes   Smokeless tobacco: Never  Vaping Use   Vaping status: Never Used  Substance and Sexual Activity   Alcohol use: Yes    Alcohol/week: 7.0 standard drinks of alcohol    Types: 7 Cans of beer per week    Comment: weekly   Drug use: Yes    Types: Marijuana   Sexual activity: Yes    Birth control/protection: Condom  Other Topics Concern   Not on file  Social History Narrative    Not on file   Social Drivers of Health   Financial Resource Strain: Not on file  Food Insecurity: No Food Insecurity (07/31/2022)   Hunger Vital Sign    Worried About Running Out of Food in the Last Year: Never true    Ran Out of Food in the Last Year: Never true  Transportation Needs: No Transportation Needs (07/31/2022)   PRAPARE - Administrator, Civil Service (Medical): No    Lack of Transportation (Non-Medical): No  Physical Activity: Not on file  Stress: Not on file  Social Connections: Not on file  Intimate Partner Violence: Not At Risk (07/31/2022)   Humiliation, Afraid, Rape, and Kick questionnaire    Fear of Current or Ex-Partner: No    Emotionally Abused: No    Physically Abused: No    Sexually Abused: No     Constitutional: Denies fever, malaise, fatigue, headache or abrupt weight changes.  HEENT: Pt reports dry eyes, eye pressure. Denies eye pain, eye redness, ear pain, ringing in the ears, wax buildup, runny nose, nasal congestion, bloody nose, or sore throat. Respiratory: Denies difficulty breathing, shortness of breath, cough or sputum production.   Cardiovascular: Pt reports intermittent swelling in legs. Denies chest pain, chest tightness, palpitations or swelling in the hands.  Gastrointestinal: Pt reports intermittent diarrhea. Denies abdominal pain, bloating, constipation, or blood in the stool.  GU: Patient reports erectile dysfunction.  Denies urgency, frequency, pain with urination, burning sensation, blood in urine, odor or discharge. Musculoskeletal: Patient reports joint pain, difficulty with gait.  Denies decrease in range of motion, muscle pain or joint swelling.  Skin: Denies redness, rashes, lesions or ulcercations.  Neurological: Patient reports insomnia.  Denies dizziness, difficulty with memory, difficulty with speech or problems with balance and coordination.  Psych: Patient has a history of depression.  Denies anxiety, SI/HI.  No  other specific complaints in a complete review of systems (except as listed in HPI above).  Objective:   Physical Exam   There were no vitals taken for this visit.  Wt Readings from Last 3 Encounters:  04/07/23 164 lb (74.4 kg)  02/02/23 169 lb (76.7 kg)  08/01/22 178 lb 12.7 oz (81.1 kg)    General: Appears his stated age, overweight, chronically ill appearing, in NAD. Skin: Warm, dry and intact.  HEENT: Head: normal shape and size; Eyes: sclera white, no icterus, conjunctiva pink, PERRLA and EOMs intact, exophthalmus noted;  Cardiovascular: Normal rate and rhythm. S1,S2 noted.  No murmur, rubs or gallops noted. No JVD or BLE edema. No carotid bruits noted. Pulmonary/Chest: Normal effort and coarse breath sounds. No respiratory distress.  No wheezes, rales or ronchi noted.  Abdomen: Soft and nontender. Normal bowel sounds.  Musculoskeletal: In wheelchair. Neurological: Alert and oriented. Coordination normal.  Psychiatric: Mood and affect flat Behavior is normal. Judgment and thought content normal.     BMET    Component Value Date/Time   NA 137 02/02/2023 1527   K 4.3 02/02/2023 1527   CL 106 02/02/2023 1527   CO2 24 02/02/2023 1527   GLUCOSE 118 (H) 02/02/2023 1527   BUN 14 02/02/2023 1527   CREATININE 1.12 02/02/2023 1527   CALCIUM 9.1 02/02/2023 1527   GFRNONAA >60 08/01/2022 0342   GFRNONAA 64 06/06/2020 1141   GFRAA 74 06/06/2020 1141    Lipid Panel     Component Value Date/Time   CHOL 126 02/02/2023 1527   TRIG 48 02/02/2023 1527   HDL 70 02/02/2023 1527   CHOLHDL 1.8 02/02/2023 1527   VLDL 6 08/01/2022 0342   LDLCALC 42 02/02/2023 1527    CBC    Component Value Date/Time   WBC 7.8 02/02/2023 1527   RBC 4.94 02/02/2023 1527   HGB 15.8 02/02/2023 1527   HCT 45.9 02/02/2023 1527   PLT 300 02/02/2023 1527   MCV 92.9 02/02/2023 1527   MCH 32.0 02/02/2023 1527   MCHC 34.4 02/02/2023 1527   RDW 12.3 02/02/2023 1527   LYMPHSABS 2.8 12/21/2021 2111    MONOABS 0.6 12/21/2021 2111   EOSABS 0.1 12/21/2021 2111   BASOSABS 0.1 12/21/2021 2111    Hgb A1C Lab Results  Component Value Date   HGBA1C 6.3 (H) 02/02/2023           Assessment & Plan:   Preventative health maintenance:  Flu shot Tetanus UTD Encouraged him to get his COVID vaccines Pneumovax today Shingrix UTD Referral to GI for screening colonoscopy Encouraged him to consume a balanced diet and exercise regimen Advised him to see an eye doctor and dentist annually Will check CBC, c-Met, lipid, A1c, PSA and hep C today  RTC in 6 months for follow-up of chronic conditions Nicki Reaper, NP

## 2023-08-09 ENCOUNTER — Other Ambulatory Visit: Payer: Self-pay | Admitting: Internal Medicine

## 2023-08-09 DIAGNOSIS — H0520 Unspecified exophthalmos: Secondary | ICD-10-CM

## 2023-08-10 NOTE — Telephone Encounter (Signed)
Requested Prescriptions  Pending Prescriptions Disp Refills   famotidine (PEPCID) 20 MG tablet [Pharmacy Med Name: FAMOTIDINE 20 MG TAB] 90 tablet 1    Sig: TAKE 1 TABLET BY MOUTH ONCE DAILY     Gastroenterology:  H2 Antagonists Passed - 08/10/2023  2:40 PM      Passed - Valid encounter within last 12 months    Recent Outpatient Visits           4 months ago Essential hypertension   [place] [facility] [place], Salvadore Oxford, NP   6 months ago Prediabetes   [place] [facility] [place], Salvadore Oxford, NP   1 year ago 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[facility])   [place] [facility] [place], Salvadore Oxford, NP   1 year ago LUQ pain   [place] [facility] [place], Salvadore Oxford, NP   1 year ago Shortness of breath   [place] [facility] [place], [place] W, NP               naproxen (NAPROSYN) 500 MG tablet [Pharmacy Med Name: NAPROXEN 500 MG TAB] 30 tablet 0    Sig: TAKE 1 TABLET BY MOUTH TWICE DAILY AS NEEDED FOR MILD OR MODERATE PAIN     Analgesics:  NSAIDS Failed - 08/10/2023  2:40 PM      Failed - Manual Review: Labs are only required if the patient has taken medication for more than 8 weeks.      Passed - Cr in normal range and within 360 days    Creat  Date Value Ref Range Status  02/02/2023 1.12 0.70 - 1.30 mg/dL Final         Passed - HGB in normal range and within 360 days    Hemoglobin  Date Value Ref Range Status  02/02/2023 15.8 13.2 - 17.1 g/dL Final         Passed - PLT in normal range and within 360 days    Platelets  Date Value Ref Range Status  02/02/2023 300 140 - 400 Thousand/uL Final         Passed - HCT in normal range and within 360 days    HCT  Date Value Ref Range Status  02/02/2023 45.9 38.5 - 50.0 % Final         Passed - eGFR is 30 or above and within 360 days    GFR, Est African American  Date Value Ref Range Status  06/06/2020 74 > OR = 60 mL/min/1.74m2 Final    GFR, Est Non African American  Date Value Ref Range Status  06/06/2020 64 > OR = 60 mL/min/1.53m2 Final   GFR, Estimated  Date Value Ref Range Status  08/01/2022 >60 >60 mL/min Final    Comment:    (NOTE) Calculated using the CKD-EPI Creatinine Equation (2021)    eGFR  Date Value Ref Range Status  02/02/2023 77 > OR = 60 mL/min/1.6m2 Final         Passed - Patient is not pregnant      Passed - Valid encounter within last 12 months    Recent Outpatient Visits           4 months ago Essential hypertension   [place] [facility] [place], Salvadore Oxford, NP   6 months ago Prediabetes    [facility] [place], [place] W, NP   1 year ago 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(HCC)  [place] [facility] [place], Salvadore Oxford, NP   1 year ago LUQ pain   [place] [facility] [place], Salvadore Oxford, NP   1 year ago Shortness of breath   [place] [facility] [place], Salvadore [place], [place]

## 2023-08-20 ENCOUNTER — Telehealth: Payer: Self-pay | Admitting: Internal Medicine

## 2023-08-20 MED ORDER — ZENPEP 15000-47000 UNITS PO CPEP: 1.0000 | ORAL_CAPSULE | Freq: Three times a day (TID) | ORAL | 0 refills | Status: DC

## 2023-08-20 NOTE — Addendum Note (Signed)
 Addended by: Lorre Munroe on: 08/20/2023 02:36 PM   Modules accepted: Orders

## 2023-08-20 NOTE — Telephone Encounter (Signed)
 Pt. Reports he needs refill on Zen Pep, he is out of medication. Medication is not on current medication list, but is on history and he states he still takes it. Asking for refill, please advise pt.

## 2023-08-20 NOTE — Telephone Encounter (Signed)
 refilled

## 2023-08-23 ENCOUNTER — Other Ambulatory Visit: Payer: Self-pay | Admitting: Internal Medicine

## 2023-08-25 NOTE — Telephone Encounter (Signed)
 Requested Prescriptions  Pending Prescriptions Disp Refills   amLODipine -olmesartan  (AZOR ) 10-40 MG tablet [Pharmacy Med Name: AMLODIPINE -OLMESARTAN  10-40 MG TAB] 90 tablet 0    Sig: TAKE 1 TABLET BY MOUTH ONCE DAILY     Cardiovascular: CCB + ARB Combos Failed - 08/25/2023  3:46 PM      Failed - K in normal range and within 180 days    Potassium  Date Value Ref Range Status  02/02/2023 4.3 3.5 - 5.3 mmol/L Final         Failed - Cr in normal range and within 180 days    Creat  Date Value Ref Range Status  02/02/2023 1.12 0.70 - 1.30 mg/dL Final         Failed - Na in normal range and within 180 days    Sodium  Date Value Ref Range Status  02/02/2023 137 135 - 146 mmol/L Final         Failed - Last BP in normal range    BP Readings from Last 1 Encounters:  04/07/23 (!) 148/92         Passed - Patient is not pregnant      Passed - Valid encounter within last 6 months    Recent Outpatient Visits           4 months ago Essential hypertension   [place] [facility] [place], Angeline ORN, NP   6 months ago Prediabetes   [facility] [place], Angeline ORN, NP   1 year ago Alcohol -induced chronic pancreatitis [facility])   [place] [facility] [place], Angeline ORN, NP   1 year ago LUQ pain   [place] [facility] [place], Angeline ORN, NP   1 year ago Shortness of breath   [place] [facility] [place], Angeline ORN, [place]

## 2023-08-26 ENCOUNTER — Other Ambulatory Visit: Payer: Self-pay | Admitting: Internal Medicine

## 2023-08-26 NOTE — Telephone Encounter (Signed)
 Copied from CRM [id number]. Topic: Appointments - Appointment Info/Confirmation >> Aug 26, 2023  3:34 PM Tobias CROME wrote: Reason for CRM: Pt scheduled physical for next Tuesday 08/31/2023. Pt states [facility] [employer] [facility] [phone]) would like to be present for appointment for patient. Pt is in need of new mobile chair as his old one is in need of a lot of repairs.

## 2023-08-27 ENCOUNTER — Other Ambulatory Visit: Payer: Self-pay | Admitting: Internal Medicine

## 2023-08-27 NOTE — Telephone Encounter (Signed)
 Pt called saying he is completely out of the medication

## 2023-08-30 NOTE — Telephone Encounter (Signed)
 Duplicate request- 90 day supply requested- patient has appointment tomorrow Requested Prescriptions  Pending Prescriptions Disp Refills   sertraline  (ZOLOFT ) 100 MG tablet [Pharmacy Med Name: SERTRALINE  HCL 100 MG TAB] 90 tablet     Sig: TAKE 1 TABLET BY MOUTH ONCE DAILY     Psychiatry:  Antidepressants - SSRI - sertraline  Passed - 08/30/2023  2:14 PM      Passed - AST in normal range and within 360 days    AST  Date Value Ref Range Status  02/02/2023 16 10 - 35 U/L Final         Passed - ALT in normal range and within 360 days    ALT  Date Value Ref Range Status  02/02/2023 10 9 - 46 U/L Final         Passed - Completed PHQ-2 or PHQ-9 in the last 360 days      Passed - Valid encounter within last 6 months    Recent Outpatient Visits           4 months ago Essential hypertension   [place] [facility] [place], Angeline ORN, NP   6 months ago Prediabetes   [place] [facility] [place], Angeline ORN, NP   1 year ago Alcohol -induced chronic pancreatitis [facility])   [place] [facility] [place], Angeline ORN, NP   1 year ago LUQ pain   [place] [facility] [place], Angeline ORN, NP   1 year ago Shortness of breath   [place] [facility] [place], Angeline ORN, NP       Future Appointments             [place], Angeline ORN, NP [place] [facility]

## 2023-08-31 ENCOUNTER — Encounter: Payer: Self-pay | Admitting: Internal Medicine

## 2023-08-31 ENCOUNTER — Ambulatory Visit (INDEPENDENT_AMBULATORY_CARE_PROVIDER_SITE_OTHER): Payer: Medicaid Other | Admitting: Internal Medicine

## 2023-08-31 VITALS — BP 124/82 | Ht 67.0 in | Wt 181.0 lb

## 2023-08-31 DIAGNOSIS — E7800 Pure hypercholesterolemia, unspecified: Secondary | ICD-10-CM

## 2023-08-31 DIAGNOSIS — E663 Overweight: Secondary | ICD-10-CM

## 2023-08-31 DIAGNOSIS — Z0001 Encounter for general adult medical examination with abnormal findings: Secondary | ICD-10-CM | POA: Diagnosis not present

## 2023-08-31 DIAGNOSIS — Z1159 Encounter for screening for other viral diseases: Secondary | ICD-10-CM | POA: Diagnosis not present

## 2023-08-31 DIAGNOSIS — H538 Other visual disturbances: Secondary | ICD-10-CM | POA: Diagnosis not present

## 2023-08-31 DIAGNOSIS — Z23 Encounter for immunization: Secondary | ICD-10-CM

## 2023-08-31 DIAGNOSIS — Z1211 Encounter for screening for malignant neoplasm of colon: Secondary | ICD-10-CM

## 2023-08-31 DIAGNOSIS — Z6828 Body mass index (BMI) 28.0-28.9, adult: Secondary | ICD-10-CM | POA: Diagnosis not present

## 2023-08-31 DIAGNOSIS — Z122 Encounter for screening for malignant neoplasm of respiratory organs: Secondary | ICD-10-CM

## 2023-08-31 DIAGNOSIS — Z125 Encounter for screening for malignant neoplasm of prostate: Secondary | ICD-10-CM

## 2023-08-31 DIAGNOSIS — R7303 Prediabetes: Secondary | ICD-10-CM | POA: Diagnosis not present

## 2023-08-31 DIAGNOSIS — H04123 Dry eye syndrome of bilateral lacrimal glands: Secondary | ICD-10-CM

## 2023-08-31 NOTE — Progress Notes (Signed)
 Subjective:    Patient ID: Noah Velasquez, male    DOB: [birth year], 58 y.o.   MRN: [id number]  HPI  Patient presents to clinic today for his annual exam.  Flu: 05/2021 Tetanus: 07/2018 COVID: Never Pneumovax: 03/2018 Shingrix : 02/2019, 12/2021 PSA screening: 05/2020 Colon screening: Vision screening: as needed Dental: as needed  Diet: He does eat meat. He consumes fruits and veggies. He does eat some fried foods. He drinks mostly water , beer, juice. Exercise: None    Review of Systems     Past Medical History:  Diagnosis Date   Chronic hip pain    Chronic pancreatitis (HCC)    CKD (chronic kidney disease) stage 3, GFR 30-59 ml/min (HCC)    GERD (gastroesophageal reflux disease)    Hyperlipidemia    Hypertension    Osteoarthritis    Pancreatitis    Pancreatitis    PTSD (post-traumatic stress disorder)    Viral infection of left eye     Current Outpatient Medications  Medication Sig Dispense Refill   amLODipine -olmesartan  (AZOR ) 10-40 MG tablet TAKE 1 TABLET BY MOUTH ONCE DAILY 90 tablet 0   aspirin  EC 81 MG tablet Take 1 tablet (81 mg total) by mouth daily. Swallow whole. (Patient not taking: Reported on 02/02/2023) 30 tablet 12   Blood Pressure KIT Please allow patient to chose from machines that are covered under insurance 1 kit 0   budesonide -formoterol  (SYMBICORT ) 160-4.5 MCG/ACT inhaler Inhale 2 puffs into the lungs 2 (two) times daily. 1 each 2   buPROPion  (WELLBUTRIN  XL) 150 MG 24 hr tablet Take 1 tablet (150 mg total) by mouth daily. 90 tablet 1   dextromethorphan -guaiFENesin  (MUCINEX  DM) 30-600 MG 12hr tablet Take 1 tablet by mouth 2 (two) times daily as needed for cough.     famotidine  (PEPCID ) 20 MG tablet TAKE 1 TABLET BY MOUTH ONCE DAILY 90 tablet 1   ipratropium-albuterol  (DUONEB) 0.5-2.5 (3) MG/3ML SOLN Take 3 mLs by nebulization every 2 (two) hours as needed (wheeze, SOB). Can use instead of albuterol  inhaler (Patient not taking: Reported on 02/02/2023)  60 mL 1   lovastatin  (MEVACOR ) 10 MG tablet Take 1 tablet (10 mg total) by mouth at bedtime. 90 tablet 1   naproxen  (NAPROSYN ) 500 MG tablet TAKE 1 TABLET BY MOUTH TWICE DAILY AS NEEDED FOR MILD OR MODERATE PAIN 30 tablet 0   Nebulizers (COMPRESSOR/NEBULIZER) MISC Nebulizer and tubes/mask to use as directed with DuoNeb as needed (Patient not taking: Reported on 02/02/2023) 1 each 0   nicotine  (NICODERM CQ  - DOSED IN MG/24 HOURS) 21 mg/24hr patch Place 1 patch (21 mg total) onto the skin daily. 28 patch 0   omeprazole  (PRILOSEC) 20 MG capsule TAKE 1 CAPSULE BY MOUTH ONCE DAILY 90 capsule 0   Pancrelipase , Lip-Prot-Amyl, (ZENPEP ) 15000-47000 units CPEP Take 1 capsule (15,000 Units total) by mouth 3 (three) times daily. 270 capsule 0   sertraline  (ZOLOFT ) 100 MG tablet TAKE 1 TABLET BY MOUTH ONCE DAILY 30 tablet 0   traZODone  (DESYREL ) 150 MG tablet Take 1 tablet (150 mg total) by mouth at bedtime as needed for sleep. 90 tablet 1   VENTOLIN  HFA 108 (90 Base) MCG/ACT inhaler INHALE 1-2 PUFFS INTO THE LLUNGS EVERY 6HOURS AS NEEDED FOR WHEEZING OR SHORTNESS OF BREATH 18 g 2   No current facility-administered medications for this visit.    Allergies  Allergen Reactions   Hctz [Hydrochlorothiazide] Swelling    Family History  Problem Relation Age of Onset   Diabetes Mother  Sleep apnea Mother    Emphysema Sister    CAD Neg Hx    Mental illness Neg Hx     Social History   Socioeconomic History   Marital status: Legally Separated    Spouse name: Not on file   Number of children: 2   Years of education: Not on file   Highest education level: High school graduate  Occupational History   Occupation: on disability  Tobacco Use   Smoking status: Every Day    Current packs/day: 0.25    Average packs/day: 0.3 packs/day for 15.0 years (3.8 ttl pk-yrs)    Types: Cigarettes   Smokeless tobacco: Never  Vaping Use   Vaping status: Never Used  Substance and Sexual Activity   Alcohol  use: Yes     Alcohol /week: 7.0 standard drinks of alcohol     Types: 7 Cans of beer per week    Comment: weekly   Drug use: Yes    Types: Marijuana   Sexual activity: Yes    Birth control/protection: Condom  Other Topics Concern   Not on file  Social History Narrative   Not on file   Social Drivers of Health   Financial Resource Strain: Not on file  Food Insecurity: No Food Insecurity (07/31/2022)   Hunger Vital Sign    Worried About Running Out of Food in the Last Year: Never true    Ran Out of Food in the Last Year: Never true  Transportation Needs: No Transportation Needs (07/31/2022)   PRAPARE - Administrator, Civil Service (Medical): No    Lack of Transportation (Non-Medical): No  Physical Activity: Not on file  Stress: Not on file  Social Connections: Not on file  Intimate Partner Violence: Not At Risk (07/31/2022)   Humiliation, Afraid, Rape, and Kick questionnaire    Fear of Current or Ex-Partner: No    Emotionally Abused: No    Physically Abused: No    Sexually Abused: No     Constitutional: Denies fever, malaise, fatigue, headache or abrupt weight changes.  HEENT: Pt reports dry eyes, eye pressure. Denies eye pain, eye redness, ear pain, ringing in the ears, wax buildup, runny nose, nasal congestion, bloody nose, or sore throat. Respiratory: Denies difficulty breathing, shortness of breath, cough or sputum production.   Cardiovascular: Pt reports intermittent swelling in legs. Denies chest pain, chest tightness, palpitations or swelling in the hands.  Gastrointestinal: Pt reports intermittent diarrhea. Denies abdominal pain, bloating, constipation, or blood in the stool.  GU: Patient reports erectile dysfunction.  Denies urgency, frequency, pain with urination, burning sensation, blood in urine, odor or discharge. Musculoskeletal: Patient reports joint pain, difficulty with gait.  Denies decrease in range of motion, muscle pain or joint swelling.  Skin: Denies  redness, rashes, lesions or ulcercations.  Neurological: Patient reports insomnia.  Denies dizziness, difficulty with memory, difficulty with speech or problems with balance and coordination.  Psych: Patient has a history of depression.  Denies anxiety, SI/HI.  No other specific complaints in a complete review of systems (except as listed in HPI above).  Objective:   Physical Exam  BP 124/82 (BP Location: Left Arm, Patient Position: Sitting, Cuff Size: Normal)   Ht 5' 7 (1.702 m)   Wt 181 lb (82.1 kg)   BMI 28.35 kg/m    Wt Readings from Last 3 Encounters:  04/07/23 164 lb (74.4 kg)  02/02/23 169 lb (76.7 kg)  08/01/22 178 lb 12.7 oz (81.1 kg)    General: Appears  his stated age, overweight, chronically ill appearing, in NAD. Skin: Warm, dry and intact.  HEENT: Head: normal shape and size; Eyes: sclera white, no icterus, conjunctiva pink, PERRLA and EOMs intact, exophthalmus noted;  Cardiovascular: Normal rate and rhythm. S1,S2 noted.  No murmur, rubs or gallops noted. No JVD or BLE edema. No carotid bruits noted. Pulmonary/Chest: Normal effort and coarse breath sounds. No respiratory distress. No wheezes, rales or ronchi noted.  Abdomen: Soft and nontender. Normal bowel sounds.  Musculoskeletal: In wheelchair. Neurological: Alert and oriented. Coordination normal.  Psychiatric: Mood and affect flat Behavior is normal. Judgment and thought content normal.     BMET    Component Value Date/Time   NA 137 02/02/2023 1527   K 4.3 02/02/2023 1527   CL 106 02/02/2023 1527   CO2 24 02/02/2023 1527   GLUCOSE 118 (H) 02/02/2023 1527   BUN 14 02/02/2023 1527   CREATININE 1.12 02/02/2023 1527   CALCIUM  9.1 02/02/2023 1527   GFRNONAA >60 08/01/2022 0342   GFRNONAA 64 06/06/2020 1141   GFRAA 74 06/06/2020 1141    Lipid Panel     Component Value Date/Time   CHOL 126 02/02/2023 1527   TRIG 48 02/02/2023 1527   HDL 70 02/02/2023 1527   CHOLHDL 1.8 02/02/2023 1527   VLDL 6  08/01/2022 0342   LDLCALC 42 02/02/2023 1527    CBC    Component Value Date/Time   WBC 7.8 02/02/2023 1527   RBC 4.94 02/02/2023 1527   HGB 15.8 02/02/2023 1527   HCT 45.9 02/02/2023 1527   PLT 300 02/02/2023 1527   MCV 92.9 02/02/2023 1527   MCH 32.0 02/02/2023 1527   MCHC 34.4 02/02/2023 1527   RDW 12.3 02/02/2023 1527   LYMPHSABS 2.8 12/21/2021 2111   MONOABS 0.6 12/21/2021 2111   EOSABS 0.1 12/21/2021 2111   BASOSABS 0.1 12/21/2021 2111    Hgb A1C Lab Results  Component Value Date   HGBA1C 6.3 (H) 02/02/2023           Assessment & Plan:   Preventative health maintenance:  Flu shot today Tetanus UTD Encouraged him to get his COVID-vaccine Pneumovax today Shingrix  UTD Referral to GI for screening colonoscopy Encouraged him to consume a balanced diet and exercise regimen Advised him to see an eye doctor and dentist annually We will check CBC, c-Met, lipid, A1c, hep C and PSA today  RTC in 6 months, follow-up chronic conditions Angeline Laura, NP

## 2023-08-31 NOTE — Assessment & Plan Note (Signed)
 Encourage diet and exercise for weight loss

## 2023-08-31 NOTE — Patient Instructions (Signed)
 Health Maintenance, Male  Adopting a healthy lifestyle and getting preventive care are important in promoting health and wellness. Ask your health care provider about:  The right schedule for you to have regular tests and exams.  Things you can do on your own to prevent diseases and keep yourself healthy.  What should I know about diet, weight, and exercise?  Eat a healthy diet    Eat a diet that includes plenty of vegetables, fruits, low-fat dairy products, and lean protein.  Do not eat a lot of foods that are high in solid fats, added sugars, or sodium.  Maintain a healthy weight  Body mass index (BMI) is a measurement that can be used to identify possible weight problems. It estimates body fat based on height and weight. Your health care provider can help determine your BMI and help you achieve or maintain a healthy weight.  Get regular exercise  Get regular exercise. This is one of the most important things you can do for your health. Most adults should:  Exercise for at least 150 minutes each week. The exercise should increase your heart rate and make you sweat (moderate-intensity exercise).  Do strengthening exercises at least twice a week. This is in addition to the moderate-intensity exercise.  Spend less time sitting. Even light physical activity can be beneficial.  Watch cholesterol and blood lipids  Have your blood tested for lipids and cholesterol at 59 years of age, then have this test every 5 years.  You may need to have your cholesterol levels checked more often if:  Your lipid or cholesterol levels are high.  You are older than 59 years of age.  You are at high risk for heart disease.  What should I know about cancer screening?  Many types of cancers can be detected early and may often be prevented. Depending on your health history and family history, you may need to have cancer screening at various ages. This may include screening for:  Colorectal cancer.  Prostate cancer.  Skin cancer.  Lung  cancer.  What should I know about heart disease, diabetes, and high blood pressure?  Blood pressure and heart disease  High blood pressure causes heart disease and increases the risk of stroke. This is more likely to develop in people who have high blood pressure readings or are overweight.  Talk with your health care provider about your target blood pressure readings.  Have your blood pressure checked:  Every 3-5 years if you are 59-52 years of age.  Every year if you are 59 years old or older.  If you are between the ages of 60 and 72 and are a current or former smoker, ask your health care provider if you should have a one-time screening for abdominal aortic aneurysm (AAA).  Diabetes  Have regular diabetes screenings. This checks your fasting blood sugar level. Have the screening done:  Once every three years after age 59 if you are at a normal weight and have a low risk for diabetes.  More often and at a younger age if you are overweight or have a high risk for diabetes.  What should I know about preventing infection?  Hepatitis B  If you have a higher risk for hepatitis B, you should be screened for this virus. Talk with your health care provider to find out if you are at risk for hepatitis B infection.  Hepatitis C  Blood testing is recommended for:  Everyone born from [birth year] through 1966.  Anyone  with known risk factors for hepatitis C.  Sexually transmitted infections (STIs)  You should be screened each year for STIs, including gonorrhea and chlamydia, if:  You are sexually active and are younger than 59 years of age.  You are older than 59 years of age and your health care provider tells you that you are at risk for this type of infection.  Your sexual activity has changed since you were last screened, and you are at increased risk for chlamydia or gonorrhea. Ask your health care provider if you are at risk.  Ask your health care provider about whether you are at high risk for HIV. Your health care provider  may recommend a prescription medicine to help prevent HIV infection. If you choose to take medicine to prevent HIV, you should first get tested for HIV. You should then be tested every 3 months for as long as you are taking the medicine.  Follow these instructions at home:  Alcohol use  Do not drink alcohol if your health care provider tells you not to drink.  If you drink alcohol:  Limit how much you have to 0-2 drinks a day.  Know how much alcohol is in your drink. In the [place] one drink equals one 12 oz bottle of beer (355 mL), one 5 oz glass of wine (148 mL), or one 1 oz glass of hard liquor (44 mL).  Lifestyle  Do not use any products that contain nicotine or tobacco. These products include cigarettes, chewing tobacco, and vaping devices, such as e-cigarettes. If you need help quitting, ask your health care provider.  Do not use street drugs.  Do not share needles.  Ask your health care provider for help if you need support or information about quitting drugs.  General instructions  Schedule regular health, dental, and eye exams.  Stay current with your vaccines.  Tell your health care provider if:  You often feel depressed.  You have ever been abused or do not feel safe at home.  Summary  Adopting a healthy lifestyle and getting preventive care are important in promoting health and wellness.  Follow your health care provider's instructions about healthy diet, exercising, and getting tested or screened for diseases.  Follow your health care provider's instructions on monitoring your cholesterol and blood pressure.  This information is not intended to replace advice given to you by your health care provider. Make sure you discuss any questions you have with your health care provider.  Document Revised: 12/23/2020 Document Reviewed: 12/23/2020  Elsevier Patient Education  2024 [employer].

## 2023-09-01 LAB — CBC
HCT: 43.9 % (ref 38.5–50.0)
Hemoglobin: 15.1 g/dL (ref 13.2–17.1)
MCH: 31.3 pg (ref 27.0–33.0)
MCHC: 34.4 g/dL (ref 32.0–36.0)
MCV: 91.1 fL (ref 80.0–100.0)
MPV: 10.4 fL (ref 7.5–12.5)
Platelets: 294 10*3/uL (ref 140–400)
RBC: 4.82 10*6/uL (ref 4.20–5.80)
RDW: 12.4 % (ref 11.0–15.0)
WBC: 10.5 10*3/uL (ref 3.8–10.8)

## 2023-09-01 LAB — COMPLETE METABOLIC PANEL WITH GFR
AG Ratio: 0.9 (calc) — ABNORMAL LOW (ref 1.0–2.5)
ALT: 10 U/L (ref 9–46)
AST: 18 U/L (ref 10–35)
Albumin: 4.1 g/dL (ref 3.6–5.1)
Alkaline phosphatase (APISO): 109 U/L (ref 35–144)
BUN: 14 mg/dL (ref 7–25)
CO2: 22 mmol/L (ref 20–32)
Calcium: 9.4 mg/dL (ref 8.6–10.3)
Chloride: 97 mmol/L — ABNORMAL LOW (ref 98–110)
Creat: 0.96 mg/dL (ref 0.70–1.30)
Globulin: 4.8 g/dL — ABNORMAL HIGH (ref 1.9–3.7)
Glucose, Bld: 96 mg/dL (ref 65–99)
Potassium: 4 mmol/L (ref 3.5–5.3)
Sodium: 131 mmol/L — ABNORMAL LOW (ref 135–146)
Total Bilirubin: 0.5 mg/dL (ref 0.2–1.2)
Total Protein: 8.9 g/dL — ABNORMAL HIGH (ref 6.1–8.1)
eGFR: 92 mL/min/{1.73_m2} (ref >=60)

## 2023-09-01 LAB — PSA: PSA: 0.64 ng/mL (ref <=4.00)

## 2023-09-01 LAB — LIPID PANEL
Cholesterol: 157 mg/dL (ref <200)
HDL: 67 mg/dL (ref >=40)
LDL Cholesterol (Calc): 76 mg/dL
Non-HDL Cholesterol (Calc): 90 mg/dL (ref <130)
Total CHOL/HDL Ratio: 2.3 (calc) (ref <5.0)
Triglycerides: 61 mg/dL (ref <150)

## 2023-09-01 LAB — HEMOGLOBIN A1C
Hgb A1c MFr Bld: 6.9 %{Hb} — ABNORMAL HIGH (ref <5.7)
Mean Plasma Glucose: 151 mg/dL
eAG (mmol/L): 8.4 mmol/L

## 2023-09-01 LAB — HEPATITIS C ANTIBODY: Hepatitis C Ab: NONREACTIVE

## 2023-09-02 ENCOUNTER — Ambulatory Visit: Payer: Medicaid Other | Admitting: Internal Medicine

## 2023-09-07 ENCOUNTER — Ambulatory Visit (INDEPENDENT_AMBULATORY_CARE_PROVIDER_SITE_OTHER): Payer: Medicaid Other | Admitting: Internal Medicine

## 2023-09-07 ENCOUNTER — Encounter: Payer: Self-pay | Admitting: Internal Medicine

## 2023-09-07 VITALS — BP 138/88 | Ht 67.0 in | Wt 181.0 lb

## 2023-09-07 DIAGNOSIS — G8929 Other chronic pain: Secondary | ICD-10-CM

## 2023-09-07 DIAGNOSIS — R269 Unspecified abnormalities of gait and mobility: Secondary | ICD-10-CM | POA: Diagnosis not present

## 2023-09-07 DIAGNOSIS — G588 Other specified mononeuropathies: Secondary | ICD-10-CM

## 2023-09-07 DIAGNOSIS — M25561 Pain in right knee: Secondary | ICD-10-CM

## 2023-09-07 DIAGNOSIS — E1141 Type 2 diabetes mellitus with diabetic mononeuropathy: Secondary | ICD-10-CM | POA: Diagnosis not present

## 2023-09-07 DIAGNOSIS — M25551 Pain in right hip: Secondary | ICD-10-CM

## 2023-09-07 MED ORDER — METFORMIN HCL 500 MG PO TABS: 500.0000 mg | ORAL_TABLET | Freq: Every day | ORAL | 0 refills | Status: DC

## 2023-09-07 NOTE — Progress Notes (Signed)
Subjective:    Patient ID: Noah Velasquez, male    DOB: [birth year], 58 y.o.   MRN: [id number]  HPI  Discussed the use of AI scribe software for clinical note transcription with the patient, who gave verbal consent to proceed.  The patient, a wheelchair user with joint pain and neuropathy, primarily uses a motorized wheelchair for long distances and outdoor activities, but is able to walk with a cane for short distances and climb stairs at home, albeit with difficulty. He reports needing a hip replacement on the right side, which was initially planned for 2020 but was postponed due to the COVID-19 pandemic. The patient experiences pain in the right hip and knee, but denies any back pain or numbness, tingling, or weakness in the legs. However, he reports persistent numbness in the toes of the left foot due to neuropathy.  The patient has a history of prediabetes, which has recently progressed to diabetes, as indicated by an A1c of 6.9. He acknowledges a family history of diabetes and a diet high in sweets and carbohydrates. He expresses a willingness to make dietary changes but also requests medication to control his blood sugar levels. The patient has also had pancreatitis in the past, which may have contributed to the development of diabetes.  The patient is also in need of a new motorized wheelchair, as his current one requires significant repairs. He has been qualified for a new one and is awaiting the necessary paperwork to be completed and faxed. His disease process impacts his ability for bathing, dressing and toileting.  He is unable to get to areas in his house such as the bathroom or kitchen in a safe and timely manner due to pain and risk of falls.  He has significant arthritis in his right hip and knee measured at a 9 out of a 10 preventing safe and timely use of other assistive device such as a cane, walker, scooter or manual wheelchair.  He is able to safely operate the controls of a  motorized wheelchair.  His current height is 5.7 inches with a weight of 181 pounds.      Review of Systems     Past Medical History:  Diagnosis Date   Chronic hip pain    Chronic pancreatitis (HCC)    CKD (chronic kidney disease) stage 3, GFR 30-59 ml/min (HCC)    GERD (gastroesophageal reflux disease)    Hyperlipidemia    Hypertension    Osteoarthritis    Pancreatitis    Pancreatitis    PTSD (post-traumatic stress disorder)    Viral infection of left eye     Current Outpatient Medications  Medication Sig Dispense Refill   amLODipine-olmesartan (AZOR) 10-40 MG tablet TAKE 1 TABLET BY MOUTH ONCE DAILY 90 tablet 0   aspirin EC 81 MG tablet Take 1 tablet (81 mg total) by mouth daily. Swallow whole. 30 tablet 12   budesonide-formoterol (SYMBICORT) 160-4.5 MCG/ACT inhaler Inhale 2 puffs into the lungs 2 (two) times daily. 1 each 2   famotidine (PEPCID) 20 MG tablet TAKE 1 TABLET BY MOUTH ONCE DAILY 90 tablet 1   lovastatin (MEVACOR) 10 MG tablet Take 1 tablet (10 mg total) by mouth at bedtime. (Patient not taking: Reported on 08/31/2023) 90 tablet 1   naproxen (NAPROSYN) 500 MG tablet TAKE 1 TABLET BY MOUTH TWICE DAILY AS NEEDED FOR MILD OR MODERATE PAIN 30 tablet 0   Nebulizers (COMPRESSOR/NEBULIZER) MISC Nebulizer and tubes/mask to use as directed with DuoNeb as needed 1  each 0   nicotine (NICODERM CQ - DOSED IN MG/24 HOURS) 21 mg/24hr patch Place 1 patch (21 mg total) onto the skin daily. 28 patch 0   omeprazole (PRILOSEC) 20 MG capsule TAKE 1 CAPSULE BY MOUTH ONCE DAILY 90 capsule 0   Pancrelipase, Lip-Prot-Amyl, (ZENPEP) 15000-47000 units CPEP Take 1 capsule (15,000 Units total) by mouth 3 (three) times daily. 270 capsule 0   sertraline (ZOLOFT) 100 MG tablet TAKE 1 TABLET BY MOUTH ONCE DAILY 30 tablet 0   traZODone (DESYREL) 150 MG tablet Take 1 tablet (150 mg total) by mouth at bedtime as needed for sleep. 90 tablet 1   No current facility-administered medications for this  visit.    Allergies  Allergen Reactions   Hctz [Hydrochlorothiazide] Swelling    Family History  Problem Relation Age of Onset   Diabetes Mother    Sleep apnea Mother    Emphysema Sister    CAD Neg Hx    Mental illness Neg Hx     Social History   Socioeconomic History   Marital status: Legally Separated    Spouse name: Not on file   Number of children: 2   Years of education: Not on file   Highest education level: High school graduate  Occupational History   Occupation: on disability  Tobacco Use   Smoking status: Every Day    Current packs/day: 0.25    Average packs/day: 0.3 packs/day for 15.0 years (3.8 ttl pk-yrs)    Types: Cigarettes   Smokeless tobacco: Never  Vaping Use   Vaping status: Never Used  Substance and Sexual Activity   Alcohol use: Yes    Alcohol/week: 7.0 standard drinks of alcohol    Types: 7 Cans of beer per week    Comment: weekly   Drug use: Yes    Types: Marijuana   Sexual activity: Yes    Birth control/protection: Condom  Other Topics Concern   Not on file  Social History Narrative   Not on file   Social Drivers of Health   Financial Resource Strain: Not on file  Food Insecurity: No Food Insecurity (07/31/2022)   Hunger Vital Sign    Worried About Running Out of Food in the Last Year: Never true    Ran Out of Food in the Last Year: Never true  Transportation Needs: No Transportation Needs (07/31/2022)   PRAPARE - Administrator, Civil Service (Medical): No    Lack of Transportation (Non-Medical): No  Physical Activity: Not on file  Stress: Not on file  Social Connections: Not on file  Intimate Partner Violence: Not At Risk (07/31/2022)   Humiliation, Afraid, Rape, and Kick questionnaire    Fear of Current or Ex-Partner: No    Emotionally Abused: No    Physically Abused: No    Sexually Abused: No     Constitutional: Denies fever, malaise, fatigue, headache or abrupt weight changes.  HEENT: Pt reports dry eyes,  eye pressure. Denies eye pain, eye redness, ear pain, ringing in the ears, wax buildup, runny nose, nasal congestion, bloody nose, or sore throat. Respiratory: Denies difficulty breathing, shortness of breath, cough or sputum production.   Cardiovascular: Pt reports intermittent swelling in legs. Denies chest pain, chest tightness, palpitations or swelling in the hands.  Gastrointestinal: Pt reports intermittent diarrhea. Denies abdominal pain, bloating, constipation, or blood in the stool.  GU: Patient reports erectile dysfunction.  Denies urgency, frequency, pain with urination, burning sensation, blood in urine, odor or discharge. Musculoskeletal:  Patient reports joint pain, difficulty with gait.  Denies decrease in range of motion, muscle pain or joint swelling.  Skin: Denies redness, rashes, lesions or ulcercations.  Neurological: Patient reports insomnia, neuropathic pain.  Denies dizziness, difficulty with memory, difficulty with speech or problems with balance and coordination.  Psych: Patient has a history of depression.  Denies anxiety, SI/HI.  No other specific complaints in a complete review of systems (except as listed in HPI above).  Objective:   Physical Exam BP 138/88 (BP Location: Left Arm, Patient Position: Sitting, Cuff Size: Normal)   Ht 5\' 7"  (1.702 m)   Wt 181 lb (82.1 kg)   BMI 28.35 kg/m     Wt Readings from Last 3 Encounters:  08/31/23 181 lb (82.1 kg)  04/07/23 164 lb (74.4 kg)  02/02/23 169 lb (76.7 kg)    General: Appears his stated age, overweight, chronically ill appearing, in NAD. Skin: Warm, dry and intact.  No ulcerations noted. HEENT: Head: normal shape and size; Eyes: sclera white, no icterus, conjunctiva pink, PERRLA and EOMs intact, exophthalmus noted;  Cardiovascular: Normal rate and rhythm. S1,S2 noted.  No murmur, rubs or gallops noted. No JVD or BLE edema. No carotid bruits noted. Pulmonary/Chest: Normal effort and coarse breath sounds. No  respiratory distress. No wheezes, rales or ronchi noted.  Abdomen: Soft and nontender. Normal bowel sounds.  Musculoskeletal: In wheelchair.  He has difficulty getting from a sitting to a standing position.  He has difficulty with flexion and extension of the right hip.  Decreased flexion and extension of the left hip but is better than the right.  Strength 5/5 LLE, 4/5 RLE.  Limping, unsteady gait with use of cane. Neurological: Alert and oriented. Coordination normal.    BMET    Component Value Date/Time   NA 131 (L) 08/31/2023 1318   K 4.0 08/31/2023 1318   CL 97 (L) 08/31/2023 1318   CO2 22 08/31/2023 1318   GLUCOSE 96 08/31/2023 1318   BUN 14 08/31/2023 1318   CREATININE 0.96 08/31/2023 1318   CALCIUM 9.4 08/31/2023 1318   GFRNONAA >60 08/01/2022 0342   GFRNONAA 64 06/06/2020 1141   GFRAA 74 06/06/2020 1141    Lipid Panel     Component Value Date/Time   CHOL 157 08/31/2023 1318   TRIG 61 08/31/2023 1318   HDL 67 08/31/2023 1318   CHOLHDL 2.3 08/31/2023 1318   VLDL 6 08/01/2022 0342   LDLCALC 76 08/31/2023 1318    CBC    Component Value Date/Time   WBC 10.5 08/31/2023 1318   RBC 4.82 08/31/2023 1318   HGB 15.1 08/31/2023 1318   HCT 43.9 08/31/2023 1318   PLT 294 08/31/2023 1318   MCV 91.1 08/31/2023 1318   MCH 31.3 08/31/2023 1318   MCHC 34.4 08/31/2023 1318   RDW 12.4 08/31/2023 1318   LYMPHSABS 2.8 12/21/2021 2111   MONOABS 0.6 12/21/2021 2111   EOSABS 0.1 12/21/2021 2111   BASOSABS 0.1 12/21/2021 2111    Hgb A1C Lab Results  Component Value Date   HGBA1C 6.9 (H) 08/31/2023           Assessment & Plan:   Assessment and Plan    Motorized Wheelchair Patient with joint pain, neuropathy, and right hip pain requiring replacement uses a motorized wheelchair for long distances. The current wheelchair needs significant repairs and patient qualifies for a new one. -Complete and fax necessary paperwork for a new motorized wheelchair.  Right Hip  Pain Patient reports right  hip pain and is in need of a hip replacement. No current orthopedic follow-up. -Encourage patient to pursue orthopedic consultation for hip replacement.  Peripheral Neuropathy Patient reports numbness in the toes of the left foot. -Continue to monitor.  New Onset Diabetes Recent A1C of 6.9, indicating diabetes. Patient has a history of prediabetes and a family history of diabetes. Patient admits to a diet high in carbohydrates and sweets. -Start Metformin 500mg  once daily with food. -Encourage dietary changes, focusing on protein and vegetables. -Schedule follow-up in three months to monitor A1C. -Advise patient to inform eye doctor of diabetes diagnosis and to have annual eye exams.  General Health Maintenance -Continue annual flu shots and pneumonia vaccine as previously administered. -Advise patient to monitor feet for sores due to diabetes.      RTC in 3 months, follow-up chronic conditions Nicki Reaper, NP

## 2023-09-07 NOTE — Patient Instructions (Signed)
Diabetes: Types of Medical Care to Help You Manage Living with and managing diabetes, also known as diabetes mellitus, can be a challenge. Your diabetes care may involve a team of health care providers to help you manage. This team may include: An endocrinologist. This is a physician who specializes in diabetes. Nurses. An expert in healthy eating called a dietitian. A diabetes care and education specialist. An eye doctor. A foot specialist called a podiatrist. How to manage your diabetes Managing your diabetes involves a lot of steps to keep you healthy. Your team will follow guidelines to help you get the best care possible. Here are some general guidelines for managing your diabetes: Physical exams When you're first diagnosed with diabetes, and each year after that, your provider will ask about your medical and family history. You'll also have a physical exam. It may include: Measuring your height, weight, and body mass index (BMI). Checking your blood pressure. Your target blood pressure may vary based on things like your age and health conditions. A thyroid exam. A skin exam. Screening for nerve damage. This may include checking your hands, feet, and arms for: Pain. Tingling or numbness. Weakness. An exam to check your feet. They'll be checked for cuts, bruises, redness, blisters, sores, or other problems. Screening for blood vessel problems. This may include checking the pulse and temperature in your legs and feet. Blood tests Depending on your treatment plan, you may have these tests: Hemoglobin A1C (HbA1C). This test gives information about your blood sugar levels, also called glucose levels, over the past 2-3 months. It's used to adjust your treatment plan, if needed. Lipid testing. This includes total cholesterol, LDL and HDL cholesterol, and triglyceride levels. The goal for LDL is less than 100 mg/dL (5.5 mmol/L). If you're at high risk for problems, the goal is less than 70  mg/dL (3.9 mmol/L). The goal for HDL is 40 mg/dL (2.2 mmol/L) or higher for males, and 50 mg/dL (2.8 mmol/L) or higher for females. The goal for triglycerides is less than 150 mg/dL (8.3 mmol/L). Liver function tests. Kidney function tests. Thyroid function tests.  Dental and eye exams  Visit your dentist two times a year for checkups. Get eye exams as recommended by your provider. This may include: If you have type 1 diabetes, get an eye exam within 5 years after you're diagnosed. Then get exams once a year after your first exam. Children with type 1 diabetes should get an eye exam when they're 68 years old or older and they've had diabetes for 3-5 years. After the first exam, they should get an eye exam every 2 years. If you have type 2 diabetes, get an eye exam as soon as you're diagnosed. Then get one every 1-2 years after your first exam. Shots or vaccines Get shots or vaccines as told. Guidelines include: Everyone aged 26 months and older should get a flu shot every year. People at least 59 years old who have diabetes should get the pneumonia vaccine. All adults who get diagnosed with diabetes should get the hepatitis B vaccine. For all other vaccines, follow the recommendations from the Centers for Disease Control and Prevention (CDC) based on your age. Mental and emotional health Screening for eating disorders, anxiety, and depression is suggested at the time of diagnosis and then as needed. If you show symptoms, you may need more evaluation. You may need to work with a mental health provider. Follow these instructions at home: Treatment plan You'll monitor your blood sugar levels  and may give yourself insulin. Your treatment plan will be reviewed at every medical visit. You and your provider will discuss: How you're taking your medicines, including insulin. Any side effects you have. Your target goals for your blood sugar level. How often you check your blood sugar  level. Lifestyle habits, such as: Your activity level. Any use of tobacco, alcohol, or other substances. Education Your provider will assess how well you manage your blood sugar levels and medicines. You may be referred to: A certified diabetes care and education specialist. This person can help you manage your diabetes throughout your life. A dietitian who can help with your eating plan. An exercise specialist who can discuss your activity level and exercise plan. General instructions Take medicines only as told. Where to find more information American Diabetes Association (ADA): diabetes.org Association of Diabetes Care & Education Specialists (ADCES): adces.org/diabetes-education-dsmes International Diabetes Federation (IDF): http://hill.biz/ This information is not intended to replace advice given to you by your health care provider. Make sure you discuss any questions you have with your health care provider. Document Revised: 03/12/2023 Document Reviewed: 03/12/2023 Elsevier Patient Education  2024 [employer].

## 2023-09-15 ENCOUNTER — Encounter: Payer: Self-pay | Admitting: *Deleted

## 2023-09-16 ENCOUNTER — Other Ambulatory Visit: Payer: Self-pay | Admitting: Internal Medicine

## 2023-09-17 NOTE — Telephone Encounter (Signed)
Requested Prescriptions  Pending Prescriptions Disp Refills   naproxen (NAPROSYN) 500 MG tablet [Pharmacy Med Name: NAPROXEN 500 MG TAB] 30 tablet 0    Sig: TAKE 1 TABLET BY MOUTH TWICE DAILY AS NEEDED FOR MILD OR MODERATE PAIN     Analgesics:  NSAIDS Failed - 09/17/2023 10:25 AM      Failed - Manual Review: Labs are only required if the patient has taken medication for more than 8 weeks.      Passed - Cr in normal range and within 360 days    Creat  Date Value Ref Range Status  08/31/2023 0.96 0.70 - 1.30 mg/dL Final         Passed - HGB in normal range and within 360 days    Hemoglobin  Date Value Ref Range Status  08/31/2023 15.1 13.2 - 17.1 g/dL Final         Passed - PLT in normal range and within 360 days    Platelets  Date Value Ref Range Status  08/31/2023 294 140 - 400 Thousand/uL Final         Passed - HCT in normal range and within 360 days    HCT  Date Value Ref Range Status  08/31/2023 43.9 38.5 - 50.0 % Final         Passed - eGFR is 30 or above and within 360 days    GFR, Est African American  Date Value Ref Range Status  06/06/2020 74 > OR = 60 mL/min/1.50m2 Final   GFR, Est Non African American  Date Value Ref Range Status  06/06/2020 64 > OR = 60 mL/min/1.70m2 Final   GFR, Estimated  Date Value Ref Range Status  08/01/2022 >60 >60 mL/min Final    Comment:    (NOTE) Calculated using the CKD-EPI Creatinine Equation (2021)    eGFR  Date Value Ref Range Status  08/31/2023 92 > OR = 60 mL/min/1.60m2 Final         Passed - Patient is not pregnant      Passed - Valid encounter within last 12 months    Recent Outpatient Visits           1 week ago Type 2 diabetes mellitus with diabetic mononeuropathy, without long-term current use of insulin (HCC)   [place] [facility] [place], Salvadore Oxford, NP   2 weeks ago Encounter for general adult medical examination with abnormal findings   [place] [facility] [place],  Salvadore Oxford, NP   5 months ago Essential hypertension   [place] [facility] [place], Salvadore Oxford, NP   7 months ago Prediabetes   [place] [facility] [place], Salvadore Oxford, NP   1 year ago 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[facility])   [place] [facility] [place], Salvadore Oxford, NP       Future Appointments             In 2 months [place], Salvadore Oxford, NP [place] [facility]   In 5 months [place], Salvadore Oxford, NP  [facility]             lovastatin (MEVACOR) 10 MG tablet [Pharmacy Med Name: LOVASTATIN 10 MG TAB] 90 tablet 1    Sig: TAKE 1 TABLET BY MOUTH AT BEDTIME     Cardiovascular:  Antilipid - Statins 2 Failed - 09/17/2023 10:25 AM      Failed - Lipid  Panel in normal range within the last 12 months    Cholesterol  Date Value Ref Range Status  08/31/2023 157 <200 mg/dL Final   LDL Cholesterol (Calc)  Date Value Ref Range Status  08/31/2023 76 mg/dL (calc) Final    Comment:    Reference range: <100 . Desirable range <100 mg/dL for primary prevention;   <70 mg/dL for patients with CHD or diabetic patients  with > or = 2 CHD risk factors. Marland Kitchen LDL-C is now calculated using the Martin-Hopkins  calculation, which is a validated novel method providing  better accuracy than the Friedewald equation in the  estimation of LDL-C.  Horald Pollen et al. Lenox Ahr. 0981;191(47): 2061-2068  (http://education.[employer].com/faq/FAQ164)    HDL  Date Value Ref Range Status  08/31/2023 67 > OR = 40 mg/dL Final   Triglycerides  Date Value Ref Range Status  08/31/2023 61 <150 mg/dL Final         Passed - Cr in normal range and within 360 days    Creat  Date Value Ref Range Status  08/31/2023 0.96 0.70 - 1.30 mg/dL Final         Passed - Patient is not pregnant      Passed - Valid encounter within last 12 months    Recent Outpatient Visits           1 week ago Type 2 diabetes mellitus with  diabetic mononeuropathy, without long-term current use of insulin (HCC)   [place] [facility] [place], Salvadore Oxford, NP   2 weeks ago Encounter for general adult medical examination with abnormal findings   [place] [facility] [place], Salvadore Oxford, NP   5 months ago Essential hypertension   [place] [facility] [place], Salvadore Oxford, NP   7 months ago Prediabetes   [place] [facility] [place], Salvadore Oxford, NP   1 year ago 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[facility])   [place] [facility] [place], Salvadore Oxford, NP       Future Appointments             In 2 months [place], Salvadore Oxford, NP [place] [facility], [facility]   In 5 months [place], Salvadore Oxford, NP [place] [facility], [facility]            Refused Prescriptions Disp Refills   metFORMIN (GLUCOPHAGE) 500 MG tablet [Pharmacy Med Name: METFORMIN HCL 500 MG TAB] 90 tablet 0    Sig: TAKE 1 TABLET BY MOUTH ONCE DAILY WITH BREAKFAST     Endocrinology:  Diabetes - Biguanides Failed - 09/17/2023 10:25 AM      Failed - B12 Level in normal range and within 720 days    No results found for: "VITAMINB12"       Failed - CBC within normal limits and completed in the last 12 months    WBC  Date Value Ref Range Status  08/31/2023 10.5 3.8 - 10.8 Thousand/uL Final   RBC  Date Value Ref Range Status  08/31/2023 4.82 4.20 - 5.80 Million/uL Final   Hemoglobin  Date Value Ref Range Status  08/31/2023 15.1 13.2 - 17.1 g/dL Final   HCT  Date Value Ref Range Status  08/31/2023 43.9 38.5 - 50.0 % Final   MCHC  Date Value Ref Range Status  08/31/2023 34.4 32.0 - 36.0 g/dL Final    Comment:    For adults, a slight decrease in the  calculated MCHC value (in the range of 30 to 32 g/dL) is most likely not clinically significant; however, it should be interpreted with caution in correlation with other red cell parameters and the patient's  clinical condition.    MCH  Date Value Ref Range Status  08/31/2023 31.3 27.0 - 33.0 pg Final   MCV  Date Value Ref Range Status  08/31/2023 91.1 80.0 - 100.0 fL Final   No results found for: "PLTCOUNTKUC", "LABPLAT", "POCPLA" RDW  Date Value Ref Range Status  08/31/2023 12.4 11.0 - 15.0 % Final         Passed - Cr in normal range and within 360 days    Creat  Date Value Ref Range Status  08/31/2023 0.96 0.70 - 1.30 mg/dL Final         Passed - HBA1C is between 0 and 7.9 and within 180 days    Hgb A1c MFr Bld  Date Value Ref Range Status  08/31/2023 6.9 (H) <5.7 % of total Hgb Final    Comment:    For someone without known diabetes, a hemoglobin A1c value of 6.5% or greater indicates that they may have  diabetes and this should be confirmed with a follow-up  test. . For someone with known diabetes, a value <7% indicates  that their diabetes is well controlled and a value  greater than or equal to 7% indicates suboptimal  control. A1c targets should be individualized based on  duration of diabetes, age, comorbid conditions, and  other considerations. . Currently, no consensus exists regarding use of hemoglobin A1c for diagnosis of diabetes for children. .          Passed - eGFR in normal range and within 360 days    GFR, Est African American  Date Value Ref Range Status  06/06/2020 74 > OR = 60 mL/min/1.76m2 Final   GFR, Est Non African American  Date Value Ref Range Status  06/06/2020 64 > OR = 60 mL/min/1.14m2 Final   GFR, Estimated  Date Value Ref Range Status  08/01/2022 >60 >60 mL/min Final    Comment:    (NOTE) Calculated using the CKD-EPI Creatinine Equation (2021)    eGFR  Date Value Ref Range Status  08/31/2023 92 > OR = 60 mL/min/1.10m2 Final         Passed - Valid encounter within last 6 months    Recent Outpatient Visits           1 week ago Type 2 diabetes mellitus with diabetic mononeuropathy, without long-term current use of  insulin [facility])   [place] [facility] [place], Salvadore Oxford, NP   2 weeks ago Encounter for general adult medical examination with abnormal findings   [place] [facility] [place], Salvadore Oxford, NP   5 months ago Essential hypertension   [place] [facility] [place], Salvadore Oxford, NP   7 months ago Prediabetes   [place] [facility] [place], Salvadore Oxford, NP   1 year ago 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

## 2023-10-11 ENCOUNTER — Other Ambulatory Visit: Payer: Self-pay | Admitting: Internal Medicine

## 2023-10-12 NOTE — Telephone Encounter (Signed)
 Requested Prescriptions  Pending Prescriptions Disp Refills   sertraline (ZOLOFT) 100 MG tablet [Pharmacy Med Name: SERTRALINE HCL 100 MG TAB] 90 tablet 0    Sig: TAKE 1 TABLET BY MOUTH ONCE DAILY     Psychiatry:  Antidepressants - SSRI - sertraline Passed - 10/12/2023  3:38 PM      Passed - AST in normal range and within 360 days    AST  Date Value Ref Range Status  08/31/2023 18 10 - 35 U/L Final         Passed - ALT in normal range and within 360 days    ALT  Date Value Ref Range Status  08/31/2023 10 9 - 46 U/L Final         Passed - Completed PHQ-2 or PHQ-9 in the last 360 days      Passed - Valid encounter within last 6 months    Recent Outpatient Visits           1 month ago Type 2 diabetes mellitus with diabetic mononeuropathy, without long-term current use of insulin (HCC)   [place] [facility] [place], Salvadore Oxford, NP   1 month ago Encounter for general adult medical examination with abnormal findings   [place] [facility] [place], Salvadore Oxford, NP   6 months ago Essential hypertension   [place] [facility] [place], Salvadore Oxford, NP   8 months ago Prediabetes   [place] [facility] [place], Salvadore Oxford, NP   1 year ago 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[facility])   [place] [facility] [place], Salvadore Oxford, NP       Future Appointments             In 1 month [place], Salvadore Oxford, NP [place] [facility], [facility]   In 4 months [place], Salvadore Oxford, NP [facility]

## 2023-10-18 DIAGNOSIS — M25551 Pain in right hip: Secondary | ICD-10-CM | POA: Diagnosis not present

## 2023-10-18 DIAGNOSIS — M25561 Pain in right knee: Secondary | ICD-10-CM | POA: Diagnosis not present

## 2023-10-18 DIAGNOSIS — R269 Unspecified abnormalities of gait and mobility: Secondary | ICD-10-CM | POA: Diagnosis not present

## 2023-10-18 DIAGNOSIS — G588 Other specified mononeuropathies: Secondary | ICD-10-CM | POA: Diagnosis not present

## 2023-10-22 ENCOUNTER — Encounter: Payer: Self-pay | Admitting: Emergency Medicine

## 2023-10-29 ENCOUNTER — Other Ambulatory Visit: Payer: Self-pay | Admitting: Internal Medicine

## 2023-10-29 DIAGNOSIS — H0520 Unspecified exophthalmos: Secondary | ICD-10-CM

## 2023-11-01 NOTE — Telephone Encounter (Signed)
 Requested by interface surescripts. Future visit in 1 month.  Requested Prescriptions  Pending Prescriptions Disp Refills   omeprazole (PRILOSEC) 20 MG capsule [Pharmacy Med Name: OMEPRAZOLE DR 20 MG CAP] 90 capsule 0    Sig: TAKE 1 CAPSULE BY MOUTH ONCE DAILY     Gastroenterology: Proton Pump Inhibitors Passed - 11/01/2023 11:26 AM      Passed - Valid encounter within last 12 months    Recent Outpatient Visits           1 month ago Type 2 diabetes mellitus with diabetic mononeuropathy, without long-term current use of insulin [facility])   [place] [facility] [place], Salvadore Oxford, NP   2 months ago Encounter for general adult medical examination with abnormal findings   [place] [facility] [place], Salvadore Oxford, NP   6 months ago Essential hypertension   [place] [facility] [place], Salvadore Oxford, NP   9 months ago Prediabetes   [place] [facility] [place], Salvadore Oxford, NP   1 year ago 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[facility])   [place] [facility] [place], Salvadore Oxford, NP       Future Appointments             In 1 month [place], Salvadore Oxford, NP [place] [facility], [facility]   In 4 months [place], Salvadore Oxford, NP [facility]

## 2023-12-07 ENCOUNTER — Ambulatory Visit (INDEPENDENT_AMBULATORY_CARE_PROVIDER_SITE_OTHER): Payer: Self-pay | Admitting: Internal Medicine

## 2023-12-07 ENCOUNTER — Encounter: Payer: Self-pay | Admitting: Internal Medicine

## 2023-12-07 VITALS — BP 140/80 | Ht 67.0 in | Wt 181.0 lb

## 2023-12-07 DIAGNOSIS — I700 Atherosclerosis of aorta: Secondary | ICD-10-CM | POA: Diagnosis not present

## 2023-12-07 DIAGNOSIS — E1141 Type 2 diabetes mellitus with diabetic mononeuropathy: Secondary | ICD-10-CM | POA: Diagnosis not present

## 2023-12-07 DIAGNOSIS — K860 Alcohol-induced chronic pancreatitis: Secondary | ICD-10-CM

## 2023-12-07 DIAGNOSIS — H0520 Unspecified exophthalmos: Secondary | ICD-10-CM | POA: Diagnosis not present

## 2023-12-07 DIAGNOSIS — J849 Interstitial pulmonary disease, unspecified: Secondary | ICD-10-CM | POA: Diagnosis not present

## 2023-12-07 DIAGNOSIS — F5105 Insomnia due to other mental disorder: Secondary | ICD-10-CM

## 2023-12-07 DIAGNOSIS — E119 Type 2 diabetes mellitus without complications: Secondary | ICD-10-CM

## 2023-12-07 DIAGNOSIS — K219 Gastro-esophageal reflux disease without esophagitis: Secondary | ICD-10-CM

## 2023-12-07 DIAGNOSIS — M150 Primary generalized (osteo)arthritis: Secondary | ICD-10-CM | POA: Diagnosis not present

## 2023-12-07 DIAGNOSIS — F4310 Post-traumatic stress disorder, unspecified: Secondary | ICD-10-CM

## 2023-12-07 DIAGNOSIS — E663 Overweight: Secondary | ICD-10-CM

## 2023-12-07 DIAGNOSIS — F32A Depression, unspecified: Secondary | ICD-10-CM | POA: Diagnosis not present

## 2023-12-07 DIAGNOSIS — F419 Anxiety disorder, unspecified: Secondary | ICD-10-CM | POA: Diagnosis not present

## 2023-12-07 DIAGNOSIS — I10 Essential (primary) hypertension: Secondary | ICD-10-CM | POA: Diagnosis not present

## 2023-12-07 DIAGNOSIS — E1169 Type 2 diabetes mellitus with other specified complication: Secondary | ICD-10-CM

## 2023-12-07 DIAGNOSIS — Z7984 Long term (current) use of oral hypoglycemic drugs: Secondary | ICD-10-CM

## 2023-12-07 DIAGNOSIS — E785 Hyperlipidemia, unspecified: Secondary | ICD-10-CM | POA: Diagnosis not present

## 2023-12-07 DIAGNOSIS — N522 Drug-induced erectile dysfunction: Secondary | ICD-10-CM

## 2023-12-07 DIAGNOSIS — F1021 Alcohol dependence, in remission: Secondary | ICD-10-CM

## 2023-12-07 MED ORDER — ZENPEP 15000-47000 UNITS PO CPEP: 1.0000 | ORAL_CAPSULE | Freq: Three times a day (TID) | ORAL | 1 refills | Status: AC

## 2023-12-07 MED ORDER — OMEPRAZOLE 20 MG PO CPDR: 20.0000 mg | DELAYED_RELEASE_CAPSULE | Freq: Every day | ORAL | 1 refills | Status: DC

## 2023-12-07 MED ORDER — AMLODIPINE-OLMESARTAN 10-40 MG PO TABS
1.0000 | ORAL_TABLET | Freq: Every day | ORAL | 1 refills | Status: DC
Start: 2023-12-07 — End: 2024-01-25

## 2023-12-07 MED ORDER — LOVASTATIN 10 MG PO TABS: 10.0000 mg | ORAL_TABLET | Freq: Every day | ORAL | 1 refills | Status: DC

## 2023-12-07 MED ORDER — METFORMIN HCL 500 MG PO TABS: 500.0000 mg | ORAL_TABLET | Freq: Every day | ORAL | 1 refills | Status: DC

## 2023-12-07 NOTE — Assessment & Plan Note (Signed)
 Omeprazole  refilled Avoid foods that trigger reflux

## 2023-12-07 NOTE — Assessment & Plan Note (Signed)
Encouraged abstinence.  

## 2023-12-07 NOTE — Assessment & Plan Note (Signed)
Not medicated Support offered 

## 2023-12-07 NOTE — Assessment & Plan Note (Signed)
 Encourage diet and exercise for weight loss

## 2023-12-07 NOTE — Assessment & Plan Note (Signed)
Continue Tylenol or Goody's powders OTC

## 2023-12-07 NOTE — Assessment & Plan Note (Signed)
Trazodone refilled today.  

## 2023-12-07 NOTE — Assessment & Plan Note (Signed)
 C-Met and lipid profile today Encouraged him to consume low-fat diet Will have him restart lovastatin  We will have him start baby aspirin 

## 2023-12-07 NOTE — Assessment & Plan Note (Signed)
 Not medicated

## 2023-12-07 NOTE — Assessment & Plan Note (Signed)
 He is noncompliant with medications, will discontinue Support offered

## 2023-12-07 NOTE — Progress Notes (Signed)
 Subjective:    Patient ID: Noah Velasquez, male    DOB: March 07, 1966, 58 y.o.   MRN: [id number]  HPI  Patient presents to clinic today for follow-up of chronic conditions.  HTN: His BP today is 48/88.  He is taking amlodipine -olmesartan  as prescribed but admits he has been out for a few days.  ECG from 07/2022 reviewed.  HLD with aortic atherosclerosis: His last LDL was 76, triglycerides 61, 08/2023.  He is not taking  lovastatin  as prescribed because he keeps forgetting.  He is taking aspirin  as well.  He does not consume low-fat diet.  OA: Mainly in his hands, knees and feet.  He takes tylenol  or goody's powders OTC with some relief of symptoms.  He does not follow with orthopedics.  ED: He is not currently taking any medication for this.  He does not follow with urology.  Depression/PTSD: Chronic, however he is not taking sertraline  and bupropion  as prescribed.  He is not currently seeing a therapist.  He denies SI/HI.  GERD: Triggered by greasy and spicy foods.  He denies breakthrough on omeprazole .  There is no upper GI on file.  Insomnia: He has difficulty staying asleep.  He is taking trazodone  as needed.  There is no sleep study on file.  History of pancreatitis secondary to alcohol abuse: Managed with pancrelipase  but he reports he ran out of this.  He follows with GI.  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  There are no PFTs on file.  He is not following with pulmonology.  DM2: His last A1c was 6.9%, 08/2023.  He is taking metformin  as prescribed.  He does not check his sugars.  He does not check his feet routinely.  His last eye exam was > 2 years ago.  Flu 08/2023.  Pneumovax 08/2023.  COVID never.  Review of Systems     Past Medical History:  Diagnosis Date   Chronic hip pain    Chronic pancreatitis (HCC)    CKD (chronic kidney disease) stage 3, GFR 30-59 ml/min (HCC)    GERD (gastroesophageal reflux disease)    Hyperlipidemia    Hypertension    Osteoarthritis     Pancreatitis    Pancreatitis    PTSD (post-traumatic stress disorder)    Viral infection of left eye     Current Outpatient Medications  Medication Sig Dispense Refill   amLODipine -olmesartan  (AZOR ) 10-40 MG tablet TAKE 1 TABLET BY MOUTH ONCE DAILY 90 tablet 0   aspirin  EC 81 MG tablet Take 1 tablet (81 mg total) by mouth daily. Swallow whole. 30 tablet 12   budesonide -formoterol  (SYMBICORT ) 160-4.5 MCG/ACT inhaler Inhale 2 puffs into the lungs 2 (two) times daily. 1 each 2   famotidine  (PEPCID ) 20 MG tablet TAKE 1 TABLET BY MOUTH ONCE DAILY 90 tablet 1   lovastatin  (MEVACOR ) 10 MG tablet TAKE 1 TABLET BY MOUTH AT BEDTIME 90 tablet 1   metFORMIN  (GLUCOPHAGE ) 500 MG tablet Take 1 tablet (500 mg total) by mouth daily with breakfast. 90 tablet 0   naproxen  (NAPROSYN ) 500 MG tablet TAKE 1 TABLET BY MOUTH TWICE DAILY AS NEEDED FOR MILD OR MODERATE PAIN 30 tablet 0   Nebulizers (COMPRESSOR/NEBULIZER) MISC Nebulizer and tubes/mask to use as directed with DuoNeb as needed 1 each 0   nicotine  (NICODERM CQ  - DOSED IN MG/24 HOURS) 21 mg/24hr patch Place 1 patch (21 mg total) onto the skin daily. 28 patch 0   omeprazole  (PRILOSEC) 20 MG capsule TAKE 1 CAPSULE BY MOUTH  ONCE DAILY 90 capsule 0   Pancrelipase , Lip-Prot-Amyl, (ZENPEP ) 15000-47000 units CPEP Take 1 capsule (15,000 Units total) by mouth 3 (three) times daily. 270 capsule 0   sertraline  (ZOLOFT ) 100 MG tablet TAKE 1 TABLET BY MOUTH ONCE DAILY 90 tablet 0   traZODone  (DESYREL ) 150 MG tablet Take 1 tablet (150 mg total) by mouth at bedtime as needed for sleep. 90 tablet 1   No current facility-administered medications for this visit.    Allergies  Allergen Reactions   Hctz [Hydrochlorothiazide] Swelling    Family History  Problem Relation Age of Onset   Diabetes Mother    Sleep apnea Mother    Emphysema Sister    CAD Neg Hx    Mental illness Neg Hx     Social History   Socioeconomic History   Marital status: Legally Separated     Spouse name: Not on file   Number of children: 2   Years of education: Not on file   Highest education level: High school graduate  Occupational History   Occupation: on disability  Tobacco Use   Smoking status: Every Day    Current packs/day: 0.25    Average packs/day: 0.3 packs/day for 15.0 years (3.8 ttl pk-yrs)    Types: Cigarettes   Smokeless tobacco: Never  Vaping Use   Vaping status: Never Used  Substance and Sexual Activity   Alcohol use: Yes    Alcohol/week: 7.0 standard drinks of alcohol    Types: 7 Cans of beer per week    Comment: weekly   Drug use: Yes    Types: Marijuana   Sexual activity: Yes    Birth control/protection: Condom  Other Topics Concern   Not on file  Social History Narrative   Not on file   Social Drivers of Health   Financial Resource Strain: Not on file  Food Insecurity: No Food Insecurity (07/31/2022)   Hunger Vital Sign    Worried About Running Out of Food in the Last Year: Never true    Ran Out of Food in the Last Year: Never true  Transportation Needs: No Transportation Needs (07/31/2022)   PRAPARE - Administrator, Civil Service (Medical): No    Lack of Transportation (Non-Medical): No  Physical Activity: Not on file  Stress: Not on file  Social Connections: Not on file  Intimate Partner Violence: Not At Risk (07/31/2022)   Humiliation, Afraid, Rape, and Kick questionnaire    Fear of Current or Ex-Partner: No    Emotionally Abused: No    Physically Abused: No    Sexually Abused: No     Constitutional: Denies fever, malaise, fatigue, headache or abrupt weight changes.  HEENT: Pt reports dry eyes, eye pressure. Denies eye pain, eye redness, ear pain, ringing in the ears, wax buildup, runny nose, nasal congestion, bloody nose, or sore throat. Respiratory: Denies difficulty breathing, shortness of breath, cough or sputum production.   Cardiovascular: Denies chest pain, chest tightness, palpitations or swelling in the  hands or feet.  Gastrointestinal: Pt reports intermittent diarrhea. Denies abdominal pain, bloating, constipation, or blood in the stool.  GU: Patient reports erectile dysfunction.  Denies urgency, frequency, pain with urination, burning sensation, blood in urine, odor or discharge. Musculoskeletal: Patient reports joint pain, difficulty with gait.  Denies decrease in range of motion, muscle pain or joint swelling.  Skin: Denies redness, rashes, lesions or ulcercations.  Neurological: Patient reports insomnia, neuropathy.  Denies dizziness, difficulty with memory, difficulty with speech or problems  with balance and coordination.  Psych: Patient has a history of anxiety and depression.  Denies SI/HI.  No other specific complaints in a complete review of systems (except as listed in HPI above).  Objective:   Physical Exam   BP (!) 140/80   Ht 5\' 7"  (1.702 m)   Wt 181 lb (82.1 kg)   BMI 28.35 kg/m   Wt Readings from Last 3 Encounters:  09/07/23 181 lb (82.1 kg)  08/31/23 181 lb (82.1 kg)  04/07/23 164 lb (74.4 kg)    General: Appears his stated age, overweight, chronically ill appearing, in NAD. Skin: Warm, dry and intact.  No ulcerations noted. HEENT: Head: normal shape and size; Eyes: sclera white, no icterus, conjunctiva pink, PERRLA and EOMs intact, exophthalmus noted;  Cardiovascular: Normal rate and rhythm. S1,S2 noted.  No murmur, rubs or gallops noted. No JVD or BLE edema. No carotid bruits noted. Pulmonary/Chest: Normal effort and coarse breath sounds. No respiratory distress. No wheezes, rales or ronchi noted.  Abdomen: Soft and nontender. Normal bowel sounds.  Musculoskeletal: In wheelchair. Neurological: Alert and oriented. Coordination normal.  Decree sensation of bilateral feet. Psychiatric: Mood and affect flat Behavior is normal. Judgment and thought content normal.     BMET    Component Value Date/Time   NA 131 (L) 08/31/2023 1318   K 4.0 08/31/2023 1318   CL  97 (L) 08/31/2023 1318   CO2 22 08/31/2023 1318   GLUCOSE 96 08/31/2023 1318   BUN 14 08/31/2023 1318   CREATININE 0.96 08/31/2023 1318   CALCIUM  9.4 08/31/2023 1318   GFRNONAA >60 08/01/2022 0342   GFRNONAA 64 06/06/2020 1141   GFRAA 74 06/06/2020 1141    Lipid Panel     Component Value Date/Time   CHOL 157 08/31/2023 1318   TRIG 61 08/31/2023 1318   HDL 67 08/31/2023 1318   CHOLHDL 2.3 08/31/2023 1318   VLDL 6 08/01/2022 0342   LDLCALC 76 08/31/2023 1318    CBC    Component Value Date/Time   WBC 10.5 08/31/2023 1318   RBC 4.82 08/31/2023 1318   HGB 15.1 08/31/2023 1318   HCT 43.9 08/31/2023 1318   PLT 294 08/31/2023 1318   MCV 91.1 08/31/2023 1318   MCH 31.3 08/31/2023 1318   MCHC 34.4 08/31/2023 1318   RDW 12.4 08/31/2023 1318   LYMPHSABS 2.8 12/21/2021 2111   MONOABS 0.6 12/21/2021 2111   EOSABS 0.1 12/21/2021 2111   BASOSABS 0.1 12/21/2021 2111    Hgb A1C Lab Results  Component Value Date   HGBA1C 6.9 (H) 08/31/2023           Assessment & Plan:     RTC in 6 months for follow-up of chronic conditions Helayne Lo, NP

## 2023-12-07 NOTE — Patient Instructions (Signed)

## 2023-12-07 NOTE — Assessment & Plan Note (Signed)
 Encourage smoking cessation Continue symbicort  and albuterol 

## 2023-12-07 NOTE — Assessment & Plan Note (Signed)
 Will continue amlodipine -olmesartan  to 10-40 mg daily Encouraged medication compliance Reinforced DASH diet

## 2023-12-07 NOTE — Assessment & Plan Note (Signed)
 C-Met and lipid profile today Lovastatin  refilled today, encourage medication compliance

## 2023-12-07 NOTE — Assessment & Plan Note (Signed)
 A1c and urine microalbumin today Encourage low-carb diet and exercise for weight loss Continue metformin  Encourage routine eye exam Encouraged routine foot exam Immunizations UTD

## 2023-12-07 NOTE — Assessment & Plan Note (Signed)
 Encouraged alcohol abstinence Pancrelipase  refilled today

## 2023-12-08 LAB — HEMOGLOBIN A1C
Hgb A1c MFr Bld: 6.5 % — ABNORMAL HIGH (ref <5.7)
Mean Plasma Glucose: 140 mg/dL
eAG (mmol/L): 7.7 mmol/L

## 2023-12-08 LAB — CBC
HCT: 47.9 % (ref 38.5–50.0)
Hemoglobin: 16 g/dL (ref 13.2–17.1)
MCH: 31.1 pg (ref 27.0–33.0)
MCHC: 33.4 g/dL (ref 32.0–36.0)
MCV: 93.2 fL (ref 80.0–100.0)
MPV: 10.5 fL (ref 7.5–12.5)
Platelets: 304 10*3/uL (ref 140–400)
RBC: 5.14 10*6/uL (ref 4.20–5.80)
RDW: 13.1 % (ref 11.0–15.0)
WBC: 8 10*3/uL (ref 3.8–10.8)

## 2023-12-08 LAB — COMPREHENSIVE METABOLIC PANEL WITH GFR
AG Ratio: 0.7 (calc) — ABNORMAL LOW (ref 1.0–2.5)
ALT: 13 U/L (ref 9–46)
AST: 24 U/L (ref 10–35)
Albumin: 3.9 g/dL (ref 3.6–5.1)
Alkaline phosphatase (APISO): 95 U/L (ref 35–144)
BUN: 10 mg/dL (ref 7–25)
CO2: 25 mmol/L (ref 20–32)
Calcium: 9.4 mg/dL (ref 8.6–10.3)
Chloride: 101 mmol/L (ref 98–110)
Creat: 1.03 mg/dL (ref 0.70–1.30)
Globulin: 5.3 g/dL — ABNORMAL HIGH (ref 1.9–3.7)
Glucose, Bld: 104 mg/dL (ref 65–139)
Potassium: 4.5 mmol/L (ref 3.5–5.3)
Sodium: 135 mmol/L (ref 135–146)
Total Bilirubin: 0.7 mg/dL (ref 0.2–1.2)
Total Protein: 9.2 g/dL — ABNORMAL HIGH (ref 6.1–8.1)
eGFR: 84 mL/min/{1.73_m2} (ref >=60)

## 2023-12-08 LAB — MICROALBUMIN / CREATININE URINE RATIO
Creatinine, Urine: 203 mg/dL (ref 20–320)
Microalb Creat Ratio: 6 mg/g{creat} (ref <30)
Microalb, Ur: 1.2 mg/dL

## 2023-12-08 LAB — LIPID PANEL
Cholesterol: 151 mg/dL (ref <200)
HDL: 80 mg/dL (ref >=40)
LDL Cholesterol (Calc): 59 mg/dL
Non-HDL Cholesterol (Calc): 71 mg/dL (ref <130)
Total CHOL/HDL Ratio: 1.9 (calc) (ref <5.0)
Triglycerides: 41 mg/dL (ref <150)

## 2024-01-14 ENCOUNTER — Inpatient Hospital Stay
Admission: EM | Admit: 2024-01-14 | Discharge: 2024-01-18 | DRG: 190 | Disposition: A | Discharge status: Home / Self Care | Attending: Student in an Organized Health Care Education/Training Program | Admitting: Student in an Organized Health Care Education/Training Program

## 2024-01-14 DIAGNOSIS — E785 Hyperlipidemia, unspecified: Secondary | ICD-10-CM | POA: Diagnosis present

## 2024-01-14 DIAGNOSIS — Z888 Allergy status to other drugs, medicaments and biological substances status: Secondary | ICD-10-CM

## 2024-01-14 DIAGNOSIS — I130 Hypertensive heart and chronic kidney disease with heart failure and stage 1 through stage 4 chronic kidney disease, or unspecified chronic kidney disease: Secondary | ICD-10-CM | POA: Diagnosis present

## 2024-01-14 DIAGNOSIS — M1990 Unspecified osteoarthritis, unspecified site: Secondary | ICD-10-CM | POA: Diagnosis present

## 2024-01-14 DIAGNOSIS — R0989 Other specified symptoms and signs involving the circulatory and respiratory systems: Secondary | ICD-10-CM | POA: Diagnosis not present

## 2024-01-14 DIAGNOSIS — Z833 Family history of diabetes mellitus: Secondary | ICD-10-CM

## 2024-01-14 DIAGNOSIS — I2510 Atherosclerotic heart disease of native coronary artery without angina pectoris: Secondary | ICD-10-CM | POA: Diagnosis present

## 2024-01-14 DIAGNOSIS — J441 Chronic obstructive pulmonary disease with (acute) exacerbation: Secondary | ICD-10-CM | POA: Diagnosis not present

## 2024-01-14 DIAGNOSIS — Z7982 Long term (current) use of aspirin: Secondary | ICD-10-CM

## 2024-01-14 DIAGNOSIS — I5033 Acute on chronic diastolic (congestive) heart failure: Secondary | ICD-10-CM | POA: Insufficient documentation

## 2024-01-14 DIAGNOSIS — K219 Gastro-esophageal reflux disease without esophagitis: Secondary | ICD-10-CM | POA: Diagnosis present

## 2024-01-14 DIAGNOSIS — K861 Other chronic pancreatitis: Secondary | ICD-10-CM | POA: Diagnosis present

## 2024-01-14 DIAGNOSIS — Z7984 Long term (current) use of oral hypoglycemic drugs: Secondary | ICD-10-CM

## 2024-01-14 DIAGNOSIS — Z79899 Other long term (current) drug therapy: Secondary | ICD-10-CM

## 2024-01-14 DIAGNOSIS — F4310 Post-traumatic stress disorder, unspecified: Secondary | ICD-10-CM | POA: Diagnosis present

## 2024-01-14 DIAGNOSIS — F32A Depression, unspecified: Secondary | ICD-10-CM | POA: Diagnosis present

## 2024-01-14 DIAGNOSIS — R0902 Hypoxemia: Secondary | ICD-10-CM | POA: Diagnosis not present

## 2024-01-14 DIAGNOSIS — E1122 Type 2 diabetes mellitus with diabetic chronic kidney disease: Secondary | ICD-10-CM | POA: Diagnosis present

## 2024-01-14 DIAGNOSIS — Z825 Family history of asthma and other chronic lower respiratory diseases: Secondary | ICD-10-CM

## 2024-01-14 DIAGNOSIS — C3492 Malignant neoplasm of unspecified part of left bronchus or lung: Secondary | ICD-10-CM

## 2024-01-14 DIAGNOSIS — J9601 Acute respiratory failure with hypoxia: Secondary | ICD-10-CM | POA: Diagnosis present

## 2024-01-14 DIAGNOSIS — C771 Secondary and unspecified malignant neoplasm of intrathoracic lymph nodes: Secondary | ICD-10-CM | POA: Diagnosis present

## 2024-01-14 DIAGNOSIS — Z716 Tobacco abuse counseling: Secondary | ICD-10-CM

## 2024-01-14 DIAGNOSIS — F17210 Nicotine dependence, cigarettes, uncomplicated: Secondary | ICD-10-CM | POA: Diagnosis present

## 2024-01-14 DIAGNOSIS — G8929 Other chronic pain: Secondary | ICD-10-CM | POA: Diagnosis present

## 2024-01-14 DIAGNOSIS — F1020 Alcohol dependence, uncomplicated: Secondary | ICD-10-CM | POA: Diagnosis present

## 2024-01-14 DIAGNOSIS — I509 Heart failure, unspecified: Secondary | ICD-10-CM

## 2024-01-14 DIAGNOSIS — F17290 Nicotine dependence, other tobacco product, uncomplicated: Secondary | ICD-10-CM | POA: Diagnosis present

## 2024-01-14 DIAGNOSIS — E1165 Type 2 diabetes mellitus with hyperglycemia: Secondary | ICD-10-CM | POA: Diagnosis present

## 2024-01-14 DIAGNOSIS — N183 Chronic kidney disease, stage 3 unspecified: Secondary | ICD-10-CM | POA: Diagnosis present

## 2024-01-14 DIAGNOSIS — Z7951 Long term (current) use of inhaled steroids: Secondary | ICD-10-CM

## 2024-01-14 MED ORDER — METHYLPREDNISOLONE SODIUM SUCC 125 MG IJ SOLR
125.0000 mg | Freq: Once | INTRAMUSCULAR | Status: AC
  Administered 2024-01-15: 125 mg via INTRAVENOUS
  Filled 2024-01-14: qty 2

## 2024-01-14 MED ORDER — IPRATROPIUM-ALBUTEROL 0.5-2.5 (3) MG/3ML IN SOLN
3.0000 mL | Freq: Once | RESPIRATORY_TRACT | Status: AC
  Administered 2024-01-15: 3 mL via RESPIRATORY_TRACT
  Filled 2024-01-14: qty 3

## 2024-01-14 NOTE — ED Provider Notes (Signed)
 [facility] Provider Note    Event Date/Time   First MD Initiated Contact with Patient 01/14/24 2356     (approximate)   History   Shortness of Breath (/) and COPD   HPI  Noah Velasquez is a 58 y.o. male brought to the ED via EMS from home with a chief complaint of shortness of breath.  Patient with a history of COPD not on home oxygen who reports progressive shortness of breath x 1 day.  Denies associated fever/chills, abdominal pain, nausea/vomiting or dizziness.  Given 1 albuterol  and 1 DuoNeb prior to arrival with partial relief of symptoms.  Room air saturation mid 80%'s, improved to 100% on 2 L nasal cannula oxygen.     Past Medical History   Past Medical History:  Diagnosis Date   Chronic hip pain    Chronic pancreatitis (HCC)    CKD (chronic kidney disease) stage 3, GFR 30-59 ml/min (HCC)    GERD (gastroesophageal reflux disease)    Hyperlipidemia    Hypertension    Osteoarthritis    Pancreatitis    Pancreatitis    PTSD (post-traumatic stress disorder)    Viral infection of left eye      Active Problem List   Patient Active Problem List   Diagnosis Date Noted   COPD exacerbation (HCC) 01/15/2024   Type 2 diabetes mellitus with diabetic mononeuropathy, without long-term current use of insulin (HCC) 09/07/2023   Overweight with body mass index (BMI) of 28 to 28.9 in adult 04/07/2023   ILD (interstitial lung disease) (HCC) 06/01/2022   Aortic atherosclerosis (HCC) 12/10/2021   Hyperlipidemia associated with type 2 diabetes mellitus (HCC) 01/26/2021   GERD (gastroesophageal reflux disease) 01/26/2021   Chronic pancreatitis (HCC) 01/26/2021   Erectile dysfunction 01/10/2020   Anxiety and depression 06/05/2019   Alcohol use disorder, moderate, in sustained remission (HCC) 04/06/2019   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 Authorizing Provider  amLODipine -olmesartan  (AZOR ) 10-40 MG tablet Take 1 tablet by mouth daily. 12/07/23   Carollynn Cirri, NP  aspirin  EC 81 MG tablet Take 1 tablet (81 mg total) by mouth daily. Swallow whole. Patient not taking: Reported on 12/07/2023 08/01/22   Pokhrel, Laxman, MD  budesonide -formoterol  (SYMBICORT ) 160-4.5 MCG/ACT inhaler Inhale 2 puffs into the lungs 2 (two) times daily. 08/01/22   Pokhrel, Laxman, MD  lovastatin  (MEVACOR ) 10 MG tablet Take 1 tablet (10 mg total) by mouth at bedtime. 12/07/23   Carollynn Cirri, NP  metFORMIN  (GLUCOPHAGE ) 500 MG tablet Take 1 tablet (500 mg total) by mouth daily with breakfast. 12/07/23   Carollynn Cirri, NP  Nebulizers (COMPRESSOR/NEBULIZER) MISC Nebulizer and tubes/mask to use as directed with DuoNeb as needed 06/03/22   Alexander, Natalie, DO  nicotine  (NICODERM CQ  - DOSED IN MG/24 HOURS) 21 mg/24hr patch Place 1 patch (21 mg total) onto the skin daily. 06/04/22   Alexander, Natalie, DO  omeprazole  (PRILOSEC) 20 MG capsule Take 1 capsule (20 mg total) by mouth daily. 12/07/23   Carollynn Cirri, NP  Pancrelipase , Lip-Prot-Amyl, (ZENPEP ) 15000-47000 units CPEP Take 1 capsule (15,000 Units total) by mouth 3 (three) times daily.  12/07/23   Carollynn Cirri, NP  sertraline  (ZOLOFT ) 100 MG tablet TAKE 1 TABLET BY MOUTH ONCE DAILY 10/12/23   Carollynn Cirri, NP  traZODone  (DESYREL ) 150 MG tablet Take 1 tablet (150 mg total) by mouth at bedtime as needed for sleep. 02/02/23   Carollynn Cirri, NP     Allergies  Hctz [hydrochlorothiazide]   Family History   Family History  Problem Relation Age of Onset   Diabetes Mother    Sleep apnea Mother    Emphysema Sister    CAD Neg Hx    Mental illness Neg Hx      Physical  Exam  Triage Vital Signs: ED Triage Vitals  Encounter Vitals Group     BP      Systolic BP Percentile      Diastolic BP Percentile      Pulse      Resp      Temp      Temp src      SpO2      Weight      Height      Head Circumference      Peak Flow      Pain Score      Pain Loc      Pain Education      Exclude from Growth Chart     Updated Vital Signs: BP (!) 163/98   Pulse 86   Temp 97.9 F (36.6 C) (Oral)   Resp 20   Ht 5\' 7"  (1.702 m)   Wt 78.9 kg   SpO2 96%   BMI 27.25 kg/m    General: Awake, moderate distress.  CV:  RRR.  Good peripheral perfusion.  Resp:  Increased effort.  Scattered wheezing. Abd:  Nontender.  No distention.  Other:  No pedal edema.   ED Results / Procedures / Treatments  Labs (all labs ordered are listed, but only abnormal results are displayed) Labs Reviewed  COMPREHENSIVE METABOLIC PANEL WITH GFR - Abnormal; Notable for the following components:      Result Value   Sodium 131 (*)    CO2 21 (*)    Glucose, Bld 177 (*)    Calcium  8.7 (*)    Total Protein 8.7 (*)    Albumin 3.1 (*)    All other components within normal limits  BRAIN NATRIURETIC PEPTIDE - Abnormal; Notable for the following components:   B Natriuretic Peptide 317.8 (*)    All other components within normal limits  TROPONIN I (HIGH SENSITIVITY) - Abnormal; Notable for the following components:   Troponin I (High Sensitivity) 26 (*)    All other components within normal limits  TROPONIN I (HIGH SENSITIVITY) - Abnormal; Notable for the following components:   Troponin I (High Sensitivity) 23 (*)    All other components within normal limits  CBC WITH DIFFERENTIAL/PLATELET  CBC WITH DIFFERENTIAL/PLATELET  HIV ANTIBODY (ROUTINE TESTING W REFLEX)     EKG  ED ECG REPORT I, Dillinger Aston J, the attending physician, personally viewed and interpreted this ECG.   Date: 01/15/2024  EKG Time: 2358  Rate: 87  Rhythm: normal sinus rhythm  Axis: LAD  Intervals:right  bundle branch block  ST&T Change: Nonspecific No significant change from 07/31/2022  ED ECG REPORT I, Kaylla Cobos J, the attending physician, personally viewed and interpreted this ECG.   Date: 01/15/2024  EKG Time: 0034  Rate: 102  Rhythm: sinus tachycardia  Axis: LAD  Intervals:right bundle branch block  ST&T Change: Nonspecific  Patient complains of right upper abdominal/chest cramps.  No change from prior  ED ECG REPORT I, Tayler Heiden J, the attending physician, personally viewed and interpreted this ECG.   Date: 01/15/2024  EKG Time: 0126  Rate: 89  Rhythm: normal sinus rhythm  Axis: LAD  Intervals:right bundle branch block  ST&T Change: Nonspecific Abdominal/chest cramps improved after IV Ativan .  No change from prior.  RADIOLOGY I have independently visualized and interpreted patient's imaging study as well as noted the radiology interpretation:  Chest x-ray: Pulmonary vascular congestion  Official radiology report(s): DG Chest Port 1 View Result Date: 01/15/2024 CLINICAL DATA:  Shortness of breath. History of COPD with inhaler. Decreased oxygen saturation. EXAM: PORTABLE CHEST 1 VIEW COMPARISON:  07/31/2022 FINDINGS: Borderline heart size with mild pulmonary vascular congestion. Diffuse interstitial pattern to the lungs likely representing interstitial edema. Peribronchial thickening consistent with chronic bronchitis. No focal consolidation. No pleural effusion or pneumothorax. Mediastinal contours appear intact. IMPRESSION: Pulmonary vascular congestion with diffuse interstitial pattern to the lungs likely edema. Electronically Signed   By: Boyce Byes M.D.   On: 01/15/2024 00:15     PROCEDURES:  Critical Care performed: Yes, see critical care procedure note(s)  CRITICAL CARE Performed by: Norlene Beavers   Total critical care time: 45 minutes  Critical care time was exclusive of separately billable procedures and treating other patients.  Critical care was  necessary to treat or prevent imminent or life-threatening deterioration.  Critical care was time spent personally by me on the following activities: development of treatment plan with patient and/or surrogate as well as nursing, discussions with consultants, evaluation of patient's response to treatment, examination of patient, obtaining history from patient or surrogate, ordering and performing treatments and interventions, ordering and review of laboratory studies, ordering and review of radiographic studies, pulse oximetry and re-evaluation of patient's condition.   Aaron Aas1-3 Lead EKG Interpretation  Performed by: Norlene Beavers, MD Authorized by: Norlene Beavers, MD     Interpretation: normal     ECG rate:  85   ECG rate assessment: normal     Rhythm: sinus rhythm     Ectopy: none     Conduction: normal   Comments:     Patient placed on cardiac monitor to evaluate for arrhythmias    MEDICATIONS ORDERED IN ED: Medications  thiamine  (VITAMIN B1) tablet 100 mg (has no administration in time range)  LORazepam  (ATIVAN ) injection 0-4 mg ( Intravenous Not Given 01/15/24 0639)  enoxaparin  (LOVENOX ) injection 40 mg (has no administration in time range)  methylPREDNISolone  sodium succinate (SOLU-MEDROL ) 40 mg/mL injection 40 mg (has no administration in time range)    Followed by  predniSONE  (DELTASONE ) tablet 40 mg (has no administration in time range)  0.9 %  sodium chloride  infusion (has no administration in time range)  acetaminophen  (TYLENOL ) tablet 650 mg (has no administration in time range)    Or  acetaminophen  (TYLENOL ) suppository 650 mg (has no administration in time range)  traZODone  (DESYREL ) tablet 25 mg (has no administration in time range)  magnesium  hydroxide (MILK OF MAGNESIA) suspension 30 mL (has no administration in time range)  ondansetron  (ZOFRAN ) tablet 4 mg (has no administration in time range)    Or  ondansetron  (ZOFRAN ) injection 4 mg (has no administration in time  range)  guaiFENesin  (MUCINEX ) 12 hr tablet 600 mg (has no administration in time range)  chlorpheniramine-HYDROcodone  (TUSSIONEX) 10-8 MG/5ML suspension 5 mL (has no administration in time range)  ipratropium-albuterol  (DUONEB) 0.5-2.5 (3) MG/3ML nebulizer solution  3 mL (has no administration in time range)  cefTRIAXone  (ROCEPHIN ) 1 g in sodium chloride  0.9 % 100 mL IVPB (has no administration in time range)  ipratropium-albuterol  (DUONEB) 0.5-2.5 (3) MG/3ML nebulizer solution 3 mL (3 mLs Nebulization Given 01/15/24 0014)  methylPREDNISolone  sodium succinate (SOLU-MEDROL ) 125 mg/2 mL injection 125 mg (125 mg Intravenous Given 01/15/24 0013)  famotidine  (PEPCID ) IVPB 20 mg premix (0 mg Intravenous Stopped 01/15/24 0210)  LORazepam  (ATIVAN ) injection 0.5 mg (0.5 mg Intravenous Given 01/15/24 0128)  LORazepam  (ATIVAN ) injection 1 mg (1 mg Intravenous Given 01/15/24 0210)     IMPRESSION / MDM / ASSESSMENT AND PLAN / ED COURSE  I reviewed the triage vital signs and the nursing notes.                             58 year old male presenting with shortness of breath and hypoxia. Differential includes, but is not limited to, viral syndrome, bronchitis including COPD exacerbation, pneumonia, reactive airway disease including asthma, CHF including exacerbation with or without pulmonary/interstitial edema, pneumothorax, ACS, thoracic trauma, and pulmonary embolism.  I personally reviewed patient's records and note a PCP office visit on 12/07/2023 for follow-up diabetes, hypertension, ILD, GERD, alcohol use disorder.  Patient's presentation is most consistent with acute complicated illness / injury requiring diagnostic workup.  The patient is on the cardiac monitor to evaluate for evidence of arrhythmia and/or significant heart rate changes.  Obtain cardiac panel, chest x-ray.  Administer 125 mg IV Solu-Medrol , another DuoNeb.  Placed on CIWA for history of alcohol use disorder.  Anticipate  hospitalization.  Clinical Course as of 01/15/24 1610  Sat Jan 15, 2024  0037 Patient complaining of chest cramps.  Repeat EKG unchanged.  Patient is at the edge of his bed standing up wincing and holding his right chest with waves of cramps.  Unlikely ACS.  Will administer IV Ativan . [JS]  0221 Patient soundly asleep.  Wheezing improved.  Remains on 2 L nasal cannula oxygen.  Awaiting results of metabolic panel. [JS]  0257 Downtrending troponin, unremarkable metabolic panel.  At this time will consult hospitalist services for evaluation and admission. [JS]    Clinical Course User Index [JS] Norlene Beavers, MD     FINAL CLINICAL IMPRESSION(S) / ED DIAGNOSES   Final diagnoses:  COPD exacerbation (HCC)  Hypoxia  Pulmonary vascular congestion     Rx / DC Orders   ED Discharge Orders     None        Note:  This document was prepared using Dragon voice recognition software and may include unintentional dictation errors.   Ebony Rickel J, MD 01/15/24 [phone]

## 2024-01-14 NOTE — ED Provider Notes (Incomplete)
 [facility] Provider Note    Event Date/Time   First MD Initiated Contact with Patient 01/14/24 2356     (approximate)   History   Shortness of Breath (/) and COPD   HPI  Noah Velasquez is a 58 y.o. male brought to the ED via EMS from home with a chief complaint of shortness of breath.  Patient with a history of COPD not on home oxygen who reports progressive shortness of breath x 1 day.  Denies associated fever/chills, abdominal pain, nausea/vomiting or dizziness.  Given 1 albuterol  and 1 DuoNeb prior to arrival with partial relief of symptoms.  Room air saturation mid 80%'s, improved to 100% on 2 L nasal cannula oxygen.     Past Medical History   Past Medical History:  Diagnosis Date  . Chronic hip pain   . Chronic pancreatitis (HCC)   . CKD (chronic kidney disease) stage 3, GFR 30-59 ml/min (HCC)   . GERD (gastroesophageal reflux disease)   . Hyperlipidemia   . Hypertension   . Osteoarthritis   . Pancreatitis   . Pancreatitis   . PTSD (post-traumatic stress disorder)   . Viral infection of left eye      Active Problem List   Patient Active Problem List   Diagnosis Date Noted  . Type 2 diabetes mellitus with diabetic mononeuropathy, without long-term current use of insulin (HCC) 09/07/2023  . Overweight with body mass index (BMI) of 28 to 28.9 in adult 04/07/2023  . ILD (interstitial lung disease) (HCC) 06/01/2022  . Aortic atherosclerosis (HCC) 12/10/2021  . Hyperlipidemia associated with type 2 diabetes mellitus (HCC) 01/26/2021  . GERD (gastroesophageal reflux disease) 01/26/2021  . Chronic pancreatitis (HCC) 01/26/2021  . Erectile dysfunction 01/10/2020  . Anxiety and depression 06/05/2019  . Alcohol use disorder, moderate, in sustained remission (HCC) 04/06/2019  . 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 Essential hypertension 02/15/2015  . Osteoarthritis 08/01/2012  . PTSD (post-traumatic stress disorder) 06/12/2009      Past Surgical History   Past Surgical History:  Procedure Laterality Date  . pancreatic stent placement       Home Medications   Prior to Admission medications   Medication Sig Start Date End Date Taking? Authorizing Provider  amLODipine -olmesartan  (AZOR ) 10-40 MG tablet Take 1 tablet by mouth daily. 12/07/23   Carollynn Cirri, NP  aspirin  EC 81 MG tablet Take 1 tablet (81 mg total) by mouth daily. Swallow whole. Patient not taking: Reported on 12/07/2023 08/01/22   Rosena Conradi, MD  budesonide -formoterol  (SYMBICORT ) 160-4.5 MCG/ACT inhaler Inhale 2 puffs into the lungs 2 (two) times daily. 08/01/22   Pokhrel, Laxman, MD  lovastatin  (MEVACOR ) 10 MG tablet Take 1 tablet (10 mg total) by mouth at bedtime. 12/07/23   Carollynn Cirri, NP  metFORMIN  (GLUCOPHAGE ) 500 MG tablet Take 1 tablet (500 mg total) by mouth daily with breakfast. 12/07/23   Carollynn Cirri, NP  Nebulizers (COMPRESSOR/NEBULIZER) MISC Nebulizer and tubes/mask to use as directed with DuoNeb as needed 06/03/22   Alexander, Natalie, DO  nicotine  (NICODERM CQ  - DOSED IN MG/24 HOURS) 21 mg/24hr patch Place 1 patch (21 mg total) onto the skin daily. 06/04/22   Alexander, Natalie, DO  omeprazole  (PRILOSEC) 20 MG capsule Take 1 capsule (20 mg total) by mouth daily. 12/07/23   Carollynn Cirri, NP  Pancrelipase , Lip-Prot-Amyl, (ZENPEP ) 15000-47000 units CPEP Take 1 capsule (15,000 Units total) by mouth 3 (three) times daily. 12/07/23   Carollynn Cirri,  NP  sertraline  (ZOLOFT ) 100 MG tablet TAKE 1 TABLET BY MOUTH ONCE DAILY 10/12/23   Carollynn Cirri, NP  traZODone  (DESYREL ) 150 MG tablet Take 1 tablet (150 mg total) by mouth at bedtime as needed for sleep. 02/02/23   Carollynn Cirri, NP     Allergies  Hctz [hydrochlorothiazide]   Family History   Family History  Problem Relation Age of Onset  . Diabetes Mother   . Sleep apnea Mother   . Emphysema Sister   . CAD Neg Hx   . Mental illness Neg Hx      Physical Exam   Triage Vital Signs: ED Triage Vitals  Encounter Vitals Group     BP      Systolic BP Percentile      Diastolic BP Percentile      Pulse      Resp      Temp      Temp src      SpO2      Weight      Height      Head Circumference      Peak Flow      Pain Score      Pain Loc      Pain Education      Exclude from Growth Chart     Updated Vital Signs: There were no vitals taken for this visit.   General: Awake, moderate distress.  CV:  *** Good peripheral perfusion.  Resp:  Increased effort.  Abd:  No distention.  Other:     ED Results / Procedures / Treatments  Labs (all labs ordered are listed, but only abnormal results are displayed) Labs Reviewed  CBC WITH DIFFERENTIAL/PLATELET  COMPREHENSIVE METABOLIC PANEL WITH GFR  BRAIN NATRIURETIC PEPTIDE  TROPONIN I (HIGH SENSITIVITY)     EKG  ***   RADIOLOGY *** {You MUST document your own interpretation of imaging, as well as the fact that you reviewed the radiologist's report!:1}  Official radiology report(s): No results found.   PROCEDURES:  Critical Care performed: {CriticalCareYesNo:19197::"Yes, see critical care procedure note(s)","No"}  Procedures   MEDICATIONS ORDERED IN ED: Medications  ipratropium-albuterol  (DUONEB) 0.5-2.5 (3) MG/3ML nebulizer solution 3 mL (has no administration in time range)  methylPREDNISolone  sodium succinate (SOLU-MEDROL ) 125 mg/2 mL injection 125 mg (has no administration in time range)     IMPRESSION / MDM / ASSESSMENT AND PLAN / ED COURSE  I reviewed the triage vital signs and the nursing notes.                              Differential diagnosis includes, but is not limited to, ***  Patient's presentation is most consistent with {EM COPA:27473}  {If the patient is on the monitor, remove the brackets and asterisks on the sentence below and remember to document it as a Procedure as well. Otherwise delete the sentence below:1} {**The patient is on the cardiac  monitor to evaluate for evidence of arrhythmia and/or significant heart rate changes.**}  {Remember to include, when applicable, any/all of the following data: independent review of imaging independent review of labs (comment specifically on pertinent positives and negatives) review of specific prior hospitalizations, PCP/specialist notes, etc. discuss meds given and prescribed document any discussion with consultants (including hospitalists) any clinical decision tools you used and why (PECARN, NEXUS, etc.) did you consider admitting the patient? document social determinants of health affecting patient's care (homelessness, inability to follow up  in a timely fashion, etc) document any pre-existing conditions increasing risk on current visit (e.g. diabetes and HTN increasing danger of high-risk chest pain/ACS) describes what meds you gave (especially parenteral) and why any other interventions?:1}      FINAL CLINICAL IMPRESSION(S) / ED DIAGNOSES   Final diagnoses:  COPD exacerbation (HCC)  Hypoxia     Rx / DC Orders   ED Discharge Orders     None        Note:  This document was prepared using Dragon voice recognition software and may include unintentional dictation errors.

## 2024-01-14 NOTE — ED Triage Notes (Signed)
 Pt in via [facility] from home, reports acute onset SOB approximately 2 hours ago.  Hx COPD w/ prescribed inhaler and nebulizers; taken at home w/out any relief.  EMS reports RA saturation of 84%, on 2L nasal cannula upon arrival.    Per EMS: 18G RAC 1 Duoneb 1 Albuterol   NAD noted at this time.

## 2024-01-15 ENCOUNTER — Encounter: Payer: Self-pay | Admitting: Emergency Medicine

## 2024-01-15 ENCOUNTER — Inpatient Hospital Stay
Admit: 2024-01-15 | Discharge: 2024-01-15 | Disposition: A | Discharge status: Home / Self Care | Attending: Internal Medicine

## 2024-01-15 ENCOUNTER — Other Ambulatory Visit: Payer: Self-pay

## 2024-01-15 ENCOUNTER — Emergency Department

## 2024-01-15 DIAGNOSIS — I509 Heart failure, unspecified: Secondary | ICD-10-CM

## 2024-01-15 DIAGNOSIS — K861 Other chronic pancreatitis: Secondary | ICD-10-CM | POA: Diagnosis not present

## 2024-01-15 DIAGNOSIS — Z833 Family history of diabetes mellitus: Secondary | ICD-10-CM | POA: Diagnosis not present

## 2024-01-15 DIAGNOSIS — F32A Depression, unspecified: Secondary | ICD-10-CM | POA: Diagnosis not present

## 2024-01-15 DIAGNOSIS — I130 Hypertensive heart and chronic kidney disease with heart failure and stage 1 through stage 4 chronic kidney disease, or unspecified chronic kidney disease: Secondary | ICD-10-CM | POA: Diagnosis not present

## 2024-01-15 DIAGNOSIS — Z79899 Other long term (current) drug therapy: Secondary | ICD-10-CM | POA: Diagnosis not present

## 2024-01-15 DIAGNOSIS — Z825 Family history of asthma and other chronic lower respiratory diseases: Secondary | ICD-10-CM | POA: Diagnosis not present

## 2024-01-15 DIAGNOSIS — F4310 Post-traumatic stress disorder, unspecified: Secondary | ICD-10-CM | POA: Diagnosis not present

## 2024-01-15 DIAGNOSIS — F17210 Nicotine dependence, cigarettes, uncomplicated: Secondary | ICD-10-CM | POA: Diagnosis not present

## 2024-01-15 DIAGNOSIS — I2510 Atherosclerotic heart disease of native coronary artery without angina pectoris: Secondary | ICD-10-CM | POA: Diagnosis not present

## 2024-01-15 DIAGNOSIS — C771 Secondary and unspecified malignant neoplasm of intrathoracic lymph nodes: Secondary | ICD-10-CM | POA: Diagnosis not present

## 2024-01-15 DIAGNOSIS — R918 Other nonspecific abnormal finding of lung field: Secondary | ICD-10-CM | POA: Diagnosis not present

## 2024-01-15 DIAGNOSIS — J9601 Acute respiratory failure with hypoxia: Secondary | ICD-10-CM | POA: Diagnosis not present

## 2024-01-15 DIAGNOSIS — Z7984 Long term (current) use of oral hypoglycemic drugs: Secondary | ICD-10-CM | POA: Diagnosis not present

## 2024-01-15 DIAGNOSIS — I5031 Acute diastolic (congestive) heart failure: Secondary | ICD-10-CM | POA: Diagnosis not present

## 2024-01-15 DIAGNOSIS — Z7951 Long term (current) use of inhaled steroids: Secondary | ICD-10-CM | POA: Diagnosis not present

## 2024-01-15 DIAGNOSIS — F1020 Alcohol dependence, uncomplicated: Secondary | ICD-10-CM | POA: Diagnosis present

## 2024-01-15 DIAGNOSIS — I5033 Acute on chronic diastolic (congestive) heart failure: Secondary | ICD-10-CM | POA: Diagnosis not present

## 2024-01-15 DIAGNOSIS — Z7982 Long term (current) use of aspirin: Secondary | ICD-10-CM | POA: Diagnosis not present

## 2024-01-15 DIAGNOSIS — C3492 Malignant neoplasm of unspecified part of left bronchus or lung: Secondary | ICD-10-CM | POA: Diagnosis not present

## 2024-01-15 DIAGNOSIS — J441 Chronic obstructive pulmonary disease with (acute) exacerbation: Principal | ICD-10-CM | POA: Diagnosis present

## 2024-01-15 DIAGNOSIS — E1122 Type 2 diabetes mellitus with diabetic chronic kidney disease: Secondary | ICD-10-CM | POA: Diagnosis not present

## 2024-01-15 DIAGNOSIS — E785 Hyperlipidemia, unspecified: Secondary | ICD-10-CM | POA: Diagnosis not present

## 2024-01-15 DIAGNOSIS — N183 Chronic kidney disease, stage 3 unspecified: Secondary | ICD-10-CM | POA: Diagnosis not present

## 2024-01-15 DIAGNOSIS — R0902 Hypoxemia: Secondary | ICD-10-CM | POA: Diagnosis not present

## 2024-01-15 DIAGNOSIS — Z716 Tobacco abuse counseling: Secondary | ICD-10-CM | POA: Diagnosis not present

## 2024-01-15 DIAGNOSIS — R0989 Other specified symptoms and signs involving the circulatory and respiratory systems: Secondary | ICD-10-CM | POA: Diagnosis not present

## 2024-01-15 DIAGNOSIS — F17290 Nicotine dependence, other tobacco product, uncomplicated: Secondary | ICD-10-CM | POA: Diagnosis not present

## 2024-01-15 DIAGNOSIS — E1165 Type 2 diabetes mellitus with hyperglycemia: Secondary | ICD-10-CM | POA: Diagnosis not present

## 2024-01-15 DIAGNOSIS — R0602 Shortness of breath: Secondary | ICD-10-CM | POA: Diagnosis not present

## 2024-01-15 DIAGNOSIS — J449 Chronic obstructive pulmonary disease, unspecified: Secondary | ICD-10-CM | POA: Diagnosis not present

## 2024-01-15 LAB — EXPECTORATED SPUTUM ASSESSMENT W GRAM STAIN, RFLX TO RESP C

## 2024-01-15 LAB — URINE DRUG SCREEN, QUALITATIVE (ARMC ONLY)
Amphetamines, Ur Screen: NOT DETECTED
Barbiturates, Ur Screen: NOT DETECTED
Benzodiazepine, Ur Scrn: NOT DETECTED
Cannabinoid 50 Ng, Ur ~~LOC~~: NOT DETECTED
Cocaine Metabolite,Ur ~~LOC~~: POSITIVE — AB
MDMA (Ecstasy)Ur Screen: NOT DETECTED
Methadone Scn, Ur: NOT DETECTED
Opiate, Ur Screen: NOT DETECTED
Phencyclidine (PCP) Ur S: NOT DETECTED
Tricyclic, Ur Screen: NOT DETECTED

## 2024-01-15 LAB — COMPREHENSIVE METABOLIC PANEL WITH GFR
ALT: 15 U/L (ref 0–44)
AST: 25 U/L (ref 15–41)
Albumin: 3.1 g/dL — ABNORMAL LOW (ref 3.5–5.0)
Alkaline Phosphatase: 62 U/L (ref 38–126)
Anion gap: 9 (ref 5–15)
BUN: 11 mg/dL (ref 6–20)
CO2: 21 mmol/L — ABNORMAL LOW (ref 22–32)
Calcium: 8.7 mg/dL — ABNORMAL LOW (ref 8.9–10.3)
Chloride: 101 mmol/L (ref 98–111)
Creatinine, Ser: 0.97 mg/dL (ref 0.61–1.24)
GFR, Estimated: >60 mL/min (ref >60)
Glucose, Bld: 177 mg/dL — ABNORMAL HIGH (ref 70–99)
Potassium: 3.9 mmol/L (ref 3.5–5.1)
Sodium: 131 mmol/L — ABNORMAL LOW (ref 135–145)
Total Bilirubin: 0.4 mg/dL (ref 0.0–1.2)
Total Protein: 8.7 g/dL — ABNORMAL HIGH (ref 6.5–8.1)

## 2024-01-15 LAB — GLUCOSE, CAPILLARY
Glucose-Capillary: 224 mg/dL — ABNORMAL HIGH (ref 70–99)
Glucose-Capillary: 393 mg/dL — ABNORMAL HIGH (ref 70–99)
Glucose-Capillary: 163 mg/dL — ABNORMAL HIGH (ref 70–99)
Glucose-Capillary: 195 mg/dL — ABNORMAL HIGH (ref 70–99)

## 2024-01-15 LAB — ECHOCARDIOGRAM COMPLETE
AR max vel: 2.38 cm2
AV Area VTI: 2.4 cm2
AV Area mean vel: 2.3 cm2
AV Mean grad: 8 mmHg
AV Peak grad: 14.7 mmHg
Ao pk vel: 1.92 m/s
Area-P 1/2: 4.96 cm2
Height: 67 in
P 1/2 time: 750 ms
S' Lateral: 3.5 cm
Single Plane A4C EF: 58.8 %
Weight: 2784 [oz_av]

## 2024-01-15 LAB — CBC WITH DIFFERENTIAL/PLATELET
Abs Immature Granulocytes: 0.02 10*3/uL (ref 0.00–0.07)
Basophils Absolute: 0.1 10*3/uL (ref 0.0–0.1)
Basophils Relative: 1 %
Eosinophils Absolute: 0.4 10*3/uL (ref 0.0–0.5)
Eosinophils Relative: 5 %
HCT: 41.4 % (ref 39.0–52.0)
Hemoglobin: 14.2 g/dL (ref 13.0–17.0)
Immature Granulocytes: 0 %
Lymphocytes Relative: 30 %
Lymphs Abs: 2.4 10*3/uL (ref 0.7–4.0)
MCH: 31.5 pg (ref 26.0–34.0)
MCHC: 34.3 g/dL (ref 30.0–36.0)
MCV: 91.8 fL (ref 80.0–100.0)
Monocytes Absolute: 0.5 10*3/uL (ref 0.1–1.0)
Monocytes Relative: 7 %
Neutro Abs: 4.5 10*3/uL (ref 1.7–7.7)
Neutrophils Relative %: 57 %
Platelets: 271 10*3/uL (ref 150–400)
RBC: 4.51 MIL/uL (ref 4.22–5.81)
RDW: 13.4 % (ref 11.5–15.5)
WBC: 7.9 10*3/uL (ref 4.0–10.5)
nRBC: 0 % (ref 0.0–0.2)

## 2024-01-15 LAB — URINALYSIS, COMPLETE (UACMP) WITH MICROSCOPIC
Bacteria, UA: NONE SEEN
Bilirubin Urine: NEGATIVE
Glucose, UA: >=500 mg/dL — AB
Hgb urine dipstick: NEGATIVE
Ketones, ur: NEGATIVE mg/dL
Leukocytes,Ua: NEGATIVE
Nitrite: NEGATIVE
Protein, ur: NEGATIVE mg/dL
Specific Gravity, Urine: 1.007 (ref 1.005–1.030)
Squamous Epithelial / HPF: 0 /HPF (ref 0–5)
WBC, UA: 0 WBC/hpf (ref 0–5)
pH: 5 (ref 5.0–8.0)

## 2024-01-15 LAB — TROPONIN I (HIGH SENSITIVITY)
Troponin I (High Sensitivity): 26 ng/L — ABNORMAL HIGH (ref <18)
Troponin I (High Sensitivity): 23 ng/L — ABNORMAL HIGH (ref <18)

## 2024-01-15 LAB — BRAIN NATRIURETIC PEPTIDE: B Natriuretic Peptide: 317.8 pg/mL — ABNORMAL HIGH (ref 0.0–100.0)

## 2024-01-15 LAB — HIV ANTIBODY (ROUTINE TESTING W REFLEX): HIV Screen 4th Generation wRfx: NONREACTIVE

## 2024-01-15 LAB — TSH: TSH: 1.165 u[IU]/mL (ref 0.350–4.500)

## 2024-01-15 MED ORDER — INSULIN ASPART 100 UNIT/ML IJ SOLN: 0.0000 [IU] | Freq: Three times a day (TID) | INTRAMUSCULAR | Status: DC

## 2024-01-15 MED ORDER — AMLODIPINE BESYLATE 10 MG PO TABS
10.0000 mg | ORAL_TABLET | Freq: Every day | ORAL | Status: DC
  Administered 2024-01-15 – 2024-01-18 (×4): 10 mg via ORAL
  Filled 2024-01-15 (×4): qty 1

## 2024-01-15 MED ORDER — SODIUM CHLORIDE 0.9% FLUSH
3.0000 mL | INTRAVENOUS | Status: DC | PRN
  Administered 2024-01-15: 3 mL via INTRAVENOUS

## 2024-01-15 MED ORDER — METFORMIN HCL 500 MG PO TABS
500.0000 mg | ORAL_TABLET | Freq: Every day | ORAL | Status: DC
  Administered 2024-01-15 – 2024-01-18 (×4): 500 mg via ORAL
  Filled 2024-01-15 (×4): qty 1

## 2024-01-15 MED ORDER — IPRATROPIUM-ALBUTEROL 0.5-2.5 (3) MG/3ML IN SOLN
3.0000 mL | Freq: Three times a day (TID) | RESPIRATORY_TRACT | Status: DC
  Administered 2024-01-15 – 2024-01-16 (×3): 3 mL via RESPIRATORY_TRACT
  Filled 2024-01-15 (×4): qty 3

## 2024-01-15 MED ORDER — PREDNISONE 20 MG PO TABS
40.0000 mg | ORAL_TABLET | Freq: Every day | ORAL | Status: DC
  Administered 2024-01-16 – 2024-01-18 (×3): 40 mg via ORAL
  Filled 2024-01-15 (×3): qty 2

## 2024-01-15 MED ORDER — FLUTICASONE FUROATE-VILANTEROL 200-25 MCG/ACT IN AEPB
1.0000 | INHALATION_SPRAY | Freq: Every day | RESPIRATORY_TRACT | Status: DC
  Administered 2024-01-15 – 2024-01-17 (×3): 1 via RESPIRATORY_TRACT
  Filled 2024-01-15: qty 28

## 2024-01-15 MED ORDER — TRAZODONE HCL 50 MG PO TABS: 25.0000 mg | ORAL_TABLET | Freq: Every evening | ORAL | Status: DC | PRN

## 2024-01-15 MED ORDER — SODIUM CHLORIDE 0.9 % IV SOLN
INTRAVENOUS | Status: DC
Start: 2024-01-15 — End: 2024-01-15

## 2024-01-15 MED ORDER — IRBESARTAN 150 MG PO TABS
300.0000 mg | ORAL_TABLET | Freq: Every day | ORAL | Status: DC
  Administered 2024-01-15 – 2024-01-18 (×4): 300 mg via ORAL
  Filled 2024-01-15 (×4): qty 2

## 2024-01-15 MED ORDER — METHYLPREDNISOLONE SODIUM SUCC 40 MG IJ SOLR
40.0000 mg | Freq: Two times a day (BID) | INTRAMUSCULAR | Status: AC
  Administered 2024-01-15 (×2): 40 mg via INTRAVENOUS
  Filled 2024-01-15 (×2): qty 1

## 2024-01-15 MED ORDER — SODIUM CHLORIDE 0.9 % IV SOLN
500.0000 mg | INTRAVENOUS | Status: AC
  Administered 2024-01-15: 500 mg via INTRAVENOUS
  Filled 2024-01-15: qty 5

## 2024-01-15 MED ORDER — PANTOPRAZOLE SODIUM 40 MG PO TBEC
40.0000 mg | DELAYED_RELEASE_TABLET | Freq: Every day | ORAL | Status: DC
  Administered 2024-01-15 – 2024-01-18 (×4): 40 mg via ORAL
  Filled 2024-01-15 (×4): qty 1

## 2024-01-15 MED ORDER — AZITHROMYCIN 250 MG PO TABS
500.0000 mg | ORAL_TABLET | Freq: Every day | ORAL | Status: DC
  Administered 2024-01-16: 500 mg via ORAL
  Filled 2024-01-15: qty 2

## 2024-01-15 MED ORDER — SODIUM CHLORIDE 0.9 % IV SOLN
1.0000 g | INTRAVENOUS | Status: DC
  Administered 2024-01-15 – 2024-01-16 (×2): 1 g via INTRAVENOUS
  Filled 2024-01-15 (×2): qty 10

## 2024-01-15 MED ORDER — GUAIFENESIN ER 600 MG PO TB12
600.0000 mg | ORAL_TABLET | Freq: Two times a day (BID) | ORAL | Status: DC
  Administered 2024-01-15 – 2024-01-18 (×7): 600 mg via ORAL
  Filled 2024-01-15 (×7): qty 1

## 2024-01-15 MED ORDER — FAMOTIDINE IN NACL 20-0.9 MG/50ML-% IV SOLN
20.0000 mg | Freq: Once | INTRAVENOUS | Status: AC
  Administered 2024-01-15: 20 mg via INTRAVENOUS
  Filled 2024-01-15: qty 50

## 2024-01-15 MED ORDER — PRAVASTATIN SODIUM 20 MG PO TABS
10.0000 mg | ORAL_TABLET | Freq: Every day | ORAL | Status: DC
  Administered 2024-01-15 – 2024-01-17 (×3): 10 mg via ORAL
  Filled 2024-01-15 (×3): qty 1

## 2024-01-15 MED ORDER — IPRATROPIUM-ALBUTEROL 0.5-2.5 (3) MG/3ML IN SOLN
3.0000 mL | Freq: Four times a day (QID) | RESPIRATORY_TRACT | Status: DC
  Administered 2024-01-15: 3 mL via RESPIRATORY_TRACT
  Filled 2024-01-15: qty 3

## 2024-01-15 MED ORDER — HYDROCODONE-ACETAMINOPHEN 7.5-325 MG PO TABS
1.0000 | ORAL_TABLET | Freq: Four times a day (QID) | ORAL | Status: DC | PRN
  Administered 2024-01-15 – 2024-01-17 (×7): 1 via ORAL
  Filled 2024-01-15 (×7): qty 1

## 2024-01-15 MED ORDER — THIAMINE HCL 100 MG PO TABS
100.0000 mg | ORAL_TABLET | Freq: Every day | ORAL | Status: DC
  Administered 2024-01-15 – 2024-01-18 (×4): 100 mg via ORAL
  Filled 2024-01-15 (×8): qty 1

## 2024-01-15 MED ORDER — INSULIN ASPART 100 UNIT/ML IJ SOLN
0.0000 [IU] | Freq: Three times a day (TID) | INTRAMUSCULAR | Status: DC
  Administered 2024-01-15: 3 [IU] via SUBCUTANEOUS
  Administered 2024-01-15: 5 [IU] via SUBCUTANEOUS
  Administered 2024-01-15: 11 [IU] via SUBCUTANEOUS
  Administered 2024-01-16 (×3): 2 [IU] via SUBCUTANEOUS
  Administered 2024-01-17 (×2): 5 [IU] via SUBCUTANEOUS
  Administered 2024-01-18: 3 [IU] via SUBCUTANEOUS
  Filled 2024-01-15 (×9): qty 1

## 2024-01-15 MED ORDER — ENOXAPARIN SODIUM 40 MG/0.4ML IJ SOSY
40.0000 mg | PREFILLED_SYRINGE | INTRAMUSCULAR | Status: DC
  Administered 2024-01-15 – 2024-01-18 (×4): 40 mg via SUBCUTANEOUS
  Filled 2024-01-15 (×4): qty 0.4

## 2024-01-15 MED ORDER — FUROSEMIDE 10 MG/ML IJ SOLN
40.0000 mg | Freq: Once | INTRAMUSCULAR | Status: AC
  Administered 2024-01-15: 40 mg via INTRAVENOUS
  Filled 2024-01-15: qty 4

## 2024-01-15 MED ORDER — ONDANSETRON HCL 4 MG PO TABS: 4.0000 mg | ORAL_TABLET | Freq: Four times a day (QID) | ORAL | Status: DC | PRN

## 2024-01-15 MED ORDER — ACETAMINOPHEN 650 MG RE SUPP: 650.0000 mg | Freq: Four times a day (QID) | RECTAL | Status: DC | PRN

## 2024-01-15 MED ORDER — ALBUTEROL SULFATE (2.5 MG/3ML) 0.083% IN NEBU
3.0000 mL | INHALATION_SOLUTION | Freq: Four times a day (QID) | RESPIRATORY_TRACT | Status: DC | PRN
Start: 2024-01-15 — End: 2024-01-17
  Administered 2024-01-16: 3 mL via RESPIRATORY_TRACT
  Filled 2024-01-15: qty 3

## 2024-01-15 MED ORDER — HYDROCOD POLI-CHLORPHE POLI ER 10-8 MG/5ML PO SUER: 5.0000 mL | Freq: Two times a day (BID) | ORAL | Status: DC | PRN

## 2024-01-15 MED ORDER — ASPIRIN 81 MG PO TBEC
81.0000 mg | DELAYED_RELEASE_TABLET | Freq: Every day | ORAL | Status: DC
  Administered 2024-01-15 – 2024-01-18 (×4): 81 mg via ORAL
  Filled 2024-01-15 (×4): qty 1

## 2024-01-15 MED ORDER — LORAZEPAM 2 MG/ML IJ SOLN
1.0000 mg | Freq: Once | INTRAMUSCULAR | Status: AC
  Administered 2024-01-15: 1 mg via INTRAVENOUS
  Filled 2024-01-15: qty 1

## 2024-01-15 MED ORDER — NICOTINE 21 MG/24HR TD PT24
21.0000 mg | MEDICATED_PATCH | Freq: Every day | TRANSDERMAL | Status: DC
  Administered 2024-01-15 – 2024-01-18 (×4): 21 mg via TRANSDERMAL
  Filled 2024-01-15 (×4): qty 1

## 2024-01-15 MED ORDER — ONDANSETRON HCL 4 MG/2ML IJ SOLN: 4.0000 mg | Freq: Four times a day (QID) | INTRAMUSCULAR | Status: DC | PRN

## 2024-01-15 MED ORDER — ADULT MULTIVITAMIN W/MINERALS CH
1.0000 | ORAL_TABLET | Freq: Every day | ORAL | Status: DC
  Administered 2024-01-15 – 2024-01-18 (×4): 1 via ORAL
  Filled 2024-01-15 (×4): qty 1

## 2024-01-15 MED ORDER — LORAZEPAM 2 MG/ML IJ SOLN
0.5000 mg | Freq: Once | INTRAMUSCULAR | Status: AC
  Administered 2024-01-15: 0.5 mg via INTRAVENOUS
  Filled 2024-01-15: qty 1

## 2024-01-15 MED ORDER — SERTRALINE HCL 50 MG PO TABS
100.0000 mg | ORAL_TABLET | Freq: Every day | ORAL | Status: DC
  Administered 2024-01-15 – 2024-01-18 (×4): 100 mg via ORAL
  Filled 2024-01-15 (×4): qty 2

## 2024-01-15 MED ORDER — GLUCERNA SHAKE PO LIQD
237.0000 mL | Freq: Three times a day (TID) | ORAL | Status: DC
  Administered 2024-01-15 – 2024-01-18 (×9): 237 mL via ORAL

## 2024-01-15 MED ORDER — ACETAMINOPHEN 325 MG PO TABS
650.0000 mg | ORAL_TABLET | Freq: Four times a day (QID) | ORAL | Status: DC | PRN
  Administered 2024-01-15: 650 mg via ORAL
  Filled 2024-01-15: qty 2

## 2024-01-15 MED ORDER — LORAZEPAM 2 MG/ML IJ SOLN: 0.0000 mg | Freq: Four times a day (QID) | INTRAMUSCULAR | Status: AC

## 2024-01-15 MED ORDER — AMLODIPINE-OLMESARTAN 10-40 MG PO TABS: 1.0000 | ORAL_TABLET | Freq: Every day | ORAL | Status: DC

## 2024-01-15 MED ORDER — INSULIN GLARGINE-YFGN 100 UNIT/ML ~~LOC~~ SOLN
10.0000 [IU] | SUBCUTANEOUS | Status: DC
  Administered 2024-01-15 – 2024-01-18 (×4): 10 [IU] via SUBCUTANEOUS
  Filled 2024-01-15 (×4): qty 0.1

## 2024-01-15 MED ORDER — MAGNESIUM HYDROXIDE 400 MG/5ML PO SUSP: 30.0000 mL | Freq: Every day | ORAL | Status: DC | PRN

## 2024-01-15 MED ORDER — SODIUM CHLORIDE 0.9 % IV SOLN: 250.0000 mL | INTRAVENOUS | Status: DC | PRN

## 2024-01-15 MED ORDER — SODIUM CHLORIDE 0.9% FLUSH
3.0000 mL | Freq: Two times a day (BID) | INTRAVENOUS | Status: DC
  Administered 2024-01-15 – 2024-01-16 (×3): 3 mL via INTRAVENOUS

## 2024-01-15 NOTE — Evaluation (Signed)
 Physical Therapy Evaluation Patient Details Name: Noah Velasquez MRN: [id number] DOB: [birth year] Today's Date: 01/15/2024  History of Present Illness  Pt is a 58 yo male that presented to ED for SOB, workup for acute on chronic CHF, acute COPD exacerbation. PMH of COPD, HTN, DM, chronic pancreatitis, etoh, PTSD.  Clinical Impression  Pt A&Ox4, denied pain. Per pt at baseline he lives alone, modI with [facility], uses power chair for out of house, friend assists with transportation and errands. He was able to perform bed mobility with supervision. Sit <> Stand with SPC and CGA twice. He ambulated ~42ft with SPC, CGA. Did report some dizziness, standing rest break initiated by PT. Pt also ambulated to bathroom after a seated rest break and did have 1-2 instances of needing minA/BUE support to steady due to dizziness. Pt requested to return to bed, but agreeable to be up in chair for lunch/dinner.  Overall the patient demonstrated deficits (see "PT Problem List") that impede the patient's functional abilities, safety, and mobility and would benefit from skilled PT intervention.   Pt on room air (had doffed spO2), ranging 88-91%. 2L donned for mobility, 90-93%, and left on 2L, RN notified.         If plan is discharge home, recommend the following: A little help with bathing/dressing/bathroom;Assistance with cooking/housework;Assist for transportation;Help with stairs or ramp for entrance   Can travel by private vehicle        Equipment Recommendations None recommended by PT  Recommendations for Other Services       Functional Status Assessment Patient has had a recent decline in their functional status and demonstrates the ability to make significant improvements in function in a reasonable and predictable amount of time.     Precautions / Restrictions Precautions Precautions: Fall Restrictions Weight Bearing Restrictions Per Provider Order: No      Mobility  Bed Mobility Overal bed  mobility: Needs Assistance Bed Mobility: Supine to Sit, Sit to Supine     Supine to sit: Supervision, HOB elevated, Used rails Sit to supine: Supervision, HOB elevated, Used rails        Transfers Overall transfer level: Needs assistance Equipment used: Straight cane Transfers: Sit to/from Stand Sit to Stand: Contact guard assist                Ambulation/Gait Ambulation/Gait assistance: Contact guard assist, Min assist Gait Distance (Feet): 80 Feet (45ft, and then 40ft after seated rest break to commode and back) Assistive device: Straight cane         General Gait Details: second bout of ambulation pt did need 1-2 instances of minA due to pt reported symptoms of dizziness. SpO2 on 2L WFLs  Stairs            Wheelchair Mobility     Tilt Bed    Modified Rankin (Stroke Patients Only)       Balance Overall balance assessment: Needs assistance Sitting-balance support: Feet supported Sitting balance-Leahy Scale: Good     Standing balance support: Single extremity supported, Bilateral upper extremity supported, During functional activity Standing balance-Leahy Scale: Fair Standing balance comment: a few instances of reaching for BUE due to dizziness                             Pertinent Vitals/Pain Pain Assessment Pain Assessment: No/denies pain    Home Living Family/patient expects to be discharged to:: Private residence Living Arrangements: Alone Available Help at Discharge: Friend(s)  Type of Home: Apartment Home Access: Level entry     Alternate Level Stairs-Number of Steps: 12 Home Layout: Two level;Able to live on main level with bedroom/bathroom Home Equipment: Jeananne Mighty - single point;Wheelchair - power      Prior Function Prior Level of Function : Independent/Modified Independent             Mobility Comments: ambulatory with SPC in home, power chair for out of home. friends assists with transportation ADLs Comments:  modI     Extremity/Trunk Assessment   Upper Extremity Assessment Upper Extremity Assessment: Overall WFL for tasks assessed    Lower Extremity Assessment Lower Extremity Assessment: Overall WFL for tasks assessed    Cervical / Trunk Assessment Cervical / Trunk Assessment: Normal  Communication        Cognition Arousal: Alert Behavior During Therapy: WFL for tasks assessed/performed   PT - Cognitive impairments: No apparent impairments                         Following commands: Intact       Cueing       General Comments      Exercises     Assessment/Plan    PT Assessment Patient needs continued PT services  PT Problem List Decreased strength;Decreased activity tolerance;Decreased balance;Decreased mobility       PT Treatment Interventions DME instruction;Balance training;Neuromuscular re-education;Gait training;Stair training;Functional mobility training;Therapeutic activities;Patient/family education;Therapeutic exercise    PT Goals (Current goals can be found in the Care Plan section)  Acute Rehab PT Goals Patient Stated Goal: to breathe better PT Goal Formulation: With patient Time For Goal Achievement: 01/29/24 Potential to Achieve Goals: Good    Frequency Min 2X/week     Co-evaluation               AM-PAC PT "6 Clicks" Mobility  Outcome Measure Help needed turning from your back to your side while in a flat bed without using bedrails?: None Help needed moving from lying on your back to sitting on the side of a flat bed without using bedrails?: None Help needed moving to and from a bed to a chair (including a wheelchair)?: None Help needed standing up from a chair using your arms (e.g., wheelchair or bedside chair)?: A Little Help needed to walk in hospital room?: A Little Help needed climbing 3-5 steps with a railing? : A Little 6 Click Score: 21    End of Session Equipment Utilized During Treatment: Gait belt Activity  Tolerance: Patient tolerated treatment well Patient left: with call bell/phone within reach;with bed alarm set;in bed Nurse Communication: Mobility status PT Visit Diagnosis: Other abnormalities of gait and mobility (R26.89);Difficulty in walking, not elsewhere classified (R26.2);Muscle weakness (generalized) (M62.81)    Time: 1610-9604 PT Time Calculation (min) (ACUTE ONLY): 19 min   Charges:   PT Evaluation $PT Eval Low Complexity: 1 Low PT Treatments $Therapeutic Activity: 8-22 mins PT General Charges $$ ACUTE PT VISIT: 1 Visit         Darien Eden PT, DPT 12:46 PM,01/15/24

## 2024-01-15 NOTE — Progress Notes (Signed)
 Per Lab, sputum sample sent earlier today is not a good sample. Will collect again. Lab recommends collecting early morning.

## 2024-01-15 NOTE — Progress Notes (Signed)
  Echocardiogram 2D Echocardiogram has been performed.  Noah Velasquez 01/15/2024, 10:00 AM

## 2024-01-15 NOTE — Evaluation (Signed)
 Occupational Therapy Evaluation Patient Details Name: Noah Velasquez MRN: [id number] DOB: 04/16/1966 Today's Date: 01/15/2024   History of Present Illness   Pt is a 58 yo male that presented to ED for SOB, workup for acute on chronic CHF, acute COPD exacerbation. PMH of COPD, HTN, DM, chronic pancreatitis, etoh, PTSD.     Clinical Impressions Pt seen for OT evaluation this date.  Pt currently on 2L O2 nasal cannula, short of breath at times with activity.  Pt presents with muscle weakness, decreased activity tolerance, pain and decreased ability to perform functional mobility and ADL tasks. He lives alone in an apartment which is 2 levels with his bedroom on 2nd level, he reports he is able to sleep on the couch downstairs if needed.  He has a cane, shower chair and power wheelchair at home.  He has family who helps with cooking and cleaning and takes him to the grocery store. Prior to admission he was able to perform light meal prep such as sandwiches. He would benefit from skilled OT services to maximize his safety and independence to return home to his prior level of care.       If plan is discharge home, recommend the following:   A little help with walking and/or transfers;A little help with bathing/dressing/bathroom;Assistance with cooking/housework;Assist for transportation     Functional Status Assessment   Patient has had a recent decline in their functional status and demonstrates the ability to make significant improvements in function in a reasonable and predictable amount of time.     Equipment Recommendations         Recommendations for Other Services         Precautions/Restrictions   Precautions Precautions: Fall Restrictions Weight Bearing Restrictions Per Provider Order: No     Mobility Bed Mobility Overal bed mobility: Needs Assistance Bed Mobility: Supine to Sit, Sit to Supine     Supine to sit: Supervision, HOB elevated, Used rails Sit to  supine: Supervision, HOB elevated, Used rails        Transfers Overall transfer level: Needs assistance Equipment used: Straight cane Transfers: Sit to/from Stand Sit to Stand: Contact guard assist                  Balance Overall balance assessment: Needs assistance Sitting-balance support: Feet supported Sitting balance-Leahy Scale: Good       Standing balance-Leahy Scale: Fair Standing balance comment: reports some dizziness today with standing.                           ADL either performed or assessed with clinical judgement   ADL Overall ADL's : Needs assistance/impaired Eating/Feeding: Modified independent   Grooming: Modified independent   Upper Body Bathing: Modified independent   Lower Body Bathing: Minimal assistance Lower Body Bathing Details (indicate cue type and reason): increased hip pain on right Upper Body Dressing : Modified independent   Lower Body Dressing: Minimal assistance   Toilet Transfer: Contact guard assist   Toileting- Clothing Manipulation and Hygiene: Contact guard assist       Functional mobility during ADLs: Contact guard assist General ADL Comments: assist with right sock this date, short of breath with activity.  Would benefit from Adaptive equipment, energy conservation training.     Vision Baseline Vision/History: 1 Wears glasses       Perception         Praxis         Pertinent  Vitals/Pain Pain Assessment Pain Assessment: 0-10 Pain Score: 9  Pain Location: left side, reports he occasionally has pain in this area after having pancreatitis several years ago. Pain Descriptors / Indicators: Aching Pain Intervention(s): Limited activity within patient's tolerance, Monitored during session, Repositioned     Extremity/Trunk Assessment Upper Extremity Assessment Upper Extremity Assessment: Generalized weakness   Lower Extremity Assessment Lower Extremity Assessment: Defer to PT evaluation    Cervical / Trunk Assessment Cervical / Trunk Assessment: Normal   Communication     Cognition Arousal: Alert Behavior During Therapy: WFL for tasks assessed/performed                                 Following commands: Intact       Cueing  General Comments          Exercises     Shoulder Instructions      Home Living Family/patient expects to be discharged to:: Private residence Living Arrangements: Alone Available Help at Discharge: Friend(s) Type of Home: Apartment Home Access: Level entry     Home Layout: Two level;Able to live on main level with bedroom/bathroom Alternate Level Stairs-Number of Steps: 12 Alternate Level Stairs-Rails: Can reach both Bathroom Shower/Tub: Tub/shower unit   Bathroom Toilet: Handicapped height     Home Equipment: Cane - single point;Wheelchair - power;Shower seat          Prior Functioning/Environment Prior Level of Function : Independent/Modified Independent             Mobility Comments: ambulatory with SPC in home, power chair for out of home. friends assists with transportation ADLs Comments: Pt reports he was modified independent with self care tasks but has been having increased difficulty for a while, had a nurse who came 5 times a week to assist but no longer has.  He has a history of right hip issues and increased pain.  He reports he has a cousin who will come and take him grocery shopping and will also grill some food for him.    OT Problem List: Decreased strength;Decreased range of motion;Decreased activity tolerance;Impaired balance (sitting and/or standing);Pain   OT Treatment/Interventions: Self-care/ADL training;Therapeutic exercise;Patient/family education;Balance training;Energy conservation;Therapeutic activities;DME and/or AE instruction      OT Goals(Current goals can be found in the care plan section)   Acute Rehab OT Goals Patient Stated Goal: pt would like to return home and be  as independent as possible. OT Goal Formulation: With patient Time For Goal Achievement: 01/29/24 Potential to Achieve Goals: Good ADL Goals Pt Will Perform Lower Body Dressing: with modified independence Pt Will Transfer to Toilet: with modified independence   OT Frequency:  Min 2X/week    Co-evaluation              AM-PAC OT "6 Clicks" Daily Activity     Outcome Measure   Help from another person taking care of personal grooming?: None Help from another person toileting, which includes using toliet, bedpan, or urinal?: A Little Help from another person bathing (including washing, rinsing, drying)?: A Little Help from another person to put on and taking off regular upper body clothing?: None Help from another person to put on and taking off regular lower body clothing?: A Little 6 Click Score: 17   End of Session Equipment Utilized During Treatment: Gait belt  Activity Tolerance: Patient limited by fatigue Patient left: in bed;with call bell/phone within reach;with bed alarm set  OT Visit Diagnosis: Unsteadiness on feet (R26.81);Repeated falls (R29.6);Muscle weakness (generalized) (M62.81);History of falling (Z91.81);Pain Pain - Right/Left: Left Pain - part of body:  (left side of trunk., right hip)                Time: 1340-1400 OT Time Calculation (min): 20 min Charges:  OT General Charges $OT Visit: 1 Visit OT Evaluation $OT Eval Low Complexity: 1 Low Javius Sylla T Lylliana Kitamura, OTR/L, CLT Daishon Chui 01/15/2024, 3:19 PM

## 2024-01-15 NOTE — H&P (Signed)
 History and Physical    Noah Velasquez WUJ:[id number] DOB: [birth year] DOA: 01/14/2024  PCP: Carollynn Cirri, NP (Confirm with patient/family/NH records and if not entered, this has to be entered at [facility] point of entry) Patient coming from: Home  I have personally briefly reviewed patient's old medical records in [facility]  Chief Complaint: Cough, wheezing, SOB  HPI: Noah Velasquez is a 58 y.o. male with medical history significant of COPD, HTN, IIDM, HLD, chronic pancreatitis, alcohol abuse, wheezing shortness of breath.  Symptoms started 2 days ago, Thursday patient started to have productive cough wheezing and shortness of breath and progressively getting worse.  Also admitted worsening of shortness of breath at night and could not lie flat last night and decided to come to the ED.  He also has had some sharp pain associated with his cough below bilateral rib cage.  Denies any fever chills.  ED Course: Afebrile, not tachycardia blood pressure SBP 150-160 oxygen saturation 97% on 2 L.  Chest x-ray showed pulmonary congestion blood work showed WBC 7.9 hemoglobin 14 BUN 11 creatinine 0.9 glucose 177.  Patient was given IV Solu-Medrol  DuoNebs  Review of Systems: As per HPI otherwise 14 point review of systems negative.    Past Medical History:  Diagnosis Date   Chronic hip pain    Chronic pancreatitis (HCC)    CKD (chronic kidney disease) stage 3, GFR 30-59 ml/min (HCC)    GERD (gastroesophageal reflux disease)    Hyperlipidemia    Hypertension    Osteoarthritis    Pancreatitis    Pancreatitis    PTSD (post-traumatic stress disorder)    Viral infection of left eye     Past Surgical History:  Procedure Laterality Date   pancreatic stent placement       reports that he has been smoking cigarettes. He has a 3.8 pack-year smoking history. He has never used smokeless tobacco. He reports current alcohol use of about 7.0 standard drinks of alcohol per week. He reports current  drug use. Drug: Marijuana.  Allergies  Allergen Reactions   Hctz [Hydrochlorothiazide] Swelling    Family History  Problem Relation Age of Onset   Diabetes Mother    Sleep apnea Mother    Emphysema Sister    CAD Neg Hx    Mental illness Neg Hx      Prior to Admission medications   Medication Sig Start Date End Date Taking? Authorizing Provider  amLODipine -olmesartan  (AZOR ) 10-40 MG tablet Take 1 tablet by mouth daily. 12/07/23  Yes Carollynn Cirri, NP  aspirin  EC 81 MG tablet Take 1 tablet (81 mg total) by mouth daily. Swallow whole. 08/01/22  Yes Pokhrel, Laxman, MD  budesonide -formoterol  (SYMBICORT ) 160-4.5 MCG/ACT inhaler Inhale 2 puffs into the lungs 2 (two) times daily. 08/01/22  Yes Pokhrel, Laxman, MD  lovastatin  (MEVACOR ) 10 MG tablet Take 1 tablet (10 mg total) by mouth at bedtime. 12/07/23  Yes Carollynn Cirri, NP  metFORMIN  (GLUCOPHAGE ) 500 MG tablet Take 1 tablet (500 mg total) by mouth daily with breakfast. 12/07/23  Yes Baity, Rankin Buzzard, NP  Nebulizers (COMPRESSOR/NEBULIZER) MISC Nebulizer and tubes/mask to use as directed with DuoNeb as needed 06/03/22  Yes Alexander, Natalie, DO  omeprazole  (PRILOSEC) 20 MG capsule Take 1 capsule (20 mg total) by mouth daily. 12/07/23  Yes Baity, Rankin Buzzard, NP  Pancrelipase , Lip-Prot-Amyl, (ZENPEP ) 15000-47000 units CPEP Take 1 capsule (15,000 Units total) by mouth 3 (three) times daily. 12/07/23  Yes Carollynn Cirri, NP  sertraline  (  ZOLOFT ) 100 MG tablet TAKE 1 TABLET BY MOUTH ONCE DAILY 10/12/23  Yes Carollynn Cirri, NP  VENTOLIN  HFA 108 (90 Base) MCG/ACT inhaler Inhale 1-2 puffs into the lungs every 6 (six) hours as needed. 12/29/23  Yes [provider]  nicotine  (NICODERM CQ  - DOSED IN MG/24 HOURS) 21 mg/24hr patch Place 1 patch (21 mg total) onto the skin daily. 06/04/22   Alexander, Natalie, DO  traZODone  (DESYREL ) 150 MG tablet Take 1 tablet (150 mg total) by mouth at bedtime as needed for sleep. 02/02/23   Carollynn Cirri, NP     Physical Exam: Vitals:   01/15/24 0518 01/15/24 0545 01/15/24 0745 01/15/24 0751  BP: (!) 159/95 (!) 163/98 (!) 150/100   Pulse: 88 86 84   Resp: 18 20 20    Temp: 97.7 F (36.5 C) 97.9 F (36.6 C) 98.1 F (36.7 C)   TempSrc: Oral Oral    SpO2: 93% 96% 97% 97%  Weight:      Height:        Constitutional: NAD, calm, comfortable Vitals:   01/15/24 0518 01/15/24 0545 01/15/24 0745 01/15/24 0751  BP: (!) 159/95 (!) 163/98 (!) 150/100   Pulse: 88 86 84   Resp: 18 20 20    Temp: 97.7 F (36.5 C) 97.9 F (36.6 C) 98.1 F (36.7 C)   TempSrc: Oral Oral    SpO2: 93% 96% 97% 97%  Weight:      Height:       Eyes: PERRL, lids and conjunctivae normal ENMT: Mucous membranes are moist. Posterior pharynx clear of any exudate or lesions.Normal dentition.  Neck: normal, supple, no masses, no thyromegaly Respiratory: clear to auscultation bilaterally, scattered wheezing, fine crackles to bilateral mid levels, increasing respiratory effort. No accessory muscle use.  Cardiovascular: Regular rate and rhythm, no murmurs / rubs / gallops. No extremity edema. 2+ pedal pulses. No carotid bruits.  Abdomen: no tenderness, no masses palpated. No hepatosplenomegaly. Bowel sounds positive.  Musculoskeletal: no clubbing / cyanosis. No joint deformity upper and lower extremities. Good ROM, no contractures. Normal muscle tone.  Skin: no rashes, lesions, ulcers. No induration Neurologic: CN 2-12 grossly intact. Sensation intact, DTR normal. Strength 5/5 in all 4.  Psychiatric: Normal judgment and insight. Alert and oriented x 3. Normal mood.    Labs on Admission: I have personally reviewed following labs and imaging studies  CBC: Recent Labs  Lab 01/15/24 0013  WBC 7.9  NEUTROABS 4.5  HGB 14.2  HCT 41.4  MCV 91.8  PLT 271   Basic Metabolic Panel: Recent Labs  Lab 01/15/24 0211  NA 131*  K 3.9  CL 101  CO2 21*  GLUCOSE 177*  BUN 11  CREATININE 0.97  CALCIUM  8.7*   GFR: Estimated  Creatinine Clearance: 77.6 mL/min (by C-G formula based on SCr of 0.97 mg/dL). Liver Function Tests: Recent Labs  Lab 01/15/24 0211  AST 25  ALT 15  ALKPHOS 62  BILITOT 0.4  PROT 8.7*  ALBUMIN 3.1*   No results for input(s): "LIPASE", "AMYLASE" in the last 168 hours. No results for input(s): "AMMONIA" in the last 168 hours. Coagulation Profile: No results for input(s): "INR", "PROTIME" in the last 168 hours. Cardiac Enzymes: No results for input(s): "CKTOTAL", "CKMB", "CKMBINDEX", "TROPONINI" in the last 168 hours. BNP (last 3 results) No results for input(s): "PROBNP" in the last 8760 hours. HbA1C: No results for input(s): "HGBA1C" in the last 72 hours. CBG: No results for input(s): "GLUCAP" in the last 168 hours.  Lipid Profile: No results for input(s): "CHOL", "HDL", "LDLCALC", "TRIG", "CHOLHDL", "LDLDIRECT" in the last 72 hours. Thyroid  Function Tests: No results for input(s): "TSH", "T4TOTAL", "FREET4", "T3FREE", "THYROIDAB" in the last 72 hours. Anemia Panel: No results for input(s): "VITAMINB12", "FOLATE", "FERRITIN", "TIBC", "IRON", "RETICCTPCT" in the last 72 hours. Urine analysis:    Component Value Date/Time   COLORURINE STRAW (A) 12/08/2021 1929   APPEARANCEUR CLEAR (A) 12/08/2021 1929   LABSPEC 1.008 12/08/2021 1929   PHURINE 7.0 12/08/2021 1929   GLUCOSEU NEGATIVE 12/08/2021 1929   HGBUR NEGATIVE 12/08/2021 1929   BILIRUBINUR NEGATIVE 12/08/2021 1929   BILIRUBINUR Negative 10/20/2018 1443   KETONESUR 5 (A) 12/08/2021 1929   PROTEINUR NEGATIVE 12/08/2021 1929   UROBILINOGEN 0.2 10/20/2018 1443   NITRITE NEGATIVE 12/08/2021 1929   LEUKOCYTESUR NEGATIVE 12/08/2021 1929    Radiological Exams on Admission: DG Chest Port 1 View Result Date: 01/15/2024 CLINICAL DATA:  Shortness of breath. History of COPD with inhaler. Decreased oxygen saturation. EXAM: PORTABLE CHEST 1 VIEW COMPARISON:  07/31/2022 FINDINGS: Borderline heart size with mild pulmonary vascular  congestion. Diffuse interstitial pattern to the lungs likely representing interstitial edema. Peribronchial thickening consistent with chronic bronchitis. No focal consolidation. No pleural effusion or pneumothorax. Mediastinal contours appear intact. IMPRESSION: Pulmonary vascular congestion with diffuse interstitial pattern to the lungs likely edema. Electronically Signed   By: Boyce Byes M.D.   On: 01/15/2024 00:15    EKG: Independently reviewed.  Sinus rhythm, no acute ST changes, chronic RBBB  Assessment/Plan Principal Problem:   COPD exacerbation (HCC) Active Problems:   Acute on chronic diastolic CHF (congestive heart failure) (HCC)   CHF (congestive heart failure) (HCC)  (please populate well all problems here in Problem List. (For example, if patient is on BP meds at home and you resume or decide to hold them, it is a problem that needs to be her. Same for CAD, COPD, HLD and so on)  Acute COPD exacerbation - Continue p.o. prednisone  - Continue ICS and LABA - DuoNebs and as needed albuterol  - Short course azithromycin  as there is also signs of changes of sputum production and color indicating infection - Other DDx, clinically and image study also indicating CHF, will be managed as below.  Acute, probably on chronic CHF decompensation, probably diastolic - Patient has signs of fluid overload with bilateral crackles and x-ray showed signs of pulmonary congestion - Slightly elevated proBNP - Will check echocardiogram - IV Lasix x2 and recheck chest x-ray tomorrow. -Check TSH and UA, UDS  HTN - Continue amlodipine  and ARB  IIDM - Continue metformin  - SSI  Cigarette smoking - Cessation education performed at bedside - Continue nicotine  patch  DVT prophylaxis: Lovenox  Code Status: Full code Family Communication: None at bedside Disposition Plan: Patient is sick with signs of both new onset of CHF with decompensation, as well as COPD exacerbation, expect more than 2  midnight hospital Consults called: None Admission status: Telemetry admission   [place] MD [facility] Pager [phone]  01/15/2024, 8:07 AM

## 2024-01-15 NOTE — Progress Notes (Signed)
 Initial Nutrition Assessment  DOCUMENTATION CODES:   Not applicable  INTERVENTION:   -MVI with minerals daily -Glucerna Shake po TID, each supplement provides 220 kcal and 10 grams of protein  -Liberalize diet to 2 gram sodium for wider variety of meal selections  NUTRITION DIAGNOSIS:   Increased nutrient needs related to chronic illness (COPD) as evidenced by estimated needs.  GOAL:   Patient will meet greater than or equal to 90% of their needs  MONITOR:   PO intake, Supplement acceptance  REASON FOR ASSESSMENT:   Consult Assessment of nutrition requirement/status  ASSESSMENT:   Pt with medical history significant of COPD, HTN, IIDM, HLD, chronic pancreatitis, alcohol abuse, wheezing shortness of breath.  Pt admitted with COPD and CHF exacerbation.   Reviewed I/O's: +50 ml x 24 hours  UOP: 1.1 L x 24 hours   Pt unavailable at time of visit. Attempted to speak with pt via call to hospital room phone, however, unable to reach. RD unable to obtain further nutrition-related history or complete nutrition-focused physical exam at this time.    Per H&P, pt complaining of worsening cough, wheezing, and shortness for 2 days PTA. He was unable to lie flat.   Pt currently on a heart healthy diet. No meal completion data available to assess at this time.   Noted pt with history of pancreatitis; takes pancrelipase  as an outpatient.   Reviewed wt hx; pt has experienced a 3.9% wt loss over the past 4 months, which is not significant for time frame.  Medications reviewed and include solu-medrol , protonix , zoloft , and thiamine .   Lab Results  Component Value Date   HGBA1C 6.5 (H) 12/07/2023   PTA DM medications are 500 mg metformin  daily.   Labs reviewed: Na: 131, CBGS: 224 (inpatient orders for glycemic control are 0-15 units insulin aspart TID with meals and 500 mg metformin  daily).  Suspect increased CBGS related to steroid use.   Diet Order:   Diet Order              Diet Heart Room service appropriate? Yes; Fluid consistency: Thin  Diet effective now                   EDUCATION NEEDS:   No education needs have been identified at this time  Skin:  Skin Assessment: Reviewed RN Assessment  Last BM:  01/15/24  Height:   Ht Readings from Last 1 Encounters:  01/15/24 5\' 7"  (1.702 m)    Weight:   Wt Readings from Last 1 Encounters:  01/15/24 78.9 kg    Ideal Body Weight:  67.2 kg  BMI:  Body mass index is 27.25 kg/m.  Estimated Nutritional Needs:   Kcal:  1800-2000  Protein:  90-105 grams  Fluid:  1.8-2.0 L    Herschel Lords, RD, LDN, CDCES Registered Dietitian III Certified Diabetes Care and Education Specialist If unable to reach this RD, please use "RD Inpatient" group chat on secure chat between hours of 8am-4 pm daily

## 2024-01-16 ENCOUNTER — Inpatient Hospital Stay

## 2024-01-16 DIAGNOSIS — R911 Solitary pulmonary nodule: Secondary | ICD-10-CM | POA: Diagnosis not present

## 2024-01-16 DIAGNOSIS — I5033 Acute on chronic diastolic (congestive) heart failure: Secondary | ICD-10-CM

## 2024-01-16 DIAGNOSIS — R0602 Shortness of breath: Secondary | ICD-10-CM | POA: Diagnosis not present

## 2024-01-16 DIAGNOSIS — R0989 Other specified symptoms and signs involving the circulatory and respiratory systems: Secondary | ICD-10-CM

## 2024-01-16 DIAGNOSIS — J449 Chronic obstructive pulmonary disease, unspecified: Secondary | ICD-10-CM | POA: Diagnosis not present

## 2024-01-16 DIAGNOSIS — I509 Heart failure, unspecified: Secondary | ICD-10-CM | POA: Diagnosis not present

## 2024-01-16 DIAGNOSIS — R0902 Hypoxemia: Secondary | ICD-10-CM | POA: Diagnosis not present

## 2024-01-16 DIAGNOSIS — J441 Chronic obstructive pulmonary disease with (acute) exacerbation: Secondary | ICD-10-CM | POA: Diagnosis not present

## 2024-01-16 LAB — GLUCOSE, CAPILLARY
Glucose-Capillary: 136 mg/dL — ABNORMAL HIGH (ref 70–99)
Glucose-Capillary: 134 mg/dL — ABNORMAL HIGH (ref 70–99)
Glucose-Capillary: 145 mg/dL — ABNORMAL HIGH (ref 70–99)
Glucose-Capillary: 181 mg/dL — ABNORMAL HIGH (ref 70–99)
Glucose-Capillary: 164 mg/dL — ABNORMAL HIGH (ref 70–99)

## 2024-01-16 LAB — LIPASE, BLOOD: Lipase: 23 U/L (ref 11–51)

## 2024-01-16 LAB — CBC
HCT: 41.2 % (ref 39.0–52.0)
Hemoglobin: 14.4 g/dL (ref 13.0–17.0)
MCH: 32.5 pg (ref 26.0–34.0)
MCHC: 35 g/dL (ref 30.0–36.0)
MCV: 93 fL (ref 80.0–100.0)
Platelets: 267 10*3/uL (ref 150–400)
RBC: 4.43 MIL/uL (ref 4.22–5.81)
RDW: 13.3 % (ref 11.5–15.5)
WBC: 10.7 10*3/uL — ABNORMAL HIGH (ref 4.0–10.5)
nRBC: 0 % (ref 0.0–0.2)

## 2024-01-16 LAB — BASIC METABOLIC PANEL WITH GFR
Anion gap: 10 (ref 5–15)
BUN: 20 mg/dL (ref 6–20)
CO2: 21 mmol/L — ABNORMAL LOW (ref 22–32)
Calcium: 8.7 mg/dL — ABNORMAL LOW (ref 8.9–10.3)
Chloride: 99 mmol/L (ref 98–111)
Creatinine, Ser: 0.94 mg/dL (ref 0.61–1.24)
GFR, Estimated: >60 mL/min (ref >60)
Glucose, Bld: 176 mg/dL — ABNORMAL HIGH (ref 70–99)
Potassium: 3.8 mmol/L (ref 3.5–5.1)
Sodium: 130 mmol/L — ABNORMAL LOW (ref 135–145)

## 2024-01-16 MED ORDER — POLYVINYL ALCOHOL 1.4 % OP SOLN
1.0000 [drp] | OPHTHALMIC | Status: DC | PRN
  Filled 2024-01-16: qty 15

## 2024-01-16 MED ORDER — AMOXICILLIN-POT CLAVULANATE 875-125 MG PO TABS: 1.0000 | ORAL_TABLET | Freq: Two times a day (BID) | ORAL | Status: DC

## 2024-01-16 MED ORDER — AMOXICILLIN-POT CLAVULANATE 875-125 MG PO TABS
1.0000 | ORAL_TABLET | Freq: Two times a day (BID) | ORAL | Status: DC
  Administered 2024-01-16 – 2024-01-18 (×4): 1 via ORAL
  Filled 2024-01-16 (×4): qty 1

## 2024-01-16 MED ORDER — GUAIFENESIN-DM 100-10 MG/5ML PO SYRP
5.0000 mL | ORAL_SOLUTION | ORAL | Status: DC | PRN
  Administered 2024-01-16: 5 mL via ORAL
  Filled 2024-01-16: qty 10

## 2024-01-16 MED ORDER — IPRATROPIUM-ALBUTEROL 0.5-2.5 (3) MG/3ML IN SOLN
3.0000 mL | Freq: Two times a day (BID) | RESPIRATORY_TRACT | Status: DC
  Administered 2024-01-16 – 2024-01-17 (×2): 3 mL via RESPIRATORY_TRACT
  Filled 2024-01-16 (×2): qty 3

## 2024-01-16 MED ORDER — FUROSEMIDE 20 MG PO TABS
20.0000 mg | ORAL_TABLET | Freq: Every day | ORAL | Status: DC
  Administered 2024-01-16 – 2024-01-18 (×3): 20 mg via ORAL
  Filled 2024-01-16 (×3): qty 1

## 2024-01-16 NOTE — Progress Notes (Signed)
SATURATION QUALIFICATIONS: (This note is used to comply with regulatory documentation for home oxygen)  Patient Saturations on Room Air at Rest = 89%  Patient Saturations on Room Air while Ambulating = 85%  Patient Saturations on 2 Liters of oxygen while Ambulating =91 %  Please briefly explain why patient needs home oxygen: 

## 2024-01-16 NOTE — Progress Notes (Addendum)
 [facility]  - [place] at [facility]   PATIENT NAME: Noah Velasquez    MR#:  [id number]  DATE OF BIRTH:  1966/02/04  SUBJECTIVE:  no family at bedside. Patient came in with Inc. using shortness of breath PND and orthopnea. Currently laying flat. Received Lasix yesterday. Breathing better. Does not use oxygen at home. Continues with smoking.    VITALS:  Blood pressure (!) 164/91, pulse 76, temperature 98.3 F (36.8 C), resp. rate 18, height 5\' 7"  (1.702 m), weight 75.5 kg, SpO2 93%.  PHYSICAL EXAMINATION:   GENERAL:  58 y.o.-year-old patient with no acute distress.  LUNGS: distant breath sounds bilaterally, no wheezing CARDIOVASCULAR: S1, S2 normal. No murmur   ABDOMEN: Soft, nontender, nondistended. Bowel sounds present.  EXTREMITIES: No  edema b/l.    NEUROLOGIC: nonfocal  patient is alert and awake   LABORATORY PANEL:  CBC Recent Labs  Lab 01/16/24 0702  WBC 10.7*  HGB 14.4  HCT 41.2  PLT 267    Chemistries  Recent Labs  Lab 01/15/24 0211 01/16/24 0702  NA 131* 130*  K 3.9 3.8  CL 101 99  CO2 21* 21*  GLUCOSE 177* 176*  BUN 11 20  CREATININE 0.97 0.94  CALCIUM  8.7* 8.7*  AST 25  --   ALT 15  --   ALKPHOS 62  --   BILITOT 0.4  --    Cardiac Enzymes No results for input(s): "TROPONINI" in the last 168 hours. RADIOLOGY:  DG Chest 1 View Result Date: 01/16/2024 CLINICAL DATA:  CHF. EXAM: CHEST  1 VIEW COMPARISON:  01/15/2024 FINDINGS: Cardiopericardial silhouette is at upper limits of normal for size. There is pulmonary vascular congestion without overt pulmonary edema. Nodular density projects over the lateral left mid lung, potentially obscured by the cardiac lead on the prior study. No acute bony abnormality. Telemetry leads overlie the chest. IMPRESSION: 1. Nodular density projects over the lateral left mid lung. CT chest without contrast recommended to further evaluate. 2. Pulmonary vascular congestion without overt airspace edema.  Electronically Signed   By: Donnal Fusi M.D.   On: 01/16/2024 09:14   ECHOCARDIOGRAM COMPLETE Result Date: 01/15/2024    ECHOCARDIOGRAM REPORT   Patient Name:   Noah Velasquez Date of Exam: 01/15/2024 Medical Rec #:  [id number]     Height:       67.0 in Accession #:    [id number]    Weight:       174.0 lb Date of Birth:  08/29/1965    BSA:          1.906 m Patient Age:    58 years      BP:           150/100 mmHg Patient Gender: M             HR:           95 bpm. Exam Location:  [facility] Procedure: 2D Echo, Cardiac Doppler and Color Doppler (Both Spectral and Color            Flow Doppler were utilized during procedure). Indications:     CHF I50.31  History:         Patient has no prior history of Echocardiogram examinations.  Sonographer:     Clenton Czech RDCS, FASE Referring Phys:  [id number] [place] Diagnosing Phys: Antonette Batters MD IMPRESSIONS  1. Left ventricular ejection fraction, by estimation, is 50 to 55%. The left ventricle has normal function. The left ventricle  has no regional wall motion abnormalities. There is mild left ventricular hypertrophy. Left ventricular diastolic parameters are consistent with Grade I diastolic dysfunction (impaired relaxation).  2. Right ventricular systolic function is normal. The right ventricular size is normal.  3. The mitral valve is normal in structure. Trivial mitral valve regurgitation.  4. The aortic valve is normal in structure. Aortic valve regurgitation is not visualized. Aortic valve sclerosis is present, with no evidence of aortic valve stenosis. FINDINGS  Left Ventricle: Left ventricular ejection fraction, by estimation, is 50 to 55%. The left ventricle has normal function. The left ventricle has no regional wall motion abnormalities. Strain was performed and the global longitudinal strain is indeterminate. Global longitudinal strain performed but not reported based on interpreter judgement due to suboptimal tracking. The left ventricular internal  cavity size was normal in size. There is mild left ventricular hypertrophy. Left ventricular diastolic parameters are consistent with Grade I diastolic dysfunction (impaired relaxation). Right Ventricle: The right ventricular size is normal. No increase in right ventricular wall thickness. Right ventricular systolic function is normal. Left Atrium: Left atrial size was normal in size. Right Atrium: Right atrial size was normal in size. Pericardium: There is no evidence of pericardial effusion. Mitral Valve: The mitral valve is normal in structure. Trivial mitral valve regurgitation. Tricuspid Valve: The tricuspid valve is normal in structure. Tricuspid valve regurgitation is mild. Aortic Valve: The aortic valve is normal in structure. Aortic valve regurgitation is not visualized. Aortic regurgitation PHT measures 750 msec. Aortic valve sclerosis is present, with no evidence of aortic valve stenosis. Aortic valve mean gradient measures 8.0 mmHg. Aortic valve peak gradient measures 14.7 mmHg. Aortic valve area, by VTI measures 2.40 cm. Pulmonic Valve: The pulmonic valve was normal in structure. Pulmonic valve regurgitation is not visualized. Aorta: The ascending aorta was not well visualized. IAS/Shunts: No atrial level shunt detected by color flow Doppler. Additional Comments: 3D was performed not requiring image post processing on an independent workstation and was indeterminate.  LEFT VENTRICLE PLAX 2D LVIDd:         4.90 cm     Diastology LVIDs:         3.50 cm     LV e' medial:    6.53 cm/s LV PW:         1.30 cm     LV E/e' medial:  13.2 LV IVS:        1.20 cm     LV e' lateral:   11.90 cm/s LVOT diam:     2.20 cm     LV E/e' lateral: 7.3 LV SV:         80 LV SV Index:   42 LVOT Area:     3.80 cm  LV Volumes (MOD) LV vol d, MOD A4C: 87.7 ml LV vol s, MOD A4C: 36.1 ml LV SV MOD A4C:     87.7 ml RIGHT VENTRICLE RV Basal diam:  3.20 cm RV S prime:     12.20 cm/s TAPSE (M-mode): 2.0 cm LEFT ATRIUM              Index        RIGHT ATRIUM           Index LA diam:        3.50 cm 1.84 cm/m   RA Area:     16.40 cm LA Vol (A2C):   63.5 ml 33.32 ml/m  RA Volume:   44.90 ml  23.56 ml/m LA Vol (A4C):  51.0 ml 26.76 ml/m LA Biplane Vol: 57.7 ml 30.27 ml/m  AORTIC VALVE                     PULMONIC VALVE AV Area (Vmax):    2.38 cm      PV Vmax:       1.25 m/s AV Area (Vmean):   2.30 cm      PV Peak grad:  6.2 mmHg AV Area (VTI):     2.40 cm AV Vmax:           191.50 cm/s AV Vmean:          128.000 cm/s AV VTI:            0.334 m AV Peak Grad:      14.7 mmHg AV Mean Grad:      8.0 mmHg LVOT Vmax:         120.00 cm/s LVOT Vmean:        77.400 cm/s LVOT VTI:          0.211 m LVOT/AV VTI ratio: 0.63 AI PHT:            750 msec  AORTA Ao Root diam: 3.80 cm MITRAL VALVE                TRICUSPID VALVE MV Area (PHT): 4.96 cm     TR Peak grad:   36.2 mmHg MV Decel Time: 153 msec     TR Vmax:        301.00 cm/s MV E velocity: 86.50 cm/s MV A velocity: 101.00 cm/s  SHUNTS MV E/A ratio:  0.86         Systemic VTI:  0.21 m                             Systemic Diam: 2.20 cm Antonette Batters MD Electronically signed by Antonette Batters MD Signature Date/Time: 01/15/2024/10:40:32 AM    Final    DG Chest Port 1 View Result Date: 01/15/2024 CLINICAL DATA:  Shortness of breath. History of COPD with inhaler. Decreased oxygen saturation. EXAM: PORTABLE CHEST 1 VIEW COMPARISON:  07/31/2022 FINDINGS: Borderline heart size with mild pulmonary vascular congestion. Diffuse interstitial pattern to the lungs likely representing interstitial edema. Peribronchial thickening consistent with chronic bronchitis. No focal consolidation. No pleural effusion or pneumothorax. Mediastinal contours appear intact. IMPRESSION: Pulmonary vascular congestion with diffuse interstitial pattern to the lungs likely edema. Electronically Signed   By: Boyce Byes M.D.   On: 01/15/2024 00:15    Assessment and Plan  Jourdon Zimmerle is a 58 y.o. male with  medical history significant of COPD, HTN, IIDM, HLD, chronic pancreatitis, alcohol abuse, wheezing shortness of breath. Symptoms started 2 days ago, Thursday patient started to have productive cough wheezing and shortness of breath and progressively getting worse.    Chest x-ray showed pulmonary congestion  left lung nodule. Rec CT chest  Acute hypoxic respiratory failure secondary to COPD exacerbation and CHF mild Diastolic--new recent bronchitis --came in with shortness of breath, orthopnea, PND --BNP 317 -- received one dose of Lasix in the ER. -- Will give Lasix 20 mg daily -- continue prednisone , empiric antibiotic -- inhalers, nebulizer --smoking cessation advised -- assess for home oxygen --Echo EF 50-55% with LVH -assess for home oxygen use  Abnormal x-ray with left lung nodule -- will get CT chest without contrast  Hypertension -- continue home meds  Type  II diabetes, hyperglycemia -- continue metformin   History of PTSD/depression -- continue Seroquel  Polysubstance abuse/smoking -- urine drug screen positive for cocaine-- advised to abstain  Procedures: Family communication :none Consults :none CODE STATUS: full DVT Prophylaxis :Enoxaparin  Level of care: Telemetry Medical Status is: Inpatient Remains inpatient appropriate because: chf/copd    TOTAL TIME TAKING CARE OF THIS PATIENT: 40 minutes.  >50% time spent on counselling and coordination of care  Note: This dictation was prepared with Dragon dictation along with smaller phrase technology. Any transcriptional errors that result from this process are unintentional.  Melvinia Stager M.D    [facility]   CC: Primary care physician; Carollynn Cirri, NP

## 2024-01-16 NOTE — Plan of Care (Signed)

## 2024-01-16 NOTE — TOC Initial Note (Signed)
 Transition of Care [facility]) - Initial/Assessment Note    Patient Details  Name: Noah Velasquez MRN: [id number] Date of Birth: Jul 09, 1966  [facility]) CM/SW Contact:    Loman Risk, RN Phone Number: 01/16/2024, 3:54 PM  Clinical Narrative:                  Admitted for: Heart Failure Admitted from: Home alone PCP: Baity  Current home health/prior home health/DME: cane, power WC   Patient anticipated to dc tomorrow.  Orders in for home O2. Referral made to [facility] for delivery  Therapy recommending Home health.  Patient in agreement.  I let patient know that due to his insurance I may not be able to secure a [facility] agency.  Patient states that he does not have a preference.    - [facility], [facility], and [facility] unable to accept - Message sent to [facility], [facility], [facility], and [facility],  awaiting response      Patient Goals and CMS Choice            Expected Discharge Plan and Services                                              Prior Living Arrangements/Services                       Activities of Daily Living   ADL Screening (condition at time of admission) Independently performs ADLs?: Yes (appropriate for developmental age) Is the patient deaf or have difficulty hearing?: No Does the patient have difficulty seeing, even when wearing glasses/contacts?: No Does the patient have difficulty concentrating, remembering, or making decisions?: No  Permission Sought/Granted                  Emotional Assessment              Admission diagnosis:  Hypoxia [R09.02] COPD exacerbation (HCC) [J44.1] Pulmonary vascular congestion [R09.89] CHF (congestive heart failure) (HCC) [I50.9] Patient Active Problem List   Diagnosis Date Noted   Pulmonary vascular congestion 01/16/2024   Hypoxia 01/16/2024   COPD exacerbation (HCC) 01/15/2024   Acute on chronic diastolic CHF (congestive heart failure) (HCC)  01/15/2024   CHF (congestive heart failure) (HCC) 01/15/2024   Type 2 diabetes mellitus with diabetic mononeuropathy, without long-term current use of insulin (HCC) 09/07/2023   Overweight with body mass index (BMI) of 28 to 28.9 in adult 04/07/2023   ILD (interstitial lung disease) (HCC) 06/01/2022   Aortic atherosclerosis (HCC) 12/10/2021   Hyperlipidemia associated with type 2 diabetes mellitus (HCC) 01/26/2021   GERD (gastroesophageal reflux disease) 01/26/2021   Chronic pancreatitis (HCC) 01/26/2021   Erectile dysfunction 01/10/2020   Anxiety and depression 06/05/2019   Alcohol use disorder, moderate, in sustained remission (HCC) 04/06/2019   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 [place] [address] Phone: [phone] Fax: [phone]     Social Drivers of Health (SDOH) Social History: SDOH Screenings   Food Insecurity: Food Insecurity Present (01/15/2024)  Housing: Low Risk  (01/15/2024)  Transportation Needs: No Transportation Needs (01/15/2024)  Utilities: Not At Risk (01/15/2024)  Alcohol Screen: Low Risk  (04/07/2023)  Depression (PHQ2-9): Low Risk  (04/07/2023)  Tobacco Use: High Risk (01/15/2024)   SDOH Interventions:     Readmission Risk Interventions     No data to display

## 2024-01-17 ENCOUNTER — Other Ambulatory Visit (HOSPITAL_COMMUNITY): Payer: Self-pay

## 2024-01-17 ENCOUNTER — Telehealth (HOSPITAL_COMMUNITY): Payer: Self-pay | Admitting: Pharmacy Technician

## 2024-01-17 ENCOUNTER — Inpatient Hospital Stay

## 2024-01-17 DIAGNOSIS — C3492 Malignant neoplasm of unspecified part of left bronchus or lung: Secondary | ICD-10-CM

## 2024-01-17 DIAGNOSIS — F17210 Nicotine dependence, cigarettes, uncomplicated: Secondary | ICD-10-CM | POA: Diagnosis not present

## 2024-01-17 DIAGNOSIS — R918 Other nonspecific abnormal finding of lung field: Secondary | ICD-10-CM

## 2024-01-17 DIAGNOSIS — J441 Chronic obstructive pulmonary disease with (acute) exacerbation: Secondary | ICD-10-CM | POA: Diagnosis not present

## 2024-01-17 DIAGNOSIS — Z8673 Personal history of transient ischemic attack (TIA), and cerebral infarction without residual deficits: Secondary | ICD-10-CM | POA: Diagnosis not present

## 2024-01-17 LAB — BASIC METABOLIC PANEL WITH GFR
Anion gap: 11 (ref 5–15)
BUN: 20 mg/dL (ref 6–20)
CO2: 25 mmol/L (ref 22–32)
Calcium: 8.6 mg/dL — ABNORMAL LOW (ref 8.9–10.3)
Chloride: 97 mmol/L — ABNORMAL LOW (ref 98–111)
Creatinine, Ser: 1.05 mg/dL (ref 0.61–1.24)
GFR, Estimated: >60 mL/min (ref >60)
Glucose, Bld: 172 mg/dL — ABNORMAL HIGH (ref 70–99)
Potassium: 3.5 mmol/L (ref 3.5–5.1)
Sodium: 133 mmol/L — ABNORMAL LOW (ref 135–145)

## 2024-01-17 LAB — GLUCOSE, CAPILLARY
Glucose-Capillary: 201 mg/dL — ABNORMAL HIGH (ref 70–99)
Glucose-Capillary: 94 mg/dL (ref 70–99)
Glucose-Capillary: 248 mg/dL — ABNORMAL HIGH (ref 70–99)
Glucose-Capillary: 280 mg/dL — ABNORMAL HIGH (ref 70–99)

## 2024-01-17 MED ORDER — LORAZEPAM 1 MG PO TABS
1.0000 mg | ORAL_TABLET | Freq: Four times a day (QID) | ORAL | Status: DC | PRN
  Administered 2024-01-17: 1 mg via ORAL
  Filled 2024-01-17: qty 1

## 2024-01-17 MED ORDER — IPRATROPIUM-ALBUTEROL 0.5-2.5 (3) MG/3ML IN SOLN
3.0000 mL | RESPIRATORY_TRACT | Status: DC
  Administered 2024-01-17 (×3): 3 mL via RESPIRATORY_TRACT
  Filled 2024-01-17 (×4): qty 3

## 2024-01-17 MED ORDER — GADOBUTROL 1 MMOL/ML IV SOLN
7.0000 mL | Freq: Once | INTRAVENOUS | Status: AC | PRN
  Administered 2024-01-17: 7 mL via INTRAVENOUS

## 2024-01-17 MED ORDER — BUDESONIDE 0.5 MG/2ML IN SUSP
0.5000 mg | Freq: Two times a day (BID) | RESPIRATORY_TRACT | Status: DC
  Administered 2024-01-17 – 2024-01-18 (×3): 0.5 mg via RESPIRATORY_TRACT
  Filled 2024-01-17 (×3): qty 2

## 2024-01-17 NOTE — Progress Notes (Signed)
 Heart Failure Nurse Navigator Progress Note  PCP: Carollynn Cirri, NP PCP-Cardiologist: None Admission Diagnosis: COPD exacerbation (HCC) Hypoxia Pulmonary vascular congestion Admitted from: Home  Presentation:   Roxann Copper presented with wheezing  and shortness of breath.  BNP 317.  HS-Troponon was 26,23.  UDS + Cocaine on admission. Chest x-ray pulmonary vascular congestion without airspace edema. 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    ECHO/ LVEF: 50-55%, mild LVH, Grade I diastolic dysfunction  Clinical Course:  Past Medical History:  Diagnosis Date   Chronic hip pain    Chronic pancreatitis (HCC)    CKD (chronic kidney disease) stage 3, GFR 30-59 ml/min (HCC)    GERD (gastroesophageal reflux disease)    Hyperlipidemia    Hypertension    Osteoarthritis    Pancreatitis    Pancreatitis    PTSD (post-traumatic stress disorder)    Viral infection of left eye      Social History   Socioeconomic History   Marital status: Legally Separated    Spouse name: Not on file   Number of children: 2   Years of education: Not on file   Highest education level: High school graduate  Occupational History   Occupation: on disability  Tobacco Use   Smoking status: Every Day    Current packs/day: 0.25    Average packs/day: 0.3 packs/day for 15.0 years (3.8 ttl pk-yrs)    Types: Cigarettes   Smokeless tobacco: Never  Vaping Use   Vaping status: Some Days  Substance and Sexual Activity   Alcohol use: Yes    Alcohol/week: 7.0 standard drinks of alcohol    Types: 7 Cans of beer per week   Drug use: Yes    Types: Marijuana   Sexual activity: Yes    Birth control/protection: Condom  Other Topics Concern   Not on file  Social History Narrative   Not on file   Social Drivers of Health   Financial Resource Strain: Not on file  Food Insecurity: Food Insecurity Present (01/15/2024)   Hunger Vital Sign     Worried About Running Out of Food in the Last Year: Sometimes true    Ran Out of Food in the Last Year: Sometimes true  Transportation Needs: No Transportation Needs (01/15/2024)   PRAPARE - Administrator, Civil Service (Medical): No    Lack of Transportation (Non-Medical): No  Physical Activity: Not on file  Stress: Not on file  Social Connections: Not on file   Education Assessment and Provision:  Detailed education and instructions provided on heart failure disease management including the following:  Signs and symptoms of Heart Failure When to call the physician Importance of daily weights Low sodium diet Fluid restriction Medication management Anticipated future follow-up appointments  Patient education given on each of the above topics.  Patient acknowledges understanding via teach back method and acceptance of all instructions.  Education Materials:  "Living Better With Heart Failure" Booklet, HF zone tool, & Daily Weight Tracker Tool.  Patient has scale at home: Yes Patient has pill box at home: Yes    High Risk Criteria for Readmission and/or Poor Patient Outcomes: Heart failure hospital admissions (last 6 months): 0  No Show rate: 13% Difficult social situation: Drug use Demonstrates medication adherence: Yes Primary Language: English Literacy level: reading, Writing & Comprehension  Barriers of Care:   None  Considerations/Referrals:   Referral made to Heart Failure Pharmacist Stewardship: Yes Referral made to  Heart Failure CSW/NCM TOC: No Referral made to Heart & Vascular TOC clinic: New TOC 01/25/24 @ 12:00 PM  Items for Follow-up on DC/TOC: Diet & Fluid Restrictions Daily Weights Smoking & Drug Cessation Continued Heart Failure Education  Celedonio Coil, RN, BSN [facility] Heart Failure Navigator Secure Chat Only

## 2024-01-17 NOTE — Progress Notes (Signed)
 PROGRESS NOTE  Noah Velasquez    DOB: 1966-07-26, 58 y.o.  WGN:[id number]    Code Status: Full Code   DOA: 01/14/2024   LOS: 2   Brief hospital course  Arby Dahir is a 58 y.o. male with medical history significant of COPD, HTN, IIDM, HLD, chronic pancreatitis, alcohol abuse, wheezing shortness of breath.   Chest x-ray showed pulmonary congestion left lung nodule. Started on treatment for COPD exacerbation and CHF workup.  Follow up chest CT with concerning findings consistent with 3.7 x 1.8 cm lobulated mass in the left upper lobe abutting the chest wall, suspicious for primary bronchogenic carcinoma, with adjacent satellite nodularity. 2. 14 mm short axis right paratracheal node and a 14 mm short axis prevascular node, suspicious for thoracic nodal metastases.  01/17/24 -patient informed of CT findings/indication. Established care with heme/onc. Brain MRI to assess for mets. Pulmonology consulted to pursue bronchoscopy and Bx which will be done outpatient.   Assessment & Plan  Principal Problem:   COPD exacerbation (HCC) Active Problems:   Acute on chronic diastolic CHF (congestive heart failure) (HCC)   CHF (congestive heart failure) (HCC)   Pulmonary vascular congestion   Hypoxia  Acute hypoxic respiratory- multifactorial. Currently on 2L [place] and comfortable while resting. Echo EF 50-55% with LVH, G1dd - continue COPD treatments and gentle diuresis. 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  - heme/onc consulted to establish care  - brain MRI [facility] ordered for met evaluation   - outpatient PET scan planned after brinch - pulmonology consulted-   - planning bronchoscopy outpatient 6/9 - continue to attempt to wean patient to room air. If not able to, can dc home with 2L O2 tomorrow.     Hypertension -- continue home meds   Type II diabetes, hyperglycemia -- continue metformin    History of PTSD/depression -- continue Seroquel   Polysubstance abuse/smoking -- urine drug screen positive for cocaine-- advised to abstain  Body mass index is 26.47 kg/m.  VTE ppx: enoxaparin  (LOVENOX ) injection 40 mg Start: 01/15/24 1000  Diet:     Diet   Diet 2 gram sodium Fluid consistency: Thin   Consultants: Pulmonology Heme/onc  Subjective 01/17/24    Pt reports feeling no SOB at rest, no current chest pain. States that laying on his left side exacerbates a cough. Provided patient with CT chest scan results and what findings likely indicate as well as tentative plan going forward. He was understandably in a stage of shock at the news and asked for time to process.    Objective   Blood pressure (!) 152/99, pulse 71, temperature 97.8 F (36.6 C), temperature source Oral, resp. rate 16, height 5\' 7"  (1.702 m), weight 76.7 kg, SpO2 100%.  Intake/Output Summary (Last 24 hours) at 01/17/2024 0841 Last data filed at 01/16/2024 2200 Gross per 24 hour  Intake 480 ml  Output --  Net 480 ml   Filed Weights   01/15/24 0004 01/15/24 1304 01/17/24 0500  Weight: 78.9 kg 75.5 kg 76.7 kg    Physical Exam:  General: awake, alert, NAD HEENT: atraumatic, clear conjunctiva, anicteric sclera, MMM, hearing grossly normal Respiratory: normal respiratory effort. Cardiovascular: quick capillary refill, normal S1/S2, RRR, no JVD, murmurs Nervous: A&O x3. no gross focal neurologic deficits, normal speech Extremities: moves all equally, no edema, normal tone Skin: dry, intact, normal  temperature, normal color. No rashes, lesions or ulcers on exposed skin Psychiatry: normal mood under circumstance, congruent affect  Labs   I have personally reviewed the following labs and imaging studies CBC    Component Value Date/Time   WBC 10.7 (H) 01/16/2024 0702   RBC 4.43 01/16/2024 0702   HGB  14.4 01/16/2024 0702   HCT 41.2 01/16/2024 0702   PLT 267 01/16/2024 0702   MCV 93.0 01/16/2024 0702   MCH 32.5 01/16/2024 0702   MCHC 35.0 01/16/2024 0702   RDW 13.3 01/16/2024 0702   LYMPHSABS 2.4 01/15/2024 0013   MONOABS 0.5 01/15/2024 0013   EOSABS 0.4 01/15/2024 0013   BASOSABS 0.1 01/15/2024 0013      Latest Ref Rng & Units 01/16/2024    7:02 AM 01/15/2024    2:11 AM 12/07/2023    1:34 PM  BMP  Glucose 70 - 99 mg/dL 469  629  528   BUN 6 - 20 mg/dL 20  11  10    Creatinine 0.61 - 1.24 mg/dL 4.13  2.44  0.10   BUN/Creat Ratio 6 - 22 (calc)   SEE NOTE:   Sodium 135 - 145 mmol/L 130  131  135   Potassium 3.5 - 5.1 mmol/L 3.8  3.9  4.5   Chloride 98 - 111 mmol/L 99  101  101   CO2 22 - 32 mmol/L 21  21  25    Calcium  8.9 - 10.3 mg/dL 8.7  8.7  9.4     CT CHEST WO CONTRAST Result Date: 01/16/2024 EXAM: CT CHEST WITHOUT CONTRAST 01/16/2024 04:33:00 PM TECHNIQUE: CT of the chest was performed without the administration of intravenous contrast. Multiplanar reformatted images are provided for review. Automated exposure control, iterative reconstruction, and/or weight based adjustment of the mA/kV was utilized to reduce the radiation dose to as low as reasonably achievable. COMPARISON: Chest radiograph earlier today. CLINICAL HISTORY: Abnormal xray - lung nodule, >= 1 cm. FINDINGS: MEDIASTINUM: Heart and pericardium are unremarkable. The central airways are clear. LYMPH NODES: 14 mm short axis right paratracheal node (image 49) and a 14 mm short axis prevascular node (image 52), suspicious for thoracic nodal metastases. LUNGS AND PLEURA: 3.7 x 1.8 cm lobulated mass in the left upper lobe abutting the chest wall (image 52), suspicious for primary bronchogenic carcinoma. Adjacent satellite nodularity in the left upper lobe. Moderate centrilobular and paraseptal emphysematous changes, upper lung predominant. Lower lobe predominant subpleural reticulation/fibrosis, favoring superimposed chronic lung  disease. No pleural effusion or pneumothorax. SOFT TISSUES/BONES: Mild degenerative changes of the lower thoracic spine. No acute abnormality of the soft tissues. UPPER ABDOMEN: Diffuse parenchymal calcifications along the pancreatic body and tail, reflecting sequela of prior or chronic pancreatitis. IMPRESSION: 1. 3.7 x 1.8 cm lobulated mass in the left upper lobe abutting the chest wall, suspicious for primary bronchogenic carcinoma, with adjacent satellite nodularity. 2. 14 mm short axis right paratracheal node and a 14 mm short axis prevascular node, suspicious for thoracic nodal metastases. Electronically signed by: Zadie Herter MD 01/16/2024 07:54 PM EDT RP Workstation: [id number]   DG Chest 1 View Result Date: 01/16/2024 CLINICAL DATA:  CHF. EXAM: CHEST  1 VIEW COMPARISON:  01/15/2024 FINDINGS: Cardiopericardial silhouette is at upper limits of normal for size. There is pulmonary vascular congestion without overt pulmonary edema. Nodular density projects over the lateral left mid lung, potentially obscured by the cardiac lead on the prior study. No acute bony abnormality. Telemetry leads overlie the chest. IMPRESSION: 1. Nodular density projects  over the lateral left mid lung. CT chest without contrast recommended to further evaluate. 2. Pulmonary vascular congestion without overt airspace edema. Electronically Signed   By: Donnal Fusi M.D.   On: 01/16/2024 09:14   ECHOCARDIOGRAM COMPLETE Result Date: 01/15/2024    ECHOCARDIOGRAM REPORT   Patient Name:   AMARI ZAGAL Date of Exam: 01/15/2024 Medical Rec #:  [id number]     Height:       67.0 in Accession #:    [id number]    Weight:       174.0 lb Date of Birth:  [birth year]    BSA:          1.906 m Patient Age:    57 years      BP:           150/100 mmHg Patient Gender: M             HR:           95 bpm. Exam Location:  [facility] Procedure: 2D Echo, Cardiac Doppler and Color Doppler (Both Spectral and Color            Flow Doppler were utilized during  procedure). Indications:     CHF I50.31  History:         Patient has no prior history of Echocardiogram examinations.  Sonographer:     Clenton Czech RDCS, FASE Referring Phys:  [id number] [place] Diagnosing Phys: Antonette Batters MD IMPRESSIONS  1. Left ventricular ejection fraction, by estimation, is 50 to 55%. The left ventricle has normal function. The left ventricle has no regional wall motion abnormalities. There is mild left ventricular hypertrophy. Left ventricular diastolic parameters are consistent with Grade I diastolic dysfunction (impaired relaxation).  2. Right ventricular systolic function is normal. The right ventricular size is normal.  3. The mitral valve is normal in structure. Trivial mitral valve regurgitation.  4. The aortic valve is normal in structure. Aortic valve regurgitation is not visualized. Aortic valve sclerosis is present, with no evidence of aortic valve stenosis. FINDINGS  Left Ventricle: Left ventricular ejection fraction, by estimation, is 50 to 55%. The left ventricle has normal function. The left ventricle has no regional wall motion abnormalities. Strain was performed and the global longitudinal strain is indeterminate. Global longitudinal strain performed but not reported based on interpreter judgement due to suboptimal tracking. The left ventricular internal cavity size was normal in size. There is mild left ventricular hypertrophy. Left ventricular diastolic parameters are consistent with Grade I diastolic dysfunction (impaired relaxation). Right Ventricle: The right ventricular size is normal. No increase in right ventricular wall thickness. Right ventricular systolic function is normal. Left Atrium: Left atrial size was normal in size. Right Atrium: Right atrial size was normal in size. Pericardium: There is no evidence of pericardial effusion. Mitral Valve: The mitral valve is normal in structure. Trivial mitral valve regurgitation. Tricuspid Valve: The tricuspid  valve is normal in structure. Tricuspid valve regurgitation is mild. Aortic Valve: The aortic valve is normal in structure. Aortic valve regurgitation is not visualized. Aortic regurgitation PHT measures 750 msec. Aortic valve sclerosis is present, with no evidence of aortic valve stenosis. Aortic valve mean gradient measures 8.0 mmHg. Aortic valve peak gradient measures 14.7 mmHg. Aortic valve area, by VTI measures 2.40 cm. Pulmonic Valve: The pulmonic valve was normal in structure. Pulmonic valve regurgitation is not visualized. Aorta: The ascending aorta was not well visualized. IAS/Shunts: No atrial level shunt detected by color flow Doppler. Additional Comments:  3D was performed not requiring image post processing on an independent workstation and was indeterminate.  LEFT VENTRICLE PLAX 2D LVIDd:         4.90 cm     Diastology LVIDs:         3.50 cm     LV e' medial:    6.53 cm/s LV PW:         1.30 cm     LV E/e' medial:  13.2 LV IVS:        1.20 cm     LV e' lateral:   11.90 cm/s LVOT diam:     2.20 cm     LV E/e' lateral: 7.3 LV SV:         80 LV SV Index:   42 LVOT Area:     3.80 cm  LV Volumes (MOD) LV vol d, MOD A4C: 87.7 ml LV vol s, MOD A4C: 36.1 ml LV SV MOD A4C:     87.7 ml RIGHT VENTRICLE RV Basal diam:  3.20 cm RV S prime:     12.20 cm/s TAPSE (M-mode): 2.0 cm LEFT ATRIUM             Index        RIGHT ATRIUM           Index LA diam:        3.50 cm 1.84 cm/m   RA Area:     16.40 cm LA Vol (A2C):   63.5 ml 33.32 ml/m  RA Volume:   44.90 ml  23.56 ml/m LA Vol (A4C):   51.0 ml 26.76 ml/m LA Biplane Vol: 57.7 ml 30.27 ml/m  AORTIC VALVE                     PULMONIC VALVE AV Area (Vmax):    2.38 cm      PV Vmax:       1.25 m/s AV Area (Vmean):   2.30 cm      PV Peak grad:  6.2 mmHg AV Area (VTI):     2.40 cm AV Vmax:           191.50 cm/s AV Vmean:          128.000 cm/s AV VTI:            0.334 m AV Peak Grad:      14.7 mmHg AV Mean Grad:      8.0 mmHg LVOT Vmax:         120.00 cm/s LVOT  Vmean:        77.400 cm/s LVOT VTI:          0.211 m LVOT/AV VTI ratio: 0.63 AI PHT:            750 msec  AORTA Ao Root diam: 3.80 cm MITRAL VALVE                TRICUSPID VALVE MV Area (PHT): 4.96 cm     TR Peak grad:   36.2 mmHg MV Decel Time: 153 msec     TR Vmax:        301.00 cm/s MV E velocity: 86.50 cm/s MV A velocity: 101.00 cm/s  SHUNTS MV E/A ratio:  0.86         Systemic VTI:  0.21 m                             Systemic Diam:  2.20 cm Antonette Batters MD Electronically signed by Antonette Batters MD Signature Date/Time: 01/15/2024/10:40:32 AM    Final     Disposition Plan & Communication  Patient status: Inpatient  Admitted From: Home Planned disposition location: Home Anticipated discharge date: 6/3 pending wean from O2  Family Communication: patient asked that he can inform his family members himself    Author: Ree Candy, DO [facility] 01/17/2024, 8:41 AM   Available by Epic secure chat 7AM-7PM. If 7PM-7AM, please contact night-coverage.  [facility] contact information found on [email or web address].

## 2024-01-17 NOTE — Progress Notes (Signed)
 Heart Failure Stewardship Pharmacy Note  PCP: Carollynn Cirri, NP PCP-Cardiologist: None  HPI: Noah Velasquez is a 58 y.o. male with COPD, HTN, IIDM, HLD, chronic pancreatitis, alcohol abuse who presented with wheezing and shortness of breath. On admission, BNP was 317.8, HS-troponin was 26 > 23, UDS positive for cocaine, and TSH was 1.165. Chest x-ray noted pulmonary vascular congestion without airspace edema. 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 Echocardiogram this admission noted LVEFof 50-55%, mild LVH, grade I diastolic dysfunction.  Pertinent Lab Values: Creat  Date Value Ref Range Status  12/07/2023 1.03 0.70 - 1.30 mg/dL Final   Creatinine, Ser  Date Value Ref Range Status  01/16/2024 0.94 0.61 - 1.24 mg/dL Final   BUN  Date Value Ref Range Status  01/16/2024 20 6 - 20 mg/dL Final   Potassium  Date Value Ref Range Status  01/16/2024 3.8 3.5 - 5.1 mmol/L Final   Sodium  Date Value Ref Range Status  01/16/2024 130 (L) 135 - 145 mmol/L Final   B Natriuretic Peptide  Date Value Ref Range Status  01/15/2024 317.8 (H) 0.0 - 100.0 pg/mL Final    Comment:    Performed at [facility], [address], [place], [place] [address]   Magnesium   Date Value Ref Range Status  08/01/2022 2.2 1.7 - 2.4 mg/dL Final    Comment:    Performed at [facility], [address] [place], [place] [address]   Hgb A1c MFr Bld  Date Value Ref Range Status  12/07/2023 6.5 (H) <5.7 % Final    Comment:    For someone without known diabetes, a hemoglobin A1c value of 6.5% or greater indicates that they may have  diabetes and this should be confirmed with a follow-up  test. . For someone with known diabetes, a value <7% indicates  that their diabetes is well controlled and a value  greater than or equal to 7% indicates suboptimal  control. A1c targets should be individualized based on  duration of  diabetes, age, comorbid conditions, and  other considerations. . Currently, no consensus exists regarding use of hemoglobin A1c for diagnosis of diabetes for children. .    TSH  Date Value Ref Range Status  01/15/2024 1.165 0.350 - 4.500 uIU/mL Final    Comment:    Performed by a 3rd Generation assay with a functional sensitivity of <=0.01 uIU/mL. Performed at [facility], [address] [place], [place] [address]   01/10/2020 1.59 0.40 - 4.50 mIU/L Final    Vital Signs:  Temp:  [97.8 F (36.6 C)-98.5 F (36.9 C)] 97.9 F (36.6 C) (06/02 0918) Pulse Rate:  [71-85] 75 (06/02 0918) Resp:  [16-20] 18 (06/02 0918) BP: (135-161)/(87-99) 138/87 (06/02 0918) SpO2:  [93 %-100 %] 95 % (06/02 0918) Weight:  [76.7 kg (169 lb)] 76.7 kg (169 lb) (06/02 0500)  Intake/Output Summary (Last 24 hours) at 01/17/2024 1002 Last data filed at 01/16/2024 2200 Gross per 24 hour  Intake 360 ml  Output --  Net 360 ml   Current Heart Failure Medications:  Loop diuretic: furosemide 20 mg PO daily Beta-Blocker: none ACEI/ARB/ARNI: irbesartan 300 mg daily MRA: none SGLT2i: none Other: amlodipine  10 mg daily  Prior to admission Heart Failure Medications:  Loop diuretic: none Beta-Blocker: none ACEI/ARB/ARNI: olmesartan  40 mg daily MRA: none SGLT2i: none Other: amlodipine  10 mg daily  Assessment: 1. Acute on chronic diastolic heart failure (LVEF 50-55%) with grade I diastolic  dysfunction, due to NICM. NYHA class II-III symptoms.  -Symptoms: Patient reports feeling better, but not back to baseline. Reports mild orthopnea and dyspnea on exertion. Symptoms appear to be more due to chronic lung disease than volume overload. -Volume: Appears nearly euvolemic. Currently on furosemide 20 mg daily. Could consider adding spironolactone to augment diuresis.  -Hemodynamics: BP is elevated despite multiple antihypertensives. HR 70s. -SGLT2i:Consider adding Farxiga 10 mg daily as first line  therapy for HFpEF. -MRA: Consider adding spironolactone 25 mg daily for CHF, resistant HTN, diuresis, and to maintain K >4. -ARNI: Could consider adding outpatient if BP is not controlled on irbesartan, amlodipine , and spironolactone.  Plan: 1) Medication changes recommended at this time: -Consider adding spironolactone 25 mg daily  2) Patient assistance: -Copays for all medications are $4. Patient does not have the funds for medications at this time and is willing to send to [facility] pharmacy for copays to be either waived or added to a charge account.   3) Education: - Patient has been educated on current HF medications and potential additions to HF medication regimen - Patient verbalizes understanding that over the next few months, these medication doses may change and more medications may be added to optimize HF regimen - Patient has been educated on basic disease state pathophysiology and goals of therapy  Medication Assistance / Insurance Benefits Check: Does the patient have prescription insurance?    Type of insurance plan:  Does the patient qualify for medication assistance through manufacturers or grants? No  Outpatient Pharmacy: Prior to admission outpatient pharmacy: [facility]      Please do not hesitate to reach out with questions or concerns,  Bevely Brush, PharmD, CPP, BCPS Heart Failure Pharmacist  Phone - [phone] 01/17/2024 10:02 AM

## 2024-01-17 NOTE — Discharge Summary (Signed)
 Physician Discharge Summary  Patient: Noah Velasquez VWU:[id number] DOB: 1966-02-19   Code Status: Prior Admit date: 01/14/2024 Discharge date: 01/20/2024 Disposition: Home, No home health services recommended PCP: Carollynn Cirri, NP  Recommendations for Outpatient Follow-up:  Follow up with PCP within 1-2 weeks Regarding general hospital follow up and preventative care Recommend CBC Follow up with pulmonology for bronchoscopy and biopsy Follow up with oncology for PET and Bx results  Discharge Diagnoses:  Principal Problem:   COPD exacerbation (HCC) Active Problems:   Acute on chronic diastolic CHF (congestive heart failure) (HCC)   CHF (congestive heart failure) (HCC)   Pulmonary vascular congestion   Hypoxia   Bronchiolo-alveolar adenocarcinoma of left lung [facility])  Brief Hospital Course Summary: Noah Velasquez is a 58 y.o. male with medical history significant of COPD, HTN, IIDM, HLD, chronic pancreatitis, alcohol dependence, wheezing shortness of breath.   Chest x-ray showed pulmonary congestion left lung nodule. Started on treatment for COPD exacerbation and CHF workup. Started on 2Lnc and treated with diuresis and breathing treatments.  Follow up chest CT with concerning findings consistent with 3.7 x 1.8 cm lobulated mass in the left upper lobe abutting the chest wall, suspicious for primary bronchogenic carcinoma, with adjacent satellite nodularity. 2. 14 mm short axis right paratracheal node and a 14 mm short axis prevascular node, suspicious for thoracic nodal metastases.   01/17/24 -patient informed of CT findings/indication. Established care with heme/onc. Brain MRI to assess for mets was negative. Pulmonology consulted to pursue bronchoscopy and Bx which will be done outpatient. He will also have outpatient oncology follow up.   He was able to wean to 2L Noah Velasquez but unable to discontinue prior to dc so was evaluated and discharged home with oxygen.  All other chronic  conditions were treated with home medications.    Discharge Condition: Stable, improved Recommended discharge diet: regular healthy diet  Consultations: Oncology Pulmonology   Procedures/Studies: Echo- EF 50-55% with LVH, G1dd   Allergies as of 01/18/2024       Reactions   Hctz [hydrochlorothiazide] Swelling        Medication List     TAKE these medications    amLODipine -olmesartan  10-40 MG tablet Commonly known as: AZOR  Take 1 tablet by mouth daily.   amoxicillin -clavulanate 875-125 MG tablet Commonly known as: AUGMENTIN  Take 1 tablet by mouth every 12 (twelve) hours for 5 days.   aspirin  EC 81 MG tablet Take 1 tablet (81 mg total) by mouth daily. Swallow whole.   budesonide -formoterol  160-4.5 MCG/ACT inhaler Commonly known as: SYMBICORT  Inhale 2 puffs into the lungs 2 (two) times daily.   Compressor/Nebulizer Misc Nebulizer and tubes/mask to use as directed with DuoNeb as needed   HYDROcodone -acetaminophen  7.5-325 MG tablet Commonly known as: NORCO Take 1 tablet by mouth every 6 (six) hours as needed for up to 3 days for moderate pain (pain score 4-6).   lovastatin  10 MG tablet Commonly known as: MEVACOR  Take 1 tablet (10 mg total) by mouth at bedtime.   metFORMIN  500 MG tablet Commonly known as: GLUCOPHAGE  Take 1 tablet (500 mg total) by mouth daily with breakfast.   nicotine  21 mg/24hr patch Commonly known as: NICODERM CQ  - dosed in mg/24 hours Place 1 patch (21 mg total) onto the skin daily.   omeprazole  20 MG capsule Commonly known as: PRILOSEC Take 1 capsule (20 mg total) by mouth daily.   sertraline  100 MG tablet Commonly known as: ZOLOFT  TAKE 1 TABLET BY MOUTH ONCE DAILY  traZODone  150 MG tablet Commonly known as: DESYREL  Take 1 tablet (150 mg total) by mouth at bedtime as needed for sleep.   Ventolin  HFA 108 (90 Base) MCG/ACT inhaler Generic drug: albuterol  Inhale 1-2 puffs into the lungs every 6 (six) hours as needed.   Zenpep   15000-47000 units Cpep Generic drug: Pancrelipase  (Lip-Prot-Amyl) Take 1 capsule (15,000 Units total) by mouth 3 (three) times daily.        Follow-up Information     [facility]. Go on 01/25/2024.   Specialty: Cardiology Why: Hospital Follow-Up 01/25/2024 Please bring all your medications to follow-up apppointment [facility], [address] Floor, [address] Free Valet Parking at the [employer] information: [address] [place]  [address] [phone]        Avonne Boettcher, MD. Go on 02/01/2024.   Specialty: Oncology Contact information: [address] [place] [address] [phone]         Cleve Dale, MD. Go on 01/24/2024.   Specialties: Pulmonary Disease, Cardiology Contact information: [address] [place] [address] [phone]                 Subjective   Pt reports no chest pain or SOB at rest. He is still experiencing some level of shock about the news of CT findings.   All questions and concerns were addressed at time of discharge.  Objective  Blood pressure (!) 152/99, pulse 71, temperature 97.8 F (36.6 C), temperature source Oral, resp. rate 16, height 5\' 7"  (1.702 m), weight 76.7 kg, SpO2 100%.   General: Pt is alert, awake, not in acute distress Cardiovascular: RRR, S1/S2 +, no rubs, no gallops Respiratory: CTA bilaterally, no wheezing, no rhonchi Abdominal: Soft, NT, ND, bowel sounds + Extremities: no edema, no cyanosis  The results of significant diagnostics from this hospitalization (including imaging, microbiology, ancillary and laboratory) are listed below for reference.   Imaging studies: MR BRAIN W WO CONTRAST Result Date: 01/17/2024 CLINICAL DATA:  evaluate for metastasis with primary lung neoplasm on ct scan EXAM: MRI HEAD WITHOUT AND WITH CONTRAST TECHNIQUE: Multiplanar, multiecho pulse sequences of the brain and surrounding  structures were obtained without and with intravenous contrast. CONTRAST:  7mL GADAVIST  GADOBUTROL  1 MMOL/ML IV SOLN COMPARISON:  None Available. FINDINGS: Brain: No acute infarction, hemorrhage, hydrocephalus, extra-axial collection or mass lesion. Remote lacunar infarcts in the cerebellum bilaterally as well as in the right parietal cortex. Additional scattered T2/FLAIR hyperintensities the white matter are compatible with chronic microvascular ischemic disease. No pathologic enhancement. Vascular: Major arterial flow voids are maintained at the skull base. Skull and upper cervical spine: Normal marrow signal. Sinuses/Orbits: Mild paranasal sinus mucosal thickening. No acute orbital findings. Other: No mastoid effusions. IMPRESSION: 1. No evidence of acute intracranial abnormality or metastatic disease. 2. 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 Electronically Signed   By: Stevenson Elbe M.D.   On: 01/17/2024 19:45   CT CHEST WO CONTRAST Result Date: 01/16/2024 EXAM: CT CHEST WITHOUT CONTRAST 01/16/2024 04:33:00 PM TECHNIQUE: CT of the chest was performed without the administration of intravenous contrast. Multiplanar reformatted images are provided for review. Automated exposure control, iterative reconstruction, and/or weight based adjustment of the mA/kV was utilized to reduce the radiation dose to as low as reasonably achievable. COMPARISON: Chest radiograph earlier today. CLINICAL HISTORY: Abnormal xray - lung nodule, >= 1 cm. FINDINGS: MEDIASTINUM: Heart and pericardium are unremarkable. The central airways are clear. LYMPH  NODES: 14 mm short axis right paratracheal node (image 49) and a 14 mm short axis prevascular node (image 52), suspicious for thoracic nodal metastases. LUNGS AND PLEURA: 3.7 x 1.8 cm lobulated mass in the left upper lobe abutting the chest wall (image 52), suspicious for primary bronchogenic carcinoma. Adjacent satellite nodularity in the left upper lobe.  Moderate centrilobular and paraseptal emphysematous changes, upper lung predominant. Lower lobe predominant subpleural reticulation/fibrosis, favoring superimposed chronic lung disease. No pleural effusion or pneumothorax. SOFT TISSUES/BONES: Mild degenerative changes of the lower thoracic spine. No acute abnormality of the soft tissues. UPPER ABDOMEN: Diffuse parenchymal calcifications along the pancreatic body and tail, reflecting sequela of prior or chronic pancreatitis. IMPRESSION: 1. 3.7 x 1.8 cm lobulated mass in the left upper lobe abutting the chest wall, suspicious for primary bronchogenic carcinoma, with adjacent satellite nodularity. 2. 14 mm short axis right paratracheal node and a 14 mm short axis prevascular node, suspicious for thoracic nodal metastases. Electronically signed by: Zadie Herter MD 01/16/2024 07:54 PM EDT RP Workstation: [id number]   DG Chest 1 View Result Date: 01/16/2024 CLINICAL DATA:  CHF. EXAM: CHEST  1 VIEW COMPARISON:  01/15/2024 FINDINGS: Cardiopericardial silhouette is at upper limits of normal for size. There is pulmonary vascular congestion without overt pulmonary edema. Nodular density projects over the lateral left mid lung, potentially obscured by the cardiac lead on the prior study. No acute bony abnormality. Telemetry leads overlie the chest. IMPRESSION: 1. Nodular density projects over the lateral left mid lung. CT chest without contrast recommended to further evaluate. 2. Pulmonary vascular congestion without overt airspace edema. Electronically Signed   By: Donnal Fusi M.D.   On: 01/16/2024 09:14   ECHOCARDIOGRAM COMPLETE Result Date: 01/15/2024    ECHOCARDIOGRAM REPORT   Patient Name:   Noah Velasquez Date of Exam: 01/15/2024 Medical Rec #:  [id number]     Height:       67.0 in Accession #:    [id number]    Weight:       174.0 lb Date of Birth:  [birth year]    BSA:          1.906 m Patient Age:    57 years      BP:           150/100 mmHg Patient Gender: M              HR:           95 bpm. Exam Location:  [facility] Procedure: 2D Echo, Cardiac Doppler and Color Doppler (Both Spectral and Color            Flow Doppler were utilized during procedure). Indications:     CHF I50.31  History:         Patient has no prior history of Echocardiogram examinations.  Sonographer:     Clenton Czech RDCS, FASE Referring Phys:  [id number] [place] Diagnosing Phys: Antonette Batters MD IMPRESSIONS  1. Left ventricular ejection fraction, by estimation, is 50 to 55%. The left ventricle has normal function. The left ventricle has no regional wall motion abnormalities. There is mild left ventricular hypertrophy. Left ventricular diastolic parameters are consistent with Grade I diastolic dysfunction (impaired relaxation).  2. Right ventricular systolic function is normal. The right ventricular size is normal.  3. The mitral valve is normal in structure. Trivial mitral valve regurgitation.  4. The aortic valve is normal in structure. Aortic valve regurgitation is not visualized. Aortic valve sclerosis is present, with no evidence  of aortic valve stenosis. FINDINGS  Left Ventricle: Left ventricular ejection fraction, by estimation, is 50 to 55%. The left ventricle has normal function. The left ventricle has no regional wall motion abnormalities. Strain was performed and the global longitudinal strain is indeterminate. Global longitudinal strain performed but not reported based on interpreter judgement due to suboptimal tracking. The left ventricular internal cavity size was normal in size. There is mild left ventricular hypertrophy. Left ventricular diastolic parameters are consistent with Grade I diastolic dysfunction (impaired relaxation). Right Ventricle: The right ventricular size is normal. No increase in right ventricular wall thickness. Right ventricular systolic function is normal. Left Atrium: Left atrial size was normal in size. Right Atrium: Right atrial size was normal in size.  Pericardium: There is no evidence of pericardial effusion. Mitral Valve: The mitral valve is normal in structure. Trivial mitral valve regurgitation. Tricuspid Valve: The tricuspid valve is normal in structure. Tricuspid valve regurgitation is mild. Aortic Valve: The aortic valve is normal in structure. Aortic valve regurgitation is not visualized. Aortic regurgitation PHT measures 750 msec. Aortic valve sclerosis is present, with no evidence of aortic valve stenosis. Aortic valve mean gradient measures 8.0 mmHg. Aortic valve peak gradient measures 14.7 mmHg. Aortic valve area, by VTI measures 2.40 cm. Pulmonic Valve: The pulmonic valve was normal in structure. Pulmonic valve regurgitation is not visualized. Aorta: The ascending aorta was not well visualized. IAS/Shunts: No atrial level shunt detected by color flow Doppler. Additional Comments: 3D was performed not requiring image post processing on an independent workstation and was indeterminate.  LEFT VENTRICLE PLAX 2D LVIDd:         4.90 cm     Diastology LVIDs:         3.50 cm     LV e' medial:    6.53 cm/s LV PW:         1.30 cm     LV E/e' medial:  13.2 LV IVS:        1.20 cm     LV e' lateral:   11.90 cm/s LVOT diam:     2.20 cm     LV E/e' lateral: 7.3 LV SV:         80 LV SV Index:   42 LVOT Area:     3.80 cm  LV Volumes (MOD) LV vol d, MOD A4C: 87.7 ml LV vol s, MOD A4C: 36.1 ml LV SV MOD A4C:     87.7 ml RIGHT VENTRICLE RV Basal diam:  3.20 cm RV S prime:     12.20 cm/s TAPSE (M-mode): 2.0 cm LEFT ATRIUM             Index        RIGHT ATRIUM           Index LA diam:        3.50 cm 1.84 cm/m   RA Area:     16.40 cm LA Vol (A2C):   63.5 ml 33.32 ml/m  RA Volume:   44.90 ml  23.56 ml/m LA Vol (A4C):   51.0 ml 26.76 ml/m LA Biplane Vol: 57.7 ml 30.27 ml/m  AORTIC VALVE                     PULMONIC VALVE AV Area (Vmax):    2.38 cm      PV Vmax:       1.25 m/s AV Area (Vmean):   2.30 cm      PV Peak grad:  6.2 mmHg AV Area (VTI):     2.40 cm AV  Vmax:           191.50 cm/s AV Vmean:          128.000 cm/s AV VTI:            0.334 m AV Peak Grad:      14.7 mmHg AV Mean Grad:      8.0 mmHg LVOT Vmax:         120.00 cm/s LVOT Vmean:        77.400 cm/s LVOT VTI:          0.211 m LVOT/AV VTI ratio: 0.63 AI PHT:            750 msec  AORTA Ao Root diam: 3.80 cm MITRAL VALVE                TRICUSPID VALVE MV Area (PHT): 4.96 cm     TR Peak grad:   36.2 mmHg MV Decel Time: 153 msec     TR Vmax:        301.00 cm/s MV E velocity: 86.50 cm/s MV A velocity: 101.00 cm/s  SHUNTS MV E/A ratio:  0.86         Systemic VTI:  0.21 m                             Systemic Diam: 2.20 cm Antonette Batters MD Electronically signed by Antonette Batters MD Signature Date/Time: 01/15/2024/10:40:32 AM    Final    DG Chest Port 1 View Result Date: 01/15/2024 CLINICAL DATA:  Shortness of breath. History of COPD with inhaler. Decreased oxygen saturation. EXAM: PORTABLE CHEST 1 VIEW COMPARISON:  07/31/2022 FINDINGS: Borderline heart size with mild pulmonary vascular congestion. Diffuse interstitial pattern to the lungs likely representing interstitial edema. Peribronchial thickening consistent with chronic bronchitis. No focal consolidation. No pleural effusion or pneumothorax. Mediastinal contours appear intact. IMPRESSION: Pulmonary vascular congestion with diffuse interstitial pattern to the lungs likely edema. Electronically Signed   By: Boyce Byes M.D.   On: 01/15/2024 00:15    Labs: Basic Metabolic Panel: Recent Labs  Lab 01/15/24 0211 01/16/24 0702 01/17/24 1043  NA 131* 130* 133*  K 3.9 3.8 3.5  CL 101 99 97*  CO2 21* 21* 25  GLUCOSE 177* 176* 172*  BUN 11 20 20   CREATININE 0.97 0.94 1.05  CALCIUM  8.7* 8.7* 8.6*   CBC: Recent Labs  Lab 01/15/24 0013 01/16/24 0702  WBC 7.9 10.7*  NEUTROABS 4.5  --   HGB 14.2 14.4  HCT 41.4 41.2  MCV 91.8 93.0  PLT 271 267   Microbiology: Results for orders placed or performed during the hospital encounter of  01/14/24  Expectorated Sputum Assessment w Gram Stain, Rflx to Resp Cult     Status: None   Collection Time: 01/15/24 11:39 AM   Specimen: Sputum  Result Value Ref Range Status   Specimen Description SPUTUM  Final   Special Requests NONE  Final   Sputum evaluation   Final    Sputum specimen not acceptable for testing.  Please recollect.   CALLED TO SUSANA PATION @ 1443 ON 01/15/2024 BY CAF Performed at [facility], [address] [place] [place], [place] [address]    Report Status 01/15/2024 FINAL  Final    Time coordinating discharge: Over 30 minutes  Ree Candy, MD  [facility] 01/20/2024, 6:27 PM

## 2024-01-17 NOTE — TOC Progression Note (Signed)
 Transition of Care [facility]) - Progression Note    Patient Details  Name: Noah Velasquez MRN: [id number] Date of Birth: [birth year]  Transition of Care [facility]) CM/SW Contact  Loman Risk, RN Phone Number: 01/17/2024, 3:55 PM  Clinical Narrative:        Portable home o2 was delivered to room yesterday. Qualifying sats are only valid for 48 hours  - [facility], [facility], [facility], [facility], and [facility] are also unable to accept referral     Expected Discharge Plan and Services                                               Social Determinants of Health (SDOH) Interventions SDOH Screenings   Food Insecurity: Food Insecurity Present (01/15/2024)  Housing: Low Risk  (01/15/2024)  Transportation Needs: No Transportation Needs (01/15/2024)  Utilities: Not At Risk (01/15/2024)  Alcohol Screen: Low Risk  (04/07/2023)  Depression (PHQ2-9): Low Risk  (04/07/2023)  Tobacco Use: High Risk (01/15/2024)    Readmission Risk Interventions     No data to display

## 2024-01-17 NOTE — Progress Notes (Signed)
 PT Cancellation Note  Patient Details Name: Noah Velasquez MRN: [id number] DOB: [birth year]   Cancelled Treatment:    Reason Eval/Treat Not Completed: Patient declined, no reason specified.  Pt resting in bed upon PT arrival; pt reports not being up to therapy at this time.  Will re-attempt PT session at a later date/time.  Amador Junes, PT 01/17/24, 3:33 PM

## 2024-01-17 NOTE — Plan of Care (Signed)

## 2024-01-17 NOTE — Consult Note (Signed)
 [facility] [place] Pulmonary Medicine Consultation      Date: 01/17/2024,   MRN# [id number] Forney Kleinpeter 10/25/1965     CHIEF COMPLAINT:   Abnormal CT chest   HISTORY OF PRESENT ILLNESS   58 year old pleasant African-American male seen today for assessment of abnormal CT chest  Patient admitted for increased work of breathing and shortness of breath, patient treated for COPD exacerbation chest x-ray obtained showed left lung abnormality follow-up CT chest showed  3.7 x 1.8 cm lobulated mass in the left upper lobe abutting the chest wall, suspicious for primary bronchogenic carcinoma, with adjacent satellite nodularity. 2. 14 mm short axis right paratracheal node and a 14 mm short axis prevascular node, suspicious for thoracic nodal metastases.   The Risks and Benefits of the Bronchoscopy procedure with NAVIGATION AND ENDOBRONCHIAL  were explained to patient.  I have discussed the risk for Acute Bleeding, increased chance of Infection, increased chance of Respiratory Failure and Cardiac Arrest, increased chance of pneumothorax and collapsed lung, as well as increased Stroke and Death.  I have also explained to avoid all types of NSAIDs 1 week prior to procedure date  to decrease chance of bleeding, and also to avoid food and drinks the midnight prior to procedure.  The procedure consists of a video camera with a light source to be placed and inserted  into the lungs to  look for abnormal tissue and to obtain tissue samples by using needle and biopsy tools.  The patient/family understand the risks and benefits and have agreed to proceed with procedure.  Plan for June 9th, Monday  PAST MEDICAL HISTORY   Past Medical History:  Diagnosis Date   Chronic hip pain    Chronic pancreatitis (HCC)    CKD (chronic kidney disease) stage 3, GFR 30-59 ml/min (HCC)    GERD (gastroesophageal reflux disease)    Hyperlipidemia    Hypertension    Osteoarthritis    Pancreatitis    Pancreatitis     PTSD (post-traumatic stress disorder)    Viral infection of left eye      SURGICAL HISTORY   Past Surgical History:  Procedure Laterality Date   pancreatic stent placement       FAMILY HISTORY   Family History  Problem Relation Age of Onset   Diabetes Mother    Sleep apnea Mother    Emphysema Sister    CAD Neg Hx    Mental illness Neg Hx      SOCIAL HISTORY   Social History   Tobacco Use   Smoking status: Every Day    Current packs/day: 0.25    Average packs/day: 0.3 packs/day for 15.0 years (3.8 ttl pk-yrs)    Types: Cigarettes   Smokeless tobacco: Never  Vaping Use   Vaping status: Some Days  Substance Use Topics   Alcohol use: Yes    Alcohol/week: 7.0 standard drinks of alcohol    Types: 7 Cans of beer per week   Drug use: Yes    Types: Marijuana     MEDICATIONS    Home Medication:    Current Medication:  Current Facility-Administered Medications:    acetaminophen  (TYLENOL ) tablet 650 mg, 650 mg, Oral, Q6H PRN, 650 mg at 01/15/24 0935 **OR** acetaminophen  (TYLENOL ) suppository 650 mg, 650 mg, Rectal, Q6H PRN, Mansy, Jan A, MD   amLODipine  (NORVASC ) tablet 10 mg, 10 mg, Oral, Daily, Meegan, Eryn, RPH, 10 mg at 01/17/24 1007   amoxicillin -clavulanate (AUGMENTIN ) 875-125 MG per tablet 1 tablet, 1 tablet, Oral, Q12H,  Melvinia Stager, MD, 1 tablet at 01/17/24 1009   aspirin  EC tablet 81 mg, 81 mg, Oral, Daily, Antoniette Batty T, MD, 81 mg at 01/17/24 1007   budesonide  (PULMICORT ) nebulizer solution 0.5 mg, 0.5 mg, Nebulization, BID, Shina Wass, MD   enoxaparin  (LOVENOX ) injection 40 mg, 40 mg, Subcutaneous, Q24H, Mansy, Jan A, MD, 40 mg at 01/17/24 1007   feeding supplement (GLUCERNA SHAKE) (GLUCERNA SHAKE) liquid 237 mL, 237 mL, Oral, TID BM, Antoniette Batty T, MD, 237 mL at 01/17/24 1009   furosemide (LASIX) tablet 20 mg, 20 mg, Oral, Daily, Patel, Sona, MD, 20 mg at 01/17/24 1007   guaiFENesin  (MUCINEX ) 12 hr tablet 600 mg, 600 mg, Oral, BID, Mansy, Jan A, MD,  600 mg at 01/17/24 1008   guaiFENesin -dextromethorphan  (ROBITUSSIN DM) 100-10 MG/5ML syrup 5 mL, 5 mL, Oral, Q4H PRN, Antoniette Batty T, MD, 5 mL at 01/16/24 0440   HYDROcodone -acetaminophen  (NORCO) 7.5-325 MG per tablet 1 tablet, 1 tablet, Oral, Q6H PRN, Antoniette Batty T, MD, 1 tablet at 01/17/24 1055   insulin aspart (novoLOG) injection 0-15 Units, 0-15 Units, Subcutaneous, TID WC, Meegan, Eryn, RPH, 5 Units at 01/17/24 0915   insulin glargine-yfgn (SEMGLEE) injection 10 Units, 10 Units, Subcutaneous, Q24H, Antoniette Batty T, MD, 10 Units at 01/16/24 1242   ipratropium-albuterol  (DUONEB) 0.5-2.5 (3) MG/3ML nebulizer solution 3 mL, 3 mL, Nebulization, Q4H, Zackrey Dyar, MD   irbesartan (AVAPRO) tablet 300 mg, 300 mg, Oral, Daily, Meegan, Eryn, RPH, 300 mg at 01/17/24 1007   LORazepam  (ATIVAN ) tablet 1 mg, 1 mg, Oral, Q6H PRN, Ree Candy, MD, 1 mg at 01/17/24 1216   magnesium  hydroxide (MILK OF MAGNESIA) suspension 30 mL, 30 mL, Oral, Daily PRN, Mansy, Jan A, MD   metFORMIN  (GLUCOPHAGE ) tablet 500 mg, 500 mg, Oral, Q breakfast, Antoniette Batty T, MD, 500 mg at 01/17/24 1007   multivitamin with minerals tablet 1 tablet, 1 tablet, Oral, Daily, Antoniette Batty T, MD, 1 tablet at 01/17/24 1008   nicotine  (NICODERM CQ  - dosed in mg/24 hours) patch 21 mg, 21 mg, Transdermal, Daily, Antoniette Batty T, MD, 21 mg at 01/17/24 1009   ondansetron  (ZOFRAN ) tablet 4 mg, 4 mg, Oral, Q6H PRN **OR** ondansetron  (ZOFRAN ) injection 4 mg, 4 mg, Intravenous, Q6H PRN, Mansy, Jan A, MD   pantoprazole  (PROTONIX ) EC tablet 40 mg, 40 mg, Oral, Daily, Antoniette Batty T, MD, 40 mg at 01/17/24 1007   polyvinyl alcohol (LIQUIFILM TEARS) 1.4 % ophthalmic solution 1 drop, 1 drop, Both Eyes, PRN, Patel, Sona, MD   pravastatin (PRAVACHOL) tablet 10 mg, 10 mg, Oral, q1800, Antoniette Batty T, MD, 10 mg at 01/16/24 1735   [COMPLETED] methylPREDNISolone  sodium succinate (SOLU-MEDROL ) 40 mg/mL injection 40 mg, 40 mg, Intravenous, Q12H, 40 mg at 01/15/24 2019  **FOLLOWED BY** predniSONE  (DELTASONE ) tablet 40 mg, 40 mg, Oral, Q breakfast, Mansy, Jan A, MD, 40 mg at 01/17/24 1008   sertraline  (ZOLOFT ) tablet 100 mg, 100 mg, Oral, Daily, Antoniette Batty T, MD, 100 mg at 01/17/24 1008   thiamine  (VITAMIN B1) tablet 100 mg, 100 mg, Oral, Daily, Vallery Gavel, Jade J, MD, 100 mg at 01/17/24 1007   traZODone  (DESYREL ) tablet 25 mg, 25 mg, Oral, QHS PRN, Mansy, Jan A, MD    ALLERGIES   Hctz [hydrochlorothiazide]   BP 138/87 (BP Location: Left Arm)   Pulse 75   Temp 97.9 F (36.6 C) (Oral)   Resp 18   Ht 5\' 7"  (1.702 m)   Wt 76.7 kg   SpO2  95%   BMI 26.47 kg/m    Review of Systems: Gen:  Denies  fever, sweats, chills weight loss  HEENT: Denies blurred vision, double vision, ear pain, eye pain, hearing loss, nose bleeds, sore throat Cardiac:  No dizziness, chest pain or heaviness, chest tightness,edema, No JVD Resp:   No cough, -sputum production, +shortness of breath,+wheezing, -hemoptysis,  Other:  All other systems negative   Physical Examination:   General Appearance: No distress  EYES PERRLA, EOM intact.   NECK Supple, No JVD Pulmonary: normal breath sounds, No wheezing.  CardiovascularNormal S1,S2.  No m/r/g.   Abdomen: Benign, Soft, non-tender. Neurology UE/LE 5/5 strength, no focal deficits Ext pulses intact, cap refill intact ALL OTHER ROS ARE NEGATIVE      IMAGING    CT CHEST WO CONTRAST Result Date: 01/16/2024 EXAM: CT CHEST WITHOUT CONTRAST 01/16/2024 04:33:00 PM TECHNIQUE: CT of the chest was performed without the administration of intravenous contrast. Multiplanar reformatted images are provided for review. Automated exposure control, iterative reconstruction, and/or weight based adjustment of the mA/kV was utilized to reduce the radiation dose to as low as reasonably achievable. COMPARISON: Chest radiograph earlier today. CLINICAL HISTORY: Abnormal xray - lung nodule, >= 1 cm. FINDINGS: MEDIASTINUM: Heart and pericardium are  unremarkable. The central airways are clear. LYMPH NODES: 14 mm short axis right paratracheal node (image 49) and a 14 mm short axis prevascular node (image 52), suspicious for thoracic nodal metastases. LUNGS AND PLEURA: 3.7 x 1.8 cm lobulated mass in the left upper lobe abutting the chest wall (image 52), suspicious for primary bronchogenic carcinoma. Adjacent satellite nodularity in the left upper lobe. Moderate centrilobular and paraseptal emphysematous changes, upper lung predominant. Lower lobe predominant subpleural reticulation/fibrosis, favoring superimposed chronic lung disease. No pleural effusion or pneumothorax. SOFT TISSUES/BONES: Mild degenerative changes of the lower thoracic spine. No acute abnormality of the soft tissues. UPPER ABDOMEN: Diffuse parenchymal calcifications along the pancreatic body and tail, reflecting sequela of prior or chronic pancreatitis. IMPRESSION: 1. 3.7 x 1.8 cm lobulated mass in the left upper lobe abutting the chest wall, suspicious for primary bronchogenic carcinoma, with adjacent satellite nodularity. 2. 14 mm short axis right paratracheal node and a 14 mm short axis prevascular node, suspicious for thoracic nodal metastases. Electronically signed by: Zadie Herter MD 01/16/2024 07:54 PM EDT RP Workstation: [id number]   DG Chest 1 View Result Date: 01/16/2024 CLINICAL DATA:  CHF. EXAM: CHEST  1 VIEW COMPARISON:  01/15/2024 FINDINGS: Cardiopericardial silhouette is at upper limits of normal for size. There is pulmonary vascular congestion without overt pulmonary edema. Nodular density projects over the lateral left mid lung, potentially obscured by the cardiac lead on the prior study. No acute bony abnormality. Telemetry leads overlie the chest. IMPRESSION: 1. Nodular density projects over the lateral left mid lung. CT chest without contrast recommended to further evaluate. 2. Pulmonary vascular congestion without overt airspace edema. Electronically Signed   By:  Donnal Fusi M.D.   On: 01/16/2024 09:14   ECHOCARDIOGRAM COMPLETE Result Date: 01/15/2024    ECHOCARDIOGRAM REPORT   Patient Name:   PIERS BAADE Date of Exam: 01/15/2024 Medical Rec #:  [id number]     Height:       67.0 in Accession #:    [id number]    Weight:       174.0 lb Date of Birth:  17-Aug-1966    BSA:          1.906 m Patient Age:    58 years  BP:           150/100 mmHg Patient Gender: M             HR:           95 bpm. Exam Location:  [facility] Procedure: 2D Echo, Cardiac Doppler and Color Doppler (Both Spectral and Color            Flow Doppler were utilized during procedure). Indications:     CHF I50.31  History:         Patient has no prior history of Echocardiogram examinations.  Sonographer:     Clenton Czech RDCS, FASE Referring Phys:  [id number] [place] Diagnosing Phys: Antonette Batters MD IMPRESSIONS  1. Left ventricular ejection fraction, by estimation, is 50 to 55%. The left ventricle has normal function. The left ventricle has no regional wall motion abnormalities. There is mild left ventricular hypertrophy. Left ventricular diastolic parameters are consistent with Grade I diastolic dysfunction (impaired relaxation).  2. Right ventricular systolic function is normal. The right ventricular size is normal.  3. The mitral valve is normal in structure. Trivial mitral valve regurgitation.  4. The aortic valve is normal in structure. Aortic valve regurgitation is not visualized. Aortic valve sclerosis is present, with no evidence of aortic valve stenosis. FINDINGS  Left Ventricle: Left ventricular ejection fraction, by estimation, is 50 to 55%. The left ventricle has normal function. The left ventricle has no regional wall motion abnormalities. Strain was performed and the global longitudinal strain is indeterminate. Global longitudinal strain performed but not reported based on interpreter judgement due to suboptimal tracking. The left ventricular internal cavity size was normal in size.  There is mild left ventricular hypertrophy. Left ventricular diastolic parameters are consistent with Grade I diastolic dysfunction (impaired relaxation). Right Ventricle: The right ventricular size is normal. No increase in right ventricular wall thickness. Right ventricular systolic function is normal. Left Atrium: Left atrial size was normal in size. Right Atrium: Right atrial size was normal in size. Pericardium: There is no evidence of pericardial effusion. Mitral Valve: The mitral valve is normal in structure. Trivial mitral valve regurgitation. Tricuspid Valve: The tricuspid valve is normal in structure. Tricuspid valve regurgitation is mild. Aortic Valve: The aortic valve is normal in structure. Aortic valve regurgitation is not visualized. Aortic regurgitation PHT measures 750 msec. Aortic valve sclerosis is present, with no evidence of aortic valve stenosis. Aortic valve mean gradient measures 8.0 mmHg. Aortic valve peak gradient measures 14.7 mmHg. Aortic valve area, by VTI measures 2.40 cm. Pulmonic Valve: The pulmonic valve was normal in structure. Pulmonic valve regurgitation is not visualized. Aorta: The ascending aorta was not well visualized. IAS/Shunts: No atrial level shunt detected by color flow Doppler. Additional Comments: 3D was performed not requiring image post processing on an independent workstation and was indeterminate.  LEFT VENTRICLE PLAX 2D LVIDd:         4.90 cm     Diastology LVIDs:         3.50 cm     LV e' medial:    6.53 cm/s LV PW:         1.30 cm     LV E/e' medial:  13.2 LV IVS:        1.20 cm     LV e' lateral:   11.90 cm/s LVOT diam:     2.20 cm     LV E/e' lateral: 7.3 LV SV:         80  LV SV Index:   42 LVOT Area:     3.80 cm  LV Volumes (MOD) LV vol d, MOD A4C: 87.7 ml LV vol s, MOD A4C: 36.1 ml LV SV MOD A4C:     87.7 ml RIGHT VENTRICLE RV Basal diam:  3.20 cm RV S prime:     12.20 cm/s TAPSE (M-mode): 2.0 cm LEFT ATRIUM             Index        RIGHT ATRIUM            Index LA diam:        3.50 cm 1.84 cm/m   RA Area:     16.40 cm LA Vol (A2C):   63.5 ml 33.32 ml/m  RA Volume:   44.90 ml  23.56 ml/m LA Vol (A4C):   51.0 ml 26.76 ml/m LA Biplane Vol: 57.7 ml 30.27 ml/m  AORTIC VALVE                     PULMONIC VALVE AV Area (Vmax):    2.38 cm      PV Vmax:       1.25 m/s AV Area (Vmean):   2.30 cm      PV Peak grad:  6.2 mmHg AV Area (VTI):     2.40 cm AV Vmax:           191.50 cm/s AV Vmean:          128.000 cm/s AV VTI:            0.334 m AV Peak Grad:      14.7 mmHg AV Mean Grad:      8.0 mmHg LVOT Vmax:         120.00 cm/s LVOT Vmean:        77.400 cm/s LVOT VTI:          0.211 m LVOT/AV VTI ratio: 0.63 AI PHT:            750 msec  AORTA Ao Root diam: 3.80 cm MITRAL VALVE                TRICUSPID VALVE MV Area (PHT): 4.96 cm     TR Peak grad:   36.2 mmHg MV Decel Time: 153 msec     TR Vmax:        301.00 cm/s MV E velocity: 86.50 cm/s MV A velocity: 101.00 cm/s  SHUNTS MV E/A ratio:  0.86         Systemic VTI:  0.21 m                             Systemic Diam: 2.20 cm Antonette Batters MD Electronically signed by Antonette Batters MD Signature Date/Time: 01/15/2024/10:40:32 AM    Final    DG Chest Port 1 View Result Date: 01/15/2024 CLINICAL DATA:  Shortness of breath. History of COPD with inhaler. Decreased oxygen saturation. EXAM: PORTABLE CHEST 1 VIEW COMPARISON:  07/31/2022 FINDINGS: Borderline heart size with mild pulmonary vascular congestion. Diffuse interstitial pattern to the lungs likely representing interstitial edema. Peribronchial thickening consistent with chronic bronchitis. No focal consolidation. No pleural effusion or pneumothorax. Mediastinal contours appear intact. IMPRESSION: Pulmonary vascular congestion with diffuse interstitial pattern to the lungs likely edema. Electronically Signed   By: Boyce Byes M.D.   On: 01/15/2024 00:15      ASSESSMENT/PLAN   58 year old African American  male admitted with COPD exacerbation likely  related from tobacco abuse with newly found left lung mass with cell adenopathy highly suspicious for primary lung cancer  Findings reviewed with patient in detail CT scans of the chest reviewed with patient in detail  -I have explained the risks and benefits with the patient -At this time we will continue prednisone  nebulizer therapy -Continue oxygen as prescribed - Plan for robotics navigation and endobronchial ultrasound June 9    MEDICATION ADJUSTMENTS/LABS AND TESTS    CURRENT MEDICATIONS REVIEWED AT LENGTH WITH PATIENT TODAY   Patient  satisfied with Plan of action and management. All questions answered  I spent a total of 85 minutes reviewing chart data, face-to-face evaluation with the patient, counseling and coordination of care as detailed above.     Lady Pier, M.D.  [facility]  Medical Director [facility] Medical Director [facility] Cardio-Pulmonary Department

## 2024-01-17 NOTE — Telephone Encounter (Signed)
 Patient Product/process development scientist completed.    The patient is insured through [facility]     Ran test claim for Entresto 24-26 mg and the current 30 day co-pay is $4.00.  Ran test claim for Farxiga 10 mg and the current 30 day co-pay is $4.00.  Ran test claim for Jardiance 10 mg and the current 30 day co-pay is $4.00.  This test claim was processed through [facility]- copay amounts may vary at other pharmacies due to pharmacy/plan contracts, or as the patient moves through the different stages of their insurance plan.     Morgan Arab, CPHT Pharmacy Technician III Certified Patient Advocate [facility] Patient Advocate Team Direct Number: [phone]  Fax: [phone]

## 2024-01-17 NOTE — Progress Notes (Signed)
 OT Cancellation Note  Patient Details Name: Noah Velasquez MRN: [id number] DOB: Sep 09, 1965   Cancelled Treatment:    Reason Eval/Treat Not Completed: Other (comment). Upon attempt, MD entered to discuss recent findings and plan of care. Will re-attempt at later date/time.   Ceferino Lang R., MPH, MS, OTR/L ascom [phone] 01/17/24, 10:10 AM

## 2024-01-18 ENCOUNTER — Other Ambulatory Visit: Payer: Self-pay

## 2024-01-18 ENCOUNTER — Encounter: Payer: Self-pay | Admitting: Internal Medicine

## 2024-01-18 ENCOUNTER — Encounter: Payer: Self-pay | Admitting: *Deleted

## 2024-01-18 DIAGNOSIS — J449 Chronic obstructive pulmonary disease, unspecified: Secondary | ICD-10-CM | POA: Diagnosis not present

## 2024-01-18 DIAGNOSIS — J441 Chronic obstructive pulmonary disease with (acute) exacerbation: Secondary | ICD-10-CM | POA: Diagnosis not present

## 2024-01-18 DIAGNOSIS — C3492 Malignant neoplasm of unspecified part of left bronchus or lung: Secondary | ICD-10-CM

## 2024-01-18 DIAGNOSIS — R918 Other nonspecific abnormal finding of lung field: Secondary | ICD-10-CM

## 2024-01-18 DIAGNOSIS — R0602 Shortness of breath: Secondary | ICD-10-CM | POA: Diagnosis not present

## 2024-01-18 LAB — GLUCOSE, CAPILLARY
Glucose-Capillary: 117 mg/dL — ABNORMAL HIGH (ref 70–99)
Glucose-Capillary: 183 mg/dL — ABNORMAL HIGH (ref 70–99)

## 2024-01-18 MED ORDER — AMOXICILLIN-POT CLAVULANATE 875-125 MG PO TABS
1.0000 | ORAL_TABLET | Freq: Two times a day (BID) | ORAL | 0 refills | Status: AC
  Filled 2024-01-18: qty 10, 5d supply, fill #0

## 2024-01-18 MED ORDER — HYDROCODONE-ACETAMINOPHEN 7.5-325 MG PO TABS
1.0000 | ORAL_TABLET | Freq: Four times a day (QID) | ORAL | 0 refills | Status: AC | PRN
  Filled 2024-01-18: qty 12, 3d supply, fill #0

## 2024-01-18 MED ORDER — IPRATROPIUM-ALBUTEROL 0.5-2.5 (3) MG/3ML IN SOLN
3.0000 mL | Freq: Four times a day (QID) | RESPIRATORY_TRACT | Status: DC
  Administered 2024-01-18 (×2): 3 mL via RESPIRATORY_TRACT
  Filled 2024-01-18 (×2): qty 3

## 2024-01-18 NOTE — Discharge Instructions (Addendum)
 [name],  Please continue to wear your oxygen at all times for 2L and you may turn it up if you are being active and feel short of breath.  You have an appointment Monday for bronchoscopy with a biopsy of the lung mass found on your chest CT scan.  Here are their instructions for that appointment with Dr. Auston Left, pulmonology: avoid all types of NSAIDs 1 week prior to procedure date  to decrease chance of bleeding, and also to avoid food and drinks the midnight prior to procedure.   The procedure consists of a video camera with a light source to be placed and inserted  into the lungs to  look for abnormal tissue and to obtain tissue samples by using needle and biopsy tools.  You will also follow up with Dr Randy Buttery, oncology following your biopsy results to set up a PET scan and discuss treatment options.   Food Resources  Agency Name: [facility] Agency Address: [address], [place], [place] [address] Phone: [phone] Website: [email or web address] Service(s) Offered: Housing services, self-sufficiency, congregate meal program, weatherization program, Event organiser program, emergency food assistance,  housing counseling, home ownership program, wheels - to work program.  [employer] free for 60 and older at various locations from [employer], Monday-Friday:  [employer], [address] [place], [phone] -[facility], [address] [place] [phone]  -[facility], [address], [place] [phone]  -[address], [place], [phone]  Agency Name: [facility] Address: [phone] [address], [place], [place] [address] Phone: [phone] Website: [email or web address] Service(s) Offered: Home delivered hot, frozen, and emergency  meals. Grocery assistance program which matches  volunteers one-on-one with seniors unable to grocery shop  for themselves. Must be  60 years and older; less than 20  hours of in-home aide service, limited or no driving ability;  live alone or with someone with a disability; live in  [place].  Agency Name: [employer] [facility] [employer]) Address: [address], [place], [place] [address] Phone: [phone] Service(s) Offered: Food is served to shut-ins, homeless, elderly, and low income people in the community every Saturday (11:30 am-12:30 pm) and Sunday (12:30 pm-1:30pm). Volunteers also offer help and encouragement in seeking employment,  and spiritual guidance.  Agency Name: [employer] Address: [address] [place], [place] [address] Phone: [phone] Service(s) Offered: Child support services; child welfare services; food stamps; Medicaid; work first family assistance; and aid with fuel,  rent, food and medicine.  Agency Name: [employer]itian Address: [address], [place], [place] Phone: [phone] Website: [email or web address] Services Offered: Monday 10:00am-12:00, 8:00pm-9:00pm, and Friday 10:00am-12:00.  Agency Name: [employer] Address: [address], [place], [place] [address] Phone: [phone] Website: [email or web address] Service(s) Offered: Serves weekday meals, open from 11:30 am- 1:00 pm., and 6:30-7:30pm, Monday-Wednesday-Friday distributes food 3:30-6pm, Monday-Wednesday-Friday.  Agency Name: [facility] Address: [address], [place], [place] Phone: [phone] Website: [email or web address] Services Offered: Distributes food the 4th Saturday of the month, starting at 8:00 am  Agency Name: [facility] Address: [phone], [place], [place] [address] Phone: [phone] Website: [email or web address] Service(s) Offered: Bread of life, weekly food pantry. Open Wednesdays from 10:00am-noon.  Agency Name: [facility] [employer] Address: [address] [place], [place], [place] Phone: [phone] Services Offered: Distributes food 9am-1pm, Monday-Thursday. Call for details.  Agency Name: [facility] Address: [address]  [place], [place] [address] Phone: [phone] Website: [email or web address] Service(s) Offered: Games developer. Call for assistance.  Agency Name: [employer] [facility] Address: [address], [place], [place] [address] Phone: [phone] Service Offered: Emergency Food Pantry. Call for appointment.  Agency Name: [facility] Address: [address] [place], [place] [address] Phone: [phone] Website: [email or web address] Services Offered: Games developer. Call for details  Agency Name: [facility] Address: [address] [place], [place] Phone: [phone] Website: [email or web address] Service(s) Offered: Emergency Food Pantry. Call for details.  Agency Name: Holiday representative Address: [address], [place], [place] [address] Phone: [phone] or [phone] Website: [email or web address] Service(s) Offered: Distribute food 9am-11:30 am, Tuesday-Friday, and 1-3:30pm, Monday-Friday. Food pantry Monday-Friday 1pm-3pm, fresh items, Mon.-Wed.-Fri.  Agency Name: [facility] Address: [address] [place], [place] [address] Phone: [phone] Website: www.safealamance.org Services Offered: Distribute food Tues and Sats from 9:00am-noon. Closed 1st Saturday of each month. Call for details  Agency Name: [employer] Address: Adrianne Horn [facility] [address] [place] [address] Phone: [phone]  Services Offered: Delivers meals every Thursday

## 2024-01-18 NOTE — Progress Notes (Signed)
 Occupational Therapy Treatment Patient Details Name: Noah Velasquez MRN: [id number] DOB: 1966-03-16 Today's Date: 01/18/2024   History of present illness Pt is a 58 yo male that presented to ED for SOB, workup for acute on chronic CHF, acute COPD exacerbation. PMH of COPD, HTN, DM, chronic pancreatitis, etoh, PTSD.   OT comments  Pt seen for brief OT tx focused on building activity tolerance through ADL. Pt coughing up phlegm into a cup, noted to have gotten his paper scrub top soiled. Pt completed bed mobility without difficulty and provided with new top. No assist required for seated UB dressing and grooming. On 2L O2, pt declines to wean, noting he needs the O2. Pt progressing towards goals, continues to benefit from skilled OT services.       If plan is discharge home, recommend the following:  A little help with walking and/or transfers;A little help with bathing/dressing/bathroom;Assistance with cooking/housework;Assist for transportation   Equipment Recommendations  None recommended by OT    Recommendations for Other Services      Precautions / Restrictions Precautions Precautions: Fall Restrictions Weight Bearing Restrictions Per Provider Order: No       Mobility Bed Mobility Overal bed mobility: Independent Bed Mobility: Supine to Sit, Sit to Supine           General bed mobility comments: no difficulty to complete    Transfers                   General transfer comment: declined     Balance Overall balance assessment: Needs assistance Sitting-balance support: Feet supported, No upper extremity supported Sitting balance-Leahy Scale: Normal                                     ADL either performed or assessed with clinical judgement   ADL Overall ADL's : Needs assistance/impaired     Grooming: Modified independent;Sitting           Upper Body Dressing : Modified independent;Sitting                           Extremity/Trunk Assessment              Vision       Perception     Praxis     Communication     Cognition Arousal: Alert Behavior During Therapy: WFL for tasks assessed/performed Cognition: No apparent impairments                                        Cueing      Exercises      Shoulder Instructions       General Comments      Pertinent Vitals/ Pain       Pain Assessment Pain Assessment: No/denies pain  Home Living                                          Prior Functioning/Environment              Frequency  Min 2X/week        Progress Toward Goals  OT Goals(current goals can now be found in the care plan section)  Progress towards OT goals: Progressing toward goals  Acute Rehab OT Goals Patient Stated Goal: pt would like to return home and be as independent as possible OT Goal Formulation: With patient Time For Goal Achievement: 01/29/24 Potential to Achieve Goals: Good  Plan      Co-evaluation                 AM-PAC OT "6 Clicks" Daily Activity     Outcome Measure   Help from another person eating meals?: None Help from another person taking care of personal grooming?: None Help from another person toileting, which includes using toliet, bedpan, or urinal?: A Little Help from another person bathing (including washing, rinsing, drying)?: A Little Help from another person to put on and taking off regular upper body clothing?: None Help from another person to put on and taking off regular lower body clothing?: A Little 6 Click Score: 21    End of Session Equipment Utilized During Treatment: Oxygen  OT Visit Diagnosis: Unsteadiness on feet (R26.81);Repeated falls (R29.6);Muscle weakness (generalized) (M62.81);History of falling (Z91.81)   Activity Tolerance Patient tolerated treatment well   Patient Left in bed;with call bell/phone within reach   Nurse Communication Other (comment) (NT  - shaving cream, razor, diet ginger ale)        Time: 1610-9604 OT Time Calculation (min): 8 min  Charges: OT General Charges $OT Visit: 1 Visit OT Treatments $Self Care/Home Management : 8-22 mins  Berenda Breaker., MPH, MS, OTR/L ascom [phone] 01/18/24, 10:09 AM

## 2024-01-18 NOTE — TOC Transition Note (Addendum)
 Transition of Care [facility]) - Discharge Note   Patient Details  Name: Noah Velasquez MRN: [id number] Date of Birth: 02-14-1966  Transition of Care [facility]) CM/SW Contact:  Loman Risk, RN Phone Number: 01/18/2024, 11:07 AM   Clinical Narrative:     Patient to discharge today.  Patient informed RN that he has a ride available at 3pm Portable O2 has already been delivered to room Due to patient's insurance unable to secure home health services, patient aware  Per SDOH food resources added to AVS        Patient Goals and CMS Choice            Discharge Placement                       Discharge Plan and Services Additional resources added to the After Visit Summary for                                       Social Drivers of Health (SDOH) Interventions SDOH Screenings   Food Insecurity: Food Insecurity Present (01/15/2024)  Housing: Low Risk  (01/15/2024)  Transportation Needs: No Transportation Needs (01/15/2024)  Utilities: Not At Risk (01/15/2024)  Alcohol Screen: Low Risk  (04/07/2023)  Depression (PHQ2-9): Low Risk  (04/07/2023)  Tobacco Use: High Risk (01/15/2024)     Readmission Risk Interventions     No data to display

## 2024-01-18 NOTE — Progress Notes (Signed)
 Heart Failure Stewardship Pharmacy Note  PCP: Carollynn Cirri, NP PCP-Cardiologist: None  HPI: Noah Velasquez is a 58 y.o. male with COPD, HTN, IIDM, HLD, chronic pancreatitis, alcohol abuse who presented with wheezing and shortness of breath. On admission, BNP was 317.8, HS-troponin was 26 > 23, UDS positive for cocaine, and TSH was 1.165. Chest x-ray noted pulmonary vascular congestion without airspace edema. 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 Echocardiogram this admission noted LVEFof 50-55%, mild LVH, grade I diastolic dysfunction.  Pertinent Lab Values: Creat  Date Value Ref Range Status  12/07/2023 1.03 0.70 - 1.30 mg/dL Final   Creatinine, Ser  Date Value Ref Range Status  01/17/2024 1.05 0.61 - 1.24 mg/dL Final   BUN  Date Value Ref Range Status  01/17/2024 20 6 - 20 mg/dL Final   Potassium  Date Value Ref Range Status  01/17/2024 3.5 3.5 - 5.1 mmol/L Final   Sodium  Date Value Ref Range Status  01/17/2024 133 (L) 135 - 145 mmol/L Final   B Natriuretic Peptide  Date Value Ref Range Status  01/15/2024 317.8 (H) 0.0 - 100.0 pg/mL Final    Comment:    Performed at [facility], [address] [place], [place] [address]   Magnesium   Date Value Ref Range Status  08/01/2022 2.2 1.7 - 2.4 mg/dL Final    Comment:    Performed at [facility], [address] [place], [place] [address]   Hgb A1c MFr Bld  Date Value Ref Range Status  12/07/2023 6.5 (H) <5.7 % Final    Comment:    For someone without known diabetes, a hemoglobin A1c value of 6.5% or greater indicates that they may have  diabetes and this should be confirmed with a follow-up  test. . For someone with known diabetes, a value <7% indicates  that their diabetes is well controlled and a value  greater than or equal to 7% indicates suboptimal  control. A1c targets should be individualized based on  duration of  diabetes, age, comorbid conditions, and  other considerations. . Currently, no consensus exists regarding use of hemoglobin A1c for diagnosis of diabetes for children. .    TSH  Date Value Ref Range Status  01/15/2024 1.165 0.350 - 4.500 uIU/mL Final    Comment:    Performed by a 3rd Generation assay with a functional sensitivity of <=0.01 uIU/mL. Performed at [facility], [address] [place], [place] [address]   01/10/2020 1.59 0.40 - 4.50 mIU/L Final    Vital Signs:  Temp:  [97.7 F (36.5 C)-98.4 F (36.9 C)] 98.1 F (36.7 C) (06/03 0802) Pulse Rate:  [71-87] 74 (06/03 0802) Resp:  [18] 18 (06/03 0802) BP: (127-154)/(86-96) 154/96 (06/03 0802) SpO2:  [93 %-97 %] 97 % (06/03 0802) Weight:  [76.9 kg (169 lb 8.5 oz)] 76.9 kg (169 lb 8.5 oz) (06/03 0349)  Intake/Output Summary (Last 24 hours) at 01/18/2024 0816 Last data filed at 01/17/2024 2300 Gross per 24 hour  Intake 600 ml  Output --  Net 600 ml   Current Heart Failure Medications:  Loop diuretic: furosemide 20 mg PO daily Beta-Blocker: none ACEI/ARB/ARNI: irbesartan 300 mg daily MRA: none SGLT2i: none Other: amlodipine  10 mg daily  Prior to admission Heart Failure Medications:  Loop diuretic: none Beta-Blocker: none ACEI/ARB/ARNI: olmesartan  40 mg daily MRA: none SGLT2i: none Other: amlodipine  10 mg daily  Assessment: 1. Acute on chronic diastolic heart failure (LVEF 50-55%)  with grade I diastolic dysfunction, due to NICM. NYHA class II-III symptoms.  -Symptoms: Patient reports being not quite back to baseline. Reports persistent mild orthopnea and dyspnea on exertion. Symptoms appear to be more due to chronic lung disease than volume overload. -Volume: Appears to be volume up today. Currently on furosemide 20 mg daily. JVP appears elevated. Could consider adding spironolactone to augment diuresis.  -Hemodynamics: BP is elevated despite multiple antihypertensives. HR 70s. -SGLT2i:Consider adding  Farxiga 10 mg daily as first line therapy for HFpEF. -MRA: Consider adding spironolactone 25 mg daily for CHF, resistant HTN, diuresis, and to maintain K >4. -ARNI: Could consider transition from ARB to Entresto if BP after starting spironolactone. This will help to augment diuresis  Plan: 1) Medication changes recommended at this time: -Consider adding spironolactone 25 mg daily  2) Patient assistance: -Copays for all medications are $4. Patient does not have the funds for medications at this time and is willing to send to [facility] pharmacy for copays to be either waived or added to a charge account.   3) Education: - Patient has been educated on current HF medications and potential additions to HF medication regimen - Patient verbalizes understanding that over the next few months, these medication doses may change and more medications may be added to optimize HF regimen - Patient has been educated on basic disease state pathophysiology and goals of therapy  Medication Assistance / Insurance Benefits Check: Does the patient have prescription insurance?    Type of insurance plan:  Does the patient qualify for medication assistance through manufacturers or grants? No  Outpatient Pharmacy: Prior to admission outpatient pharmacy: [facility]      Please do not hesitate to reach out with questions or concerns,  Bevely Brush, PharmD, CPP, BCPS Heart Failure Pharmacist  Phone - [phone] 01/18/2024 8:16 AM

## 2024-01-18 NOTE — Plan of Care (Signed)
  Problem: Education: Goal: Knowledge of General Education information will improve Description: Including pain rating scale, medication(s)/side effects and non-pharmacologic comfort measures Outcome: Adequate for Discharge   Problem: Health Behavior/Discharge Planning: Goal: Ability to manage health-related needs will improve Outcome: Adequate for Discharge   Problem: Clinical Measurements: Goal: Ability to maintain clinical measurements within normal limits will improve Outcome: Adequate for Discharge Goal: Will remain free from infection Outcome: Adequate for Discharge Goal: Diagnostic test results will improve Outcome: Adequate for Discharge Goal: Respiratory complications will improve Outcome: Adequate for Discharge Goal: Cardiovascular complication will be avoided Outcome: Adequate for Discharge   Problem: Activity: Goal: Risk for activity intolerance will decrease Outcome: Adequate for Discharge   Problem: Nutrition: Goal: Adequate nutrition will be maintained Outcome: Adequate for Discharge   Problem: Coping: Goal: Level of anxiety will decrease Outcome: Adequate for Discharge   Problem: Elimination: Goal: Will not experience complications related to bowel motility Outcome: Adequate for Discharge Goal: Will not experience complications related to urinary retention Outcome: Adequate for Discharge   Problem: Pain Managment: Goal: General experience of comfort will improve and/or be controlled Outcome: Adequate for Discharge   Problem: Safety: Goal: Ability to remain free from injury will improve Outcome: Adequate for Discharge   Problem: Skin Integrity: Goal: Risk for impaired skin integrity will decrease Outcome: Adequate for Discharge   Problem: Education: Goal: Knowledge of disease or condition will improve Outcome: Adequate for Discharge Goal: Knowledge of the prescribed therapeutic regimen will improve Outcome: Adequate for Discharge Goal:  Individualized Educational Video(s) Outcome: Adequate for Discharge   Problem: Activity: Goal: Ability to tolerate increased activity will improve Outcome: Adequate for Discharge Goal: Will verbalize the importance of balancing activity with adequate rest periods Outcome: Adequate for Discharge   Problem: Respiratory: Goal: Ability to maintain a clear airway will improve Outcome: Adequate for Discharge Goal: Levels of oxygenation will improve Outcome: Adequate for Discharge Goal: Ability to maintain adequate ventilation will improve Outcome: Adequate for Discharge   Problem: Education: Goal: Ability to describe self-care measures that may prevent or decrease complications (Diabetes Survival Skills Education) will improve Outcome: Adequate for Discharge Goal: Individualized Educational Video(s) Outcome: Adequate for Discharge   Problem: Coping: Goal: Ability to adjust to condition or change in health will improve Outcome: Adequate for Discharge   Problem: Fluid Volume: Goal: Ability to maintain a balanced intake and output will improve Outcome: Adequate for Discharge   Problem: Health Behavior/Discharge Planning: Goal: Ability to identify and utilize available resources and services will improve Outcome: Adequate for Discharge Goal: Ability to manage health-related needs will improve Outcome: Adequate for Discharge   Problem: Metabolic: Goal: Ability to maintain appropriate glucose levels will improve Outcome: Adequate for Discharge   Problem: Nutritional: Goal: Maintenance of adequate nutrition will improve Outcome: Adequate for Discharge Goal: Progress toward achieving an optimal weight will improve Outcome: Adequate for Discharge   Problem: Skin Integrity: Goal: Risk for impaired skin integrity will decrease Outcome: Adequate for Discharge   Problem: Tissue Perfusion: Goal: Adequacy of tissue perfusion will improve Outcome: Adequate for Discharge

## 2024-01-18 NOTE — Progress Notes (Incomplete)
 PROGRESS NOTE  Noah Velasquez    DOB: 1965/12/27, 58 y.o.  ZOX:[id number]    Code Status: Full Code   DOA: 01/14/2024   LOS: 3   Brief hospital course  Noah Velasquez is a 58 y.o. male with medical history significant of COPD, HTN, IIDM, HLD, chronic pancreatitis, alcohol abuse, wheezing shortness of breath.   Chest x-ray showed pulmonary congestion left lung nodule. Started on treatment for COPD exacerbation and CHF workup.  Follow up chest CT with concerning findings consistent with 3.7 x 1.8 cm lobulated mass in the left upper lobe abutting the chest wall, suspicious for primary bronchogenic carcinoma, with adjacent satellite nodularity. 2. 14 mm short axis right paratracheal node and a 14 mm short axis prevascular node, suspicious for thoracic nodal metastases.  01/18/24 -patient informed of CT findings/indication. Established care with heme/onc. Brain MRI to assess for mets. Pulmonology consulted to pursue bronchoscopy and Bx which will be done outpatient.   Assessment & Plan  Principal Problem:   COPD exacerbation (HCC) Active Problems:   Acute on chronic diastolic CHF (congestive heart failure) (HCC)   CHF (congestive heart failure) (HCC)   Pulmonary vascular congestion   Hypoxia  Acute hypoxic respiratory- multifactorial. Currently on 2L [place] and comfortable while resting. Echo EF 50-55% with LVH, G1dd - continue COPD treatments and gentle diuresis. 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  - heme/onc consulted to establish care  - brain MRI [facility] ordered for met evaluation   - outpatient PET scan planned after brinch - pulmonology consulted-   - planning bronchoscopy outpatient 6/9 - continue to attempt to wean patient to room air. If not able to, can dc home with 2L O2 tomorrow.     Hypertension -- continue home meds   Type II diabetes, hyperglycemia -- continue metformin    History of PTSD/depression -- continue Seroquel   Polysubstance abuse/smoking -- urine drug screen positive for cocaine-- advised to abstain  Body mass index is 26.55 kg/m.  VTE ppx: enoxaparin  (LOVENOX ) injection 40 mg Start: 01/15/24 1000  Diet:     Diet   Diet regular Room service appropriate? Yes; Fluid consistency: Thin   Consultants: Pulmonology Heme/onc  Subjective 01/18/24    Pt reports feeling no SOB at rest, no current chest pain. States that laying on his left side exacerbates a cough. Provided patient with CT chest scan results and what findings likely indicate as well as tentative plan going forward. He was understandably in a stage of shock at the news and asked for time to process.    Objective   Blood pressure (!) 152/99, pulse 71, temperature 97.8 F (36.6 C), temperature source Oral, resp. rate 16, height 5\' 7"  (1.702 m), weight 76.7 kg, SpO2 100%.  Intake/Output Summary (Last 24 hours) at 01/18/2024 0736 Last data filed at 01/17/2024 2300 Gross per 24 hour  Intake 600 ml  Output --  Net 600 ml   Filed Weights   01/15/24 1304 01/17/24 0500 01/18/24 0349  Weight: 75.5 kg 76.7 kg 76.9 kg    Physical Exam:  General: awake, alert, NAD HEENT: atraumatic, clear conjunctiva, anicteric sclera, MMM, hearing grossly normal Respiratory: normal respiratory effort. Cardiovascular: quick capillary refill, normal S1/S2, RRR, no JVD, murmurs Nervous: A&O x3. no gross focal neurologic deficits, normal speech Extremities: moves all equally, no edema, normal tone Skin: dry,  intact, normal temperature, normal color. No rashes, lesions or ulcers on exposed skin Psychiatry: normal mood under circumstance, congruent affect  Labs   I have personally reviewed the following labs and imaging studies CBC    Component Value Date/Time   WBC 10.7 (H) 01/16/2024 0702   RBC 4.43  01/16/2024 0702   HGB 14.4 01/16/2024 0702   HCT 41.2 01/16/2024 0702   PLT 267 01/16/2024 0702   MCV 93.0 01/16/2024 0702   MCH 32.5 01/16/2024 0702   MCHC 35.0 01/16/2024 0702   RDW 13.3 01/16/2024 0702   LYMPHSABS 2.4 01/15/2024 0013   MONOABS 0.5 01/15/2024 0013   EOSABS 0.4 01/15/2024 0013   BASOSABS 0.1 01/15/2024 0013      Latest Ref Rng & Units 01/17/2024   10:43 AM 01/16/2024    7:02 AM 01/15/2024    2:11 AM  BMP  Glucose 70 - 99 mg/dL 454  098  119   BUN 6 - 20 mg/dL 20  20  11    Creatinine 0.61 - 1.24 mg/dL 1.47  8.29  5.62   Sodium 135 - 145 mmol/L 133  130  131   Potassium 3.5 - 5.1 mmol/L 3.5  3.8  3.9   Chloride 98 - 111 mmol/L 97  99  101   CO2 22 - 32 mmol/L 25  21  21    Calcium  8.9 - 10.3 mg/dL 8.6  8.7  8.7     MR BRAIN W WO CONTRAST Result Date: 01/17/2024 CLINICAL DATA:  evaluate for metastasis with primary lung neoplasm on ct scan EXAM: MRI HEAD WITHOUT AND WITH CONTRAST TECHNIQUE: Multiplanar, multiecho pulse sequences of the brain and surrounding structures were obtained without and with intravenous contrast. CONTRAST:  7mL GADAVIST  GADOBUTROL  1 MMOL/ML IV SOLN COMPARISON:  None Available. FINDINGS: Brain: No acute infarction, hemorrhage, hydrocephalus, extra-axial collection or mass lesion. Remote lacunar infarcts in the cerebellum bilaterally as well as in the right parietal cortex. Additional scattered T2/FLAIR hyperintensities the white matter are compatible with chronic microvascular ischemic disease. No pathologic enhancement. Vascular: Major arterial flow voids are maintained at the skull base. Skull and upper cervical spine: Normal marrow signal. Sinuses/Orbits: Mild paranasal sinus mucosal thickening. No acute orbital findings. Other: No mastoid effusions. IMPRESSION: 1. No evidence of acute intracranial abnormality or metastatic disease. 2. 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 Electronically Signed   By: Stevenson Elbe M.D.   On:  01/17/2024 19:45   CT CHEST WO CONTRAST Result Date: 01/16/2024 EXAM: CT CHEST WITHOUT CONTRAST 01/16/2024 04:33:00 PM TECHNIQUE: CT of the chest was performed without the administration of intravenous contrast. Multiplanar reformatted images are provided for review. Automated exposure control, iterative reconstruction, and/or weight based adjustment of the mA/kV was utilized to reduce the radiation dose to as low as reasonably achievable. COMPARISON: Chest radiograph earlier today. CLINICAL HISTORY: Abnormal xray - lung nodule, >= 1 cm. FINDINGS: MEDIASTINUM: Heart and pericardium are unremarkable. The central airways are clear. LYMPH NODES: 14 mm short axis right paratracheal node (image 49) and a 14 mm short axis prevascular node (image 52), suspicious for thoracic nodal metastases. LUNGS AND PLEURA: 3.7 x 1.8 cm lobulated mass in the left upper lobe abutting the chest wall (image 52), suspicious for primary bronchogenic carcinoma. Adjacent satellite nodularity in the left upper lobe. Moderate centrilobular and paraseptal emphysematous changes, upper lung predominant. Lower lobe predominant subpleural reticulation/fibrosis, favoring superimposed chronic lung disease. No pleural effusion or pneumothorax. SOFT TISSUES/BONES: Mild degenerative changes of the lower thoracic  spine. No acute abnormality of the soft tissues. UPPER ABDOMEN: Diffuse parenchymal calcifications along the pancreatic body and tail, reflecting sequela of prior or chronic pancreatitis. IMPRESSION: 1. 3.7 x 1.8 cm lobulated mass in the left upper lobe abutting the chest wall, suspicious for primary bronchogenic carcinoma, with adjacent satellite nodularity. 2. 14 mm short axis right paratracheal node and a 14 mm short axis prevascular node, suspicious for thoracic nodal metastases. Electronically signed by: Zadie Herter MD 01/16/2024 07:54 PM EDT RP Workstation: [id number]    Disposition Plan & Communication  Patient status: Inpatient   Admitted From: Home Planned disposition location: Home Anticipated discharge date: 6/3 pending wean from O2  Family Communication: patient asked that he can inform his family members himself    Author: Ree Candy, DO [facility] 01/18/2024, 7:36 AM   Available by Epic secure chat 7AM-7PM. If 7PM-7AM, please contact night-coverage.  [facility] contact information found on [email or web address].

## 2024-01-18 NOTE — Discharge Summary (Incomplete)
 Physician Discharge Summary  Patient: Noah Velasquez ZOX:[id number] DOB: 02-Apr-1966   Code Status: Full Code Admit date: 01/14/2024 Discharge date: 01/18/2024 Disposition: Home, PT, O2 PCP: Carollynn Cirri, NP  Recommendations for Outpatient Follow-up:  Follow up with PCP within 1-2 weeks Regarding general hospital follow up and preventative care Recommend  ***  Discharge Diagnoses:  Principal Problem:   COPD exacerbation (HCC) Active Problems:   Acute on chronic diastolic CHF (congestive heart failure) (HCC)   CHF (congestive heart failure) (HCC)   Pulmonary vascular congestion   Hypoxia   Bronchiolo-alveolar adenocarcinoma of left lung [facility])  Brief Hospital Course Summary: Pt ***  All other chronic conditions were treated with home medications.    Discharge Condition: {DISCHARGE CONDITION:19696}, improved Recommended discharge diet: {Discharge WJXB:[id number]}  Consultations: ***  Procedures/Studies: ***   Allergies as of 01/18/2024       Reactions   Hctz [hydrochlorothiazide] Swelling        Medication List     TAKE these medications    amLODipine -olmesartan  10-40 MG tablet Commonly known as: AZOR  Take 1 tablet by mouth daily.   amoxicillin -clavulanate 875-125 MG tablet Commonly known as: AUGMENTIN  Take 1 tablet by mouth every 12 (twelve) hours for 5 days.   aspirin  EC 81 MG tablet Take 1 tablet (81 mg total) by mouth daily. Swallow whole.   budesonide -formoterol  160-4.5 MCG/ACT inhaler Commonly known as: SYMBICORT  Inhale 2 puffs into the lungs 2 (two) times daily.   Compressor/Nebulizer Misc Nebulizer and tubes/mask to use as directed with DuoNeb as needed   HYDROcodone -acetaminophen  7.5-325 MG tablet Commonly known as: NORCO Take 1 tablet by mouth every 6 (six) hours as needed for up to 3 days for moderate pain (pain score 4-6).   lovastatin  10 MG tablet Commonly known as: MEVACOR  Take 1 tablet (10 mg total) by mouth at bedtime.    metFORMIN  500 MG tablet Commonly known as: GLUCOPHAGE  Take 1 tablet (500 mg total) by mouth daily with breakfast.   nicotine  21 mg/24hr patch Commonly known as: NICODERM CQ  - dosed in mg/24 hours Place 1 patch (21 mg total) onto the skin daily.   omeprazole  20 MG capsule Commonly known as: PRILOSEC Take 1 capsule (20 mg total) by mouth daily.   sertraline  100 MG tablet Commonly known as: ZOLOFT  TAKE 1 TABLET BY MOUTH ONCE DAILY   traZODone  150 MG tablet Commonly known as: DESYREL  Take 1 tablet (150 mg total) by mouth at bedtime as needed for sleep.   Ventolin  HFA 108 (90 Base) MCG/ACT inhaler Generic drug: albuterol  Inhale 1-2 puffs into the lungs every 6 (six) hours as needed.   Zenpep  15000-47000 units Cpep Generic drug: Pancrelipase  (Lip-Prot-Amyl) Take 1 capsule (15,000 Units total) by mouth 3 (three) times daily.               Durable Medical Equipment  (From admission, onward)           Start     Ordered   01/16/24 1548  For home use only DME oxygen  Once       Question Answer Comment  Length of Need Lifetime   Mode or (Route) Mask   Liters per Minute 2   Frequency Continuous (stationary and portable oxygen unit needed)   Oxygen conserving device Yes   Oxygen delivery system Gas      01/16/24 1548            Follow-up Information     [facility]. Go on 01/25/2024.   Specialty: Cardiology Why: Hospital Follow-Up 01/25/2024 Please bring all your medications to follow-up apppointment [facility], [address] Floor, [address] Free Valet Parking at the [employer] information: [address] [place]   [address] [phone]        Avonne Boettcher, MD. Schedule an appointment as soon as possible for a visit in 2 week(s).   Specialty: Oncology Contact information: [address] [place] [address] [phone]         Cleve Dale, MD. Go on  01/24/2024.   Specialties: Pulmonary Disease, Cardiology Contact information: [address] [place] [address] [phone]                 Subjective   Pt reports ***  All questions and concerns were addressed at time of discharge.  Objective  Blood pressure (!) 154/96, pulse 74, temperature 98.1 F (36.7 C), temperature source Oral, resp. rate 18, height 5\' 7"  (1.702 m), weight 76.9 kg, SpO2 97%.   General: Pt is alert, awake, not in acute distress Cardiovascular: RRR, S1/S2 +, no rubs, no gallops Respiratory: CTA bilaterally, no wheezing, no rhonchi Abdominal: Soft, NT, ND, bowel sounds + Extremities: no edema, no cyanosis  The results of significant diagnostics from this hospitalization (including imaging, microbiology, ancillary and laboratory) are listed below for reference.   Imaging studies: MR BRAIN W WO CONTRAST Result Date: 01/17/2024 CLINICAL DATA:  evaluate for metastasis with primary lung neoplasm on ct scan EXAM: MRI HEAD WITHOUT AND WITH CONTRAST TECHNIQUE: Multiplanar, multiecho pulse sequences of the brain and surrounding structures were obtained without and with intravenous contrast. CONTRAST:  7mL GADAVIST  GADOBUTROL  1 MMOL/ML IV SOLN COMPARISON:  None Available. FINDINGS: Brain: No acute infarction, hemorrhage, hydrocephalus, extra-axial collection or mass lesion. Remote lacunar infarcts in the cerebellum bilaterally as well as in the right parietal cortex. Additional scattered T2/FLAIR hyperintensities the white matter are compatible with chronic microvascular ischemic disease. No pathologic enhancement. Vascular: Major arterial flow voids are maintained at the skull base. Skull and upper cervical spine: Normal marrow signal. Sinuses/Orbits: Mild paranasal sinus mucosal thickening. No acute orbital findings. Other: No mastoid effusions. IMPRESSION: 1. No evidence of acute intracranial abnormality or metastatic disease. 2. 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 Electronically Signed   By: Stevenson Elbe M.D.   On: 01/17/2024 19:45   CT CHEST WO CONTRAST Result Date: 01/16/2024 EXAM: CT CHEST WITHOUT CONTRAST 01/16/2024 04:33:00 PM TECHNIQUE: CT of the chest was performed without the administration of intravenous contrast. Multiplanar reformatted images are provided for review. Automated exposure control, iterative reconstruction, and/or weight based adjustment of the mA/kV was utilized to reduce the radiation dose to as low as reasonably achievable. COMPARISON: Chest radiograph earlier today. CLINICAL HISTORY: Abnormal xray - lung nodule, >= 1 cm. FINDINGS: MEDIASTINUM: Heart and pericardium are unremarkable. The central airways are clear. LYMPH NODES: 14 mm short axis right paratracheal node (image 49) and a 14 mm short axis prevascular node (image 52), suspicious for thoracic nodal metastases. LUNGS AND PLEURA: 3.7 x 1.8 cm lobulated mass in the left upper lobe abutting the chest wall (image 52), suspicious for primary bronchogenic carcinoma. Adjacent satellite nodularity in the left upper lobe. Moderate centrilobular and paraseptal emphysematous changes, upper lung predominant. Lower lobe predominant subpleural reticulation/fibrosis, favoring superimposed chronic lung disease. No pleural effusion or pneumothorax. SOFT TISSUES/BONES: Mild degenerative changes of the lower thoracic spine. No acute abnormality of the soft  tissues. UPPER ABDOMEN: Diffuse parenchymal calcifications along the pancreatic body and tail, reflecting sequela of prior or chronic pancreatitis. IMPRESSION: 1. 3.7 x 1.8 cm lobulated mass in the left upper lobe abutting the chest wall, suspicious for primary bronchogenic carcinoma, with adjacent satellite nodularity. 2. 14 mm short axis right paratracheal node and a 14 mm short axis prevascular node, suspicious for thoracic nodal metastases. Electronically signed by: Zadie Herter MD 01/16/2024 07:54 PM EDT RP  Workstation: [id number]   DG Chest 1 View Result Date: 01/16/2024 CLINICAL DATA:  CHF. EXAM: CHEST  1 VIEW COMPARISON:  01/15/2024 FINDINGS: Cardiopericardial silhouette is at upper limits of normal for size. There is pulmonary vascular congestion without overt pulmonary edema. Nodular density projects over the lateral left mid lung, potentially obscured by the cardiac lead on the prior study. No acute bony abnormality. Telemetry leads overlie the chest. IMPRESSION: 1. Nodular density projects over the lateral left mid lung. CT chest without contrast recommended to further evaluate. 2. Pulmonary vascular congestion without overt airspace edema. Electronically Signed   By: Donnal Fusi M.D.   On: 01/16/2024 09:14   ECHOCARDIOGRAM COMPLETE Result Date: 01/15/2024    ECHOCARDIOGRAM REPORT   Patient Name:   Noah WILLIAMSEN Date of Exam: 01/15/2024 Medical Rec #:  [id number]     Height:       67.0 in Accession #:    [id number]    Weight:       174.0 lb Date of Birth:  1965/08/27    BSA:          1.906 m Patient Age:    57 years      BP:           150/100 mmHg Patient Gender: M             HR:           95 bpm. Exam Location:  [facility] Procedure: 2D Echo, Cardiac Doppler and Color Doppler (Both Spectral and Color            Flow Doppler were utilized during procedure). Indications:     CHF I50.31  History:         Patient has no prior history of Echocardiogram examinations.  Sonographer:     Clenton Czech RDCS, FASE Referring Phys:  [id number] [place] Diagnosing Phys: Antonette Batters MD IMPRESSIONS  1. Left ventricular ejection fraction, by estimation, is 50 to 55%. The left ventricle has normal function. The left ventricle has no regional wall motion abnormalities. There is mild left ventricular hypertrophy. Left ventricular diastolic parameters are consistent with Grade I diastolic dysfunction (impaired relaxation).  2. Right ventricular systolic function is normal. The right ventricular size is normal.  3. The  mitral valve is normal in structure. Trivial mitral valve regurgitation.  4. The aortic valve is normal in structure. Aortic valve regurgitation is not visualized. Aortic valve sclerosis is present, with no evidence of aortic valve stenosis. FINDINGS  Left Ventricle: Left ventricular ejection fraction, by estimation, is 50 to 55%. The left ventricle has normal function. The left ventricle has no regional wall motion abnormalities. Strain was performed and the global longitudinal strain is indeterminate. Global longitudinal strain performed but not reported based on interpreter judgement due to suboptimal tracking. The left ventricular internal cavity size was normal in size. There is mild left ventricular hypertrophy. Left ventricular diastolic parameters are consistent with Grade I diastolic dysfunction (impaired relaxation). Right Ventricle: The right ventricular size is normal. No increase in  right ventricular wall thickness. Right ventricular systolic function is normal. Left Atrium: Left atrial size was normal in size. Right Atrium: Right atrial size was normal in size. Pericardium: There is no evidence of pericardial effusion. Mitral Valve: The mitral valve is normal in structure. Trivial mitral valve regurgitation. Tricuspid Valve: The tricuspid valve is normal in structure. Tricuspid valve regurgitation is mild. Aortic Valve: The aortic valve is normal in structure. Aortic valve regurgitation is not visualized. Aortic regurgitation PHT measures 750 msec. Aortic valve sclerosis is present, with no evidence of aortic valve stenosis. Aortic valve mean gradient measures 8.0 mmHg. Aortic valve peak gradient measures 14.7 mmHg. Aortic valve area, by VTI measures 2.40 cm. Pulmonic Valve: The pulmonic valve was normal in structure. Pulmonic valve regurgitation is not visualized. Aorta: The ascending aorta was not well visualized. IAS/Shunts: No atrial level shunt detected by color flow Doppler. Additional Comments:  3D was performed not requiring image post processing on an independent workstation and was indeterminate.  LEFT VENTRICLE PLAX 2D LVIDd:         4.90 cm     Diastology LVIDs:         3.50 cm     LV e' medial:    6.53 cm/s LV PW:         1.30 cm     LV E/e' medial:  13.2 LV IVS:        1.20 cm     LV e' lateral:   11.90 cm/s LVOT diam:     2.20 cm     LV E/e' lateral: 7.3 LV SV:         80 LV SV Index:   42 LVOT Area:     3.80 cm  LV Volumes (MOD) LV vol d, MOD A4C: 87.7 ml LV vol s, MOD A4C: 36.1 ml LV SV MOD A4C:     87.7 ml RIGHT VENTRICLE RV Basal diam:  3.20 cm RV S prime:     12.20 cm/s TAPSE (M-mode): 2.0 cm LEFT ATRIUM             Index        RIGHT ATRIUM           Index LA diam:        3.50 cm 1.84 cm/m   RA Area:     16.40 cm LA Vol (A2C):   63.5 ml 33.32 ml/m  RA Volume:   44.90 ml  23.56 ml/m LA Vol (A4C):   51.0 ml 26.76 ml/m LA Biplane Vol: 57.7 ml 30.27 ml/m  AORTIC VALVE                     PULMONIC VALVE AV Area (Vmax):    2.38 cm      PV Vmax:       1.25 m/s AV Area (Vmean):   2.30 cm      PV Peak grad:  6.2 mmHg AV Area (VTI):     2.40 cm AV Vmax:           191.50 cm/s AV Vmean:          128.000 cm/s AV VTI:            0.334 m AV Peak Grad:      14.7 mmHg AV Mean Grad:      8.0 mmHg LVOT Vmax:         120.00 cm/s LVOT Vmean:        77.400 cm/s LVOT  VTI:          0.211 m LVOT/AV VTI ratio: 0.63 AI PHT:            750 msec  AORTA Ao Root diam: 3.80 cm MITRAL VALVE                TRICUSPID VALVE MV Area (PHT): 4.96 cm     TR Peak grad:   36.2 mmHg MV Decel Time: 153 msec     TR Vmax:        301.00 cm/s MV E velocity: 86.50 cm/s MV A velocity: 101.00 cm/s  SHUNTS MV E/A ratio:  0.86         Systemic VTI:  0.21 m                             Systemic Diam: 2.20 cm Antonette Batters MD Electronically signed by Antonette Batters MD Signature Date/Time: 01/15/2024/10:40:32 AM    Final    DG Chest Port 1 View Result Date: 01/15/2024 CLINICAL DATA:  Shortness of breath. History of COPD with  inhaler. Decreased oxygen saturation. EXAM: PORTABLE CHEST 1 VIEW COMPARISON:  07/31/2022 FINDINGS: Borderline heart size with mild pulmonary vascular congestion. Diffuse interstitial pattern to the lungs likely representing interstitial edema. Peribronchial thickening consistent with chronic bronchitis. No focal consolidation. No pleural effusion or pneumothorax. Mediastinal contours appear intact. IMPRESSION: Pulmonary vascular congestion with diffuse interstitial pattern to the lungs likely edema. Electronically Signed   By: Boyce Byes M.D.   On: 01/15/2024 00:15    Labs: Basic Metabolic Panel: Recent Labs  Lab 01/15/24 0211 01/16/24 0702 01/17/24 1043  NA 131* 130* 133*  K 3.9 3.8 3.5  CL 101 99 97*  CO2 21* 21* 25  GLUCOSE 177* 176* 172*  BUN 11 20 20   CREATININE 0.97 0.94 1.05  CALCIUM  8.7* 8.7* 8.6*   CBC: Recent Labs  Lab 01/15/24 0013 01/16/24 0702  WBC 7.9 10.7*  NEUTROABS 4.5  --   HGB 14.2 14.4  HCT 41.4 41.2  MCV 91.8 93.0  PLT 271 267   Microbiology: Results for orders placed or performed during the hospital encounter of 01/14/24  Expectorated Sputum Assessment w Gram Stain, Rflx to Resp Cult     Status: None   Collection Time: 01/15/24 11:39 AM   Specimen: Sputum  Result Value Ref Range Status   Specimen Description SPUTUM  Final   Special Requests NONE  Final   Sputum evaluation   Final    Sputum specimen not acceptable for testing.  Please recollect.   CALLED TO SUSANA PATION @ 1443 ON 01/15/2024 BY CAF Performed at [facility], [address] [place] [place], [place] [address]    Report Status 01/15/2024 FINAL  Final    Time coordinating discharge: Over 30 minutes  Ree Candy, MD  [facility] 01/18/2024, 11:01 AM

## 2024-01-19 NOTE — Progress Notes (Signed)
 Referral received during hospital visit for pt to be seen for follow up after discharge by Dr. Randy Buttery with PET scan prior. Orders placed. Pt scheduled for PET on 6/11 at 8am and pt will see Dr. Randy Buttery on 6/16 at 10:45am. Referral coordinator will confirm new pt visit with pt next week. Attempted to contact pt to review appts but pt did not answer. Message left for pt to call back. Awaiting call back.

## 2024-01-20 ENCOUNTER — Telehealth: Payer: Self-pay

## 2024-01-20 ENCOUNTER — Encounter: Payer: Self-pay | Admitting: *Deleted

## 2024-01-20 NOTE — Telephone Encounter (Signed)
 For the codes 13086, [phone], [phone] Prior Auth Not Required Refer # [id number] [facility]

## 2024-01-20 NOTE — Telephone Encounter (Signed)
 Noted. Nothing further needed.

## 2024-01-20 NOTE — Progress Notes (Signed)
 Contact pt today to follow up with him regarding scheduled appt for PET scan on 6/11 and tentative appt with Dr. Randy Buttery on 6/16 at 10:45am. Pt stated that he cannot do any more appts next week due to transportation. Stated that he will discuss with his transportation and let me know which days he can scheduled his appt for PET scan. Will follow up again next week regarding scheduling appts. Pt verbalized understanding. PET for 6/11 has been cancelled and will reschedule after discussing with patient.

## 2024-01-20 NOTE — Telephone Encounter (Signed)
 Robotic Bronch with EBUS 01/24/2024 at 10:00am Lung nodule [id number], [id number], [id number]  Noah Velasquez please see Bronch info.  Patient is aware of date and time.

## 2024-01-21 ENCOUNTER — Encounter
Admission: RE | Admit: 2024-01-21 | Discharge: 2024-01-21 | Disposition: A | Discharge status: Home / Self Care | Source: Ambulatory Visit | Attending: Internal Medicine | Admitting: Internal Medicine

## 2024-01-21 ENCOUNTER — Other Ambulatory Visit: Payer: Self-pay

## 2024-01-21 DIAGNOSIS — Z01818 Encounter for other preprocedural examination: Secondary | ICD-10-CM

## 2024-01-21 HISTORY — DX: Type 2 diabetes mellitus without complications: E11.9

## 2024-01-21 HISTORY — DX: Heart failure, unspecified: I50.9

## 2024-01-21 HISTORY — DX: Cocaine use, unspecified, uncomplicated: F14.90

## 2024-01-21 HISTORY — DX: Chronic obstructive pulmonary disease, unspecified: J44.9

## 2024-01-21 HISTORY — DX: Pneumonia, unspecified organism: J18.9

## 2024-01-21 HISTORY — DX: Cannabis use, unspecified, uncomplicated: F12.90

## 2024-01-21 HISTORY — DX: Nicotine dependence, cigarettes, uncomplicated: F17.210

## 2024-01-21 HISTORY — DX: Interstitial pulmonary disease, unspecified: J84.9

## 2024-01-21 HISTORY — DX: Alcohol use, unspecified, uncomplicated: F10.90

## 2024-01-21 HISTORY — DX: Unspecified convulsions: R56.9

## 2024-01-21 HISTORY — DX: Malignant neoplasm of unspecified part of left bronchus or lung: C34.92

## 2024-01-21 HISTORY — DX: Atherosclerosis of aorta: I70.0

## 2024-01-21 HISTORY — DX: Depression, unspecified: F32.A

## 2024-01-21 HISTORY — DX: Dyspnea, unspecified: R06.00

## 2024-01-21 HISTORY — DX: Male erectile dysfunction, unspecified: N52.9

## 2024-01-21 NOTE — Progress Notes (Addendum)
 Pt does not have my chart-emailed surgery instructions to pt at [email or web address]

## 2024-01-21 NOTE — Progress Notes (Signed)
 Pt states during anesthesia interview that he will be taking transportation [place] home after his surgery without anyone else with him. I instructed pt that he has to have someone with him besides the driver in the transportation vehicle (friend, family member) and that if he does not, then his surgery will be cancelled. I informed pt that if this is going to be a problem he will need to reach out to Dr Auston Left' s office and inform them of this.Pt verbalized understanding

## 2024-01-21 NOTE — Patient Instructions (Signed)
 Your procedure is scheduled on:01-24-24 Monday Report to the Registration Desk on the 1st floor of the Medical [facility].Then proceed to the 2nd floor Surgery Desk To find out your arrival time, please call [phone] between 1PM - 3PM on:01-21-24 Friday If your arrival time is 6:00 am, do not arrive before that time as the [facility] entrance doors do not open until 6:00 am.  REMEMBER: Instructions that are not followed completely may result in serious medical risk, up to and including death; or upon the discretion of your surgeon and anesthesiologist your surgery may need to be rescheduled.  Do not eat food OR drink any liquids after midnight the night before surgery.  No gum chewing or hard candies.  One week prior to surgery:Stop NOW (01-21-24) Stop Anti-inflammatories (NSAIDS) such as Advil, Aleve , Ibuprofen, Motrin, Naproxen , Naprosyn  and Aspirin  based products such as Excedrin, Goody's Powder, BC Powder. Stop ANY OVER THE COUNTER supplements until after surgery.  You may however, continue to take Tylenol /Hydrocodone  if needed for pain up until the day of surgery.  Stop Metformin  2 days prior to surgery-Last dose will be today (01-21-24) Friday  Continue taking all of your other prescription medications up until the day of surgery.  ON THE DAY OF SURGERY ONLY TAKE THESE MEDICATIONS WITH SIPS OF WATER : -lovastatin  (MEVACOR )  -omeprazole  (PRILOSEC)   Use your Ventolin  Inhaler the day of surgery and bring Inhaler to the hospital  No Alcohol for 24 hours before or after surgery.  No Smoking including e-cigarettes for 24 hours before surgery.  No chewable tobacco products for at least 6 hours before surgery.  No nicotine  patches on the day of surgery.  Do not use any "recreational" drugs for at least a week (preferably 2 weeks) before your surgery.  Please be advised that the combination of cocaine and anesthesia may have negative outcomes, up to and including death. If you test positive  for cocaine, your surgery will be cancelled.  On the morning of surgery brush your teeth with toothpaste and water , you may rinse your mouth with mouthwash if you wish. Do not swallow any toothpaste or mouthwash.  Do not wear jewelry, make-up, hairpins, clips or nail polish.  For welded (permanent) jewelry: bracelets, anklets, waist bands, etc.  Please have this removed prior to surgery.  If it is not removed, there is a chance that hospital personnel will need to cut it off on the day of surgery.  Do not wear lotions, powders, or perfumes.   Do not shave body hair from the neck down 48 hours before surgery.  Contact lenses, hearing aids and dentures may not be worn into surgery.  Do not bring valuables to the hospital. [facility] is not responsible for any missing/lost belongings or valuables.   Notify your doctor if there is any change in your medical condition (cold, fever, infection).  Wear comfortable clothing (specific to your surgery type) to the hospital.  After surgery, you can help prevent lung complications by doing breathing exercises.  Take deep breaths and cough every 1-2 hours. Your doctor may order a device called an Incentive Spirometer to help you take deep breaths. When coughing or sneezing, hold a pillow firmly against your incision with both hands. This is called "splinting." Doing this helps protect your incision. It also decreases belly discomfort.  If you are being admitted to the hospital overnight, leave your suitcase in the car. After surgery it may be brought to your room.  In case of increased  patient census, it may be necessary for you, the patient, to continue your postoperative care in the Same Day Surgery department.  If you are being discharged the day of surgery, you will not be allowed to drive home. You will need a responsible individual to drive you home and stay with you for 24 hours after surgery.   If you are taking public transportation, you  will need to have a responsible individual with you.  Please call the Pre-admissions Testing Dept. at [phone] if you have any questions about these instructions.  Surgery Visitation Policy:  Patients having surgery or a procedure may have two visitors.  Children under the age of 73 must have an adult with them who is not the patient.

## 2024-01-23 MED ORDER — SODIUM CHLORIDE 0.9 % IV SOLN: INTRAVENOUS | Status: DC

## 2024-01-23 MED ORDER — ORAL CARE MOUTH RINSE
15.0000 mL | Freq: Once | OROMUCOSAL | Status: AC
Start: 2024-01-23 — End: 2024-01-24

## 2024-01-23 MED ORDER — CHLORHEXIDINE GLUCONATE 0.12 % MT SOLN
15.0000 mL | Freq: Once | OROMUCOSAL | Status: AC
  Administered 2024-01-24: 15 mL via OROMUCOSAL

## 2024-01-24 ENCOUNTER — Ambulatory Visit: Admitting: General Practice

## 2024-01-24 ENCOUNTER — Encounter
Admission: RE | Disposition: A | Discharge status: Home / Self Care | Payer: Self-pay | Source: Home / Self Care | Attending: Internal Medicine

## 2024-01-24 ENCOUNTER — Ambulatory Visit
Admission: RE | Admit: 2024-01-24 | Discharge: 2024-01-24 | Disposition: A | Discharge status: Home / Self Care | Attending: Internal Medicine | Admitting: Internal Medicine

## 2024-01-24 ENCOUNTER — Ambulatory Visit

## 2024-01-24 ENCOUNTER — Encounter: Payer: Self-pay | Admitting: Internal Medicine

## 2024-01-24 ENCOUNTER — Other Ambulatory Visit: Payer: Self-pay

## 2024-01-24 DIAGNOSIS — R911 Solitary pulmonary nodule: Secondary | ICD-10-CM | POA: Diagnosis not present

## 2024-01-24 DIAGNOSIS — E1122 Type 2 diabetes mellitus with diabetic chronic kidney disease: Secondary | ICD-10-CM | POA: Diagnosis not present

## 2024-01-24 DIAGNOSIS — R599 Enlarged lymph nodes, unspecified: Secondary | ICD-10-CM

## 2024-01-24 DIAGNOSIS — J411 Mucopurulent chronic bronchitis: Secondary | ICD-10-CM

## 2024-01-24 DIAGNOSIS — N183 Chronic kidney disease, stage 3 unspecified: Secondary | ICD-10-CM | POA: Diagnosis not present

## 2024-01-24 DIAGNOSIS — I509 Heart failure, unspecified: Secondary | ICD-10-CM | POA: Diagnosis not present

## 2024-01-24 DIAGNOSIS — C3412 Malignant neoplasm of upper lobe, left bronchus or lung: Secondary | ICD-10-CM | POA: Diagnosis not present

## 2024-01-24 DIAGNOSIS — R0989 Other specified symptoms and signs involving the circulatory and respiratory systems: Secondary | ICD-10-CM | POA: Diagnosis not present

## 2024-01-24 DIAGNOSIS — R918 Other nonspecific abnormal finding of lung field: Secondary | ICD-10-CM | POA: Diagnosis not present

## 2024-01-24 DIAGNOSIS — Z87891 Personal history of nicotine dependence: Secondary | ICD-10-CM | POA: Diagnosis not present

## 2024-01-24 DIAGNOSIS — Z01818 Encounter for other preprocedural examination: Secondary | ICD-10-CM

## 2024-01-24 DIAGNOSIS — I130 Hypertensive heart and chronic kidney disease with heart failure and stage 1 through stage 4 chronic kidney disease, or unspecified chronic kidney disease: Secondary | ICD-10-CM | POA: Diagnosis not present

## 2024-01-24 DIAGNOSIS — Z7984 Long term (current) use of oral hypoglycemic drugs: Secondary | ICD-10-CM | POA: Diagnosis not present

## 2024-01-24 HISTORY — PX: BRONCHOSCOPY, WITH BIOPSY USING ELECTROMAGNETIC NAVIGATION: SHX7536

## 2024-01-24 HISTORY — PX: ENDOBRONCHIAL ULTRASOUND: SHX5096

## 2024-01-24 LAB — GLUCOSE, CAPILLARY
Glucose-Capillary: 172 mg/dL — ABNORMAL HIGH (ref 70–99)
Glucose-Capillary: 135 mg/dL — ABNORMAL HIGH (ref 70–99)

## 2024-01-24 SURGERY — BRONCHOSCOPY, WITH BIOPSY USING ELECTROMAGNETIC NAVIGATION
Anesthesia: General | Laterality: Right

## 2024-01-24 MED ORDER — OXYCODONE HCL 5 MG PO TABS
5.0000 mg | ORAL_TABLET | Freq: Once | ORAL | Status: AC | PRN
  Administered 2024-01-24: 5 mg via ORAL

## 2024-01-24 MED ORDER — MIDAZOLAM HCL 2 MG/2ML IJ SOLN
INTRAMUSCULAR | Status: DC | PRN
  Administered 2024-01-24: 2 mg via INTRAVENOUS

## 2024-01-24 MED ORDER — MIDAZOLAM HCL 2 MG/2ML IJ SOLN
INTRAMUSCULAR | Status: AC
  Filled 2024-01-24: qty 2

## 2024-01-24 MED ORDER — OXYCODONE HCL 5 MG PO TABS
ORAL_TABLET | ORAL | Status: AC
  Filled 2024-01-24: qty 1

## 2024-01-24 MED ORDER — ROCURONIUM BROMIDE 10 MG/ML (PF) SYRINGE
PREFILLED_SYRINGE | INTRAVENOUS | Status: AC
  Filled 2024-01-24: qty 10

## 2024-01-24 MED ORDER — PHENYLEPHRINE 80 MCG/ML (10ML) SYRINGE FOR IV PUSH (FOR BLOOD PRESSURE SUPPORT)
PREFILLED_SYRINGE | INTRAVENOUS | Status: DC | PRN
  Administered 2024-01-24 (×2): 160 ug via INTRAVENOUS
  Administered 2024-01-24 (×2): 80 ug via INTRAVENOUS
  Administered 2024-01-24 (×2): 160 ug via INTRAVENOUS

## 2024-01-24 MED ORDER — ONDANSETRON HCL 4 MG/2ML IJ SOLN
INTRAMUSCULAR | Status: AC
  Filled 2024-01-24: qty 2

## 2024-01-24 MED ORDER — CHLORHEXIDINE GLUCONATE 0.12 % MT SOLN
OROMUCOSAL | Status: AC
  Filled 2024-01-24: qty 15

## 2024-01-24 MED ORDER — FENTANYL CITRATE (PF) 100 MCG/2ML IJ SOLN
INTRAMUSCULAR | Status: AC
  Filled 2024-01-24: qty 2

## 2024-01-24 MED ORDER — PHENYLEPHRINE 80 MCG/ML (10ML) SYRINGE FOR IV PUSH (FOR BLOOD PRESSURE SUPPORT)
PREFILLED_SYRINGE | INTRAVENOUS | Status: AC
  Filled 2024-01-24: qty 10

## 2024-01-24 MED ORDER — OXYCODONE HCL 5 MG/5ML PO SOLN: 5.0000 mg | Freq: Once | ORAL | Status: AC | PRN

## 2024-01-24 MED ORDER — PROPOFOL 10 MG/ML IV BOLUS
INTRAVENOUS | Status: AC
  Filled 2024-01-24: qty 20

## 2024-01-24 MED ORDER — ROCURONIUM BROMIDE 100 MG/10ML IV SOLN
INTRAVENOUS | Status: DC | PRN
  Administered 2024-01-24: 20 mg via INTRAVENOUS
  Administered 2024-01-24: 60 mg via INTRAVENOUS

## 2024-01-24 MED ORDER — DEXAMETHASONE SODIUM PHOSPHATE 10 MG/ML IJ SOLN
INTRAMUSCULAR | Status: AC
Start: 2024-01-24 — End: ?
  Filled 2024-01-24: qty 1

## 2024-01-24 MED ORDER — FENTANYL CITRATE (PF) 100 MCG/2ML IJ SOLN
INTRAMUSCULAR | Status: DC | PRN
  Administered 2024-01-24 (×2): 50 ug via INTRAVENOUS

## 2024-01-24 MED ORDER — DEXAMETHASONE SODIUM PHOSPHATE 10 MG/ML IJ SOLN
INTRAMUSCULAR | Status: DC | PRN
  Administered 2024-01-24: 10 mg via INTRAVENOUS

## 2024-01-24 MED ORDER — SUGAMMADEX SODIUM 200 MG/2ML IV SOLN
INTRAVENOUS | Status: DC | PRN
  Administered 2024-01-24: 200 mg via INTRAVENOUS

## 2024-01-24 MED ORDER — FENTANYL CITRATE (PF) 100 MCG/2ML IJ SOLN: 25.0000 ug | INTRAMUSCULAR | Status: DC | PRN

## 2024-01-24 MED ORDER — PROPOFOL 10 MG/ML IV BOLUS
INTRAVENOUS | Status: DC | PRN
  Administered 2024-01-24: 150 mg via INTRAVENOUS

## 2024-01-24 MED ORDER — ONDANSETRON HCL 4 MG/2ML IJ SOLN
INTRAMUSCULAR | Status: DC | PRN
  Administered 2024-01-24: 4 mg via INTRAVENOUS

## 2024-01-24 MED ORDER — LIDOCAINE HCL (CARDIAC) PF 100 MG/5ML IV SOSY
PREFILLED_SYRINGE | INTRAVENOUS | Status: DC | PRN
  Administered 2024-01-24: 60 mg via INTRAVENOUS

## 2024-01-24 NOTE — Transfer of Care (Signed)
 Immediate Anesthesia Transfer of Care Note  Patient: Noah Velasquez  Procedure(s) Performed: BRONCHOSCOPY, WITH BIOPSY USING ELECTROMAGNETIC NAVIGATION (Left) ENDOBRONCHIAL ULTRASOUND (EBUS) (Right)  Patient Location: PACU  Anesthesia Type:General  Level of Consciousness: awake, alert , and oriented  Airway & Oxygen Therapy: Patient Spontanous Breathing and Patient connected to face mask oxygen  Post-op Assessment: Report given to RN and Post -op Vital signs reviewed and stable  Post vital signs: Reviewed and stable  Last Vitals:  Vitals Value Taken Time  BP 137/87 01/24/24 1147  Temp    Pulse    Resp 24 01/24/24 1149  SpO2    Vitals shown include unfiled device data.  Last Pain:  Vitals:   01/24/24 0852  TempSrc: Temporal  PainSc: 9          Complications: No notable events documented.

## 2024-01-24 NOTE — Anesthesia Postprocedure Evaluation (Signed)
 Anesthesia Post Note  Patient: Noah Velasquez  Procedure(s) Performed: BRONCHOSCOPY, WITH BIOPSY USING ELECTROMAGNETIC NAVIGATION (Left) ENDOBRONCHIAL ULTRASOUND (EBUS) (Right)  Patient location during evaluation: Endoscopy Anesthesia Type: General Level of consciousness: awake and alert Pain management: pain level controlled Vital Signs Assessment: post-procedure vital signs reviewed and stable Respiratory status: spontaneous breathing, nonlabored ventilation, respiratory function stable and patient connected to nasal cannula oxygen Cardiovascular status: blood pressure returned to baseline and stable Postop Assessment: no apparent nausea or vomiting Anesthetic complications: no  No notable events documented.   Last Vitals:  Vitals:   01/24/24 0852 01/24/24 1147  BP: (!) 149/92 137/87  Pulse: 83 79  Resp: 16 15  Temp: 36.7 C (!) 36.3 C  SpO2: 100% 97%    Last Pain:  Vitals:   01/24/24 1147  TempSrc:   PainSc: 0-No pain                 Enrique Harvest

## 2024-01-24 NOTE — Op Note (Signed)
 PROCEDURE: BRONCHOSCOPY Therapeutic Aspiration of Tracheobronchial Tree  PROCEDURE DATE: 01/24/2024  TIME:   Code Status History     Date Active Date Inactive Code Status Order ID Comments User Context   01/15/2024 0755 01/18/2024 2130 Full Code [id number]  [place], MD Inpatient   01/15/2024 0621 01/15/2024 0755 Full Code [id number]  Mansy, Anastasio Kaska, MD Inpatient   07/31/2022 1331 08/01/2022 2326 Full Code [id number]  Rosena Conradi, MD ED   06/01/2022 0851 06/03/2022 2032 Full Code [id number]  Fidencio Hue, MD ED   12/21/2021 2344 12/24/2021 1852 Full Code [id number]  Mansy, Anastasio Kaska, MD ED   12/05/2019 1220 12/09/2019 1646 Full Code [id number]  Read Camel, MD ED   04/06/2019 0048 04/07/2019 1845 Full Code [id number]  Elvin Hammer, MD Inpatient   06/09/2018 2057 06/12/2018 1644 Full Code [id number]  Susannah Epp, MD ED   05/06/2018 1330 05/08/2018 1540 Full Code [id number]  Christina Coyer, MD ED   03/29/2018 1109 04/04/2018 1616 Full Code [id number]  Cher Cordial, MD Inpatient   04/03/2017 0314 04/04/2017 1741 Full Code [id number]  Anastasio Balsam, MD Inpatient   04/29/2016 1122 04/30/2016 1847 Full Code [id number]  Alexis Anger, MD ED    Questions for Most Recent Historical Code Status (Order [id number])     Question Answer   By: Consent: discussion documented in EHR                Indications/Preliminary Diagnosis: lung nodule and adenopathy  Consent: (Place X beside choice/s below)  The benefits, risks and possible complications of the procedure were        explained to:  _x__ patient  ___ patient's family  ___ other:___________  who verbalized understanding and gave:  ___ verbal  ___ written  _x__ verbal and written  ___ telephone  ___ other:________ consent.      Unable to obtain consent; procedure performed on emergent basis.     Other:       PRESEDATION ASSESSMENT: History and Physical has been performed. Patient meds and allergies have been reviewed. Presedation  airway examination has been performed and documented. Baseline vital signs, sedation score, oxygenation status, and cardiac rhythm were reviewed. Patient was deemed to be in satisfactory condition to undergo the procedure.  Refer to Anesthesiology Records      PROCEDURE DETAILS: Timeout performed and correct patient, name, & ID confirmed. Following prep per Pulmonary policy, appropriate sedation was administered. The Bronchoscope was inserted in to oral cavity with bite block in place. Therapeutic aspiration of Tracheobronchial tree was performed.  Airway exam proceeded with findings, technical procedures, and specimen collection as noted below. At the end of exam the scope was withdrawn without incident. Impression and Plan as noted below.           Airway Prep (Place X beside choice below)   1% Transtracheal Lidocaine Anesthetization 7 cc   Patient prepped per Bronchoscopy Lab Policy       Insertion Route (Place X beside choice below)   Nasal   Oral  x Endotracheal Tube   Tracheostomy    TECHNICAL PROCEDURES: (Place X beside choice below)   Procedures  Description    None     Electrocautery     Cryotherapy     Balloon Dilatation     Bronchography     Stent Placement   x  Therapeutic Aspiration Extracted secretions    Laser/Argon Plasma    Brachytherapy Catheter Placement    Foreign Body Removal  SPECIMENS (Sites): (Place X beside choice below)  Specimens Description   No Specimens Obtained     Washings    Lavage    Biopsies    Fine Needle Aspirates    Brushings    Sputum    FINDINGS: mucoid secretions in all segments      IMPRESSION:POST-PROCEDURE DX:  Mucoid secretions     Sundra Engel Nestora Baptise, M.D.  [facility]  Medical Director [facility] Medical Director [facility] Cardio-Pulmonary Department

## 2024-01-24 NOTE — Anesthesia Preprocedure Evaluation (Signed)
 Anesthesia Evaluation  Patient identified by MRN, date of birth, ID band Patient awake    Reviewed: Allergy & Precautions, NPO status , Patient's Chart, lab work & pertinent test results  Airway Mallampati: III  TM Distance: >3 FB Neck ROM: full    Dental  (+) Chipped, Dental Advidsory Given   Pulmonary COPD, Patient abstained from smoking., former smoker   Pulmonary exam normal        Cardiovascular hypertension, +CHF  Normal cardiovascular exam     Neuro/Psych  PSYCHIATRIC DISORDERS Anxiety Depression     Neuromuscular disease    GI/Hepatic Neg liver ROS,GERD  Medicated,,  Endo/Other  negative endocrine ROSdiabetes    Renal/GU Renal disease     Musculoskeletal   Abdominal   Peds  Hematology negative hematology ROS (+)   Anesthesia Other Findings Past Medical History: No date: Alcohol  use disorder No date: Aortic atherosclerosis (HCC) No date: Bronchiolo-alveolar adenocarcinoma of left lung (HCC) No date: CHF (congestive heart failure) (HCC) No date: Chronic hip pain No date: Chronic pancreatitis (HCC) No date: Cigarette smoker No date: CKD (chronic kidney disease) stage 3, GFR 30-59 ml/min (HCC) No date: COPD (chronic obstructive pulmonary disease) (HCC) No date: Crack cocaine use     Comment:  as of 01-21-24, pt states he last used a "few weeks ago" No date: Depression No date: DM (diabetes mellitus), type 2 (HCC) No date: Dyspnea No date: ED (erectile dysfunction) 2021: ESBL (extended spectrum beta-lactamase) producing bacteria  infection No date: GERD (gastroesophageal reflux disease) No date: Hyperlipidemia No date: Hypertension No date: ILD (interstitial lung disease) (HCC) No date: Marijuana use No date: Osteoarthritis No date: Pneumonia No date: PTSD (post-traumatic stress disorder) No date: Seizure (HCC)     Comment:  x1 years ago No date: Viral infection of left eye  Past Surgical  History: No date: pancreatic stent placement     Reproductive/Obstetrics negative OB ROS                             Anesthesia Physical Anesthesia Plan  ASA: 3  Anesthesia Plan: General ETT   Post-op Pain Management:    Induction: Intravenous  PONV Risk Score and Plan: 2 and Ondansetron , Dexamethasone and Midazolam  Airway Management Planned: Oral ETT  Additional Equipment:   Intra-op Plan:   Post-operative Plan: Extubation in OR  Informed Consent: I have reviewed the patients History and Physical, chart, labs and discussed the procedure including the risks, benefits and alternatives for the proposed anesthesia with the patient or authorized representative who has indicated his/her understanding and acceptance.     Dental Advisory Given  Plan Discussed with: Anesthesiologist, CRNA and Surgeon  Anesthesia Plan Comments: (Patient consented for risks of anesthesia including but not limited to:  - adverse reactions to medications - damage to eyes, teeth, lips or other oral mucosa - nerve damage due to positioning  - sore throat or hoarseness - Damage to heart, brain, nerves, lungs, other parts of body or loss of life  Patient voiced understanding and assent.)       Anesthesia Quick Evaluation

## 2024-01-24 NOTE — Anesthesia Procedure Notes (Signed)
 Procedure Name: Intubation Date/Time: 01/24/2024 10:02 AM  Performed by: Orin Birk, CRNAPre-anesthesia Checklist: Patient identified, Emergency Drugs available, Suction available and Patient being monitored Patient Re-evaluated:Patient Re-evaluated prior to induction Oxygen Delivery Method: Circle system utilized Preoxygenation: Pre-oxygenation with 100% oxygen Induction Type: IV induction Ventilation: Mask ventilation without difficulty Laryngoscope Size: McGrath and 4 Grade View: Grade I Tube type: Oral Tube size: 8.0 mm Number of attempts: 1 Airway Equipment and Method: Video-laryngoscopy and Stylet Placement Confirmation: ETT inserted through vocal cords under direct vision, positive ETCO2 and breath sounds checked- equal and bilateral Secured at: 22 cm Tube secured with: Tape Dental Injury: Teeth and Oropharynx as per pre-operative assessment

## 2024-01-24 NOTE — Op Note (Signed)
 PROCEDURE: ENDOBRONCHIAL ULTRASOUND   PROCEDURE DATE: 01/24/2024  TIME:   Code Status History     Date Active Date Inactive Code Status Order ID Comments User Context   01/15/2024 0755 01/18/2024 2130 Full Code [id number]  [place], MD Inpatient   01/15/2024 [id number] 01/15/2024 0755 Full Code [id number]  Mansy, Anastasio Kaska, MD Inpatient   07/31/2022 1331 08/01/2022 2326 Full Code [id number]  Rosena Conradi, MD ED   06/01/2022 0851 06/03/2022 2032 Full Code [id number]  Fidencio Hue, MD ED   12/21/2021 2344 12/24/2021 1852 Full Code [id number]  Mansy, Anastasio Kaska, MD ED   12/05/2019 1220 12/09/2019 1646 Full Code [id number]  Read Camel, MD ED   04/06/2019 0048 04/07/2019 1845 Full Code [id number]  Elvin Hammer, MD Inpatient   06/09/2018 2057 06/12/2018 1644 Full Code [id number]  Susannah Epp, MD ED   05/06/2018 1330 05/08/2018 1540 Full Code [id number]  Christina Coyer, MD ED   03/29/2018 1109 04/04/2018 1616 Full Code [id number]  Cher Cordial, MD Inpatient   04/03/2017 0314 04/04/2017 1741 Full Code [id number]  Anastasio Balsam, MD Inpatient   04/29/2016 1122 04/30/2016 1847 Full Code [id number]  Alexis Anger, MD ED    Questions for Most Recent Historical Code Status (Order [id number])     Question Answer   By: Consent: discussion documented in EHR                Indications/Preliminary Diagnosis:Adenopathy  Consent: (Place X beside choice/s below)  The benefits, risks and possible complications of the procedure were        explained to:  ___ patient  _x__ patient's family  ___ other:___________  who verbalized understanding and gave:  ___ verbal  ___ written  _x__ verbal and written  ___ telephone  ___ other:________ consent.      Unable to obtain consent; procedure performed on emergent basis.     Other:      PREMEDICATIONS: SEE ANESTHESIOLOGY RECORDS   Insertion Route (Place X beside choice below)   Nasal   Oral  x Endotracheal Tube   Tracheostomy   INTRAPROCEDURE MEDICATIONS: SEE  ANESTHESIOLOGY RECORDS   PROCEDURE DETAILS: Timeout performed and correct patient, name, & ID confirmed. Following prep per Pulmonary policy, appropriate sedation was administered.  I proceeded with introducing the endobronchial US  scope and findings, technical procedures, and specimen collection as noted below. At the end of exam the scope was withdrawn without incident. Impression and Plan as noted below.   SPECIMENS (Sites): (Place X beside choice below)  Specimens Description   No Specimens Obtained     Washings    Lavage    Biopsies   x Fine Needle Aspirates RT paratracheal LN with surrounding blood vessels(2 samples taken)   Brushings    Sputum    FINDINGS: RT PARATRACHEAL ADENOPATHY 2x1 CM surrounded by blood vessels, no significant subcarinal adenopathy noted. Patient with high risk for bleeding, only 2 samples taken.   ESTIMATED BLOOD LOSS: none COMPLICATIONS/RESOLUTION: none       IMPRESSION:POST-PROCEDURE DX: ADENOPATHY    RECOMMENDATION/PLAN:  Follow up Pathology Reports     Lady Pier, M.D.  [facility] [facility]  Medical Director [facility] Medical Director [facility] Cardio-Pulmonary Department

## 2024-01-24 NOTE — Op Note (Signed)
 Electromagnetic Navigation Bronchoscopy  USING ROBOTICS NAVIGATIONAL ION SYSTEM Indication: lung nodule LUL  Preoperative Diagnosis:lung nodule Post Procedure Diagnosis:lung nodule Consent: Verbal/Written  The Risks and Benefits of the procedure explained to patient/family prior to start of procedure and I have discussed the risk for acute bleeding, increased chance of infection, increased chance of respiratory failure and cardiac arrest and death.  I have also explained to avoid all types of NSAIDs to decrease chance of bleeding, and to avoid food and drinks the midnight prior to procedure.  The procedure consists of a video camera with a light source to be placed and inserted  into the lungs to  look for abnormal tissue and to obtain tissue samples by using needle and biopsy tools.  The patient/family understand the risks and benefits and have agreed to proceed with procedure.   Hand washing performed prior to starting the procedure.   Type of Anesthesia: see Anesthesiology records .   Procedure Performed:  Virtual Bronchoscopy with Multi-planar Image analysis, 3-D reconstruction of coronal, sagittal and multi-planar images for the purposes of planning real-time bronchoscopy using the Sensing Navigation Bronchoscopy System ION Robotics system  Description of Procedure: After obtaining informed consent from the patient, the above sedative and anesthetic measures were carried out, flexible fiberoptic bronchoscope was inserted via Endotracheal tube after patient was intubated by CNA/Anesthesiologist to clear airways  The virtual camera was then placed into the central portion of the trachea. The trachea itself was inspected.  The main carina, right and left midstem bronchus and all the segmental and subsegmental airways by virtual bronchoscopy were inspected.  Registration of airways completed using ION system  The camera was directed to standard registration points at the following  centers: main carina, right upper lobe bronchus, right lower lobe bronchus, right middle lobe bronchus, left upper lobe bronchus, and the left lower lobe bronchus.   Using CT arm and Fluoroscopy, I was able to confirm needle, biopsy placement into the lesion/target where several samples were taken.    The scope was then navigated to the LUL for tissue sampling  Fluoroscopy:  Fluoroscopy was utilized during the course of this procedure to assure that biopsies were taken in a safe manner under fluoroscopic guidance with spot films required.       Specimans Obtained:  Transbronchial Fine Needle Aspirations 21G times:5  Transbronchial Forceps Biopsy times:5  Transbronchial Triple Needle Brush:2  BAL for MICOR and CYTO obtained    Complications:None  Estimated Blood Loss: minimal approx 1cc  Monitoring:  The patient was monitored with continuous oximetry and received supplemental nasal cannula oxygen throughout the procedure. In addition, serial blood pressure measurements and continuous electrocardiography showed these physiologic parameters to remain tolerable throughout the procedure.   Assessment and Plan/Additional Comments: Follow up Pathology Reports    Lady Pier, M.D.  [employer] [facility]  Medical Director [facility] Medical Director [facility] Cardio-Pulmonary Department

## 2024-01-25 ENCOUNTER — Telehealth: Payer: Self-pay | Admitting: Pharmacist

## 2024-01-25 ENCOUNTER — Other Ambulatory Visit: Payer: Self-pay

## 2024-01-25 ENCOUNTER — Ambulatory Visit: Attending: Family | Admitting: Family

## 2024-01-25 ENCOUNTER — Encounter: Payer: Self-pay | Admitting: Internal Medicine

## 2024-01-25 ENCOUNTER — Other Ambulatory Visit (HOSPITAL_COMMUNITY): Payer: Self-pay

## 2024-01-25 VITALS — BP 144/96 | HR 81 | Wt 177.2 lb

## 2024-01-25 DIAGNOSIS — J449 Chronic obstructive pulmonary disease, unspecified: Secondary | ICD-10-CM | POA: Insufficient documentation

## 2024-01-25 DIAGNOSIS — I428 Other cardiomyopathies: Secondary | ICD-10-CM | POA: Insufficient documentation

## 2024-01-25 DIAGNOSIS — K861 Other chronic pancreatitis: Secondary | ICD-10-CM | POA: Diagnosis not present

## 2024-01-25 DIAGNOSIS — E785 Hyperlipidemia, unspecified: Secondary | ICD-10-CM | POA: Insufficient documentation

## 2024-01-25 DIAGNOSIS — I5032 Chronic diastolic (congestive) heart failure: Secondary | ICD-10-CM | POA: Insufficient documentation

## 2024-01-25 DIAGNOSIS — E119 Type 2 diabetes mellitus without complications: Secondary | ICD-10-CM | POA: Insufficient documentation

## 2024-01-25 DIAGNOSIS — F17210 Nicotine dependence, cigarettes, uncomplicated: Secondary | ICD-10-CM | POA: Insufficient documentation

## 2024-01-25 DIAGNOSIS — I110 Hypertensive heart disease with heart failure: Secondary | ICD-10-CM | POA: Diagnosis not present

## 2024-01-25 DIAGNOSIS — F1990 Other psychoactive substance use, unspecified, uncomplicated: Secondary | ICD-10-CM

## 2024-01-25 DIAGNOSIS — H0520 Unspecified exophthalmos: Secondary | ICD-10-CM

## 2024-01-25 DIAGNOSIS — F1010 Alcohol abuse, uncomplicated: Secondary | ICD-10-CM | POA: Insufficient documentation

## 2024-01-25 DIAGNOSIS — F4310 Post-traumatic stress disorder, unspecified: Secondary | ICD-10-CM

## 2024-01-25 DIAGNOSIS — I10 Essential (primary) hypertension: Secondary | ICD-10-CM | POA: Diagnosis not present

## 2024-01-25 DIAGNOSIS — C3492 Malignant neoplasm of unspecified part of left bronchus or lung: Secondary | ICD-10-CM | POA: Diagnosis not present

## 2024-01-25 DIAGNOSIS — Z7984 Long term (current) use of oral hypoglycemic drugs: Secondary | ICD-10-CM | POA: Insufficient documentation

## 2024-01-25 DIAGNOSIS — F5104 Psychophysiologic insomnia: Secondary | ICD-10-CM

## 2024-01-25 MED ORDER — LANCET DEVICE MISC
1.0000 | Freq: Three times a day (TID) | 0 refills | Status: AC
  Filled 2024-01-25 – 2024-03-03 (×2): qty 1, 30d supply, fill #0

## 2024-01-25 MED ORDER — LOVASTATIN 10 MG PO TABS
10.0000 mg | ORAL_TABLET | Freq: Every day | ORAL | 1 refills | Status: DC
  Filled 2024-01-25 (×2): qty 90, 90d supply, fill #0

## 2024-01-25 MED ORDER — BLOOD GLUCOSE MONITOR SYSTEM W/DEVICE KIT
1.0000 | PACK | Freq: Three times a day (TID) | 0 refills | Status: DC
  Filled 2024-01-25 (×2): qty 1, 30d supply, fill #0

## 2024-01-25 MED ORDER — EMPAGLIFLOZIN 10 MG PO TABS
10.0000 mg | ORAL_TABLET | Freq: Every day | ORAL | 3 refills | Status: DC
  Filled 2024-01-25: qty 30, 30d supply, fill #0

## 2024-01-25 MED ORDER — BLOOD GLUCOSE TEST VI STRP
1.0000 | ORAL_STRIP | Freq: Three times a day (TID) | 0 refills | Status: AC
  Filled 2024-01-25: qty 100, 34d supply, fill #0
  Filled 2024-01-25: qty 100, 33d supply, fill #0
  Filled 2024-01-25 (×2): qty 100, 34d supply, fill #0

## 2024-01-25 MED ORDER — ACCU-CHEK SOFTCLIX LANCETS MISC
1.0000 | Freq: Three times a day (TID) | 0 refills | Status: AC
  Filled 2024-01-25 (×2): qty 100, 34d supply, fill #0

## 2024-01-25 MED ORDER — AMLODIPINE-OLMESARTAN 10-40 MG PO TABS
1.0000 | ORAL_TABLET | Freq: Every day | ORAL | 1 refills | Status: DC
  Filled 2024-01-25: qty 90, 90d supply, fill #0

## 2024-01-25 MED ORDER — EMPAGLIFLOZIN 10 MG PO TABS: 10.0000 mg | ORAL_TABLET | Freq: Every day | ORAL | Status: DC

## 2024-01-25 MED ORDER — TRAZODONE HCL 150 MG PO TABS
150.0000 mg | ORAL_TABLET | Freq: Every evening | ORAL | 1 refills | Status: DC | PRN
  Filled 2024-01-25 (×2): qty 90, 90d supply, fill #0

## 2024-01-25 MED ORDER — OMEPRAZOLE 20 MG PO CPDR
20.0000 mg | DELAYED_RELEASE_CAPSULE | Freq: Every day | ORAL | 1 refills | Status: AC
  Filled 2024-01-25 (×2): qty 90, 90d supply, fill #0

## 2024-01-25 MED ORDER — AMLODIPINE-OLMESARTAN 10-40 MG PO TABS
1.0000 | ORAL_TABLET | Freq: Every day | ORAL | 3 refills | Status: DC
  Filled 2024-01-25: qty 30, 30d supply, fill #0

## 2024-01-25 MED ORDER — LOVASTATIN 10 MG PO TABS
10.0000 mg | ORAL_TABLET | Freq: Every day | ORAL | 1 refills | Status: DC
  Filled 2024-01-25: qty 30, 30d supply, fill #0

## 2024-01-25 MED ORDER — METFORMIN HCL 500 MG PO TABS
500.0000 mg | ORAL_TABLET | Freq: Every day | ORAL | 1 refills | Status: DC
  Filled 2024-01-25 (×2): qty 90, 90d supply, fill #0

## 2024-01-25 NOTE — Telephone Encounter (Signed)
 Called [facility] to set up mail order. Will follow up again in several days to make sure that the patient is properly enrolled.

## 2024-01-25 NOTE — Progress Notes (Signed)
 Can you send his medications for a 90-day supply to the requested pharmacy as along with a glucometer

## 2024-01-25 NOTE — Progress Notes (Signed)
 Advanced Heart Failure Clinic Note   Referring Physician: recent admission PCP: Carollynn Cirri, NP (last seen 04/25) Cardiologist: None   Chief Complaint: shortness of breath   HPI:  Noah Velasquez is a 58 y/o male with a history of COPD, HTN, IIDM, HLD, chronic pancreatitis, alcohol  abuse, bronchiolo-alveolar adenocarcinoma of left lung (05/25) and chronic heart failure.   Admitted 01/14/24 with wheezing and shortness of breath. On admission, BNP was 317.8, HS-troponin was 26 > 23, UDS positive for cocaine, and TSH was 1.165. Chest x-ray noted pulmonary vascular congestion without airspace edema. 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 Echocardiogram 01/15/24 noted LVEFof 50-55%, mild LVH, grade I diastolic dysfunction. Initially needed oxygen at 2L and unable to be weaned off of it. Brain MRI negative. Bronchoscopy and biopsy to be done as outpatient.   He presents today for his initial HF visit with a chief complaint of shortness of breath. Has associated fatigue, occasional palpitations, dizziness, left foot neuropathy and productive cough with blood tinged sputum from bronchoscopy yesterday. Denies chest pain, abdominal distention, pedal edema.   Smoking ~ 3 cigarettes daily, 80 oz beer every other day, marijuana use   Review of Systems: [y] = yes, [ ]  = no   General: Weight gain [ ] ; Weight loss [ ] ; Anorexia [ ] ; Fatigue Davis.Dad ]; Fever [ ] ; Chills [ ] ; Weakness Davis.Dad ]  Cardiac: Chest pain/pressure [ ] ; Resting SOB [ ] ; Exertional SOB [ y]; Orthopnea [ ] ; Pedal Edema [ ] ; Palpitations [ y]; Syncope [ ] ; Presyncope [ ] ; Paroxysmal nocturnal dyspnea[ ]   Pulmonary: Cough [ y]; Wheezing[ ] ; Hemoptysis[ ] ; Sputum Davis.Dad ]; Snoring [ ]   GI: Vomiting[ ] ; Dysphagia[ ] ; Melena[ ] ; Hematochezia [ ] ; Heartburn[ ] ; Abdominal pain [ ] ; Constipation [ ] ; Diarrhea [ ] ; BRBPR [ ]   GU: Hematuria[ ] ; Dysuria [ ] ; Nocturia[ ]   Vascular: Pain in legs  with walking [ ] ; Pain in feet with lying flat [ ] ; Non-healing sores [ ] ; Stroke [ ] ; TIA [ ] ; Slurred speech [ ] ;  Neuro: Dizziness[y ]; Vertigo[ ] ; Seizures[ ] ; Paresthesias[ ] ;Blurred vision [ ] ; Diplopia [ ] ; Vision changes [ ]   Ortho/Skin: Arthritis [ ] ; Joint pain [ ] ; Muscle pain [ ] ; Joint swelling [ ] ; Back Pain [ ] ; Rash [ ]   Psych: Depression[ ] ; Anxiety[ ]   Heme: Bleeding problems [ ] ; Clotting disorders [ ] ; Anemia [ ]   Endocrine: Diabetes Davis.Dad ]; Thyroid  dysfunction[ ]    Past Medical History:  Diagnosis Date   Alcohol  use disorder    Aortic atherosclerosis (HCC)    Bronchiolo-alveolar adenocarcinoma of left lung (HCC)    CHF (congestive heart failure) (HCC)    Chronic hip pain    Chronic pancreatitis (HCC)    Cigarette smoker    CKD (chronic kidney disease) stage 3, GFR 30-59 ml/min (HCC)    COPD (chronic obstructive pulmonary disease) (HCC)    Crack cocaine use    as of 01-21-24, pt states he last used a "few weeks ago"   Depression    DM (diabetes mellitus), type 2 (HCC)    Dyspnea    ED (erectile dysfunction)    ESBL (extended spectrum beta-lactamase) producing bacteria infection 2021   GERD (gastroesophageal reflux disease)    Hyperlipidemia    Hypertension    ILD (interstitial lung disease) (HCC)    Marijuana use    Osteoarthritis    Pneumonia    PTSD (post-traumatic  stress disorder)    Seizure (HCC)    x1 years ago   Viral infection of left eye     Current Outpatient Medications  Medication Sig Dispense Refill   amLODipine -olmesartan  (AZOR ) 10-40 MG tablet Take 1 tablet by mouth daily. (Patient taking differently: Take 1 tablet by mouth every morning.) 90 tablet 1   lovastatin  (MEVACOR ) 10 MG tablet Take 1 tablet (10 mg total) by mouth at bedtime. (Patient taking differently: Take 10 mg by mouth every morning.) 90 tablet 1   metFORMIN  (GLUCOPHAGE ) 500 MG tablet Take 1 tablet (500 mg total) by mouth daily with breakfast. 90 tablet 1   Nebulizers  (COMPRESSOR/NEBULIZER) MISC Nebulizer and tubes/mask to use as directed with DuoNeb as needed 1 each 0   nicotine  (NICODERM CQ  - DOSED IN MG/24 HOURS) 21 mg/24hr patch Place 1 patch (21 mg total) onto the skin daily. 28 patch 0   omeprazole  (PRILOSEC) 20 MG capsule Take 1 capsule (20 mg total) by mouth daily. (Patient taking differently: Take 20 mg by mouth every morning.) 90 capsule 1   OXYGEN Inhale 2 L into the lungs as needed.     Pancrelipase , Lip-Prot-Amyl, (ZENPEP ) 15000-47000 units CPEP Take 1 capsule (15,000 Units total) by mouth 3 (three) times daily. 270 capsule 1   traZODone  (DESYREL ) 150 MG tablet Take 1 tablet (150 mg total) by mouth at bedtime as needed for sleep. 90 tablet 1   VENTOLIN  HFA 108 (90 Base) MCG/ACT inhaler Inhale 1-2 puffs into the lungs every 6 (six) hours as needed.     No current facility-administered medications for this visit.    Allergies  Allergen Reactions   Hctz [Hydrochlorothiazide] Swelling      Social History   Socioeconomic History   Marital status: Legally Separated    Spouse name: Not on file   Number of children: 2   Years of education: Not on file   Highest education level: High school graduate  Occupational History   Occupation: on disability  Tobacco Use   Smoking status: Former    Current packs/day: 0.25    Average packs/day: 0.3 packs/day for 15.0 years (3.8 ttl pk-yrs)    Types: Cigarettes   Smokeless tobacco: Never  Vaping Use   Vaping status: Some Days  Substance and Sexual Activity   Alcohol  use: Yes    Alcohol /week: 7.0 standard drinks of alcohol     Types: 7 Cans of beer per week    Comment: occ   Drug use: Yes    Types: Marijuana, "Crack" cocaine   Sexual activity: Yes    Birth control/protection: Condom  Other Topics Concern   Not on file  Social History Narrative   Not on file   Social Drivers of Health   Financial Resource Strain: Not on file  Food Insecurity: Food Insecurity Present (01/15/2024)   Hunger  Vital Sign    Worried About Running Out of Food in the Last Year: Sometimes true    Ran Out of Food in the Last Year: Sometimes true  Transportation Needs: No Transportation Needs (01/15/2024)   PRAPARE - Administrator, Civil Service (Medical): No    Lack of Transportation (Non-Medical): No  Physical Activity: Not on file  Stress: Not on file  Social Connections: Not on file  Intimate Partner Violence: Not At Risk (01/15/2024)   Humiliation, Afraid, Rape, and Kick questionnaire    Fear of Current or Ex-Partner: No    Emotionally Abused: No    Physically Abused:  No    Sexually Abused: No      Family History  Problem Relation Age of Onset   Diabetes Mother    Sleep apnea Mother    Emphysema Sister    CAD Neg Hx    Mental illness Neg Hx    Vitals:   01/25/24 1213  BP: (!) 144/96  Pulse: 81  SpO2: 95%  Weight: 177 lb 3.2 oz (80.4 kg)   Wt Readings from Last 3 Encounters:  01/25/24 177 lb 3.2 oz (80.4 kg)  01/18/24 169 lb 8.5 oz (76.9 kg)  12/07/23 181 lb (82.1 kg)   Lab Results  Component Value Date   CREATININE 1.05 01/17/2024   CREATININE 0.94 01/16/2024   CREATININE 0.97 01/15/2024    PHYSICAL EXAM:  General: Well appearing in wheelchair. No resp difficulty HEENT: normal Neck: supple, no JVD Cor: Regular rhythm, rate. No rubs, gallops or murmurs Lungs: clear Abdomen: soft, nontender, nondistended. Extremities: no cyanosis, clubbing, rash, trace pitting edema bilateral lower legs Neuro: alert & oriented X 3. Moves all 4 extremities w/o difficulty. Affect pleasant   ECG: not done   ASSESSMENT & PLAN:  1: NICM with preserved ejection fraction- - suspect due to HTN - NYHA class II - euvolemic - instructed to start weighing daily and parameters to call about discussed - Echo 01/15/24: LVEFof 50-55%, mild LVH, G1DD - continue amlodipine -olmesartan  10/40mg  daily for now - begin jardiance 10mg  daily; BMET next visit - discussing stopping  amlodipine -olmesartan  combo med at next visit, keep the ARB part and begin MRA at next visit - BNP 01/15/24 was 317.8  2: HTN- - BP 144/96 - saw PCP Thalia Filler) - BMET 01/17/24 reviewed: sodium 133, potassium 3.5, creatinine 1.05 & GFR >60 - BMET next visit  3: Lung cancer- - new diagnosis 05/25 - had bronchoscopy/ biopsy done yesterday and still has some blood tinged sputum today - to see pulmonology (Kasa) next week  4: DM- - A1c 12/07/23 was 6.5%  5: Substance use- - smoking 3 cigarettes daily - drinking 80 oz beer every other day - + marijuana - cessation discussed   6: Hyperlipidemia- - continue lovastatin  10mg  daily - LD 12/07/23 was 59   Return in 3 weeks, sooner if needed.   Charlette Console, FNP 01/25/24

## 2024-01-25 NOTE — Patient Instructions (Addendum)
 Start weighing daily and call for an overnight weight gain of 3 pounds or more or a weekly weight gain of more than 5 pounds.   BEGIN taking Jardiance 10 MG one tablet daily   Follow Up: In 3 weeks with [facility]

## 2024-01-26 ENCOUNTER — Ambulatory Visit

## 2024-01-26 LAB — CYTOLOGY - NON PAP

## 2024-01-26 LAB — SURGICAL PATHOLOGY

## 2024-01-26 NOTE — H&P (Signed)
   CHIEF COMPLAINT:  LLL nodule  Subjective  PLAN FOR ROBOTICS CASE  The Risks and Benefits of the Bronchoscopy procedure   I have discussed the risk for Acute Bleeding, increased chance of Infection, increased chance of Respiratory Failure and Cardiac Arrest, increased chance of pneumothorax and collapsed lung, as well as increased Stroke and Death.  I have also explained to avoid all types of NSAIDs 1 week prior to procedure date  to decrease chance of bleeding, and also to avoid food and drinks the midnight prior to procedure.  The procedure consists of a video camera with a light source to be placed and inserted  into the lungs to  look for abnormal tissue and to obtain tissue samples by using needle and biopsy tools.  The patient understand the risks and benefits and have agreed to proceed with procedure.      Objective   Examination:  General exam: Appears calm and comfortable  Respiratory system: Clear to auscultation. Respiratory effort normal. HEENT: [place]/AT, PERRLA, no thrush, no stridor. Cardiovascular system: S1 & S2 heard, RRR. No JVD, murmurs, rubs, gallops or clicks. No pedal edema. Gastrointestinal system: Abdomen is nondistended, soft and nontender. No organomegaly or masses felt. Normal bowel sounds heard. Central nervous system: Alert and oriented. No focal neurological deficits. Extremities: Symmetric 5 x 5 power. Skin: No rashes, lesions or ulcers Psychiatry: Judgement and insight appear normal. Mood & affect appropriate.   VITALS:  temporal temperature is 97.1 F (36.2 C) (abnormal). His blood pressure is 136/81 and his pulse is 73. His respiration is 16 and oxygen saturation is 94%.   I personally reviewed Labs under Results section.  Radiology Reports DG Chest Port 1 View Result Date: 01/24/2024 CLINICAL DATA:  Bronchoscopy EXAM: PORTABLE CHEST 1 VIEW COMPARISON:  Chest x-ray performed January 16, 2024 FINDINGS: No pneumothorax. Parenchymal opacity projects  over the left mid lung zone. Interstitial airspace opacities are also present. IMPRESSION: 1. No pneumothorax. 2. Parenchymal opacity at the site of suspected bronchoscopy/biopsy. 3. Low lung volumes with interstitial airspace opacities. Electronically Signed   By: Reagan Camera M.D.   On: 01/24/2024 12:45       Assessment/Plan:  Plan for ROBOTICS CASE    Evea Sheek Nestora Baptise, M.D.  [facility]  Medical Director [facility] Medical Director [facility] Cardio-Pulmonary Department

## 2024-01-27 ENCOUNTER — Other Ambulatory Visit (HOSPITAL_COMMUNITY): Payer: Self-pay

## 2024-01-27 ENCOUNTER — Encounter: Payer: Self-pay | Admitting: *Deleted

## 2024-01-27 LAB — CULTURE, BAL-QUANTITATIVE W GRAM STAIN
Culture: NO GROWTH
Special Requests: NORMAL

## 2024-01-27 NOTE — Progress Notes (Signed)
 PET scan approved with insurance with new lung cancer diagnosis. Pt has known transportation barriers and requests appts to be scheduled in the afternoon 3 days in advance to schedule transportation. PET scan scheduled for 6/20 at 1pm. Called and spoke to pt to confirm appt for PET scan. Pt stated that he has transportation problems and already has 2 appts scheduled next week. Stated that he cannot make his appt for PET on 6/20. Attempted to explain to pt rationale for PET scan but pt was agitated and not willing to listen at this time. Informed pt that will reschedule his PET scan for the following week and will review that appt with him after his visit with Dr. Randy Buttery on Monday 6/16. Pt remained agitated and hung up the phone.   Will follow up on Monday 6/16 at new pt consult.

## 2024-01-31 ENCOUNTER — Inpatient Hospital Stay: Attending: Oncology | Admitting: Oncology

## 2024-01-31 ENCOUNTER — Inpatient Hospital Stay

## 2024-01-31 ENCOUNTER — Encounter: Payer: Self-pay | Admitting: *Deleted

## 2024-01-31 ENCOUNTER — Encounter: Payer: Self-pay | Admitting: Oncology

## 2024-01-31 VITALS — BP 118/98 | HR 152 | Temp 97.8°F | Resp 19 | Wt 169.7 lb

## 2024-01-31 DIAGNOSIS — C3412 Malignant neoplasm of upper lobe, left bronchus or lung: Secondary | ICD-10-CM | POA: Insufficient documentation

## 2024-01-31 NOTE — Progress Notes (Signed)
 Patient has no concerns

## 2024-01-31 NOTE — Progress Notes (Signed)
 Met with patient during initial consult with Dr. Smith Robert. All questions answered during visit. Reviewed upcoming appts with patient. Contact info given and instructed to call with any questions or needs. Pt verbalized understanding.

## 2024-02-01 ENCOUNTER — Ambulatory Visit (INDEPENDENT_AMBULATORY_CARE_PROVIDER_SITE_OTHER): Admitting: Internal Medicine

## 2024-02-01 ENCOUNTER — Encounter: Payer: Self-pay | Admitting: Internal Medicine

## 2024-02-01 VITALS — BP 120/80 | HR 114 | Temp 98.5°F | Ht 67.5 in | Wt 170.2 lb

## 2024-02-01 DIAGNOSIS — C3410 Malignant neoplasm of upper lobe, unspecified bronchus or lung: Secondary | ICD-10-CM

## 2024-02-01 DIAGNOSIS — J441 Chronic obstructive pulmonary disease with (acute) exacerbation: Secondary | ICD-10-CM

## 2024-02-01 NOTE — Progress Notes (Signed)
 [facility] [place] [facility] Consultation      Date: 02/01/2024,   MRN# [id number] Noah Velasquez [birth year]   CHIEF COMPLAINT:   Abnormal CT chest  HISTORY OF PRESENT ILLNESS      58 year old pleasant African-American male seen today for assessment of abnormal CT chest   Patient admitted for increased work of breathing and shortness of breath, patient treated for COPD exacerbation chest x-ray obtained showed left lung abnormality   follow-up CT chest showed  3.7 x 1.8 cm lobulated mass in the left upper lobe abutting the chest wall, suspicious for primary bronchogenic carcinoma, with adjacent satellite nodularity.   Patient underwent Robotic Navigational Bronch on June 9Th Findings confirm Dx of Neuroendocrine carcinoma, poorly differentiated carcinoma, Small Cell Carcinoma, patient was called and notified  Findings relayed to patient in detail Oncology evaluation ongoing Cardiology evaluation ongoing  No exacerbation at this time No evidence of heart failure at this time No evidence or signs of infection at this time No respiratory distress No fevers, chills, nausea, vomiting, diarrhea No evidence of lower extremity edema No evidence hemoptysis     PAST MEDICAL HISTORY   Past Medical History:  Diagnosis Date   Alcohol  use disorder    Aortic atherosclerosis (HCC)    Bronchiolo-alveolar adenocarcinoma of left lung (HCC)    CHF (congestive heart failure) (HCC)    Chronic hip pain    Chronic pancreatitis (HCC)    Cigarette smoker    CKD (chronic kidney disease) stage 3, GFR 30-59 ml/min (HCC)    COPD (chronic obstructive pulmonary disease) (HCC)    Crack cocaine use    as of 01-21-24, pt states he last used a few weeks ago   Depression    DM (diabetes mellitus), type 2 (HCC)    Dyspnea    ED (erectile dysfunction)    ESBL (extended spectrum beta-lactamase) producing bacteria infection 2021   GERD (gastroesophageal reflux disease)    Hyperlipidemia     Hypertension    ILD (interstitial lung disease) (HCC)    Marijuana use    Osteoarthritis    Pneumonia    PTSD (post-traumatic stress disorder)    Seizure (HCC)    x1 years ago   Viral infection of left eye      SURGICAL HISTORY   Past Surgical History:  Procedure Laterality Date   BRONCHOSCOPY, WITH BIOPSY USING ELECTROMAGNETIC NAVIGATION Left 01/24/2024   Procedure: BRONCHOSCOPY, WITH BIOPSY USING ELECTROMAGNETIC NAVIGATION;  Surgeon: Cleve Dale, MD;  Location: [facility] ORS;  Service: Pulmonary;  Laterality: Left;   ENDOBRONCHIAL ULTRASOUND Right 01/24/2024   Procedure: ENDOBRONCHIAL ULTRASOUND (EBUS);  Surgeon: Humaira Sculley, MD;  Location: [facility] ORS;  Service: Pulmonary;  Laterality: Right;   pancreatic stent placement       FAMILY HISTORY   Family History  Problem Relation Age of Onset   Diabetes Mother    Sleep apnea Mother    Emphysema Sister    CAD Neg Hx    Mental illness Neg Hx      SOCIAL HISTORY   Social History   Tobacco Use   Smoking status: Former    Current packs/day: 0.25    Average packs/day: 0.3 packs/day for 15.0 years (3.8 ttl pk-yrs)    Types: Cigarettes   Smokeless tobacco: Never  Vaping Use   Vaping status: Some Days  Substance Use Topics   Alcohol  use: Yes    Alcohol /week: 7.0 standard drinks of alcohol     Types: 7 Cans of beer per week  Comment: occ   Drug use: Yes    Types: Marijuana, Crack cocaine    Comment: Marijuana every day. 7-44yrs clean of crack cocaine.     MEDICATIONS    Home Medication:  Current Outpatient Rx   Order #: [id number] Class: Normal   Order #: [id number] Class: Normal   Order #: [id number] Class: Normal   Order #: [id number] Class: Sample   Order #: [id number] Class: Normal   Order #: [id number] Class: Normal   Order #: [id number] Class: Normal   Order #: [id number] Class: Normal   Order #: [id number] Class: Historical Med   Order #: [id number] Class: Normal   Order #: [id number] Class: OTC   Order #: [id number] Class:  Normal   Order #: [id number] Class: Historical Med   Order #: [id number] Class: Normal   Order #: [id number] Class: Normal   Order #: [id number] Class: Historical Med    Current Medication:  Current Outpatient Medications:    Accu-Chek Softclix Lancets lancets, 1 each in the morning, at noon, and at bedtime. May substitute to any manufacturer covered by patient's insurance., Disp: 100 each, Rfl: 0   amLODipine -olmesartan  (AZOR ) 10-40 MG tablet, Take 1 tablet by mouth daily., Disp: 90 tablet, Rfl: 1   Blood Glucose Monitoring Suppl (BLOOD GLUCOSE MONITOR SYSTEM) w/Device KIT, 1 each by Does not apply route in the morning, at noon, and at bedtime. May substitute to any manufacturer covered by patient's insurance., Disp: 1 kit, Rfl: 0   empagliflozin  (JARDIANCE ) 10 MG TABS tablet, Take 1 tablet (10 mg total) by mouth daily before breakfast., Disp: , Rfl:    Glucose Blood (BLOOD GLUCOSE TEST STRIPS) STRP, 1 each by In Vitro route in the morning, at noon, and at bedtime. May substitute to any manufacturer covered by patient's insurance., Disp: 100 strip, Rfl: 0   Lancet Device MISC, 1 each by Does not apply route in the morning, at noon, and at bedtime. May substitute to any manufacturer covered by patient's insurance., Disp: 1 each, Rfl: 0   lovastatin  (MEVACOR ) 10 MG tablet, Take 1 tablet (10 mg total) by mouth at bedtime., Disp: 90 tablet, Rfl: 1   metFORMIN  (GLUCOPHAGE ) 500 MG tablet, Take 1 tablet (500 mg total) by mouth daily with breakfast., Disp: 90 tablet, Rfl: 1   naproxen  (NAPROSYN ) 250 MG tablet, Take 250 mg by mouth 2 (two) times daily with a meal., Disp: , Rfl:    Nebulizers (COMPRESSOR/NEBULIZER) MISC, Nebulizer and tubes/mask to use as directed with DuoNeb as needed, Disp: 1 each, Rfl: 0   nicotine  (NICODERM CQ  - DOSED IN MG/24 HOURS) 21 mg/24hr patch, Place 1 patch (21 mg total) onto the skin daily., Disp: 28 patch, Rfl: 0   omeprazole  (PRILOSEC) 20 MG capsule, Take 1 capsule (20 mg total) by  mouth daily., Disp: 90 capsule, Rfl: 1   OXYGEN, Inhale 2 L into the lungs as needed., Disp: , Rfl:    Pancrelipase , Lip-Prot-Amyl, (ZENPEP ) 15000-47000 units CPEP, Take 1 capsule (15,000 Units total) by mouth 3 (three) times daily., Disp: 270 capsule, Rfl: 1   traZODone  (DESYREL ) 150 MG tablet, Take 1 tablet (150 mg total) by mouth at bedtime as needed for sleep., Disp: 90 tablet, Rfl: 1   VENTOLIN  HFA 108 (90 Base) MCG/ACT inhaler, Inhale 1-2 puffs into the lungs every 6 (six) hours as needed., Disp: , Rfl:     ALLERGIES   Hctz [hydrochlorothiazide]  BP 120/80 (BP Location: Right Arm, Patient Position: Sitting, Cuff Size: Large)   Pulse (!) 114   Temp 98.5 F (36.9 C) (Oral)   Ht  5' 7.5 (1.715 m)   Wt 170 lb 3.2 oz (77.2 kg)   SpO2 95%   BMI 26.26 kg/m   Review of Systems: Gen:  Denies  fever, sweats, chills weight loss  HEENT: Denies blurred vision, double vision, ear pain, eye pain, hearing loss, nose bleeds, sore throat Cardiac:  No dizziness, chest pain or heaviness, chest tightness,edema, No JVD Resp:   No cough, -sputum production, -shortness of breath,-wheezing, -hemoptysis,  Other:  All other systems negative   Physical Examination:   General Appearance: No distress  EYES PERRLA, EOM intact.   NECK Supple, No JVD Pulmonary: normal breath sounds, No wheezing.  CardiovascularNormal S1,S2.  No m/r/g.   Abdomen: Benign, Soft, non-tender. Neurology UE/LE 5/5 strength, no focal deficits Ext pulses intact, cap refill intact ALL OTHER ROS ARE NEGATIVE      IMAGING    DG Chest Port 1 View Result Date: 01/24/2024 CLINICAL DATA:  Bronchoscopy EXAM: PORTABLE CHEST 1 VIEW COMPARISON:  Chest x-ray performed January 16, 2024 FINDINGS: No pneumothorax. Parenchymal opacity projects over the left mid lung zone. Interstitial airspace opacities are also present. IMPRESSION: 1. No pneumothorax. 2. Parenchymal opacity at the site of suspected bronchoscopy/biopsy. 3. Low lung volumes  with interstitial airspace opacities. Electronically Signed   By: Reagan Camera M.D.   On: 01/24/2024 12:45   DG C-Arm 1-60 Min-No Report Result Date: 01/24/2024 Fluoroscopy was utilized by the requesting physician.  No radiographic interpretation.   MR BRAIN W WO CONTRAST Result Date: 01/17/2024 CLINICAL DATA:  evaluate for metastasis with primary lung neoplasm on ct scan EXAM: MRI HEAD WITHOUT AND WITH CONTRAST TECHNIQUE: Multiplanar, multiecho pulse sequences of the brain and surrounding structures were obtained without and with intravenous contrast. CONTRAST:  7mL GADAVIST  GADOBUTROL  1 MMOL/ML IV SOLN COMPARISON:  None Available. FINDINGS: Brain: No acute infarction, hemorrhage, hydrocephalus, extra-axial collection or mass lesion. Remote lacunar infarcts in the cerebellum bilaterally as well as in the right parietal cortex. Additional scattered T2/FLAIR hyperintensities the white matter are compatible with chronic microvascular ischemic disease. No pathologic enhancement. Vascular: Major arterial flow voids are maintained at the skull base. Skull and upper cervical spine: Normal marrow signal. Sinuses/Orbits: Mild paranasal sinus mucosal thickening. No acute orbital findings. Other: No mastoid effusions. IMPRESSION: 1. No evidence of acute intracranial abnormality or metastatic disease. 2. 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 Electronically Signed   By: Stevenson Elbe M.D.   On: 01/17/2024 19:45   CT CHEST WO CONTRAST Result Date: 01/16/2024 EXAM: CT CHEST WITHOUT CONTRAST 01/16/2024 04:33:00 PM TECHNIQUE: CT of the chest was performed without the administration of intravenous contrast. Multiplanar reformatted images are provided for review. Automated exposure control, iterative reconstruction, and/or weight based adjustment of the mA/kV was utilized to reduce the radiation dose to as low as reasonably achievable. COMPARISON: Chest radiograph earlier today. CLINICAL HISTORY:  Abnormal xray - lung nodule, >= 1 cm. FINDINGS: MEDIASTINUM: Heart and pericardium are unremarkable. The central airways are clear. LYMPH NODES: 14 mm short axis right paratracheal node (image 49) and a 14 mm short axis prevascular node (image 52), suspicious for thoracic nodal metastases. LUNGS AND PLEURA: 3.7 x 1.8 cm lobulated mass in the left upper lobe abutting the chest wall (image 52), suspicious for primary bronchogenic carcinoma. Adjacent satellite nodularity in the left upper lobe. Moderate centrilobular and paraseptal emphysematous changes, upper lung predominant. Lower lobe predominant subpleural reticulation/fibrosis, favoring superimposed chronic lung disease. No pleural effusion or pneumothorax. SOFT TISSUES/BONES: Mild degenerative changes of the  lower thoracic spine. No acute abnormality of the soft tissues. UPPER ABDOMEN: Diffuse parenchymal calcifications along the pancreatic body and tail, reflecting sequela of prior or chronic pancreatitis. IMPRESSION: 1. 3.7 x 1.8 cm lobulated mass in the left upper lobe abutting the chest wall, suspicious for primary bronchogenic carcinoma, with adjacent satellite nodularity. 2. 14 mm short axis right paratracheal node and a 14 mm short axis prevascular node, suspicious for thoracic nodal metastases. Electronically signed by: Zadie Herter MD 01/16/2024 07:54 PM EDT RP Workstation: [id number]   DG Chest 1 View Result Date: 01/16/2024 CLINICAL DATA:  CHF. EXAM: CHEST  1 VIEW COMPARISON:  01/15/2024 FINDINGS: Cardiopericardial silhouette is at upper limits of normal for size. There is pulmonary vascular congestion without overt pulmonary edema. Nodular density projects over the lateral left mid lung, potentially obscured by the cardiac lead on the prior study. No acute bony abnormality. Telemetry leads overlie the chest. IMPRESSION: 1. Nodular density projects over the lateral left mid lung. CT chest without contrast recommended to further evaluate. 2.  Pulmonary vascular congestion without overt airspace edema. Electronically Signed   By: Donnal Fusi M.D.   On: 01/16/2024 09:14   ECHOCARDIOGRAM COMPLETE Result Date: 01/15/2024    ECHOCARDIOGRAM REPORT   Patient Name:   Noah Velasquez Date of Exam: 01/15/2024 Medical Rec #:  [id number]     Height:       67.0 in Accession #:    [id number]    Weight:       174.0 lb Date of Birth:  Nov 20, 1965    BSA:          1.906 m Patient Age:    57 years      BP:           150/100 mmHg Patient Gender: M             HR:           95 bpm. Exam Location:  [facility] Procedure: 2D Echo, Cardiac Doppler and Color Doppler (Both Spectral and Color            Flow Doppler were utilized during procedure). Indications:     CHF I50.31  History:         Patient has no prior history of Echocardiogram examinations.  Sonographer:     Clenton Czech RDCS, FASE Referring Phys:  [id number] [place] Diagnosing Phys: Antonette Batters MD IMPRESSIONS  1. Left ventricular ejection fraction, by estimation, is 50 to 55%. The left ventricle has normal function. The left ventricle has no regional wall motion abnormalities. There is mild left ventricular hypertrophy. Left ventricular diastolic parameters are consistent with Grade I diastolic dysfunction (impaired relaxation).  2. Right ventricular systolic function is normal. The right ventricular size is normal.  3. The mitral valve is normal in structure. Trivial mitral valve regurgitation.  4. The aortic valve is normal in structure. Aortic valve regurgitation is not visualized. Aortic valve sclerosis is present, with no evidence of aortic valve stenosis. FINDINGS  Left Ventricle: Left ventricular ejection fraction, by estimation, is 50 to 55%. The left ventricle has normal function. The left ventricle has no regional wall motion abnormalities. Strain was performed and the global longitudinal strain is indeterminate. Global longitudinal strain performed but not reported based on interpreter judgement due  to suboptimal tracking. The left ventricular internal cavity size was normal in size. There is mild left ventricular hypertrophy. Left ventricular diastolic parameters are consistent with Grade I diastolic dysfunction (impaired relaxation). Right Ventricle:  The right ventricular size is normal. No increase in right ventricular wall thickness. Right ventricular systolic function is normal. Left Atrium: Left atrial size was normal in size. Right Atrium: Right atrial size was normal in size. Pericardium: There is no evidence of pericardial effusion. Mitral Valve: The mitral valve is normal in structure. Trivial mitral valve regurgitation. Tricuspid Valve: The tricuspid valve is normal in structure. Tricuspid valve regurgitation is mild. Aortic Valve: The aortic valve is normal in structure. Aortic valve regurgitation is not visualized. Aortic regurgitation PHT measures 750 msec. Aortic valve sclerosis is present, with no evidence of aortic valve stenosis. Aortic valve mean gradient measures 8.0 mmHg. Aortic valve peak gradient measures 14.7 mmHg. Aortic valve area, by VTI measures 2.40 cm. Pulmonic Valve: The pulmonic valve was normal in structure. Pulmonic valve regurgitation is not visualized. Aorta: The ascending aorta was not well visualized. IAS/Shunts: No atrial level shunt detected by color flow Doppler. Additional Comments: 3D was performed not requiring image post processing on an independent workstation and was indeterminate.  LEFT VENTRICLE PLAX 2D LVIDd:         4.90 cm     Diastology LVIDs:         3.50 cm     LV e' medial:    6.53 cm/s LV PW:         1.30 cm     LV E/e' medial:  13.2 LV IVS:        1.20 cm     LV e' lateral:   11.90 cm/s LVOT diam:     2.20 cm     LV E/e' lateral: 7.3 LV SV:         80 LV SV Index:   42 LVOT Area:     3.80 cm  LV Volumes (MOD) LV vol d, MOD A4C: 87.7 ml LV vol s, MOD A4C: 36.1 ml LV SV MOD A4C:     87.7 ml RIGHT VENTRICLE RV Basal diam:  3.20 cm RV S prime:     12.20  cm/s TAPSE (M-mode): 2.0 cm LEFT ATRIUM             Index        RIGHT ATRIUM           Index LA diam:        3.50 cm 1.84 cm/m   RA Area:     16.40 cm LA Vol (A2C):   63.5 ml 33.32 ml/m  RA Volume:   44.90 ml  23.56 ml/m LA Vol (A4C):   51.0 ml 26.76 ml/m LA Biplane Vol: 57.7 ml 30.27 ml/m  AORTIC VALVE                     PULMONIC VALVE AV Area (Vmax):    2.38 cm      PV Vmax:       1.25 m/s AV Area (Vmean):   2.30 cm      PV Peak grad:  6.2 mmHg AV Area (VTI):     2.40 cm AV Vmax:           191.50 cm/s AV Vmean:          128.000 cm/s AV VTI:            0.334 m AV Peak Grad:      14.7 mmHg AV Mean Grad:      8.0 mmHg LVOT Vmax:         120.00 cm/s LVOT Vmean:  77.400 cm/s LVOT VTI:          0.211 m LVOT/AV VTI ratio: 0.63 AI PHT:            750 msec  AORTA Ao Root diam: 3.80 cm MITRAL VALVE                TRICUSPID VALVE MV Area (PHT): 4.96 cm     TR Peak grad:   36.2 mmHg MV Decel Time: 153 msec     TR Vmax:        301.00 cm/s MV E velocity: 86.50 cm/s MV A velocity: 101.00 cm/s  SHUNTS MV E/A ratio:  0.86         Systemic VTI:  0.21 m                             Systemic Diam: 2.20 cm Antonette Batters MD Electronically signed by Antonette Batters MD Signature Date/Time: 01/15/2024/10:40:32 AM    Final    DG Chest Port 1 View Result Date: 01/15/2024 CLINICAL DATA:  Shortness of breath. History of COPD with inhaler. Decreased oxygen saturation. EXAM: PORTABLE CHEST 1 VIEW COMPARISON:  07/31/2022 FINDINGS: Borderline heart size with mild pulmonary vascular congestion. Diffuse interstitial pattern to the lungs likely representing interstitial edema. Peribronchial thickening consistent with chronic bronchitis. No focal consolidation. No pleural effusion or pneumothorax. Mediastinal contours appear intact. IMPRESSION: Pulmonary vascular congestion with diffuse interstitial pattern to the lungs likely edema. Electronically Signed   By: Boyce Byes M.D.   On: 01/15/2024 00:15       ASSESSMENT/PLAN   58 year old pleasant African-American male with newly diagnosis of small cell lung cancer with underlying COPD extensive smoking history  Assessment of COPD Stable at this time No indication for antibiotics or prednisone  No indication for oxygen at this time Continue to use albuterol  as needed No maintenance therapy at this time Will obtain pulmonary function testing at next office visit  Small cell lung cancer Follow-up with oncology  Avoid Allergens and Irritants Avoid secondhand smoke Avoid SICK contacts Recommend  Masking  when appropriate Recommend Keep up-to-date with vaccinations      CURRENT MEDICATIONS REVIEWED AT LENGTH WITH PATIENT TODAY   Patient  satisfied with Plan of action and management. All questions answered  Follow up 3 months  I spent a total of 48 minutes reviewing chart data, face-to-face evaluation with the patient, counseling and coordination of care as detailed above.     Lady Pier, M.D.  [facility]  Medical Director [facility] Medical Director [facility] Cardio-Pulmonary Department

## 2024-02-01 NOTE — Patient Instructions (Addendum)
 Continue albuterol  inhaler as needed  Avoid Allergens and Irritants Avoid secondhand smoke Avoid SICK contacts Recommend  Masking  when appropriate Recommend Keep up-to-date with vaccinations  Follow-up with oncology as scheduled

## 2024-02-04 ENCOUNTER — Other Ambulatory Visit

## 2024-02-05 NOTE — Progress Notes (Unsigned)
 Hematology/Oncology Consult note [facility] Telephone:([phone] [phone]  Patient Care Team: Antonette Angeline ORN, NP as PCP - General (Internal Medicine) Verdene Gills, RN as Oncology Nurse Navigator   Name of the patient: Noah Velasquez  [id number]  1966-01-23    Reason for referral-new diagnosis of small cell carcinoma of the lung   Referring physician-Dr. Mitzie Piety  Date of visit: 02/05/24   History of presenting illness- Patient is a 58 year old African-American male with a past medical history significant for CHF, interstitial lung disease, type 2 diabetes.  He also has right sided hemiparesis and is mainly wheelchair-bound but lives independently.  He underwent CT chest without contrast when he had a chest x-ray which showed a lung nodule.  CT chest showed a 3.7 x 1.8 cm lobulated mass in the left upper lobe abutting the chest wall concerning for primary bronchogenic carcinoma.  Adjacent satellite nodularity in the left upper lobe.  14 mm right paratracheal node and 14 mm short axis prevascular node.  Moderate centrilobular and paraseptal emphysematous changes.  MRI brain with and without contrast showed remote infarcts and chronic microvascular disease but no evidence of acute intracranial abnormality.  Patient was seen by Dr. Isaiah and underwent bronchoscopy.  Left upper lobe lung biopsy was consistent with poorly differentiated carcinoma consistent with small cell carcinoma.  Tumor was positive for TTF-1 and neuroendocrine markers synaptophysin.  Negative for Napsin A.  Negative for squamous cell marker p40.FNA of the right paratracheal adenopathy was done and was negative for malignancy but left-sided mediastinal and hilar lymph nodes were not sampled at the time of bronchoscopy.  He uses 2 L of oxygen mainly at night but not during the day  ECOG PS- 2  Pain scale- 0   Review of systems- Review of Systems  Constitutional:  Negative for  chills, fever, malaise/fatigue and weight loss.  HENT:  Negative for congestion, ear discharge and nosebleeds.   Eyes:  Negative for blurred vision.  Respiratory:  Negative for cough, hemoptysis, sputum production, shortness of breath and wheezing.   Cardiovascular:  Negative for chest pain, palpitations, orthopnea and claudication.  Gastrointestinal:  Negative for abdominal pain, blood in stool, constipation, diarrhea, heartburn, melena, nausea and vomiting.  Genitourinary:  Negative for dysuria, flank pain, frequency, hematuria and urgency.  Musculoskeletal:  Negative for back pain, joint pain and myalgias.  Skin:  Negative for rash.  Neurological:  Negative for dizziness, tingling, focal weakness, seizures, weakness and headaches.  Endo/Heme/Allergies:  Does not bruise/bleed easily.  Psychiatric/Behavioral:  Negative for depression and suicidal ideas. The patient does not have insomnia.     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/2024   Pulmonary vascular congestion 01/16/2024   Hypoxia 01/16/2024   COPD exacerbation (HCC) 01/15/2024   Acute on chronic diastolic CHF (congestive heart failure) (HCC) 01/15/2024   CHF (congestive heart failure) (HCC) 01/15/2024   Type 2 diabetes mellitus with diabetic mononeuropathy, without long-term current use of insulin  (HCC) 09/07/2023   Overweight with body mass index (BMI) of 28 to 28.9 in adult 04/07/2023   ILD (interstitial lung disease) (HCC) 06/01/2022   Aortic atherosclerosis (HCC) 12/10/2021   Hyperlipidemia associated with type 2 diabetes mellitus (HCC) 01/26/2021   GERD (gastroesophageal reflux disease) 01/26/2021   Chronic pancreatitis (HCC) 01/26/2021   Erectile dysfunction  01/10/2020   Anxiety and depression 06/05/2019   Alcohol  use  disorder, moderate, in sustained remission (HCC) 04/06/2019   Insomnia due to mental condition 08/28/2015   Essential hypertension 02/15/2015   Osteoarthritis 08/01/2012   PTSD (post-traumatic stress disorder) 06/12/2009     Past Medical History:  Diagnosis Date   Alcohol  use disorder    Aortic atherosclerosis (HCC)    Bronchiolo-alveolar adenocarcinoma of left lung (HCC)    CHF (congestive heart failure) (HCC)    Chronic hip pain    Chronic pancreatitis (HCC)    Cigarette smoker    CKD (chronic kidney disease) stage 3, GFR 30-59 ml/min (HCC)    COPD (chronic obstructive pulmonary disease) (HCC)    Crack cocaine use    as of 01-21-24, pt states he last used a few weeks ago   Depression    DM (diabetes mellitus), type 2 (HCC)    Dyspnea    ED (erectile dysfunction)    ESBL (extended spectrum beta-lactamase) producing bacteria infection 2021   GERD (gastroesophageal reflux disease)    Hyperlipidemia    Hypertension    ILD (interstitial lung disease) (HCC)    Marijuana use    Osteoarthritis    Pneumonia    PTSD (post-traumatic stress disorder)    Seizure (HCC)    x1 years ago   Viral infection of left eye      Past Surgical History:  Procedure Laterality Date   BRONCHOSCOPY, WITH BIOPSY USING ELECTROMAGNETIC NAVIGATION Left 01/24/2024   Procedure: BRONCHOSCOPY, WITH BIOPSY USING ELECTROMAGNETIC NAVIGATION;  Surgeon: Isaiah Scrivener, MD;  Location: [facility] ORS;  Service: Pulmonary;  Laterality: Left;   ENDOBRONCHIAL ULTRASOUND Right 01/24/2024   Procedure: ENDOBRONCHIAL ULTRASOUND (EBUS);  Surgeon: Kasa, Kurian, MD;  Location: [facility] ORS;  Service: Pulmonary;  Laterality: Right;   pancreatic stent placement      Social History   Socioeconomic History   Marital status: Legally Separated    Spouse name: Not on file   Number of children: 2   Years of education: Not on file   Highest education level: High school graduate  Occupational History   Occupation: on disability   Tobacco Use   Smoking status: Former    Current packs/day: 3.00    Average packs/day: 3.0 packs/day for 15.0 years (45.0 ttl pk-yrs)    Types: Cigarettes   Smokeless tobacco: Never   Tobacco comments:    At his heaviest smoked 3ppd and now occasionally smoke 1 cigarette. 02/01/24  Vaping Use   Vaping status: Some Days  Substance and Sexual Activity   Alcohol  use: Yes    Alcohol /week: 7.0 standard drinks of alcohol     Types: 7 Cans of beer per week    Comment: occ   Drug use: Yes    Types: Marijuana, Crack cocaine    Comment: Marijuana every day. 7-76yrs clean of crack cocaine.   Sexual activity: Yes    Birth control/protection: Condom  Other Topics Concern   Not on file  Social History Narrative   Not on file   Social Drivers of Health   Financial Resource Strain: Not on file  Food Insecurity: No Food Insecurity (01/31/2024)   Hunger Vital Sign    Worried About Running Out of Food in the Last Year: Never true    Ran Out of Food in the Last Year: Never true  Recent Concern: Food Insecurity - Food Insecurity Present (01/15/2024)   Hunger Vital Sign    Worried About Running Out of Food in the  Last Year: Sometimes true    Ran Out of Food in the Last Year: Sometimes true  Transportation Needs: No Transportation Needs (01/31/2024)   PRAPARE - Administrator, Civil Service (Medical): No    Lack of Transportation (Non-Medical): No  Physical Activity: Not on file  Stress: Not on file  Social Connections: Not on file  Intimate Partner Violence: Not At Risk (01/31/2024)   Humiliation, Afraid, Rape, and Kick questionnaire    Fear of Current or Ex-Partner: No    Emotionally Abused: No    Physically Abused: No    Sexually Abused: No     Family History  Problem Relation Age of Onset   Diabetes Mother    Sleep apnea Mother    Emphysema Sister    CAD Neg Hx    Mental illness Neg Hx      Current Outpatient Medications:    Accu-Chek Softclix Lancets lancets, 1 each  in the morning, at noon, and at bedtime. May substitute to any manufacturer covered by patient's insurance., Disp: 100 each, Rfl: 0   amLODipine -olmesartan  (AZOR ) 10-40 MG tablet, Take 1 tablet by mouth daily., Disp: 90 tablet, Rfl: 1   Blood Glucose Monitoring Suppl (BLOOD GLUCOSE MONITOR SYSTEM) w/Device KIT, 1 each by Does not apply route in the morning, at noon, and at bedtime. May substitute to any manufacturer covered by patient's insurance., Disp: 1 kit, Rfl: 0   empagliflozin  (JARDIANCE ) 10 MG TABS tablet, Take 1 tablet (10 mg total) by mouth daily before breakfast., Disp: , Rfl:    Glucose Blood (BLOOD GLUCOSE TEST STRIPS) STRP, 1 each by In Vitro route in the morning, at noon, and at bedtime. May substitute to any manufacturer covered by patient's insurance., Disp: 100 strip, Rfl: 0   Lancet Device MISC, 1 each by Does not apply route in the morning, at noon, and at bedtime. May substitute to any manufacturer covered by patient's insurance., Disp: 1 each, Rfl: 0   lovastatin  (MEVACOR ) 10 MG tablet, Take 1 tablet (10 mg total) by mouth at bedtime., Disp: 90 tablet, Rfl: 1   metFORMIN  (GLUCOPHAGE ) 500 MG tablet, Take 1 tablet (500 mg total) by mouth daily with breakfast., Disp: 90 tablet, Rfl: 1   naproxen  (NAPROSYN ) 250 MG tablet, Take 250 mg by mouth 2 (two) times daily with a meal., Disp: , Rfl:    Nebulizers (COMPRESSOR/NEBULIZER) MISC, Nebulizer and tubes/mask to use as directed with DuoNeb as needed, Disp: 1 each, Rfl: 0   nicotine  (NICODERM CQ  - DOSED IN MG/24 HOURS) 21 mg/24hr patch, Place 1 patch (21 mg total) onto the skin daily., Disp: 28 patch, Rfl: 0   omeprazole  (PRILOSEC) 20 MG capsule, Take 1 capsule (20 mg total) by mouth daily., Disp: 90 capsule, Rfl: 1   OXYGEN, Inhale 2 L into the lungs as needed., Disp: , Rfl:    Pancrelipase , Lip-Prot-Amyl, (ZENPEP ) 15000-47000 units CPEP, Take 1 capsule (15,000 Units total) by mouth 3 (three) times daily., Disp: 270 capsule, Rfl: 1    traZODone  (DESYREL ) 150 MG tablet, Take 1 tablet (150 mg total) by mouth at bedtime as needed for sleep., Disp: 90 tablet, Rfl: 1   VENTOLIN  HFA 108 (90 Base) MCG/ACT inhaler, Inhale 1-2 puffs into the lungs every 6 (six) hours as needed., Disp: , Rfl:    Physical exam:  Vitals:   01/31/24 1430  BP: (!) 118/98  Pulse: (!) 152  Resp: 19  Temp: 97.8 F (36.6 C)  SpO2: 99%  Weight:  169 lb 11.2 oz (77 kg)   Physical Exam  Cardiovascular:     Rate and Rhythm: Normal rate and regular rhythm.     Heart sounds: Normal heart sounds.  Pulmonary:     Effort: Pulmonary effort is normal.     Breath sounds: Normal breath sounds.  Abdominal:     General: Bowel sounds are normal.     Palpations: Abdomen is soft.   Skin:    General: Skin is warm and dry.   Neurological:     Mental Status: He is alert and oriented to person, place, and time.     Comments: Right-sided hemiparesis          Latest Ref Rng & Units 01/17/2024   10:43 AM  CMP  Glucose 70 - 99 mg/dL 827   BUN 6 - 20 mg/dL 20   Creatinine 9.38 - 1.24 mg/dL 8.94   Sodium 864 - 854 mmol/L 133   Potassium 3.5 - 5.1 mmol/L 3.5   Chloride 98 - 111 mmol/L 97   CO2 22 - 32 mmol/L 25   Calcium  8.9 - 10.3 mg/dL 8.6       Latest Ref Rng & Units 01/16/2024    7:02 AM  CBC  WBC 4.0 - 10.5 K/uL 10.7   Hemoglobin 13.0 - 17.0 g/dL 85.5   Hematocrit 60.9 - 52.0 % 41.2   Platelets 150 - 400 K/uL 267     No images are attached to the encounter.  DG Chest Port 1 View Result Date: 01/24/2024 CLINICAL DATA:  Bronchoscopy EXAM: PORTABLE CHEST 1 VIEW COMPARISON:  Chest x-ray performed January 16, 2024 FINDINGS: No pneumothorax. Parenchymal opacity projects over the left mid lung zone. Interstitial airspace opacities are also present. IMPRESSION: 1. No pneumothorax. 2. Parenchymal opacity at the site of suspected bronchoscopy/biopsy. 3. Low lung volumes with interstitial airspace opacities. Electronically Signed   By: Maude Naegeli M.D.   On:  01/24/2024 12:45   DG C-Arm 1-60 Min-No Report Result Date: 01/24/2024 Fluoroscopy was utilized by the requesting physician.  No radiographic interpretation.   MR BRAIN W WO CONTRAST Result Date: 01/17/2024 CLINICAL DATA:  evaluate for metastasis with primary lung neoplasm on ct scan EXAM: MRI HEAD WITHOUT AND WITH CONTRAST TECHNIQUE: Multiplanar, multiecho pulse sequences of the brain and surrounding structures were obtained without and with intravenous contrast. CONTRAST:  7mL GADAVIST  GADOBUTROL  1 MMOL/ML IV SOLN COMPARISON:  None Available. FINDINGS: Brain: No acute infarction, hemorrhage, hydrocephalus, extra-axial collection or mass lesion. Remote lacunar infarcts in the cerebellum bilaterally as well as in the right parietal cortex. Additional scattered T2/FLAIR hyperintensities the white matter are compatible with chronic microvascular ischemic disease. No pathologic enhancement. Vascular: Major arterial flow voids are maintained at the skull base. Skull and upper cervical spine: Normal marrow signal. Sinuses/Orbits: Mild paranasal sinus mucosal thickening. No acute orbital findings. Other: No mastoid effusions. IMPRESSION: 1. No evidence of acute intracranial abnormality or metastatic disease. 2. 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 Electronically Signed   By: Gilmore GORMAN Molt M.D.   On: 01/17/2024 19:45   CT CHEST WO CONTRAST Result Date: 01/16/2024 EXAM: CT CHEST WITHOUT CONTRAST 01/16/2024 04:33:00 PM TECHNIQUE: CT of the chest was performed without the administration of intravenous contrast. Multiplanar reformatted images are provided for review. Automated exposure control, iterative reconstruction, and/or weight based adjustment of the mA/kV was utilized to reduce the radiation dose to as low as reasonably achievable. COMPARISON: Chest radiograph earlier today. CLINICAL HISTORY: Abnormal xray - lung  nodule, >= 1 cm. FINDINGS: MEDIASTINUM: Heart and pericardium are  unremarkable. The central airways are clear. LYMPH NODES: 14 mm short axis right paratracheal node (image 49) and a 14 mm short axis prevascular node (image 52), suspicious for thoracic nodal metastases. LUNGS AND PLEURA: 3.7 x 1.8 cm lobulated mass in the left upper lobe abutting the chest wall (image 52), suspicious for primary bronchogenic carcinoma. Adjacent satellite nodularity in the left upper lobe. Moderate centrilobular and paraseptal emphysematous changes, upper lung predominant. Lower lobe predominant subpleural reticulation/fibrosis, favoring superimposed chronic lung disease. No pleural effusion or pneumothorax. SOFT TISSUES/BONES: Mild degenerative changes of the lower thoracic spine. No acute abnormality of the soft tissues. UPPER ABDOMEN: Diffuse parenchymal calcifications along the pancreatic body and tail, reflecting sequela of prior or chronic pancreatitis. IMPRESSION: 1. 3.7 x 1.8 cm lobulated mass in the left upper lobe abutting the chest wall, suspicious for primary bronchogenic carcinoma, with adjacent satellite nodularity. 2. 14 mm short axis right paratracheal node and a 14 mm short axis prevascular node, suspicious for thoracic nodal metastases. Electronically signed by: Pinkie Pebbles MD 01/16/2024 07:54 PM EDT RP Workstation: [id number]   DG Chest 1 View Result Date: 01/16/2024 CLINICAL DATA:  CHF. EXAM: CHEST  1 VIEW COMPARISON:  01/15/2024 FINDINGS: Cardiopericardial silhouette is at upper limits of normal for size. There is pulmonary vascular congestion without overt pulmonary edema. Nodular density projects over the lateral left mid lung, potentially obscured by the cardiac lead on the prior study. No acute bony abnormality. Telemetry leads overlie the chest. IMPRESSION: 1. Nodular density projects over the lateral left mid lung. CT chest without contrast recommended to further evaluate. 2. Pulmonary vascular congestion without overt airspace edema. Electronically Signed   By:  Camellia Candle M.D.   On: 01/16/2024 09:14   ECHOCARDIOGRAM COMPLETE Result Date: 01/15/2024    ECHOCARDIOGRAM REPORT   Patient Name:   JAJUAN SKOOG Date of Exam: 01/15/2024 Medical Rec #:  [id number]     Height:       67.0 in Accession #:    [id number]    Weight:       174.0 lb Date of Birth:  04-22-1966    BSA:          1.906 m Patient Age:    57 years      BP:           150/100 mmHg Patient Gender: M             HR:           95 bpm. Exam Location:  [facility] Procedure: 2D Echo, Cardiac Doppler and Color Doppler (Both Spectral and Color            Flow Doppler were utilized during procedure). Indications:     CHF I50.31  History:         Patient has no prior history of Echocardiogram examinations.  Sonographer:     Thedora Louder RDCS, FASE Referring Phys:  [id number] CORT ONEIDA MANA Diagnosing Phys: Cara JONETTA Lovelace MD IMPRESSIONS  1. Left ventricular ejection fraction, by estimation, is 50 to 55%. The left ventricle has normal function. The left ventricle has no regional wall motion abnormalities. There is mild left ventricular hypertrophy. Left ventricular diastolic parameters are consistent with Grade I diastolic dysfunction (impaired relaxation).  2. Right ventricular systolic function is normal. The right ventricular size is normal.  3. The mitral valve is normal in structure. Trivial mitral valve regurgitation.  4. The aortic valve is  normal in structure. Aortic valve regurgitation is not visualized. Aortic valve sclerosis is present, with no evidence of aortic valve stenosis. FINDINGS  Left Ventricle: Left ventricular ejection fraction, by estimation, is 50 to 55%. The left ventricle has normal function. The left ventricle has no regional wall motion abnormalities. Strain was performed and the global longitudinal strain is indeterminate. Global longitudinal strain performed but not reported based on interpreter judgement due to suboptimal tracking. The left ventricular internal cavity size was normal in size.  There is mild left ventricular hypertrophy. Left ventricular diastolic parameters are consistent with Grade I diastolic dysfunction (impaired relaxation). Right Ventricle: The right ventricular size is normal. No increase in right ventricular wall thickness. Right ventricular systolic function is normal. Left Atrium: Left atrial size was normal in size. Right Atrium: Right atrial size was normal in size. Pericardium: There is no evidence of pericardial effusion. Mitral Valve: The mitral valve is normal in structure. Trivial mitral valve regurgitation. Tricuspid Valve: The tricuspid valve is normal in structure. Tricuspid valve regurgitation is mild. Aortic Valve: The aortic valve is normal in structure. Aortic valve regurgitation is not visualized. Aortic regurgitation PHT measures 750 msec. Aortic valve sclerosis is present, with no evidence of aortic valve stenosis. Aortic valve mean gradient measures 8.0 mmHg. Aortic valve peak gradient measures 14.7 mmHg. Aortic valve area, by VTI measures 2.40 cm. Pulmonic Valve: The pulmonic valve was normal in structure. Pulmonic valve regurgitation is not visualized. Aorta: The ascending aorta was not well visualized. IAS/Shunts: No atrial level shunt detected by color flow Doppler. Additional Comments: 3D was performed not requiring image post processing on an independent workstation and was indeterminate.  LEFT VENTRICLE PLAX 2D LVIDd:         4.90 cm     Diastology LVIDs:         3.50 cm     LV e' medial:    6.53 cm/s LV PW:         1.30 cm     LV E/e' medial:  13.2 LV IVS:        1.20 cm     LV e' lateral:   11.90 cm/s LVOT diam:     2.20 cm     LV E/e' lateral: 7.3 LV SV:         80 LV SV Index:   42 LVOT Area:     3.80 cm  LV Volumes (MOD) LV vol d, MOD A4C: 87.7 ml LV vol s, MOD A4C: 36.1 ml LV SV MOD A4C:     87.7 ml RIGHT VENTRICLE RV Basal diam:  3.20 cm RV S prime:     12.20 cm/s TAPSE (M-mode): 2.0 cm LEFT ATRIUM             Index        RIGHT ATRIUM            Index LA diam:        3.50 cm 1.84 cm/m   RA Area:     16.40 cm LA Vol (A2C):   63.5 ml 33.32 ml/m  RA Volume:   44.90 ml  23.56 ml/m LA Vol (A4C):   51.0 ml 26.76 ml/m LA Biplane Vol: 57.7 ml 30.27 ml/m  AORTIC VALVE                     PULMONIC VALVE AV Area (Vmax):    2.38 cm      PV Vmax:  1.25 m/s AV Area (Vmean):   2.30 cm      PV Peak grad:  6.2 mmHg AV Area (VTI):     2.40 cm AV Vmax:           191.50 cm/s AV Vmean:          128.000 cm/s AV VTI:            0.334 m AV Peak Grad:      14.7 mmHg AV Mean Grad:      8.0 mmHg LVOT Vmax:         120.00 cm/s LVOT Vmean:        77.400 cm/s LVOT VTI:          0.211 m LVOT/AV VTI ratio: 0.63 AI PHT:            750 msec  AORTA Ao Root diam: 3.80 cm MITRAL VALVE                TRICUSPID VALVE MV Area (PHT): 4.96 cm     TR Peak grad:   36.2 mmHg MV Decel Time: 153 msec     TR Vmax:        301.00 cm/s MV E velocity: 86.50 cm/s MV A velocity: 101.00 cm/s  SHUNTS MV E/A ratio:  0.86         Systemic VTI:  0.21 m                             Systemic Diam: 2.20 cm Cara JONETTA Lovelace MD Electronically signed by Cara JONETTA Lovelace MD Signature Date/Time: 01/15/2024/10:40:32 AM    Final    DG Chest Port 1 View Result Date: 01/15/2024 CLINICAL DATA:  Shortness of breath. History of COPD with inhaler. Decreased oxygen saturation. EXAM: PORTABLE CHEST 1 VIEW COMPARISON:  07/31/2022 FINDINGS: Borderline heart size with mild pulmonary vascular congestion. Diffuse interstitial pattern to the lungs likely representing interstitial edema. Peribronchial thickening consistent with chronic bronchitis. No focal consolidation. No pleural effusion or pneumothorax. Mediastinal contours appear intact. IMPRESSION: Pulmonary vascular congestion with diffuse interstitial pattern to the lungs likely edema. Electronically Signed   By: Elsie Gravely M.D.   On: 01/15/2024 00:15    Assessment and plan- Patient is a 58 y.o. male with new diagnosis of small cell carcinoma  I have  reviewed CT chest images independently and discussed findings with the Patient.  Left upper lobe lung mass measuring 3.8 cm abutting the chest wall.  There was concern for 14 mm right paratracheal adenopathy which was sampled and was negative for malignancy but the left upper lobe lung mass is consistent with small cell lung cancer.  Left mediastinal/hilar lymph nodes were not sampled at the time of bronchoscopy. I would like to get PET/CT to get a complete staging work up.  MRI brain was negative for metastatic disease.  Even if there is no evidence of local regional adenopathy patient does not wish to proceed with any surgical intervention for his left upper lobe lung mass.  Given that he has small cell etiology he would benefit from systemic chemotherapy with cisplatin and etoposide for 4 cycles regardless of locoregional lymph node involvement.  He is going to likely benefit from concurrent radiation with the duration of radiation treatment would depend on findings of PET scan.  I will tentatively see him back on 02/11/2024 to discuss the results of PET scan and further management.   Thank you  for this kind referral and the opportunity to participate in the care of this  Patient   Visit Diagnosis 1. Small cell lung cancer, left upper lobe (HCC)     Dr. Annah Skene, MD, MPH [facility] at [facility] [phone] 02/05/2024

## 2024-02-08 ENCOUNTER — Ambulatory Visit
Admission: RE | Admit: 2024-02-08 | Discharge: 2024-02-08 | Disposition: A | Discharge status: Home / Self Care | Source: Ambulatory Visit | Attending: Oncology | Admitting: Oncology

## 2024-02-08 DIAGNOSIS — R918 Other nonspecific abnormal finding of lung field: Secondary | ICD-10-CM

## 2024-02-09 ENCOUNTER — Other Ambulatory Visit: Payer: Self-pay | Admitting: Internal Medicine

## 2024-02-09 ENCOUNTER — Telehealth: Payer: Self-pay

## 2024-02-09 ENCOUNTER — Telehealth: Payer: Self-pay | Admitting: Internal Medicine

## 2024-02-09 ENCOUNTER — Other Ambulatory Visit: Payer: Self-pay

## 2024-02-09 MED ORDER — SERTRALINE HCL 100 MG PO TABS
100.0000 mg | ORAL_TABLET | Freq: Every day | ORAL | 0 refills | Status: DC
  Filled 2024-02-09 – 2024-02-11 (×2): qty 90, 90d supply, fill #0

## 2024-02-09 NOTE — Addendum Note (Signed)
 Addended by: ANTONETTE ANGELINE ORN on: 02/09/2024 11:31 AM   Modules accepted: Orders

## 2024-02-09 NOTE — Telephone Encounter (Unsigned)
 Copied from CRM [id number]. Topic: Clinical - Medication Refill >> Feb 09, 2024 11:06 AM Jakyia R wrote: Medication:empagliflozin  (JARDIANCE ) 10 MG TABS tablet   Has the patient contacted their pharmacy? Yes, no refills  (Agent: If no, request that the patient contact the pharmacy for the refill. If patient does not wish to contact the pharmacy document the reason why and proceed with request.) (Agent: If yes, when and what did the pharmacy advise?)  This is the patient's preferred pharmacy:   [facility] - [place], [place] - [address] [place] [address] Phone: [phone] Fax: [phone]  Is this the correct pharmacy for this prescription? Yes If no, delete pharmacy and type the correct one.   Has the prescription been filled recently? Yes  Is the patient out of the medication? Yes  Has the patient been seen for an appointment in the last year OR does the patient have an upcoming appointment? Yes  Can we respond through MyChart? No  Agent: Please be advised that Rx refills may take up to 3 business days. We ask that you follow-up with your pharmacy.

## 2024-02-09 NOTE — Telephone Encounter (Signed)
 Copied from CRM [id number]. Topic: Clinical - Medication Question >> Feb 09, 2024 11:14 AM Winona R wrote: *sertraline   I do not see this medication listed on his list but he states he needs it refilled** Please verify if this is an existing medication for the pt.

## 2024-02-09 NOTE — Telephone Encounter (Signed)
Sertraline sent to pharmacy. 

## 2024-02-10 NOTE — Telephone Encounter (Signed)
 Requested Prescriptions  Refused Prescriptions Disp Refills   sertraline  (ZOLOFT ) 100 MG tablet [Pharmacy Med Name: SERTRALINE  HCL 100 MG TAB] 90 tablet     Sig: TAKE 1 TABLET BY MOUTH ONCE DAILY     Psychiatry:  Antidepressants - SSRI - sertraline  Passed - 02/10/2024  3:09 PM      Passed - AST in normal range and within 360 days    AST  Date Value Ref Range Status  01/15/2024 25 15 - 41 U/L Final         Passed - ALT in normal range and within 360 days    ALT  Date Value Ref Range Status  01/15/2024 15 0 - 44 U/L Final         Passed - Completed PHQ-2 or PHQ-9 in the last 360 days      Passed - Valid encounter within last 6 months    Recent Outpatient Visits           2 months ago Type 2 diabetes mellitus with diabetic mononeuropathy, without long-term current use of insulin  [facility])   [facility] [place], Angeline ORN, NP       Future Appointments             In 2 weeks Antonette, Angeline ORN, NP [facility]

## 2024-02-11 ENCOUNTER — Inpatient Hospital Stay: Admitting: Oncology

## 2024-02-11 ENCOUNTER — Other Ambulatory Visit: Payer: Self-pay

## 2024-02-11 ENCOUNTER — Other Ambulatory Visit (HOSPITAL_COMMUNITY): Payer: Self-pay

## 2024-02-11 ENCOUNTER — Ambulatory Visit: Payer: Self-pay | Admitting: Internal Medicine

## 2024-02-11 ENCOUNTER — Telehealth: Payer: Self-pay

## 2024-02-11 NOTE — Telephone Encounter (Signed)
 Reason for Disposition . Third attempt to contact caller AND no contact made. Phone number verified. . Message left on identified voice mail  Protocols used: No Contact or Duplicate Contact Call-A-AH

## 2024-02-11 NOTE — Telephone Encounter (Signed)
 Patient was transferred from agent as he stated he needed help with his prescription. Patient sees Dr. Isaiah. Patient did not give agent any other information. Upon warm transfer, patient had hung up. This RN made first attempt to reach patient with no answer. A voicemail was left with call back number provided.

## 2024-02-11 NOTE — Telephone Encounter (Addendum)
 3rd attempted, routing for no contact follow up.   2nd attempt. [email or web address]

## 2024-02-11 NOTE — Telephone Encounter (Signed)
 Spoke with patient, he was concerned that his setraline was not on his med list, he stated that whatever Dumbass said it was not on his med list doesn't need to be in the position they are in. Informed patient that this was refilled and sent to [facility], He stated why was it sent there it should have went to [facility].     Me: Im not sure why it was sent there but it was refilled there.   He then proceeded to say he needed lots of other things refilled very quickly.    Me: which medication was that?   He hung up the phone.

## 2024-02-14 ENCOUNTER — Telehealth: Payer: Self-pay | Admitting: Family

## 2024-02-14 NOTE — Telephone Encounter (Signed)
 Called to confirm/remind patient of their appointment at [facility] on [facility]/15/24.   Appointment:   [x] Confirmed  [] Left mess   [] No answer/No voice mail  [] VM Full/unable to leave message  [] Phone not in service  Patient reminded to bring all medications and/or complete list.  Confirmed patient has transportation. Gave directions, instructed to utilize valet parking.

## 2024-02-14 NOTE — Progress Notes (Deleted)
 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   Admitted 01/14/24 with wheezing and shortness of breath. On admission, BNP was 317.8, HS-troponin was 26 > 23, UDS positive for cocaine, and TSH was 1.165. Chest x-ray noted pulmonary vascular congestion without airspace edema. 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 Echocardiogram 01/15/24 noted LVEFof 50-55%, mild LVH, grade I diastolic dysfunction. Initially needed oxygen  at 2L and unable to be weaned off of it. Brain MRI negative. Bronchoscopy and biopsy to be done as outpatient.   Seen in [facility] 06/25 where jardiance  was started.   He presents today for a HF follow-up visit with a chief complaint of   Smoking ~ 3 cigarettes daily, 80 oz beer every other day, marijuana use   ROS: All systems negative except what is listed in HPI, PMH and Problem List   Past Medical History:  Diagnosis Date   Alcohol  use disorder    Aortic atherosclerosis (HCC)    Bronchiolo-alveolar adenocarcinoma of left lung (HCC)    CHF (congestive heart failure) (HCC)    Chronic hip pain    Chronic pancreatitis (HCC)    Cigarette smoker    CKD (chronic kidney disease) stage 3, GFR 30-59 ml/min (HCC)    COPD (chronic obstructive pulmonary disease) (HCC)    Crack cocaine use    as of 01-21-24, pt states he last used a few weeks ago   Depression    DM (diabetes mellitus), type 2 (HCC)    Dyspnea    ED (erectile dysfunction)    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 May substitute to any manufacturer covered by patient's insurance. 100 each 0   amLODipine -olmesartan  (AZOR ) 10-40 MG tablet Take 1 tablet by mouth daily. 90 tablet 1   Blood Glucose Monitoring Suppl (BLOOD GLUCOSE MONITOR SYSTEM) w/Device KIT 1 each by Does not apply route in the morning, at noon, and at bedtime. May substitute to any manufacturer covered by patient's insurance. 1 kit 0   empagliflozin  (JARDIANCE ) 10 MG TABS tablet Take 1 tablet (10 mg total) by mouth daily before breakfast.     Glucose Blood (BLOOD GLUCOSE TEST STRIPS) STRP 1 each by In Vitro route in the morning, at noon, and at bedtime. May substitute to any manufacturer covered by patient's insurance. 100 strip 0   Lancet Device MISC 1 each by Does not apply route in the morning, at noon, and at bedtime. May substitute to any manufacturer covered by patient's insurance. 1 each 0   lovastatin  (MEVACOR ) 10 MG tablet Take 1 tablet (10 mg total) by mouth at bedtime. 90 tablet 1   metFORMIN  (GLUCOPHAGE ) 500 MG  tablet Take 1 tablet (500 mg total) by mouth daily with breakfast. 90 tablet 1   naproxen  (NAPROSYN ) 250 MG tablet Take 250 mg by mouth 2 (two) times daily with a meal.     Nebulizers (COMPRESSOR/NEBULIZER) MISC Nebulizer and tubes/mask to use as directed with DuoNeb as needed 1 each 0   nicotine  (NICODERM CQ  - DOSED IN MG/24 HOURS) 21 mg/24hr patch Place 1 patch (21 mg total) onto the skin daily. 28 patch 0   omeprazole  (PRILOSEC) 20 MG capsule Take 1 capsule (20 mg total) by mouth daily. 90 capsule 1   OXYGEN  Inhale 2 L into the lungs as needed.     Pancrelipase , Lip-Prot-Amyl,  (ZENPEP ) 15000-47000 units CPEP Take 1 capsule (15,000 Units total) by mouth 3 (three) times daily. 270 capsule 1   sertraline  (ZOLOFT ) 100 MG tablet Take 1 tablet (100 mg total) by mouth daily. 90 tablet 0   traZODone  (DESYREL ) 150 MG tablet Take 1 tablet (150 mg total) by mouth at bedtime as needed for sleep. 90 tablet 1   VENTOLIN  HFA 108 (90 Base) MCG/ACT inhaler Inhale 1-2 puffs into the lungs every 6 (six) hours as needed.     No current facility-administered medications for this visit.    Allergies  Allergen Reactions   Hctz [Hydrochlorothiazide] Swelling      Social History   Socioeconomic History   Marital status: Legally Separated    Spouse name: Not on file   Number of children: 2   Years of education: Not on file   Highest education level: High school graduate  Occupational History   Occupation: on disability  Tobacco Use   Smoking status: Former    Current packs/day: 3.00    Average packs/day: 3.0 packs/day for 15.0 years (45.0 ttl pk-yrs)    Types: Cigarettes   Smokeless tobacco: Never   Tobacco comments:    At his heaviest smoked 3ppd and now occasionally smoke 1 cigarette. 02/01/24  Vaping Use   Vaping status: Some Days  Substance and Sexual Activity   Alcohol  use: Yes    Alcohol /week: 7.0 standard drinks of alcohol     Types: 7 Cans of beer per week    Comment: occ   Drug use: Yes    Types: Marijuana, Crack cocaine    Comment: Marijuana every day. 7-38yrs clean of crack cocaine.   Sexual activity: Yes    Birth control/protection: Condom  Other Topics Concern   Not on file  Social History Narrative   Not on file   Social Drivers of Health   Financial Resource Strain: Not on file  Food Insecurity: No Food Insecurity (01/31/2024)   Hunger Vital Sign    Worried About Running Out of Food in the Last Year: Never true    Ran Out of Food in the Last Year: Never true  Recent Concern: Food Insecurity - Food Insecurity Present (01/15/2024)   Hunger Vital  Sign    Worried About Running Out of Food in the Last Year: Sometimes true    Ran Out of Food in the Last Year: Sometimes true  Transportation Needs: No Transportation Needs (01/31/2024)   PRAPARE - Administrator, Civil Service (Medical): No    Lack of Transportation (Non-Medical): No  Physical Activity: Not on file  Stress: Not on file  Social Connections: Not on file  Intimate Partner Violence: Not At Risk (01/31/2024)   Humiliation, Afraid, Rape, and Kick questionnaire    Fear of Current or Ex-Partner: No  Emotionally Abused: No    Physically Abused: No    Sexually Abused: No      Family History  Problem Relation Age of Onset   Diabetes Mother    Sleep apnea Mother    Emphysema Sister    CAD Neg Hx    Mental illness Neg Hx       PHYSICAL EXAM:  General: Well appearing in wheelchair. No resp difficulty HEENT: normal Neck: supple, no JVD Cor: Regular rhythm, rate. No rubs, gallops or murmurs Lungs: clear Abdomen: soft, nontender, nondistended. Extremities: no cyanosis, clubbing, rash, trace pitting edema bilateral lower legs Neuro: alert & oriented X 3. Moves all 4 extremities w/o difficulty. Affect pleasant   ECG: not done   ASSESSMENT & PLAN:  1: NICM with preserved ejection fraction- - suspect due to HTN - NYHA class II - euvolemic - instructed to start weighing daily and parameters to call about discussed - weight 177.2 from last visit here 3 weeks ago - Echo 01/15/24: LVEFof 50-55%, mild LVH, G1DD - continue amlodipine -olmesartan  10/40mg  daily for now - continue jardiance  10mg  daily; BMET today  - BNP 01/15/24 was 317.8  2: HTN- - BP  - saw PCP Georgina) - BMET 01/17/24 reviewed: sodium 133, potassium 3.5, creatinine 1.05 & GFR >60 - BMET today  3: Lung cancer left upper lobe- - new diagnosis 05/25 - had bronchoscopy/ biopsy done 01/24/24 - saw pulmonology (Kasa) 06/25 - PET scan to be done 02/16/24  4: DM- - A1c 12/07/23 was 6.5%  5:  Substance use- - smoking 3 cigarettes daily - drinking 80 oz beer every other day - + marijuana - cessation discussed   6: Hyperlipidemia- - continue lovastatin  10mg  daily - LD 12/07/23 was [address]    Ellouise DELENA Class, [place] 02/14/24

## 2024-02-15 ENCOUNTER — Telehealth: Payer: Self-pay

## 2024-02-15 ENCOUNTER — Encounter: Admitting: Family

## 2024-02-15 ENCOUNTER — Other Ambulatory Visit: Payer: Self-pay | Admitting: Internal Medicine

## 2024-02-15 DIAGNOSIS — J441 Chronic obstructive pulmonary disease with (acute) exacerbation: Secondary | ICD-10-CM

## 2024-02-15 MED ORDER — VENTOLIN HFA 108 (90 BASE) MCG/ACT IN AERS
1.0000 | INHALATION_SPRAY | Freq: Four times a day (QID) | RESPIRATORY_TRACT | 6 refills | Status: DC | PRN
Start: 2024-02-15 — End: 2024-02-15

## 2024-02-15 MED ORDER — VENTOLIN HFA 108 (90 BASE) MCG/ACT IN AERS: 1.0000 | INHALATION_SPRAY | Freq: Four times a day (QID) | RESPIRATORY_TRACT | 6 refills | Status: AC | PRN

## 2024-02-15 NOTE — Telephone Encounter (Signed)
 Patient left message stating he needed a ride to 7/7 appt, called him back to give him ACTA # and he states he already got a ride.

## 2024-02-15 NOTE — Telephone Encounter (Signed)
 Copied from CRM [id number]. Topic: Clinical - Medication Refill >> Feb 15, 2024  8:17 AM Donna BRAVO wrote: Patient has lung cancer and will be out of this medication today  Medication:  VENTOLIN  HFA 108 (90 Base) MCG/ACT inhaler  ipratropium-albuterol  (DUONEB) 0.5-2.5 (3) MG/3ML  Has the patient contacted their pharmacy? No Pharmacy said to call provider for refills  This is the patient's preferred pharmacy:    [facility] - [place], [place] - [address] [place] [address] Phone: [phone] Fax: [phone]  Is this the correct pharmacy for this prescription? Yes If no, delete pharmacy and type the correct one.   Has the prescription been filled recently? No  Is the patient out of the medication? Yes  patient will be out of this today  Has the patient been seen for an appointment in the last year OR does the patient have an upcoming appointment? Yes  Can we respond through MyChart? No  Agent: Please be advised that Rx refills may take up to 3 business days. We ask that you follow-up with your pharmacy.

## 2024-02-15 NOTE — Telephone Encounter (Unsigned)
 Copied from CRM [id number]. Topic: Clinical - Medication Refill >> Feb 15, 2024  8:05 AM Silvana PARAS wrote: Medication: empagliflozin  (JARDIANCE ) 10 MG TABS tablet, sertraline  (ZOLOFT ) 100 MG tablet, Bumax(not on list) (fluid pill?)  Has the patient contacted their pharmacy? Yes (Agent: If no, request that the patient contact the pharmacy for the refill. If patient does not wish to contact the pharmacy document the reason why and proceed with request.) (Agent: If yes, when and what did the pharmacy advise?)  This is the patient's preferred pharmacy:    [facility] - [place], [place] - [address] [place] [address] Phone: [phone] Fax: [phone]  Is this the correct pharmacy for this prescription? Yes If no, delete pharmacy and type the correct one.   Has the prescription been filled recently? Yes  Is the patient out of the medication? Yes  Has the patient been seen for an appointment in the last year OR does the patient have an upcoming appointment? Yes  Can we respond through MyChart? No  Agent: Please be advised that Rx refills may take up to 3 business days. We ask that you follow-up with your pharmacy.

## 2024-02-15 NOTE — Telephone Encounter (Unsigned)
 Copied from CRM [id number]. Topic: Clinical - Prescription Issue >> Feb 15, 2024 11:06 AM Corean SAUNDERS wrote: Reason for CRM: Lorn from [facility] is requesting Dr. Isaiah to re order the VENTOLIN  HFA 108 (90 Base) MCG/ACT inhaler as a quantity on 18.

## 2024-02-15 NOTE — Telephone Encounter (Signed)
 Copied from CRM [id number]. Topic: Clinical - Medical Advice >> Feb 15, 2024  3:02 PM Noah Velasquez wrote: Reason for CRM: Pt stated he was recently diagnosed with lung cancer and needs at least 2 liters of oxygen. Pt is seeking medical advice on whether or not to turn up the oxygen use to 3 would be okay. Please call th ept back at [phone] ok to leave a vm.

## 2024-02-16 ENCOUNTER — Ambulatory Visit
Admission: RE | Admit: 2024-02-16 | Discharge: 2024-02-16 | Disposition: A | Discharge status: Home / Self Care | Source: Ambulatory Visit | Attending: Oncology | Admitting: Oncology

## 2024-02-16 DIAGNOSIS — E1141 Type 2 diabetes mellitus with diabetic mononeuropathy: Secondary | ICD-10-CM

## 2024-02-16 DIAGNOSIS — R591 Generalized enlarged lymph nodes: Secondary | ICD-10-CM | POA: Diagnosis not present

## 2024-02-16 DIAGNOSIS — R937 Abnormal findings on diagnostic imaging of other parts of musculoskeletal system: Secondary | ICD-10-CM | POA: Insufficient documentation

## 2024-02-16 DIAGNOSIS — R911 Solitary pulmonary nodule: Secondary | ICD-10-CM | POA: Diagnosis not present

## 2024-02-16 DIAGNOSIS — R918 Other nonspecific abnormal finding of lung field: Secondary | ICD-10-CM | POA: Insufficient documentation

## 2024-02-16 LAB — GLUCOSE, CAPILLARY: Glucose-Capillary: 125 mg/dL — ABNORMAL HIGH (ref 70–99)

## 2024-02-16 MED ORDER — SERTRALINE HCL 100 MG PO TABS
100.0000 mg | ORAL_TABLET | Freq: Every day | ORAL | 0 refills | Status: DC
Start: 2024-02-16 — End: 2024-05-10

## 2024-02-16 MED ORDER — FLUDEOXYGLUCOSE F - 18 (FDG) INJECTION
9.6800 | Freq: Once | INTRAVENOUS | Status: AC | PRN
  Administered 2024-02-16: 9.68 via INTRAVENOUS

## 2024-02-16 NOTE — Telephone Encounter (Signed)
 Request [facility]- will forward Rx Requested Prescriptions  Pending Prescriptions Disp Refills   empagliflozin  (JARDIANCE ) 10 MG TABS tablet      Sig: Take 1 tablet (10 mg total) by mouth daily before breakfast.     Endocrinology:  Diabetes - SGLT2 Inhibitors Passed - 02/16/2024  3:40 PM      Passed - Cr in normal range and within 360 days    Creat  Date Value Ref Range Status  12/07/2023 1.03 0.70 - 1.30 mg/dL Final   Creatinine, Ser  Date Value Ref Range Status  01/17/2024 1.05 0.61 - 1.24 mg/dL Final   Creatinine, Urine  Date Value Ref Range Status  12/07/2023 203 20 - 320 mg/dL Final         Passed - HBA1C is between 0 and 7.9 and within 180 days    Hgb A1c MFr Bld  Date Value Ref Range Status  12/07/2023 6.5 (H) <5.7 % Final    Comment:    For someone without known diabetes, a hemoglobin A1c value of 6.5% or greater indicates that they may have  diabetes and this should be confirmed with a follow-up  test. . For someone with known diabetes, a value <7% indicates  that their diabetes is well controlled and a value  greater than or equal to 7% indicates suboptimal  control. A1c targets should be individualized based on  duration of diabetes, age, comorbid conditions, and  other considerations. . Currently, no consensus exists regarding use of hemoglobin A1c for diagnosis of diabetes for children. .          Passed - eGFR in normal range and within 360 days    GFR, Est African American  Date Value Ref Range Status  06/06/2020 74 > OR = 60 mL/min/1.30m2 Final   GFR, Est Non African American  Date Value Ref Range Status  06/06/2020 64 > OR = 60 mL/min/1.70m2 Final   GFR, Estimated  Date Value Ref Range Status  01/17/2024 >60 >60 mL/min Final    Comment:    (NOTE) Calculated using the CKD-EPI Creatinine Equation (2021)    eGFR  Date Value Ref Range Status  12/07/2023 84 > OR = 60 mL/min/1.66m2 Final         Passed - Valid encounter within last 6  months    Recent Outpatient Visits           2 months ago Type 2 diabetes mellitus with diabetic mononeuropathy, without long-term current use of insulin  [facility])   [facility] [place], Angeline ORN, NP       Future Appointments             In 1 week Antonette Angeline ORN, NP  [facility]             sertraline  (ZOLOFT ) 100 MG tablet 90 tablet 0    Sig: Take 1 tablet (100 mg total) by mouth daily.     Psychiatry:  Antidepressants - SSRI - sertraline  Passed - 02/16/2024  3:40 PM      Passed - AST in normal range and within 360 days    AST  Date Value Ref Range Status  01/15/2024 25 15 - 41 U/L Final         Passed - ALT in normal range and within 360 days    ALT  Date Value Ref Range Status  01/15/2024 15 0 - 44 U/L Final  Passed - Completed PHQ-2 or PHQ-9 in the last 360 days      Passed - Valid encounter within last 6 months    Recent Outpatient Visits           2 months ago Type 2 diabetes mellitus with diabetic mononeuropathy, without long-term current use of insulin  [facility])   [facility] [place], Angeline ORN, NP       Future Appointments             In 1 week Antonette, Angeline ORN, NP [facility], [facility]

## 2024-02-16 NOTE — Telephone Encounter (Signed)
 Requested medication (s) are due for refill today - unsure  Requested medication (s) are on the active medication list -yes  Future visit scheduled -yes  Last refill: 01/25/24  Notes to clinic: outside provider- sent for review, Also there is request for Bumex- not list in current or past medications   Requested Prescriptions  Pending Prescriptions Disp Refills   empagliflozin  (JARDIANCE ) 10 MG TABS tablet      Sig: Take 1 tablet (10 mg total) by mouth daily before breakfast.     Endocrinology:  Diabetes - SGLT2 Inhibitors Passed - 02/16/2024  3:41 PM      Passed - Cr in normal range and within 360 days    Creat  Date Value Ref Range Status  12/07/2023 1.03 0.70 - 1.30 mg/dL Final   Creatinine, Ser  Date Value Ref Range Status  01/17/2024 1.05 0.61 - 1.24 mg/dL Final   Creatinine, Urine  Date Value Ref Range Status  12/07/2023 203 20 - 320 mg/dL Final         Passed - HBA1C is between 0 and 7.9 and within 180 days    Hgb A1c MFr Bld  Date Value Ref Range Status  12/07/2023 6.5 (H) <5.7 % Final    Comment:    For someone without known diabetes, a hemoglobin A1c value of 6.5% or greater indicates that they may have  diabetes and this should be confirmed with a follow-up  test. . For someone with known diabetes, a value <7% indicates  that their diabetes is well controlled and a value  greater than or equal to 7% indicates suboptimal  control. A1c targets should be individualized based on  duration of diabetes, age, comorbid conditions, and  other considerations. . Currently, no consensus exists regarding use of hemoglobin A1c for diagnosis of diabetes for children. .          Passed - eGFR in normal range and within 360 days    GFR, Est African American  Date Value Ref Range Status  06/06/2020 74 > OR = 60 mL/min/1.95m2 Final   GFR, Est Non African American  Date Value Ref Range Status  06/06/2020 64 > OR = 60 mL/min/1.8m2 Final   GFR, Estimated  Date Value  Ref Range Status  01/17/2024 >60 >60 mL/min Final    Comment:    (NOTE) Calculated using the CKD-EPI Creatinine Equation (2021)    eGFR  Date Value Ref Range Status  12/07/2023 84 > OR = 60 mL/min/1.27m2 Final         Passed - Valid encounter within last 6 months    Recent Outpatient Visits           2 months ago Type 2 diabetes mellitus with diabetic mononeuropathy, without long-term current use of insulin  [facility])   [place] [facility] [place], Angeline ORN, NP       Future Appointments             In 1 week Antonette Angeline ORN, NP  [facility], [facility]            Signed Prescriptions Disp Refills   sertraline  (ZOLOFT ) 100 MG tablet 90 tablet 0    Sig: Take 1 tablet (100 mg total) by mouth daily.     Psychiatry:  Antidepressants - SSRI - sertraline  Passed - 02/16/2024  3:41 PM      Passed - AST in normal range and within 360 days    AST  Date Value  Ref Range Status  01/15/2024 25 15 - 41 U/L Final         Passed - ALT in normal range and within 360 days    ALT  Date Value Ref Range Status  01/15/2024 15 0 - 44 U/L Final         Passed - Completed PHQ-2 or PHQ-9 in the last 360 days      Passed - Valid encounter within last 6 months    Recent Outpatient Visits           2 months ago Type 2 diabetes mellitus with diabetic mononeuropathy, without long-term current use of insulin  [facility])   [place] [facility] [place], Angeline ORN, NP       Future Appointments             In 1 week [place], Angeline ORN, NP [place] [facility], [facility]               Requested Prescriptions  Pending Prescriptions Disp Refills   empagliflozin  (JARDIANCE ) 10 MG TABS tablet      Sig: Take 1 tablet (10 mg total) by mouth daily before breakfast.     Endocrinology:  Diabetes - SGLT2 Inhibitors Passed - 02/16/2024  3:41 PM      Passed - Cr in normal range and within 360 days    Creat  Date Value Ref Range Status   12/07/2023 1.03 0.70 - 1.30 mg/dL Final   Creatinine, Ser  Date Value Ref Range Status  01/17/2024 1.05 0.61 - 1.24 mg/dL Final   Creatinine, Urine  Date Value Ref Range Status  12/07/2023 203 20 - 320 mg/dL Final         Passed - HBA1C is between 0 and 7.9 and within 180 days    Hgb A1c MFr Bld  Date Value Ref Range Status  12/07/2023 6.5 (H) <5.7 % Final    Comment:    For someone without known diabetes, a hemoglobin A1c value of 6.5% or greater indicates that they may have  diabetes and this should be confirmed with a follow-up  test. . For someone with known diabetes, a value <7% indicates  that their diabetes is well controlled and a value  greater than or equal to 7% indicates suboptimal  control. A1c targets should be individualized based on  duration of diabetes, age, comorbid conditions, and  other considerations. . Currently, no consensus exists regarding use of hemoglobin A1c for diagnosis of diabetes for children. .          Passed - eGFR in normal range and within 360 days    GFR, Est African American  Date Value Ref Range Status  06/06/2020 74 > OR = 60 mL/min/1.49m2 Final   GFR, Est Non African American  Date Value Ref Range Status  06/06/2020 64 > OR = 60 mL/min/1.39m2 Final   GFR, Estimated  Date Value Ref Range Status  01/17/2024 >60 >60 mL/min Final    Comment:    (NOTE) Calculated using the CKD-EPI Creatinine Equation (2021)    eGFR  Date Value Ref Range Status  12/07/2023 84 > OR = 60 mL/min/1.19m2 Final         Passed - Valid encounter within last 6 months    Recent Outpatient Visits           2 months ago Type 2 diabetes mellitus with diabetic mononeuropathy, without long-term current use of insulin  (HCC)   [place]  [facility] [place], Angeline ORN, NP       Future Appointments             In 1 week Antonette Angeline ORN, NP [place] [facility], [place]            Signed Prescriptions Disp  Refills   sertraline  (ZOLOFT ) 100 MG tablet 90 tablet 0    Sig: Take 1 tablet (100 mg total) by mouth daily.     Psychiatry:  Antidepressants - SSRI - sertraline  Passed - 02/16/2024  3:41 PM      Passed - AST in normal range and within 360 days    AST  Date Value Ref Range Status  01/15/2024 25 15 - 41 U/L Final         Passed - ALT in normal range and within 360 days    ALT  Date Value Ref Range Status  01/15/2024 15 0 - 44 U/L Final         Passed - Completed PHQ-2 or PHQ-9 in the last 360 days      Passed - Valid encounter within last 6 months    Recent Outpatient Visits           2 months ago Type 2 diabetes mellitus with diabetic mononeuropathy, without long-term current use of insulin  [facility])   [place] [facility] [place], Angeline ORN, NP       Future Appointments             In 1 week Antonette, Angeline ORN, NP [place] [facility], [place] [facility]

## 2024-02-17 DIAGNOSIS — R0602 Shortness of breath: Secondary | ICD-10-CM | POA: Diagnosis not present

## 2024-02-17 DIAGNOSIS — J449 Chronic obstructive pulmonary disease, unspecified: Secondary | ICD-10-CM | POA: Diagnosis not present

## 2024-02-17 MED ORDER — EMPAGLIFLOZIN 10 MG PO TABS: 10.0000 mg | ORAL_TABLET | Freq: Every day | ORAL | Status: DC

## 2024-02-21 ENCOUNTER — Encounter: Payer: Self-pay | Admitting: *Deleted

## 2024-02-21 ENCOUNTER — Other Ambulatory Visit: Payer: Self-pay

## 2024-02-21 ENCOUNTER — Inpatient Hospital Stay

## 2024-02-21 ENCOUNTER — Encounter: Payer: Self-pay | Admitting: Oncology

## 2024-02-21 ENCOUNTER — Inpatient Hospital Stay (HOSPITAL_BASED_OUTPATIENT_CLINIC_OR_DEPARTMENT_OTHER): Admitting: Oncology

## 2024-02-21 VITALS — BP 140/88 | HR 77 | Temp 97.3°F | Resp 16 | Ht 67.0 in | Wt 165.0 lb

## 2024-02-21 DIAGNOSIS — C3412 Malignant neoplasm of upper lobe, left bronchus or lung: Secondary | ICD-10-CM

## 2024-02-21 DIAGNOSIS — Z5111 Encounter for antineoplastic chemotherapy: Secondary | ICD-10-CM | POA: Diagnosis not present

## 2024-02-21 DIAGNOSIS — Z7189 Other specified counseling: Secondary | ICD-10-CM | POA: Diagnosis not present

## 2024-02-21 DIAGNOSIS — Z87891 Personal history of nicotine dependence: Secondary | ICD-10-CM | POA: Insufficient documentation

## 2024-02-21 DIAGNOSIS — M25559 Pain in unspecified hip: Secondary | ICD-10-CM | POA: Insufficient documentation

## 2024-02-21 DIAGNOSIS — Z510 Encounter for antineoplastic radiation therapy: Secondary | ICD-10-CM | POA: Diagnosis not present

## 2024-02-21 MED ORDER — LIDOCAINE-PRILOCAINE 2.5-2.5 % EX CREA
TOPICAL_CREAM | CUTANEOUS | 3 refills | Status: DC
  Filled 2024-02-21: qty 30, 30d supply, fill #0

## 2024-02-21 MED ORDER — PROCHLORPERAZINE MALEATE 10 MG PO TABS
10.0000 mg | ORAL_TABLET | Freq: Four times a day (QID) | ORAL | 1 refills | Status: DC | PRN
  Filled 2024-02-21: qty 30, 8d supply, fill #0

## 2024-02-21 MED ORDER — DEXAMETHASONE 4 MG PO TABS
8.0000 mg | ORAL_TABLET | Freq: Every day | ORAL | 1 refills | Status: DC
  Filled 2024-02-21: qty 30, 15d supply, fill #0

## 2024-02-21 MED ORDER — ONDANSETRON HCL 8 MG PO TABS
8.0000 mg | ORAL_TABLET | Freq: Three times a day (TID) | ORAL | 1 refills | Status: DC | PRN
  Filled 2024-02-21: qty 30, 10d supply, fill #0

## 2024-02-21 NOTE — Progress Notes (Signed)
 Met with patient during follow up visit with Dr. Melanee. All questions answered during visit. Reviewed upcoming appts with patient and provided print-out. Informed that will call with appts for port placement, chemo appts, and consult appt with Dr. Lenn. Pt states that he has difficulty with transportation. Informed that can place referral to social work to help provide further transportation resources. Nothing further needed at this time. Instructed pt to call with any questions or needs. Pt verbalized understanding.

## 2024-02-21 NOTE — Progress Notes (Signed)
 START ON PATHWAY REGIMEN - Small Cell Lung     A cycle is every 21 days:     Etoposide      Cisplatin   **Always confirm dose/schedule in your pharmacy ordering system**  Patient Characteristics: Newly Diagnosed, Preoperative or Nonsurgical Candidate (Clinical Staging), First Line, Limited Stage, Nonsurgical Candidate Therapeutic Status: Newly Diagnosed, Preoperative or Nonsurgical Candidate (Clinical Staging) AJCC T Category: cT2a AJCC N Category: cN2b AJCC M Category: cM0 AJCC 9 Stage Grouping: IIIB Check here if patient was staged using an edition other than AJCC Staging 9th Edition: false Stage Classification: Limited Surgical Candidacy: Nonsurgical Candidate Intent of Therapy: Curative Intent, Discussed with Patient

## 2024-02-21 NOTE — Progress Notes (Signed)
 [facility] Clinical Social Work  Clinical Social Work was referred by Statistician for need for [employer].  Clinical Social Worker contacted patient by phone to offer support and assess for needs.   Patient requested transportation assistance for his radiation treatments.  Patient acknowledged he already utilizes his Medicaid transportation benefit.  CSW informed him that since he has this benefit, he cannot utilize the hospital shuttle.  He stated he understood.  Interventions: Referred patient to community resources: Dante Abe, [place] transportation benefit, [employer]       Follow Up Plan:  CSW will follow-up with patient by phone     Macario CHRISTELLA Au, LCSW  Clinical Social Worker [facility]        Patient is participating in a Managed Medicaid Plan:  Yes

## 2024-02-21 NOTE — Progress Notes (Signed)
 Patient had a PET scan on 02/16/2024. He is wanting to have a fluid pill prescribed to him. He needs a refill on his jardiance .

## 2024-02-21 NOTE — Progress Notes (Signed)
 Hematology/Oncology Consult note [facility]  [phone] [phone]  Patient Care Team: Antonette Angeline ORN, NP as PCP - General (Internal Medicine) Verdene Gills, RN as Oncology Nurse Navigator   Name of the patient: Noah Velasquez  [id number]  May 17, 1966   Date of visit: 02/21/24  Diagnosis- likely limited stage small cell lung cancer  Chief complaint/ Reason for visit- discuss pet scan results and further management  Heme/Onc history: Patient is a 58 year old African-American male with a past medical history significant for CHF, interstitial lung disease, type 2 diabetes.  He is mainly wheelchair-bound but lives independently.  He underwent CT chest without contrast when he had a chest x-ray which showed a lung nodule.  CT chest showed a 3.7 x 1.8 cm lobulated mass in the left upper lobe abutting the chest wall concerning for primary bronchogenic carcinoma.  Adjacent satellite nodularity in the left upper lobe.  14 mm right paratracheal node and 14 mm short axis prevascular node.  Moderate centrilobular and paraseptal emphysematous changes.  MRI brain with and without contrast showed remote infarcts and chronic microvascular disease but no evidence of acute intracranial abnormality.  Patient was seen by Dr. Isaiah and underwent bronchoscopy.  Left upper lobe lung biopsy was consistent with poorly differentiated carcinoma consistent with small cell carcinoma.  Tumor was positive for TTF-1 and neuroendocrine markers synaptophysin.  Negative for Napsin A.  Negative for squamous cell marker p40.FNA of the right paratracheal adenopathy was done and was negative for malignancy but left-sided mediastinal and hilar lymph nodes were not sampled at the time of bronchoscopy.  He uses 2 L of oxygen mainly at night but not during the day   PET CT scan in July 2025 showed left upper lobe lung mass with an SUV of 15.7 consistent with neoplasm.  Additional small  adjacent nodules in the left upper lobe.  Several left-sided upper mediastinal hypermetabolic lymph nodes including an AP window node with an SUV of 13.8 measuring 3 x 2.3 cm.  Another lymph node to the left of the ascending aorta measuring 3.6 x 1.7 cm with an SUV of 15.9.  Small nodes superior to this level at the level of great vessels as well as additional suspicious nodes in the right paratracheal region with an SUV of 4.4.  Area of uptake along the proximal left humeral shaft with an SUV of 9.3 which could be an area of isolated bone metastases.  Low level of uptake in the right inguinal lymph node with an SUV of 3.7.   Interval history-no acute issues since his last visit.  No recent falls.  He has chronic fatigue and exertional shortness of breath.  ECOG PS- 2 Pain scale- 3   Review of systems- Review of Systems  Constitutional:  Positive for malaise/fatigue. Negative for chills, fever and weight loss.  HENT:  Negative for congestion, ear discharge and nosebleeds.   Eyes:  Negative for blurred vision.  Respiratory:  Positive for shortness of breath. Negative for cough, hemoptysis, sputum production and wheezing.   Cardiovascular:  Negative for chest pain, palpitations, orthopnea and claudication.  Gastrointestinal:  Negative for abdominal pain, blood in stool, constipation, diarrhea, heartburn, melena, nausea and vomiting.  Genitourinary:  Negative for dysuria, flank pain, frequency, hematuria and urgency.  Musculoskeletal:  Negative for back pain, joint pain and myalgias.  Skin:  Negative for rash.  Neurological:  Negative for dizziness, tingling, focal weakness, seizures, weakness and headaches.  Endo/Heme/Allergies:  Does not bruise/bleed  easily.  Psychiatric/Behavioral:  Negative for depression and suicidal ideas. The patient does not have insomnia.       Allergies  Allergen Reactions   Hctz [Hydrochlorothiazide] Swelling   Ibuprofen Swelling     Past Medical History:   Diagnosis Date   Alcohol  use disorder    Aortic atherosclerosis (HCC)    Bronchiolo-alveolar adenocarcinoma of left lung (HCC)    CHF (congestive heart failure) (HCC)    Chronic hip pain    Chronic pancreatitis (HCC)    Cigarette smoker    CKD (chronic kidney disease) stage 3, GFR 30-59 ml/min (HCC)    COPD (chronic obstructive pulmonary disease) (HCC)    Crack cocaine use    as of 01-21-24, pt states he last used a few weeks ago   Depression    DM (diabetes mellitus), type 2 (HCC)    Dyspnea    ED (erectile dysfunction)    ESBL (extended spectrum beta-lactamase) producing bacteria infection 2021   GERD (gastroesophageal reflux disease)    Hyperlipidemia    Hypertension    ILD (interstitial lung disease) (HCC)    Marijuana use    Osteoarthritis    Pneumonia    PTSD (post-traumatic stress disorder)    Seizure (HCC)    x1 years ago   Viral infection of left eye      Past Surgical History:  Procedure Laterality Date   BRONCHOSCOPY, WITH BIOPSY USING ELECTROMAGNETIC NAVIGATION Left 01/24/2024   Procedure: BRONCHOSCOPY, WITH BIOPSY USING ELECTROMAGNETIC NAVIGATION;  Surgeon: Isaiah Scrivener, MD;  Location: [facility] ORS;  Service: Pulmonary;  Laterality: Left;   ENDOBRONCHIAL ULTRASOUND Right 01/24/2024   Procedure: ENDOBRONCHIAL ULTRASOUND (EBUS);  Surgeon: Kasa, Kurian, MD;  Location: [facility] ORS;  Service: Pulmonary;  Laterality: Right;   pancreatic stent placement      Social History   Socioeconomic History   Marital status: Legally Separated    Spouse name: Not on file   Number of children: 2   Years of education: Not on file   Highest education level: High school graduate  Occupational History   Occupation: on disability  Tobacco Use   Smoking status: Former    Current packs/day: 3.00    Average packs/day: 3.0 packs/day for 15.0 years (45.0 ttl pk-yrs)    Types: Cigarettes   Smokeless tobacco: Never   Tobacco comments:    At his heaviest smoked 3ppd and now occasionally  smoke 1 cigarette. 02/01/24  Vaping Use   Vaping status: Some Days  Substance and Sexual Activity   Alcohol  use: Yes    Alcohol /week: 7.0 standard drinks of alcohol     Types: 7 Cans of beer per week    Comment: occ   Drug use: Yes    Types: Marijuana, Crack cocaine    Comment: Marijuana every day. 7-67yrs clean of crack cocaine.   Sexual activity: Yes    Birth control/protection: Condom  Other Topics Concern   Not on file  Social History Narrative   Not on file   Social Drivers of Health   Financial Resource Strain: Not on file  Food Insecurity: No Food Insecurity (01/31/2024)   Hunger Vital Sign    Worried About Running Out of Food in the Last Year: Never true    Ran Out of Food in the Last Year: Never true  Recent Concern: Food Insecurity - Food Insecurity Present (01/15/2024)   Hunger Vital Sign    Worried About Running Out of Food in the Last Year: Sometimes true  Ran Out of Food in the Last Year: Sometimes true  Transportation Needs: No Transportation Needs (01/31/2024)   PRAPARE - Administrator, Civil Service (Medical): No    Lack of Transportation (Non-Medical): No  Physical Activity: Not on file  Stress: Not on file  Social Connections: Not on file  Intimate Partner Violence: Not At Risk (01/31/2024)   Humiliation, Afraid, Rape, and Kick questionnaire    Fear of Current or Ex-Partner: No    Emotionally Abused: No    Physically Abused: No    Sexually Abused: No    Family History  Problem Relation Age of Onset   Diabetes Mother    Sleep apnea Mother    Emphysema Sister    CAD Neg Hx    Mental illness Neg Hx      Current Outpatient Medications:    Accu-Chek Softclix Lancets lancets, 1 each in the morning, at noon, and at bedtime. May substitute to any manufacturer covered by patient's insurance., Disp: 100 each, Rfl: 0   amLODipine -olmesartan  (AZOR ) 10-40 MG tablet, Take 1 tablet by mouth daily., Disp: 90 tablet, Rfl: 1   Blood Glucose  Monitoring Suppl (BLOOD GLUCOSE MONITOR SYSTEM) w/Device KIT, 1 each by Does not apply route in the morning, at noon, and at bedtime. May substitute to any manufacturer covered by patient's insurance., Disp: 1 kit, Rfl: 0   empagliflozin  (JARDIANCE ) 10 MG TABS tablet, Take 1 tablet (10 mg total) by mouth daily before breakfast., Disp: , Rfl:    Glucose Blood (BLOOD GLUCOSE TEST STRIPS) STRP, 1 each by In Vitro route in the morning, at noon, and at bedtime. May substitute to any manufacturer covered by patient's insurance., Disp: 100 strip, Rfl: 0   Lancet Device MISC, 1 each by Does not apply route in the morning, at noon, and at bedtime. May substitute to any manufacturer covered by patient's insurance., Disp: 1 each, Rfl: 0   lovastatin  (MEVACOR ) 10 MG tablet, Take 1 tablet (10 mg total) by mouth at bedtime., Disp: 90 tablet, Rfl: 1   metFORMIN  (GLUCOPHAGE ) 500 MG tablet, Take 1 tablet (500 mg total) by mouth daily with breakfast., Disp: 90 tablet, Rfl: 1   naproxen  (NAPROSYN ) 250 MG tablet, Take 250 mg by mouth 2 (two) times daily with a meal., Disp: , Rfl:    Nebulizers (COMPRESSOR/NEBULIZER) MISC, Nebulizer and tubes/mask to use as directed with DuoNeb as needed, Disp: 1 each, Rfl: 0   nicotine  (NICODERM CQ  - DOSED IN MG/24 HOURS) 21 mg/24hr patch, Place 1 patch (21 mg total) onto the skin daily., Disp: 28 patch, Rfl: 0   omeprazole  (PRILOSEC) 20 MG capsule, Take 1 capsule (20 mg total) by mouth daily., Disp: 90 capsule, Rfl: 1   OXYGEN, Inhale 2 L into the lungs as needed., Disp: , Rfl:    Pancrelipase , Lip-Prot-Amyl, (ZENPEP ) 15000-47000 units CPEP, Take 1 capsule (15,000 Units total) by mouth 3 (three) times daily., Disp: 270 capsule, Rfl: 1   sertraline  (ZOLOFT ) 100 MG tablet, Take 1 tablet (100 mg total) by mouth daily., Disp: 90 tablet, Rfl: 0   traZODone  (DESYREL ) 150 MG tablet, Take 1 tablet (150 mg total) by mouth at bedtime as needed for sleep., Disp: 90 tablet, Rfl: 1   VENTOLIN  HFA 108  (90 Base) MCG/ACT inhaler, Inhale 1-2 puffs into the lungs every 6 (six) hours as needed for wheezing or shortness of breath (or coughing)., Disp: 18 g, Rfl: 6   dexamethasone  (DECADRON ) 4 MG tablet, Take 2 tablets (  8 mg total) by mouth daily. Take for 1 day starting the day after chemotherapy on day 4. Take with food., Disp: 30 tablet, Rfl: 1   lidocaine -prilocaine  (EMLA ) cream, Apply to affected area once, Disp: 30 g, Rfl: 3   ondansetron  (ZOFRAN ) 8 MG tablet, Take 1 tablet (8 mg total) by mouth every 8 (eight) hours as needed for nausea or vomiting. Start on the third day after cisplatin., Disp: 30 tablet, Rfl: 1   prochlorperazine  (COMPAZINE ) 10 MG tablet, Take 1 tablet (10 mg total) by mouth every 6 (six) hours as needed for nausea or vomiting., Disp: 30 tablet, Rfl: 1  Physical exam:  Vitals:   02/21/24 1416 02/21/24 1423  BP: (!) 140/90 (!) 140/88  Pulse: 77   Resp: 16   Temp: (!) 97.3 F (36.3 C)   TempSrc: Tympanic   SpO2: 97%   Weight: 165 lb (74.8 kg)   Height: 5' 7 (1.702 m)    Physical Exam Constitutional:      Comments: Sitting in a wheelchair.  Appears in no acute distress.  He is on home oxygen  Cardiovascular:     Rate and Rhythm: Normal rate and regular rhythm.     Heart sounds: Normal heart sounds.  Pulmonary:     Comments: Scattered bilateral wheezing Abdominal:     General: Bowel sounds are normal.     Palpations: Abdomen is soft.  Skin:    General: Skin is warm and dry.  Neurological:     Mental Status: He is alert and oriented to person, place, and time.      I have personally reviewed labs listed below:    Latest Ref Rng & Units 01/17/2024   10:43 AM  CMP  Glucose 70 - 99 mg/dL 827   BUN 6 - 20 mg/dL 20   Creatinine 9.38 - 1.24 mg/dL 8.94   Sodium 864 - 854 mmol/L 133   Potassium 3.5 - 5.1 mmol/L 3.5   Chloride 98 - 111 mmol/L 97   CO2 22 - 32 mmol/L 25   Calcium  8.9 - 10.3 mg/dL 8.6       Latest Ref Rng & Units 01/16/2024    7:02 AM  CBC   WBC 4.0 - 10.5 K/uL 10.7   Hemoglobin 13.0 - 17.0 g/dL 85.5   Hematocrit 60.9 - 52.0 % 41.2   Platelets 150 - 400 K/uL 267    I have personally reviewed Radiology images listed below:   NM PET Image Initial (PI) Skull Base To Thigh Result Date: 02/17/2024 CLINICAL DATA:  Initial treatment strategy for lung nodule. EXAM: NUCLEAR MEDICINE PET SKULL BASE TO THIGH TECHNIQUE: 9.68 mCi F-18 FDG was injected intravenously. Full-ring PET imaging was performed from the skull base to thigh after the radiotracer. CT data was obtained and used for attenuation correction and anatomic localization. Fasting blood glucose: 125 mg/dl COMPARISON:  Chest CT without contrast 01/16/2024. Abdominal imaging 2023 and older FINDINGS: Mediastinal blood pool activity: SUV max 2.0 Liver activity: SUV max 2.7 NECK: There is some physiologic uptake along the strap muscles in the lung neck bilaterally. No specific abnormal uptake identified above blood pool in the neck otherwise including along lymph node change of the submandibular, posterior triangle or internal jugular region. Incidental CT findings: The parotid glands, submandibular glands and thyroid  glands unremarkable. Visualized portions of the paranasal sinuses and mastoid air cells are grossly clear. Scattered vascular calcifications are seen. CHEST: On the prior CT scan was a multilobular peripheral left upper  lobe mass abutting the pleura. This is again seen today and is similar in appearance by CT, such as series 6, image 45. This area has uptake of maximum SUV value of 15.7 consistent with neoplasm. There are some additional small adjacent nodules in the left upper lobe including just posteromedial which also show abnormal uptake and could be satellite lesions. Example with maximum SUV of 3.4 is seen on CT image 45 measuring 8 mm. Another example on image 49 today more anteromedial adjacent to the hilum measures 9 mm and has uptake of maximum SUV of 7.4. Few other foci as  well left upper lobe. No other areas of abnormal lung uptake. There are several left-sided upper mediastinal hypermetabolic nodes. These include AP window measuring maximum SUV of 13.8 and dimension on image 49 today at 3.0 by 2.3 cm. Focus to the left of the ascending aorta has dimension on image 44 measuring 3.6 by 1.7 cm. Maximum SUV of 15.9. There are some small nodes more superior to this at the level of the great vessels which has some low-level uptake and are suspicious. Additional suspicious nodes right paratracheal with maximum SUV value of 4.4 and dimension on image 43 a short axis of 14 mm. Suspicious subcarinal nodes as well. No significant abnormal uptake in the axilla. Incidental CT findings: Heart is nonenlarged. Prominent vascular calcifications including along the coronary arteries. Please correlate for other coronary risk factors. Bovine type aortic arch, normal variant. Patulous esophagus with a small hiatal hernia. Emphysematous lung changes identified greatest in the upper lung zones. There is some dependent atelectasis. Breathing motion. No pleural effusion. ABDOMEN/PELVIS: There is physiologic distribution radiotracer along the parenchymal organs, bowel and renal collecting systems. Low-level uptake along right inguinal nodes which have maximum SUV value of 3.7 for example. Node in this location has short axis of 11 mm on series 6, image 133. Incidental CT findings: On this limited noncontrast CT scan for attenuation correction, grossly the liver, spleen, adrenal glands are unremarkable. Dystrophic calcifications along the atrophic pancreas with pancreatic dilatation. Please correlate for any history of chronic calcific pancreatitis or other process. This was described previously. Right-sided greater than left renal atrophy. No renal or ureteral stones clearly identified. Preserved contour to the urinary bladder. Underdistended stomach. Small bowel is nondilated. Normal retrocecal appendix.  Large bowel has a normal course and caliber with scattered stool. Breathing motion. Diffuse vascular calcifications identified along the aorta and branch vessels. Most extensive along the common iliac vessels, right peer than left. SKELETON: There is a area of uptake along the proximal left humeral shaft which has maximum SUV value of 9.3. Slightly heterogeneous appearance of the marrow on CT in this location. No other areas of abnormal bony uptake identified along the visualized skeleton. This could be an isolated bony metastasis. Incidental CT findings: Diffuse degenerative changes identified. Particularly advanced are changes along the right hip with subchondral cyst formation, severe joint space loss and osteophytes. IMPRESSION: Left upper lobe peripheral lung mass again identified and has significant abnormal uptake consistent with neoplasm. There are several satellite lesions which show some uptake in the left upper lobe as well. Several significant hypermetabolic nodes along the left side of the mediastinum including prevascular, AP window. Consistent with regional nodal metastasis. In addition there are several other enlarged but smaller nodes in the mediastinum including right paratracheal and subcarinal which have been slight increased uptake but much less than the aforementioned nodes in the primary lung lesion. Although these also could represent early  areas of spread of disease these were also have a differential. There is a similar appearing node in the right inguinal region as well. Attention on follow-up versus additional workup. Isolated bony area of abnormal uptake along the proximal humeral shaft with slight heterogeneity on CT in this location. An isolated bony metastasis is possible. Please correlate for any known history. Dedicated humerus x-rays may be of some benefit. Electronically Signed   By: Ranell Bring M.D.   On: 02/17/2024 15:27   DG Chest Port 1 View Result Date: 01/24/2024 CLINICAL  DATA:  Bronchoscopy EXAM: PORTABLE CHEST 1 VIEW COMPARISON:  Chest x-ray performed January 16, 2024 FINDINGS: No pneumothorax. Parenchymal opacity projects over the left mid lung zone. Interstitial airspace opacities are also present. IMPRESSION: 1. No pneumothorax. 2. Parenchymal opacity at the site of suspected bronchoscopy/biopsy. 3. Low lung volumes with interstitial airspace opacities. Electronically Signed   By: Maude Naegeli M.D.   On: 01/24/2024 12:45   DG C-Arm 1-60 Min-No Report Result Date: 01/24/2024 Fluoroscopy was utilized by the requesting physician.  No radiographic interpretation.     Assessment and plan- Patient is a 58 y.o. 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  There was a nonspecific area of uptake along the proximal left humeral shaft with an SUV of 9.3 which was unclear if it represents metastatic disease.  I would like to have this reviewed at tumor board this week.  As such I am inclined to treat him as a limited stage small cell lung cancer with T2 N2b disease and continue to monitor the area of left shaft.  I will refer him to radiation oncology for consideration of concurrent chemoradiation for limited stage small cell lung cancer.  Patient is concerned about his transportation back and forth to the cancer center if radiation would be every day for 7 weeks.  We will arrange for port placement and chemotherapy teach.  I would like to offer him combination chemotherapy with cisplatin at 60 mg/m instead of full dose 80 mg/m given his baseline health as well as Baseline neuropathy.  He does not have any evidence of baseline CKD.  Etoposide  will be given at full dose of 100 mg/m.  Etoposide will be given on 3 consecutive days and cisplatin on day 1 alone.  This will be done every 3 weeks for up to 4 cycles followed by consideration for repeat scans.  Discussed risks and benefits of chemotherapy including all but not limited to nausea vomiting low blood counts risk of infections and hospitalization as well as hair loss peripheral neuropathy, AKI and hearing loss.  Treatment will be given with the potential curative intent.  Depending on how his counts do we will decide about growth factor support with subsequent cycles outside of radiation.  I will plan to start chemotherapy next week regardless of when he starts radiation.  If he tolerates chemotherapy overall poorly we will consider switching him from cisplatin to carboplatin.  I will have him see Dr. Rennie next week to start cycle 1 of chemotherapy.  He would benefit from consolidative durvalumab down the line upon completion of chemoradiation.  Will also need to discuss prophylactic cranial irradiation  with when he meets with radiation oncology.  MRI brain did not show evidence of metastatic disease.  Patient comprehends my plan well   Cancer Staging  Small cell lung cancer, left upper lobe [facility]) Staging form: Lung, AJCC V9 - Clinical stage from 02/21/2024: Stage IIIB (cT2, cN2b, cM0) - Signed by [name], MD on 02/21/2024 Method of lymph node assessment: Clinical     Visit Diagnosis 1. Small cell lung cancer, left upper lobe (HCC)   2. Goals of care, counseling/discussion      Dr. Annah Melanee, MD, MPH [facility] [phone] 02/21/2024 8:43 PM

## 2024-02-22 ENCOUNTER — Encounter: Payer: Self-pay | Admitting: Oncology

## 2024-02-22 ENCOUNTER — Other Ambulatory Visit: Payer: Self-pay

## 2024-02-22 ENCOUNTER — Encounter: Payer: Self-pay | Admitting: *Deleted

## 2024-02-22 NOTE — Progress Notes (Signed)
 Attempted to contact pt a couple of times today to discuss scheduling port placement for Friday after chemo education class. Pt did not answer. Message left instructing pt to call back to review appt for port placement.

## 2024-02-22 NOTE — Progress Notes (Signed)
 Pharmacist Chemotherapy Monitoring - Initial Assessment    Anticipated start date: 03/01/24   The following has been reviewed per standard work regarding the patient's treatment regimen: The patient's diagnosis, treatment plan and drug doses, and organ/hematologic function Lab orders and baseline tests specific to treatment regimen  The treatment plan start date, drug sequencing, and pre-medications Prior authorization status  Patient's documented medication list, including drug-drug interaction screen and prescriptions for anti-emetics and supportive care specific to the treatment regimen The drug concentrations, fluid compatibility, administration routes, and timing of the medications to be used The patient's access for treatment and lifetime cumulative dose history, if applicable  The patient's medication allergies and previous infusion related reactions, if applicable   Changes made to treatment plan:  pre-medications emend to cinvanti  Follow up needed:  Port placement pending   Maudie FORBES Andreas, PharmD, BCPS Clinical Pharmacist   02/22/2024  3:20 PM

## 2024-02-23 ENCOUNTER — Telehealth: Payer: Self-pay | Admitting: Family

## 2024-02-23 ENCOUNTER — Ambulatory Visit: Admitting: Radiation Oncology

## 2024-02-23 ENCOUNTER — Other Ambulatory Visit: Payer: Self-pay

## 2024-02-23 ENCOUNTER — Telehealth: Payer: Self-pay

## 2024-02-23 ENCOUNTER — Encounter: Payer: Self-pay | Admitting: *Deleted

## 2024-02-23 MED ORDER — IPRATROPIUM-ALBUTEROL 0.5-2.5 (3) MG/3ML IN SOLN: 3.0000 mL | RESPIRATORY_TRACT | 1 refills | Status: AC | PRN

## 2024-02-23 NOTE — Telephone Encounter (Signed)
 Okay to refill  Marsa Officer, DO [facility] 02/23/2024, 11:01 AM

## 2024-02-23 NOTE — Telephone Encounter (Signed)
 Did you ever decide about discharge on this patient?

## 2024-02-23 NOTE — Telephone Encounter (Signed)
 Called to confirm/remind patient of their appointment [facility] on 02/24/24.   Appointment:   [x] Confirmed  [] Left mess   [] No answer/No voice mail  [] VM Full/unable to leave message  [] Phone not in service  Patient reminded to bring all medications and/or complete list.  Confirmed patient has transportation. Gave directions, instructed to utilize valet parking.

## 2024-02-23 NOTE — Telephone Encounter (Signed)
 Received fax from [facility] refill on ipratropium-albuterol  0.5/2.5    1 vial via nebulizer every 2 hour   This is in historical med list, not prescribed by our office. Ok to refill?

## 2024-02-23 NOTE — Telephone Encounter (Signed)
 Refill sent in

## 2024-02-23 NOTE — Progress Notes (Addendum)
 Called pt to review appt for port placement. Pt answered stating that he is frustrated about being scheduled for several appointments. Explained to patient that his appts scheduled for next week are for his chemotherapy treatments and pt reminded that when he starts radiation he will be coming everyday Mon-Fri as well. Informed pt that we can tentatively schedule him for port placement next week on Monday 7/14 but pt declined appt stating that he doesn't have anybody to come with him. Pt states that he wants to start chemo without the port for the first cycle and have the port for second cycle. Appt rescheduled to end of July. Pt did not want to further discuss his appts stating that he was getting a headache and couldn't talk anymore. Pt stated to put his appts on his MyChart for him to review. Informed pt that he has an appt on Friday for chemo education and I will meet with him at that appt to review his scheduled appts so he can make transportation arrangements. Nothing further needed at this time.

## 2024-02-24 ENCOUNTER — Other Ambulatory Visit: Payer: Self-pay | Admitting: *Deleted

## 2024-02-24 ENCOUNTER — Other Ambulatory Visit: Payer: Self-pay | Admitting: Nurse Practitioner

## 2024-02-24 ENCOUNTER — Other Ambulatory Visit

## 2024-02-24 ENCOUNTER — Encounter: Payer: Self-pay | Admitting: Family

## 2024-02-24 ENCOUNTER — Other Ambulatory Visit: Payer: Self-pay

## 2024-02-24 ENCOUNTER — Other Ambulatory Visit
Admission: RE | Admit: 2024-02-24 | Discharge: 2024-02-24 | Disposition: A | Discharge status: Home / Self Care | Source: Ambulatory Visit | Attending: Family | Admitting: Family

## 2024-02-24 ENCOUNTER — Ambulatory Visit: Admitting: Family

## 2024-02-24 VITALS — BP 137/88 | HR 75 | Wt 165.0 lb

## 2024-02-24 DIAGNOSIS — I428 Other cardiomyopathies: Secondary | ICD-10-CM | POA: Diagnosis not present

## 2024-02-24 DIAGNOSIS — C3412 Malignant neoplasm of upper lobe, left bronchus or lung: Secondary | ICD-10-CM | POA: Insufficient documentation

## 2024-02-24 DIAGNOSIS — E1122 Type 2 diabetes mellitus with diabetic chronic kidney disease: Secondary | ICD-10-CM | POA: Insufficient documentation

## 2024-02-24 DIAGNOSIS — Z7984 Long term (current) use of oral hypoglycemic drugs: Secondary | ICD-10-CM | POA: Diagnosis not present

## 2024-02-24 DIAGNOSIS — E785 Hyperlipidemia, unspecified: Secondary | ICD-10-CM

## 2024-02-24 DIAGNOSIS — K861 Other chronic pancreatitis: Secondary | ICD-10-CM | POA: Insufficient documentation

## 2024-02-24 DIAGNOSIS — F17210 Nicotine dependence, cigarettes, uncomplicated: Secondary | ICD-10-CM | POA: Insufficient documentation

## 2024-02-24 DIAGNOSIS — I10 Essential (primary) hypertension: Secondary | ICD-10-CM | POA: Diagnosis not present

## 2024-02-24 DIAGNOSIS — I5032 Chronic diastolic (congestive) heart failure: Secondary | ICD-10-CM | POA: Insufficient documentation

## 2024-02-24 DIAGNOSIS — F17290 Nicotine dependence, other tobacco product, uncomplicated: Secondary | ICD-10-CM | POA: Diagnosis not present

## 2024-02-24 DIAGNOSIS — F1290 Cannabis use, unspecified, uncomplicated: Secondary | ICD-10-CM | POA: Diagnosis not present

## 2024-02-24 DIAGNOSIS — E1169 Type 2 diabetes mellitus with other specified complication: Secondary | ICD-10-CM | POA: Diagnosis not present

## 2024-02-24 DIAGNOSIS — E119 Type 2 diabetes mellitus without complications: Secondary | ICD-10-CM

## 2024-02-24 DIAGNOSIS — C3492 Malignant neoplasm of unspecified part of left bronchus or lung: Secondary | ICD-10-CM

## 2024-02-24 DIAGNOSIS — I130 Hypertensive heart and chronic kidney disease with heart failure and stage 1 through stage 4 chronic kidney disease, or unspecified chronic kidney disease: Secondary | ICD-10-CM | POA: Insufficient documentation

## 2024-02-24 DIAGNOSIS — F1990 Other psychoactive substance use, unspecified, uncomplicated: Secondary | ICD-10-CM

## 2024-02-24 DIAGNOSIS — Z833 Family history of diabetes mellitus: Secondary | ICD-10-CM | POA: Insufficient documentation

## 2024-02-24 DIAGNOSIS — N183 Chronic kidney disease, stage 3 unspecified: Secondary | ICD-10-CM | POA: Diagnosis not present

## 2024-02-24 DIAGNOSIS — Z79899 Other long term (current) drug therapy: Secondary | ICD-10-CM | POA: Diagnosis not present

## 2024-02-24 DIAGNOSIS — J449 Chronic obstructive pulmonary disease, unspecified: Secondary | ICD-10-CM | POA: Diagnosis not present

## 2024-02-24 DIAGNOSIS — F1090 Alcohol use, unspecified, uncomplicated: Secondary | ICD-10-CM | POA: Insufficient documentation

## 2024-02-24 LAB — BASIC METABOLIC PANEL WITH GFR
Anion gap: 11 (ref 5–15)
BUN: 14 mg/dL (ref 6–20)
CO2: 24 mmol/L (ref 22–32)
Calcium: 9.3 mg/dL (ref 8.9–10.3)
Chloride: 99 mmol/L (ref 98–111)
Creatinine, Ser: 0.66 mg/dL (ref 0.61–1.24)
GFR, Estimated: >60 mL/min (ref >60)
Glucose, Bld: 122 mg/dL — ABNORMAL HIGH (ref 70–99)
Potassium: 3.8 mmol/L (ref 3.5–5.1)
Sodium: 134 mmol/L — ABNORMAL LOW (ref 135–145)

## 2024-02-24 LAB — BRAIN NATRIURETIC PEPTIDE: B Natriuretic Peptide: 359.6 pg/mL — ABNORMAL HIGH (ref 0.0–100.0)

## 2024-02-24 MED ORDER — SPIRONOLACTONE 25 MG PO TABS
25.0000 mg | ORAL_TABLET | Freq: Every day | ORAL | 3 refills | Status: AC
  Filled 2024-02-24: qty 90, 90d supply, fill #0
  Filled 2024-05-16: qty 90, 90d supply, fill #1

## 2024-02-24 MED ORDER — EMPAGLIFLOZIN 10 MG PO TABS
10.0000 mg | ORAL_TABLET | Freq: Every day | ORAL | 1 refills | Status: DC
  Filled 2024-02-24: qty 90, 90d supply, fill #0
  Filled 2024-05-16: qty 90, 90d supply, fill #1

## 2024-02-24 MED ORDER — EMPAGLIFLOZIN 10 MG PO TABS: 10.0000 mg | ORAL_TABLET | Freq: Every day | ORAL | Status: DC

## 2024-02-24 NOTE — Patient Instructions (Signed)
 Medication Changes:  START Spironolactone  25mg  (1 tab)  REFILLED Jardiance   Lab Work:  Go over to the [facility]. Go pass the gift shop and have your blood work completed TODAY AND IN 10 DAYS.  We will only call you if the results are abnormal or if the provider would like to make medication changes.   Follow-Up in: Please follow up with the [facility] in 1 month with the pharmacist.   At the [facility], you and your health needs are our priority. We have a designated team specialized in the treatment of Heart Failure. This Care Team includes your primary Heart Failure Specialized Cardiologist (physician), Advanced Practice Providers (APPs- Physician Assistants and Nurse Practitioners), and Pharmacist who all work together to provide you with the care you need, when you need it.   You may see any of the following providers on your designated Care Team at your next follow up:  Dr. Toribio Fuel Dr. Ezra Shuck Dr. Ria Commander Dr. Odis Brownie Ellouise Class, FNP Jaun Bash, RPH-CPP  Please be sure to bring in all your medications bottles to every appointment.   Need to Contact Us :  If you have any questions or concerns before your next appointment please send us  a message through [place] or call our office at [phone].    TO LEAVE A MESSAGE FOR THE NURSE SELECT OPTION 2, PLEASE LEAVE A MESSAGE INCLUDING: YOUR NAME DATE OF BIRTH CALL BACK NUMBER REASON FOR CALL**this is important as we prioritize the call backs  YOU WILL RECEIVE A CALL BACK THE SAME DAY AS LONG AS YOU CALL BEFORE 4:00 PM

## 2024-02-24 NOTE — Progress Notes (Signed)
 Pt returned call later in the afternoon on 7/8. Reviewed option for port placement to be scheduled on Friday 7/11 after chemo education class. Pt stated that he cannot make that appt Friday afternoon and requested for port placement be scheduled for Monday 7/14. Pt informed will give him a call back with another appt for port placement. Pt verbalized understanding.

## 2024-02-24 NOTE — Progress Notes (Signed)
 Tumor Board Documentation  Noah Velasquez was presented by [facility] d/t technical issues at our Tumor Board on 02/24/2024, which included representatives from medical oncology, radiation oncology, pathology, navigation, genetics.  Noah Velasquez currently presents as a new patient with history of the following treatments: none.  Additionally, we reviewed previous medical and familial history, history of present illness, and recent lab results along with all available histopathologic and imaging studies. 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 Pending results of humeral lesion. MRI ordered. The following procedures/referrals were also placed: No orders of the defined types were placed in this encounter.  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  Tumor board is a meeting of clinicians from various specialty areas who evaluate and discuss patients for whom a multidisciplinary approach is being considered. Final determinations in the plan of care are those of the provider(s). The responsibility for follow up of recommendations given during tumor board is that of the provider.   Today's extended care, comprehensive team conference, Noah Velasquez was not present for the discussion and was not examined.   Tinnie Dawn, DNP, AGNP-C, AOCNP [facility] [phone] (clinic)

## 2024-02-24 NOTE — Progress Notes (Signed)
 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   Admitted 01/14/24 with wheezing and shortness of breath. On admission, BNP was 317.8, HS-troponin was 26 > 23, UDS positive for cocaine, and TSH was 1.165. Chest x-ray noted pulmonary vascular congestion without airspace edema. 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 Echocardiogram 01/15/24 noted LVEFof 50-55%, mild LVH, grade I diastolic dysfunction. Initially needed oxygen at 2L and unable to be weaned off of it. Brain MRI negative. Bronchoscopy and biopsy to be done as outpatient.   Se[facility] in [facility] 06/25 where jardiance  was started.   He presents today for a HF follow-up visit with a chief complaint of shortness of breath. Has associated fatigue, cough. Has been out of jardiance  for the last week due to financial difficulties.   Denies tobacco use. Drinking 40 oz beer every other day (was drinking 80 oz), marijuana use   ROS: All systems negative except what is listed in HPI, PMH and Problem List   Past Medical History:  Diagnosis Date   Alcohol  use disorder    Aortic atherosclerosis (HCC)    Bronchiolo-alveolar adenocarcinoma of left lung (HCC)    CHF (congestive heart failure) (HCC)    Chronic hip pain    Chronic pancreatitis (HCC)    Cigarette smoker    CKD (chronic kidney disease) stage 3, GFR 30-59 ml/min (HCC)    COPD (chronic obstructive pulmonary disease) (HCC)    Crack cocaine use    as of 01-21-24, pt states he last used a few weeks ago   Depression    DM (diabetes mellitus), type 2 (HCC)    Dyspnea    ED (erectile dysfunction)    ESBL  (extended spectrum beta-lactamase) producing bacteria infection 2021   GERD (gastroesophageal reflux disease)    Hyperlipidemia    Hypertension    ILD (interstitial lung disease) (HCC)    Marijuana use    Osteoarthritis    Pneumonia    PTSD (post-traumatic stress disorder)    Seizure (HCC)    x1 years ago   Viral infection of left eye     Current Outpatient Medications  Medication Sig Dispense Refill   Accu-Chek Softclix Lancets lancets 1 each in the morning, at noon, and at bedtime. May substitute to any manufacturer covered by patient's insurance. 100 each 0   amLODipine -olmesartan  (AZOR ) 10-40 MG tablet Take 1 tablet by mouth daily. 90 tablet 1   Blood Glucose Monitoring Suppl (BLOOD GLUCOSE MONITOR SYSTEM) w/Device KIT 1 each by Does not apply route in the morning, at noon, and at bedtime. May substitute to any manufacturer covered by patient's insurance. 1 kit 0   dexamethasone  (DECADRON ) 4 MG tablet Take 2 tablets (8 mg total) by mouth daily. Take for 1 day starting the day after chemotherapy on day 4. Take with food. 30 tablet 1   empagliflozin  (JARDIANCE ) 10 MG TABS tablet Take 1 tablet (10 mg total) by mouth daily before breakfast.     Glucose Blood (BLOOD GLUCOSE TEST STRIPS) STRP 1 each by In Vitro route in the morning, at noon, and at bedtime. May substitute to any manufacturer covered by patient's insurance. 100 strip 0  ipratropium-albuterol  (DUONEB) 0.5-2.5 (3) MG/3ML SOLN Take 3 mLs by nebulization every 2 (two) hours as needed (wheeze, SOB). Can use instead of albuterol  inhaler 60 mL 1   Lancet Device MISC 1 each by Does not apply route in the morning, at noon, and at bedtime. May substitute to any manufacturer covered by patient's insurance. 1 each 0   lidocaine -prilocaine  (EMLA ) cream Apply to affected area once 30 g 3   lovastatin  (MEVACOR ) 10 MG tablet Take 1 tablet (10 mg total) by mouth at bedtime. 90 tablet 1   metFORMIN  (GLUCOPHAGE ) 500 MG tablet Take 1 tablet (500  mg total) by mouth daily with breakfast. 90 tablet 1   naproxen  (NAPROSYN ) 250 MG tablet Take 250 mg by mouth 2 (two) times daily with a meal.     Nebulizers (COMPRESSOR/NEBULIZER) MISC Nebulizer and tubes/mask to use as directed with DuoNeb as needed 1 each 0   nicotine  (NICODERM CQ  - DOSED IN MG/24 HOURS) 21 mg/24hr patch Place 1 patch (21 mg total) onto the skin daily. 28 patch 0   omeprazole  (PRILOSEC) 20 MG capsule Take 1 capsule (20 mg total) by mouth daily. 90 capsule 1   ondansetron  (ZOFRAN ) 8 MG tablet Take 1 tablet (8 mg total) by mouth every 8 (eight) hours as needed for nausea or vomiting. Start on the third day after cisplatin. 30 tablet 1   OXYGEN Inhale 2 L into the lungs as needed.     Pancrelipase , Lip-Prot-Amyl, (ZENPEP ) 15000-47000 units CPEP Take 1 capsule (15,000 Units total) by mouth 3 (three) times daily. 270 capsule 1   prochlorperazine  (COMPAZINE ) 10 MG tablet Take 1 tablet (10 mg total) by mouth every 6 (six) hours as needed for nausea or vomiting. 30 tablet 1   sertraline  (ZOLOFT ) 100 MG tablet Take 1 tablet (100 mg total) by mouth daily. 90 tablet 0   traZODone  (DESYREL ) 150 MG tablet Take 1 tablet (150 mg total) by mouth at bedtime as needed for sleep. 90 tablet 1   VENTOLIN  HFA 108 (90 Base) MCG/ACT inhaler Inhale 1-2 puffs into the lungs every 6 (six) hours as needed for wheezing or shortness of breath (or coughing). 18 g 6   No current facility-administered medications for this visit.    Allergies  Allergen Reactions   Hctz [Hydrochlorothiazide] Swelling   Ibuprofen Swelling      Social History   Socioeconomic History   Marital status: Legally Separated    Spouse name: Not on file   Number of children: 2   Years of education: Not on file   Highest education level: High school graduate  Occupational History   Occupation: on disability  Tobacco Use   Smoking status: Former    Current packs/day: 3.00    Average packs/day: 3.0 packs/day for 15.0 years  (45.0 ttl pk-yrs)    Types: Cigarettes   Smokeless tobacco: Never   Tobacco comments:    At his heaviest smoked 3ppd and now occasionally smoke 1 cigarette. 02/01/24  Vaping Use   Vaping status: Some Days  Substance and Sexual Activity   Alcohol  use: Yes    Alcohol /week: 7.0 standard drinks of alcohol     Types: 7 Cans of beer per week    Comment: occ   Drug use: Yes    Types: Marijuana, Crack cocaine    Comment: Marijuana every day. 7-44yrs clean of crack cocaine.   Sexual activity: Yes    Birth control/protection: Condom  Other Topics Concern   Not on file  Social  History Narrative   Not on file   Social Drivers of Health   Financial Resource Strain: Not on file  Food Insecurity: No Food Insecurity (01/31/2024)   Hunger Vital Sign    Worried About Running Out of Food in the Last Year: Never true    Ran Out of Food in the Last Year: Never true  Recent Concern: Food Insecurity - Food Insecurity Present (01/15/2024)   Hunger Vital Sign    Worried About Running Out of Food in the Last Year: Sometimes true    Ran Out of Food in the Last Year: Sometimes true  Transportation Needs: No Transportation Needs (01/31/2024)   PRAPARE - Administrator, Civil Service (Medical): No    Lack of Transportation (Non-Medical): No  Physical Activity: Not on file  Stress: Not on file  Social Connections: Not on file  Intimate Partner Violence: Not At Risk (01/31/2024)   Humiliation, Afraid, Rape, and Kick questionnaire    Fear of Current or Ex-Partner: No    Emotionally Abused: No    Physically Abused: No    Sexually Abused: No      Family History  Problem Relation Age of Onset   Diabetes Mother    Sleep apnea Mother    Emphysema Sister    CAD Neg Hx    Mental illness Neg Hx    Vitals:   02/24/24 1454  BP: 137/88  Pulse: 75  SpO2: 95%  Weight: 165 lb (74.8 kg)   Wt Readings from Last 3 Encounters:  02/24/24 165 lb (74.8 kg)  02/21/24 165 lb (74.8 kg)  02/01/24 170  lb 3.2 oz (77.2 kg)   Lab Results  Component Value Date   CREATININE 1.05 01/17/2024   CREATININE 0.94 01/16/2024   CREATININE 0.97 01/15/2024    PHYSICAL EXAM:  General: Well appearing male in wheelchair. No resp difficulty HEENT: normal Neck: supple, no JVD Cor: Regular rhythm, rate. No rubs, gallops or murmurs Lungs: clear Abdomen: soft, nontender, nondistended. Extremities: no cyanosis, clubbing, rash, trace pitting edema bilateral lower legs Neuro: alert & oriented X 3. Moves all 4 extremities w/o difficulty. Affect pleasant   ECG: not done   ASSESSMENT & PLAN:  1: NICM with preserved ejection fraction- - suspect due to HTN - NYHA class II - euvolemic - weight down 12 pounds from last visit here 1 month ago - Echo 01/15/24: LVEFof 50-55%, mild LVH, G1DD - continue amlodipine -olmesartan  10/40mg  daily for now - resume jardiance  10mg  daily (getting at [facility] pharmacy today) - begin spironolactone  25mg  daily - BMET/ BNP today, BMET in 1 week and at next OV - BNP 01/15/24 was 317.8  2: HTN- - BP 137/88 - saw PCP Georgina) - BMET 01/17/24 reviewed: sodium 133, potassium 3.5, creatinine 1.05 & GFR >60 - BMET today  3: Lung cancer left upper lobe- - new diagnosis 05/25 - had bronchoscopy/ biopsy done 01/24/24 - saw pulmonology (Kasa) 06/25 - PET scan done 02/16/24  4: DM- - A1c 12/07/23 was 6.5%  5: Substance use- - no tobacco use - drinking 40 oz beer every other day (was drinking 80 oz every other day) - + marijuana - cessation discussed   6: Hyperlipidemia- - continue lovastatin  10mg  daily - LD 12/07/23 was 59   Return in 1 month, sooner if needed.   Ellouise DELENA Class, FNP 02/24/24

## 2024-02-25 ENCOUNTER — Encounter: Payer: Self-pay | Admitting: *Deleted

## 2024-02-25 ENCOUNTER — Inpatient Hospital Stay

## 2024-02-25 ENCOUNTER — Ambulatory Visit: Payer: Self-pay | Admitting: Family

## 2024-02-25 ENCOUNTER — Other Ambulatory Visit: Payer: Self-pay

## 2024-02-25 DIAGNOSIS — C3412 Malignant neoplasm of upper lobe, left bronchus or lung: Secondary | ICD-10-CM

## 2024-02-25 DIAGNOSIS — I5032 Chronic diastolic (congestive) heart failure: Secondary | ICD-10-CM

## 2024-02-25 NOTE — Progress Notes (Signed)
 Met with patient after chemo education today to review upcoming appts and inform of recommendations from tumor board discussion. All appts reviewed with patient and print out provided for pt to schedule transportation. Reviewed prep instructions for port placement on 7/31 and informed that needs to have friend or family member present with him on the day of his port placement. Informed pt that it is recommended for him to have an MRI scan of his left arm to further characterize the possible bone metastases. Pt agreeable to proceeding with MRI scan on 7/17 after his chemo treatment that day. Pt referred to [facility] to apply for patient assistance and pt informed to expect a call from her. Pt asked if there is any assistance to help him at home with cleaning and meal prep. Informed pt that will send a message to [place] to see if there are any resources available for him. All questions answered during visit. Instructed pt to call with any further questions or needs. Pt verbalized understanding. Nothing further needed at this time.

## 2024-02-28 ENCOUNTER — Telehealth: Payer: Self-pay | Admitting: Pharmacy Technician

## 2024-02-28 ENCOUNTER — Inpatient Hospital Stay

## 2024-02-28 NOTE — Progress Notes (Signed)
 [facility] CSW Progress Note  Clinical Child psychotherapist contacted patient by phone to follow-up on need for community resources.    Interventions: Referred patient to community resources: Cleaning for a [employer], two free house cleanings.  Also discussed the [employer].  He does receive food stamps and appears eligible for the grant.  Sent email to [employer].       Follow Up Plan:  CSW will follow-up with patient by phone     Noah Velasquez Au, LCSW Clinical Social Worker [facility]    Patient is participating in a Managed Medicaid Plan:  Yes

## 2024-02-28 NOTE — Telephone Encounter (Signed)
 Spoke with patient about [employer].  Patient to provide EBT card and sign paperwork when he presents for his appointment on 03/02/24.  Noah Velasquez Patient Services Navigator [facility]

## 2024-02-29 ENCOUNTER — Inpatient Hospital Stay

## 2024-02-29 ENCOUNTER — Encounter: Payer: Self-pay | Admitting: Internal Medicine

## 2024-02-29 ENCOUNTER — Other Ambulatory Visit: Payer: Self-pay

## 2024-02-29 ENCOUNTER — Ambulatory Visit
Admission: RE | Admit: 2024-02-29 | Discharge: 2024-02-29 | Disposition: A | Discharge status: Home / Self Care | Source: Ambulatory Visit | Attending: Radiation Oncology | Admitting: Radiation Oncology

## 2024-02-29 ENCOUNTER — Ambulatory Visit: Payer: Self-pay | Admitting: Internal Medicine

## 2024-02-29 ENCOUNTER — Ambulatory Visit

## 2024-02-29 ENCOUNTER — Encounter: Payer: Self-pay | Admitting: *Deleted

## 2024-02-29 ENCOUNTER — Inpatient Hospital Stay (HOSPITAL_BASED_OUTPATIENT_CLINIC_OR_DEPARTMENT_OTHER): Admitting: Internal Medicine

## 2024-02-29 VITALS — BP 150/80 | HR 82 | Temp 98.6°F | Resp 18 | Ht 67.0 in | Wt 167.4 lb

## 2024-02-29 DIAGNOSIS — Z87891 Personal history of nicotine dependence: Secondary | ICD-10-CM | POA: Diagnosis not present

## 2024-02-29 DIAGNOSIS — Z510 Encounter for antineoplastic radiation therapy: Secondary | ICD-10-CM | POA: Diagnosis not present

## 2024-02-29 DIAGNOSIS — C3412 Malignant neoplasm of upper lobe, left bronchus or lung: Secondary | ICD-10-CM | POA: Insufficient documentation

## 2024-02-29 DIAGNOSIS — Z5111 Encounter for antineoplastic chemotherapy: Secondary | ICD-10-CM | POA: Diagnosis not present

## 2024-02-29 LAB — CBC WITH DIFFERENTIAL (CANCER CENTER ONLY)
Abs Immature Granulocytes: 0.03 K/uL (ref 0.00–0.07)
Basophils Absolute: 0.1 K/uL (ref 0.0–0.1)
Basophils Relative: 1 %
Eosinophils Absolute: 1 K/uL — ABNORMAL HIGH (ref 0.0–0.5)
Eosinophils Relative: 13 %
HCT: 42 % (ref 39.0–52.0)
Hemoglobin: 14 g/dL (ref 13.0–17.0)
Immature Granulocytes: 0 %
Lymphocytes Relative: 29 %
Lymphs Abs: 2.3 K/uL (ref 0.7–4.0)
MCH: 31.2 pg (ref 26.0–34.0)
MCHC: 33.3 g/dL (ref 30.0–36.0)
MCV: 93.5 fL (ref 80.0–100.0)
Monocytes Absolute: 0.5 K/uL (ref 0.1–1.0)
Monocytes Relative: 7 %
Neutro Abs: 4 K/uL (ref 1.7–7.7)
Neutrophils Relative %: 50 %
Platelet Count: 247 K/uL (ref 150–400)
RBC: 4.49 MIL/uL (ref 4.22–5.81)
RDW: 12.7 % (ref 11.5–15.5)
WBC Count: 8 K/uL (ref 4.0–10.5)
nRBC: 0 % (ref 0.0–0.2)

## 2024-02-29 LAB — CMP (CANCER CENTER ONLY)
ALT: 13 U/L (ref 0–44)
AST: 30 U/L (ref 15–41)
Albumin: 3.4 g/dL — ABNORMAL LOW (ref 3.5–5.0)
Alkaline Phosphatase: 76 U/L (ref 38–126)
Anion gap: 7 (ref 5–15)
BUN: 16 mg/dL (ref 6–20)
CO2: 22 mmol/L (ref 22–32)
Calcium: 8.5 mg/dL — ABNORMAL LOW (ref 8.9–10.3)
Chloride: 101 mmol/L (ref 98–111)
Creatinine: 1 mg/dL (ref 0.61–1.24)
GFR, Estimated: >60 mL/min (ref >60)
Glucose, Bld: 190 mg/dL — ABNORMAL HIGH (ref 70–99)
Potassium: 4.1 mmol/L (ref 3.5–5.1)
Sodium: 130 mmol/L — ABNORMAL LOW (ref 135–145)
Total Bilirubin: 0.5 mg/dL (ref 0.0–1.2)
Total Protein: 8.5 g/dL — ABNORMAL HIGH (ref 6.5–8.1)

## 2024-02-29 LAB — MAGNESIUM: Magnesium: 1.7 mg/dL (ref 1.7–2.4)

## 2024-02-29 MED ORDER — ACCU-CHEK SOFTCLIX LANCETS MISC
0 refills | Status: AC
  Filled 2024-02-29: qty 100, 30d supply, fill #0

## 2024-02-29 MED ORDER — BLOOD GLUCOSE MONITOR SYSTEM W/DEVICE KIT
PACK | 0 refills | Status: AC
  Filled 2024-02-29: qty 1, 30d supply, fill #0

## 2024-02-29 NOTE — Progress Notes (Addendum)
 Met with patient during follow up visit with Dr. Rennie and initial visit with Dr. Lenn. All questions answered during visit. Pt needs refill of his Jardiance . Spoke to [facility] who informed that a refill has been sent in on 7/10 to the [facility] pharmacy. Pt needs a glucometer and states that the one he has currently does not work properly. Will reach out to patient assistance to see if able to cover cost of replacement glucometer with patient assistance fund. Will follow up with patient at his visit tomorrow during chemotherapy treatment. Nothing else further needed at this time.

## 2024-02-29 NOTE — Telephone Encounter (Signed)
 Called and spoke to pt about blood work and starting new medication. Pt had yet to start spironolactone . Advise that he could start. Also reviewed other medications on his medication list. Pt verbalized understanding and is agreeable to plan and labs in 1 week. No further questions at this time.

## 2024-02-29 NOTE — Telephone Encounter (Signed)
-----   Message from Ellouise DELENA Class sent at 02/28/2024  9:48 AM EDT ----- Please call him with his unread mychart message.  ----- Message ----- From: Rebecka, Lab [facility] [place] Sent: 02/24/2024   4:34 PM EDT To: Ellouise DELENA Class, FNP

## 2024-02-29 NOTE — Consult Note (Signed)
 NEW PATIENT EVALUATION  Name: Noah Velasquez  MRN: [id number]  Date:   02/29/2024     DOB: 11/17/1965   This 58 y.o. 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  REFERRING PHYSICIAN: Antonette Angeline ORN, NP  CHIEF COMPLAINT:  Chief Complaint  Patient presents with   Lung Cancer    DIAGNOSIS: The encounter diagnosis was Small cell lung cancer, left upper lobe (HCC).   PREVIOUS INVESTIGATIONS:  CT scans MRI of brain and PET scans reviewed Clinical notes reviewed Pathology reports reviewed Case presented at weekly tumor board  HPI: Patient is a 58 year old male who presents with increasing shortness of breath dyspnea on exertion and cough.  He was seen in the ER where CT scan of his chest showed a 3.7 x 1.8 cm lobulated mass in left upper lobe abutting the chest wall suspicious for primary bronchogenic carcinoma there was also satellite nodularity.  He also had enlarged mediastinal nodes suspicious for metastatic disease.  Brain MRI confirmed no evidence of intracranial abnormality.  Bronchoscopy was performed with pathology positive for poorly differentiated carcinoma consistent with small cell carcinoma.  PET CT scan of his chest again showed left upper lobe peripheral lung mass with several satellite lesions in the left upper lobe.  There is also hypermetabolic nodes along the left side of the mediastinum including prevascular AP window consistent with regional nodal metastasis.  Patient did have an isolated bony area of abnormal uptake on the proximal humeral shaft and has an MRI scan scheduled for later this week.  He has been seen by medical oncology and case presents in our tumor conference with recommendation to proceed as a stage IIIb limited stage small cell lung cancer.  He is seen today for radiation oncology consultation.  He continues to have a slight cough no hemoptysis or chest  tightness.  He is having no bone pain in his left upper extremity.  Patient uses a motorized wheelchair secondary to arthritis.  PLANNED TREATMENT REGIMEN: Concurrent chemoradiation  PAST MEDICAL HISTORY:  has a past medical history of Alcohol  use disorder, Aortic atherosclerosis (HCC), Bronchiolo-alveolar adenocarcinoma of left lung (HCC), CHF (congestive heart failure) (HCC), Chronic hip pain, Chronic pancreatitis (HCC), Cigarette smoker, CKD (chronic kidney disease) stage 3, GFR 30-59 ml/min (HCC), COPD (chronic obstructive pulmonary disease) (HCC), Crack cocaine use, Depression, DM (diabetes mellitus), type 2 (HCC), Dyspnea, ED (erectile dysfunction), ESBL (extended spectrum beta-lactamase) producing bacteria infection (2021), GERD (gastroesophageal reflux disease), Hyperlipidemia, Hypertension, ILD (interstitial lung disease) (HCC), Marijuana use, Osteoarthritis, Pneumonia, PTSD (post-traumatic stress disorder), Seizure (HCC), and Viral infection of left eye.    PAST SURGICAL HISTORY:  Past Surgical History:  Procedure Laterality Date   BRONCHOSCOPY, WITH BIOPSY USING ELECTROMAGNETIC NAVIGATION Left 01/24/2024   Procedure: BRONCHOSCOPY, WITH BIOPSY USING ELECTROMAGNETIC NAVIGATION;  Surgeon: Isaiah Scrivener, MD;  Location: [facility] ORS;  Service: Pulmonary;  Laterality: Left;   ENDOBRONCHIAL ULTRASOUND Right 01/24/2024   Procedure: ENDOBRONCHIAL ULTRASOUND (EBUS);  Surgeon: Kasa, Kurian, MD;  Location: [facility] ORS;  Service: Pulmonary;  Laterality: Right;   pancreatic stent placement      FAMILY HISTORY: family history includes Diabetes in his mother; Emphysema in his sister; Sleep apnea in his mother.  SOCIAL HISTORY:  reports that he has quit smoking. His smoking use included cigarettes. He has a 45 pack-year smoking history. He has never used smokeless tobacco. He reports current alcohol  use of about 7.0  standard drinks of alcohol  per week. He reports current drug use. Drugs: Marijuana and Crack  cocaine.  ALLERGIES: Hctz [hydrochlorothiazide] and Ibuprofen  MEDICATIONS:  Current Outpatient Medications  Medication Sig Dispense Refill   Accu-Chek Softclix Lancets lancets 1 each in the morning, at noon, and at bedtime. May substitute to any manufacturer covered by patient's insurance. 100 each 0   amLODipine -olmesartan  (AZOR ) 10-40 MG tablet Take 1 tablet by mouth daily. 90 tablet 1   Blood Glucose Monitoring Suppl (BLOOD GLUCOSE MONITOR SYSTEM) w/Device KIT 1 each by Does not apply route in the morning, at noon, and at bedtime. May substitute to any manufacturer covered by patient's insurance. 1 kit 0   dexamethasone  (DECADRON ) 4 MG tablet Take 2 tablets (8 mg total) by mouth daily. Take for 1 day starting the day after chemotherapy on day 4. Take with food. 30 tablet 1   empagliflozin  (JARDIANCE ) 10 MG TABS tablet Take 1 tablet (10 mg total) by mouth daily before breakfast. 90 tablet 1   ipratropium-albuterol  (DUONEB) 0.5-2.5 (3) MG/3ML SOLN Take 3 mLs by nebulization every 2 (two) hours as needed (wheeze, SOB). Can use instead of albuterol  inhaler 60 mL 1   lidocaine -prilocaine  (EMLA ) cream Apply to affected area once 30 g 3   lovastatin  (MEVACOR ) 10 MG tablet Take 1 tablet (10 mg total) by mouth at bedtime. 90 tablet 1   metFORMIN  (GLUCOPHAGE ) 500 MG tablet Take 1 tablet (500 mg total) by mouth daily with breakfast. 90 tablet 1   naproxen  (NAPROSYN ) 250 MG tablet Take 250 mg by mouth 2 (two) times daily with a meal.     Nebulizers (COMPRESSOR/NEBULIZER) MISC Nebulizer and tubes/mask to use as directed with DuoNeb as needed 1 each 0   nicotine  (NICODERM CQ  - DOSED IN MG/24 HOURS) 21 mg/24hr patch Place 1 patch (21 mg total) onto the skin daily. 28 patch 0   omeprazole  (PRILOSEC) 20 MG capsule Take 1 capsule (20 mg total) by mouth daily. 90 capsule 1   ondansetron  (ZOFRAN ) 8 MG tablet Take 1 tablet (8 mg total) by mouth every 8 (eight) hours as needed for nausea or vomiting. Start on the  third day after cisplatin . 30 tablet 1   OXYGEN Inhale 2 L into the lungs as needed.     Pancrelipase , Lip-Prot-Amyl, (ZENPEP ) 15000-47000 units CPEP Take 1 capsule (15,000 Units total) by mouth 3 (three) times daily. 270 capsule 1   prochlorperazine  (COMPAZINE ) 10 MG tablet Take 1 tablet (10 mg total) by mouth every 6 (six) hours as needed for nausea or vomiting. 30 tablet 1   sertraline  (ZOLOFT ) 100 MG tablet Take 1 tablet (100 mg total) by mouth daily. 90 tablet 0   spironolactone  (ALDACTONE ) 25 MG tablet Take 1 tablet (25 mg total) by mouth daily. 90 tablet 3   traZODone  (DESYREL ) 150 MG tablet Take 1 tablet (150 mg total) by mouth at bedtime as needed for sleep. 90 tablet 1   VENTOLIN  HFA 108 (90 Base) MCG/ACT inhaler Inhale 1-2 puffs into the lungs every 6 (six) hours as needed for wheezing or shortness of breath (or coughing). 18 g 6   No current facility-administered medications for this encounter.    ECOG PERFORMANCE STATUS:  1 - Symptomatic but completely ambulatory  REVIEW OF SYSTEMS: Patient denies any weight loss, fatigue, weakness, fever, chills or night sweats. Patient denies any loss of vision, blurred vision. Patient denies any ringing  of the ears or hearing loss. No irregular heartbeat. Patient denies heart murmur  or history of fainting. Patient denies any chest pain or pain radiating to her upper extremities. Patient denies any shortness of breath, difficulty breathing at night, cough or hemoptysis. Patient denies any swelling in the lower legs. Patient denies any nausea vomiting, vomiting of blood, or coffee ground material in the vomitus. Patient denies any stomach pain. Patient states has had normal bowel movements no significant constipation or diarrhea. Patient denies any dysuria, hematuria or significant nocturia. Patient denies any problems walking, swelling in the joints or loss of balance. Patient denies any skin changes, loss of hair or loss of weight. Patient denies any  excessive worrying or anxiety or significant depression. Patient denies any problems with insomnia. Patient denies excessive thirst, polyuria, polydipsia. Patient denies any swollen glands, patient denies easy bruising or easy bleeding. Patient denies any recent infections, allergies or URI. Patient s visual fields have not changed significantly in recent time.   PHYSICAL EXAM: There were no vitals taken for this visit. Patient is wheelchair-bound.  Well-developed well-nourished patient in NAD. HEENT reveals PERLA, EOMI, discs not visualized.  Oral cavity is clear. No oral mucosal lesions are identified. Neck is clear without evidence of cervical or supraclavicular adenopathy. Lungs are clear to A&P. Cardiac examination is essentially unremarkable with regular rate and rhythm without murmur rub or thrill. Abdomen is benign with no organomegaly or masses noted. Motor sensory and DTR levels are equal and symmetric in the upper and lower extremities. Cranial nerves II through XII are grossly intact. Proprioception is intact. No peripheral adenopathy or edema is identified. No motor or sensory levels are noted. Crude visual fields are within normal range.  LABORATORY DATA: Pathology reports reviewed    RADIOLOGY RESULTS: CT scans PET CT scans MRI of brain reviewed MRI of left upper extremity to be reviewed when complete   IMPRESSION: Limited stage IIIb small cell lung cancer in a 58 year old male  PLAN: At this time we will await MRI scan of his left upper extremity.  At tumor where we decided to proceed his stage IIIb limited stage small cell lung cancer.  I will plan on delivering 60 [place] to his primary tumor and hypermetabolic mediastinal nodes.  I would use IMRT treatment planning and delivery to spare critical structures such as his normal lung volume spine heart and esophagus.  Risks and benefits of treatment occluding possible development of more of a cough fatigue alteration of blood counts  possible radiation esophagitis all were discussed in detail with the patient.  I have personally set up and ordered CT simulation for early next week.  We will coordinate his chemotherapy with medical oncology.  There will be extra effort by both professional staff as well as technical staff to coordinate and manage concurrent chemoradiation and ensuing side effects during his treatments. Patient comprehends my recommendations well.  I would like to take this opportunity to thank you for allowing me to participate in the care of your patient.SABRA Marcey Penton, MD

## 2024-02-29 NOTE — Progress Notes (Signed)
 Rx for glucometer and lancets sent to [facility] pharmacy per Dr. Rennie. Glucometer and lancets should be covered under insurance but any remaining cost can be covered under patient assistance grant per [employer]. Will update pt at chemotherapy appt tomorrow morning. Nothing further needed at this time.

## 2024-02-29 NOTE — Assessment & Plan Note (Addendum)
#   SMALL CELL LUNG CANCER-  3.7 x 1.8 cm lobulated mass in the left upper lobe abutting the chest wall, suspicious for primary bronchogenic carcinoma, with adjacent satellite Nodularity;  14 mm short axis right paratracheal node and a 14 mm short axis prevascular node, suspicious for thoracic nodal metastases. MRI Brain- Neg.   # PET scan shows-above findings however- nonspecific area of uptake along the proximal left humeral shaft with an SUV of 9.3 which was unclear if it represents metastatic disease.  This was reviewed at the tumor conference.  Patient awaiting MRI of his left humerus.   # For now proceed with  limited stage small cell lung cancer with T2 N2b disease-proceed with cisplatin  [60 mg/m2 dose- sec to PS/PN] etoposide  chemotherapy.  Patient awaiting radiation oncology evaluation and concurrent radiation.  # #  I reviewed at length the individual components with chemotherapy; and the schedule in detail.  I also discussed the potential side effects including but not limited to-increasing fatigue, nausea vomiting, diarrhea, hair loss, sores in the mouth, increase risk of infection and also neuropathy.  Understand chemotherapy  will be given with curative intent however the potential chance of cure this quite small.  # If patient is truly-stage III [while awaiting on the left humerus MRI]-consider immunotherapy post chemoradiation.    # NICM with preserved ejection fraction- suspect due to HTN- NYHA class II [Tina Hackney]- aldactone , jardiance -  stable.   # COPD-inhalers  prn home O2  qhs prn- stable.   # DM- on metformin -lancet device broken- talk to pharmacy-   BG TID-understands that blood sugars can go high on chemotherapy.- recommend checking-  # IV access: PIV; awaiting port placement prior to second cycle.   # DISPOSITION: # chemo as planned tomorrow- and this week.  # follow up in 3 weeks- MD port- labs- cbc/cmp; mag; Dr.Rao; chemo-days- 1-3.- Dr.B

## 2024-02-29 NOTE — Addendum Note (Signed)
 Addended by: VERDENE GILLS on: 02/29/2024 03:57 PM   Modules accepted: Orders

## 2024-02-29 NOTE — Progress Notes (Signed)
 [facility] CONSULT NOTE  Patient Care Team: Antonette Angeline ORN, NP as PCP - General (Internal Medicine) Verdene Gills, RN as Oncology Nurse Navigator [name], MD as Consulting Physician (Oncology)  CHIEF COMPLAINTS/PURPOSE OF CONSULTATION:  Lung cancer  Oncology History  Small cell lung cancer, left upper lobe (HCC)  02/21/2024 Initial Diagnosis   Small cell lung cancer, left upper lobe (HCC)   02/21/2024 Cancer Staging   Staging form: Lung, AJCC V9 - Clinical stage from 02/21/2024: Stage IIIB (cT2, cN2b, cM0) - Signed by [name], MD on 02/21/2024 Method of lymph node assessment: Clinical   02/29/2024 -  Chemotherapy   Patient is on Treatment Plan : LUNG SMALL CELL Cisplatin  (80) D1 + Etoposide  (100) D1-3 q21d        HISTORY OF PRESENTING ILLNESS: Patient ambulating-in a motorized wheelchair secondary arthritis with assistance.  Alone   Noah Velasquez 58 y.o.  male pleasant patient with multiple medical problems--and newly diagnosed small cell lung cancer is here for follow-up.  Patient complains of ongoing pain in his hip-which is chronic.  Patient denies any worsening cough or shortness of breath or chest pain denies a headache denies any nausea vomiting.    Review of Systems  Constitutional:  Positive for malaise/fatigue and weight loss. Negative for chills, diaphoresis and fever.  HENT:  Negative for nosebleeds and sore throat.   Eyes:  Negative for double vision.  Respiratory:  Positive for cough and shortness of breath. Negative for hemoptysis, sputum production and wheezing.   Cardiovascular:  Negative for chest pain, palpitations, orthopnea and leg swelling.  Gastrointestinal:  Negative for abdominal pain, blood in stool, constipation, diarrhea, heartburn, melena, nausea and vomiting.  Genitourinary:  Negative for dysuria, frequency and urgency.  Musculoskeletal:  Positive for back pain and joint pain.  Skin: Negative.  Negative for itching and  rash.  Neurological:  Negative for dizziness, tingling, focal weakness, weakness and headaches.  Endo/Heme/Allergies:  Does not bruise/bleed easily.  Psychiatric/Behavioral:  Negative for depression. The patient is not nervous/anxious and does not have insomnia.     MEDICAL HISTORY:  Past Medical History:  Diagnosis Date   Alcohol  use disorder    Aortic atherosclerosis (HCC)    Bronchiolo-alveolar adenocarcinoma of left lung (HCC)    CHF (congestive heart failure) (HCC)    Chronic hip pain    Chronic pancreatitis (HCC)    Cigarette smoker    CKD (chronic kidney disease) stage 3, GFR 30-59 ml/min (HCC)    COPD (chronic obstructive pulmonary disease) (HCC)    Crack cocaine use    as of 01-21-24, pt states he last used a few weeks ago   Depression    DM (diabetes mellitus), type 2 (HCC)    Dyspnea    ED (erectile dysfunction)    ESBL (extended spectrum beta-lactamase) producing bacteria infection 2021   GERD (gastroesophageal reflux disease)    Hyperlipidemia    Hypertension    ILD (interstitial lung disease) (HCC)    Marijuana use    Osteoarthritis    Pneumonia    PTSD (post-traumatic stress disorder)    Seizure (HCC)    x1 years ago   Viral infection of left eye     SURGICAL HISTORY: Past Surgical History:  Procedure Laterality Date   BRONCHOSCOPY, WITH BIOPSY USING ELECTROMAGNETIC NAVIGATION Left 01/24/2024   Procedure: BRONCHOSCOPY, WITH BIOPSY USING ELECTROMAGNETIC NAVIGATION;  Surgeon: Isaiah Scrivener, MD;  Location: [facility] ORS;  Service: Pulmonary;  Laterality: Left;  ENDOBRONCHIAL ULTRASOUND Right 01/24/2024   Procedure: ENDOBRONCHIAL ULTRASOUND (EBUS);  Surgeon: Kasa, Kurian, MD;  Location: [facility] ORS;  Service: Pulmonary;  Laterality: Right;   pancreatic stent placement      SOCIAL HISTORY: Social History   Socioeconomic History   Marital status: Legally Separated    Spouse name: Not on file   Number of children: 2   Years of education: Not on file   Highest  education level: High school graduate  Occupational History   Occupation: on disability  Tobacco Use   Smoking status: Former    Current packs/day: 3.00    Average packs/day: 3.0 packs/day for 15.0 years (45.0 ttl pk-yrs)    Types: Cigarettes   Smokeless tobacco: Never   Tobacco comments:    At his heaviest smoked 3ppd and now occasionally smoke 1 cigarette. 02/01/24  Vaping Use   Vaping status: Some Days  Substance and Sexual Activity   Alcohol  use: Yes    Alcohol /week: 7.0 standard drinks of alcohol     Types: 7 Cans of beer per week    Comment: occ   Drug use: Yes    Types: Marijuana, Crack cocaine    Comment: Marijuana every day. 7-24yrs clean of crack cocaine.   Sexual activity: Yes    Birth control/protection: Condom  Other Topics Concern   Not on file  Social History Narrative   Not on file   Social Drivers of Health   Financial Resource Strain: Not on file  Food Insecurity: No Food Insecurity (01/31/2024)   Hunger Vital Sign    Worried About Running Out of Food in the Last Year: Never true    Ran Out of Food in the Last Year: Never true  Recent Concern: Food Insecurity - Food Insecurity Present (01/15/2024)   Hunger Vital Sign    Worried About Running Out of Food in the Last Year: Sometimes true    Ran Out of Food in the Last Year: Sometimes true  Transportation Needs: No Transportation Needs (01/31/2024)   PRAPARE - Administrator, Civil Service (Medical): No    Lack of Transportation (Non-Medical): No  Physical Activity: Not on file  Stress: Not on file  Social Connections: Not on file  Intimate Partner Violence: Not At Risk (01/31/2024)   Humiliation, Afraid, Rape, and Kick questionnaire    Fear of Current or Ex-Partner: No    Emotionally Abused: No    Physically Abused: No    Sexually Abused: No    FAMILY HISTORY: Family History  Problem Relation Age of Onset   Diabetes Mother    Sleep apnea Mother    Emphysema Sister    CAD Neg Hx     Mental illness Neg Hx     ALLERGIES:  is allergic to hctz [hydrochlorothiazide] and ibuprofen.  MEDICATIONS:  Current Outpatient Medications  Medication Sig Dispense Refill   Accu-Chek Softclix Lancets lancets 1 each in the morning, at noon, and at bedtime. May substitute to any manufacturer covered by patient's insurance. 100 each 0   amLODipine -olmesartan  (AZOR ) 10-40 MG tablet Take 1 tablet by mouth daily. 90 tablet 1   Blood Glucose Monitoring Suppl (BLOOD GLUCOSE MONITOR SYSTEM) w/Device KIT 1 each by Does not apply route in the morning, at noon, and at bedtime. May substitute to any manufacturer covered by patient's insurance. 1 kit 0   dexamethasone  (DECADRON ) 4 MG tablet Take 2 tablets (8 mg total) by mouth daily. Take for 1 day starting the day after chemotherapy on  day 4. Take with food. 30 tablet 1   empagliflozin  (JARDIANCE ) 10 MG TABS tablet Take 1 tablet (10 mg total) by mouth daily before breakfast. 90 tablet 1   ipratropium-albuterol  (DUONEB) 0.5-2.5 (3) MG/3ML SOLN Take 3 mLs by nebulization every 2 (two) hours as needed (wheeze, SOB). Can use instead of albuterol  inhaler 60 mL 1   lidocaine -prilocaine  (EMLA ) cream Apply to affected area once 30 g 3   lovastatin  (MEVACOR ) 10 MG tablet Take 1 tablet (10 mg total) by mouth at bedtime. 90 tablet 1   metFORMIN  (GLUCOPHAGE ) 500 MG tablet Take 1 tablet (500 mg total) by mouth daily with breakfast. 90 tablet 1   naproxen  (NAPROSYN ) 250 MG tablet Take 250 mg by mouth 2 (two) times daily with a meal.     Nebulizers (COMPRESSOR/NEBULIZER) MISC Nebulizer and tubes/mask to use as directed with DuoNeb as needed 1 each 0   nicotine  (NICODERM CQ  - DOSED IN MG/24 HOURS) 21 mg/24hr patch Place 1 patch (21 mg total) onto the skin daily. 28 patch 0   omeprazole  (PRILOSEC) 20 MG capsule Take 1 capsule (20 mg total) by mouth daily. 90 capsule 1   ondansetron  (ZOFRAN ) 8 MG tablet Take 1 tablet (8 mg total) by mouth every 8 (eight) hours as needed for  nausea or vomiting. Start on the third day after cisplatin . 30 tablet 1   OXYGEN Inhale 2 L into the lungs as needed.     Pancrelipase , Lip-Prot-Amyl, (ZENPEP ) 15000-47000 units CPEP Take 1 capsule (15,000 Units total) by mouth 3 (three) times daily. 270 capsule 1   prochlorperazine  (COMPAZINE ) 10 MG tablet Take 1 tablet (10 mg total) by mouth every 6 (six) hours as needed for nausea or vomiting. 30 tablet 1   sertraline  (ZOLOFT ) 100 MG tablet Take 1 tablet (100 mg total) by mouth daily. 90 tablet 0   spironolactone  (ALDACTONE ) 25 MG tablet Take 1 tablet (25 mg total) by mouth daily. 90 tablet 3   traZODone  (DESYREL ) 150 MG tablet Take 1 tablet (150 mg total) by mouth at bedtime as needed for sleep. 90 tablet 1   VENTOLIN  HFA 108 (90 Base) MCG/ACT inhaler Inhale 1-2 puffs into the lungs every 6 (six) hours as needed for wheezing or shortness of breath (or coughing). 18 g 6   No current facility-administered medications for this visit.    PHYSICAL EXAMINATION:   Vitals:   02/29/24 0913 02/29/24 0921  BP: (!) 159/85 (!) 150/80  Pulse: 82   Resp: 18   Temp: 98.6 F (37 C)   SpO2: 95%    Filed Weights   02/29/24 0913  Weight: 167 lb 6.4 oz (75.9 kg)    Physical Exam Vitals and nursing note reviewed.  HENT:     Head: Normocephalic and atraumatic.     Mouth/Throat:     Pharynx: Oropharynx is clear.  Eyes:     Extraocular Movements: Extraocular movements intact.     Pupils: Pupils are equal, round, and reactive to light.  Cardiovascular:     Rate and Rhythm: Normal rate and regular rhythm.  Pulmonary:     Comments: Decreased breath sounds bilaterally.  Abdominal:     Palpations: Abdomen is soft.  Musculoskeletal:        General: Normal range of motion.     Cervical back: Normal range of motion.  Skin:    General: Skin is warm.  Neurological:     General: No focal deficit present.     Mental Status: He  is alert and oriented to person, place, and time.  Psychiatric:         Behavior: Behavior normal.        Judgment: Judgment normal.     LABORATORY DATA:  I have reviewed the data as listed Lab Results  Component Value Date   WBC 8.0 02/29/2024   HGB 14.0 02/29/2024   HCT 42.0 02/29/2024   MCV 93.5 02/29/2024   PLT 247 02/29/2024   Recent Labs    12/07/23 1334 01/15/24 0211 01/16/24 0702 01/17/24 1043 02/24/24 1555 02/29/24 0851  NA 135 131*   < > 133* 134* 130*  K 4.5 3.9   < > 3.5 3.8 4.1  CL 101 101   < > 97* 99 101  CO2 25 21*   < > 25 24 22   GLUCOSE 104 177*   < > 172* 122* 190*  BUN 10 11   < > 20 14 16   CREATININE 1.03 0.97   < > 1.05 0.66 1.00  CALCIUM  9.4 8.7*   < > 8.6* 9.3 8.5*  GFRNONAA  --  >60   < > >60 >60 >60  PROT 9.2* 8.7*  --   --   --  8.5*  ALBUMIN  --  3.1*  --   --   --  3.4*  AST 24 25  --   --   --  30  ALT 13 15  --   --   --  13  ALKPHOS  --  62  --   --   --  76  BILITOT 0.7 0.4  --   --   --  0.5   < > = values in this interval not displayed.    RADIOGRAPHIC STUDIES: I have personally reviewed the radiological images as listed and agreed with the findings in the report. NM PET Image Initial (PI) Skull Base To Thigh Result Date: 02/17/2024 CLINICAL DATA:  Initial treatment strategy for lung nodule. EXAM: NUCLEAR MEDICINE PET SKULL BASE TO THIGH TECHNIQUE: 9.68 mCi F-18 FDG was injected intravenously. Full-ring PET imaging was performed from the skull base to thigh after the radiotracer. CT data was obtained and used for attenuation correction and anatomic localization. Fasting blood glucose: 125 mg/dl COMPARISON:  Chest CT without contrast 01/16/2024. Abdominal imaging 2023 and older FINDINGS: Mediastinal blood pool activity: SUV max 2.0 Liver activity: SUV max 2.7 NECK: There is some physiologic uptake along the strap muscles in the lung neck bilaterally. No specific abnormal uptake identified above blood pool in the neck otherwise including along lymph node change of the submandibular, posterior triangle or  internal jugular region. Incidental CT findings: The parotid glands, submandibular glands and thyroid  glands unremarkable. Visualized portions of the paranasal sinuses and mastoid air cells are grossly clear. Scattered vascular calcifications are seen. CHEST: On the prior CT scan was a multilobular peripheral left upper lobe mass abutting the pleura. This is again seen today and is similar in appearance by CT, such as series 6, image 45. This area has uptake of maximum SUV value of 15.7 consistent with neoplasm. There are some additional small adjacent nodules in the left upper lobe including just posteromedial which also show abnormal uptake and could be satellite lesions. Example with maximum SUV of 3.4 is seen on CT image 45 measuring 8 mm. Another example on image 49 today more anteromedial adjacent to the hilum measures 9 mm and has uptake of maximum SUV of 7.4. Few other foci as well left  upper lobe. No other areas of abnormal lung uptake. There are several left-sided upper mediastinal hypermetabolic nodes. These include AP window measuring maximum SUV of 13.8 and dimension on image 49 today at 3.0 by 2.3 cm. Focus to the left of the ascending aorta has dimension on image 44 measuring 3.6 by 1.7 cm. Maximum SUV of 15.9. There are some small nodes more superior to this at the level of the great vessels which has some low-level uptake and are suspicious. Additional suspicious nodes right paratracheal with maximum SUV value of 4.4 and dimension on image 43 a short axis of 14 mm. Suspicious subcarinal nodes as well. No significant abnormal uptake in the axilla. Incidental CT findings: Heart is nonenlarged. Prominent vascular calcifications including along the coronary arteries. Please correlate for other coronary risk factors. Bovine type aortic arch, normal variant. Patulous esophagus with a small hiatal hernia. Emphysematous lung changes identified greatest in the upper lung zones. There is some dependent  atelectasis. Breathing motion. No pleural effusion. ABDOMEN/PELVIS: There is physiologic distribution radiotracer along the parenchymal organs, bowel and renal collecting systems. Low-level uptake along right inguinal nodes which have maximum SUV value of 3.7 for example. Node in this location has short axis of 11 mm on series 6, image 133. Incidental CT findings: On this limited noncontrast CT scan for attenuation correction, grossly the liver, spleen, adrenal glands are unremarkable. Dystrophic calcifications along the atrophic pancreas with pancreatic dilatation. Please correlate for any history of chronic calcific pancreatitis or other process. This was described previously. Right-sided greater than left renal atrophy. No renal or ureteral stones clearly identified. Preserved contour to the urinary bladder. Underdistended stomach. Small bowel is nondilated. Normal retrocecal appendix. Large bowel has a normal course and caliber with scattered stool. Breathing motion. Diffuse vascular calcifications identified along the aorta and branch vessels. Most extensive along the common iliac vessels, right peer than left. SKELETON: There is a area of uptake along the proximal left humeral shaft which has maximum SUV value of 9.3. Slightly heterogeneous appearance of the marrow on CT in this location. No other areas of abnormal bony uptake identified along the visualized skeleton. This could be an isolated bony metastasis. Incidental CT findings: Diffuse degenerative changes identified. Particularly advanced are changes along the right hip with subchondral cyst formation, severe joint space loss and osteophytes. IMPRESSION: Left upper lobe peripheral lung mass again identified and has significant abnormal uptake consistent with neoplasm. There are several satellite lesions which show some uptake in the left upper lobe as well. Several significant hypermetabolic nodes along the left side of the mediastinum including  prevascular, AP window. Consistent with regional nodal metastasis. In addition there are several other enlarged but smaller nodes in the mediastinum including right paratracheal and subcarinal which have been slight increased uptake but much less than the aforementioned nodes in the primary lung lesion. Although these also could represent early areas of spread of disease these were also have a differential. There is a similar appearing node in the right inguinal region as well. Attention on follow-up versus additional workup. Isolated bony area of abnormal uptake along the proximal humeral shaft with slight heterogeneity on CT in this location. An isolated bony metastasis is possible. Please correlate for any known history. Dedicated humerus x-rays may be of some benefit. Electronically Signed   By: Ranell Bring M.D.   On: 02/17/2024 15:27     Small cell lung cancer, left upper lobe (HCC) # SMALL CELL LUNG CANCER-  3.7 x 1.8 cm  lobulated mass in the left upper lobe abutting the chest wall, suspicious for primary bronchogenic carcinoma, with adjacent satellite Nodularity;  14 mm short axis right paratracheal node and a 14 mm short axis prevascular node, suspicious for thoracic nodal metastases. MRI Brain- Neg.   # PET scan shows-above findings however- nonspecific area of uptake along the proximal left humeral shaft with an SUV of 9.3 which was unclear if it represents metastatic disease.  This was reviewed at the tumor conference.  Patient awaiting MRI of his left humerus.   # For now proceed with  limited stage small cell lung cancer with T2 N2b disease-proceed with cisplatin  [60 mg/m2 dose- sec to PS/PN] etoposide  chemotherapy.  Patient awaiting radiation oncology evaluation and concurrent radiation.  # #  I reviewed at length the individual components with chemotherapy; and the schedule in detail.  I also discussed the potential side effects including but not limited to-increasing fatigue, nausea  vomiting, diarrhea, hair loss, sores in the mouth, increase risk of infection and also neuropathy.  Understand chemotherapy  will be given with curative intent however the potential chance of cure this quite small.  # If patient is truly-stage III [while awaiting on the left humerus MRI]-consider immunotherapy post chemoradiation.    # NICM with preserved ejection fraction- suspect due to HTN- NYHA class II [Tina Hackney]- aldactone , jardiance -  stable.   # COPD-inhalers  prn home O2  qhs prn- stable.   # DM- on metformin -lancet device broken- talk to pharmacy-   BG TID-understands that blood sugars can go high on chemotherapy.- recommend checking-  # IV access: PIV; awaiting port placement prior to second cycle.   # DISPOSITION: # chemo as planned tomorrow- and this week.  # follow up in 3 weeks- MD port- labs- cbc/cmp; mag; Dr.Rao; chemo-days- 1-3.- Dr.B  Above plan of care was discussed with patient/family in detail.  My contact information was given to the patient/family.     Cindy JONELLE Joe, MD 02/29/2024 9:59 AM

## 2024-02-29 NOTE — Progress Notes (Signed)
 Patient reports today that he's having right hip pain, other than that he has no new or acute concerns at this time.

## 2024-03-01 ENCOUNTER — Encounter: Payer: Self-pay | Admitting: *Deleted

## 2024-03-01 ENCOUNTER — Ambulatory Visit

## 2024-03-01 ENCOUNTER — Inpatient Hospital Stay

## 2024-03-01 VITALS — BP 137/89 | HR 81 | Temp 96.7°F | Resp 20

## 2024-03-01 DIAGNOSIS — Z87891 Personal history of nicotine dependence: Secondary | ICD-10-CM | POA: Diagnosis not present

## 2024-03-01 DIAGNOSIS — Z510 Encounter for antineoplastic radiation therapy: Secondary | ICD-10-CM | POA: Diagnosis not present

## 2024-03-01 DIAGNOSIS — C3412 Malignant neoplasm of upper lobe, left bronchus or lung: Secondary | ICD-10-CM

## 2024-03-01 DIAGNOSIS — M25551 Pain in right hip: Secondary | ICD-10-CM

## 2024-03-01 DIAGNOSIS — Z5111 Encounter for antineoplastic chemotherapy: Secondary | ICD-10-CM | POA: Diagnosis not present

## 2024-03-01 MED ORDER — SODIUM CHLORIDE 0.9 % IV SOLN
INTRAVENOUS | Status: DC
Start: 2024-03-01 — End: 2024-03-01
  Filled 2024-03-01 (×2): qty 250

## 2024-03-01 MED ORDER — ACETAMINOPHEN 325 MG PO TABS
650.0000 mg | ORAL_TABLET | Freq: Once | ORAL | Status: AC
  Administered 2024-03-01: 650 mg via ORAL
  Filled 2024-03-01: qty 2

## 2024-03-01 MED ORDER — SODIUM CHLORIDE 0.9 % IV SOLN
60.0000 mg/m2 | Freq: Once | INTRAVENOUS | Status: AC
  Administered 2024-03-01: 113 mg via INTRAVENOUS
  Filled 2024-03-01: qty 113

## 2024-03-01 MED ORDER — MAGNESIUM SULFATE 2 GM/50ML IV SOLN
2.0000 g | Freq: Once | INTRAVENOUS | Status: AC
Start: 2024-03-01 — End: 2024-03-01
  Administered 2024-03-01: 2 g via INTRAVENOUS
  Filled 2024-03-01: qty 50

## 2024-03-01 MED ORDER — SODIUM CHLORIDE 0.9 % IV SOLN
100.0000 mg/m2 | Freq: Once | INTRAVENOUS | Status: AC
  Administered 2024-03-01: 200 mg via INTRAVENOUS
  Filled 2024-03-01: qty 10

## 2024-03-01 MED ORDER — DEXAMETHASONE SODIUM PHOSPHATE 10 MG/ML IJ SOLN
10.0000 mg | Freq: Once | INTRAMUSCULAR | Status: AC
  Administered 2024-03-01: 10 mg via INTRAVENOUS
  Filled 2024-03-01: qty 1

## 2024-03-01 MED ORDER — PALONOSETRON HCL INJECTION 0.25 MG/5ML
0.2500 mg | Freq: Once | INTRAVENOUS | Status: AC
  Administered 2024-03-01: 0.25 mg via INTRAVENOUS
  Filled 2024-03-01: qty 5

## 2024-03-01 MED ORDER — POTASSIUM CHLORIDE IN NACL 20-0.9 MEQ/L-% IV SOLN
Freq: Once | INTRAVENOUS | Status: AC
  Filled 2024-03-01: qty 1000

## 2024-03-01 MED ORDER — APREPITANT 130 MG/18ML IV EMUL
130.0000 mg | Freq: Once | INTRAVENOUS | Status: AC
  Administered 2024-03-01: 130 mg via INTRAVENOUS
  Filled 2024-03-01: qty 18

## 2024-03-01 NOTE — Patient Instructions (Signed)
 [facility] - A DEPT OF [facility]  Discharge Instructions: Thank you for choosing [facility] to provide your oncology and hematology care.  If you have a lab appointment with the Cancer Center, please go directly to the Cancer Center and check in at the registration area.  Wear comfortable clothing and clothing appropriate for easy access to any Portacath or PICC line.   We strive to give you quality time with your provider. You may need to reschedule your appointment if you arrive late (15 or more minutes).  Arriving late affects you and other patients whose appointments are after yours.  Also, if you miss three or more appointments without notifying the office, you may be dismissed from the clinic at the provider's discretion.      For prescription refill requests, have your pharmacy contact our office and allow 72 hours for refills to be completed.    Today you received the following chemotherapy and/or immunotherapy agents- cisplatin , etoposide       To help prevent nausea and vomiting after your treatment, we encourage you to take your nausea medication as directed.  BELOW ARE SYMPTOMS THAT SHOULD BE REPORTED IMMEDIATELY: *FEVER GREATER THAN 100.4 F (38 C) OR HIGHER *CHILLS OR SWEATING *NAUSEA AND VOMITING THAT IS NOT CONTROLLED WITH YOUR NAUSEA MEDICATION *UNUSUAL SHORTNESS OF BREATH *UNUSUAL BRUISING OR BLEEDING *URINARY PROBLEMS (pain or burning when urinating, or frequent urination) *BOWEL PROBLEMS (unusual diarrhea, constipation, pain near the anus) TENDERNESS IN MOUTH AND THROAT WITH OR WITHOUT PRESENCE OF ULCERS (sore throat, sores in mouth, or a toothache) UNUSUAL RASH, SWELLING OR PAIN  UNUSUAL VAGINAL DISCHARGE OR ITCHING   Items with * indicate a potential emergency and should be followed up as soon as possible or go to the Emergency Department if any problems should occur.  Please show the CHEMOTHERAPY ALERT CARD or  IMMUNOTHERAPY ALERT CARD at check-in to the Emergency Department and triage nurse.  Should you have questions after your visit or need to cancel or reschedule your appointment, please contact [facility] - A DEPT OF [facility]  [phone] and follow the prompts.  Office hours are 8:00 a.m. to 4:30 p.m. Monday - Friday. Please note that voicemails left after 4:00 p.m. may not be returned until the following business day.  We are closed weekends and major holidays. You have access to a nurse at all times for urgent questions. Please call the main number to the clinic [phone] and follow the prompts.  For any non-urgent questions, you may also contact your provider using MyChart. We now offer e-Visits for anyone 11 and older to request care online for non-urgent symptoms. For details visit [email or web address].   Also download the MyChart app! Go to the app store, search "MyChart", open the app, select [place], and log in with your MyChart username and password.

## 2024-03-01 NOTE — Progress Notes (Signed)
 Met with patient during chemotherapy infusion today. Pt did not have any questions or needs at this time. Informed pt that jardiance  refill has been sent into the pharmacy for him to pick up. Also informed that order for glucometer has been sent to the pharmacy as well for him to pick up. Encouraged pt to check his blood sugar as directed while receiving chemotherapy. Will follow up with patient again tomorrow during infusion. Instructed pt to call with any questions or needs. Pt verbalized understanding.

## 2024-03-02 ENCOUNTER — Telehealth: Payer: Self-pay | Admitting: Pharmacy Technician

## 2024-03-02 ENCOUNTER — Ambulatory Visit
Admission: RE | Admit: 2024-03-02 | Discharge: 2024-03-02 | Disposition: A | Discharge status: Home / Self Care | Source: Ambulatory Visit | Attending: Internal Medicine | Admitting: Internal Medicine

## 2024-03-02 ENCOUNTER — Inpatient Hospital Stay

## 2024-03-02 ENCOUNTER — Encounter: Payer: Self-pay | Admitting: *Deleted

## 2024-03-02 ENCOUNTER — Ambulatory Visit

## 2024-03-02 VITALS — BP 139/91 | HR 72 | Temp 97.3°F | Resp 20

## 2024-03-02 DIAGNOSIS — M898X8 Other specified disorders of bone, other site: Secondary | ICD-10-CM | POA: Diagnosis not present

## 2024-03-02 DIAGNOSIS — C3412 Malignant neoplasm of upper lobe, left bronchus or lung: Secondary | ICD-10-CM | POA: Insufficient documentation

## 2024-03-02 DIAGNOSIS — C3492 Malignant neoplasm of unspecified part of left bronchus or lung: Secondary | ICD-10-CM | POA: Diagnosis not present

## 2024-03-02 DIAGNOSIS — Z87891 Personal history of nicotine dependence: Secondary | ICD-10-CM | POA: Diagnosis not present

## 2024-03-02 DIAGNOSIS — Z510 Encounter for antineoplastic radiation therapy: Secondary | ICD-10-CM | POA: Diagnosis not present

## 2024-03-02 DIAGNOSIS — Z5111 Encounter for antineoplastic chemotherapy: Secondary | ICD-10-CM | POA: Diagnosis not present

## 2024-03-02 MED ORDER — SODIUM CHLORIDE 0.9 % IV SOLN
INTRAVENOUS | Status: DC
Start: 2024-03-02 — End: 2024-03-02
  Filled 2024-03-02: qty 250

## 2024-03-02 MED ORDER — GADOBUTROL 1 MMOL/ML IV SOLN
7.5000 mL | Freq: Once | INTRAVENOUS | Status: AC | PRN
  Administered 2024-03-02: 7.5 mL via INTRAVENOUS

## 2024-03-02 MED ORDER — SODIUM CHLORIDE 0.9 % IV SOLN
100.0000 mg/m2 | Freq: Once | INTRAVENOUS | Status: AC
  Administered 2024-03-02: 200 mg via INTRAVENOUS
  Filled 2024-03-02: qty 10

## 2024-03-02 MED ORDER — DEXAMETHASONE SODIUM PHOSPHATE 10 MG/ML IJ SOLN
10.0000 mg | Freq: Once | INTRAMUSCULAR | Status: AC
  Administered 2024-03-02: 10 mg via INTRAVENOUS
  Filled 2024-03-02: qty 1

## 2024-03-02 NOTE — Patient Instructions (Signed)
 [facility] - A DEPT OF [facility]  Discharge Instructions: Thank you for choosing [facility] to provide your oncology and hematology care.  If you have a lab appointment with the Cancer Center, please go directly to the Cancer Center and check in at the registration area.  Wear comfortable clothing and clothing appropriate for easy access to any Portacath or PICC line.   We strive to give you quality time with your provider. You may need to reschedule your appointment if you arrive late (15 or more minutes).  Arriving late affects you and other patients whose appointments are after yours.  Also, if you miss three or more appointments without notifying the office, you may be dismissed from the clinic at the provider's discretion.      For prescription refill requests, have your pharmacy contact our office and allow 72 hours for refills to be completed.    Today you received the following chemotherapy and/or immunotherapy agents Etoposide     To help prevent nausea and vomiting after your treatment, we encourage you to take your nausea medication as directed.  BELOW ARE SYMPTOMS THAT SHOULD BE REPORTED IMMEDIATELY: *FEVER GREATER THAN 100.4 F (38 C) OR HIGHER *CHILLS OR SWEATING *NAUSEA AND VOMITING THAT IS NOT CONTROLLED WITH YOUR NAUSEA MEDICATION *UNUSUAL SHORTNESS OF BREATH *UNUSUAL BRUISING OR BLEEDING *URINARY PROBLEMS (pain or burning when urinating, or frequent urination) *BOWEL PROBLEMS (unusual diarrhea, constipation, pain near the anus) TENDERNESS IN MOUTH AND THROAT WITH OR WITHOUT PRESENCE OF ULCERS (sore throat, sores in mouth, or a toothache) UNUSUAL RASH, SWELLING OR PAIN  UNUSUAL VAGINAL DISCHARGE OR ITCHING   Items with * indicate a potential emergency and should be followed up as soon as possible or go to the Emergency Department if any problems should occur.  Please show the CHEMOTHERAPY ALERT CARD or IMMUNOTHERAPY  ALERT CARD at check-in to the Emergency Department and triage nurse.  Should you have questions after your visit or need to cancel or reschedule your appointment, please contact [facility] - A DEPT OF [facility]  [phone] and follow the prompts.  Office hours are 8:00 a.m. to 4:30 p.m. Monday - Friday. Please note that voicemails left after 4:00 p.m. may not be returned until the following business day.  We are closed weekends and major holidays. You have access to a nurse at all times for urgent questions. Please call the main number to the clinic [phone] and follow the prompts.  For any non-urgent questions, you may also contact your provider using MyChart. We now offer e-Visits for anyone 71 and older to request care online for non-urgent symptoms. For details visit [email or web address].   Also download the MyChart app! Go to the app store, search MyChart, open the app, select , and log in with your MyChart username and password.

## 2024-03-02 NOTE — Telephone Encounter (Signed)
 Patient approved to receive funding through the [employer].  Patient is also inquiring about finding someone who can do some light cleaning in his home.  He wants to know if this service  would be covered by his medical insurance.  I suggested that he contact his insurance carrier to find out what types of in-home services would be covered.  Patient stated that he would.  Noah Velasquez Patient Services Navigator [facility]

## 2024-03-02 NOTE — Progress Notes (Signed)
 Pt seen today after treatment. Pt states he is doing well. Helped escort pt to and from MRI today. After MRI, confirmed right AC IV has been removed and pt sent home via transportation. Nothing further needed at this time.

## 2024-03-03 ENCOUNTER — Encounter: Payer: Self-pay | Admitting: Oncology

## 2024-03-03 ENCOUNTER — Ambulatory Visit: Admitting: Internal Medicine

## 2024-03-03 ENCOUNTER — Inpatient Hospital Stay

## 2024-03-03 ENCOUNTER — Other Ambulatory Visit: Payer: Self-pay

## 2024-03-03 VITALS — BP 156/92 | HR 84 | Temp 97.4°F | Resp 18

## 2024-03-03 DIAGNOSIS — Z5111 Encounter for antineoplastic chemotherapy: Secondary | ICD-10-CM | POA: Diagnosis not present

## 2024-03-03 DIAGNOSIS — Z87891 Personal history of nicotine dependence: Secondary | ICD-10-CM | POA: Diagnosis not present

## 2024-03-03 DIAGNOSIS — C3412 Malignant neoplasm of upper lobe, left bronchus or lung: Secondary | ICD-10-CM | POA: Diagnosis not present

## 2024-03-03 DIAGNOSIS — Z510 Encounter for antineoplastic radiation therapy: Secondary | ICD-10-CM | POA: Diagnosis not present

## 2024-03-03 MED ORDER — SODIUM CHLORIDE 0.9 % IV SOLN
100.0000 mg/m2 | Freq: Once | INTRAVENOUS | Status: AC
  Administered 2024-03-03: 200 mg via INTRAVENOUS
  Filled 2024-03-03: qty 10

## 2024-03-03 MED ORDER — DEXAMETHASONE SODIUM PHOSPHATE 10 MG/ML IJ SOLN
10.0000 mg | Freq: Once | INTRAMUSCULAR | Status: AC
  Administered 2024-03-03: 10 mg via INTRAVENOUS
  Filled 2024-03-03: qty 1

## 2024-03-03 MED ORDER — SODIUM CHLORIDE 0.9 % IV SOLN
INTRAVENOUS | Status: DC
  Filled 2024-03-03: qty 250

## 2024-03-03 NOTE — Patient Instructions (Signed)

## 2024-03-06 ENCOUNTER — Encounter: Payer: Self-pay | Admitting: *Deleted

## 2024-03-06 ENCOUNTER — Ambulatory Visit
Admission: RE | Admit: 2024-03-06 | Discharge: 2024-03-06 | Disposition: A | Discharge status: Home / Self Care | Source: Ambulatory Visit | Attending: Radiation Oncology | Admitting: Radiation Oncology

## 2024-03-06 DIAGNOSIS — Z5111 Encounter for antineoplastic chemotherapy: Secondary | ICD-10-CM | POA: Diagnosis not present

## 2024-03-06 DIAGNOSIS — C3412 Malignant neoplasm of upper lobe, left bronchus or lung: Secondary | ICD-10-CM | POA: Diagnosis not present

## 2024-03-06 DIAGNOSIS — Z510 Encounter for antineoplastic radiation therapy: Secondary | ICD-10-CM | POA: Diagnosis not present

## 2024-03-06 DIAGNOSIS — Z87891 Personal history of nicotine dependence: Secondary | ICD-10-CM | POA: Diagnosis not present

## 2024-03-06 NOTE — Progress Notes (Signed)
 Met with patient during CT simulation today. Pt did not have any questions or needs at this time. States that he is doing well after chemo and feels like his breathing is improving. Instructed pt to call with any questions or needs. Reminded pt of appt for port placement next week to arrange transportation and ensure family member/friend is available to accompany him during the procedure. Pt verbalized understanding.

## 2024-03-09 ENCOUNTER — Encounter: Payer: Self-pay | Admitting: Oncology

## 2024-03-13 ENCOUNTER — Telehealth: Payer: Self-pay

## 2024-03-13 NOTE — Telephone Encounter (Signed)
Clinical Social Work attempted to contact patient by phone.  Left voicemail with contact information and request for return call.

## 2024-03-14 ENCOUNTER — Encounter: Payer: Self-pay | Admitting: *Deleted

## 2024-03-14 ENCOUNTER — Other Ambulatory Visit: Payer: Self-pay | Admitting: Radiology

## 2024-03-14 NOTE — Progress Notes (Signed)
 Per Dr. Rennie, pt will proceed with concurrent chemo/radiation for small cell lung cancer followed by maintenance immunotherapy. Per case conference discussion with Dr. Luverne on 7/10, biopsy of the left humerus is not recommended but recommends further imaging with MRI of the left humerus to further characterize. MRI of the left humerus completed and confirmed the bone lesion is compatible with metastatic disease. Dr. Lenn is aware of MRI results and will consider radiation to the left humerus bone met after completing radiation to the lung.

## 2024-03-14 NOTE — Progress Notes (Signed)
 Patient for IR Port Placement on Wed 03/15/24, I called and spoke with the patient on the phone and gave pre-procedure instructions. Pt was made aware to be here at 12p, NPO after MN prior to procedure as well as driver post procedure/recovery/discharge. Pt stated understanding.  Called 03/14/24 - Pt stated that he may be running a little late due to his ride.

## 2024-03-14 NOTE — H&P (Signed)
 Chief Complaint: L small cell lung cancer  Referring Provider(s): Rao,Archana C   Supervising Physician: Vanice Revel  Patient Status: [facility] - Out-pt  History of Present Illness: Noah Velasquez is a 58 y.o. male with PMH significant for COPD, CKD, cocaine abuse, HTN, HLD, CHF, chronic pancreatitis. He recently presented to the ER for cough and shortness of breath; CT notable for 3.7 x 1.8 cm lobulated LUL mass. He also had enlarged mediastinal nodes suspicious for metastatic disease. IR consulted for port placement for chemo.   Confirms NPO since MN and ride/supervision available for 24 hours.  Does not wear CPAP. He does intermittently use 2L O2 [place] at home.   Denies fever, chills, CP, sore throat, N/V, abd pain, blood in stool or urine, abnormal bruising, leg swelling, back pain. Endorses dry cough. Previous reported SOB has improved since quitting smoking.   Allergies Reviewed:  Hctz [hydrochlorothiazide] and Ibuprofen   Patient is Full Code  Past Medical History:  Diagnosis Date   Alcohol  use disorder    Aortic atherosclerosis (HCC)    Bronchiolo-alveolar adenocarcinoma of left lung (HCC)    CHF (congestive heart failure) (HCC)    Chronic hip pain    Chronic pancreatitis (HCC)    Cigarette smoker    CKD (chronic kidney disease) stage 3, GFR 30-59 ml/min (HCC)    COPD (chronic obstructive pulmonary disease) (HCC)    Crack cocaine use    as of 01-21-24, pt states he last used a few weeks ago   Depression    DM (diabetes mellitus), type 2 (HCC)    Dyspnea    ED (erectile dysfunction)    ESBL (extended spectrum beta-lactamase) producing bacteria infection 2021   GERD (gastroesophageal reflux disease)    Hyperlipidemia    Hypertension    ILD (interstitial lung disease) (HCC)    Marijuana use    Osteoarthritis    Pneumonia    PTSD (post-traumatic stress disorder)    Seizure (HCC)    x1 years ago   Viral infection of left eye     Past Surgical History:   Procedure Laterality Date   BRONCHOSCOPY, WITH BIOPSY USING ELECTROMAGNETIC NAVIGATION Left 01/24/2024   Procedure: BRONCHOSCOPY, WITH BIOPSY USING ELECTROMAGNETIC NAVIGATION;  Surgeon: Isaiah Scrivener, MD;  Location: [facility] ORS;  Service: Pulmonary;  Laterality: Left;   ENDOBRONCHIAL ULTRASOUND Right 01/24/2024   Procedure: ENDOBRONCHIAL ULTRASOUND (EBUS);  Surgeon: Kasa, Kurian, MD;  Location: [facility] ORS;  Service: Pulmonary;  Laterality: Right;   pancreatic stent placement        Medications: Prior to Admission medications   Medication Sig Start Date End Date Taking? Authorizing Provider  Accu-Chek Softclix Lancets lancets Use with blood glucose monitor in the morning, at noon, and at bedtime. May substitute to any manufacturer covered by patient's insurance. 02/29/24   Brahmanday, Govinda R, MD  amLODipine -olmesartan  (AZOR ) 10-40 MG tablet Take 1 tablet by mouth daily. 01/25/24   Antonette Angeline ORN, NP  Blood Glucose Monitoring Suppl (BLOOD GLUCOSE MONITOR SYSTEM) w/Device KIT Check blood sugar in the morning, at noon, and at bedtime. May substitute to any manufacturer covered by patient's insurance. 02/29/24   Brahmanday, Govinda R, MD  dexamethasone  (DECADRON ) 4 MG tablet Take 2 tablets (8 mg total) by mouth daily. Take for 1 day starting the day after chemotherapy on day 4. Take with food. 02/21/24   [name], MD  empagliflozin  (JARDIANCE ) 10 MG TABS tablet Take 1 tablet (10 mg total) by mouth daily before breakfast. 02/24/24  [place] A, FNP  ipratropium-albuterol  (DUONEB) 0.5-2.5 (3) MG/3ML SOLN Take 3 mLs by nebulization every 2 (two) hours as needed (wheeze, SOB). Can use instead of albuterol  inhaler 02/23/24   Antonette Angeline ORN, NP  lidocaine -prilocaine  (EMLA ) cream Apply to affected area once 02/21/24   [name], MD  lovastatin  (MEVACOR ) 10 MG tablet Take 1 tablet (10 mg total) by mouth at bedtime. 01/25/24   Antonette Angeline ORN, NP  metFORMIN  (GLUCOPHAGE ) 500 MG tablet Take 1 tablet (500 mg  total) by mouth daily with breakfast. 01/25/24   Antonette Angeline ORN, NP  naproxen  (NAPROSYN ) 250 MG tablet Take 250 mg by mouth 2 (two) times daily with a meal.    [provider]  Nebulizers (COMPRESSOR/NEBULIZER) MISC Nebulizer and tubes/mask to use as directed with DuoNeb as needed 06/03/22   Alexander, Natalie, DO  nicotine  (NICODERM CQ  - DOSED IN MG/24 HOURS) 21 mg/24hr patch Place 1 patch (21 mg total) onto the skin daily. 06/04/22   Alexander, Natalie, DO  omeprazole  (PRILOSEC) 20 MG capsule Take 1 capsule (20 mg total) by mouth daily. 01/25/24   Antonette Angeline ORN, NP  ondansetron  (ZOFRAN ) 8 MG tablet Take 1 tablet (8 mg total) by mouth every 8 (eight) hours as needed for nausea or vomiting. Start on the third day after cisplatin . 02/21/24   [name], MD  OXYGEN  Inhale 2 L into the lungs as needed.    [provider]  Pancrelipase , Lip-Prot-Amyl, (ZENPEP ) 15000-47000 units CPEP Take 1 capsule (15,000 Units total) by mouth 3 (three) times daily. 12/07/23   Antonette Angeline ORN, NP  prochlorperazine  (COMPAZINE ) 10 MG tablet Take 1 tablet (10 mg total) by mouth every 6 (six) hours as needed for nausea or vomiting. 02/21/24   [name], MD  sertraline  (ZOLOFT ) 100 MG tablet Take 1 tablet (100 mg total) by mouth daily. 02/16/24   Antonette Angeline ORN, NP  spironolactone  (ALDACTONE ) 25 MG tablet Take 1 tablet (25 mg total) by mouth daily. 02/24/24 05/24/24  [place]ette [place] LABOR, FNP  traZODone  (DESYREL ) 150 MG tablet Take 1 tablet (150 mg total) by mouth at bedtime as needed for sleep. 01/25/24   Antonette Angeline ORN, NP  VENTOLIN  HFA 108 (90 Base) MCG/ACT inhaler Inhale 1-2 puffs into the lungs every 6 (six) hours as needed for wheezing or shortness of breath (or coughing). 02/15/24   Isaiah Scrivener, MD     Family History  Problem Relation Age of Onset   Diabetes Mother    Sleep apnea Mother    Emphysema Sister    CAD Neg Hx    Mental illness Neg Hx     Social History   Socioeconomic History    Marital status: Legally Separated    Spouse name: Not on file   Number of children: 2   Years of education: Not on file   Highest education level: High school graduate  Occupational History   Occupation: on disability  Tobacco Use   Smoking status: Former    Current packs/day: 3.00    Average packs/day: 3.0 packs/day for 15.0 years (45.0 ttl pk-yrs)    Types: Cigarettes   Smokeless tobacco: Never   Tobacco comments:    At his heaviest smoked 3ppd and now occasionally smoke 1 cigarette. 02/01/24  Vaping Use   Vaping status: Some Days  Substance and Sexual Activity   Alcohol  use: Yes    Alcohol /week: 7.0 standard drinks of alcohol     Types: 7 Cans of beer per  week    Comment: occ   Drug use: Yes    Types: Marijuana, Crack cocaine    Comment: Marijuana every day. 7-64yrs clean of crack cocaine.   Sexual activity: Yes    Birth control/protection: Condom  Other Topics Concern   Not on file  Social History Narrative   Not on file   Social Drivers of Health   Financial Resource Strain: Not on file  Food Insecurity: No Food Insecurity (01/31/2024)   Hunger Vital Sign    Worried About Running Out of Food in the Last Year: Never true    Ran Out of Food in the Last Year: Never true  Recent Concern: Food Insecurity - Food Insecurity Present (01/15/2024)   Hunger Vital Sign    Worried About Running Out of Food in the Last Year: Sometimes true    Ran Out of Food in the Last Year: Sometimes true  Transportation Needs: No Transportation Needs (01/31/2024)   PRAPARE - Administrator, Civil Service (Medical): No    Lack of Transportation (Non-Medical): No  Physical Activity: Not on file  Stress: Not on file  Social Connections: Not on file     Review of Systems: A 12 point ROS discussed and pertinent positives are indicated in the HPI above.  All other systems are negative.   Vital Signs: BP 130/89   Pulse 75   Temp 98.3 F (36.8 C) (Oral)   Resp 19   Ht 5' 7.5  (1.715 m)   Wt 164 lb (74.4 kg)   SpO2 94%   BMI 25.31 kg/m     Physical Exam HENT:     Mouth/Throat:     Mouth: Mucous membranes are moist.     Pharynx: Oropharynx is clear.  Cardiovascular:     Rate and Rhythm: Normal rate and regular rhythm.     Pulses: Normal pulses.     Heart sounds: Normal heart sounds.  Pulmonary:     Effort: Pulmonary effort is normal.     Breath sounds: Normal breath sounds.  Abdominal:     Palpations: Abdomen is soft.  Musculoskeletal:     Right lower leg: No edema.     Left lower leg: No edema.  Skin:    General: Skin is warm and dry.     Comments: No rash or wounds over planned puncture site  Neurological:     General: No focal deficit present.     Mental Status: He is alert and oriented to person, place, and time.  Psychiatric:        Mood and Affect: Mood normal.        Behavior: Behavior normal.        Thought Content: Thought content normal.        Judgment: Judgment normal.     Imaging: MR HUMERUS LEFT W WO CONTRAST Result Date: 03/02/2024 CLINICAL DATA:  Metastatic small cell lung cancer, suspicion for left humeral involvement based on PET scan EXAM: MRI OF THE LEFT HUMERUS WITHOUT AND WITH CONTRAST TECHNIQUE: Multiplanar, multisequence MR imaging of the left humerus was performed before and after the administration of intravenous contrast. CONTRAST:  7.5mL GADAVIST  GADOBUTROL  1 MMOL/ML IV SOLN COMPARISON:  PET-CT 02/16/2024 FINDINGS: Bones/Joint/Cartilage MRI confirms a 6.5 by 1.6 by 1.5 cm primarily intramedullary enhancing lesion compatible with metastatic disease in the diaphysis of the mid to proximal left humerus. This has high T2 low T1 signal characteristics and there is some mild medial periostitis along the humerus  although no obvious or well-defined cortical breakthrough or extraosseous extension of tumor. The lesion enhances with some central hypoenhancement and corresponds to the hypermetabolic lesion at PET-CT. Small degenerative  lesions are identified along the humeral head. Ligaments N/A Muscles and Tendons Unremarkable Soft tissues A left upper lobe mass is observed on image 36 series 14, previously hypermetabolic on PET-CT. This abuts the pleural margin without well-defined chest wall invasion. IMPRESSION: 1. MRI confirms a 6.5 by 1.6 by 1.5 cm primarily intramedullary enhancing lesion in the diaphysis of the mid to proximal left humerus, compatible with metastatic disease. There is some mild medial periostitis along the humerus although no obvious or well-defined cortical breakthrough or extraosseous extension of tumor. 2. Left upper lobe mass, previously hypermetabolic on PET-CT. 3. Small degenerative lesions along the humeral head. Electronically Signed   By: Ryan Salvage M.D.   On: 03/02/2024 16:01   NM PET Image Initial (PI) Skull Base To Thigh Result Date: 02/17/2024 CLINICAL DATA:  Initial treatment strategy for lung nodule. EXAM: NUCLEAR MEDICINE PET SKULL BASE TO THIGH TECHNIQUE: 9.68 mCi F-18 FDG was injected intravenously. Full-ring PET imaging was performed from the skull base to thigh after the radiotracer. CT data was obtained and used for attenuation correction and anatomic localization. Fasting blood glucose: 125 mg/dl COMPARISON:  Chest CT without contrast 01/16/2024. Abdominal imaging 2023 and older FINDINGS: Mediastinal blood pool activity: SUV max 2.0 Liver activity: SUV max 2.7 NECK: There is some physiologic uptake along the strap muscles in the lung neck bilaterally. No specific abnormal uptake identified above blood pool in the neck otherwise including along lymph node change of the submandibular, posterior triangle or internal jugular region. Incidental CT findings: The parotid glands, submandibular glands and thyroid  glands unremarkable. Visualized portions of the paranasal sinuses and mastoid air cells are grossly clear. Scattered vascular calcifications are seen. CHEST: On the prior CT scan was a  multilobular peripheral left upper lobe mass abutting the pleura. This is again seen today and is similar in appearance by CT, such as series 6, image 45. This area has uptake of maximum SUV value of 15.7 consistent with neoplasm. There are some additional small adjacent nodules in the left upper lobe including just posteromedial which also show abnormal uptake and could be satellite lesions. Example with maximum SUV of 3.4 is seen on CT image 45 measuring 8 mm. Another example on image 49 today more anteromedial adjacent to the hilum measures 9 mm and has uptake of maximum SUV of 7.4. Few other foci as well left upper lobe. No other areas of abnormal lung uptake. There are several left-sided upper mediastinal hypermetabolic nodes. These include AP window measuring maximum SUV of 13.8 and dimension on image 49 today at 3.0 by 2.3 cm. Focus to the left of the ascending aorta has dimension on image 44 measuring 3.6 by 1.7 cm. Maximum SUV of 15.9. There are some small nodes more superior to this at the level of the great vessels which has some low-level uptake and are suspicious. Additional suspicious nodes right paratracheal with maximum SUV value of 4.4 and dimension on image 43 a short axis of 14 mm. Suspicious subcarinal nodes as well. No significant abnormal uptake in the axilla. Incidental CT findings: Heart is nonenlarged. Prominent vascular calcifications including along the coronary arteries. Please correlate for other coronary risk factors. Bovine type aortic arch, normal variant. Patulous esophagus with a small hiatal hernia. Emphysematous lung changes identified greatest in the upper lung zones. There is some  dependent atelectasis. Breathing motion. No pleural effusion. ABDOMEN/PELVIS: There is physiologic distribution radiotracer along the parenchymal organs, bowel and renal collecting systems. Low-level uptake along right inguinal nodes which have maximum SUV value of 3.7 for example. Node in this  location has short axis of 11 mm on series 6, image 133. Incidental CT findings: On this limited noncontrast CT scan for attenuation correction, grossly the liver, spleen, adrenal glands are unremarkable. Dystrophic calcifications along the atrophic pancreas with pancreatic dilatation. Please correlate for any history of chronic calcific pancreatitis or other process. This was described previously. Right-sided greater than left renal atrophy. No renal or ureteral stones clearly identified. Preserved contour to the urinary bladder. Underdistended stomach. Small bowel is nondilated. Normal retrocecal appendix. Large bowel has a normal course and caliber with scattered stool. Breathing motion. Diffuse vascular calcifications identified along the aorta and branch vessels. Most extensive along the common iliac vessels, right peer than left. SKELETON: There is a area of uptake along the proximal left humeral shaft which has maximum SUV value of 9.3. Slightly heterogeneous appearance of the marrow on CT in this location. No other areas of abnormal bony uptake identified along the visualized skeleton. This could be an isolated bony metastasis. Incidental CT findings: Diffuse degenerative changes identified. Particularly advanced are changes along the right hip with subchondral cyst formation, severe joint space loss and osteophytes. IMPRESSION: Left upper lobe peripheral lung mass again identified and has significant abnormal uptake consistent with neoplasm. There are several satellite lesions which show some uptake in the left upper lobe as well. Several significant hypermetabolic nodes along the left side of the mediastinum including prevascular, AP window. Consistent with regional nodal metastasis. In addition there are several other enlarged but smaller nodes in the mediastinum including right paratracheal and subcarinal which have been slight increased uptake but much less than the aforementioned nodes in the primary  lung lesion. Although these also could represent early areas of spread of disease these were also have a differential. There is a similar appearing node in the right inguinal region as well. Attention on follow-up versus additional workup. Isolated bony area of abnormal uptake along the proximal humeral shaft with slight heterogeneity on CT in this location. An isolated bony metastasis is possible. Please correlate for any known history. Dedicated humerus x-rays may be of some benefit. Electronically Signed   By: Ranell Bring M.D.   On: 02/17/2024 15:27    Labs:  CBC: Recent Labs    12/07/23 1334 01/15/24 0013 01/16/24 0702 02/29/24 0851  WBC 8.0 7.9 10.7* 8.0  HGB 16.0 14.2 14.4 14.0  HCT 47.9 41.4 41.2 42.0  PLT 304 271 267 247    COAGS: No results for input(s): INR, APTT in the last 8760 hours.  BMP: Recent Labs    01/16/24 0702 01/17/24 1043 02/24/24 1555 02/29/24 0851  NA 130* 133* 134* 130*  K 3.8 3.5 3.8 4.1  CL 99 97* 99 101  CO2 21* 25 24 22   GLUCOSE 176* 172* 122* 190*  BUN 20 20 14 16   CALCIUM  8.7* 8.6* 9.3 8.5*  CREATININE 0.94 1.05 0.66 1.00  GFRNONAA >60 >60 >60 >60    LIVER FUNCTION TESTS: Recent Labs    08/31/23 1318 12/07/23 1334 01/15/24 0211 02/29/24 0851  BILITOT 0.5 0.7 0.4 0.5  AST 18 24 25 30   ALT 10 13 15 13   ALKPHOS  --   --  62 76  PROT 8.9* 9.2* 8.7* 8.5*  ALBUMIN  --   --  3.1* 3.4*    TUMOR MARKERS: No results for input(s): AFPTM, CEA, CA199, CHROMGRNA in the last 8760 hours.  Assessment and Plan:  Request for  image guided port insertion approved for 7/30 with Dr. Vanice No contraindications for procedure identified in ROS, physical exam, or review of pre-sedation considerations. 7/15 Labs reviewed and within acceptable range 02/16/24 imaging available and reviewed  VSS, afebrile Patient not asked to hold any AC/AP for this low bleeding risk procedure Abx not indicated    Risks and benefits of image guided  port-a-catheter placement was discussed with the patient including, but not limited to bleeding, infection, pneumothorax, or fibrin sheath development and need for additional procedures.  All of the patient's questions were answered, patient is agreeable to proceed. Consent signed and in chart.   Thank you for allowing our service to participate in Noah Velasquez 's care.    Electronically Signed: Laymon Coast, NP   03/15/2024, 12:53 PM     I spent a total of  15 Minutes   in face to face in clinical consultation, greater than 50% of which was counseling/coordinating care for image guided port insertion.   (A copy of this note was sent to the referring provider and the time of visit.)

## 2024-03-15 ENCOUNTER — Other Ambulatory Visit: Payer: Self-pay

## 2024-03-15 ENCOUNTER — Other Ambulatory Visit: Payer: Self-pay | Admitting: Internal Medicine

## 2024-03-15 ENCOUNTER — Encounter: Payer: Self-pay | Admitting: Radiology

## 2024-03-15 ENCOUNTER — Ambulatory Visit
Admission: RE | Admit: 2024-03-15 | Discharge: 2024-03-15 | Disposition: A | Discharge status: Home / Self Care | Source: Ambulatory Visit | Attending: Oncology | Admitting: Oncology

## 2024-03-15 DIAGNOSIS — N183 Chronic kidney disease, stage 3 unspecified: Secondary | ICD-10-CM | POA: Insufficient documentation

## 2024-03-15 DIAGNOSIS — Z9981 Dependence on supplemental oxygen: Secondary | ICD-10-CM | POA: Insufficient documentation

## 2024-03-15 DIAGNOSIS — E785 Hyperlipidemia, unspecified: Secondary | ICD-10-CM | POA: Insufficient documentation

## 2024-03-15 DIAGNOSIS — C3412 Malignant neoplasm of upper lobe, left bronchus or lung: Secondary | ICD-10-CM | POA: Insufficient documentation

## 2024-03-15 DIAGNOSIS — I130 Hypertensive heart and chronic kidney disease with heart failure and stage 1 through stage 4 chronic kidney disease, or unspecified chronic kidney disease: Secondary | ICD-10-CM | POA: Insufficient documentation

## 2024-03-15 DIAGNOSIS — E1122 Type 2 diabetes mellitus with diabetic chronic kidney disease: Secondary | ICD-10-CM | POA: Diagnosis not present

## 2024-03-15 DIAGNOSIS — Z7984 Long term (current) use of oral hypoglycemic drugs: Secondary | ICD-10-CM | POA: Diagnosis not present

## 2024-03-15 DIAGNOSIS — I509 Heart failure, unspecified: Secondary | ICD-10-CM | POA: Insufficient documentation

## 2024-03-15 DIAGNOSIS — Z87891 Personal history of nicotine dependence: Secondary | ICD-10-CM | POA: Insufficient documentation

## 2024-03-15 DIAGNOSIS — J449 Chronic obstructive pulmonary disease, unspecified: Secondary | ICD-10-CM | POA: Insufficient documentation

## 2024-03-15 DIAGNOSIS — C3490 Malignant neoplasm of unspecified part of unspecified bronchus or lung: Secondary | ICD-10-CM | POA: Diagnosis not present

## 2024-03-15 HISTORY — PX: IR IMAGING GUIDED PORT INSERTION: IMG5740

## 2024-03-15 LAB — GLUCOSE, CAPILLARY
Glucose-Capillary: 113 mg/dL — ABNORMAL HIGH (ref 70–99)
Glucose-Capillary: 100 mg/dL — ABNORMAL HIGH (ref 70–99)

## 2024-03-15 MED ORDER — FENTANYL CITRATE (PF) 100 MCG/2ML IJ SOLN
INTRAMUSCULAR | Status: AC
Start: 2024-03-15 — End: 2024-03-15
  Filled 2024-03-15: qty 2

## 2024-03-15 MED ORDER — HEPARIN SOD (PORK) LOCK FLUSH 100 UNIT/ML IV SOLN
500.0000 [IU] | Freq: Once | INTRAVENOUS | Status: AC
  Administered 2024-03-15: 500 [IU] via INTRAVENOUS

## 2024-03-15 MED ORDER — HEPARIN SOD (PORK) LOCK FLUSH 100 UNIT/ML IV SOLN
INTRAVENOUS | Status: AC
  Filled 2024-03-15: qty 5

## 2024-03-15 MED ORDER — FENTANYL CITRATE (PF) 100 MCG/2ML IJ SOLN
INTRAMUSCULAR | Status: AC | PRN
  Administered 2024-03-15 (×2): 50 ug via INTRAVENOUS

## 2024-03-15 MED ORDER — SODIUM CHLORIDE 0.9 % IV SOLN: INTRAVENOUS | Status: DC

## 2024-03-15 MED ORDER — LIDOCAINE-EPINEPHRINE 1 %-1:100000 IJ SOLN
20.0000 mL | Freq: Once | INTRAMUSCULAR | Status: AC
  Administered 2024-03-15: 20 mL via INTRADERMAL

## 2024-03-15 MED ORDER — MIDAZOLAM HCL 2 MG/2ML IJ SOLN
INTRAMUSCULAR | Status: AC
Start: 2024-03-15 — End: 2024-03-15
  Filled 2024-03-15: qty 2

## 2024-03-15 MED ORDER — LIDOCAINE-EPINEPHRINE 1 %-1:100000 IJ SOLN
INTRAMUSCULAR | Status: AC
Start: 2024-03-15 — End: 2024-03-15
  Filled 2024-03-15: qty 1

## 2024-03-15 MED ORDER — MIDAZOLAM HCL 2 MG/2ML IJ SOLN
INTRAMUSCULAR | Status: AC | PRN
  Administered 2024-03-15 (×2): 1 mg via INTRAVENOUS

## 2024-03-15 NOTE — Progress Notes (Signed)
 Patient clinically stable post IR Port placement per Dr Vanice, tolerated well. Vitals stable pre and post procedure. Received Versed  2 mg along with Fentanyl  100 mcg IV for procedure. Report given to Jequetta Thomas RN post procedure/specials/21

## 2024-03-15 NOTE — Procedures (Signed)
Interventional Radiology Procedure Note  Procedure: RT internal jugular POWER PORT    Complications: None  Estimated Blood Loss:  MIN  Findings: TIP SVCRA    M. TREVOR Levaeh Vice, MD    

## 2024-03-16 DIAGNOSIS — Z5111 Encounter for antineoplastic chemotherapy: Secondary | ICD-10-CM | POA: Diagnosis not present

## 2024-03-16 DIAGNOSIS — Z510 Encounter for antineoplastic radiation therapy: Secondary | ICD-10-CM | POA: Diagnosis not present

## 2024-03-16 DIAGNOSIS — Z87891 Personal history of nicotine dependence: Secondary | ICD-10-CM | POA: Diagnosis not present

## 2024-03-16 DIAGNOSIS — C3412 Malignant neoplasm of upper lobe, left bronchus or lung: Secondary | ICD-10-CM | POA: Diagnosis not present

## 2024-03-16 NOTE — Telephone Encounter (Signed)
 Requested Prescriptions  Refused Prescriptions Disp Refills   amLODipine -olmesartan  (AZOR ) 10-40 MG tablet [Pharmacy Med Name: AMLODIPINE -OLMESARTAN  10-40 MG TAB] 90 tablet 1    Sig: TAKE 1 TABLET BY MOUTH ONCE DAILY     Cardiovascular: CCB + ARB Combos Failed - 03/16/2024  1:47 PM      Failed - Na in normal range and within 180 days    Sodium  Date Value Ref Range Status  02/29/2024 130 (L) 135 - 145 mmol/L Final         Passed - K in normal range and within 180 days    Potassium  Date Value Ref Range Status  02/29/2024 4.1 3.5 - 5.1 mmol/L Final         Passed - Cr in normal range and within 180 days    Creatinine  Date Value Ref Range Status  02/29/2024 1.00 0.61 - 1.24 mg/dL Final   Creat  Date Value Ref Range Status  12/07/2023 1.03 0.70 - 1.30 mg/dL Final   Creatinine, Urine  Date Value Ref Range Status  12/07/2023 203 20 - 320 mg/dL Final         Passed - Patient is not pregnant      Passed - Last BP in normal range    BP Readings from Last 1 Encounters:  03/15/24 111/79         Passed - Valid encounter within last 6 months    Recent Outpatient Visits           3 months ago Type 2 diabetes mellitus with diabetic mononeuropathy, without long-term current use of insulin  (HCC)   [place] [facility] [place], [place], NP               metFORMIN  (GLUCOPHAGE ) 500 MG tablet [Pharmacy Med Name: METFORMIN  HCL 500 MG TAB] 90 tablet 1    Sig: TAKE 1 TABLET BY MOUTH ONCE DAILY WITH BREAKFAST     Endocrinology:  Diabetes - Biguanides Failed - 03/16/2024  1:47 PM      Failed - B12 Level in normal range and within 720 days    No results found for: VITAMINB12       Passed - Cr in normal range and within 360 days    Creatinine  Date Value Ref Range Status  02/29/2024 1.00 0.61 - 1.24 mg/dL Final   Creat  Date Value Ref Range Status  12/07/2023 1.03 0.70 - 1.30 mg/dL Final   Creatinine, Urine  Date Value Ref Range Status  12/07/2023 203  20 - 320 mg/dL Final         Passed - HBA1C is between 0 and 7.9 and within 180 days    Hgb A1c MFr Bld  Date Value Ref Range Status  12/07/2023 6.5 (H) <5.7 % Final    Comment:    For someone without known diabetes, a hemoglobin A1c value of 6.5% or greater indicates that they may have  diabetes and this should be confirmed with a follow-up  test. . For someone with known diabetes, a value <7% indicates  that their diabetes is well controlled and a value  greater than or equal to 7% indicates suboptimal  control. A1c targets should be individualized based on  duration of diabetes, age, comorbid conditions, and  other considerations. . Currently, no consensus exists regarding use of hemoglobin A1c for diagnosis of diabetes for children. .          Passed - eGFR in normal range  and within 360 days    GFR, Est African American  Date Value Ref Range Status  06/06/2020 74 > OR = 60 mL/min/1.103m2 Final   GFR, Est Non African American  Date Value Ref Range Status  06/06/2020 64 > OR = 60 mL/min/1.26m2 Final   GFR, Estimated  Date Value Ref Range Status  02/29/2024 >60 >60 mL/min Final    Comment:    (NOTE) Calculated using the CKD-EPI Creatinine Equation (2021)    eGFR  Date Value Ref Range Status  12/07/2023 84 > OR = 60 mL/min/1.52m2 Final         Passed - Valid encounter within last 6 months    Recent Outpatient Visits           3 months ago Type 2 diabetes mellitus with diabetic mononeuropathy, without long-term current use of insulin  [facility])   [place] [facility] [place], [place] W, NP              Passed - CBC within normal limits and completed in the last 12 months    WBC  Date Value Ref Range Status  01/16/2024 10.7 (H) 4.0 - 10.5 K/uL Final   WBC Count  Date Value Ref Range Status  02/29/2024 8.0 4.0 - 10.5 K/uL Final   RBC  Date Value Ref Range Status  02/29/2024 4.49 4.22 - 5.81 MIL/uL Final   Hemoglobin  Date Value Ref  Range Status  02/29/2024 14.0 13.0 - 17.0 g/dL Final   HCT  Date Value Ref Range Status  02/29/2024 42.0 39.0 - 52.0 % Final   MCHC  Date Value Ref Range Status  02/29/2024 33.3 30.0 - 36.0 g/dL Final   [facility]  Date Value Ref Range Status  02/29/2024 31.2 26.0 - 34.0 pg Final   MCV  Date Value Ref Range Status  02/29/2024 93.5 80.0 - 100.0 fL Final   No results found for: PLTCOUNTKUC, LABPLAT, POCPLA RDW  Date Value Ref Range Status  02/29/2024 12.7 11.5 - 15.5 % Final

## 2024-03-19 DIAGNOSIS — R0602 Shortness of breath: Secondary | ICD-10-CM | POA: Diagnosis not present

## 2024-03-19 DIAGNOSIS — J449 Chronic obstructive pulmonary disease, unspecified: Secondary | ICD-10-CM | POA: Diagnosis not present

## 2024-03-20 ENCOUNTER — Ambulatory Visit
Admission: RE | Admit: 2024-03-20 | Discharge: 2024-03-20 | Disposition: A | Discharge status: Home / Self Care | Source: Ambulatory Visit | Attending: Radiation Oncology | Admitting: Radiation Oncology

## 2024-03-20 DIAGNOSIS — Z87891 Personal history of nicotine dependence: Secondary | ICD-10-CM | POA: Insufficient documentation

## 2024-03-20 DIAGNOSIS — C3412 Malignant neoplasm of upper lobe, left bronchus or lung: Secondary | ICD-10-CM | POA: Insufficient documentation

## 2024-03-20 DIAGNOSIS — Z5111 Encounter for antineoplastic chemotherapy: Secondary | ICD-10-CM | POA: Insufficient documentation

## 2024-03-20 DIAGNOSIS — Z510 Encounter for antineoplastic radiation therapy: Secondary | ICD-10-CM | POA: Insufficient documentation

## 2024-03-21 ENCOUNTER — Inpatient Hospital Stay

## 2024-03-21 ENCOUNTER — Encounter: Payer: Self-pay | Admitting: Oncology

## 2024-03-21 ENCOUNTER — Inpatient Hospital Stay: Admitting: Oncology

## 2024-03-21 ENCOUNTER — Other Ambulatory Visit: Payer: Self-pay

## 2024-03-21 ENCOUNTER — Ambulatory Visit
Admission: RE | Admit: 2024-03-21 | Discharge: 2024-03-21 | Disposition: A | Discharge status: Home / Self Care | Source: Ambulatory Visit | Attending: Radiation Oncology | Admitting: Radiation Oncology

## 2024-03-21 VITALS — BP 150/86 | HR 73 | Temp 98.8°F | Resp 16 | Wt 166.0 lb

## 2024-03-21 DIAGNOSIS — Z5111 Encounter for antineoplastic chemotherapy: Secondary | ICD-10-CM | POA: Insufficient documentation

## 2024-03-21 DIAGNOSIS — Z510 Encounter for antineoplastic radiation therapy: Secondary | ICD-10-CM | POA: Diagnosis not present

## 2024-03-21 DIAGNOSIS — I509 Heart failure, unspecified: Secondary | ICD-10-CM | POA: Insufficient documentation

## 2024-03-21 DIAGNOSIS — E871 Hypo-osmolality and hyponatremia: Secondary | ICD-10-CM | POA: Insufficient documentation

## 2024-03-21 DIAGNOSIS — T451X5A Adverse effect of antineoplastic and immunosuppressive drugs, initial encounter: Secondary | ICD-10-CM | POA: Diagnosis not present

## 2024-03-21 DIAGNOSIS — D701 Agranulocytosis secondary to cancer chemotherapy: Secondary | ICD-10-CM | POA: Diagnosis not present

## 2024-03-21 DIAGNOSIS — C3412 Malignant neoplasm of upper lobe, left bronchus or lung: Secondary | ICD-10-CM | POA: Diagnosis not present

## 2024-03-21 DIAGNOSIS — D61818 Other pancytopenia: Secondary | ICD-10-CM | POA: Insufficient documentation

## 2024-03-21 DIAGNOSIS — Z87891 Personal history of nicotine dependence: Secondary | ICD-10-CM | POA: Diagnosis not present

## 2024-03-21 LAB — RAD ONC ARIA SESSION SUMMARY
Course Elapsed Days: 0
Plan Fractions Treated to Date: 1
Plan Prescribed Dose Per Fraction: 2 Gy
Plan Total Fractions Prescribed: 30
Plan Total Prescribed Dose: 60 Gy
Reference Point Dosage Given to Date: 2 Gy
Reference Point Session Dosage Given: 2 Gy
Session Number: 1

## 2024-03-21 LAB — CBC WITH DIFFERENTIAL (CANCER CENTER ONLY)
Abs Immature Granulocytes: 0.03 K/uL (ref 0.00–0.07)
Basophils Absolute: 0 K/uL (ref 0.0–0.1)
Basophils Relative: 1 %
Eosinophils Absolute: 0.1 K/uL (ref 0.0–0.5)
Eosinophils Relative: 4 %
HCT: 36.2 % — ABNORMAL LOW (ref 39.0–52.0)
Hemoglobin: 12.3 g/dL — ABNORMAL LOW (ref 13.0–17.0)
Immature Granulocytes: 1 %
Lymphocytes Relative: 70 %
Lymphs Abs: 2 K/uL (ref 0.7–4.0)
MCH: 31.1 pg (ref 26.0–34.0)
MCHC: 34 g/dL (ref 30.0–36.0)
MCV: 91.4 fL (ref 80.0–100.0)
Monocytes Absolute: 0.4 K/uL (ref 0.1–1.0)
Monocytes Relative: 15 %
Neutro Abs: 0.3 K/uL — CL (ref 1.7–7.7)
Neutrophils Relative %: 9 %
Platelet Count: 253 K/uL (ref 150–400)
RBC: 3.96 MIL/uL — ABNORMAL LOW (ref 4.22–5.81)
RDW: 12.6 % (ref 11.5–15.5)
WBC Count: 2.8 K/uL — ABNORMAL LOW (ref 4.0–10.5)
nRBC: 0 % (ref 0.0–0.2)

## 2024-03-21 LAB — CMP (CANCER CENTER ONLY)
ALT: 12 U/L (ref 0–44)
AST: 23 U/L (ref 15–41)
Albumin: 3.6 g/dL (ref 3.5–5.0)
Alkaline Phosphatase: 89 U/L (ref 38–126)
Anion gap: 9 (ref 5–15)
BUN: 11 mg/dL (ref 6–20)
CO2: 22 mmol/L (ref 22–32)
Calcium: 8.7 mg/dL — ABNORMAL LOW (ref 8.9–10.3)
Chloride: 103 mmol/L (ref 98–111)
Creatinine: 1.05 mg/dL (ref 0.61–1.24)
GFR, Estimated: >60 mL/min (ref >60)
Glucose, Bld: 117 mg/dL — ABNORMAL HIGH (ref 70–99)
Potassium: 3.9 mmol/L (ref 3.5–5.1)
Sodium: 134 mmol/L — ABNORMAL LOW (ref 135–145)
Total Bilirubin: 0.4 mg/dL (ref 0.0–1.2)
Total Protein: 7.8 g/dL (ref 6.5–8.1)

## 2024-03-21 LAB — MAGNESIUM: Magnesium: 1.8 mg/dL (ref 1.7–2.4)

## 2024-03-21 MED ORDER — FILGRASTIM-SNDZ 480 MCG/0.8ML IJ SOSY
480.0000 ug | PREFILLED_SYRINGE | Freq: Every day | INTRAMUSCULAR | Status: DC
  Administered 2024-03-21: 480 ug via SUBCUTANEOUS
  Filled 2024-03-21: qty 0.8

## 2024-03-21 NOTE — Progress Notes (Signed)
 Critcal alert per chase in lab ANC 0.3 (read back completed).

## 2024-03-21 NOTE — Progress Notes (Signed)
 No chemotherapy today due to low ANC. Zarxio  given per MD order. Patient to return tomorrow and Thursday for additional Zarxio . Port deaccessed. AVS given to patient. Patient verbalized understand.

## 2024-03-21 NOTE — Progress Notes (Signed)
 Hematology/Oncology Consult note [facility]  Telephone:([phone] [phone]  Patient Care Team: Antonette Angeline ORN, NP as PCP - General (Internal Medicine) Verdene Gills, RN as Oncology Nurse Navigator [name], MD as Consulting Physician (Oncology) Rennie Cindy SAUNDERS, MD as Consulting Physician (Oncology)   Name of the patient: Noah Velasquez  [id number]  [birth year]   Date of visit: 03/21/24  Diagnosis-  Cancer Staging  Small cell lung cancer, left upper lobe [facility]) Staging form: Lung, AJCC V9 - Clinical stage from 02/21/2024: Stage IIIB (cT2, cN2b, cM0) - Signed by [name], MD on 02/21/2024 Method of lymph node assessment: Clinical    Chief complaint/ Reason for visit-on treatment assessment prior to cycle 2 of cisplatin  etoposide  chemotherapy  Heme/Onc history: Patient is a 58 year old African-American male with a past medical history significant for CHF, interstitial lung disease, type 2 diabetes.  He is mainly wheelchair-bound but lives independently.  He underwent CT chest without contrast when he had a chest x-ray which showed a lung nodule.  CT chest showed a 3.7 x 1.8 cm lobulated mass in the left upper lobe abutting the chest wall concerning for primary bronchogenic carcinoma.  Adjacent satellite nodularity in the left upper lobe.  14 mm right paratracheal node and 14 mm short axis prevascular node.  Moderate centrilobular and paraseptal emphysematous changes.  MRI brain with and without contrast showed remote infarcts and chronic microvascular disease but no evidence of acute intracranial abnormality.  Patient was seen by Dr. Isaiah and underwent bronchoscopy.  Left upper lobe lung biopsy was consistent with poorly differentiated carcinoma consistent with small cell carcinoma.  Tumor was positive for TTF-1 and neuroendocrine markers synaptophysin.  Negative for Napsin A.  Negative for squamous cell marker p40.FNA of the right  paratracheal adenopathy was done and was negative for malignancy but left-sided mediastinal and hilar lymph nodes were not sampled at the time of bronchoscopy.  He uses 2 L of oxygen  mainly at night but not during the day   PET CT scan in July 2025 showed left upper lobe lung mass with an SUV of 15.7 consistent with neoplasm.  Additional small adjacent nodules in the left upper lobe.  Several left-sided upper mediastinal hypermetabolic lymph nodes including an AP window node with an SUV of 13.8 measuring 3 x 2.3 cm.  Another lymph node to the left of the ascending aorta measuring 3.6 x 1.7 cm with an SUV of 15.9.  Small nodes superior to this level at the level of great vessels as well as additional suspicious nodes in the right paratracheal region with an SUV of 4.4.  Area of uptake along the proximal left humeral shaft with an SUV of 9.3 which could be an area of isolated bone metastases.  Low level of uptake in the right inguinal lymph node with an SUV of 3.7.   Patient had MRI of his left humerus which showed a 6 cm mass in the intramedullary region in the diaphysis of mid to proximal left humerus which was concerning for metastatic disease.  Patient is still being treated with the potential curative intent with a combination chemoradiation followed by consideration for palliative radiation to left humerus lesion   Interval history-patient tolerated cycle 1 of chemotherapy well without any significant side effects.  He reports improvement in his breathing.  Denies any nausea vomiting or diarrhea.  ECOG PS- 2 Pain scale- 0 Opioid associated constipation- no  Review of systems- Review of Systems  Constitutional:  Negative for chills, fever, malaise/fatigue and weight loss.  HENT:  Negative for congestion, ear discharge and nosebleeds.   Eyes:  Negative for blurred vision.  Respiratory:  Negative for cough, hemoptysis, sputum production, shortness of breath and wheezing.   Cardiovascular:   Negative for chest pain, palpitations, orthopnea and claudication.  Gastrointestinal:  Negative for abdominal pain, blood in stool, constipation, diarrhea, heartburn, melena, nausea and vomiting.  Genitourinary:  Negative for dysuria, flank pain, frequency, hematuria and urgency.  Musculoskeletal:  Negative for back pain, joint pain and myalgias.  Skin:  Negative for rash.  Neurological:  Negative for dizziness, tingling, focal weakness, seizures, weakness and headaches.  Endo/Heme/Allergies:  Does not bruise/bleed easily.  Psychiatric/Behavioral:  Negative for depression and suicidal ideas. The patient does not have insomnia.       Allergies  Allergen Reactions   Hctz [Hydrochlorothiazide] Swelling   Ibuprofen Swelling     Past Medical History:  Diagnosis Date   Alcohol  use disorder    Aortic atherosclerosis (HCC)    Bronchiolo-alveolar adenocarcinoma of left lung (HCC)    CHF (congestive heart failure) (HCC)    Chronic hip pain    Chronic pancreatitis (HCC)    Cigarette smoker    CKD (chronic kidney disease) stage 3, GFR 30-59 ml/min (HCC)    COPD (chronic obstructive pulmonary disease) (HCC)    Crack cocaine use    as of 01-21-24, pt states he last used a few weeks ago   Depression    DM (diabetes mellitus), type 2 (HCC)    Dyspnea    ED (erectile dysfunction)    ESBL (extended spectrum beta-lactamase) producing bacteria infection 2021   GERD (gastroesophageal reflux disease)    Hyperlipidemia    Hypertension    ILD (interstitial lung disease) (HCC)    Marijuana use    Osteoarthritis    Pneumonia    PTSD (post-traumatic stress disorder)    Seizure (HCC)    x1 years ago   Viral infection of left eye      Past Surgical History:  Procedure Laterality Date   BRONCHOSCOPY, WITH BIOPSY USING ELECTROMAGNETIC NAVIGATION Left 01/24/2024   Procedure: BRONCHOSCOPY, WITH BIOPSY USING ELECTROMAGNETIC NAVIGATION;  Surgeon: Isaiah Scrivener, MD;  Location: [facility] ORS;  Service:  Pulmonary;  Laterality: Left;   ENDOBRONCHIAL ULTRASOUND Right 01/24/2024   Procedure: ENDOBRONCHIAL ULTRASOUND (EBUS);  Surgeon: Kasa, Kurian, MD;  Location: [facility] ORS;  Service: Pulmonary;  Laterality: Right;   IR IMAGING GUIDED PORT INSERTION  03/15/2024   pancreatic stent placement      Social History   Socioeconomic History   Marital status: Legally Separated    Spouse name: Not on file   Number of children: 2   Years of education: Not on file   Highest education level: High school graduate  Occupational History   Occupation: on disability  Tobacco Use   Smoking status: Former    Current packs/day: 3.00    Average packs/day: 3.0 packs/day for 15.0 years (45.0 ttl pk-yrs)    Types: Cigarettes   Smokeless tobacco: Never   Tobacco comments:    At his heaviest smoked 3ppd and now occasionally smoke 1 cigarette. 02/01/24  Vaping Use   Vaping status: Some Days  Substance and Sexual Activity   Alcohol  use: Yes    Alcohol /week: 7.0 standard drinks of alcohol     Types: 7 Cans of beer per week    Comment: occ   Drug use: Yes    Types: Marijuana, Crack cocaine  Comment: Marijuana every day. 7-71yrs clean of crack cocaine.   Sexual activity: Yes    Birth control/protection: Condom  Other Topics Concern   Not on file  Social History Narrative   Not on file   Social Drivers of Health   Financial Resource Strain: Not on file  Food Insecurity: No Food Insecurity (01/31/2024)   Hunger Vital Sign    Worried About Running Out of Food in the Last Year: Never true    Ran Out of Food in the Last Year: Never true  Recent Concern: Food Insecurity - Food Insecurity Present (01/15/2024)   Hunger Vital Sign    Worried About Running Out of Food in the Last Year: Sometimes true    Ran Out of Food in the Last Year: Sometimes true  Transportation Needs: No Transportation Needs (01/31/2024)   PRAPARE - Administrator, Civil Service (Medical): No    Lack of Transportation  (Non-Medical): No  Physical Activity: Not on file  Stress: Not on file  Social Connections: Not on file  Intimate Partner Violence: Not At Risk (01/31/2024)   Humiliation, Afraid, Rape, and Kick questionnaire    Fear of Current or Ex-Partner: No    Emotionally Abused: No    Physically Abused: No    Sexually Abused: No    Family History  Problem Relation Age of Onset   Diabetes Mother    Sleep apnea Mother    Emphysema Sister    CAD Neg Hx    Mental illness Neg Hx      Current Outpatient Medications:    Accu-Chek Softclix Lancets lancets, Use with blood glucose monitor in the morning, at noon, and at bedtime. May substitute to any manufacturer covered by patient's insurance., Disp: 100 each, Rfl: 0   amLODipine -olmesartan  (AZOR ) 10-40 MG tablet, Take 1 tablet by mouth daily., Disp: 90 tablet, Rfl: 1   Blood Glucose Monitoring Suppl (BLOOD GLUCOSE MONITOR SYSTEM) w/Device KIT, Check blood sugar in the morning, at noon, and at bedtime. May substitute to any manufacturer covered by patient's insurance., Disp: 1 kit, Rfl: 0   dexamethasone  (DECADRON ) 4 MG tablet, Take 2 tablets (8 mg total) by mouth daily. Take for 1 day starting the day after chemotherapy on day 4. Take with food., Disp: 30 tablet, Rfl: 1   empagliflozin  (JARDIANCE ) 10 MG TABS tablet, Take 1 tablet (10 mg total) by mouth daily before breakfast., Disp: 90 tablet, Rfl: 1   ipratropium-albuterol  (DUONEB) 0.5-2.5 (3) MG/3ML SOLN, Take 3 mLs by nebulization every 2 (two) hours as needed (wheeze, SOB). Can use instead of albuterol  inhaler, Disp: 60 mL, Rfl: 1   lidocaine -prilocaine  (EMLA ) cream, Apply to affected area once, Disp: 30 g, Rfl: 3   lovastatin  (MEVACOR ) 10 MG tablet, Take 1 tablet (10 mg total) by mouth at bedtime., Disp: 90 tablet, Rfl: 1   metFORMIN  (GLUCOPHAGE ) 500 MG tablet, Take 1 tablet (500 mg total) by mouth daily with breakfast., Disp: 90 tablet, Rfl: 1   naproxen  (NAPROSYN ) 250 MG tablet, Take 250 mg by  mouth 2 (two) times daily with a meal., Disp: , Rfl:    Nebulizers (COMPRESSOR/NEBULIZER) MISC, Nebulizer and tubes/mask to use as directed with DuoNeb as needed, Disp: 1 each, Rfl: 0   nicotine  (NICODERM CQ  - DOSED IN MG/24 HOURS) 21 mg/24hr patch, Place 1 patch (21 mg total) onto the skin daily., Disp: 28 patch, Rfl: 0   omeprazole  (PRILOSEC) 20 MG capsule, Take 1 capsule (20 mg total) by mouth  daily., Disp: 90 capsule, Rfl: 1   ondansetron  (ZOFRAN ) 8 MG tablet, Take 1 tablet (8 mg total) by mouth every 8 (eight) hours as needed for nausea or vomiting. Start on the third day after cisplatin ., Disp: 30 tablet, Rfl: 1   OXYGEN , Inhale 2 L into the lungs as needed., Disp: , Rfl:    Pancrelipase , Lip-Prot-Amyl, (ZENPEP ) 15000-47000 units CPEP, Take 1 capsule (15,000 Units total) by mouth 3 (three) times daily., Disp: 270 capsule, Rfl: 1   prochlorperazine  (COMPAZINE ) 10 MG tablet, Take 1 tablet (10 mg total) by mouth every 6 (six) hours as needed for nausea or vomiting., Disp: 30 tablet, Rfl: 1   sertraline  (ZOLOFT ) 100 MG tablet, Take 1 tablet (100 mg total) by mouth daily., Disp: 90 tablet, Rfl: 0   spironolactone  (ALDACTONE ) 25 MG tablet, Take 1 tablet (25 mg total) by mouth daily., Disp: 90 tablet, Rfl: 3   traZODone  (DESYREL ) 150 MG tablet, Take 1 tablet (150 mg total) by mouth at bedtime as needed for sleep., Disp: 90 tablet, Rfl: 1   VENTOLIN  HFA 108 (90 Base) MCG/ACT inhaler, Inhale 1-2 puffs into the lungs every 6 (six) hours as needed for wheezing or shortness of breath (or coughing)., Disp: 18 g, Rfl: 6 No current facility-administered medications for this visit.  Facility-Administered Medications Ordered in Other Visits:    filgrastim -sndz (ZARXIO ) injection 480 mcg, 480 mcg, Subcutaneous, Daily, Turhan Chill C, MD, 480 mcg at 03/21/24 0939  Physical exam:  Vitals:   03/21/24 0850  BP: (!) 150/86  Pulse: 73  Resp: 16  Temp: 98.8 F (37.1 C)  TempSrc: Tympanic  SpO2: 97%  Weight:  166 lb (75.3 kg)   Physical Exam Constitutional:      Comments: Sitting in a wheelchair.  Appears in no acute distress  Cardiovascular:     Rate and Rhythm: Normal rate and regular rhythm.     Heart sounds: Normal heart sounds.  Pulmonary:     Effort: Pulmonary effort is normal.     Breath sounds: Normal breath sounds.  Abdominal:     General: Bowel sounds are normal.     Palpations: Abdomen is soft.  Skin:    General: Skin is warm and dry.  Neurological:     Mental Status: He is alert and oriented to person, place, and time.      I have personally reviewed labs listed below:    Latest Ref Rng & Units 03/21/2024    8:34 AM  CMP  Glucose 70 - 99 mg/dL 882   BUN 6 - 20 mg/dL 11   Creatinine 9.38 - 1.24 mg/dL 8.94   Sodium 864 - 854 mmol/L 134   Potassium 3.5 - 5.1 mmol/L 3.9   Chloride 98 - 111 mmol/L 103   CO2 22 - 32 mmol/L 22   Calcium  8.9 - 10.3 mg/dL 8.7   Total Protein 6.5 - 8.1 g/dL 7.8   Total Bilirubin 0.0 - 1.2 mg/dL 0.4   Alkaline Phos 38 - 126 U/L 89   AST 15 - 41 U/L 23   ALT 0 - 44 U/L 12       Latest Ref Rng & Units 03/21/2024    8:34 AM  CBC  WBC 4.0 - 10.5 K/uL 2.8   Hemoglobin 13.0 - 17.0 g/dL 87.6   Hematocrit 60.9 - 52.0 % 36.2   Platelets 150 - 400 K/uL 253    I have personally reviewed Radiology images listed below: No images are attached to  the encounter.  IR IMAGING GUIDED PORT INSERTION Result Date: 03/15/2024 CLINICAL DATA:  Lung cancer, access for chemotherapy EXAM: RIGHT INTERNAL JUGULAR SINGLE LUMEN POWER PORT CATHETER INSERTION Date:  03/15/2024 03/15/2024 1:45 pm Radiologist:  CHRISTELLA. Frederic Specking, MD Guidance:  Ultrasound and fluoroscopic MEDICATIONS: 1% lidocaine  local with epinephrine  ANESTHESIA/SEDATION: Versed  2.0 mg IV; Fentanyl  100 mcg IV; Moderate Sedation Time:  20 minute The patient was continuously monitored during the procedure by the interventional radiology nurse under my direct supervision. FLUOROSCOPY: 0 minutes, 36 seconds (4  mGy) COMPLICATIONS: None immediate. CONTRAST:  None. PROCEDURE: Informed consent was obtained from the patient following explanation of the procedure, risks, benefits and alternatives. The patient understands, agrees and consents for the procedure. All questions were addressed. A time out was performed. Maximal barrier sterile technique utilized including caps, mask, sterile gowns, sterile gloves, large sterile drape, hand hygiene, and 2% chlorhexidine  scrub. Under sterile conditions and local anesthesia, right internal jugular micropuncture venous access was performed. Access was performed with ultrasound. Images were obtained for documentation of the patent right internal jugular vein. A guide wire was inserted followed by a transitional dilator. This allowed insertion of a guide wire and catheter into the IVC. Measurements were obtained from the SVC / RA junction back to the right IJ venotomy site. In the right infraclavicular chest, a subcutaneous pocket was created over the second anterior rib. This was done under sterile conditions and local anesthesia. 1% lidocaine  with epinephrine  was utilized for this. A 2.5 cm incision was made in the skin. Blunt dissection was performed to create a subcutaneous pocket over the right pectoralis major muscle. The pocket was flushed with saline vigorously. There was adequate hemostasis. The port catheter was assembled and checked for leakage. The port catheter was secured in the pocket with two retention sutures. The tubing was tunneled subcutaneously to the right venotomy site and inserted into the SVC/RA junction through a valved peel-away sheath. Position was confirmed with fluoroscopy. Images were obtained for documentation. The patient tolerated the procedure well. No immediate complications. Incisions were closed in a two layer fashion with 4 - 0 Vicryl suture. Dermabond was applied to the skin. The port catheter was accessed, blood was aspirated followed by saline and  heparin  flushes. Needle was removed. A dry sterile dressing was applied. IMPRESSION: Ultrasound and fluoroscopically guided right internal jugular single lumen power port catheter insertion. Tip in the SVC/RA junction. Catheter ready for use. Electronically Signed   By: CHRISTELLA.  Shick M.D.   On: 03/15/2024 13:55   MR HUMERUS LEFT W WO CONTRAST Result Date: 03/02/2024 CLINICAL DATA:  Metastatic small cell lung cancer, suspicion for left humeral involvement based on PET scan EXAM: MRI OF THE LEFT HUMERUS WITHOUT AND WITH CONTRAST TECHNIQUE: Multiplanar, multisequence MR imaging of the left humerus was performed before and after the administration of intravenous contrast. CONTRAST:  7.5mL GADAVIST  GADOBUTROL  1 MMOL/ML IV SOLN COMPARISON:  PET-CT 02/16/2024 FINDINGS: Bones/Joint/Cartilage MRI confirms a 6.5 by 1.6 by 1.5 cm primarily intramedullary enhancing lesion compatible with metastatic disease in the diaphysis of the mid to proximal left humerus. This has high T2 low T1 signal characteristics and there is some mild medial periostitis along the humerus although no obvious or well-defined cortical breakthrough or extraosseous extension of tumor. The lesion enhances with some central hypoenhancement and corresponds to the hypermetabolic lesion at PET-CT. Small degenerative lesions are identified along the humeral head. Ligaments N/A Muscles and Tendons Unremarkable Soft tissues A left upper lobe mass is observed  on image 36 series 14, previously hypermetabolic on PET-CT. This abuts the pleural margin without well-defined chest wall invasion. IMPRESSION: 1. MRI confirms a 6.5 by 1.6 by 1.5 cm primarily intramedullary enhancing lesion in the diaphysis of the mid to proximal left humerus, compatible with metastatic disease. There is some mild medial periostitis along the humerus although no obvious or well-defined cortical breakthrough or extraosseous extension of tumor. 2. Left upper lobe mass, previously hypermetabolic on  PET-CT. 3. Small degenerative lesions along the humeral head. Electronically Signed   By: Ryan Salvage M.D.   On: 03/02/2024 16:01     Assessment and plan- Patient is a 58 y.o. male with history of newly diagnosed limited stage small cell lung cancer and possible left humeral metastases here for on treatment assessment prior to cycle 2 of cisplatin  etoposide  chemotherapy  PET scan mainly showed evidence of left upper lobe lung mass and locoregional mediastinal adenopathy.  It showed nonspecific uptake in the left humerus which was concerning for metastatic disease.  MRI of the left humerus shows a 6 cm intramedullary lesion in the mid to proximal left humerus.  This could not be biopsied to confirm metastatic disease.  He is being treated as limited small cell lung cancer however and is getting definitive chemoradiation with cisplatin  etoposide .  Plan is to consider palliative radiation to the left humeral lesion down the line.  I will also consider maintenance immunotherapy upon completion of concurrent chemoradiation.  Patient tolerated cycle 1 of chemotherapy well.  He is due for cycle 2 today.  However his white cell count is 2.8 today with an ANC of 0.3.  Hemoglobin and platelets are unremarkable.  I am deferring treatment out by 1 week.  He is unable to get Neulasta due to ongoing radiation treatment.  I will plan to give him 3 days of Zarzio starting today and hopefully his counts will be up next week to receive cycle 2 of chemotherapy.  Once he completes radiation we would be able to add Neulasta with chemotherapy.  He will be seen by NP Fonda Mower in 10 days with labs for possible IV fluids.  I will see him back in 4 weeks from today for cycle 3 of cisplatin  etoposide  chemotherapy   Visit Diagnosis 1. Small cell lung cancer, left upper lobe (HCC)   2. Chemotherapy induced neutropenia (HCC)      Dr. Annah Skene, MD, MPH [facility] [phone] 03/21/2024 12:10 PM

## 2024-03-22 ENCOUNTER — Inpatient Hospital Stay

## 2024-03-22 ENCOUNTER — Telehealth: Payer: Self-pay | Admitting: Oncology

## 2024-03-22 ENCOUNTER — Ambulatory Visit
Admission: RE | Admit: 2024-03-22 | Discharge: 2024-03-22 | Disposition: A | Discharge status: Home / Self Care | Source: Ambulatory Visit | Attending: Radiation Oncology | Admitting: Radiation Oncology

## 2024-03-22 ENCOUNTER — Other Ambulatory Visit: Payer: Self-pay

## 2024-03-22 DIAGNOSIS — C3412 Malignant neoplasm of upper lobe, left bronchus or lung: Secondary | ICD-10-CM | POA: Diagnosis not present

## 2024-03-22 DIAGNOSIS — Z87891 Personal history of nicotine dependence: Secondary | ICD-10-CM | POA: Diagnosis not present

## 2024-03-22 DIAGNOSIS — T451X5A Adverse effect of antineoplastic and immunosuppressive drugs, initial encounter: Secondary | ICD-10-CM

## 2024-03-22 DIAGNOSIS — Z5111 Encounter for antineoplastic chemotherapy: Secondary | ICD-10-CM | POA: Diagnosis not present

## 2024-03-22 DIAGNOSIS — Z510 Encounter for antineoplastic radiation therapy: Secondary | ICD-10-CM | POA: Diagnosis not present

## 2024-03-22 LAB — RAD ONC ARIA SESSION SUMMARY
Course Elapsed Days: 1
Plan Fractions Treated to Date: 2
Plan Prescribed Dose Per Fraction: 2 Gy
Plan Total Fractions Prescribed: 30
Plan Total Prescribed Dose: 60 Gy
Reference Point Dosage Given to Date: 4 Gy
Reference Point Session Dosage Given: 2 Gy
Session Number: 2

## 2024-03-22 MED ORDER — FILGRASTIM-SNDZ 480 MCG/0.8ML IJ SOSY
480.0000 ug | PREFILLED_SYRINGE | Freq: Every day | INTRAMUSCULAR | Status: DC
  Administered 2024-03-22: 480 ug via SUBCUTANEOUS
  Filled 2024-03-22: qty 0.8

## 2024-03-22 NOTE — Telephone Encounter (Signed)
 Noah Velasquez sent a message through our answering service to cancel his appt for inj 03/24/2024 & 03/25/2024 due to transportaton issues. Team was notified.

## 2024-03-23 ENCOUNTER — Inpatient Hospital Stay

## 2024-03-23 ENCOUNTER — Other Ambulatory Visit: Payer: Self-pay

## 2024-03-23 ENCOUNTER — Ambulatory Visit
Admission: RE | Admit: 2024-03-23 | Discharge: 2024-03-23 | Disposition: A | Discharge status: Home / Self Care | Source: Ambulatory Visit | Attending: Radiation Oncology | Admitting: Radiation Oncology

## 2024-03-23 DIAGNOSIS — Z87891 Personal history of nicotine dependence: Secondary | ICD-10-CM | POA: Diagnosis not present

## 2024-03-23 DIAGNOSIS — T451X5A Adverse effect of antineoplastic and immunosuppressive drugs, initial encounter: Secondary | ICD-10-CM

## 2024-03-23 DIAGNOSIS — C3412 Malignant neoplasm of upper lobe, left bronchus or lung: Secondary | ICD-10-CM | POA: Diagnosis not present

## 2024-03-23 DIAGNOSIS — Z510 Encounter for antineoplastic radiation therapy: Secondary | ICD-10-CM | POA: Diagnosis not present

## 2024-03-23 DIAGNOSIS — Z5111 Encounter for antineoplastic chemotherapy: Secondary | ICD-10-CM | POA: Diagnosis not present

## 2024-03-23 LAB — RAD ONC ARIA SESSION SUMMARY
Course Elapsed Days: 2
Plan Fractions Treated to Date: 3
Plan Prescribed Dose Per Fraction: 2 Gy
Plan Total Fractions Prescribed: 30
Plan Total Prescribed Dose: 60 Gy
Reference Point Dosage Given to Date: 6 Gy
Reference Point Session Dosage Given: 2 Gy
Session Number: 3

## 2024-03-23 MED ORDER — FILGRASTIM-SNDZ 480 MCG/0.8ML IJ SOSY
480.0000 ug | PREFILLED_SYRINGE | Freq: Every day | INTRAMUSCULAR | Status: DC
  Administered 2024-03-23: 480 ug via SUBCUTANEOUS
  Filled 2024-03-23: qty 0.8

## 2024-03-24 ENCOUNTER — Ambulatory Visit
Admission: RE | Admit: 2024-03-24 | Discharge: 2024-03-24 | Disposition: A | Discharge status: Home / Self Care | Source: Ambulatory Visit | Attending: Radiation Oncology | Admitting: Radiation Oncology

## 2024-03-24 ENCOUNTER — Other Ambulatory Visit: Payer: Self-pay

## 2024-03-24 DIAGNOSIS — Z87891 Personal history of nicotine dependence: Secondary | ICD-10-CM | POA: Diagnosis not present

## 2024-03-24 DIAGNOSIS — C3412 Malignant neoplasm of upper lobe, left bronchus or lung: Secondary | ICD-10-CM | POA: Diagnosis not present

## 2024-03-24 DIAGNOSIS — Z5111 Encounter for antineoplastic chemotherapy: Secondary | ICD-10-CM | POA: Diagnosis not present

## 2024-03-24 DIAGNOSIS — Z510 Encounter for antineoplastic radiation therapy: Secondary | ICD-10-CM | POA: Diagnosis not present

## 2024-03-24 LAB — RAD ONC ARIA SESSION SUMMARY
Course Elapsed Days: 3
Plan Fractions Treated to Date: 4
Plan Prescribed Dose Per Fraction: 2 Gy
Plan Total Fractions Prescribed: 30
Plan Total Prescribed Dose: 60 Gy
Reference Point Dosage Given to Date: 8 Gy
Reference Point Session Dosage Given: 2 Gy
Session Number: 4

## 2024-03-27 ENCOUNTER — Other Ambulatory Visit: Payer: Self-pay

## 2024-03-27 ENCOUNTER — Ambulatory Visit
Admission: RE | Admit: 2024-03-27 | Discharge: 2024-03-27 | Disposition: A | Discharge status: Home / Self Care | Source: Ambulatory Visit | Attending: Radiation Oncology | Admitting: Radiation Oncology

## 2024-03-27 ENCOUNTER — Telehealth: Payer: Self-pay | Admitting: Family

## 2024-03-27 DIAGNOSIS — Z87891 Personal history of nicotine dependence: Secondary | ICD-10-CM | POA: Diagnosis not present

## 2024-03-27 DIAGNOSIS — Z510 Encounter for antineoplastic radiation therapy: Secondary | ICD-10-CM | POA: Diagnosis not present

## 2024-03-27 DIAGNOSIS — Z5111 Encounter for antineoplastic chemotherapy: Secondary | ICD-10-CM | POA: Diagnosis not present

## 2024-03-27 DIAGNOSIS — C3412 Malignant neoplasm of upper lobe, left bronchus or lung: Secondary | ICD-10-CM | POA: Diagnosis not present

## 2024-03-27 LAB — RAD ONC ARIA SESSION SUMMARY
Course Elapsed Days: 6
Plan Fractions Treated to Date: 5
Plan Prescribed Dose Per Fraction: 2 Gy
Plan Total Fractions Prescribed: 30
Plan Total Prescribed Dose: 60 Gy
Reference Point Dosage Given to Date: 10 Gy
Reference Point Session Dosage Given: 2 Gy
Session Number: 5

## 2024-03-27 NOTE — Progress Notes (Deleted)
 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   Admitted 01/14/24 with wheezing and shortness of breath. On admission, BNP was 317.8, HS-troponin was 26 > 23, UDS positive for cocaine, and TSH was 1.165. Chest x-ray noted pulmonary vascular congestion without airspace edema. 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 Echocardiogram 01/15/24 noted LVEFof 50-55%, mild LVH, grade I diastolic dysfunction. Initially needed oxygen  at 2L and unable to be weaned off of it. Brain MRI negative. Bronchoscopy and biopsy to be done as outpatient.   Seen in [facility] 06/25 where jardiance  was started.   Seen in [facility] 07/25 where spironolactone  25mg  daily was started.   He presents today for a HF follow-up visit with a chief complaint of   Denies tobacco use. Drinking 40 oz beer every other day (was drinking 80 oz), marijuana use   ROS: All systems negative except what is listed in HPI, PMH and Problem List   Past Medical History:  Diagnosis Date   Alcohol  use disorder    Aortic atherosclerosis (HCC)    Bronchiolo-alveolar adenocarcinoma of left lung (HCC)    CHF (congestive heart failure) (HCC)    Chronic hip pain    Chronic pancreatitis (HCC)    Cigarette smoker    CKD (chronic kidney disease) stage 3, GFR 30-59 ml/min (HCC)    COPD (chronic obstructive pulmonary disease) (HCC)    Crack cocaine use    as of 01-21-24, pt states he last used a few weeks ago   Depression    DM (diabetes mellitus), type 2 (HCC)    Dyspnea    ED (erectile dysfunction)    ESBL (extended spectrum beta-lactamase) producing bacteria infection 2021   GERD (gastroesophageal  reflux disease)    Hyperlipidemia    Hypertension    ILD (interstitial lung disease) (HCC)    Marijuana use    Osteoarthritis    Pneumonia    PTSD (post-traumatic stress disorder)    Seizure (HCC)    x1 years ago   Viral infection of left eye     Current Outpatient Medications  Medication Sig Dispense Refill   Accu-Chek Softclix Lancets lancets Use with blood glucose monitor in the morning, at noon, and at bedtime. May substitute to any manufacturer covered by patient's insurance. 100 each 0   amLODipine -olmesartan  (AZOR ) 10-40 MG tablet Take 1 tablet by mouth daily. 90 tablet 1   Blood Glucose Monitoring Suppl (BLOOD GLUCOSE MONITOR SYSTEM) w/Device KIT Check blood sugar in the morning, at noon, and at bedtime. May substitute to any manufacturer covered by patient's insurance. 1 kit 0   dexamethasone  (DECADRON ) 4 MG tablet Take 2 tablets (8 mg total) by mouth daily. Take for 1 day starting the day after chemotherapy on day 4. Take with food. 30 tablet 1   empagliflozin  (JARDIANCE ) 10 MG TABS tablet Take 1 tablet (10 mg total) by mouth daily before breakfast. 90 tablet 1   ipratropium-albuterol  (DUONEB) 0.5-2.5 (3) MG/3ML SOLN Take 3 mLs by nebulization every 2 (two) hours as needed (wheeze, SOB). Can use instead of albuterol  inhaler 60 mL 1   lidocaine -prilocaine  (EMLA ) cream Apply to affected area once 30 g 3   lovastatin  (  MEVACOR ) 10 MG tablet Take 1 tablet (10 mg total) by mouth at bedtime. 90 tablet 1   metFORMIN  (GLUCOPHAGE ) 500 MG tablet Take 1 tablet (500 mg total) by mouth daily with breakfast. 90 tablet 1   naproxen  (NAPROSYN ) 250 MG tablet Take 250 mg by mouth 2 (two) times daily with a meal.     Nebulizers (COMPRESSOR/NEBULIZER) MISC Nebulizer and tubes/mask to use as directed with DuoNeb as needed 1 each 0   nicotine  (NICODERM CQ  - DOSED IN MG/24 HOURS) 21 mg/24hr patch Place 1 patch (21 mg total) onto the skin daily. 28 patch 0   omeprazole  (PRILOSEC) 20 MG capsule Take 1  capsule (20 mg total) by mouth daily. 90 capsule 1   ondansetron  (ZOFRAN ) 8 MG tablet Take 1 tablet (8 mg total) by mouth every 8 (eight) hours as needed for nausea or vomiting. Start on the third day after cisplatin . 30 tablet 1   OXYGEN  Inhale 2 L into the lungs as needed.     Pancrelipase , Lip-Prot-Amyl, (ZENPEP ) 15000-47000 units CPEP Take 1 capsule (15,000 Units total) by mouth 3 (three) times daily. 270 capsule 1   prochlorperazine  (COMPAZINE ) 10 MG tablet Take 1 tablet (10 mg total) by mouth every 6 (six) hours as needed for nausea or vomiting. 30 tablet 1   sertraline  (ZOLOFT ) 100 MG tablet Take 1 tablet (100 mg total) by mouth daily. 90 tablet 0   spironolactone  (ALDACTONE ) 25 MG tablet Take 1 tablet (25 mg total) by mouth daily. 90 tablet 3   traZODone  (DESYREL ) 150 MG tablet Take 1 tablet (150 mg total) by mouth at bedtime as needed for sleep. 90 tablet 1   VENTOLIN  HFA 108 (90 Base) MCG/ACT inhaler Inhale 1-2 puffs into the lungs every 6 (six) hours as needed for wheezing or shortness of breath (or coughing). 18 g 6   No current facility-administered medications for this visit.    Allergies  Allergen Reactions   Hctz [Hydrochlorothiazide] Swelling   Ibuprofen Swelling      Social History   Socioeconomic History   Marital status: Legally Separated    Spouse name: Not on file   Number of children: 2   Years of education: Not on file   Highest education level: High school graduate  Occupational History   Occupation: on disability  Tobacco Use   Smoking status: Former    Current packs/day: 3.00    Average packs/day: 3.0 packs/day for 15.0 years (45.0 ttl pk-yrs)    Types: Cigarettes   Smokeless tobacco: Never   Tobacco comments:    At his heaviest smoked 3ppd and now occasionally smoke 1 cigarette. 02/01/24  Vaping Use   Vaping status: Some Days  Substance and Sexual Activity   Alcohol  use: Yes    Alcohol /week: 7.0 standard drinks of alcohol     Types: 7 Cans of beer  per week    Comment: occ   Drug use: Yes    Types: Marijuana, Crack cocaine    Comment: Marijuana every day. 7-85yrs clean of crack cocaine.   Sexual activity: Yes    Birth control/protection: Condom  Other Topics Concern   Not on file  Social History Narrative   Not on file   Social Drivers of Health   Financial Resource Strain: Not on file  Food Insecurity: No Food Insecurity (01/31/2024)   Hunger Vital Sign    Worried About Running Out of Food in the Last Year: Never true    Ran Out of Food in the  Last Year: Never true  Recent Concern: Food Insecurity - Food Insecurity Present (01/15/2024)   Hunger Vital Sign    Worried About Running Out of Food in the Last Year: Sometimes true    Ran Out of Food in the Last Year: Sometimes true  Transportation Needs: No Transportation Needs (01/31/2024)   PRAPARE - Administrator, Civil Service (Medical): No    Lack of Transportation (Non-Medical): No  Physical Activity: Not on file  Stress: Not on file  Social Connections: Not on file  Intimate Partner Violence: Not At Risk (01/31/2024)   Humiliation, Afraid, Rape, and Kick questionnaire    Fear of Current or Ex-Partner: No    Emotionally Abused: No    Physically Abused: No    Sexually Abused: No      Family History  Problem Relation Age of Onset   Diabetes Mother    Sleep apnea Mother    Emphysema Sister    CAD Neg Hx    Mental illness Neg Hx       PHYSICAL EXAM:  General: Well appearing male in wheelchair. No resp difficulty HEENT: normal Neck: supple, no JVD Cor: Regular rhythm, rate. No rubs, gallops or murmurs Lungs: clear Abdomen: soft, nontender, nondistended. Extremities: no cyanosis, clubbing, rash, trace pitting edema bilateral lower legs Neuro: alert & oriented X 3. Moves all 4 extremities w/o difficulty. Affect pleasant   ECG: not done   ASSESSMENT & PLAN:  1: NICM with preserved ejection fraction- - suspect due to HTN - NYHA class II -  euvolemic - weight 165 pounds from last visit here 1 month ago - Echo 01/15/24: LVEFof 50-55%, mild LVH, G1DD - continue amlodipine -olmesartan  10/40mg  daily for now - continue jardiance  10mg  daily - continue spironolactone  25mg  daily - BNP 01/15/24 was 317.8  2: HTN- - BP  - saw PCP Georgina) - BMET 03/28/24 reviewed: sodium 132, potassium 4.0, creatinine 1.18 & GFR >60  3: Lung cancer left upper lobe- - new diagnosis 05/25 - had bronchoscopy/ biopsy done 01/24/24 - saw pulmonology (Kasa) 06/25 - PET scan done 02/16/24 - saw oncology Linnea) 08/25  4: DM- - A1c 12/07/23 was 6.5%  5: Substance use- - no tobacco use - drinking 40 oz beer every other day (was drinking 80 oz every other day) - + marijuana - cessation discussed   6: Hyperlipidemia- - continue lovastatin  10mg  daily - LD 12/07/23 was [address]     Ellouise DELENA Class, [place] 03/27/24

## 2024-03-27 NOTE — Telephone Encounter (Signed)
 Called to confirm/remind patient of their appointment at the [facility] on 03/28/24.   Appointment:   [x] Confirmed  [] Left mess   [] No answer/No voice mail  [] VM Full/unable to leave message  [] Phone not in service  Patient reminded to bring all medications and/or complete list.  Confirmed patient has transportation. Gave directions, instructed to utilize valet parking.

## 2024-03-28 ENCOUNTER — Encounter: Admitting: Family

## 2024-03-28 ENCOUNTER — Telehealth: Payer: Self-pay | Admitting: Family

## 2024-03-28 ENCOUNTER — Encounter: Payer: Self-pay | Admitting: *Deleted

## 2024-03-28 ENCOUNTER — Inpatient Hospital Stay (HOSPITAL_BASED_OUTPATIENT_CLINIC_OR_DEPARTMENT_OTHER): Admitting: Oncology

## 2024-03-28 ENCOUNTER — Other Ambulatory Visit

## 2024-03-28 ENCOUNTER — Inpatient Hospital Stay

## 2024-03-28 ENCOUNTER — Ambulatory Visit
Admission: RE | Admit: 2024-03-28 | Discharge: 2024-03-28 | Disposition: A | Discharge status: Home / Self Care | Source: Ambulatory Visit | Attending: Radiation Oncology | Admitting: Radiation Oncology

## 2024-03-28 ENCOUNTER — Other Ambulatory Visit: Payer: Self-pay

## 2024-03-28 ENCOUNTER — Ambulatory Visit

## 2024-03-28 ENCOUNTER — Encounter: Payer: Self-pay | Admitting: Oncology

## 2024-03-28 VITALS — BP 137/90 | HR 84 | Temp 99.0°F | Resp 19 | Ht 67.5 in | Wt 165.1 lb

## 2024-03-28 DIAGNOSIS — Z87891 Personal history of nicotine dependence: Secondary | ICD-10-CM | POA: Diagnosis not present

## 2024-03-28 DIAGNOSIS — Z5111 Encounter for antineoplastic chemotherapy: Secondary | ICD-10-CM

## 2024-03-28 DIAGNOSIS — Z510 Encounter for antineoplastic radiation therapy: Secondary | ICD-10-CM | POA: Diagnosis not present

## 2024-03-28 DIAGNOSIS — C3412 Malignant neoplasm of upper lobe, left bronchus or lung: Secondary | ICD-10-CM

## 2024-03-28 LAB — CBC WITH DIFFERENTIAL (CANCER CENTER ONLY)
Abs Immature Granulocytes: 0.12 K/uL — ABNORMAL HIGH (ref 0.00–0.07)
Basophils Absolute: 0.1 K/uL (ref 0.0–0.1)
Basophils Relative: 1 %
Eosinophils Absolute: 0.1 K/uL (ref 0.0–0.5)
Eosinophils Relative: 2 %
HCT: 37.4 % — ABNORMAL LOW (ref 39.0–52.0)
Hemoglobin: 12.8 g/dL — ABNORMAL LOW (ref 13.0–17.0)
Immature Granulocytes: 2 %
Lymphocytes Relative: 25 %
Lymphs Abs: 1.6 K/uL (ref 0.7–4.0)
MCH: 31.7 pg (ref 26.0–34.0)
MCHC: 34.2 g/dL (ref 30.0–36.0)
MCV: 92.6 fL (ref 80.0–100.0)
Monocytes Absolute: 1 K/uL (ref 0.1–1.0)
Monocytes Relative: 16 %
Neutro Abs: 3.4 K/uL (ref 1.7–7.7)
Neutrophils Relative %: 54 %
Platelet Count: 260 K/uL (ref 150–400)
RBC: 4.04 MIL/uL — ABNORMAL LOW (ref 4.22–5.81)
RDW: 13.6 % (ref 11.5–15.5)
WBC Count: 6.4 K/uL (ref 4.0–10.5)
nRBC: 0 % (ref 0.0–0.2)

## 2024-03-28 LAB — RAD ONC ARIA SESSION SUMMARY
Course Elapsed Days: 7
Plan Fractions Treated to Date: 6
Plan Prescribed Dose Per Fraction: 2 Gy
Plan Total Fractions Prescribed: 30
Plan Total Prescribed Dose: 60 Gy
Reference Point Dosage Given to Date: 12 Gy
Reference Point Session Dosage Given: 2 Gy
Session Number: 6

## 2024-03-28 LAB — CMP (CANCER CENTER ONLY)
ALT: 12 U/L (ref 0–44)
AST: 23 U/L (ref 15–41)
Albumin: 3.5 g/dL (ref 3.5–5.0)
Alkaline Phosphatase: 97 U/L (ref 38–126)
Anion gap: 7 (ref 5–15)
BUN: 10 mg/dL (ref 6–20)
CO2: 24 mmol/L (ref 22–32)
Calcium: 8.9 mg/dL (ref 8.9–10.3)
Chloride: 101 mmol/L (ref 98–111)
Creatinine: 1.18 mg/dL (ref 0.61–1.24)
GFR, Estimated: >60 mL/min (ref >60)
Glucose, Bld: 154 mg/dL — ABNORMAL HIGH (ref 70–99)
Potassium: 4 mmol/L (ref 3.5–5.1)
Sodium: 132 mmol/L — ABNORMAL LOW (ref 135–145)
Total Bilirubin: 0.3 mg/dL (ref 0.0–1.2)
Total Protein: 8 g/dL (ref 6.5–8.1)

## 2024-03-28 LAB — MAGNESIUM: Magnesium: 2.1 mg/dL (ref 1.7–2.4)

## 2024-03-28 MED ORDER — APREPITANT 130 MG/18ML IV EMUL
130.0000 mg | Freq: Once | INTRAVENOUS | Status: AC
Start: 2024-03-28 — End: 2024-03-28
  Administered 2024-03-28 (×2): 130 mg via INTRAVENOUS
  Filled 2024-03-28: qty 18

## 2024-03-28 MED ORDER — SODIUM CHLORIDE 0.9 % IV SOLN
60.0000 mg/m2 | Freq: Once | INTRAVENOUS | Status: AC
  Administered 2024-03-28 (×2): 113 mg via INTRAVENOUS
  Filled 2024-03-28: qty 113

## 2024-03-28 MED ORDER — POTASSIUM CHLORIDE IN NACL 20-0.9 MEQ/L-% IV SOLN
Freq: Once | INTRAVENOUS | Status: AC
  Filled 2024-03-28: qty 1000

## 2024-03-28 MED ORDER — PALONOSETRON HCL INJECTION 0.25 MG/5ML
0.2500 mg | Freq: Once | INTRAVENOUS | Status: AC
  Administered 2024-03-28 (×2): 0.25 mg via INTRAVENOUS
  Filled 2024-03-28: qty 5

## 2024-03-28 MED ORDER — MAGNESIUM SULFATE 2 GM/50ML IV SOLN
2.0000 g | Freq: Once | INTRAVENOUS | Status: AC
  Administered 2024-03-28 (×2): 2 g via INTRAVENOUS
  Filled 2024-03-28: qty 50

## 2024-03-28 MED ORDER — SODIUM CHLORIDE 0.9 % IV SOLN
100.0000 mg/m2 | Freq: Once | INTRAVENOUS | Status: AC
  Administered 2024-03-28 (×2): 200 mg via INTRAVENOUS
  Filled 2024-03-28: qty 10

## 2024-03-28 MED ORDER — SODIUM CHLORIDE 0.9 % IV SOLN
INTRAVENOUS | Status: DC
  Filled 2024-03-28: qty 250

## 2024-03-28 MED ORDER — DEXAMETHASONE SODIUM PHOSPHATE 10 MG/ML IJ SOLN
10.0000 mg | Freq: Once | INTRAMUSCULAR | Status: AC
  Administered 2024-03-28 (×2): 10 mg via INTRAVENOUS
  Filled 2024-03-28: qty 1

## 2024-03-28 NOTE — Progress Notes (Signed)
 Hematology/Oncology Consult note [facility]  [phone] [phone]  Patient Care Team: Antonette Angeline ORN, NP as PCP - General (Internal Medicine) Verdene Gills, RN as Oncology Nurse Navigator [name], MD as Consulting Physician (Oncology) Rennie Cindy SAUNDERS, MD as Consulting Physician (Oncology)   Name of the patient: Noah Velasquez  [id number]  09/13/1965   Date of visit: 03/28/24  Diagnosis-  Cancer Staging  Small cell lung cancer, left upper lobe [facility]) Staging form: Lung, AJCC V9 - Clinical stage from 02/21/2024: Stage IIIB (cT2, cN2b, cM0) - Signed by [name], MD on 02/21/2024 Method of lymph node assessment: Clinical    Chief complaint/ Reason for visit-on treatment assessment prior to cycle 2 of cisplatin  and etoposide  chemotherapy  Heme/Onc history:  Patient is a 58 year old African-American male with a past medical history significant for CHF, interstitial lung disease, type 2 diabetes.  He is mainly wheelchair-bound but lives independently.  He underwent CT chest without contrast when he had a chest x-ray which showed a lung nodule.  CT chest showed a 3.7 x 1.8 cm lobulated mass in the left upper lobe abutting the chest wall concerning for primary bronchogenic carcinoma.  Adjacent satellite nodularity in the left upper lobe.  14 mm right paratracheal node and 14 mm short axis prevascular node.  Moderate centrilobular and paraseptal emphysematous changes.  MRI brain with and without contrast showed remote infarcts and chronic microvascular disease but no evidence of acute intracranial abnormality.  Patient was seen by Dr. Isaiah and underwent bronchoscopy.  Left upper lobe lung biopsy was consistent with poorly differentiated carcinoma consistent with small cell carcinoma.  Tumor was positive for TTF-1 and neuroendocrine markers synaptophysin.  Negative for Napsin A.  Negative for squamous cell marker p40.FNA of the right  paratracheal adenopathy was done and was negative for malignancy but left-sided mediastinal and hilar lymph nodes were not sampled at the time of bronchoscopy.  He uses 2 L of oxygen  mainly at night but not during the day   PET CT scan in July 2025 showed left upper lobe lung mass with an SUV of 15.7 consistent with neoplasm.  Additional small adjacent nodules in the left upper lobe.  Several left-sided upper mediastinal hypermetabolic lymph nodes including an AP window node with an SUV of 13.8 measuring 3 x 2.3 cm.  Another lymph node to the left of the ascending aorta measuring 3.6 x 1.7 cm with an SUV of 15.9.  Small nodes superior to this level at the level of great vessels as well as additional suspicious nodes in the right paratracheal region with an SUV of 4.4.  Area of uptake along the proximal left humeral shaft with an SUV of 9.3 which could be an area of isolated bone metastases.  Low level of uptake in the right inguinal lymph node with an SUV of 3.7.   Patient had MRI of his left humerus which showed a 6 cm mass in the intramedullary region in the diaphysis of mid to proximal left humerus which was concerning for metastatic disease.  Patient is still being treated with the potential curative intent with a combination chemoradiation followed by consideration for palliative radiation to left humerus lesion      Interval history-tolerated Neupogen  well without any significant side effects.  Overall he feels well and other than baseline fatigue denies other complaints at this time  ECOG PS- 2 Pain scale- 0   Review of systems- Review of Systems  Constitutional:  Positive for malaise/fatigue. Negative for chills, fever and weight loss.  HENT:  Negative for congestion, ear discharge and nosebleeds.   Eyes:  Negative for blurred vision.  Respiratory:  Negative for cough, hemoptysis, sputum production, shortness of breath and wheezing.   Cardiovascular:  Negative for chest pain, palpitations,  orthopnea and claudication.  Gastrointestinal:  Negative for abdominal pain, blood in stool, constipation, diarrhea, heartburn, melena, nausea and vomiting.  Genitourinary:  Negative for dysuria, flank pain, frequency, hematuria and urgency.  Musculoskeletal:  Negative for back pain, joint pain and myalgias.  Skin:  Negative for rash.  Neurological:  Negative for dizziness, tingling, focal weakness, seizures, weakness and headaches.  Endo/Heme/Allergies:  Does not bruise/bleed easily.  Psychiatric/Behavioral:  Negative for depression and suicidal ideas. The patient does not have insomnia.       Allergies  Allergen Reactions   Hctz [Hydrochlorothiazide] Swelling   Ibuprofen Swelling     Past Medical History:  Diagnosis Date   Alcohol  use disorder    Aortic atherosclerosis (HCC)    Bronchiolo-alveolar adenocarcinoma of left lung (HCC)    CHF (congestive heart failure) (HCC)    Chronic hip pain    Chronic pancreatitis (HCC)    Cigarette smoker    CKD (chronic kidney disease) stage 3, GFR 30-59 ml/min (HCC)    COPD (chronic obstructive pulmonary disease) (HCC)    Crack cocaine use    as of 01-21-24, pt states he last used a few weeks ago   Depression    DM (diabetes mellitus), type 2 (HCC)    Dyspnea    ED (erectile dysfunction)    ESBL (extended spectrum beta-lactamase) producing bacteria infection 2021   GERD (gastroesophageal reflux disease)    Hyperlipidemia    Hypertension    ILD (interstitial lung disease) (HCC)    Marijuana use    Osteoarthritis    Pneumonia    PTSD (post-traumatic stress disorder)    Seizure (HCC)    x1 years ago   Viral infection of left eye      Past Surgical History:  Procedure Laterality Date   BRONCHOSCOPY, WITH BIOPSY USING ELECTROMAGNETIC NAVIGATION Left 01/24/2024   Procedure: BRONCHOSCOPY, WITH BIOPSY USING ELECTROMAGNETIC NAVIGATION;  Surgeon: Isaiah Scrivener, MD;  Location: [facility] ORS;  Service: Pulmonary;  Laterality: Left;    ENDOBRONCHIAL ULTRASOUND Right 01/24/2024   Procedure: ENDOBRONCHIAL ULTRASOUND (EBUS);  Surgeon: Kasa, Kurian, MD;  Location: [facility] ORS;  Service: Pulmonary;  Laterality: Right;   IR IMAGING GUIDED PORT INSERTION  03/15/2024   pancreatic stent placement      Social History   Socioeconomic History   Marital status: Legally Separated    Spouse name: Not on file   Number of children: 2   Years of education: Not on file   Highest education level: High school graduate  Occupational History   Occupation: on disability  Tobacco Use   Smoking status: Former    Current packs/day: 3.00    Average packs/day: 3.0 packs/day for 15.0 years (45.0 ttl pk-yrs)    Types: Cigarettes   Smokeless tobacco: Never   Tobacco comments:    At his heaviest smoked 3ppd and now occasionally smoke 1 cigarette. 02/01/24  Vaping Use   Vaping status: Some Days  Substance and Sexual Activity   Alcohol  use: Yes    Alcohol /week: 7.0 standard drinks of alcohol     Types: 7 Cans of beer per week    Comment: occ   Drug use: Yes    Types: Marijuana,  Crack cocaine    Comment: Marijuana every day. 7-71yrs clean of crack cocaine.   Sexual activity: Yes    Birth control/protection: Condom  Other Topics Concern   Not on file  Social History Narrative   Not on file   Social Drivers of Health   Financial Resource Strain: Not on file  Food Insecurity: No Food Insecurity (01/31/2024)   Hunger Vital Sign    Worried About Running Out of Food in the Last Year: Never true    Ran Out of Food in the Last Year: Never true  Recent Concern: Food Insecurity - Food Insecurity Present (01/15/2024)   Hunger Vital Sign    Worried About Running Out of Food in the Last Year: Sometimes true    Ran Out of Food in the Last Year: Sometimes true  Transportation Needs: No Transportation Needs (01/31/2024)   PRAPARE - Administrator, Civil Service (Medical): No    Lack of Transportation (Non-Medical): No  Physical Activity: Not  on file  Stress: Not on file  Social Connections: Not on file  Intimate Partner Violence: Not At Risk (01/31/2024)   Humiliation, Afraid, Rape, and Kick questionnaire    Fear of Current or Ex-Partner: No    Emotionally Abused: No    Physically Abused: No    Sexually Abused: No    Family History  Problem Relation Age of Onset   Diabetes Mother    Sleep apnea Mother    Emphysema Sister    CAD Neg Hx    Mental illness Neg Hx      Current Outpatient Medications:    Accu-Chek Softclix Lancets lancets, Use with blood glucose monitor in the morning, at noon, and at bedtime. May substitute to any manufacturer covered by patient's insurance., Disp: 100 each, Rfl: 0   amLODipine -olmesartan  (AZOR ) 10-40 MG tablet, Take 1 tablet by mouth daily., Disp: 90 tablet, Rfl: 1   Blood Glucose Monitoring Suppl (BLOOD GLUCOSE MONITOR SYSTEM) w/Device KIT, Check blood sugar in the morning, at noon, and at bedtime. May substitute to any manufacturer covered by patient's insurance., Disp: 1 kit, Rfl: 0   dexamethasone  (DECADRON ) 4 MG tablet, Take 2 tablets (8 mg total) by mouth daily. Take for 1 day starting the day after chemotherapy on day 4. Take with food., Disp: 30 tablet, Rfl: 1   empagliflozin  (JARDIANCE ) 10 MG TABS tablet, Take 1 tablet (10 mg total) by mouth daily before breakfast., Disp: 90 tablet, Rfl: 1   ipratropium-albuterol  (DUONEB) 0.5-2.5 (3) MG/3ML SOLN, Take 3 mLs by nebulization every 2 (two) hours as needed (wheeze, SOB). Can use instead of albuterol  inhaler, Disp: 60 mL, Rfl: 1   lidocaine -prilocaine  (EMLA ) cream, Apply to affected area once, Disp: 30 g, Rfl: 3   lovastatin  (MEVACOR ) 10 MG tablet, Take 1 tablet (10 mg total) by mouth at bedtime., Disp: 90 tablet, Rfl: 1   metFORMIN  (GLUCOPHAGE ) 500 MG tablet, Take 1 tablet (500 mg total) by mouth daily with breakfast., Disp: 90 tablet, Rfl: 1   naproxen  (NAPROSYN ) 250 MG tablet, Take 250 mg by mouth 2 (two) times daily with a meal., Disp:  , Rfl:    Nebulizers (COMPRESSOR/NEBULIZER) MISC, Nebulizer and tubes/mask to use as directed with DuoNeb as needed, Disp: 1 each, Rfl: 0   nicotine  (NICODERM CQ  - DOSED IN MG/24 HOURS) 21 mg/24hr patch, Place 1 patch (21 mg total) onto the skin daily., Disp: 28 patch, Rfl: 0   omeprazole  (PRILOSEC) 20 MG capsule, Take 1 capsule (  20 mg total) by mouth daily., Disp: 90 capsule, Rfl: 1   ondansetron  (ZOFRAN ) 8 MG tablet, Take 1 tablet (8 mg total) by mouth every 8 (eight) hours as needed for nausea or vomiting. Start on the third day after cisplatin ., Disp: 30 tablet, Rfl: 1   OXYGEN , Inhale 2 L into the lungs as needed., Disp: , Rfl:    Pancrelipase , Lip-Prot-Amyl, (ZENPEP ) 15000-47000 units CPEP, Take 1 capsule (15,000 Units total) by mouth 3 (three) times daily., Disp: 270 capsule, Rfl: 1   prochlorperazine  (COMPAZINE ) 10 MG tablet, Take 1 tablet (10 mg total) by mouth every 6 (six) hours as needed for nausea or vomiting., Disp: 30 tablet, Rfl: 1   sertraline  (ZOLOFT ) 100 MG tablet, Take 1 tablet (100 mg total) by mouth daily., Disp: 90 tablet, Rfl: 0   spironolactone  (ALDACTONE ) 25 MG tablet, Take 1 tablet (25 mg total) by mouth daily., Disp: 90 tablet, Rfl: 3   traZODone  (DESYREL ) 150 MG tablet, Take 1 tablet (150 mg total) by mouth at bedtime as needed for sleep., Disp: 90 tablet, Rfl: 1   VENTOLIN  HFA 108 (90 Base) MCG/ACT inhaler, Inhale 1-2 puffs into the lungs every 6 (six) hours as needed for wheezing or shortness of breath (or coughing)., Disp: 18 g, Rfl: 6 No current facility-administered medications for this visit.  Facility-Administered Medications Ordered in Other Visits:    0.9 %  sodium chloride  infusion, , Intravenous, Continuous, [name], MD, Last Rate: 10 mL/hr at 03/28/24 0857, New Bag at 03/28/24 0857   aprepitant  (CINVANTI ) injection 130 mg, 130 mg, Intravenous, Once, [name], MD   CISplatin  (PLATINOL ) 113 mg in sodium chloride  0.9 % 500 mL chemo infusion, 60  mg/m2 (Treatment Plan Recorded), Intravenous, Once, [name], MD   dexamethasone  (DECADRON ) injection 10 mg, 10 mg, Intravenous, Once, [name], MD   etoposide  (VEPESID ) 200 mg in sodium chloride  0.9 % 500 mL chemo infusion, 100 mg/m2 (Treatment Plan Recorded), Intravenous, Once, [name], MD   magnesium  sulfate IVPB 2 g 50 mL, 2 g, Intravenous, Once, [name], MD, Last Rate: 50 mL/hr at 03/28/24 0901, 2 g at 03/28/24 0901   palonosetron  (ALOXI ) injection 0.25 mg, 0.25 mg, Intravenous, Once, [name], MD  Physical exam:  Vitals:   03/28/24 0823  BP: (!) 137/90  Pulse: 84  Resp: 19  Temp: 99 F (37.2 C)  TempSrc: Tympanic  SpO2: 96%  Weight: 165 lb 2 oz (74.9 kg)  Height: 5' 7.5 (1.715 m)   Physical Exam Cardiovascular:     Rate and Rhythm: Normal rate and regular rhythm.     Heart sounds: Normal heart sounds.  Pulmonary:     Effort: Pulmonary effort is normal.     Breath sounds: Normal breath sounds.  Skin:    General: Skin is warm and dry.  Neurological:     Mental Status: He is alert and oriented to person, place, and time.      I have personally reviewed labs listed below:    Latest Ref Rng & Units 03/28/2024    8:23 AM  CMP  Glucose 70 - 99 mg/dL 845   BUN 6 - 20 mg/dL 10   Creatinine 9.38 - 1.24 mg/dL 8.81   Sodium 864 - 854 mmol/L 132   Potassium 3.5 - 5.1 mmol/L 4.0   Chloride 98 - 111 mmol/L 101   CO2 22 - 32 mmol/L 24   Calcium  8.9 - 10.3 mg/dL 8.9  Total Protein 6.5 - 8.1 g/dL 8.0   Total Bilirubin 0.0 - 1.2 mg/dL 0.3   Alkaline Phos 38 - 126 U/L 97   AST 15 - 41 U/L 23   ALT 0 - 44 U/L 12       Latest Ref Rng & Units 03/28/2024    8:23 AM  CBC  WBC 4.0 - 10.5 K/uL 6.4   Hemoglobin 13.0 - 17.0 g/dL 87.1   Hematocrit 60.9 - 52.0 % 37.4   Platelets 150 - 400 K/uL 260    I have personally reviewed Radiology images listed below: No images are attached to the encounter.  IR IMAGING GUIDED PORT INSERTION Result Date:  03/15/2024 CLINICAL DATA:  Lung cancer, access for chemotherapy EXAM: RIGHT INTERNAL JUGULAR SINGLE LUMEN POWER PORT CATHETER INSERTION Date:  03/15/2024 03/15/2024 1:45 pm Radiologist:  CHRISTELLA. Frederic Specking, MD Guidance:  Ultrasound and fluoroscopic MEDICATIONS: 1% lidocaine  local with epinephrine  ANESTHESIA/SEDATION: Versed  2.0 mg IV; Fentanyl  100 mcg IV; Moderate Sedation Time:  20 minute The patient was continuously monitored during the procedure by the interventional radiology nurse under my direct supervision. FLUOROSCOPY: 0 minutes, 36 seconds (4 mGy) COMPLICATIONS: None immediate. CONTRAST:  None. PROCEDURE: Informed consent was obtained from the patient following explanation of the procedure, risks, benefits and alternatives. The patient understands, agrees and consents for the procedure. All questions were addressed. A time out was performed. Maximal barrier sterile technique utilized including caps, mask, sterile gowns, sterile gloves, large sterile drape, hand hygiene, and 2% chlorhexidine  scrub. Under sterile conditions and local anesthesia, right internal jugular micropuncture venous access was performed. Access was performed with ultrasound. Images were obtained for documentation of the patent right internal jugular vein. A guide wire was inserted followed by a transitional dilator. This allowed insertion of a guide wire and catheter into the IVC. Measurements were obtained from the SVC / RA junction back to the right IJ venotomy site. In the right infraclavicular chest, a subcutaneous pocket was created over the second anterior rib. This was done under sterile conditions and local anesthesia. 1% lidocaine  with epinephrine  was utilized for this. A 2.5 cm incision was made in the skin. Blunt dissection was performed to create a subcutaneous pocket over the right pectoralis major muscle. The pocket was flushed with saline vigorously. There was adequate hemostasis. The port catheter was assembled and checked  for leakage. The port catheter was secured in the pocket with two retention sutures. The tubing was tunneled subcutaneously to the right venotomy site and inserted into the SVC/RA junction through a valved peel-away sheath. Position was confirmed with fluoroscopy. Images were obtained for documentation. The patient tolerated the procedure well. No immediate complications. Incisions were closed in a two layer fashion with 4 - 0 Vicryl suture. Dermabond was applied to the skin. The port catheter was accessed, blood was aspirated followed by saline and heparin  flushes. Needle was removed. A dry sterile dressing was applied. IMPRESSION: Ultrasound and fluoroscopically guided right internal jugular single lumen power port catheter insertion. Tip in the SVC/RA junction. Catheter ready for use. Electronically Signed   By: CHRISTELLA.  Shick M.D.   On: 03/15/2024 13:55   MR HUMERUS LEFT W WO CONTRAST Result Date: 03/02/2024 CLINICAL DATA:  Metastatic small cell lung cancer, suspicion for left humeral involvement based on PET scan EXAM: MRI OF THE LEFT HUMERUS WITHOUT AND WITH CONTRAST TECHNIQUE: Multiplanar, multisequence MR imaging of the left humerus was performed before and after the administration of intravenous contrast. CONTRAST:  7.5mL GADAVIST   GADOBUTROL  1 MMOL/ML IV SOLN COMPARISON:  PET-CT 02/16/2024 FINDINGS: Bones/Joint/Cartilage MRI confirms a 6.5 by 1.6 by 1.5 cm primarily intramedullary enhancing lesion compatible with metastatic disease in the diaphysis of the mid to proximal left humerus. This has high T2 low T1 signal characteristics and there is some mild medial periostitis along the humerus although no obvious or well-defined cortical breakthrough or extraosseous extension of tumor. The lesion enhances with some central hypoenhancement and corresponds to the hypermetabolic lesion at PET-CT. Small degenerative lesions are identified along the humeral head. Ligaments N/A Muscles and Tendons Unremarkable Soft  tissues A left upper lobe mass is observed on image 36 series 14, previously hypermetabolic on PET-CT. This abuts the pleural margin without well-defined chest wall invasion. IMPRESSION: 1. MRI confirms a 6.5 by 1.6 by 1.5 cm primarily intramedullary enhancing lesion in the diaphysis of the mid to proximal left humerus, compatible with metastatic disease. There is some mild medial periostitis along the humerus although no obvious or well-defined cortical breakthrough or extraosseous extension of tumor. 2. Left upper lobe mass, previously hypermetabolic on PET-CT. 3. Small degenerative lesions along the humeral head. Electronically Signed   By: Ryan Salvage M.D.   On: 03/02/2024 16:01     Assessment and plan- Patient is a 58 y.o. male with history of newly diagnosed limited stage small cell lung cancer and possible left humeral metastases.  He is here for on treatment assessmentPrior to cycle 2 of cisplatin  etoposide  chemotherapy  Neutropenia has resolved after Neupogen  and counts otherwise okay to proceed with cycle 2 of cisplatin  etoposide  chemotherapy today and etoposide  tomorrow and day after.  He is receiving concurrent radiation and therefore unable to receive Neulasta.  He will decide if he needs Neupogen  based on his counts.  He will be seen in 10 days with labs and for possible fluids.  I will see him back in 3 weeks from today for cycle 3 of treatment.  Plan to repeat scans after 4 cycles.  There is also concern for possible left humeral metastases which could not be biopsy-proven.  We are still treating him as such for limited stage small cell lung cancer and he will be receiving palliative radiation to his left humerus     Visit Diagnosis 1. Encounter for antineoplastic chemotherapy   2. Small cell lung cancer, left upper lobe (HCC)      Dr. Annah Skene, MD, MPH [facility] at [facility] [phone] 03/28/2024 9:18 AM

## 2024-03-28 NOTE — Progress Notes (Signed)
 Patient is requesting tylenol  for right hip pain while he is getting treatment today. Other than that, no new or acute concerns today.

## 2024-03-28 NOTE — Telephone Encounter (Signed)
 Patient did not show for his Heart Failure Clinic appointment on 03/28/24

## 2024-03-28 NOTE — Patient Instructions (Signed)
 [facility] - A DEPT OF [facility]  Discharge Instructions: Thank you for choosing [facility] to provide your oncology and hematology care.  If you have a lab appointment with the Cancer Center, please go directly to the Cancer Center and check in at the registration area.  Wear comfortable clothing and clothing appropriate for easy access to any Portacath or PICC line.   We strive to give you quality time with your provider. You may need to reschedule your appointment if you arrive late (15 or more minutes).  Arriving late affects you and other patients whose appointments are after yours.  Also, if you miss three or more appointments without notifying the office, you may be dismissed from the clinic at the provider's discretion.      For prescription refill requests, have your pharmacy contact our office and allow 72 hours for refills to be completed.    Today you received the following chemotherapy and/or immunotherapy agents Cisplatin & Etoposide      To help prevent nausea and vomiting after your treatment, we encourage you to take your nausea medication as directed.  BELOW ARE SYMPTOMS THAT SHOULD BE REPORTED IMMEDIATELY: *FEVER GREATER THAN 100.4 F (38 C) OR HIGHER *CHILLS OR SWEATING *NAUSEA AND VOMITING THAT IS NOT CONTROLLED WITH YOUR NAUSEA MEDICATION *UNUSUAL SHORTNESS OF BREATH *UNUSUAL BRUISING OR BLEEDING *URINARY PROBLEMS (pain or burning when urinating, or frequent urination) *BOWEL PROBLEMS (unusual diarrhea, constipation, pain near the anus) TENDERNESS IN MOUTH AND THROAT WITH OR WITHOUT PRESENCE OF ULCERS (sore throat, sores in mouth, or a toothache) UNUSUAL RASH, SWELLING OR PAIN  UNUSUAL VAGINAL DISCHARGE OR ITCHING   Items with * indicate a potential emergency and should be followed up as soon as possible or go to the Emergency Department if any problems should occur.  Please show the CHEMOTHERAPY ALERT CARD or  IMMUNOTHERAPY ALERT CARD at check-in to the Emergency Department and triage nurse.  Should you have questions after your visit or need to cancel or reschedule your appointment, please contact [facility] - A DEPT OF [facility]  [phone] and follow the prompts.  Office hours are 8:00 a.m. to 4:30 p.m. Monday - Friday. Please note that voicemails left after 4:00 p.m. may not be returned until the following business day.  We are closed weekends and major holidays. You have access to a nurse at all times for urgent questions. Please call the main number to the clinic [phone] and follow the prompts.  For any non-urgent questions, you may also contact your provider using MyChart. We now offer e-Visits for anyone 46 and older to request care online for non-urgent symptoms. For details visit [email or web address].   Also download the MyChart app! Go to the app store, search "MyChart", open the app, select , and log in with your MyChart username and password.

## 2024-03-29 ENCOUNTER — Other Ambulatory Visit: Payer: Self-pay

## 2024-03-29 ENCOUNTER — Ambulatory Visit
Admission: RE | Admit: 2024-03-29 | Discharge: 2024-03-29 | Disposition: A | Discharge status: Home / Self Care | Source: Ambulatory Visit | Attending: Radiation Oncology | Admitting: Radiation Oncology

## 2024-03-29 ENCOUNTER — Inpatient Hospital Stay

## 2024-03-29 VITALS — BP 135/86 | HR 81 | Temp 97.2°F | Resp 18

## 2024-03-29 DIAGNOSIS — C3412 Malignant neoplasm of upper lobe, left bronchus or lung: Secondary | ICD-10-CM | POA: Diagnosis not present

## 2024-03-29 DIAGNOSIS — Z5111 Encounter for antineoplastic chemotherapy: Secondary | ICD-10-CM | POA: Diagnosis not present

## 2024-03-29 DIAGNOSIS — Z87891 Personal history of nicotine dependence: Secondary | ICD-10-CM | POA: Diagnosis not present

## 2024-03-29 DIAGNOSIS — Z510 Encounter for antineoplastic radiation therapy: Secondary | ICD-10-CM | POA: Diagnosis not present

## 2024-03-29 LAB — RAD ONC ARIA SESSION SUMMARY
Course Elapsed Days: 8
Plan Fractions Treated to Date: 7
Plan Prescribed Dose Per Fraction: 2 Gy
Plan Total Fractions Prescribed: 30
Plan Total Prescribed Dose: 60 Gy
Reference Point Dosage Given to Date: 14 Gy
Reference Point Session Dosage Given: 2 Gy
Session Number: 7

## 2024-03-29 MED ORDER — SODIUM CHLORIDE 0.9 % IV SOLN
100.0000 mg/m2 | Freq: Once | INTRAVENOUS | Status: AC
  Administered 2024-03-29 (×2): 200 mg via INTRAVENOUS
  Filled 2024-03-29: qty 10

## 2024-03-29 MED ORDER — SODIUM CHLORIDE 0.9 % IV SOLN
INTRAVENOUS | Status: DC
  Filled 2024-03-29: qty 250

## 2024-03-29 MED ORDER — DEXAMETHASONE SODIUM PHOSPHATE 10 MG/ML IJ SOLN
10.0000 mg | Freq: Once | INTRAMUSCULAR | Status: AC
  Administered 2024-03-29 (×2): 10 mg via INTRAVENOUS
  Filled 2024-03-29: qty 1

## 2024-03-30 ENCOUNTER — Other Ambulatory Visit: Payer: Self-pay

## 2024-03-30 ENCOUNTER — Encounter: Admitting: Hospice and Palliative Medicine

## 2024-03-30 ENCOUNTER — Other Ambulatory Visit

## 2024-03-30 ENCOUNTER — Inpatient Hospital Stay

## 2024-03-30 ENCOUNTER — Ambulatory Visit

## 2024-03-30 ENCOUNTER — Ambulatory Visit
Admission: RE | Admit: 2024-03-30 | Discharge: 2024-03-30 | Disposition: A | Discharge status: Home / Self Care | Source: Ambulatory Visit | Attending: Radiation Oncology | Admitting: Radiation Oncology

## 2024-03-30 ENCOUNTER — Telehealth: Payer: Self-pay | Admitting: Family

## 2024-03-30 VITALS — BP 129/83 | HR 82 | Temp 97.4°F | Resp 18

## 2024-03-30 DIAGNOSIS — Z5111 Encounter for antineoplastic chemotherapy: Secondary | ICD-10-CM | POA: Diagnosis not present

## 2024-03-30 DIAGNOSIS — C3412 Malignant neoplasm of upper lobe, left bronchus or lung: Secondary | ICD-10-CM | POA: Diagnosis not present

## 2024-03-30 DIAGNOSIS — Z510 Encounter for antineoplastic radiation therapy: Secondary | ICD-10-CM | POA: Diagnosis not present

## 2024-03-30 DIAGNOSIS — Z87891 Personal history of nicotine dependence: Secondary | ICD-10-CM | POA: Diagnosis not present

## 2024-03-30 LAB — RAD ONC ARIA SESSION SUMMARY
Course Elapsed Days: 9
Plan Fractions Treated to Date: 8
Plan Prescribed Dose Per Fraction: 2 Gy
Plan Total Fractions Prescribed: 30
Plan Total Prescribed Dose: 60 Gy
Reference Point Dosage Given to Date: 16 Gy
Reference Point Session Dosage Given: 2 Gy
Session Number: 8

## 2024-03-30 MED ORDER — SODIUM CHLORIDE 0.9 % IV SOLN
INTRAVENOUS | Status: DC
  Filled 2024-03-30: qty 250

## 2024-03-30 MED ORDER — DEXAMETHASONE SODIUM PHOSPHATE 10 MG/ML IJ SOLN
10.0000 mg | Freq: Once | INTRAMUSCULAR | Status: AC
  Administered 2024-03-30: 10 mg via INTRAVENOUS
  Filled 2024-03-30: qty 1

## 2024-03-30 MED ORDER — SODIUM CHLORIDE 0.9 % IV SOLN
100.0000 mg/m2 | Freq: Once | INTRAVENOUS | Status: AC
  Administered 2024-03-30: 200 mg via INTRAVENOUS
  Filled 2024-03-30: qty 10

## 2024-03-30 NOTE — Progress Notes (Signed)
 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 Was in the [employer] during [place].   Admitted 01/14/24 with wheezing and shortness of breath. On admission, BNP was 317.8, HS-troponin was 26 > 23, UDS positive for cocaine, and TSH was 1.165. Chest x-ray noted pulmonary vascular congestion without airspace edema. 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 Echocardiogram 01/15/24 noted LVEFof 50-55%, mild LVH, grade I diastolic dysfunction. Initially needed oxygen  at 2L and unable to be weaned off of it. Brain MRI negative. Bronchoscopy and biopsy to be done as outpatient.   Seen in [facility] 06/25 where jardiance  was started.   Seen in [facility] 07/25 where spironolactone  25mg  daily was started.   He presents today for a HF follow-up visit with a chief complaint of fatigue. Has associated rare palpitations, heartburn, intermittent chest pain since receiving radiation, occasional dizziness. Sleeping well on 1 pillow. Wearing oxygen  at 2L at bedtime. Says that he's tolerating radiation/ chemo without much difficulty at the moment and plans to become a hospital volunteer once he beats this.   Denies tobacco use. Drinking 1-2 beers every week (was drinking 80oz), marijuana use   ROS: All systems negative except what is listed in HPI, PMH and Problem List   Past Medical History:  Diagnosis Date   Alcohol  use disorder    Aortic atherosclerosis (HCC)    Bronchiolo-alveolar adenocarcinoma of left lung (HCC)    CHF (congestive heart failure) (HCC)    Chronic hip pain    Chronic pancreatitis (HCC)    Cigarette smoker    CKD (chronic kidney disease)  stage 3, GFR 30-59 ml/min (HCC)    COPD (chronic obstructive pulmonary disease) (HCC)    Crack cocaine use    as of 01-21-24, pt states he last used a few weeks ago   Depression    DM (diabetes mellitus), type 2 (HCC)    Dyspnea    ED (erectile dysfunction)    ESBL (extended spectrum beta-lactamase) producing bacteria infection 2021   GERD (gastroesophageal reflux disease)    Hyperlipidemia    Hypertension    ILD (interstitial lung disease) (HCC)    Marijuana use    Osteoarthritis    Pneumonia    PTSD (post-traumatic stress disorder)    Seizure (HCC)    x1 years ago   Viral infection of left eye     Current Outpatient Medications  Medication Sig Dispense Refill   Accu-Chek Softclix Lancets lancets Use with blood glucose monitor in the morning, at noon, and at bedtime. May substitute to any manufacturer covered by patient's insurance. 100 each 0   amLODipine -olmesartan  (AZOR ) 10-40 MG tablet Take 1 tablet by mouth daily. 90 tablet 1   Blood Glucose Monitoring Suppl (BLOOD GLUCOSE MONITOR SYSTEM) w/Device KIT Check blood sugar in the morning, at noon, and at bedtime. May substitute to any manufacturer covered by patient's insurance. 1 kit 0   dexamethasone  (DECADRON ) 4 MG tablet Take 2 tablets (8 mg total) by mouth daily. Take for 1 day starting the day after chemotherapy on day 4. Take with food. 30 tablet 1   empagliflozin  (JARDIANCE ) 10 MG TABS tablet Take 1 tablet (10 mg total) by mouth  daily before breakfast. 90 tablet 1   ipratropium-albuterol  (DUONEB) 0.5-2.5 (3) MG/3ML SOLN Take 3 mLs by nebulization every 2 (two) hours as needed (wheeze, SOB). Can use instead of albuterol  inhaler 60 mL 1   lidocaine -prilocaine  (EMLA ) cream Apply to affected area once 30 g 3   lovastatin  (MEVACOR ) 10 MG tablet Take 1 tablet (10 mg total) by mouth at bedtime. 90 tablet 1   metFORMIN  (GLUCOPHAGE ) 500 MG tablet Take 1 tablet (500 mg total) by mouth daily with breakfast. 90 tablet 1   naproxen   (NAPROSYN ) 250 MG tablet Take 250 mg by mouth 2 (two) times daily with a meal.     Nebulizers (COMPRESSOR/NEBULIZER) MISC Nebulizer and tubes/mask to use as directed with DuoNeb as needed 1 each 0   nicotine  (NICODERM CQ  - DOSED IN MG/24 HOURS) 21 mg/24hr patch Place 1 patch (21 mg total) onto the skin daily. 28 patch 0   omeprazole  (PRILOSEC) 20 MG capsule Take 1 capsule (20 mg total) by mouth daily. 90 capsule 1   ondansetron  (ZOFRAN ) 8 MG tablet Take 1 tablet (8 mg total) by mouth every 8 (eight) hours as needed for nausea or vomiting. Start on the third day after cisplatin . 30 tablet 1   OXYGEN  Inhale 2 L into the lungs as needed.     Pancrelipase , Lip-Prot-Amyl, (ZENPEP ) 15000-47000 units CPEP Take 1 capsule (15,000 Units total) by mouth 3 (three) times daily. 270 capsule 1   prochlorperazine  (COMPAZINE ) 10 MG tablet Take 1 tablet (10 mg total) by mouth every 6 (six) hours as needed for nausea or vomiting. 30 tablet 1   sertraline  (ZOLOFT ) 100 MG tablet Take 1 tablet (100 mg total) by mouth daily. 90 tablet 0   spironolactone  (ALDACTONE ) 25 MG tablet Take 1 tablet (25 mg total) by mouth daily. 90 tablet 3   traZODone  (DESYREL ) 150 MG tablet Take 1 tablet (150 mg total) by mouth at bedtime as needed for sleep. 90 tablet 1   VENTOLIN  HFA 108 (90 Base) MCG/ACT inhaler Inhale 1-2 puffs into the lungs every 6 (six) hours as needed for wheezing or shortness of breath (or coughing). 18 g 6   No current facility-administered medications for this visit.   Facility-Administered Medications Ordered in Other Visits  Medication Dose Route Frequency Provider Last Rate Last Admin   0.9 %  sodium chloride  infusion   Intravenous Continuous [name], MD   Stopped at 03/30/24 1413    Allergies  Allergen Reactions   Hctz [Hydrochlorothiazide] Swelling   Ibuprofen Swelling      Social History   Socioeconomic History   Marital status: Legally Separated    Spouse name: Not on file   Number of  children: 2   Years of education: Not on file   Highest education level: High school graduate  Occupational History   Occupation: on disability  Tobacco Use   Smoking status: Former    Current packs/day: 3.00    Average packs/day: 3.0 packs/day for 15.0 years (45.0 ttl pk-yrs)    Types: Cigarettes   Smokeless tobacco: Never   Tobacco comments:    At his heaviest smoked 3ppd and now occasionally smoke 1 cigarette. 02/01/24  Vaping Use   Vaping status: Some Days  Substance and Sexual Activity   Alcohol  use: Yes    Alcohol /week: 7.0 standard drinks of alcohol     Types: 7 Cans of beer per week    Comment: occ   Drug use: Yes    Types: Marijuana, Crack  cocaine    Comment: Marijuana every day. 7-38yrs clean of crack cocaine.   Sexual activity: Yes    Birth control/protection: Condom  Other Topics Concern   Not on file  Social History Narrative   Not on file   Social Drivers of Health   Financial Resource Strain: Not on file  Food Insecurity: No Food Insecurity (01/31/2024)   Hunger Vital Sign    Worried About Running Out of Food in the Last Year: Never true    Ran Out of Food in the Last Year: Never true  Recent Concern: Food Insecurity - Food Insecurity Present (01/15/2024)   Hunger Vital Sign    Worried About Running Out of Food in the Last Year: Sometimes true    Ran Out of Food in the Last Year: Sometimes true  Transportation Needs: No Transportation Needs (01/31/2024)   PRAPARE - Administrator, Civil Service (Medical): No    Lack of Transportation (Non-Medical): No  Physical Activity: Not on file  Stress: Not on file  Social Connections: Not on file  Intimate Partner Violence: Not At Risk (01/31/2024)   Humiliation, Afraid, Rape, and Kick questionnaire    Fear of Current or Ex-Partner: No    Emotionally Abused: No    Physically Abused: No    Sexually Abused: No      Family History  Problem Relation Age of Onset   Diabetes Mother    Sleep apnea Mother     Emphysema Sister    CAD Neg Hx    Mental illness Neg Hx    Vitals:   03/31/24 1520  BP: 114/76  Pulse: 82  SpO2: 96%  Weight: 167 lb 12.8 oz (76.1 kg)  Height: 5' 7.5 (1.715 m)   Wt Readings from Last 3 Encounters:  03/31/24 167 lb 12.8 oz (76.1 kg)  03/28/24 165 lb 2 oz (74.9 kg)  03/21/24 166 lb (75.3 kg)   Lab Results  Component Value Date   CREATININE 1.18 03/28/2024   CREATININE 1.05 03/21/2024   CREATININE 1.00 02/29/2024    PHYSICAL EXAM:  General: Well appearing male in wheelchair.  Cor: No JVD. Regular rhythm, rate.  Lungs: clear Abdomen: soft, nontender, nondistended. Extremities: no edema Neuro:. Affect pleasant   ECG: not done   ASSESSMENT & PLAN:  1: NICM with preserved ejection fraction- - suspect due to HTN - NYHA class II - euvolemic - weight stable from last visit here 1 month ago - Echo 01/15/24: LVEFof 50-55%, mild LVH, G1DD. Discussed updating echo after chemo/ radiation finishes to monitor heart function.  - continue amlodipine -olmesartan  10/40mg  daily  - continue jardiance  10mg  daily - continue spironolactone  25mg  daily - BNP 01/15/24 was 317.8  2: HTN- - BP 114/76 - saw PCP Georgina) - BMET 03/28/24 reviewed: sodium 132, potassium 4.0, creatinine 1.18 & GFR >60  3: Lung cancer left upper lobe, stage IIIB- - new diagnosis 05/25 - had bronchoscopy/ biopsy done 01/24/24 - saw pulmonology (Kasa) 06/25 - PET scan done 02/16/24 - MRI of his left humerus 03/02/24: showed a 6 cm mass in the intramedullary region in the diaphysis of mid to proximal left humerus which was concerning for metastatic disease. Still being treated with the potential curative intent with a combination chemoradiation followed by likely palliative radiation to left humerus lesion  - saw oncology Linnea) 08/25 - has currently received 2/ 4 cisplatin  chemotherapy treatments - currently undergoing radiation treatment  4: DM- - A1c 12/07/23 was 6.5%  5: Substance use- -  no tobacco use - drinking 1-2 cans of beer weekly (was drinking 80 oz every other day) - + marijuana - cessation discussed   6: Hyperlipidemia- - continue lovastatin  10mg  daily - LD 12/07/23 was 59   Return in 3 months to see HF MD, sooner if needed.    Ellouise DELENA Class, FNP 03/30/24

## 2024-03-30 NOTE — Telephone Encounter (Signed)
 Called to confirm/remind patient of their appointment at the [facility] on 03/31/24.   Appointment:   [x] Confirmed  [] Left mess   [] No answer/No voice mail  [] VM Full/unable to leave message  [] Phone not in service  Patient reminded to bring all medications and/or complete list.  Confirmed patient has transportation. Gave directions, instructed to utilize valet parking.

## 2024-03-31 ENCOUNTER — Ambulatory Visit: Attending: Family | Admitting: Family

## 2024-03-31 ENCOUNTER — Encounter: Payer: Self-pay | Admitting: Family

## 2024-03-31 ENCOUNTER — Other Ambulatory Visit: Payer: Self-pay

## 2024-03-31 ENCOUNTER — Ambulatory Visit
Admission: RE | Admit: 2024-03-31 | Discharge: 2024-03-31 | Disposition: A | Discharge status: Home / Self Care | Source: Ambulatory Visit | Attending: Radiation Oncology | Admitting: Radiation Oncology

## 2024-03-31 VITALS — BP 114/76 | HR 82 | Ht 67.5 in | Wt 167.8 lb

## 2024-03-31 DIAGNOSIS — N183 Chronic kidney disease, stage 3 unspecified: Secondary | ICD-10-CM | POA: Insufficient documentation

## 2024-03-31 DIAGNOSIS — Z79899 Other long term (current) drug therapy: Secondary | ICD-10-CM | POA: Diagnosis not present

## 2024-03-31 DIAGNOSIS — Z9981 Dependence on supplemental oxygen: Secondary | ICD-10-CM | POA: Diagnosis not present

## 2024-03-31 DIAGNOSIS — Z85118 Personal history of other malignant neoplasm of bronchus and lung: Secondary | ICD-10-CM | POA: Diagnosis not present

## 2024-03-31 DIAGNOSIS — I130 Hypertensive heart and chronic kidney disease with heart failure and stage 1 through stage 4 chronic kidney disease, or unspecified chronic kidney disease: Secondary | ICD-10-CM | POA: Diagnosis not present

## 2024-03-31 DIAGNOSIS — C3492 Malignant neoplasm of unspecified part of left bronchus or lung: Secondary | ICD-10-CM

## 2024-03-31 DIAGNOSIS — I10 Essential (primary) hypertension: Secondary | ICD-10-CM | POA: Diagnosis not present

## 2024-03-31 DIAGNOSIS — Z7984 Long term (current) use of oral hypoglycemic drugs: Secondary | ICD-10-CM | POA: Diagnosis not present

## 2024-03-31 DIAGNOSIS — E785 Hyperlipidemia, unspecified: Secondary | ICD-10-CM | POA: Insufficient documentation

## 2024-03-31 DIAGNOSIS — I428 Other cardiomyopathies: Secondary | ICD-10-CM | POA: Diagnosis not present

## 2024-03-31 DIAGNOSIS — F17290 Nicotine dependence, other tobacco product, uncomplicated: Secondary | ICD-10-CM | POA: Diagnosis not present

## 2024-03-31 DIAGNOSIS — E1169 Type 2 diabetes mellitus with other specified complication: Secondary | ICD-10-CM

## 2024-03-31 DIAGNOSIS — Z716 Tobacco abuse counseling: Secondary | ICD-10-CM | POA: Diagnosis not present

## 2024-03-31 DIAGNOSIS — J449 Chronic obstructive pulmonary disease, unspecified: Secondary | ICD-10-CM | POA: Diagnosis not present

## 2024-03-31 DIAGNOSIS — I5032 Chronic diastolic (congestive) heart failure: Secondary | ICD-10-CM | POA: Diagnosis not present

## 2024-03-31 DIAGNOSIS — E119 Type 2 diabetes mellitus without complications: Secondary | ICD-10-CM | POA: Diagnosis not present

## 2024-03-31 DIAGNOSIS — Z87891 Personal history of nicotine dependence: Secondary | ICD-10-CM | POA: Diagnosis not present

## 2024-03-31 DIAGNOSIS — E1122 Type 2 diabetes mellitus with diabetic chronic kidney disease: Secondary | ICD-10-CM | POA: Diagnosis not present

## 2024-03-31 DIAGNOSIS — Z5111 Encounter for antineoplastic chemotherapy: Secondary | ICD-10-CM | POA: Diagnosis not present

## 2024-03-31 DIAGNOSIS — Z510 Encounter for antineoplastic radiation therapy: Secondary | ICD-10-CM | POA: Diagnosis not present

## 2024-03-31 DIAGNOSIS — I509 Heart failure, unspecified: Secondary | ICD-10-CM | POA: Insufficient documentation

## 2024-03-31 DIAGNOSIS — C3412 Malignant neoplasm of upper lobe, left bronchus or lung: Secondary | ICD-10-CM | POA: Diagnosis not present

## 2024-03-31 DIAGNOSIS — F1990 Other psychoactive substance use, unspecified, uncomplicated: Secondary | ICD-10-CM

## 2024-03-31 LAB — RAD ONC ARIA SESSION SUMMARY
Course Elapsed Days: 10
Plan Fractions Treated to Date: 9
Plan Prescribed Dose Per Fraction: 2 Gy
Plan Total Fractions Prescribed: 30
Plan Total Prescribed Dose: 60 Gy
Reference Point Dosage Given to Date: 18 Gy
Reference Point Session Dosage Given: 2 Gy
Session Number: 9

## 2024-03-31 NOTE — Patient Instructions (Signed)
 It was good to see you today!

## 2024-04-03 ENCOUNTER — Telehealth: Payer: Self-pay | Admitting: Pharmacy Technician

## 2024-04-03 ENCOUNTER — Ambulatory Visit
Admission: RE | Admit: 2024-04-03 | Discharge: 2024-04-03 | Disposition: A | Discharge status: Home / Self Care | Source: Ambulatory Visit | Attending: Radiation Oncology | Admitting: Radiation Oncology

## 2024-04-03 ENCOUNTER — Other Ambulatory Visit: Payer: Self-pay

## 2024-04-03 DIAGNOSIS — Z510 Encounter for antineoplastic radiation therapy: Secondary | ICD-10-CM | POA: Diagnosis not present

## 2024-04-03 DIAGNOSIS — C3412 Malignant neoplasm of upper lobe, left bronchus or lung: Secondary | ICD-10-CM | POA: Diagnosis not present

## 2024-04-03 DIAGNOSIS — Z87891 Personal history of nicotine dependence: Secondary | ICD-10-CM | POA: Diagnosis not present

## 2024-04-03 DIAGNOSIS — Z5111 Encounter for antineoplastic chemotherapy: Secondary | ICD-10-CM | POA: Diagnosis not present

## 2024-04-03 LAB — RAD ONC ARIA SESSION SUMMARY
Course Elapsed Days: 13
Plan Fractions Treated to Date: 10
Plan Prescribed Dose Per Fraction: 2 Gy
Plan Total Fractions Prescribed: 30
Plan Total Prescribed Dose: 60 Gy
Reference Point Dosage Given to Date: 20 Gy
Reference Point Session Dosage Given: 2 Gy
Session Number: 10

## 2024-04-03 NOTE — Telephone Encounter (Signed)
 Patient called regarding dropping-off utility bill to be applied to grant funds.  Patient stated that he was going to ask [name] to put it in my box.  Noah Velasquez Patient Pharmacologist [facility]

## 2024-04-04 ENCOUNTER — Ambulatory Visit
Admission: RE | Admit: 2024-04-04 | Discharge: 2024-04-04 | Discharge status: Home / Self Care | Source: Ambulatory Visit | Attending: Radiation Oncology | Admitting: Radiation Oncology

## 2024-04-04 ENCOUNTER — Other Ambulatory Visit: Payer: Self-pay | Admitting: *Deleted

## 2024-04-04 ENCOUNTER — Other Ambulatory Visit: Payer: Self-pay

## 2024-04-04 DIAGNOSIS — Z510 Encounter for antineoplastic radiation therapy: Secondary | ICD-10-CM | POA: Diagnosis not present

## 2024-04-04 DIAGNOSIS — Z87891 Personal history of nicotine dependence: Secondary | ICD-10-CM | POA: Diagnosis not present

## 2024-04-04 DIAGNOSIS — Z5111 Encounter for antineoplastic chemotherapy: Secondary | ICD-10-CM | POA: Diagnosis not present

## 2024-04-04 DIAGNOSIS — C3412 Malignant neoplasm of upper lobe, left bronchus or lung: Secondary | ICD-10-CM | POA: Diagnosis not present

## 2024-04-04 LAB — RAD ONC ARIA SESSION SUMMARY
Course Elapsed Days: 14
Plan Fractions Treated to Date: 11
Plan Prescribed Dose Per Fraction: 2 Gy
Plan Total Fractions Prescribed: 30
Plan Total Prescribed Dose: 60 Gy
Reference Point Dosage Given to Date: 22 Gy
Reference Point Session Dosage Given: 2 Gy
Session Number: 11

## 2024-04-04 MED ORDER — SUCRALFATE 1 G PO TABS
1.0000 g | ORAL_TABLET | Freq: Two times a day (BID) | ORAL | 1 refills | Status: DC
  Filled 2024-04-04: qty 60, 30d supply, fill #0

## 2024-04-05 ENCOUNTER — Other Ambulatory Visit: Payer: Self-pay

## 2024-04-05 ENCOUNTER — Ambulatory Visit
Admission: RE | Admit: 2024-04-05 | Discharge: 2024-04-05 | Disposition: A | Discharge status: Home / Self Care | Source: Ambulatory Visit | Attending: Radiation Oncology | Admitting: Radiation Oncology

## 2024-04-05 DIAGNOSIS — C3412 Malignant neoplasm of upper lobe, left bronchus or lung: Secondary | ICD-10-CM | POA: Diagnosis not present

## 2024-04-05 DIAGNOSIS — Z87891 Personal history of nicotine dependence: Secondary | ICD-10-CM | POA: Diagnosis not present

## 2024-04-05 DIAGNOSIS — Z510 Encounter for antineoplastic radiation therapy: Secondary | ICD-10-CM | POA: Diagnosis not present

## 2024-04-05 DIAGNOSIS — Z5111 Encounter for antineoplastic chemotherapy: Secondary | ICD-10-CM | POA: Diagnosis not present

## 2024-04-05 LAB — RAD ONC ARIA SESSION SUMMARY
Course Elapsed Days: 15
Plan Fractions Treated to Date: 12
Plan Prescribed Dose Per Fraction: 2 Gy
Plan Total Fractions Prescribed: 30
Plan Total Prescribed Dose: 60 Gy
Reference Point Dosage Given to Date: 24 Gy
Reference Point Session Dosage Given: 2 Gy
Session Number: 12

## 2024-04-06 ENCOUNTER — Other Ambulatory Visit: Payer: Self-pay

## 2024-04-06 ENCOUNTER — Ambulatory Visit
Admission: RE | Admit: 2024-04-06 | Discharge: 2024-04-06 | Disposition: A | Discharge status: Home / Self Care | Source: Ambulatory Visit | Attending: Radiation Oncology | Admitting: Radiation Oncology

## 2024-04-06 DIAGNOSIS — Z87891 Personal history of nicotine dependence: Secondary | ICD-10-CM | POA: Diagnosis not present

## 2024-04-06 DIAGNOSIS — C3412 Malignant neoplasm of upper lobe, left bronchus or lung: Secondary | ICD-10-CM | POA: Diagnosis not present

## 2024-04-06 DIAGNOSIS — Z5111 Encounter for antineoplastic chemotherapy: Secondary | ICD-10-CM | POA: Diagnosis not present

## 2024-04-06 DIAGNOSIS — Z510 Encounter for antineoplastic radiation therapy: Secondary | ICD-10-CM | POA: Diagnosis not present

## 2024-04-06 LAB — RAD ONC ARIA SESSION SUMMARY
Course Elapsed Days: 16
Plan Fractions Treated to Date: 13
Plan Prescribed Dose Per Fraction: 2 Gy
Plan Total Fractions Prescribed: 30
Plan Total Prescribed Dose: 60 Gy
Reference Point Dosage Given to Date: 26 Gy
Reference Point Session Dosage Given: 2 Gy
Session Number: 13

## 2024-04-07 ENCOUNTER — Ambulatory Visit
Admission: RE | Admit: 2024-04-07 | Discharge: 2024-04-07 | Disposition: A | Discharge status: Home / Self Care | Source: Ambulatory Visit | Attending: Radiation Oncology | Admitting: Radiation Oncology

## 2024-04-07 ENCOUNTER — Other Ambulatory Visit: Payer: Self-pay

## 2024-04-07 DIAGNOSIS — Z87891 Personal history of nicotine dependence: Secondary | ICD-10-CM | POA: Diagnosis not present

## 2024-04-07 DIAGNOSIS — Z510 Encounter for antineoplastic radiation therapy: Secondary | ICD-10-CM | POA: Diagnosis not present

## 2024-04-07 DIAGNOSIS — C3412 Malignant neoplasm of upper lobe, left bronchus or lung: Secondary | ICD-10-CM | POA: Diagnosis not present

## 2024-04-07 DIAGNOSIS — Z5111 Encounter for antineoplastic chemotherapy: Secondary | ICD-10-CM | POA: Diagnosis not present

## 2024-04-07 LAB — RAD ONC ARIA SESSION SUMMARY
Course Elapsed Days: 17
Plan Fractions Treated to Date: 14
Plan Prescribed Dose Per Fraction: 2 Gy
Plan Total Fractions Prescribed: 30
Plan Total Prescribed Dose: 60 Gy
Reference Point Dosage Given to Date: 28 Gy
Reference Point Session Dosage Given: 2 Gy
Session Number: 14

## 2024-04-10 ENCOUNTER — Ambulatory Visit
Admission: RE | Admit: 2024-04-10 | Discharge: 2024-04-10 | Disposition: A | Discharge status: Home / Self Care | Source: Ambulatory Visit | Attending: Radiation Oncology | Admitting: Radiation Oncology

## 2024-04-10 ENCOUNTER — Other Ambulatory Visit: Payer: Self-pay

## 2024-04-10 DIAGNOSIS — Z87891 Personal history of nicotine dependence: Secondary | ICD-10-CM | POA: Diagnosis not present

## 2024-04-10 DIAGNOSIS — Z5111 Encounter for antineoplastic chemotherapy: Secondary | ICD-10-CM | POA: Diagnosis not present

## 2024-04-10 DIAGNOSIS — C3412 Malignant neoplasm of upper lobe, left bronchus or lung: Secondary | ICD-10-CM | POA: Diagnosis not present

## 2024-04-10 DIAGNOSIS — Z510 Encounter for antineoplastic radiation therapy: Secondary | ICD-10-CM | POA: Diagnosis not present

## 2024-04-10 LAB — RAD ONC ARIA SESSION SUMMARY
Course Elapsed Days: 20
Plan Fractions Treated to Date: 15
Plan Prescribed Dose Per Fraction: 2 Gy
Plan Total Fractions Prescribed: 30
Plan Total Prescribed Dose: 60 Gy
Reference Point Dosage Given to Date: 30 Gy
Reference Point Session Dosage Given: 2 Gy
Session Number: 15

## 2024-04-11 ENCOUNTER — Encounter: Payer: Self-pay | Admitting: Oncology

## 2024-04-11 ENCOUNTER — Inpatient Hospital Stay

## 2024-04-11 ENCOUNTER — Inpatient Hospital Stay (HOSPITAL_BASED_OUTPATIENT_CLINIC_OR_DEPARTMENT_OTHER): Admitting: Hospice and Palliative Medicine

## 2024-04-11 ENCOUNTER — Other Ambulatory Visit: Payer: Self-pay

## 2024-04-11 ENCOUNTER — Ambulatory Visit
Admission: RE | Admit: 2024-04-11 | Discharge: 2024-04-11 | Disposition: A | Discharge status: Home / Self Care | Source: Ambulatory Visit | Attending: Radiation Oncology | Admitting: Radiation Oncology

## 2024-04-11 VITALS — BP 139/83 | HR 72 | Temp 97.6°F | Resp 16

## 2024-04-11 DIAGNOSIS — C3412 Malignant neoplasm of upper lobe, left bronchus or lung: Secondary | ICD-10-CM

## 2024-04-11 DIAGNOSIS — Z510 Encounter for antineoplastic radiation therapy: Secondary | ICD-10-CM | POA: Diagnosis not present

## 2024-04-11 DIAGNOSIS — Z5111 Encounter for antineoplastic chemotherapy: Secondary | ICD-10-CM | POA: Diagnosis not present

## 2024-04-11 DIAGNOSIS — Z87891 Personal history of nicotine dependence: Secondary | ICD-10-CM | POA: Diagnosis not present

## 2024-04-11 LAB — RAD ONC ARIA SESSION SUMMARY
Course Elapsed Days: 21
Plan Fractions Treated to Date: 16
Plan Prescribed Dose Per Fraction: 2 Gy
Plan Total Fractions Prescribed: 30
Plan Total Prescribed Dose: 60 Gy
Reference Point Dosage Given to Date: 32 Gy
Reference Point Session Dosage Given: 2 Gy
Session Number: 16

## 2024-04-11 LAB — CBC WITH DIFFERENTIAL (CANCER CENTER ONLY)
Abs Immature Granulocytes: 0 K/uL (ref 0.00–0.07)
Basophils Absolute: 0.1 K/uL (ref 0.0–0.1)
Basophils Relative: 2 %
Eosinophils Absolute: 0.2 K/uL (ref 0.0–0.5)
Eosinophils Relative: 7 %
HCT: 27 % — ABNORMAL LOW (ref 39.0–52.0)
Hemoglobin: 9.4 g/dL — ABNORMAL LOW (ref 13.0–17.0)
Immature Granulocytes: 0 %
Lymphocytes Relative: 26 %
Lymphs Abs: 0.7 K/uL (ref 0.7–4.0)
MCH: 32 pg (ref 26.0–34.0)
MCHC: 34.8 g/dL (ref 30.0–36.0)
MCV: 91.8 fL (ref 80.0–100.0)
Monocytes Absolute: 0.2 K/uL (ref 0.1–1.0)
Monocytes Relative: 6 %
Neutro Abs: 1.7 K/uL (ref 1.7–7.7)
Neutrophils Relative %: 59 %
Platelet Count: 77 K/uL — ABNORMAL LOW (ref 150–400)
RBC: 2.94 MIL/uL — ABNORMAL LOW (ref 4.22–5.81)
RDW: 13.4 % (ref 11.5–15.5)
WBC Count: 2.8 K/uL — ABNORMAL LOW (ref 4.0–10.5)
nRBC: 0 % (ref 0.0–0.2)

## 2024-04-11 LAB — BASIC METABOLIC PANEL - CANCER CENTER ONLY
Anion gap: 9 (ref 5–15)
BUN: 14 mg/dL (ref 6–20)
CO2: 20 mmol/L — ABNORMAL LOW (ref 22–32)
Calcium: 8.9 mg/dL (ref 8.9–10.3)
Chloride: 101 mmol/L (ref 98–111)
Creatinine: 0.91 mg/dL (ref 0.61–1.24)
GFR, Estimated: >60 mL/min (ref >60)
Glucose, Bld: 97 mg/dL (ref 70–99)
Potassium: 4.1 mmol/L (ref 3.5–5.1)
Sodium: 130 mmol/L — ABNORMAL LOW (ref 135–145)

## 2024-04-11 MED ORDER — SODIUM CHLORIDE 0.9 % IV SOLN
INTRAVENOUS | Status: DC
  Filled 2024-04-11 (×2): qty 250

## 2024-04-11 NOTE — Progress Notes (Signed)
 Symptom Management Clinic [facility] at [facility] [phone] [phone]  Patient Care Team: Antonette Angeline ORN, NP as PCP - General (Internal Medicine) Verdene Gills, RN as Oncology Nurse Navigator [name], MD as Consulting Physician (Oncology) Rennie Cindy SAUNDERS, MD as Consulting Physician (Oncology)   NAME OF PATIENT: Noah Velasquez  [id number]  Dec 25, 1965   DATE OF VISIT: 04/11/24  REASON FOR CONSULT: Ferry Matthis is a 58 y.o. male with multiple medical problems including history of CHF, interstitial lung disease, diabetes limited stage with possible left humeral metastasis.   INTERVAL HISTORY: Patient saw Dr. Melanee on 03/28/2024 for cycle 2 cisplatin  etoposide  chemotherapy.  Patient has had neutropenia requiring Neupogen .  Patient presents to clinic today for follow-up labs and consideration of IV fluids and supportive care.  Today, patient reports that he is feeling good.  He denies any significant complaints or concerns.  He does endorse poor fluid intake and requests IV fluids.  Denies any neurologic complaints. Denies recent fevers or illnesses. Denies any easy bleeding or bruising. Reports good appetite and denies weight loss. Denies chest pain. Denies any nausea, vomiting, constipation, or diarrhea. Denies urinary complaints. Patient offers no further specific complaints today.   PAST MEDICAL HISTORY: Past Medical History:  Diagnosis Date   Alcohol  use disorder    Aortic atherosclerosis (HCC)    Bronchiolo-alveolar adenocarcinoma of left lung (HCC)    CHF (congestive heart failure) (HCC)    Chronic hip pain    Chronic pancreatitis (HCC)    Cigarette smoker    CKD (chronic kidney disease) stage 3, GFR 30-59 ml/min (HCC)    COPD (chronic obstructive pulmonary disease) (HCC)    Crack cocaine use    as of 01-21-24, pt states he last used a few weeks ago   Depression    DM (diabetes mellitus), type 2 (HCC)     Dyspnea    ED (erectile dysfunction)    ESBL (extended spectrum beta-lactamase) producing bacteria infection 2021   GERD (gastroesophageal reflux disease)    Hyperlipidemia    Hypertension    ILD (interstitial lung disease) (HCC)    Marijuana use    Osteoarthritis    Pneumonia    PTSD (post-traumatic stress disorder)    Seizure (HCC)    x1 years ago   Viral infection of left eye     PAST SURGICAL HISTORY:  Past Surgical History:  Procedure Laterality Date   BRONCHOSCOPY, WITH BIOPSY USING ELECTROMAGNETIC NAVIGATION Left 01/24/2024   Procedure: BRONCHOSCOPY, WITH BIOPSY USING ELECTROMAGNETIC NAVIGATION;  Surgeon: Isaiah Scrivener, MD;  Location: [facility] ORS;  Service: Pulmonary;  Laterality: Left;   ENDOBRONCHIAL ULTRASOUND Right 01/24/2024   Procedure: ENDOBRONCHIAL ULTRASOUND (EBUS);  Surgeon: Kasa, Kurian, MD;  Location: [facility] ORS;  Service: Pulmonary;  Laterality: Right;   IR IMAGING GUIDED PORT INSERTION  03/15/2024   pancreatic stent placement      HEMATOLOGY/ONCOLOGY HISTORY:  Oncology History  Small cell lung cancer, left upper lobe (HCC)  02/21/2024 Initial Diagnosis   Small cell lung cancer, left upper lobe (HCC)   02/21/2024 Cancer Staging   Staging form: Lung, AJCC V9 - Clinical stage from 02/21/2024: Stage IIIB (cT2, cN2b, cM0) - Signed by [name], MD on 02/21/2024 Method of lymph node assessment: Clinical   03/01/2024 -  Chemotherapy   Patient is on Treatment Plan : LUNG SMALL CELL Cisplatin  (80) D1 + Etoposide  (100) D1-3 q21d       ALLERGIES:  is allergic to hctz [  hydrochlorothiazide] and ibuprofen.  MEDICATIONS:  Current Outpatient Medications  Medication Sig Dispense Refill   Accu-Chek Softclix Lancets lancets Use with blood glucose monitor in the morning, at noon, and at bedtime. May substitute to any manufacturer covered by patient's insurance. 100 each 0   amLODipine -olmesartan  (AZOR ) 10-40 MG tablet Take 1 tablet by mouth daily. 90 tablet 1   Blood Glucose  Monitoring Suppl (BLOOD GLUCOSE MONITOR SYSTEM) w/Device KIT Check blood sugar in the morning, at noon, and at bedtime. May substitute to any manufacturer covered by patient's insurance. 1 kit 0   dexamethasone  (DECADRON ) 4 MG tablet Take 2 tablets (8 mg total) by mouth daily. Take for 1 day starting the day after chemotherapy on day 4. Take with food. 30 tablet 1   empagliflozin  (JARDIANCE ) 10 MG TABS tablet Take 1 tablet (10 mg total) by mouth daily before breakfast. 90 tablet 1   lidocaine -prilocaine  (EMLA ) cream Apply to affected area once 30 g 3   lovastatin  (MEVACOR ) 10 MG tablet Take 1 tablet (10 mg total) by mouth at bedtime. 90 tablet 1   metFORMIN  (GLUCOPHAGE ) 500 MG tablet Take 1 tablet (500 mg total) by mouth daily with breakfast. 90 tablet 1   Nebulizers (COMPRESSOR/NEBULIZER) MISC Nebulizer and tubes/mask to use as directed with DuoNeb as needed 1 each 0   omeprazole  (PRILOSEC) 20 MG capsule Take 1 capsule (20 mg total) by mouth daily. 90 capsule 1   ondansetron  (ZOFRAN ) 8 MG tablet Take 1 tablet (8 mg total) by mouth every 8 (eight) hours as needed for nausea or vomiting. Start on the third day after cisplatin . 30 tablet 1   OXYGEN  Inhale 2 L into the lungs as needed.     Pancrelipase , Lip-Prot-Amyl, (ZENPEP ) 15000-47000 units CPEP Take 1 capsule (15,000 Units total) by mouth 3 (three) times daily. 270 capsule 1   prochlorperazine  (COMPAZINE ) 10 MG tablet Take 1 tablet (10 mg total) by mouth every 6 (six) hours as needed for nausea or vomiting. 30 tablet 1   sertraline  (ZOLOFT ) 100 MG tablet Take 1 tablet (100 mg total) by mouth daily. 90 tablet 0   spironolactone  (ALDACTONE ) 25 MG tablet Take 1 tablet (25 mg total) by mouth daily. 90 tablet 3   traZODone  (DESYREL ) 150 MG tablet Take 1 tablet (150 mg total) by mouth at bedtime as needed for sleep. 90 tablet 1   VENTOLIN  HFA 108 (90 Base) MCG/ACT inhaler Inhale 1-2 puffs into the lungs every 6 (six) hours as needed for wheezing or  shortness of breath (or coughing). 18 g 6   ipratropium-albuterol  (DUONEB) 0.5-2.5 (3) MG/3ML SOLN Take 3 mLs by nebulization every 2 (two) hours as needed (wheeze, SOB). Can use instead of albuterol  inhaler (Patient not taking: Reported on 03/31/2024) 60 mL 1   naproxen  (NAPROSYN ) 250 MG tablet Take 250 mg by mouth 2 (two) times daily with a meal. (Patient not taking: Reported on 04/11/2024)     nicotine  (NICODERM CQ  - DOSED IN MG/24 HOURS) 21 mg/24hr patch Place 1 patch (21 mg total) onto the skin daily. (Patient not taking: Reported on 04/11/2024) 28 patch 0   sucralfate  (CARAFATE ) 1 g tablet Take 1 tablet (1 g total) by mouth 2 (two) times daily. Dissolve tablet in 4 T warm water  swish and swallow. (Patient not taking: Reported on 04/11/2024) 60 tablet 1   No current facility-administered medications for this visit.   Facility-Administered Medications Ordered in Other Visits  Medication Dose Route Frequency Provider Last Rate Last Admin  0.9 %  sodium chloride  infusion   Intravenous Continuous Kenyata Guess, Fonda SAUNDERS, NP 500 mL/hr at 04/11/24 1248 New Bag at 04/11/24 1248    VITAL SIGNS: BP 139/83 (BP Location: Right Arm, Patient Position: Sitting, Cuff Size: Normal)   Pulse 72   Temp 97.6 F (36.4 C) (Tympanic)   Resp 16   SpO2 98%  There were no vitals filed for this visit.  Estimated body mass index is 25.89 kg/m as calculated from the following:   Height as of 03/31/24: 5' 7.5 (1.715 m).   Weight as of 03/31/24: 167 lb 12.8 oz (76.1 kg).  LABS: CBC:    Component Value Date/Time   WBC 2.8 (L) 04/11/2024 1114   WBC 10.7 (H) 01/16/2024 0702   HGB 9.4 (L) 04/11/2024 1114   HCT 27.0 (L) 04/11/2024 1114   PLT 77 (L) 04/11/2024 1114   MCV 91.8 04/11/2024 1114   NEUTROABS 1.7 04/11/2024 1114   LYMPHSABS 0.7 04/11/2024 1114   MONOABS 0.2 04/11/2024 1114   EOSABS 0.2 04/11/2024 1114   BASOSABS 0.1 04/11/2024 1114   Comprehensive Metabolic Panel:    Component Value Date/Time   NA 130  (L) 04/11/2024 1114   K 4.1 04/11/2024 1114   CL 101 04/11/2024 1114   CO2 20 (L) 04/11/2024 1114   BUN 14 04/11/2024 1114   CREATININE 0.91 04/11/2024 1114   CREATININE 1.03 12/07/2023 1334   GLUCOSE 97 04/11/2024 1114   CALCIUM  8.9 04/11/2024 1114   AST 23 03/28/2024 0823   ALT 12 03/28/2024 0823   ALKPHOS 97 03/28/2024 0823   BILITOT 0.3 03/28/2024 0823   PROT 8.0 03/28/2024 0823   ALBUMIN 3.5 03/28/2024 0823    RADIOGRAPHIC STUDIES: IR IMAGING GUIDED PORT INSERTION Result Date: 03/15/2024 CLINICAL DATA:  Lung cancer, access for chemotherapy EXAM: RIGHT INTERNAL JUGULAR SINGLE LUMEN POWER PORT CATHETER INSERTION Date:  03/15/2024 03/15/2024 1:45 pm Radiologist:  CHRISTELLA. Frederic Specking, MD Guidance:  Ultrasound and fluoroscopic MEDICATIONS: 1% lidocaine  local with epinephrine  ANESTHESIA/SEDATION: Versed  2.0 mg IV; Fentanyl  100 mcg IV; Moderate Sedation Time:  20 minute The patient was continuously monitored during the procedure by the interventional radiology nurse under my direct supervision. FLUOROSCOPY: 0 minutes, 36 seconds (4 mGy) COMPLICATIONS: None immediate. CONTRAST:  None. PROCEDURE: Informed consent was obtained from the patient following explanation of the procedure, risks, benefits and alternatives. The patient understands, agrees and consents for the procedure. All questions were addressed. A time out was performed. Maximal barrier sterile technique utilized including caps, mask, sterile gowns, sterile gloves, large sterile drape, hand hygiene, and 2% chlorhexidine  scrub. Under sterile conditions and local anesthesia, right internal jugular micropuncture venous access was performed. Access was performed with ultrasound. Images were obtained for documentation of the patent right internal jugular vein. A guide wire was inserted followed by a transitional dilator. This allowed insertion of a guide wire and catheter into the IVC. Measurements were obtained from the SVC / RA junction back to the  right IJ venotomy site. In the right infraclavicular chest, a subcutaneous pocket was created over the second anterior rib. This was done under sterile conditions and local anesthesia. 1% lidocaine  with epinephrine  was utilized for this. A 2.5 cm incision was made in the skin. Blunt dissection was performed to create a subcutaneous pocket over the right pectoralis major muscle. The pocket was flushed with saline vigorously. There was adequate hemostasis. The port catheter was assembled and checked for leakage. The port catheter was secured in the pocket with two  retention sutures. The tubing was tunneled subcutaneously to the right venotomy site and inserted into the SVC/RA junction through a valved peel-away sheath. Position was confirmed with fluoroscopy. Images were obtained for documentation. The patient tolerated the procedure well. No immediate complications. Incisions were closed in a two layer fashion with 4 - 0 Vicryl suture. Dermabond was applied to the skin. The port catheter was accessed, blood was aspirated followed by saline and heparin  flushes. Needle was removed. A dry sterile dressing was applied. IMPRESSION: Ultrasound and fluoroscopically guided right internal jugular single lumen power port catheter insertion. Tip in the SVC/RA junction. Catheter ready for use. Electronically Signed   By: CHRISTELLA.  Shick M.D.   On: 03/15/2024 13:55    PERFORMANCE STATUS (ECOG) : 3 - Symptomatic, >50% confined to bed  Review of Systems Unless otherwise noted, a complete review of systems is negative.  Physical Exam General: NAD Cardiovascular: regular rate and rhythm Pulmonary: clear ant fields Abdomen: soft, nontender, + bowel sounds GU: no suprapubic tenderness Extremities: no edema, no joint deformities Skin: no rashes Neurological: Weakness but otherwise nonfocal  IMPRESSION/PLAN: Limited stage small cell lung cancer -on cisplatin  etoposide  chemotherapy  Pancytopenia -likely at nadir following  chemotherapy.  No evidence of active infection.    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  Will proceed with gentle IV fluids in light of his history of CHF.  MD follow-up next week.  Case and plan discussed with Dr. Melanee.  Will have follow-up palliative care televisit in 1 month   Patient expressed understanding and was in agreement with this plan. He also understands that He can call clinic at any time with any questions, concerns, or complaints.   Thank you for allowing me to participate in the care of this very pleasant patient.   Time Total: 20 minutes  Visit consisted of counseling and education dealing with the complex and emotionally intense issues of symptom management in the setting of serious illness.Greater than 50%  of this time was spent counseling and coordinating care related to the above assessment and plan.  Signed by: Fonda Mower, PhD, NP-C

## 2024-04-12 ENCOUNTER — Other Ambulatory Visit: Payer: Self-pay

## 2024-04-12 ENCOUNTER — Ambulatory Visit
Admission: RE | Admit: 2024-04-12 | Discharge: 2024-04-12 | Disposition: A | Discharge status: Home / Self Care | Source: Ambulatory Visit | Attending: Radiation Oncology | Admitting: Radiation Oncology

## 2024-04-12 DIAGNOSIS — C3412 Malignant neoplasm of upper lobe, left bronchus or lung: Secondary | ICD-10-CM | POA: Diagnosis not present

## 2024-04-12 DIAGNOSIS — Z510 Encounter for antineoplastic radiation therapy: Secondary | ICD-10-CM | POA: Diagnosis not present

## 2024-04-12 DIAGNOSIS — Z5111 Encounter for antineoplastic chemotherapy: Secondary | ICD-10-CM | POA: Diagnosis not present

## 2024-04-12 DIAGNOSIS — Z87891 Personal history of nicotine dependence: Secondary | ICD-10-CM | POA: Diagnosis not present

## 2024-04-12 LAB — RAD ONC ARIA SESSION SUMMARY
Course Elapsed Days: 22
Plan Fractions Treated to Date: 17
Plan Prescribed Dose Per Fraction: 2 Gy
Plan Total Fractions Prescribed: 30
Plan Total Prescribed Dose: 60 Gy
Reference Point Dosage Given to Date: 34 Gy
Reference Point Session Dosage Given: 2 Gy
Session Number: 17

## 2024-04-13 ENCOUNTER — Other Ambulatory Visit: Payer: Self-pay

## 2024-04-13 ENCOUNTER — Ambulatory Visit
Admission: RE | Admit: 2024-04-13 | Discharge: 2024-04-13 | Disposition: A | Discharge status: Home / Self Care | Source: Ambulatory Visit | Attending: Radiation Oncology | Admitting: Radiation Oncology

## 2024-04-13 DIAGNOSIS — Z5111 Encounter for antineoplastic chemotherapy: Secondary | ICD-10-CM | POA: Diagnosis not present

## 2024-04-13 DIAGNOSIS — Z87891 Personal history of nicotine dependence: Secondary | ICD-10-CM | POA: Diagnosis not present

## 2024-04-13 DIAGNOSIS — Z510 Encounter for antineoplastic radiation therapy: Secondary | ICD-10-CM | POA: Diagnosis not present

## 2024-04-13 DIAGNOSIS — C3412 Malignant neoplasm of upper lobe, left bronchus or lung: Secondary | ICD-10-CM | POA: Diagnosis not present

## 2024-04-13 LAB — RAD ONC ARIA SESSION SUMMARY
Course Elapsed Days: 23
Plan Fractions Treated to Date: 18
Plan Prescribed Dose Per Fraction: 2 Gy
Plan Total Fractions Prescribed: 30
Plan Total Prescribed Dose: 60 Gy
Reference Point Dosage Given to Date: 36 Gy
Reference Point Session Dosage Given: 2 Gy
Session Number: 18

## 2024-04-14 ENCOUNTER — Ambulatory Visit

## 2024-04-18 ENCOUNTER — Inpatient Hospital Stay: Admitting: Oncology

## 2024-04-18 ENCOUNTER — Inpatient Hospital Stay

## 2024-04-18 ENCOUNTER — Ambulatory Visit
Admission: RE | Admit: 2024-04-18 | Discharge: 2024-04-18 | Disposition: A | Discharge status: Home / Self Care | Source: Ambulatory Visit | Attending: Radiation Oncology | Admitting: Radiation Oncology

## 2024-04-18 ENCOUNTER — Other Ambulatory Visit: Payer: Self-pay | Admitting: Oncology

## 2024-04-18 ENCOUNTER — Ambulatory Visit

## 2024-04-18 DIAGNOSIS — C7951 Secondary malignant neoplasm of bone: Secondary | ICD-10-CM | POA: Insufficient documentation

## 2024-04-18 DIAGNOSIS — Z87891 Personal history of nicotine dependence: Secondary | ICD-10-CM | POA: Insufficient documentation

## 2024-04-18 DIAGNOSIS — Z5111 Encounter for antineoplastic chemotherapy: Secondary | ICD-10-CM | POA: Insufficient documentation

## 2024-04-18 DIAGNOSIS — C3412 Malignant neoplasm of upper lobe, left bronchus or lung: Secondary | ICD-10-CM

## 2024-04-18 DIAGNOSIS — Z510 Encounter for antineoplastic radiation therapy: Secondary | ICD-10-CM | POA: Insufficient documentation

## 2024-04-19 ENCOUNTER — Other Ambulatory Visit: Payer: Self-pay | Admitting: *Deleted

## 2024-04-19 ENCOUNTER — Inpatient Hospital Stay

## 2024-04-19 ENCOUNTER — Other Ambulatory Visit: Payer: Self-pay

## 2024-04-19 ENCOUNTER — Ambulatory Visit
Admission: RE | Admit: 2024-04-19 | Discharge: 2024-04-19 | Disposition: A | Discharge status: Home / Self Care | Source: Ambulatory Visit | Attending: Radiation Oncology | Admitting: Radiation Oncology

## 2024-04-19 DIAGNOSIS — Z510 Encounter for antineoplastic radiation therapy: Secondary | ICD-10-CM | POA: Diagnosis not present

## 2024-04-19 DIAGNOSIS — Z87891 Personal history of nicotine dependence: Secondary | ICD-10-CM | POA: Diagnosis not present

## 2024-04-19 DIAGNOSIS — C3412 Malignant neoplasm of upper lobe, left bronchus or lung: Secondary | ICD-10-CM

## 2024-04-19 DIAGNOSIS — C7951 Secondary malignant neoplasm of bone: Secondary | ICD-10-CM | POA: Insufficient documentation

## 2024-04-19 DIAGNOSIS — Z5111 Encounter for antineoplastic chemotherapy: Secondary | ICD-10-CM | POA: Insufficient documentation

## 2024-04-19 LAB — RAD ONC ARIA SESSION SUMMARY
Course Elapsed Days: 29
Plan Fractions Treated to Date: 19
Plan Prescribed Dose Per Fraction: 2 Gy
Plan Total Fractions Prescribed: 30
Plan Total Prescribed Dose: 60 Gy
Reference Point Dosage Given to Date: 38 Gy
Reference Point Session Dosage Given: 2 Gy
Session Number: 19

## 2024-04-19 LAB — CBC (CANCER CENTER ONLY)
HCT: 28.6 % — ABNORMAL LOW (ref 39.0–52.0)
Hemoglobin: 9.7 g/dL — ABNORMAL LOW (ref 13.0–17.0)
MCH: 32.4 pg (ref 26.0–34.0)
MCHC: 33.9 g/dL (ref 30.0–36.0)
MCV: 95.7 fL (ref 80.0–100.0)
Platelet Count: 260 K/uL (ref 150–400)
RBC: 2.99 MIL/uL — ABNORMAL LOW (ref 4.22–5.81)
RDW: 16.1 % — ABNORMAL HIGH (ref 11.5–15.5)
WBC Count: 2.4 K/uL — ABNORMAL LOW (ref 4.0–10.5)
nRBC: 1.2 % — ABNORMAL HIGH (ref 0.0–0.2)

## 2024-04-20 ENCOUNTER — Inpatient Hospital Stay

## 2024-04-20 ENCOUNTER — Other Ambulatory Visit: Payer: Self-pay

## 2024-04-20 ENCOUNTER — Ambulatory Visit
Admission: RE | Admit: 2024-04-20 | Discharge: 2024-04-20 | Disposition: A | Discharge status: Home / Self Care | Source: Ambulatory Visit | Attending: Radiation Oncology | Admitting: Radiation Oncology

## 2024-04-20 DIAGNOSIS — Z510 Encounter for antineoplastic radiation therapy: Secondary | ICD-10-CM | POA: Diagnosis not present

## 2024-04-20 DIAGNOSIS — C3412 Malignant neoplasm of upper lobe, left bronchus or lung: Secondary | ICD-10-CM | POA: Diagnosis not present

## 2024-04-20 DIAGNOSIS — Z87891 Personal history of nicotine dependence: Secondary | ICD-10-CM | POA: Diagnosis not present

## 2024-04-20 LAB — RAD ONC ARIA SESSION SUMMARY
Course Elapsed Days: 30
Plan Fractions Treated to Date: 20
Plan Prescribed Dose Per Fraction: 2 Gy
Plan Total Fractions Prescribed: 30
Plan Total Prescribed Dose: 60 Gy
Reference Point Dosage Given to Date: 40 Gy
Reference Point Session Dosage Given: 2 Gy
Session Number: 20

## 2024-04-21 ENCOUNTER — Ambulatory Visit
Admission: RE | Admit: 2024-04-21 | Discharge: 2024-04-21 | Disposition: A | Discharge status: Home / Self Care | Source: Ambulatory Visit | Attending: Radiation Oncology | Admitting: Radiation Oncology

## 2024-04-21 ENCOUNTER — Other Ambulatory Visit: Payer: Self-pay

## 2024-04-21 DIAGNOSIS — Z510 Encounter for antineoplastic radiation therapy: Secondary | ICD-10-CM | POA: Diagnosis not present

## 2024-04-21 DIAGNOSIS — C3412 Malignant neoplasm of upper lobe, left bronchus or lung: Secondary | ICD-10-CM | POA: Diagnosis not present

## 2024-04-21 DIAGNOSIS — Z87891 Personal history of nicotine dependence: Secondary | ICD-10-CM | POA: Diagnosis not present

## 2024-04-21 LAB — RAD ONC ARIA SESSION SUMMARY
Course Elapsed Days: 31
Plan Fractions Treated to Date: 21
Plan Prescribed Dose Per Fraction: 2 Gy
Plan Total Fractions Prescribed: 30
Plan Total Prescribed Dose: 60 Gy
Reference Point Dosage Given to Date: 42 Gy
Reference Point Session Dosage Given: 2 Gy
Session Number: 21

## 2024-04-24 ENCOUNTER — Inpatient Hospital Stay

## 2024-04-24 ENCOUNTER — Ambulatory Visit
Admission: RE | Admit: 2024-04-24 | Discharge: 2024-04-24 | Disposition: A | Discharge status: Home / Self Care | Source: Ambulatory Visit | Attending: Radiation Oncology | Admitting: Radiation Oncology

## 2024-04-24 ENCOUNTER — Encounter: Payer: Self-pay | Admitting: Oncology

## 2024-04-24 ENCOUNTER — Inpatient Hospital Stay (HOSPITAL_BASED_OUTPATIENT_CLINIC_OR_DEPARTMENT_OTHER): Admitting: Oncology

## 2024-04-24 ENCOUNTER — Other Ambulatory Visit: Payer: Self-pay

## 2024-04-24 VITALS — BP 143/89 | HR 95

## 2024-04-24 VITALS — BP 139/84 | HR 78 | Temp 97.6°F | Resp 19 | Ht 67.5 in | Wt 167.0 lb

## 2024-04-24 DIAGNOSIS — C3412 Malignant neoplasm of upper lobe, left bronchus or lung: Secondary | ICD-10-CM

## 2024-04-24 DIAGNOSIS — Z5111 Encounter for antineoplastic chemotherapy: Secondary | ICD-10-CM | POA: Diagnosis not present

## 2024-04-24 DIAGNOSIS — Z510 Encounter for antineoplastic radiation therapy: Secondary | ICD-10-CM | POA: Diagnosis not present

## 2024-04-24 DIAGNOSIS — Z87891 Personal history of nicotine dependence: Secondary | ICD-10-CM | POA: Diagnosis not present

## 2024-04-24 LAB — MAGNESIUM: Magnesium: 2.1 mg/dL (ref 1.7–2.4)

## 2024-04-24 LAB — CBC WITH DIFFERENTIAL (CANCER CENTER ONLY)
Abs Immature Granulocytes: 0.05 K/uL (ref 0.00–0.07)
Basophils Absolute: 0 K/uL (ref 0.0–0.1)
Basophils Relative: 0 %
Eosinophils Absolute: 0.1 K/uL (ref 0.0–0.5)
Eosinophils Relative: 1 %
HCT: 31 % — ABNORMAL LOW (ref 39.0–52.0)
Hemoglobin: 10.2 g/dL — ABNORMAL LOW (ref 13.0–17.0)
Immature Granulocytes: 1 %
Lymphocytes Relative: 13 %
Lymphs Abs: 0.6 K/uL — ABNORMAL LOW (ref 0.7–4.0)
MCH: 32.5 pg (ref 26.0–34.0)
MCHC: 32.9 g/dL (ref 30.0–36.0)
MCV: 98.7 fL (ref 80.0–100.0)
Monocytes Absolute: 0.5 K/uL (ref 0.1–1.0)
Monocytes Relative: 11 %
Neutro Abs: 3.4 K/uL (ref 1.7–7.7)
Neutrophils Relative %: 74 %
Platelet Count: 331 K/uL (ref 150–400)
RBC: 3.14 MIL/uL — ABNORMAL LOW (ref 4.22–5.81)
RDW: 17.9 % — ABNORMAL HIGH (ref 11.5–15.5)
WBC Count: 4.6 K/uL (ref 4.0–10.5)
nRBC: 0 % (ref 0.0–0.2)

## 2024-04-24 LAB — CMP (CANCER CENTER ONLY)
ALT: 11 U/L (ref 0–44)
AST: 24 U/L (ref 15–41)
Albumin: 3.6 g/dL (ref 3.5–5.0)
Alkaline Phosphatase: 97 U/L (ref 38–126)
Anion gap: 11 (ref 5–15)
BUN: 16 mg/dL (ref 6–20)
CO2: 22 mmol/L (ref 22–32)
Calcium: 9 mg/dL (ref 8.9–10.3)
Chloride: 99 mmol/L (ref 98–111)
Creatinine: 1.19 mg/dL (ref 0.61–1.24)
GFR, Estimated: >60 mL/min (ref >60)
Glucose, Bld: 185 mg/dL — ABNORMAL HIGH (ref 70–99)
Potassium: 4 mmol/L (ref 3.5–5.1)
Sodium: 132 mmol/L — ABNORMAL LOW (ref 135–145)
Total Bilirubin: 0.3 mg/dL (ref 0.0–1.2)
Total Protein: 7.9 g/dL (ref 6.5–8.1)

## 2024-04-24 LAB — RAD ONC ARIA SESSION SUMMARY
Course Elapsed Days: 34
Plan Fractions Treated to Date: 22
Plan Prescribed Dose Per Fraction: 2 Gy
Plan Total Fractions Prescribed: 30
Plan Total Prescribed Dose: 60 Gy
Reference Point Dosage Given to Date: 44 Gy
Reference Point Session Dosage Given: 2 Gy
Session Number: 22

## 2024-04-24 MED ORDER — POTASSIUM CHLORIDE IN NACL 20-0.9 MEQ/L-% IV SOLN
Freq: Once | INTRAVENOUS | Status: AC
  Filled 2024-04-24: qty 1000

## 2024-04-24 MED ORDER — SODIUM CHLORIDE 0.9 % IV SOLN
100.0000 mg/m2 | Freq: Once | INTRAVENOUS | Status: AC
  Administered 2024-04-24: 200 mg via INTRAVENOUS
  Filled 2024-04-24: qty 10

## 2024-04-24 MED ORDER — DEXAMETHASONE SODIUM PHOSPHATE 10 MG/ML IJ SOLN
10.0000 mg | Freq: Once | INTRAMUSCULAR | Status: AC
  Administered 2024-04-24: 10 mg via INTRAVENOUS
  Filled 2024-04-24: qty 1

## 2024-04-24 MED ORDER — SODIUM CHLORIDE 0.9 % IV SOLN
60.0000 mg/m2 | Freq: Once | INTRAVENOUS | Status: AC
  Administered 2024-04-24: 113 mg via INTRAVENOUS
  Filled 2024-04-24: qty 113

## 2024-04-24 MED ORDER — APREPITANT 130 MG/18ML IV EMUL
130.0000 mg | Freq: Once | INTRAVENOUS | Status: AC
  Administered 2024-04-24: 130 mg via INTRAVENOUS
  Filled 2024-04-24: qty 18

## 2024-04-24 MED ORDER — PALONOSETRON HCL INJECTION 0.25 MG/5ML
0.2500 mg | Freq: Once | INTRAVENOUS | Status: AC
  Administered 2024-04-24: 0.25 mg via INTRAVENOUS
  Filled 2024-04-24: qty 5

## 2024-04-24 MED ORDER — SODIUM CHLORIDE 0.9 % IV SOLN
INTRAVENOUS | Status: DC
  Filled 2024-04-24: qty 250

## 2024-04-24 MED ORDER — MAGNESIUM SULFATE 2 GM/50ML IV SOLN
2.0000 g | Freq: Once | INTRAVENOUS | Status: AC
  Administered 2024-04-24: 2 g via INTRAVENOUS
  Filled 2024-04-24: qty 50

## 2024-04-24 NOTE — Progress Notes (Signed)
 Hematology/Oncology Consult note [facility]  Telephone:([phone] [phone]  Patient Care Team: Antonette Angeline ORN, NP as PCP - General (Internal Medicine) Verdene Gills, RN as Oncology Nurse Navigator [name], MD as Consulting Physician (Oncology) Rennie Cindy SAUNDERS, MD as Consulting Physician (Oncology)   Name of the patient: Noah Velasquez  [id number]  12-23-1965   Date of visit: 04/24/24  Diagnosis-  Cancer Staging  Small cell lung cancer, left upper lobe [facility]) Staging form: Lung, AJCC V9 - Clinical stage from 02/21/2024: Stage IIIB (cT2, cN2b, cM0) - Signed by [name], MD on 02/21/2024 Method of lymph node assessment: Clinical    Chief complaint/ Reason for visit-on treatment assessment prior to cycle 3 of cisplatin  etoposide  chemotherapy  Heme/Onc history: Patient is a 58 year old African-American male with a past medical history significant for CHF, interstitial lung disease, type 2 diabetes.  He is mainly wheelchair-bound but lives independently.  He underwent CT chest without contrast when he had a chest x-ray which showed a lung nodule.  CT chest showed a 3.7 x 1.8 cm lobulated mass in the left upper lobe abutting the chest wall concerning for primary bronchogenic carcinoma.  Adjacent satellite nodularity in the left upper lobe.  14 mm right paratracheal node and 14 mm short axis prevascular node.  Moderate centrilobular and paraseptal emphysematous changes.  MRI brain with and without contrast showed remote infarcts and chronic microvascular disease but no evidence of acute intracranial abnormality.  Patient was seen by Dr. Isaiah and underwent bronchoscopy.  Left upper lobe lung biopsy was consistent with poorly differentiated carcinoma consistent with small cell carcinoma.  Tumor was positive for TTF-1 and neuroendocrine markers synaptophysin.  Negative for Napsin A.  Negative for squamous cell marker p40.FNA of the right  paratracheal adenopathy was done and was negative for malignancy but left-sided mediastinal and hilar lymph nodes were not sampled at the time of bronchoscopy.  He uses 2 L of oxygen  mainly at night but not during the day   PET CT scan in July 2025 showed left upper lobe lung mass with an SUV of 15.7 consistent with neoplasm.  Additional small adjacent nodules in the left upper lobe.  Several left-sided upper mediastinal hypermetabolic lymph nodes including an AP window node with an SUV of 13.8 measuring 3 x 2.3 cm.  Another lymph node to the left of the ascending aorta measuring 3.6 x 1.7 cm with an SUV of 15.9.  Small nodes superior to this level at the level of great vessels as well as additional suspicious nodes in the right paratracheal region with an SUV of 4.4.  Area of uptake along the proximal left humeral shaft with an SUV of 9.3 which could be an area of isolated bone metastases.  Low level of uptake in the right inguinal lymph node with an SUV of 3.7.   Patient had MRI of his left humerus which showed a 6 cm mass in the intramedullary region in the diaphysis of mid to proximal left humerus which was concerning for metastatic disease.  Patient is still being treated with the potential curative intent with a combination chemoradiation followed by consideration for palliative radiation to left humerus lesion    Interval history-tolerating chemotherapy well so far.  Denies any specific complaints at this time.  He had some baseline neuropathy even prior to starting chemotherapy which she says is presently improved  ECOG PS- 2 Pain scale- 0   Review of systems- Review of  Systems  Constitutional:  Negative for chills, fever, malaise/fatigue and weight loss.  HENT:  Negative for congestion, ear discharge and nosebleeds.   Eyes:  Negative for blurred vision.  Respiratory:  Negative for cough, hemoptysis, sputum production, shortness of breath and wheezing.   Cardiovascular:  Negative for chest  pain, palpitations, orthopnea and claudication.  Gastrointestinal:  Negative for abdominal pain, blood in stool, constipation, diarrhea, heartburn, melena, nausea and vomiting.  Genitourinary:  Negative for dysuria, flank pain, frequency, hematuria and urgency.  Musculoskeletal:  Negative for back pain, joint pain and myalgias.  Skin:  Negative for rash.  Neurological:  Negative for dizziness, tingling, focal weakness, seizures, weakness and headaches.  Endo/Heme/Allergies:  Does not bruise/bleed easily.  Psychiatric/Behavioral:  Negative for depression and suicidal ideas. The patient does not have insomnia.       Allergies  Allergen Reactions   Hctz [Hydrochlorothiazide] Swelling   Ibuprofen Swelling     Past Medical History:  Diagnosis Date   Alcohol  use disorder    Aortic atherosclerosis (HCC)    Bronchiolo-alveolar adenocarcinoma of left lung (HCC)    CHF (congestive heart failure) (HCC)    Chronic hip pain    Chronic pancreatitis (HCC)    Cigarette smoker    CKD (chronic kidney disease) stage 3, GFR 30-59 ml/min (HCC)    COPD (chronic obstructive pulmonary disease) (HCC)    Crack cocaine use    as of 01-21-24, pt states he last used a few weeks ago   Depression    DM (diabetes mellitus), type 2 (HCC)    Dyspnea    ED (erectile dysfunction)    ESBL (extended spectrum beta-lactamase) producing bacteria infection 2021   GERD (gastroesophageal reflux disease)    Hyperlipidemia    Hypertension    ILD (interstitial lung disease) (HCC)    Marijuana use    Osteoarthritis    Pneumonia    PTSD (post-traumatic stress disorder)    Seizure (HCC)    x1 years ago   Viral infection of left eye      Past Surgical History:  Procedure Laterality Date   BRONCHOSCOPY, WITH BIOPSY USING ELECTROMAGNETIC NAVIGATION Left 01/24/2024   Procedure: BRONCHOSCOPY, WITH BIOPSY USING ELECTROMAGNETIC NAVIGATION;  Surgeon: Isaiah Scrivener, MD;  Location: [facility] ORS;  Service: Pulmonary;  Laterality:  Left;   ENDOBRONCHIAL ULTRASOUND Right 01/24/2024   Procedure: ENDOBRONCHIAL ULTRASOUND (EBUS);  Surgeon: Kasa, Kurian, MD;  Location: [facility] ORS;  Service: Pulmonary;  Laterality: Right;   IR IMAGING GUIDED PORT INSERTION  03/15/2024   pancreatic stent placement      Social History   Socioeconomic History   Marital status: Legally Separated    Spouse name: Not on file   Number of children: 2   Years of education: Not on file   Highest education level: High school graduate  Occupational History   Occupation: on disability  Tobacco Use   Smoking status: Former    Current packs/day: 3.00    Average packs/day: 3.0 packs/day for 15.0 years (45.0 ttl pk-yrs)    Types: Cigarettes   Smokeless tobacco: Never   Tobacco comments:    At his heaviest smoked 3ppd and now occasionally smoke 1 cigarette. 02/01/24  Vaping Use   Vaping status: Some Days  Substance and Sexual Activity   Alcohol  use: Yes    Alcohol /week: 7.0 standard drinks of alcohol     Types: 7 Cans of beer per week    Comment: occ   Drug use: Yes    Types: Marijuana,  Crack cocaine    Comment: Marijuana every day. 7-57yrs clean of crack cocaine.   Sexual activity: Yes    Birth control/protection: Condom  Other Topics Concern   Not on file  Social History Narrative   Not on file   Social Drivers of Health   Financial Resource Strain: Not on file  Food Insecurity: No Food Insecurity (01/31/2024)   Hunger Vital Sign    Worried About Running Out of Food in the Last Year: Never true    Ran Out of Food in the Last Year: Never true  Recent Concern: Food Insecurity - Food Insecurity Present (01/15/2024)   Hunger Vital Sign    Worried About Running Out of Food in the Last Year: Sometimes true    Ran Out of Food in the Last Year: Sometimes true  Transportation Needs: No Transportation Needs (01/31/2024)   PRAPARE - Administrator, Civil Service (Medical): No    Lack of Transportation (Non-Medical): No  Physical  Activity: Not on file  Stress: Not on file  Social Connections: Not on file  Intimate Partner Violence: Not At Risk (01/31/2024)   Humiliation, Afraid, Rape, and Kick questionnaire    Fear of Current or Ex-Partner: No    Emotionally Abused: No    Physically Abused: No    Sexually Abused: No    Family History  Problem Relation Age of Onset   Diabetes Mother    Sleep apnea Mother    Emphysema Sister    CAD Neg Hx    Mental illness Neg Hx      Current Outpatient Medications:    Accu-Chek Softclix Lancets lancets, Use with blood glucose monitor in the morning, at noon, and at bedtime. May substitute to any manufacturer covered by patient's insurance., Disp: 100 each, Rfl: 0   amLODipine -olmesartan  (AZOR ) 10-40 MG tablet, Take 1 tablet by mouth daily., Disp: 90 tablet, Rfl: 1   Blood Glucose Monitoring Suppl (BLOOD GLUCOSE MONITOR SYSTEM) w/Device KIT, Check blood sugar in the morning, at noon, and at bedtime. May substitute to any manufacturer covered by patient's insurance., Disp: 1 kit, Rfl: 0   dexamethasone  (DECADRON ) 4 MG tablet, Take 2 tablets (8 mg total) by mouth daily. Take for 1 day starting the day after chemotherapy on day 4. Take with food., Disp: 30 tablet, Rfl: 1   empagliflozin  (JARDIANCE ) 10 MG TABS tablet, Take 1 tablet (10 mg total) by mouth daily before breakfast., Disp: 90 tablet, Rfl: 1   lidocaine -prilocaine  (EMLA ) cream, Apply to affected area once, Disp: 30 g, Rfl: 3   lovastatin  (MEVACOR ) 10 MG tablet, Take 1 tablet (10 mg total) by mouth at bedtime., Disp: 90 tablet, Rfl: 1   metFORMIN  (GLUCOPHAGE ) 500 MG tablet, Take 1 tablet (500 mg total) by mouth daily with breakfast., Disp: 90 tablet, Rfl: 1   Nebulizers (COMPRESSOR/NEBULIZER) MISC, Nebulizer and tubes/mask to use as directed with DuoNeb as needed, Disp: 1 each, Rfl: 0   omeprazole  (PRILOSEC) 20 MG capsule, Take 1 capsule (20 mg total) by mouth daily., Disp: 90 capsule, Rfl: 1   ondansetron  (ZOFRAN ) 8 MG  tablet, Take 1 tablet (8 mg total) by mouth every 8 (eight) hours as needed for nausea or vomiting. Start on the third day after cisplatin ., Disp: 30 tablet, Rfl: 1   Pancrelipase , Lip-Prot-Amyl, (ZENPEP ) 15000-47000 units CPEP, Take 1 capsule (15,000 Units total) by mouth 3 (three) times daily., Disp: 270 capsule, Rfl: 1   prochlorperazine  (COMPAZINE ) 10 MG tablet, Take 1 tablet (  10 mg total) by mouth every 6 (six) hours as needed for nausea or vomiting., Disp: 30 tablet, Rfl: 1   sertraline  (ZOLOFT ) 100 MG tablet, Take 1 tablet (100 mg total) by mouth daily., Disp: 90 tablet, Rfl: 0   spironolactone  (ALDACTONE ) 25 MG tablet, Take 1 tablet (25 mg total) by mouth daily., Disp: 90 tablet, Rfl: 3   traZODone  (DESYREL ) 150 MG tablet, Take 1 tablet (150 mg total) by mouth at bedtime as needed for sleep., Disp: 90 tablet, Rfl: 1   VENTOLIN  HFA 108 (90 Base) MCG/ACT inhaler, Inhale 1-2 puffs into the lungs every 6 (six) hours as needed for wheezing or shortness of breath (or coughing)., Disp: 18 g, Rfl: 6   ipratropium-albuterol  (DUONEB) 0.5-2.5 (3) MG/3ML SOLN, Take 3 mLs by nebulization every 2 (two) hours as needed (wheeze, SOB). Can use instead of albuterol  inhaler (Patient not taking: Reported on 03/31/2024), Disp: 60 mL, Rfl: 1   naproxen  (NAPROSYN ) 250 MG tablet, Take 250 mg by mouth 2 (two) times daily with a meal. (Patient not taking: Reported on 04/11/2024), Disp: , Rfl:    nicotine  (NICODERM CQ  - DOSED IN MG/24 HOURS) 21 mg/24hr patch, Place 1 patch (21 mg total) onto the skin daily. (Patient not taking: Reported on 04/11/2024), Disp: 28 patch, Rfl: 0   OXYGEN , Inhale 2 L into the lungs as needed., Disp: , Rfl:    sucralfate  (CARAFATE ) 1 g tablet, Take 1 tablet (1 g total) by mouth 2 (two) times daily. Dissolve tablet in 4 T warm water  swish and swallow. (Patient not taking: Reported on 04/11/2024), Disp: 60 tablet, Rfl: 1  Physical exam: There were no vitals filed for this visit. Physical  Exam Constitutional:      Comments: Sitting in a wheelchair.  Appears in no acute distress  Cardiovascular:     Rate and Rhythm: Normal rate and regular rhythm.     Heart sounds: Normal heart sounds.  Pulmonary:     Effort: Pulmonary effort is normal.     Breath sounds: Normal breath sounds.  Abdominal:     General: Bowel sounds are normal.     Palpations: Abdomen is soft.  Skin:    General: Skin is warm and dry.  Neurological:     Mental Status: He is alert and oriented to person, place, and time.      I have personally reviewed labs listed below:    Latest Ref Rng & Units 04/24/2024    8:34 AM  CMP  Glucose 70 - 99 mg/dL 814   BUN 6 - 20 mg/dL 16   Creatinine 9.38 - 1.24 mg/dL 8.80   Sodium 864 - 854 mmol/L 132   Potassium 3.5 - 5.1 mmol/L 4.0   Chloride 98 - 111 mmol/L 99   CO2 22 - 32 mmol/L 22   Calcium  8.9 - 10.3 mg/dL 9.0   Total Protein 6.5 - 8.1 g/dL 7.9   Total Bilirubin 0.0 - 1.2 mg/dL 0.3   Alkaline Phos 38 - 126 U/L 97   AST 15 - 41 U/L 24   ALT 0 - 44 U/L 11       Latest Ref Rng & Units 04/24/2024    8:34 AM  CBC  WBC 4.0 - 10.5 K/uL 4.6   Hemoglobin 13.0 - 17.0 g/dL 89.7   Hematocrit 60.9 - 52.0 % 31.0   Platelets 150 - 400 K/uL 331       Assessment and plan- Patient is a 58 y.o. male with  history of newly diagnosed limited stage small cell lung cancer and possible left humeral metastases.  He is here for on treatment assessment prior to cycle 3 of cisplatin  etoposide  chemotherapy  Counts okay to proceed with cycle 3 of cisplatin  etoposide  chemotherapy today.Day after as well.Done 2 days tomorrow and he is still receiving ongoing radiation which will go on until 05/04/2024 and therefore I am unable to give him Neulasta along with chemotherapy.  I will see him back in 3 weeks for cycle 4 of chemotherapy.  I will consider giving him Neulasta with cycle 4 of treatment since he would have completed radiation by then.  Plan to repeat PET scan in about 4 to 5  weeks time.  Patient had an isolated area of left humeral metastases for which she is going to be receiving palliative radiation.  If PET scan shows partial response/stable disease I will start maintenance Tecentriq at that time.  I was not able to offer that along with chemotherapy due to concurrent chemoradiation.   Visit Diagnosis 1. Encounter for antineoplastic chemotherapy   2. Small cell lung cancer, left upper lobe (HCC)      Dr. Annah Skene, MD, MPH [facility] [phone] 04/24/2024 9:21 AM

## 2024-04-24 NOTE — Progress Notes (Signed)
 Patient has no new or acute concerns at this time.

## 2024-04-25 ENCOUNTER — Inpatient Hospital Stay

## 2024-04-25 ENCOUNTER — Ambulatory Visit
Admission: RE | Admit: 2024-04-25 | Discharge: 2024-04-25 | Disposition: A | Discharge status: Home / Self Care | Source: Ambulatory Visit | Attending: Radiation Oncology | Admitting: Radiation Oncology

## 2024-04-25 ENCOUNTER — Other Ambulatory Visit: Payer: Self-pay

## 2024-04-25 ENCOUNTER — Encounter: Payer: Self-pay | Admitting: Oncology

## 2024-04-25 VITALS — BP 152/86 | HR 91 | Temp 97.8°F | Resp 18

## 2024-04-25 DIAGNOSIS — Z510 Encounter for antineoplastic radiation therapy: Secondary | ICD-10-CM | POA: Diagnosis not present

## 2024-04-25 DIAGNOSIS — C3412 Malignant neoplasm of upper lobe, left bronchus or lung: Secondary | ICD-10-CM | POA: Diagnosis not present

## 2024-04-25 DIAGNOSIS — Z87891 Personal history of nicotine dependence: Secondary | ICD-10-CM | POA: Diagnosis not present

## 2024-04-25 LAB — RAD ONC ARIA SESSION SUMMARY
Course Elapsed Days: 35
Plan Fractions Treated to Date: 23
Plan Prescribed Dose Per Fraction: 2 Gy
Plan Total Fractions Prescribed: 30
Plan Total Prescribed Dose: 60 Gy
Reference Point Dosage Given to Date: 46 Gy
Reference Point Session Dosage Given: 2 Gy
Session Number: 23

## 2024-04-25 MED ORDER — SODIUM CHLORIDE 0.9 % IV SOLN
100.0000 mg/m2 | Freq: Once | INTRAVENOUS | Status: AC
  Administered 2024-04-25: 200 mg via INTRAVENOUS
  Filled 2024-04-25: qty 10

## 2024-04-25 MED ORDER — SODIUM CHLORIDE 0.9 % IV SOLN
INTRAVENOUS | Status: DC
  Filled 2024-04-25: qty 250

## 2024-04-25 MED ORDER — HEPARIN SOD (PORK) LOCK FLUSH 100 UNIT/ML IV SOLN
500.0000 [IU] | Freq: Once | INTRAVENOUS | Status: DC | PRN
  Filled 2024-04-25: qty 5

## 2024-04-25 MED ORDER — DEXAMETHASONE SODIUM PHOSPHATE 10 MG/ML IJ SOLN
10.0000 mg | Freq: Once | INTRAMUSCULAR | Status: AC
  Administered 2024-04-25: 10 mg via INTRAVENOUS
  Filled 2024-04-25: qty 1

## 2024-04-25 NOTE — Patient Instructions (Signed)
 [facility] - A DEPT OF [facility]  Discharge Instructions: Thank you for choosing [facility] to provide your oncology and hematology care.  If you have a lab appointment with the Cancer Center, please go directly to the Cancer Center and check in at the registration area.  Wear comfortable clothing and clothing appropriate for easy access to any Portacath or PICC line.   We strive to give you quality time with your provider. You may need to reschedule your appointment if you arrive late (15 or more minutes).  Arriving late affects you and other patients whose appointments are after yours.  Also, if you miss three or more appointments without notifying the office, you may be dismissed from the clinic at the provider's discretion.      For prescription refill requests, have your pharmacy contact our office and allow 72 hours for refills to be completed.    Today you received the following chemotherapy and/or immunotherapy agents Etoposide      To help prevent nausea and vomiting after your treatment, we encourage you to take your nausea medication as directed.  BELOW ARE SYMPTOMS THAT SHOULD BE REPORTED IMMEDIATELY: *FEVER GREATER THAN 100.4 F (38 C) OR HIGHER *CHILLS OR SWEATING *NAUSEA AND VOMITING THAT IS NOT CONTROLLED WITH YOUR NAUSEA MEDICATION *UNUSUAL SHORTNESS OF BREATH *UNUSUAL BRUISING OR BLEEDING *URINARY PROBLEMS (pain or burning when urinating, or frequent urination) *BOWEL PROBLEMS (unusual diarrhea, constipation, pain near the anus) TENDERNESS IN MOUTH AND THROAT WITH OR WITHOUT PRESENCE OF ULCERS (sore throat, sores in mouth, or a toothache) UNUSUAL RASH, SWELLING OR PAIN  UNUSUAL VAGINAL DISCHARGE OR ITCHING   Items with * indicate a potential emergency and should be followed up as soon as possible or go to the Emergency Department if any problems should occur.  Please show the CHEMOTHERAPY ALERT CARD or IMMUNOTHERAPY  ALERT CARD at check-in to the Emergency Department and triage nurse.  Should you have questions after your visit or need to cancel or reschedule your appointment, please contact [facility] - A DEPT OF [facility]  [phone] and follow the prompts.  Office hours are 8:00 a.m. to 4:30 p.m. Monday - Friday. Please note that voicemails left after 4:00 p.m. may not be returned until the following business day.  We are closed weekends and major holidays. You have access to a nurse at all times for urgent questions. Please call the main number to the clinic [phone] and follow the prompts.  For any non-urgent questions, you may also contact your provider using MyChart. We now offer e-Visits for anyone 36 and older to request care online for non-urgent symptoms. For details visit [email or web address].   Also download the MyChart app! Go to the app store, search "MyChart", open the app, select [facility], and log in with your MyChart username and password.

## 2024-04-26 ENCOUNTER — Other Ambulatory Visit: Payer: Self-pay

## 2024-04-26 ENCOUNTER — Inpatient Hospital Stay

## 2024-04-26 ENCOUNTER — Ambulatory Visit
Admission: RE | Admit: 2024-04-26 | Discharge: 2024-04-26 | Disposition: A | Discharge status: Home / Self Care | Source: Ambulatory Visit | Attending: Radiation Oncology | Admitting: Radiation Oncology

## 2024-04-26 VITALS — BP 127/84 | HR 80 | Temp 97.5°F | Resp 18

## 2024-04-26 DIAGNOSIS — C3412 Malignant neoplasm of upper lobe, left bronchus or lung: Secondary | ICD-10-CM | POA: Diagnosis not present

## 2024-04-26 DIAGNOSIS — Z510 Encounter for antineoplastic radiation therapy: Secondary | ICD-10-CM | POA: Diagnosis not present

## 2024-04-26 DIAGNOSIS — Z87891 Personal history of nicotine dependence: Secondary | ICD-10-CM | POA: Diagnosis not present

## 2024-04-26 LAB — RAD ONC ARIA SESSION SUMMARY
Course Elapsed Days: 36
Plan Fractions Treated to Date: 24
Plan Prescribed Dose Per Fraction: 2 Gy
Plan Total Fractions Prescribed: 30
Plan Total Prescribed Dose: 60 Gy
Reference Point Dosage Given to Date: 48 Gy
Reference Point Session Dosage Given: 2 Gy
Session Number: 24

## 2024-04-26 MED ORDER — DEXAMETHASONE SODIUM PHOSPHATE 10 MG/ML IJ SOLN
10.0000 mg | Freq: Once | INTRAMUSCULAR | Status: AC
  Administered 2024-04-26: 10 mg via INTRAVENOUS
  Filled 2024-04-26: qty 1

## 2024-04-26 MED ORDER — SODIUM CHLORIDE 0.9 % IV SOLN
100.0000 mg/m2 | Freq: Once | INTRAVENOUS | Status: AC
  Administered 2024-04-26: 200 mg via INTRAVENOUS
  Filled 2024-04-26: qty 10

## 2024-04-26 MED ORDER — SODIUM CHLORIDE 0.9 % IV SOLN
INTRAVENOUS | Status: DC
  Filled 2024-04-26 (×2): qty 250

## 2024-04-27 ENCOUNTER — Ambulatory Visit
Admission: RE | Admit: 2024-04-27 | Discharge: 2024-04-27 | Disposition: A | Discharge status: Home / Self Care | Source: Ambulatory Visit | Attending: Radiation Oncology | Admitting: Radiation Oncology

## 2024-04-27 ENCOUNTER — Other Ambulatory Visit: Payer: Self-pay

## 2024-04-27 DIAGNOSIS — C3412 Malignant neoplasm of upper lobe, left bronchus or lung: Secondary | ICD-10-CM | POA: Diagnosis not present

## 2024-04-27 DIAGNOSIS — Z87891 Personal history of nicotine dependence: Secondary | ICD-10-CM | POA: Diagnosis not present

## 2024-04-27 DIAGNOSIS — Z510 Encounter for antineoplastic radiation therapy: Secondary | ICD-10-CM | POA: Diagnosis not present

## 2024-04-27 LAB — RAD ONC ARIA SESSION SUMMARY
Course Elapsed Days: 37
Plan Fractions Treated to Date: 25
Plan Prescribed Dose Per Fraction: 2 Gy
Plan Total Fractions Prescribed: 30
Plan Total Prescribed Dose: 60 Gy
Reference Point Dosage Given to Date: 50 Gy
Reference Point Session Dosage Given: 2 Gy
Session Number: 25

## 2024-04-28 ENCOUNTER — Ambulatory Visit
Admission: RE | Admit: 2024-04-28 | Discharge: 2024-04-28 | Disposition: A | Discharge status: Home / Self Care | Source: Ambulatory Visit | Attending: Radiation Oncology | Admitting: Radiation Oncology

## 2024-04-28 ENCOUNTER — Other Ambulatory Visit: Payer: Self-pay

## 2024-04-28 DIAGNOSIS — Z87891 Personal history of nicotine dependence: Secondary | ICD-10-CM | POA: Diagnosis not present

## 2024-04-28 DIAGNOSIS — Z510 Encounter for antineoplastic radiation therapy: Secondary | ICD-10-CM | POA: Diagnosis not present

## 2024-04-28 DIAGNOSIS — C3412 Malignant neoplasm of upper lobe, left bronchus or lung: Secondary | ICD-10-CM | POA: Diagnosis not present

## 2024-04-28 LAB — RAD ONC ARIA SESSION SUMMARY
Course Elapsed Days: 38
Plan Fractions Treated to Date: 26
Plan Prescribed Dose Per Fraction: 2 Gy
Plan Total Fractions Prescribed: 30
Plan Total Prescribed Dose: 60 Gy
Reference Point Dosage Given to Date: 52 Gy
Reference Point Session Dosage Given: 2 Gy
Session Number: 26

## 2024-05-01 ENCOUNTER — Other Ambulatory Visit: Payer: Self-pay

## 2024-05-01 ENCOUNTER — Ambulatory Visit
Admission: RE | Admit: 2024-05-01 | Discharge: 2024-05-01 | Disposition: A | Discharge status: Home / Self Care | Source: Ambulatory Visit | Attending: Radiation Oncology | Admitting: Radiation Oncology

## 2024-05-01 DIAGNOSIS — C3412 Malignant neoplasm of upper lobe, left bronchus or lung: Secondary | ICD-10-CM | POA: Diagnosis not present

## 2024-05-01 DIAGNOSIS — Z87891 Personal history of nicotine dependence: Secondary | ICD-10-CM | POA: Diagnosis not present

## 2024-05-01 DIAGNOSIS — Z510 Encounter for antineoplastic radiation therapy: Secondary | ICD-10-CM | POA: Diagnosis not present

## 2024-05-01 LAB — RAD ONC ARIA SESSION SUMMARY
Course Elapsed Days: 41
Plan Fractions Treated to Date: 27
Plan Prescribed Dose Per Fraction: 2 Gy
Plan Total Fractions Prescribed: 30
Plan Total Prescribed Dose: 60 Gy
Reference Point Dosage Given to Date: 54 Gy
Reference Point Session Dosage Given: 2 Gy
Session Number: 27

## 2024-05-02 ENCOUNTER — Ambulatory Visit
Admission: RE | Admit: 2024-05-02 | Discharge: 2024-05-02 | Disposition: A | Discharge status: Home / Self Care | Source: Ambulatory Visit | Attending: Radiation Oncology | Admitting: Radiation Oncology

## 2024-05-02 ENCOUNTER — Other Ambulatory Visit: Payer: Self-pay

## 2024-05-02 ENCOUNTER — Ambulatory Visit

## 2024-05-02 DIAGNOSIS — C3412 Malignant neoplasm of upper lobe, left bronchus or lung: Secondary | ICD-10-CM | POA: Diagnosis not present

## 2024-05-02 DIAGNOSIS — Z87891 Personal history of nicotine dependence: Secondary | ICD-10-CM | POA: Diagnosis not present

## 2024-05-02 DIAGNOSIS — Z510 Encounter for antineoplastic radiation therapy: Secondary | ICD-10-CM | POA: Diagnosis not present

## 2024-05-02 LAB — RAD ONC ARIA SESSION SUMMARY
Course Elapsed Days: 42
Plan Fractions Treated to Date: 28
Plan Prescribed Dose Per Fraction: 2 Gy
Plan Total Fractions Prescribed: 30
Plan Total Prescribed Dose: 60 Gy
Reference Point Dosage Given to Date: 56 Gy
Reference Point Session Dosage Given: 2 Gy
Session Number: 28

## 2024-05-03 ENCOUNTER — Ambulatory Visit
Admission: RE | Admit: 2024-05-03 | Discharge: 2024-05-03 | Disposition: A | Discharge status: Home / Self Care | Source: Ambulatory Visit | Attending: Radiation Oncology | Admitting: Radiation Oncology

## 2024-05-03 ENCOUNTER — Other Ambulatory Visit: Payer: Self-pay

## 2024-05-03 ENCOUNTER — Encounter: Payer: Self-pay | Admitting: Internal Medicine

## 2024-05-03 ENCOUNTER — Ambulatory Visit: Admitting: Internal Medicine

## 2024-05-03 VITALS — BP 120/80 | HR 83 | Temp 100.2°F | Ht 67.0 in | Wt 167.0 lb

## 2024-05-03 DIAGNOSIS — J449 Chronic obstructive pulmonary disease, unspecified: Secondary | ICD-10-CM | POA: Diagnosis not present

## 2024-05-03 DIAGNOSIS — C3490 Malignant neoplasm of unspecified part of unspecified bronchus or lung: Secondary | ICD-10-CM

## 2024-05-03 DIAGNOSIS — C3412 Malignant neoplasm of upper lobe, left bronchus or lung: Secondary | ICD-10-CM

## 2024-05-03 DIAGNOSIS — Z87891 Personal history of nicotine dependence: Secondary | ICD-10-CM | POA: Diagnosis not present

## 2024-05-03 DIAGNOSIS — Z510 Encounter for antineoplastic radiation therapy: Secondary | ICD-10-CM | POA: Diagnosis not present

## 2024-05-03 LAB — RAD ONC ARIA SESSION SUMMARY
Course Elapsed Days: 43
Plan Fractions Treated to Date: 29
Plan Prescribed Dose Per Fraction: 2 Gy
Plan Total Fractions Prescribed: 30
Plan Total Prescribed Dose: 60 Gy
Reference Point Dosage Given to Date: 58 Gy
Reference Point Session Dosage Given: 2 Gy
Session Number: 29

## 2024-05-03 NOTE — Patient Instructions (Signed)
 CONGRATULATIONS!!!!   YOU STOPPED SMOKING!!!  Use your inhalers and nebulizers as needed Follow-up with oncology as scheduled

## 2024-05-03 NOTE — Progress Notes (Unsigned)
 [facility] [place] Pulmonary Medicine Consultation      Date: 05/03/2024,   MRN# [id number] Byrl Latin [birth year]   CHIEF COMPLAINT:  Follow-up assessment stage IIIb lung cancer CT chest showed  3.7 x 1.8 cm lobulated mass in the left upper lobe abutting the chest wall, Status post navigation bronchoscopy June 2025  Follow-up assessment for COPD HISTORY OF PRESENT ILLNESS   Follow up lung cancer  Small cell lung cancer, left upper lobe (HCC) Clinical stage from 02/21/2024: Stage IIIB (cT2, cN2b, cM0) cycle 3 of cisplatin  etoposide  chemotherapy  Undergoing radiation therapy  No exacerbation at this time No evidence of heart failure at this time No evidence or signs of infection at this time No respiratory distress No fevers, chills, nausea, vomiting, diarrhea No evidence of lower extremity edema No evidence hemoptysis  Patient no longer taking inhalers Patient without any respiratory issues at this time Feels well overall   PAST MEDICAL HISTORY   Past Medical History:  Diagnosis Date   Alcohol  use disorder    Aortic atherosclerosis (HCC)    Bronchiolo-alveolar adenocarcinoma of left lung (HCC)    CHF (congestive heart failure) (HCC)    Chronic hip pain    Chronic pancreatitis (HCC)    Cigarette smoker    CKD (chronic kidney disease) stage 3, GFR 30-59 ml/min (HCC)    COPD (chronic obstructive pulmonary disease) (HCC)    Crack cocaine use    as of 01-21-24, pt states he last used a few weeks ago   Depression    DM (diabetes mellitus), type 2 (HCC)    Dyspnea    ED (erectile dysfunction)    ESBL (extended spectrum beta-lactamase) producing bacteria infection 2021   GERD (gastroesophageal reflux disease)    Hyperlipidemia    Hypertension    ILD (interstitial lung disease) (HCC)    Marijuana use    Osteoarthritis    Pneumonia    PTSD (post-traumatic stress disorder)    Seizure (HCC)    x1 years ago   Viral infection of left eye      SURGICAL HISTORY   Past  Surgical History:  Procedure Laterality Date   BRONCHOSCOPY, WITH BIOPSY USING ELECTROMAGNETIC NAVIGATION Left 01/24/2024   Procedure: BRONCHOSCOPY, WITH BIOPSY USING ELECTROMAGNETIC NAVIGATION;  Surgeon: Isaiah Scrivener, MD;  Location: [facility];  Service: Pulmonary;  Laterality: Left;   ENDOBRONCHIAL ULTRASOUND Right 01/24/2024   Procedure: ENDOBRONCHIAL ULTRASOUND (EBUS);  Surgeon: Yong Grieser, MD;  Location: [facility];  Service: Pulmonary;  Laterality: Right;   IR IMAGING GUIDED PORT INSERTION  03/15/2024   pancreatic stent placement       FAMILY HISTORY   Family History  Problem Relation Age of Onset   Diabetes Mother    Sleep apnea Mother    Emphysema Sister    CAD Neg Hx    Mental illness Neg Hx      SOCIAL HISTORY   Social History   Tobacco Use   Smoking status: Former    Current packs/day: 3.00    Average packs/day: 3.0 packs/day for 15.0 years (45.0 ttl pk-yrs)    Types: Cigarettes   Smokeless tobacco: Never   Tobacco comments:    At his heaviest smoked 3ppd and now occasionally smoke 1 cigarette. 02/01/24  Vaping Use   Vaping status: Some Days  Substance Use Topics   Alcohol  use: Yes    Alcohol /week: 7.0 standard drinks of alcohol     Types: 7 Cans of beer per week    Comment: occ  Drug use: Yes    Types: Marijuana, Crack cocaine    Comment: Marijuana every day. 7-10yrs clean of crack cocaine.     MEDICATIONS    Home Medication:  Current Outpatient Rx   Order #: [id number] Class: Normal   Order #: [id number] Class: Normal   Order #: [id number] Class: Normal   Order #: [id number] Class: Normal   Order #: [id number] Class: Normal   Order #: [id number] Class: Normal   Order #: [id number] Class: Normal   Order #: [id number] Class: Normal   Order #: [id number] Class: Normal   Order #: [id number] Class: Historical Med   Order #: [id number] Class: Normal   Order #: [id number] Class: OTC   Order #: [id number] Class: Normal   Order #: [id number] Class: Normal   Order #:  [id number] Class: Historical Med   Order #: [id number] Class: Normal   Order #: [id number] Class: Normal   Order #: [id number] Class: Normal   Order #: [id number] Class: Normal   Order #: [id number] Class: Normal   Order #: [id number] Class: Normal   Order #: [id number] Class: Normal    Current Medication:  Current Outpatient Medications:    Accu-Chek Softclix Lancets lancets, Use with blood glucose monitor in the morning, at noon, and at bedtime. May substitute to any manufacturer covered by patient's insurance., Disp: 100 each, Rfl: 0   amLODipine -olmesartan  (AZOR ) 10-40 MG tablet, Take 1 tablet by mouth daily., Disp: 90 tablet, Rfl: 1   Blood Glucose Monitoring Suppl (BLOOD GLUCOSE MONITOR SYSTEM) w/Device KIT, Check blood sugar in the morning, at noon, and at bedtime. May substitute to any manufacturer covered by patient's insurance., Disp: 1 kit, Rfl: 0   dexamethasone  (DECADRON ) 4 MG tablet, Take 2 tablets (8 mg total) by mouth daily. Take for 1 day starting the day after chemotherapy on day 4. Take with food., Disp: 30 tablet, Rfl: 1   empagliflozin  (JARDIANCE ) 10 MG TABS tablet, Take 1 tablet (10 mg total) by mouth daily before breakfast., Disp: 90 tablet, Rfl: 1   ipratropium-albuterol  (DUONEB) 0.5-2.5 (3) MG/3ML SOLN, Take 3 mLs by nebulization every 2 (two) hours as needed (wheeze, SOB). Can use instead of albuterol  inhaler (Patient not taking: Reported on 03/31/2024), Disp: 60 mL, Rfl: 1   lidocaine -prilocaine  (EMLA ) cream, Apply to affected area once, Disp: 30 g, Rfl: 3   lovastatin  (MEVACOR ) 10 MG tablet, Take 1 tablet (10 mg total) by mouth at bedtime., Disp: 90 tablet, Rfl: 1   metFORMIN  (GLUCOPHAGE ) 500 MG tablet, Take 1 tablet (500 mg total) by mouth daily with breakfast., Disp: 90 tablet, Rfl: 1   naproxen  (NAPROSYN ) 250 MG tablet, Take 250 mg by mouth 2 (two) times daily with a meal. (Patient not taking: Reported on 04/11/2024), Disp: , Rfl:    Nebulizers (COMPRESSOR/NEBULIZER) MISC,  Nebulizer and tubes/mask to use as directed with DuoNeb as needed, Disp: 1 each, Rfl: 0   nicotine  (NICODERM CQ  - DOSED IN MG/24 HOURS) 21 mg/24hr patch, Place 1 patch (21 mg total) onto the skin daily. (Patient not taking: Reported on 04/11/2024), Disp: 28 patch, Rfl: 0   omeprazole  (PRILOSEC) 20 MG capsule, Take 1 capsule (20 mg total) by mouth daily., Disp: 90 capsule, Rfl: 1   ondansetron  (ZOFRAN ) 8 MG tablet, Take 1 tablet (8 mg total) by mouth every 8 (eight) hours as needed for nausea or vomiting. Start on the third day after cisplatin ., Disp: 30 tablet, Rfl: 1   OXYGEN , Inhale 2 L into the lungs as needed., Disp: , Rfl:    Pancrelipase , Lip-Prot-Amyl, (ZENPEP ) 15000-47000 units CPEP, Take 1 capsule (15,000 Units total) by  mouth 3 (three) times daily., Disp: 270 capsule, Rfl: 1   prochlorperazine  (COMPAZINE ) 10 MG tablet, Take 1 tablet (10 mg total) by mouth every 6 (six) hours as needed for nausea or vomiting., Disp: 30 tablet, Rfl: 1   sertraline  (ZOLOFT ) 100 MG tablet, Take 1 tablet (100 mg total) by mouth daily., Disp: 90 tablet, Rfl: 0   spironolactone  (ALDACTONE ) 25 MG tablet, Take 1 tablet (25 mg total) by mouth daily., Disp: 90 tablet, Rfl: 3   sucralfate  (CARAFATE ) 1 g tablet, Take 1 tablet (1 g total) by mouth 2 (two) times daily. Dissolve tablet in 4 T warm water  swish and swallow. (Patient not taking: Reported on 04/11/2024), Disp: 60 tablet, Rfl: 1   traZODone  (DESYREL ) 150 MG tablet, Take 1 tablet (150 mg total) by mouth at bedtime as needed for sleep., Disp: 90 tablet, Rfl: 1   VENTOLIN  HFA 108 (90 Base) MCG/ACT inhaler, Inhale 1-2 puffs into the lungs every 6 (six) hours as needed for wheezing or shortness of breath (or coughing)., Disp: 18 g, Rfl: 6    ALLERGIES   Hctz [hydrochlorothiazide] and Ibuprofen  BP 120/80   Pulse 83   Temp 100.2 F (37.9 C) Comment: just had a hot cup of coffee.  Ht 5' 7 (1.702 m)   Wt 167 lb (75.8 kg)   SpO2 96%   BMI 26.16 kg/m       Review of Systems: Gen:  Denies  fever, sweats, chills weight loss  HEENT: Denies blurred vision, double vision, ear pain, eye pain, hearing loss, nose bleeds, sore throat Cardiac:  No dizziness, chest pain or heaviness, chest tightness,edema, No JVD Resp:   No cough, -sputum production, -shortness of breath,-wheezing, -hemoptysis,  Other:  All other systems negative   Physical Examination:   General Appearance: No distress  EYES PERRLA, EOM intact.   NECK Supple, No JVD Pulmonary: normal breath sounds, No wheezing.  CardiovascularNormal S1,S2.  No m/r/g.   Abdomen: Benign, Soft, non-tender. Neurology UE/LE 5/5 strength, no focal deficits Ext pulses intact, cap refill intact ALL OTHER ROS ARE NEGATIVE        ASSESSMENT/PLAN   58 year old pleasant African-American male with newly diagnosis of small cell lung cancer with underlying COPD extensive smoking history  Assessment of COPD Stable at this time No indication for antibiotics or prednisone  No indication for oxygen  at this time Uses albuterol  nebulizers as needed No maintenance therapy needed at this time Avoid Allergens and Irritants Avoid secondhand smoke Avoid SICK contacts Recommend  Masking  when appropriate Recommend Keep up-to-date with vaccinations' No exacerbation at this time No evidence of heart failure at this time No evidence or signs of infection at this time No respiratory distress No fevers, chills, nausea, vomiting, diarrhea No evidence of lower extremity edema No evidence hemoptysis   Small cell lung cancer Follow-up with oncology    CURRENT MEDICATIONS REVIEWED AT LENGTH WITH PATIENT TODAY   Patient  satisfied with Plan of action and management. All questions answered   Follow up 6 months   I spent a total of 40 minutes dedicated to the care of this patient on the date of this encounter to include pre-visit review of records, face-to-face time with the patient discussing  conditions above, post visit ordering of testing, clinical documentation with the electronic health record, making appropriate referrals as documented, and communicating necessary information to the patient's healthcare team.    The Patient requires high complexity decision making for assessment and support, frequent evaluation and  titration of therapies, application of advanced monitoring technologies and extensive interpretation of multiple databases.  Patient satisfied with Plan of action and management. All questions answered    Nickolas Alm Cellar, M.D.  [facility]  Medical Director [facility] Medical Director [facility] Cardio-Pulmonary Department

## 2024-05-04 ENCOUNTER — Ambulatory Visit
Admission: RE | Admit: 2024-05-04 | Discharge: 2024-05-04 | Disposition: A | Discharge status: Home / Self Care | Source: Ambulatory Visit | Attending: Radiation Oncology | Admitting: Radiation Oncology

## 2024-05-04 ENCOUNTER — Other Ambulatory Visit: Payer: Self-pay

## 2024-05-04 DIAGNOSIS — Z87891 Personal history of nicotine dependence: Secondary | ICD-10-CM | POA: Diagnosis not present

## 2024-05-04 DIAGNOSIS — C3412 Malignant neoplasm of upper lobe, left bronchus or lung: Secondary | ICD-10-CM | POA: Diagnosis not present

## 2024-05-04 DIAGNOSIS — Z510 Encounter for antineoplastic radiation therapy: Secondary | ICD-10-CM | POA: Diagnosis not present

## 2024-05-04 LAB — RAD ONC ARIA SESSION SUMMARY
Course Elapsed Days: 44
Plan Fractions Treated to Date: 30
Plan Prescribed Dose Per Fraction: 2 Gy
Plan Total Fractions Prescribed: 30
Plan Total Prescribed Dose: 60 Gy
Reference Point Dosage Given to Date: 60 Gy
Reference Point Session Dosage Given: 2 Gy
Session Number: 30

## 2024-05-09 ENCOUNTER — Encounter: Payer: Self-pay | Admitting: Oncology

## 2024-05-09 ENCOUNTER — Other Ambulatory Visit: Payer: Self-pay | Admitting: Internal Medicine

## 2024-05-10 NOTE — Telephone Encounter (Signed)
 Requested Prescriptions  Pending Prescriptions Disp Refills   sertraline  (ZOLOFT ) 100 MG tablet [Pharmacy Med Name: SERTRALINE  HCL 100 MG TAB] 90 tablet 0    Sig: TAKE 1 TABLET BY MOUTH ONCE DAILY     Psychiatry:  Antidepressants - SSRI - sertraline  Passed - 05/10/2024  4:01 PM      Passed - AST in normal range and within 360 days    AST  Date Value Ref Range Status  04/24/2024 24 15 - 41 U/L Final         Passed - ALT in normal range and within 360 days    ALT  Date Value Ref Range Status  04/24/2024 11 0 - 44 U/L Final         Passed - Completed PHQ-2 or PHQ-9 in the last 360 days      Passed - Valid encounter within last 6 months    Recent Outpatient Visits           5 months ago Type 2 diabetes mellitus with diabetic mononeuropathy, without long-term current use of insulin  [facility]   [facility] [place], [place] [facility], [place]

## 2024-05-15 ENCOUNTER — Inpatient Hospital Stay

## 2024-05-15 ENCOUNTER — Encounter: Payer: Self-pay | Admitting: Oncology

## 2024-05-15 ENCOUNTER — Inpatient Hospital Stay (HOSPITAL_BASED_OUTPATIENT_CLINIC_OR_DEPARTMENT_OTHER): Admitting: Oncology

## 2024-05-15 VITALS — BP 129/78 | HR 85

## 2024-05-15 VITALS — BP 130/86 | HR 87 | Temp 98.5°F | Resp 19 | Ht 67.0 in | Wt 169.0 lb

## 2024-05-15 DIAGNOSIS — C7951 Secondary malignant neoplasm of bone: Secondary | ICD-10-CM | POA: Diagnosis not present

## 2024-05-15 DIAGNOSIS — Z5111 Encounter for antineoplastic chemotherapy: Secondary | ICD-10-CM | POA: Diagnosis not present

## 2024-05-15 DIAGNOSIS — D701 Agranulocytosis secondary to cancer chemotherapy: Secondary | ICD-10-CM | POA: Diagnosis not present

## 2024-05-15 DIAGNOSIS — C3412 Malignant neoplasm of upper lobe, left bronchus or lung: Secondary | ICD-10-CM

## 2024-05-15 DIAGNOSIS — Z510 Encounter for antineoplastic radiation therapy: Secondary | ICD-10-CM | POA: Diagnosis not present

## 2024-05-15 DIAGNOSIS — T451X5A Adverse effect of antineoplastic and immunosuppressive drugs, initial encounter: Secondary | ICD-10-CM

## 2024-05-15 DIAGNOSIS — Z87891 Personal history of nicotine dependence: Secondary | ICD-10-CM | POA: Diagnosis not present

## 2024-05-15 LAB — CMP (CANCER CENTER ONLY)
ALT: 12 U/L (ref 0–44)
AST: 20 U/L (ref 15–41)
Albumin: 3.7 g/dL (ref 3.5–5.0)
Alkaline Phosphatase: 84 U/L (ref 38–126)
Anion gap: 11 (ref 5–15)
BUN: 12 mg/dL (ref 6–20)
CO2: 20 mmol/L — ABNORMAL LOW (ref 22–32)
Calcium: 8.8 mg/dL — ABNORMAL LOW (ref 8.9–10.3)
Chloride: 101 mmol/L (ref 98–111)
Creatinine: 1.24 mg/dL (ref 0.61–1.24)
GFR, Estimated: >60 mL/min (ref >60)
Glucose, Bld: 177 mg/dL — ABNORMAL HIGH (ref 70–99)
Potassium: 4.3 mmol/L (ref 3.5–5.1)
Sodium: 132 mmol/L — ABNORMAL LOW (ref 135–145)
Total Bilirubin: 0.4 mg/dL (ref 0.0–1.2)
Total Protein: 7.9 g/dL (ref 6.5–8.1)

## 2024-05-15 LAB — CBC WITH DIFFERENTIAL (CANCER CENTER ONLY)
Abs Immature Granulocytes: 0.02 K/uL (ref 0.00–0.07)
Basophils Absolute: 0 K/uL (ref 0.0–0.1)
Basophils Relative: 2 %
Eosinophils Absolute: 0.2 K/uL (ref 0.0–0.5)
Eosinophils Relative: 7 %
HCT: 29.5 % — ABNORMAL LOW (ref 39.0–52.0)
Hemoglobin: 10 g/dL — ABNORMAL LOW (ref 13.0–17.0)
Immature Granulocytes: 1 %
Lymphocytes Relative: 27 %
Lymphs Abs: 0.6 K/uL — ABNORMAL LOW (ref 0.7–4.0)
MCH: 33.9 pg (ref 26.0–34.0)
MCHC: 33.9 g/dL (ref 30.0–36.0)
MCV: 100 fL (ref 80.0–100.0)
Monocytes Absolute: 0.4 K/uL (ref 0.1–1.0)
Monocytes Relative: 18 %
Neutro Abs: 1 K/uL — ABNORMAL LOW (ref 1.7–7.7)
Neutrophils Relative %: 45 %
Platelet Count: 251 K/uL (ref 150–400)
RBC: 2.95 MIL/uL — ABNORMAL LOW (ref 4.22–5.81)
RDW: 19.4 % — ABNORMAL HIGH (ref 11.5–15.5)
WBC Count: 2.2 K/uL — ABNORMAL LOW (ref 4.0–10.5)
nRBC: 0.9 % — ABNORMAL HIGH (ref 0.0–0.2)

## 2024-05-15 LAB — MAGNESIUM: Magnesium: 1.9 mg/dL (ref 1.7–2.4)

## 2024-05-15 MED ORDER — SODIUM CHLORIDE 0.9 % IV SOLN
60.0000 mg/m2 | Freq: Once | INTRAVENOUS | Status: AC
  Administered 2024-05-15: 113 mg via INTRAVENOUS
  Filled 2024-05-15: qty 113

## 2024-05-15 MED ORDER — SODIUM CHLORIDE 0.9 % IV SOLN
100.0000 mg/m2 | Freq: Once | INTRAVENOUS | Status: AC
  Administered 2024-05-15: 200 mg via INTRAVENOUS
  Filled 2024-05-15: qty 10

## 2024-05-15 MED ORDER — PALONOSETRON HCL INJECTION 0.25 MG/5ML
0.2500 mg | Freq: Once | INTRAVENOUS | Status: AC
  Administered 2024-05-15: 0.25 mg via INTRAVENOUS
  Filled 2024-05-15: qty 5

## 2024-05-15 MED ORDER — SODIUM CHLORIDE 0.9 % IV SOLN
INTRAVENOUS | Status: DC
  Filled 2024-05-15: qty 250

## 2024-05-15 MED ORDER — DEXAMETHASONE SODIUM PHOSPHATE 10 MG/ML IJ SOLN
10.0000 mg | Freq: Once | INTRAMUSCULAR | Status: AC
  Administered 2024-05-15: 10 mg via INTRAVENOUS
  Filled 2024-05-15: qty 1

## 2024-05-15 MED ORDER — MAGNESIUM SULFATE 2 GM/50ML IV SOLN
2.0000 g | Freq: Once | INTRAVENOUS | Status: AC
  Administered 2024-05-15: 2 g via INTRAVENOUS
  Filled 2024-05-15: qty 50

## 2024-05-15 MED ORDER — APREPITANT 130 MG/18ML IV EMUL
130.0000 mg | Freq: Once | INTRAVENOUS | Status: AC
  Administered 2024-05-15: 130 mg via INTRAVENOUS
  Filled 2024-05-15: qty 18

## 2024-05-15 MED ORDER — POTASSIUM CHLORIDE IN NACL 20-0.9 MEQ/L-% IV SOLN
Freq: Once | INTRAVENOUS | Status: AC
  Filled 2024-05-15: qty 1000

## 2024-05-15 NOTE — Progress Notes (Signed)
 Hematology/Oncology Consult note [facility]  [phone] [phone]  Patient Care Team: Antonette Angeline ORN, NP as PCP - General (Internal Medicine) Verdene Gills, RN as Oncology Nurse Navigator [name], MD as Consulting Physician (Oncology) Rennie Cindy SAUNDERS, MD as Consulting Physician (Oncology)   Name of the patient: Noah Velasquez  [id number]  [birth year]   Date of visit: 05/15/24  Diagnosis-  Cancer Staging  Small cell lung cancer, left upper lobe [place] [facility]) Staging form: Lung, AJCC V9 - Clinical stage from 02/21/2024: Stage IIIB (cT2, cN2b, cM0) - Signed by [name], MD on 02/21/2024 Method of lymph node assessment: Clinical    Chief complaint/ Reason for visit-on treatment assessment prior to cycle 4 of cisplatin  etoposide  chemotherapy  Heme/Onc history: Patient is a 58 year old African-American male with a past medical history significant for CHF, interstitial lung disease, type 2 diabetes.  He is mainly wheelchair-bound but lives independently.  He underwent CT chest without contrast when he had a chest x-ray which showed a lung nodule.  CT chest showed a 3.7 x 1.8 cm lobulated mass in the left upper lobe abutting the chest wall concerning for primary bronchogenic carcinoma.  Adjacent satellite nodularity in the left upper lobe.  14 mm right paratracheal node and 14 mm short axis prevascular node.  Moderate centrilobular and paraseptal emphysematous changes.  MRI brain with and without contrast showed remote infarcts and chronic microvascular disease but no evidence of acute intracranial abnormality.  Patient was seen by Dr. Isaiah and underwent bronchoscopy.  Left upper lobe lung biopsy was consistent with poorly differentiated carcinoma consistent with small cell carcinoma.  Tumor was positive for TTF-1 and neuroendocrine markers synaptophysin.  Negative for Napsin A.  Negative for squamous cell marker p40.FNA of the right  paratracheal adenopathy was done and was negative for malignancy but left-sided mediastinal and hilar lymph nodes were not sampled at the time of bronchoscopy.  He uses 2 L of oxygen  mainly at night but not during the day   PET CT scan in July 2025 showed left upper lobe lung mass with an SUV of 15.7 consistent with neoplasm.  Additional small adjacent nodules in the left upper lobe.  Several left-sided upper mediastinal hypermetabolic lymph nodes including an AP window node with an SUV of 13.8 measuring 3 x 2.3 cm.  Another lymph node to the left of the ascending aorta measuring 3.6 x 1.7 cm with an SUV of 15.9.  Small nodes superior to this level at the level of great vessels as well as additional suspicious nodes in the right paratracheal region with an SUV of 4.4.  Area of uptake along the proximal left humeral shaft with an SUV of 9.3 which could be an area of isolated bone metastases.  Low level of uptake in the right inguinal lymph node with an SUV of 3.7.   Patient had MRI of his left humerus which showed a 6 cm mass in the intramedullary region in the diaphysis of mid to proximal left humerus which was concerning for metastatic disease.  Patient is still being treated with the potential curative intent with a combination chemoradiation followed by consideration for palliative radiation to left humerus lesion    Interval history-so far he is tolerating chemotherapy well without any significant side effects.  Denies any symptoms of tingling numbness in his hands and feet.  He has completed concurrent radiation to his lungs but has not received any palliative radiation to his left humerus  so far  ECOG PS- 2 Pain scale- 0   Review of systems- Review of Systems  Constitutional:  Negative for chills, fever, malaise/fatigue and weight loss.  HENT:  Negative for congestion, ear discharge and nosebleeds.   Eyes:  Negative for blurred vision.  Respiratory:  Negative for cough, hemoptysis, sputum  production, shortness of breath and wheezing.   Cardiovascular:  Negative for chest pain, palpitations, orthopnea and claudication.  Gastrointestinal:  Negative for abdominal pain, blood in stool, constipation, diarrhea, heartburn, melena, nausea and vomiting.  Genitourinary:  Negative for dysuria, flank pain, frequency, hematuria and urgency.  Musculoskeletal:  Negative for back pain, joint pain and myalgias.  Skin:  Negative for rash.  Neurological:  Negative for dizziness, tingling, focal weakness, seizures, weakness and headaches.  Endo/Heme/Allergies:  Does not bruise/bleed easily.  Psychiatric/Behavioral:  Negative for depression and suicidal ideas. The patient does not have insomnia.       Allergies  Allergen Reactions   Hctz [Hydrochlorothiazide] Swelling   Ibuprofen Swelling     Past Medical History:  Diagnosis Date   Alcohol  use disorder    Aortic atherosclerosis    Bronchiolo-alveolar adenocarcinoma of left lung (HCC)    CHF (congestive heart failure) (HCC)    Chronic hip pain    Chronic pancreatitis (HCC)    Cigarette smoker    CKD (chronic kidney disease) stage 3, GFR 30-59 ml/min (HCC)    COPD (chronic obstructive pulmonary disease) (HCC)    Crack cocaine use    as of 01-21-24, pt states he last used a few weeks ago   Depression    DM (diabetes mellitus), type 2 (HCC)    Dyspnea    ED (erectile dysfunction)    ESBL (extended spectrum beta-lactamase) producing bacteria infection 2021   GERD (gastroesophageal reflux disease)    Hyperlipidemia    Hypertension    ILD (interstitial lung disease) (HCC)    Marijuana use    Osteoarthritis    Pneumonia    PTSD (post-traumatic stress disorder)    Seizure (HCC)    x1 years ago   Viral infection of left eye      Past Surgical History:  Procedure Laterality Date   BRONCHOSCOPY, WITH BIOPSY USING ELECTROMAGNETIC NAVIGATION Left 01/24/2024   Procedure: BRONCHOSCOPY, WITH BIOPSY USING ELECTROMAGNETIC NAVIGATION;   Surgeon: Isaiah Scrivener, MD;  Location: [facility] ORS;  Service: Pulmonary;  Laterality: Left;   ENDOBRONCHIAL ULTRASOUND Right 01/24/2024   Procedure: ENDOBRONCHIAL ULTRASOUND (EBUS);  Surgeon: Kasa, Kurian, MD;  Location: [facility] ORS;  Service: Pulmonary;  Laterality: Right;   IR IMAGING GUIDED PORT INSERTION  03/15/2024   pancreatic stent placement      Social History   Socioeconomic History   Marital status: Legally Separated    Spouse name: Not on file   Number of children: 2   Years of education: Not on file   Highest education level: High school graduate  Occupational History   Occupation: on disability  Tobacco Use   Smoking status: Former    Current packs/day: 3.00    Average packs/day: 3.0 packs/day for 15.0 years (45.0 ttl pk-yrs)    Types: Cigarettes   Smokeless tobacco: Never   Tobacco comments:    At his heaviest smoked 3ppd and now occasionally smoke 1 cigarette. 02/01/24  Vaping Use   Vaping status: Some Days  Substance and Sexual Activity   Alcohol  use: Yes    Alcohol /week: 7.0 standard drinks of alcohol     Types: 7 Cans of beer per week  Comment: occ   Drug use: Yes    Types: Marijuana, Crack cocaine    Comment: Marijuana every day. 7-58yrs clean of crack cocaine.   Sexual activity: Yes    Birth control/protection: Condom  Other Topics Concern   Not on file  Social History Narrative   Not on file   Social Drivers of Health   Financial Resource Strain: Not on file  Food Insecurity: No Food Insecurity (01/31/2024)   Hunger Vital Sign    Worried About Running Out of Food in the Last Year: Never true    Ran Out of Food in the Last Year: Never true  Recent Concern: Food Insecurity - Food Insecurity Present (01/15/2024)   Hunger Vital Sign    Worried About Running Out of Food in the Last Year: Sometimes true    Ran Out of Food in the Last Year: Sometimes true  Transportation Needs: No Transportation Needs (01/31/2024)   PRAPARE - Scientist, research (physical sciences) (Medical): No    Lack of Transportation (Non-Medical): No  Physical Activity: Not on file  Stress: Not on file  Social Connections: Not on file  Intimate Partner Violence: Not At Risk (01/31/2024)   Humiliation, Afraid, Rape, and Kick questionnaire    Fear of Current or Ex-Partner: No    Emotionally Abused: No    Physically Abused: No    Sexually Abused: No    Family History  Problem Relation Age of Onset   Diabetes Mother    Sleep apnea Mother    Emphysema Sister    CAD Neg Hx    Mental illness Neg Hx      Current Outpatient Medications:    Accu-Chek Softclix Lancets lancets, Use with blood glucose monitor in the morning, at noon, and at bedtime. May substitute to any manufacturer covered by patient's insurance., Disp: 100 each, Rfl: 0   amLODipine -olmesartan  (AZOR ) 10-40 MG tablet, Take 1 tablet by mouth daily., Disp: 90 tablet, Rfl: 1   Blood Glucose Monitoring Suppl (BLOOD GLUCOSE MONITOR SYSTEM) w/Device KIT, Check blood sugar in the morning, at noon, and at bedtime. May substitute to any manufacturer covered by patient's insurance., Disp: 1 kit, Rfl: 0   dexamethasone  (DECADRON ) 4 MG tablet, Take 2 tablets (8 mg total) by mouth daily. Take for 1 day starting the day after chemotherapy on day 4. Take with food., Disp: 30 tablet, Rfl: 1   empagliflozin  (JARDIANCE ) 10 MG TABS tablet, Take 1 tablet (10 mg total) by mouth daily before breakfast., Disp: 90 tablet, Rfl: 1   ipratropium-albuterol  (DUONEB) 0.5-2.5 (3) MG/3ML SOLN, Take 3 mLs by nebulization every 2 (two) hours as needed (wheeze, SOB). Can use instead of albuterol  inhaler, Disp: 60 mL, Rfl: 1   lidocaine -prilocaine  (EMLA ) cream, Apply to affected area once, Disp: 30 g, Rfl: 3   lovastatin  (MEVACOR ) 10 MG tablet, Take 1 tablet (10 mg total) by mouth at bedtime., Disp: 90 tablet, Rfl: 1   metFORMIN  (GLUCOPHAGE ) 500 MG tablet, Take 1 tablet (500 mg total) by mouth daily with breakfast., Disp: 90 tablet, Rfl:  1   Nebulizers (COMPRESSOR/NEBULIZER) MISC, Nebulizer and tubes/mask to use as directed with DuoNeb as needed, Disp: 1 each, Rfl: 0   omeprazole  (PRILOSEC) 20 MG capsule, Take 1 capsule (20 mg total) by mouth daily., Disp: 90 capsule, Rfl: 1   ondansetron  (ZOFRAN ) 8 MG tablet, Take 1 tablet (8 mg total) by mouth every 8 (eight) hours as needed for nausea or vomiting. Start on the  third day after cisplatin ., Disp: 30 tablet, Rfl: 1   OXYGEN , Inhale 2 L into the lungs as needed., Disp: , Rfl:    Pancrelipase , Lip-Prot-Amyl, (ZENPEP ) 15000-47000 units CPEP, Take 1 capsule (15,000 Units total) by mouth 3 (three) times daily., Disp: 270 capsule, Rfl: 1   prochlorperazine  (COMPAZINE ) 10 MG tablet, Take 1 tablet (10 mg total) by mouth every 6 (six) hours as needed for nausea or vomiting., Disp: 30 tablet, Rfl: 1   sertraline  (ZOLOFT ) 100 MG tablet, TAKE 1 TABLET BY MOUTH ONCE DAILY, Disp: 90 tablet, Rfl: 0   spironolactone  (ALDACTONE ) 25 MG tablet, Take 1 tablet (25 mg total) by mouth daily., Disp: 90 tablet, Rfl: 3   traZODone  (DESYREL ) 150 MG tablet, Take 1 tablet (150 mg total) by mouth at bedtime as needed for sleep., Disp: 90 tablet, Rfl: 1   VENTOLIN  HFA 108 (90 Base) MCG/ACT inhaler, Inhale 1-2 puffs into the lungs every 6 (six) hours as needed for wheezing or shortness of breath (or coughing)., Disp: 18 g, Rfl: 6   naproxen  (NAPROSYN ) 250 MG tablet, Take 250 mg by mouth 2 (two) times daily with a meal. (Patient not taking: Reported on 05/03/2024), Disp: , Rfl:    nicotine  (NICODERM CQ  - DOSED IN MG/24 HOURS) 21 mg/24hr patch, Place 1 patch (21 mg total) onto the skin daily. (Patient not taking: Reported on 05/03/2024), Disp: 28 patch, Rfl: 0   sucralfate  (CARAFATE ) 1 g tablet, Take 1 tablet (1 g total) by mouth 2 (two) times daily. Dissolve tablet in 4 T warm water  swish and swallow. (Patient not taking: Reported on 05/03/2024), Disp: 60 tablet, Rfl: 1  Physical exam:  Vitals:   05/15/24 0833  BP:  130/86  Pulse: 87  Resp: 19  Temp: 98.5 F (36.9 C)  TempSrc: Tympanic  SpO2: 94%  Weight: 169 lb (76.7 kg)  Height: 5' 7 (1.702 m)   Physical Exam Constitutional:      Comments: Sitting in a wheelchair.  Appears in no acute distress  Cardiovascular:     Rate and Rhythm: Normal rate and regular rhythm.     Heart sounds: Normal heart sounds.  Pulmonary:     Effort: Pulmonary effort is normal.     Breath sounds: Normal breath sounds.  Skin:    General: Skin is warm and dry.  Neurological:     Mental Status: He is alert and oriented to person, place, and time.      I have personally reviewed labs listed below:    Latest Ref Rng & Units 05/15/2024    8:10 AM  CMP  Glucose 70 - 99 mg/dL 822   BUN 6 - 20 mg/dL 12   Creatinine 9.38 - 1.24 mg/dL 8.75   Sodium 864 - 854 mmol/L 132   Potassium 3.5 - 5.1 mmol/L 4.3   Chloride 98 - 111 mmol/L 101   CO2 22 - 32 mmol/L 20   Calcium  8.9 - 10.3 mg/dL 8.8   Total Protein 6.5 - 8.1 g/dL 7.9   Total Bilirubin 0.0 - 1.2 mg/dL 0.4   Alkaline Phos 38 - 126 U/L 84   AST 15 - 41 U/L 20   ALT 0 - 44 U/L 12       Latest Ref Rng & Units 05/15/2024    8:10 AM  CBC  WBC 4.0 - 10.5 K/uL 2.2   Hemoglobin 13.0 - 17.0 g/dL 89.9   Hematocrit 60.9 - 52.0 % 29.5   Platelets 150 - 400  K/uL 251      Assessment and plan- Patient is a 58 y.o. male with history of limited stage small cell lung cancer with possible left humeral metastases.  He is here for on treatment assessment prior to cycle 4 of cisplatin  etoposide  chemotherapy  White cell count is 2.2 today with an ANC of 1.0.  He will proceed  with cycle 4 of cisplatin  etoposide  chemotherapy today.  He will be receiving etoposide  for 3 days and receive growth factor support on day 5 this week.  This will be his last chemotherapy for now.  He is planned for repeat PET scan on 05/22/2024 and I will see him back on 05/26/2024 to discuss results of PET scan and further management.  I plan to proceed  with maintenance immunotherapy until progression or toxicity given that he has concern for left humeral metastases.  Have also reached out to radiation oncology personally and Dr. Lenn will plan to offer him palliative radiation to his humerus after his PET scan results are back   Visit Diagnosis 1. Encounter for antineoplastic chemotherapy   2. Small cell lung cancer, left upper lobe (HCC)      Dr. Annah Skene, MD, MPH [facility] at [facility] [phone] 05/15/2024 9:03 AM

## 2024-05-15 NOTE — Progress Notes (Signed)
 Patient having some hip pain, but otherwise has no new or acute concerns at this time.

## 2024-05-16 ENCOUNTER — Other Ambulatory Visit: Payer: Self-pay

## 2024-05-16 ENCOUNTER — Inpatient Hospital Stay

## 2024-05-16 VITALS — BP 144/86 | HR 88 | Temp 97.1°F | Resp 19

## 2024-05-16 DIAGNOSIS — C3412 Malignant neoplasm of upper lobe, left bronchus or lung: Secondary | ICD-10-CM

## 2024-05-16 DIAGNOSIS — Z510 Encounter for antineoplastic radiation therapy: Secondary | ICD-10-CM | POA: Diagnosis not present

## 2024-05-16 MED ORDER — LIDOCAINE-PRILOCAINE 2.5-2.5 % EX CREA
TOPICAL_CREAM | CUTANEOUS | 3 refills | Status: DC
  Filled 2024-05-16: qty 30, 10d supply, fill #0

## 2024-05-16 MED ORDER — DEXAMETHASONE SODIUM PHOSPHATE 10 MG/ML IJ SOLN
10.0000 mg | Freq: Once | INTRAMUSCULAR | Status: AC
  Administered 2024-05-16: 10 mg via INTRAVENOUS
  Filled 2024-05-16: qty 1

## 2024-05-16 MED ORDER — SODIUM CHLORIDE 0.9 % IV SOLN
INTRAVENOUS | Status: DC
  Filled 2024-05-16: qty 250

## 2024-05-16 MED ORDER — SODIUM CHLORIDE 0.9 % IV SOLN
100.0000 mg/m2 | Freq: Once | INTRAVENOUS | Status: AC
  Administered 2024-05-16: 200 mg via INTRAVENOUS
  Filled 2024-05-16: qty 10

## 2024-05-17 ENCOUNTER — Ambulatory Visit

## 2024-05-17 ENCOUNTER — Inpatient Hospital Stay: Attending: Oncology

## 2024-05-17 VITALS — BP 141/87 | HR 84 | Temp 99.2°F | Resp 16

## 2024-05-17 DIAGNOSIS — C3412 Malignant neoplasm of upper lobe, left bronchus or lung: Secondary | ICD-10-CM | POA: Diagnosis not present

## 2024-05-17 DIAGNOSIS — C7951 Secondary malignant neoplasm of bone: Secondary | ICD-10-CM | POA: Diagnosis not present

## 2024-05-17 DIAGNOSIS — Z23 Encounter for immunization: Secondary | ICD-10-CM | POA: Insufficient documentation

## 2024-05-17 DIAGNOSIS — R4189 Other symptoms and signs involving cognitive functions and awareness: Secondary | ICD-10-CM | POA: Insufficient documentation

## 2024-05-17 DIAGNOSIS — Z5111 Encounter for antineoplastic chemotherapy: Secondary | ICD-10-CM | POA: Insufficient documentation

## 2024-05-17 DIAGNOSIS — N179 Acute kidney failure, unspecified: Secondary | ICD-10-CM | POA: Insufficient documentation

## 2024-05-17 DIAGNOSIS — Z87891 Personal history of nicotine dependence: Secondary | ICD-10-CM | POA: Insufficient documentation

## 2024-05-17 DIAGNOSIS — Z5189 Encounter for other specified aftercare: Secondary | ICD-10-CM | POA: Insufficient documentation

## 2024-05-17 DIAGNOSIS — Z79620 Long term (current) use of immunosuppressive biologic: Secondary | ICD-10-CM | POA: Insufficient documentation

## 2024-05-17 DIAGNOSIS — Z5112 Encounter for antineoplastic immunotherapy: Secondary | ICD-10-CM | POA: Insufficient documentation

## 2024-05-17 DIAGNOSIS — Z510 Encounter for antineoplastic radiation therapy: Secondary | ICD-10-CM | POA: Diagnosis not present

## 2024-05-17 MED ORDER — SODIUM CHLORIDE 0.9 % IV SOLN
INTRAVENOUS | Status: DC
  Filled 2024-05-17: qty 250

## 2024-05-17 MED ORDER — DEXAMETHASONE SODIUM PHOSPHATE 10 MG/ML IJ SOLN
10.0000 mg | Freq: Once | INTRAMUSCULAR | Status: AC
  Administered 2024-05-17: 10 mg via INTRAVENOUS
  Filled 2024-05-17: qty 1

## 2024-05-17 MED ORDER — SODIUM CHLORIDE 0.9 % IV SOLN
100.0000 mg/m2 | Freq: Once | INTRAVENOUS | Status: AC
  Administered 2024-05-17: 200 mg via INTRAVENOUS
  Filled 2024-05-17: qty 10

## 2024-05-17 NOTE — Patient Instructions (Signed)
 [facility] - A DEPT OF [facility]  Discharge Instructions: Thank you for choosing [facility] to provide your oncology and hematology care.  If you have a lab appointment with the Cancer Center, please go directly to the Cancer Center and check in at the registration area.  Wear comfortable clothing and clothing appropriate for easy access to any Portacath or PICC line.   We strive to give you quality time with your provider. You may need to reschedule your appointment if you arrive late (15 or more minutes).  Arriving late affects you and other patients whose appointments are after yours.  Also, if you miss three or more appointments without notifying the office, you may be dismissed from the clinic at the provider's discretion.      For prescription refill requests, have your pharmacy contact our office and allow 72 hours for refills to be completed.    Today you received the following chemotherapy and/or immunotherapy agents- etoposide      To help prevent nausea and vomiting after your treatment, we encourage you to take your nausea medication as directed.  BELOW ARE SYMPTOMS THAT SHOULD BE REPORTED IMMEDIATELY: *FEVER GREATER THAN 100.4 F (38 C) OR HIGHER *CHILLS OR SWEATING *NAUSEA AND VOMITING THAT IS NOT CONTROLLED WITH YOUR NAUSEA MEDICATION *UNUSUAL SHORTNESS OF BREATH *UNUSUAL BRUISING OR BLEEDING *URINARY PROBLEMS (pain or burning when urinating, or frequent urination) *BOWEL PROBLEMS (unusual diarrhea, constipation, pain near the anus) TENDERNESS IN MOUTH AND THROAT WITH OR WITHOUT PRESENCE OF ULCERS (sore throat, sores in mouth, or a toothache) UNUSUAL RASH, SWELLING OR PAIN  UNUSUAL VAGINAL DISCHARGE OR ITCHING   Items with * indicate a potential emergency and should be followed up as soon as possible or go to the Emergency Department if any problems should occur.  Please show the CHEMOTHERAPY ALERT CARD or IMMUNOTHERAPY  ALERT CARD at check-in to the Emergency Department and triage nurse.  Should you have questions after your visit or need to cancel or reschedule your appointment, please contact [facility] - A DEPT OF [facility]  [phone] and follow the prompts.  Office hours are 8:00 a.m. to 4:30 p.m. Monday - Friday. Please note that voicemails left after 4:00 p.m. may not be returned until the following business day.  We are closed weekends and major holidays. You have access to a nurse at all times for urgent questions. Please call the main number to the clinic [phone] and follow the prompts.  For any non-urgent questions, you may also contact your provider using MyChart. We now offer e-Visits for anyone 29 and older to request care online for non-urgent symptoms. For details visit [email or web address].   Also download the MyChart app! Go to the app store, search "MyChart", open the app, select [place], and log in with your MyChart username and password.

## 2024-05-18 ENCOUNTER — Encounter: Payer: Self-pay | Admitting: Oncology

## 2024-05-18 ENCOUNTER — Ambulatory Visit

## 2024-05-19 ENCOUNTER — Inpatient Hospital Stay

## 2024-05-19 DIAGNOSIS — Z5112 Encounter for antineoplastic immunotherapy: Secondary | ICD-10-CM | POA: Diagnosis not present

## 2024-05-19 DIAGNOSIS — N179 Acute kidney failure, unspecified: Secondary | ICD-10-CM | POA: Diagnosis not present

## 2024-05-19 DIAGNOSIS — Z23 Encounter for immunization: Secondary | ICD-10-CM

## 2024-05-19 DIAGNOSIS — R0602 Shortness of breath: Secondary | ICD-10-CM | POA: Diagnosis not present

## 2024-05-19 DIAGNOSIS — C3412 Malignant neoplasm of upper lobe, left bronchus or lung: Secondary | ICD-10-CM

## 2024-05-19 DIAGNOSIS — J449 Chronic obstructive pulmonary disease, unspecified: Secondary | ICD-10-CM | POA: Diagnosis not present

## 2024-05-19 MED ORDER — PEGFILGRASTIM-CBQV 6 MG/0.6ML ~~LOC~~ SOSY
6.0000 mg | PREFILLED_SYRINGE | Freq: Once | SUBCUTANEOUS | Status: AC
  Administered 2024-05-19: 6 mg via SUBCUTANEOUS
  Filled 2024-05-19: qty 0.6

## 2024-05-19 MED ORDER — INFLUENZA VIRUS VACC SPLIT PF (FLUZONE) 0.5 ML IM SUSY
0.5000 mL | PREFILLED_SYRINGE | INTRAMUSCULAR | Status: AC
  Administered 2024-05-19: 0.5 mL via INTRAMUSCULAR
  Filled 2024-05-19: qty 0.5

## 2024-05-22 ENCOUNTER — Ambulatory Visit
Admission: RE | Admit: 2024-05-22 | Discharge: 2024-05-22 | Disposition: A | Discharge status: Home / Self Care | Source: Ambulatory Visit | Attending: Oncology | Admitting: Oncology

## 2024-05-22 DIAGNOSIS — E119 Type 2 diabetes mellitus without complications: Secondary | ICD-10-CM | POA: Insufficient documentation

## 2024-05-22 DIAGNOSIS — J439 Emphysema, unspecified: Secondary | ICD-10-CM | POA: Insufficient documentation

## 2024-05-22 DIAGNOSIS — M25551 Pain in right hip: Secondary | ICD-10-CM | POA: Insufficient documentation

## 2024-05-22 DIAGNOSIS — K861 Other chronic pancreatitis: Secondary | ICD-10-CM | POA: Diagnosis not present

## 2024-05-22 DIAGNOSIS — I700 Atherosclerosis of aorta: Secondary | ICD-10-CM | POA: Insufficient documentation

## 2024-05-22 DIAGNOSIS — I2510 Atherosclerotic heart disease of native coronary artery without angina pectoris: Secondary | ICD-10-CM | POA: Diagnosis not present

## 2024-05-22 DIAGNOSIS — C3412 Malignant neoplasm of upper lobe, left bronchus or lung: Secondary | ICD-10-CM | POA: Diagnosis present

## 2024-05-22 LAB — GLUCOSE, CAPILLARY: Glucose-Capillary: 112 mg/dL — ABNORMAL HIGH (ref 70–99)

## 2024-05-22 MED ORDER — FLUDEOXYGLUCOSE F - 18 (FDG) INJECTION
8.8000 | Freq: Once | INTRAVENOUS | Status: AC | PRN
  Administered 2024-05-22: 9.34 via INTRAVENOUS

## 2024-05-24 ENCOUNTER — Ambulatory Visit: Payer: Self-pay | Admitting: Oncology

## 2024-05-24 ENCOUNTER — Ambulatory Visit

## 2024-05-25 ENCOUNTER — Ambulatory Visit
Admission: RE | Admit: 2024-05-25 | Discharge: 2024-05-25 | Disposition: A | Discharge status: Home / Self Care | Source: Ambulatory Visit | Attending: Radiation Oncology | Admitting: Radiation Oncology

## 2024-05-25 ENCOUNTER — Ambulatory Visit

## 2024-05-25 DIAGNOSIS — C3412 Malignant neoplasm of upper lobe, left bronchus or lung: Secondary | ICD-10-CM | POA: Diagnosis not present

## 2024-05-25 DIAGNOSIS — Z510 Encounter for antineoplastic radiation therapy: Secondary | ICD-10-CM | POA: Insufficient documentation

## 2024-05-25 DIAGNOSIS — Z5111 Encounter for antineoplastic chemotherapy: Secondary | ICD-10-CM | POA: Insufficient documentation

## 2024-05-25 DIAGNOSIS — Z87891 Personal history of nicotine dependence: Secondary | ICD-10-CM | POA: Insufficient documentation

## 2024-05-25 DIAGNOSIS — C7951 Secondary malignant neoplasm of bone: Secondary | ICD-10-CM | POA: Insufficient documentation

## 2024-05-25 DIAGNOSIS — Z5112 Encounter for antineoplastic immunotherapy: Secondary | ICD-10-CM | POA: Diagnosis not present

## 2024-05-26 ENCOUNTER — Encounter: Payer: Self-pay | Admitting: Oncology

## 2024-05-26 ENCOUNTER — Ambulatory Visit

## 2024-05-26 ENCOUNTER — Inpatient Hospital Stay (HOSPITAL_BASED_OUTPATIENT_CLINIC_OR_DEPARTMENT_OTHER): Admitting: Oncology

## 2024-05-26 VITALS — BP 115/75 | HR 84 | Temp 97.0°F | Resp 19 | Ht 67.0 in

## 2024-05-26 DIAGNOSIS — C3412 Malignant neoplasm of upper lobe, left bronchus or lung: Secondary | ICD-10-CM | POA: Diagnosis not present

## 2024-05-26 DIAGNOSIS — Z7189 Other specified counseling: Secondary | ICD-10-CM | POA: Diagnosis not present

## 2024-05-26 DIAGNOSIS — Z5112 Encounter for antineoplastic immunotherapy: Secondary | ICD-10-CM | POA: Diagnosis not present

## 2024-05-26 NOTE — Progress Notes (Signed)
 Hematology/Oncology Consult note [facility]  Telephone:([phone] [phone]  Patient Care Team: Antonette Angeline ORN, NP as PCP - General (Internal Medicine) Verdene Gills, RN as Oncology Nurse Navigator [name], MD as Consulting Physician (Oncology) Rennie Cindy SAUNDERS, MD as Consulting Physician (Oncology)   Name of the patient: Noah Velasquez  [id number]  May 14, 1966   Date of visit: 05/26/24  Diagnosis-  Cancer Staging  Small cell lung cancer, left upper lobe [facility]) Staging form: Lung, AJCC V9 - Clinical stage from 02/21/2024: Stage IIIB (cT2, cN2b, cM0) - Signed by [name], MD on 02/21/2024 Method of lymph node assessment: Clinical    Chief complaint/ Reason for visit-discuss CT scan results and further management  Heme/Onc history:  Patient is a 58 year old African-American male with a past medical history significant for CHF, interstitial lung disease, type 2 diabetes.  He is mainly wheelchair-bound but lives independently.  He underwent CT chest without contrast when he had a chest x-ray which showed a lung nodule.  CT chest showed a 3.7 x 1.8 cm lobulated mass in the left upper lobe abutting the chest wall concerning for primary bronchogenic carcinoma.  Adjacent satellite nodularity in the left upper lobe.  14 mm right paratracheal node and 14 mm short axis prevascular node.  Moderate centrilobular and paraseptal emphysematous changes.  MRI brain with and without contrast showed remote infarcts and chronic microvascular disease but no evidence of acute intracranial abnormality.  Patient was seen by Dr. Isaiah and underwent bronchoscopy.  Left upper lobe lung biopsy was consistent with poorly differentiated carcinoma consistent with small cell carcinoma.  Tumor was positive for TTF-1 and neuroendocrine markers synaptophysin.  Negative for Napsin A.  Negative for squamous cell marker p40.FNA of the right paratracheal adenopathy was done and  was negative for malignancy but left-sided mediastinal and hilar lymph nodes were not sampled at the time of bronchoscopy.  He uses 2 L of oxygen  mainly at night but not during the day   PET CT scan in July 2025 showed left upper lobe lung mass with an SUV of 15.7 consistent with neoplasm.  Additional small adjacent nodules in the left upper lobe.  Several left-sided upper mediastinal hypermetabolic lymph nodes including an AP window node with an SUV of 13.8 measuring 3 x 2.3 cm.  Another lymph node to the left of the ascending aorta measuring 3.6 x 1.7 cm with an SUV of 15.9.  Small nodes superior to this level at the level of great vessels as well as additional suspicious nodes in the right paratracheal region with an SUV of 4.4.  Area of uptake along the proximal left humeral shaft with an SUV of 9.3 which could be an area of isolated bone metastases.  Low level of uptake in the right inguinal lymph node with an SUV of 3.7.   Patient had MRI of his left humerus which showed a 6 cm mass in the intramedullary region in the diaphysis of mid to proximal left humerus which was concerning for metastatic disease.  Patient is still being treated with the potential curative intent.  He completed concurrent chemoradiation with cisplatin  etoposide  and will be receiving palliative radiation to his left humerus    Interval history-  He is currently undergoing radiation therapy, with a schedule of ten treatments expected to conclude by the end of October.  He has concerns about potential cognitive effects from chemotherapy, describing issues with memory and recollection, which he refers to as '  brain fog'. He inquires about the possibility of chemotherapy causing dementia.  ECOG PS- 2 Pain scale- 0   Review of systems- Review of Systems  Constitutional:  Negative for chills, fever, malaise/fatigue and weight loss.  HENT:  Negative for congestion, ear discharge and nosebleeds.   Eyes:  Negative for blurred  vision.  Respiratory:  Negative for cough, hemoptysis, sputum production, shortness of breath and wheezing.   Cardiovascular:  Negative for chest pain, palpitations, orthopnea and claudication.  Gastrointestinal:  Negative for abdominal pain, blood in stool, constipation, diarrhea, heartburn, melena, nausea and vomiting.  Genitourinary:  Negative for dysuria, flank pain, frequency, hematuria and urgency.  Musculoskeletal:  Negative for back pain, joint pain and myalgias.  Skin:  Negative for rash.  Neurological:  Negative for dizziness, tingling, focal weakness, seizures, weakness and headaches.  Endo/Heme/Allergies:  Does not bruise/bleed easily.  Psychiatric/Behavioral:  Negative for depression and suicidal ideas. The patient does not have insomnia.       Allergies  Allergen Reactions   Hctz [Hydrochlorothiazide] Swelling   Ibuprofen Swelling     Past Medical History:  Diagnosis Date   Alcohol  use disorder    Aortic atherosclerosis    Bronchiolo-alveolar adenocarcinoma of left lung (HCC)    CHF (congestive heart failure) (HCC)    Chronic hip pain    Chronic pancreatitis (HCC)    Cigarette smoker    CKD (chronic kidney disease) stage 3, GFR 30-59 ml/min (HCC)    COPD (chronic obstructive pulmonary disease) (HCC)    Crack cocaine use    as of 01-21-24, pt states he last used a few weeks ago   Depression    DM (diabetes mellitus), type 2 (HCC)    Dyspnea    ED (erectile dysfunction)    ESBL (extended spectrum beta-lactamase) producing bacteria infection 2021   GERD (gastroesophageal reflux disease)    Hyperlipidemia    Hypertension    ILD (interstitial lung disease) (HCC)    Marijuana use    Osteoarthritis    Pneumonia    PTSD (post-traumatic stress disorder)    Seizure (HCC)    x1 years ago   Viral infection of left eye      Past Surgical History:  Procedure Laterality Date   BRONCHOSCOPY, WITH BIOPSY USING ELECTROMAGNETIC NAVIGATION Left 01/24/2024   Procedure:  BRONCHOSCOPY, WITH BIOPSY USING ELECTROMAGNETIC NAVIGATION;  Surgeon: Isaiah Scrivener, MD;  Location: [facility] ORS;  Service: Pulmonary;  Laterality: Left;   ENDOBRONCHIAL ULTRASOUND Right 01/24/2024   Procedure: ENDOBRONCHIAL ULTRASOUND (EBUS);  Surgeon: Kasa, Kurian, MD;  Location: [facility] ORS;  Service: Pulmonary;  Laterality: Right;   IR IMAGING GUIDED PORT INSERTION  03/15/2024   pancreatic stent placement      Social History   Socioeconomic History   Marital status: Legally Separated    Spouse name: Not on file   Number of children: 2   Years of education: Not on file   Highest education level: High school graduate  Occupational History   Occupation: on disability  Tobacco Use   Smoking status: Former    Current packs/day: 3.00    Average packs/day: 3.0 packs/day for 15.0 years (45.0 ttl pk-yrs)    Types: Cigarettes   Smokeless tobacco: Never   Tobacco comments:    At his heaviest smoked 3ppd and now occasionally smoke 1 cigarette. 02/01/24  Vaping Use   Vaping status: Some Days  Substance and Sexual Activity   Alcohol  use: Yes    Alcohol /week: 7.0 standard drinks of alcohol   Types: 7 Cans of beer per week    Comment: occ   Drug use: Yes    Types: Marijuana, Crack cocaine    Comment: Marijuana every day. 7-82yrs clean of crack cocaine.   Sexual activity: Yes    Birth control/protection: Condom  Other Topics Concern   Not on file  Social History Narrative   Not on file   Social Drivers of Health   Financial Resource Strain: Not on file  Food Insecurity: No Food Insecurity (01/31/2024)   Hunger Vital Sign    Worried About Running Out of Food in the Last Year: Never true    Ran Out of Food in the Last Year: Never true  Recent Concern: Food Insecurity - Food Insecurity Present (01/15/2024)   Hunger Vital Sign    Worried About Running Out of Food in the Last Year: Sometimes true    Ran Out of Food in the Last Year: Sometimes true  Transportation Needs: No Transportation Needs  (01/31/2024)   PRAPARE - Administrator, Civil Service (Medical): No    Lack of Transportation (Non-Medical): No  Physical Activity: Not on file  Stress: Not on file  Social Connections: Not on file  Intimate Partner Violence: Not At Risk (01/31/2024)   Humiliation, Afraid, Rape, and Kick questionnaire    Fear of Current or Ex-Partner: No    Emotionally Abused: No    Physically Abused: No    Sexually Abused: No    Family History  Problem Relation Age of Onset   Diabetes Mother    Sleep apnea Mother    Emphysema Sister    CAD Neg Hx    Mental illness Neg Hx      Current Outpatient Medications:    Accu-Chek Softclix Lancets lancets, Use with blood glucose monitor in the morning, at noon, and at bedtime. May substitute to any manufacturer covered by patient's insurance., Disp: 100 each, Rfl: 0   amLODipine -olmesartan  (AZOR ) 10-40 MG tablet, Take 1 tablet by mouth daily., Disp: 90 tablet, Rfl: 1   Blood Glucose Monitoring Suppl (BLOOD GLUCOSE MONITOR SYSTEM) w/Device KIT, Check blood sugar in the morning, at noon, and at bedtime. May substitute to any manufacturer covered by patient's insurance., Disp: 1 kit, Rfl: 0   empagliflozin  (JARDIANCE ) 10 MG TABS tablet, Take 1 tablet (10 mg total) by mouth daily before breakfast., Disp: 90 tablet, Rfl: 1   ipratropium-albuterol  (DUONEB) 0.5-2.5 (3) MG/3ML SOLN, Take 3 mLs by nebulization every 2 (two) hours as needed (wheeze, SOB). Can use instead of albuterol  inhaler, Disp: 60 mL, Rfl: 1   lovastatin  (MEVACOR ) 10 MG tablet, Take 1 tablet (10 mg total) by mouth at bedtime., Disp: 90 tablet, Rfl: 1   metFORMIN  (GLUCOPHAGE ) 500 MG tablet, Take 1 tablet (500 mg total) by mouth daily with breakfast., Disp: 90 tablet, Rfl: 1   Nebulizers (COMPRESSOR/NEBULIZER) MISC, Nebulizer and tubes/mask to use as directed with DuoNeb as needed, Disp: 1 each, Rfl: 0   omeprazole  (PRILOSEC) 20 MG capsule, Take 1 capsule (20 mg total) by mouth daily.,  Disp: 90 capsule, Rfl: 1   OXYGEN , Inhale 2 L into the lungs as needed., Disp: , Rfl:    Pancrelipase , Lip-Prot-Amyl, (ZENPEP ) 15000-47000 units CPEP, Take 1 capsule (15,000 Units total) by mouth 3 (three) times daily., Disp: 270 capsule, Rfl: 1   sertraline  (ZOLOFT ) 100 MG tablet, TAKE 1 TABLET BY MOUTH ONCE DAILY, Disp: 90 tablet, Rfl: 0   spironolactone  (ALDACTONE ) 25 MG tablet, Take 1 tablet (  25 mg total) by mouth daily., Disp: 90 tablet, Rfl: 3   traZODone  (DESYREL ) 150 MG tablet, Take 1 tablet (150 mg total) by mouth at bedtime as needed for sleep., Disp: 90 tablet, Rfl: 1   VENTOLIN  HFA 108 (90 Base) MCG/ACT inhaler, Inhale 1-2 puffs into the lungs every 6 (six) hours as needed for wheezing or shortness of breath (or coughing)., Disp: 18 g, Rfl: 6   naproxen  (NAPROSYN ) 250 MG tablet, Take 250 mg by mouth 2 (two) times daily with a meal. (Patient not taking: Reported on 05/03/2024), Disp: , Rfl:    nicotine  (NICODERM CQ  - DOSED IN MG/24 HOURS) 21 mg/24hr patch, Place 1 patch (21 mg total) onto the skin daily. (Patient not taking: Reported on 05/03/2024), Disp: 28 patch, Rfl: 0   sucralfate  (CARAFATE ) 1 g tablet, Take 1 tablet (1 g total) by mouth 2 (two) times daily. Dissolve tablet in 4 T warm water  swish and swallow. (Patient not taking: Reported on 05/03/2024), Disp: 60 tablet, Rfl: 1  Physical exam:  Vitals:   05/26/24 0923  BP: 115/75  Pulse: 84  Resp: 19  Temp: (!) 97 F (36.1 C)  TempSrc: Tympanic  SpO2: 96%  Height: 5' 7 (1.702 m)   Physical Exam Constitutional:      Comments: Sitting in a wheelchair.  Appears in no acute distress  Cardiovascular:     Rate and Rhythm: Normal rate and regular rhythm.     Heart sounds: Normal heart sounds.  Pulmonary:     Effort: Pulmonary effort is normal.     Breath sounds: Normal breath sounds.  Skin:    General: Skin is warm and dry.  Neurological:     Mental Status: He is alert and oriented to person, place, and time.      I have  personally reviewed labs listed below:    Latest Ref Rng & Units 05/15/2024    8:10 AM  CMP  Glucose 70 - 99 mg/dL 822   BUN 6 - 20 mg/dL 12   Creatinine 9.38 - 1.24 mg/dL 8.75   Sodium 864 - 854 mmol/L 132   Potassium 3.5 - 5.1 mmol/L 4.3   Chloride 98 - 111 mmol/L 101   CO2 22 - 32 mmol/L 20   Calcium  8.9 - 10.3 mg/dL 8.8   Total Protein 6.5 - 8.1 g/dL 7.9   Total Bilirubin 0.0 - 1.2 mg/dL 0.4   Alkaline Phos 38 - 126 U/L 84   AST 15 - 41 U/L 20   ALT 0 - 44 U/L 12       Latest Ref Rng & Units 05/15/2024    8:10 AM  CBC  WBC 4.0 - 10.5 K/uL 2.2   Hemoglobin 13.0 - 17.0 g/dL 89.9   Hematocrit 60.9 - 52.0 % 29.5   Platelets 150 - 400 K/uL 251    I have personally reviewed Radiology images listed below:   NM PET Image Restag (PS) Skull Base To Thigh Addendum Date: 05/24/2024 ADDENDUM REPORT: 05/24/2024 13:08 ADDENDUM: Borderline residual hypermetabolism in the proximal humerus, SUV max 2.6, previously 9.3. Electronically Signed   By: Newell Eke M.D.   On: 05/24/2024 13:08   Result Date: 05/24/2024 CLINICAL DATA:  Subsequent treatment strategy for lung cancer. EXAM: NUCLEAR MEDICINE PET SKULL BASE TO THIGH TECHNIQUE: 9.3 mCi F-18 FDG was injected intravenously. Full-ring PET imaging was performed from the skull base to thigh after the radiotracer. CT data was obtained and used for attenuation correction and anatomic  localization. Fasting blood glucose: 112 mg/dl COMPARISON:  92/97/7974. FINDINGS: Mediastinal blood pool activity: SUV max 2.5 Liver activity: SUV max NA NECK: No abnormal hypermetabolism. Incidental CT findings: None. CHEST: Minimal residual borderline hypermetabolic subpleural nodular soft tissue in the anterolateral left upper lobe, measuring 8 x 11 mm (6/42), SUV max 2.4. Borderline hypermetabolic low right paratracheal lymph node measures 10 mm, SUV max 2.4, previously 4.4. No additional abnormal hypermetabolism. Incidental CT findings: Right IJ Port-A-Cath  terminates in the right atrium. Atherosclerotic calcification of the aorta and aortic valve with age advanced involvement of all 3 coronary arteries. Heart is enlarged. No pericardial or pleural effusion. Centrilobular and paraseptal emphysema. ABDOMEN/PELVIS: No abnormal hypermetabolism. Incidental CT findings: Slight nodular thickening of the left adrenal gland, unchanged. No associated hypermetabolism. Scarring in the kidneys. Chronic calcific pancreatitis. SKELETON: Mild diffuse osseous hypermetabolism, likely treatment related. No focal abnormal hypermetabolism. Incidental CT findings: Advanced right hip osteoarthritis. Degenerative changes in the spine. IMPRESSION: 1. Marked response to therapy with minimal residual borderline hypermetabolic low right paratracheal lymph node and subpleural nodular soft tissue in the anterolateral left upper lobe. 2.  Age advanced three-vessel coronary artery calcification. 3. Chronic calcific pancreatitis. 4.  Aortic atherosclerosis (ICD10-I70.0). 5.  Emphysema (ICD10-J43.9). Electronically Signed: By: Newell Eke M.D. On: 05/24/2024 08:47     Assessment and plan- Patient is a 58 y.o. male who stage III limited stage small cell lung cancer with possible left to middle metastases s/p concurrent chemoradiation with 4 cycles of cisplatin  and etoposide  here to discuss further management      Small cell lung cancer with left lung mass and mediastinal lymph node involvement, status post chemoradiation, planned immunotherapy, and consideration of prophylactic cranial irradiation -I have reviewed PET CT images independently and discussed findings with the patient Completed four cycles of chemotherapy and radiation with significant reduction in lung mass and lymph nodes. Treated aggressively, akin to stage 3, despite potential stage 4 classification. Immunotherapy with durvalumab planned. Discussed risks of autoimmune side effects. - Prophylactic cranial irradiation  could be considered due to high brain metastasis risk, but cognitive decline is a concern.  Patient needs to discuss this further with radiation oncology - Start durvalumab immunotherapy on June 12, 2024.In a randomized trial including 730 patients with stage I to III LS-SCLC (stage I/II inoperable) who had not experienced progression after concurrent chemoradiation, those assigned to two years of durvalumab had a better median overall survival than the placebo group (56 versus 33 months; HR 0.73, 95% CI 0.54-0.98) and better three-year overall survival (57 versus 47 percent) [70]. Median PFS was also improved (16.6 versus 9.2 months; HR 0.76, 95% CI 0.59-0.98).  - Continue CT scans every 3-4 months to monitor response.   Suspected metastatic small cell lung cancer to left humerus, undergoing radiation therapy Suspected metastasis to left humerus with significant improvement post-chemotherapy and radiation, suggesting cancerous nature. Radiation therapy aims to reduce fracture risk and manage suspected metastasis. 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 Difficult to definitively attribute to treatment. - Encourage mental activity to help recover cognitive function.         Visit Diagnosis 1. Small cell lung cancer, left upper lobe (HCC)   2. Goals of care, counseling/discussion      Dr. Annah Skene, MD, MPH [facility] [phone] 05/26/2024 3:04 PM

## 2024-05-26 NOTE — Progress Notes (Signed)
 DISCONTINUE ON PATHWAY REGIMEN - Small Cell Lung     A cycle is every 21 days:     Etoposide       Cisplatin    **Always confirm dose/schedule in your pharmacy ordering system**  REASON: Continuation Of Treatment PRIOR TREATMENT: LOS15: Cisplatin  75 mg/m2 D1 + Etoposide  100 mg/m2 IV D1-3 q21 Days x 4 Cycles with Concurrent Radiation TREATMENT RESPONSE: Partial Response (PR)  START ON PATHWAY REGIMEN - Small Cell Lung     A cycle is every 28 days:     Durvalumab   **Always confirm dose/schedule in your pharmacy ordering system**  Patient Characteristics: Newly Diagnosed, Preoperative or Nonsurgical Candidate (Clinical Staging), First Line, Limited Stage, Nonsurgical Candidate Therapeutic Status: Newly Diagnosed, Preoperative or Nonsurgical Candidate (Clinical Staging) AJCC T Category: cT2a AJCC N Category: cN2b AJCC M Category: cM0 AJCC 9 Stage Grouping: IIIB Check here if patient was staged using an edition other than AJCC Staging 9th Edition: true Stage Classification: Limited Surgical Candidacy: Nonsurgical Candidate Intent of Therapy: Curative Intent, Discussed with Patient

## 2024-05-28 ENCOUNTER — Encounter: Payer: Self-pay | Admitting: Oncology

## 2024-05-29 ENCOUNTER — Ambulatory Visit

## 2024-05-29 DIAGNOSIS — Z5189 Encounter for other specified aftercare: Secondary | ICD-10-CM | POA: Diagnosis not present

## 2024-05-29 DIAGNOSIS — C7951 Secondary malignant neoplasm of bone: Secondary | ICD-10-CM | POA: Diagnosis not present

## 2024-05-29 DIAGNOSIS — N179 Acute kidney failure, unspecified: Secondary | ICD-10-CM | POA: Diagnosis not present

## 2024-05-29 DIAGNOSIS — Z87891 Personal history of nicotine dependence: Secondary | ICD-10-CM | POA: Diagnosis not present

## 2024-05-29 DIAGNOSIS — Z79620 Long term (current) use of immunosuppressive biologic: Secondary | ICD-10-CM | POA: Diagnosis not present

## 2024-05-29 DIAGNOSIS — C3412 Malignant neoplasm of upper lobe, left bronchus or lung: Secondary | ICD-10-CM | POA: Diagnosis not present

## 2024-05-29 DIAGNOSIS — Z5112 Encounter for antineoplastic immunotherapy: Secondary | ICD-10-CM | POA: Diagnosis not present

## 2024-05-29 DIAGNOSIS — Z5111 Encounter for antineoplastic chemotherapy: Secondary | ICD-10-CM | POA: Diagnosis not present

## 2024-05-29 DIAGNOSIS — Z510 Encounter for antineoplastic radiation therapy: Secondary | ICD-10-CM | POA: Diagnosis not present

## 2024-05-29 DIAGNOSIS — Z23 Encounter for immunization: Secondary | ICD-10-CM | POA: Diagnosis not present

## 2024-05-29 DIAGNOSIS — R4189 Other symptoms and signs involving cognitive functions and awareness: Secondary | ICD-10-CM | POA: Diagnosis not present

## 2024-05-29 NOTE — Progress Notes (Signed)
 Pharmacist Chemotherapy Monitoring - Initial Assessment    Anticipated start date: 06/12/24   The following has been reviewed per standard work regarding the patient's treatment regimen: The patient's diagnosis, treatment plan and drug doses, and organ/hematologic function Lab orders and baseline tests specific to treatment regimen  The treatment plan start date, drug sequencing, and pre-medications Prior authorization status  Patient's documented medication list, including drug-drug interaction screen and prescriptions for anti-emetics and supportive care specific to the treatment regimen The drug concentrations, fluid compatibility, administration routes, and timing of the medications to be used The patient's access for treatment and lifetime cumulative dose history, if applicable  The patient's medication allergies and previous infusion related reactions, if applicable  Completed four cycles of chemotherapy and radiation with significant reduction in lung mass and lymph nodes.  Start durvalumab immunotherapy   Changes made to treatment plan:  N/A  Follow up needed:  Pending authorization for treatment    Redell JINNY Gaskins, [facility], 05/29/2024  11:42 AM

## 2024-05-30 ENCOUNTER — Ambulatory Visit

## 2024-05-30 ENCOUNTER — Ambulatory Visit
Admission: RE | Admit: 2024-05-30 | Discharge: 2024-05-30 | Disposition: A | Discharge status: Home / Self Care | Source: Ambulatory Visit | Attending: Radiation Oncology | Admitting: Radiation Oncology

## 2024-05-30 DIAGNOSIS — Z5112 Encounter for antineoplastic immunotherapy: Secondary | ICD-10-CM | POA: Diagnosis not present

## 2024-05-31 ENCOUNTER — Other Ambulatory Visit: Payer: Self-pay

## 2024-05-31 ENCOUNTER — Ambulatory Visit

## 2024-05-31 ENCOUNTER — Ambulatory Visit
Admission: RE | Admit: 2024-05-31 | Discharge: 2024-05-31 | Disposition: A | Discharge status: Home / Self Care | Source: Ambulatory Visit | Attending: Radiation Oncology | Admitting: Radiation Oncology

## 2024-05-31 DIAGNOSIS — C7951 Secondary malignant neoplasm of bone: Secondary | ICD-10-CM | POA: Diagnosis not present

## 2024-05-31 DIAGNOSIS — Z87891 Personal history of nicotine dependence: Secondary | ICD-10-CM | POA: Diagnosis not present

## 2024-05-31 DIAGNOSIS — C3412 Malignant neoplasm of upper lobe, left bronchus or lung: Secondary | ICD-10-CM | POA: Diagnosis not present

## 2024-05-31 DIAGNOSIS — N179 Acute kidney failure, unspecified: Secondary | ICD-10-CM | POA: Diagnosis not present

## 2024-05-31 DIAGNOSIS — Z5189 Encounter for other specified aftercare: Secondary | ICD-10-CM | POA: Diagnosis not present

## 2024-05-31 DIAGNOSIS — Z5112 Encounter for antineoplastic immunotherapy: Secondary | ICD-10-CM | POA: Diagnosis not present

## 2024-05-31 DIAGNOSIS — Z23 Encounter for immunization: Secondary | ICD-10-CM | POA: Diagnosis not present

## 2024-05-31 DIAGNOSIS — R4189 Other symptoms and signs involving cognitive functions and awareness: Secondary | ICD-10-CM | POA: Diagnosis not present

## 2024-05-31 DIAGNOSIS — Z79620 Long term (current) use of immunosuppressive biologic: Secondary | ICD-10-CM | POA: Diagnosis not present

## 2024-05-31 DIAGNOSIS — Z510 Encounter for antineoplastic radiation therapy: Secondary | ICD-10-CM | POA: Diagnosis not present

## 2024-05-31 LAB — RAD ONC ARIA SESSION SUMMARY
Course Elapsed Days: 0
Plan Fractions Treated to Date: 1
Plan Prescribed Dose Per Fraction: 3 Gy
Plan Total Fractions Prescribed: 10
Plan Total Prescribed Dose: 30 Gy
Reference Point Dosage Given to Date: 3 Gy
Reference Point Session Dosage Given: 3 Gy
Session Number: 1

## 2024-06-01 ENCOUNTER — Ambulatory Visit

## 2024-06-01 ENCOUNTER — Telehealth: Payer: Self-pay | Admitting: Pharmacy Technician

## 2024-06-01 ENCOUNTER — Other Ambulatory Visit: Payer: Self-pay

## 2024-06-01 ENCOUNTER — Ambulatory Visit
Admission: RE | Admit: 2024-06-01 | Discharge: 2024-06-01 | Disposition: A | Discharge status: Home / Self Care | Source: Ambulatory Visit | Attending: Radiation Oncology | Admitting: Radiation Oncology

## 2024-06-01 DIAGNOSIS — Z5112 Encounter for antineoplastic immunotherapy: Secondary | ICD-10-CM | POA: Diagnosis not present

## 2024-06-01 DIAGNOSIS — N179 Acute kidney failure, unspecified: Secondary | ICD-10-CM | POA: Diagnosis not present

## 2024-06-01 LAB — RAD ONC ARIA SESSION SUMMARY
Course Elapsed Days: 1
Plan Fractions Treated to Date: 2
Plan Prescribed Dose Per Fraction: 3 Gy
Plan Total Fractions Prescribed: 10
Plan Total Prescribed Dose: 30 Gy
Reference Point Dosage Given to Date: 6 Gy
Reference Point Session Dosage Given: 3 Gy
Session Number: 2

## 2024-06-01 NOTE — Telephone Encounter (Signed)
 Spoke with Rennon regarding doing a commitment for [employer].  Confirmation# [id number].  [name] Patient Pharmacologist [facility]

## 2024-06-02 ENCOUNTER — Ambulatory Visit

## 2024-06-05 ENCOUNTER — Other Ambulatory Visit: Payer: Self-pay

## 2024-06-05 ENCOUNTER — Telehealth: Admitting: Hospice and Palliative Medicine

## 2024-06-05 ENCOUNTER — Ambulatory Visit

## 2024-06-05 ENCOUNTER — Ambulatory Visit
Admission: RE | Admit: 2024-06-05 | Discharge: 2024-06-05 | Disposition: A | Discharge status: Home / Self Care | Source: Ambulatory Visit | Attending: Radiation Oncology | Admitting: Radiation Oncology

## 2024-06-05 ENCOUNTER — Telehealth: Payer: Self-pay

## 2024-06-05 DIAGNOSIS — C3492 Malignant neoplasm of unspecified part of left bronchus or lung: Secondary | ICD-10-CM

## 2024-06-05 DIAGNOSIS — C3490 Malignant neoplasm of unspecified part of unspecified bronchus or lung: Secondary | ICD-10-CM

## 2024-06-05 DIAGNOSIS — Z5112 Encounter for antineoplastic immunotherapy: Secondary | ICD-10-CM | POA: Diagnosis not present

## 2024-06-05 LAB — RAD ONC ARIA SESSION SUMMARY
Course Elapsed Days: 5
Plan Fractions Treated to Date: 3
Plan Prescribed Dose Per Fraction: 3 Gy
Plan Total Fractions Prescribed: 10
Plan Total Prescribed Dose: 30 Gy
Reference Point Dosage Given to Date: 9 Gy
Reference Point Session Dosage Given: 3 Gy
Session Number: 3

## 2024-06-05 NOTE — Progress Notes (Signed)
 Complex Care Management Note  Care Guide Note 06/05/2024 Name: Noah Velasquez MRN: [id number] DOB: [birth year]  Noah Velasquez is a 58 y.o. year old male who sees Baity, Angeline ORN, NP for primary care. I reached out to Adriana Sprang by phone today to offer complex care management services.  Mr. Ruhlman was given information about Complex Care Management services today including:   The Complex Care Management services include support from the care team which includes your Nurse Care Manager, Clinical Social Worker, or Pharmacist.  The Complex Care Management team is here to help remove barriers to the health concerns and goals most important to you. Complex Care Management services are voluntary, and the patient may decline or stop services at any time by request to their care team member.   Complex Care Management Consent Status: Patient agreed to services and verbal consent obtained.   Follow up plan:  Telephone appointment with complex care management team member scheduled for:  06/13/24 at 4:00 p.m.   Encounter Outcome:  Patient Scheduled  Dreama [facility]  [facility], [facility] VBCI Assistant Direct Dial: [phone]  Fax: [phone]

## 2024-06-05 NOTE — Progress Notes (Signed)
 Complex Care Management Note Care Guide Note  06/05/2024 Name: Noah Velasquez MRN: [id number] DOB: 09/22/1965   Complex Care Management Outreach Attempts: An unsuccessful telephone outreach was attempted today to offer the patient information about available complex care management services.  Follow Up Plan:  Additional outreach attempts will be made to offer the patient complex care management information and services.   Encounter Outcome:  No Answer  Dreama [facility]wood [facility]  [facility], [facility] VBCI Assistant Direct Dial: [phone]  Fax: [phone]

## 2024-06-06 ENCOUNTER — Ambulatory Visit

## 2024-06-06 ENCOUNTER — Encounter: Payer: Self-pay | Admitting: Oncology

## 2024-06-06 ENCOUNTER — Encounter: Payer: Self-pay | Admitting: Internal Medicine

## 2024-06-06 ENCOUNTER — Ambulatory Visit: Admitting: Internal Medicine

## 2024-06-06 VITALS — BP 112/68 | Ht 67.0 in | Wt 169.0 lb

## 2024-06-06 DIAGNOSIS — Z7984 Long term (current) use of oral hypoglycemic drugs: Secondary | ICD-10-CM

## 2024-06-06 DIAGNOSIS — E1169 Type 2 diabetes mellitus with other specified complication: Secondary | ICD-10-CM

## 2024-06-06 DIAGNOSIS — I152 Hypertension secondary to endocrine disorders: Secondary | ICD-10-CM

## 2024-06-06 DIAGNOSIS — K860 Alcohol-induced chronic pancreatitis: Secondary | ICD-10-CM | POA: Diagnosis not present

## 2024-06-06 DIAGNOSIS — T451X5A Adverse effect of antineoplastic and immunosuppressive drugs, initial encounter: Secondary | ICD-10-CM

## 2024-06-06 DIAGNOSIS — F4310 Post-traumatic stress disorder, unspecified: Secondary | ICD-10-CM

## 2024-06-06 DIAGNOSIS — E785 Hyperlipidemia, unspecified: Secondary | ICD-10-CM | POA: Diagnosis not present

## 2024-06-06 DIAGNOSIS — I700 Atherosclerosis of aorta: Secondary | ICD-10-CM | POA: Diagnosis not present

## 2024-06-06 DIAGNOSIS — E1141 Type 2 diabetes mellitus with diabetic mononeuropathy: Secondary | ICD-10-CM

## 2024-06-06 DIAGNOSIS — E663 Overweight: Secondary | ICD-10-CM

## 2024-06-06 DIAGNOSIS — I5032 Chronic diastolic (congestive) heart failure: Secondary | ICD-10-CM | POA: Diagnosis not present

## 2024-06-06 DIAGNOSIS — F5105 Insomnia due to other mental disorder: Secondary | ICD-10-CM

## 2024-06-06 DIAGNOSIS — C3412 Malignant neoplasm of upper lobe, left bronchus or lung: Secondary | ICD-10-CM | POA: Diagnosis not present

## 2024-06-06 DIAGNOSIS — J849 Interstitial pulmonary disease, unspecified: Secondary | ICD-10-CM | POA: Diagnosis not present

## 2024-06-06 DIAGNOSIS — F1021 Alcohol dependence, in remission: Secondary | ICD-10-CM

## 2024-06-06 DIAGNOSIS — K219 Gastro-esophageal reflux disease without esophagitis: Secondary | ICD-10-CM

## 2024-06-06 DIAGNOSIS — E1159 Type 2 diabetes mellitus with other circulatory complications: Secondary | ICD-10-CM

## 2024-06-06 DIAGNOSIS — M150 Primary generalized (osteo)arthritis: Secondary | ICD-10-CM

## 2024-06-06 DIAGNOSIS — D508 Other iron deficiency anemias: Secondary | ICD-10-CM

## 2024-06-06 DIAGNOSIS — N522 Drug-induced erectile dysfunction: Secondary | ICD-10-CM

## 2024-06-06 DIAGNOSIS — F419 Anxiety disorder, unspecified: Secondary | ICD-10-CM

## 2024-06-06 MED ORDER — ASPIRIN 81 MG PO TBEC: 81.0000 mg | DELAYED_RELEASE_TABLET | Freq: Every day | ORAL | Status: DC

## 2024-06-06 MED ORDER — METFORMIN HCL 500 MG PO TABS: 500.0000 mg | ORAL_TABLET | Freq: Every day | ORAL | 1 refills | Status: DC

## 2024-06-06 NOTE — Assessment & Plan Note (Signed)
 Continue tylenol  or goody's powders OTC

## 2024-06-06 NOTE — Assessment & Plan Note (Signed)
 C-Met and lipid profile today Encouraged him to consume low-fat diet Continue lovastatin  10 mg and aspirin  81 mg daily

## 2024-06-06 NOTE — Patient Instructions (Signed)

## 2024-06-06 NOTE — Assessment & Plan Note (Signed)
 A1c today Urine microalbumin has been checked within the last year Encourage low-carb diet  Continue metformin  500 mg daily in a.m., jardiance  10 mg daily Encourage routine eye exam Encouraged routine foot exam Immunizations UTD

## 2024-06-06 NOTE — Assessment & Plan Note (Signed)
 Encourage diet and exercise for weight loss

## 2024-06-06 NOTE — Assessment & Plan Note (Signed)
 Lipid profile today Encouraged him to consume a low-fat diet Continue lovastatin  10 mg daily

## 2024-06-06 NOTE — Progress Notes (Signed)
 Subjective:    Patient ID: Noah Velasquez, male    DOB: 1966-01-28, 58 y.o.   MRN: [id number]  HPI  Patient presents to clinic today for follow-up of chronic conditions.  HTN: His BP today is 112/68.  He is taking amlodipine -olmesartan  and spironolactone  as prescribed.  ECG from 6 11/2023 reviewed.  CHF: He reports chronic cough, shortness of breath but denies lower extremity edema.  He is taking amlodipine -olmesartan  and spironolactone  as prescribed.  Echo from 12/2023 reviewed.  HLD with aortic atherosclerosis: His last LDL was 59, triglycerides 41, 11/2023.  He denies myalgias on lovastatin .  He is taking aspirin  as well.  He tries to consume a low-fat diet.  OA: Mainly in his hands, knees and feet.  He takes tylenol  or goody's powders OTC with some relief of symptoms.  He does not follow with orthopedics.  ED: He is not currently taking any medication for this.  He does not follow with urology.  Anxiety, depression, PTSD: Chronic, managed on sertraline .  He is not currently seeing a therapist.  He denies SI/HI.  GERD: Triggered by greasy and spicy foods.  He denies breakthrough on omeprazole  and Carafate .  There is no upper GI on file.  Insomnia: He has difficulty staying asleep.  He is taking trazodone  as needed.  There is no sleep study on file.  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  He follows with GI.  ILD: He reports chronic cough and shortness of breath.  He is using albuterol  as prescribed.  There are no PFTs on file.  He is using oxygen  at night.  He is following with pulmonology.  DM2: His last A1c was 6.5%, 11/2023.  He is taking metformin  and jardiance  as prescribed.  He does not check his sugars.  He does not check his feet routinely.  His last eye exam was > 2 years ago.  Flu 05/2024.  Pneumovax 08/2023.  Prevnar: Never.  COVID never.  Lung cancer: He is undergoing chemo and radiation. He follows  with pulmonology and oncology.  Neutropenia/anemia: Secondary to chemotherapy.  His last WBC count was 2.7, H/H10/29.5, 04/2024.  He follows with hematology.  Review of Systems     Past Medical History:  Diagnosis Date   Alcohol  use disorder    Aortic atherosclerosis    Bronchiolo-alveolar adenocarcinoma of left lung (HCC)    CHF (congestive heart failure) (HCC)    Chronic hip pain    Chronic pancreatitis (HCC)    Cigarette smoker    CKD (chronic kidney disease) stage 3, GFR 30-59 ml/min (HCC)    COPD (chronic obstructive pulmonary disease) (HCC)    Crack cocaine use    as of 01-21-24, pt states he last used a few weeks ago   Depression    DM (diabetes mellitus), type 2 (HCC)    Dyspnea    ED (erectile dysfunction)    ESBL (extended spectrum beta-lactamase) producing bacteria infection 2021   GERD (gastroesophageal reflux disease)    Hyperlipidemia    Hypertension    ILD (interstitial lung disease) (HCC)    Marijuana use    Osteoarthritis    Pneumonia    PTSD (post-traumatic stress disorder)    Seizure (HCC)    x1 years ago   Viral infection of left eye     Current Outpatient Medications  Medication Sig Dispense Refill   Accu-Chek Softclix Lancets lancets Use with blood glucose monitor in the  morning, at noon, and at bedtime. May substitute to any manufacturer covered by patient's insurance. 100 each 0   amLODipine -olmesartan  (AZOR ) 10-40 MG tablet Take 1 tablet by mouth daily. 90 tablet 1   Blood Glucose Monitoring Suppl (BLOOD GLUCOSE MONITOR SYSTEM) w/Device KIT Check blood sugar in the morning, at noon, and at bedtime. May substitute to any manufacturer covered by patient's insurance. 1 kit 0   empagliflozin  (JARDIANCE ) 10 MG TABS tablet Take 1 tablet (10 mg total) by mouth daily before breakfast. 90 tablet 1   ipratropium-albuterol  (DUONEB) 0.5-2.5 (3) MG/3ML SOLN Take 3 mLs by nebulization every 2 (two) hours as needed (wheeze, SOB). Can use instead of albuterol   inhaler 60 mL 1   lovastatin  (MEVACOR ) 10 MG tablet Take 1 tablet (10 mg total) by mouth at bedtime. 90 tablet 1   metFORMIN  (GLUCOPHAGE ) 500 MG tablet Take 1 tablet (500 mg total) by mouth daily with breakfast. 90 tablet 1   naproxen  (NAPROSYN ) 250 MG tablet Take 250 mg by mouth 2 (two) times daily with a meal. (Patient not taking: Reported on 05/03/2024)     Nebulizers (COMPRESSOR/NEBULIZER) MISC Nebulizer and tubes/mask to use as directed with DuoNeb as needed 1 each 0   nicotine  (NICODERM CQ  - DOSED IN MG/24 HOURS) 21 mg/24hr patch Place 1 patch (21 mg total) onto the skin daily. (Patient not taking: Reported on 05/03/2024) 28 patch 0   omeprazole  (PRILOSEC) 20 MG capsule Take 1 capsule (20 mg total) by mouth daily. 90 capsule 1   OXYGEN  Inhale 2 L into the lungs as needed.     Pancrelipase , Lip-Prot-Amyl, (ZENPEP ) 15000-47000 units CPEP Take 1 capsule (15,000 Units total) by mouth 3 (three) times daily. 270 capsule 1   sertraline  (ZOLOFT ) 100 MG tablet TAKE 1 TABLET BY MOUTH ONCE DAILY 90 tablet 0   spironolactone  (ALDACTONE ) 25 MG tablet Take 1 tablet (25 mg total) by mouth daily. 90 tablet 3   sucralfate  (CARAFATE ) 1 g tablet Take 1 tablet (1 g total) by mouth 2 (two) times daily. Dissolve tablet in 4 T warm water  swish and swallow. (Patient not taking: Reported on 05/03/2024) 60 tablet 1   traZODone  (DESYREL ) 150 MG tablet Take 1 tablet (150 mg total) by mouth at bedtime as needed for sleep. 90 tablet 1   VENTOLIN  HFA 108 (90 Base) MCG/ACT inhaler Inhale 1-2 puffs into the lungs every 6 (six) hours as needed for wheezing or shortness of breath (or coughing). 18 g 6   No current facility-administered medications for this visit.    Allergies  Allergen Reactions   Hctz [Hydrochlorothiazide] Swelling   Ibuprofen Swelling    Family History  Problem Relation Age of Onset   Diabetes Mother    Sleep apnea Mother    Emphysema Sister    CAD Neg Hx    Mental illness Neg Hx     Social  History   Socioeconomic History   Marital status: Legally Separated    Spouse name: Not on file   Number of children: 2   Years of education: Not on file   Highest education level: High school graduate  Occupational History   Occupation: on disability  Tobacco Use   Smoking status: Former    Current packs/day: 3.00    Average packs/day: 3.0 packs/day for 15.0 years (45.0 ttl pk-yrs)    Types: Cigarettes   Smokeless tobacco: Never   Tobacco comments:    At his heaviest smoked 3ppd and now occasionally smoke 1 cigarette.  02/01/24  Vaping Use   Vaping status: Some Days  Substance and Sexual Activity   Alcohol  use: Yes    Alcohol /week: 7.0 standard drinks of alcohol     Types: 7 Cans of beer per week    Comment: occ   Drug use: Yes    Types: Marijuana, Crack cocaine    Comment: Marijuana every day. 7-37yrs clean of crack cocaine.   Sexual activity: Yes    Birth control/protection: Condom  Other Topics Concern   Not on file  Social History Narrative   Not on file   Social Drivers of Health   Financial Resource Strain: Not on file  Food Insecurity: No Food Insecurity (01/31/2024)   Hunger Vital Sign    Worried About Running Out of Food in the Last Year: Never true    Ran Out of Food in the Last Year: Never true  Recent Concern: Food Insecurity - Food Insecurity Present (01/15/2024)   Hunger Vital Sign    Worried About Running Out of Food in the Last Year: Sometimes true    Ran Out of Food in the Last Year: Sometimes true  Transportation Needs: No Transportation Needs (01/31/2024)   PRAPARE - Administrator, Civil Service (Medical): No    Lack of Transportation (Non-Medical): No  Physical Activity: Not on file  Stress: Not on file  Social Connections: Not on file  Intimate Partner Violence: Not At Risk (01/31/2024)   Humiliation, Afraid, Rape, and Kick questionnaire    Fear of Current or Ex-Partner: No    Emotionally Abused: No    Physically Abused: No     Sexually Abused: No     Constitutional: Denies fever, malaise, fatigue, headache or abrupt weight changes.  HEENT: Pt reports dry eyes, eye pressure. Denies eye pain, eye redness, ear pain, ringing in the ears, wax buildup, runny nose, nasal congestion, bloody nose, or sore throat. Respiratory: Patient reports chronic cough and shortness of breath.  Denies difficulty breathing, or sputum production.   Cardiovascular: Denies chest pain, chest tightness, palpitations or swelling in the hands or feet.  Gastrointestinal: Pt reports intermittent diarrhea. Denies abdominal pain, bloating, constipation, or blood in the stool.  GU: Patient reports erectile dysfunction.  Denies urgency, frequency, pain with urination, burning sensation, blood in urine, odor or discharge. Musculoskeletal: Patient reports joint pain, difficulty with gait.  Denies decrease in range of motion, muscle pain or joint swelling.  Skin: Denies redness, rashes, lesions or ulcercations.  Neurological: Patient reports insomnia, neuropathy.  Denies dizziness, difficulty with memory, difficulty with speech or problems with balance and coordination.  Psych: Patient has a history of anxiety and depression.  Denies SI/HI.  No other specific complaints in a complete review of systems (except as listed in HPI above).  Objective:   Physical Exam   BP 112/68 (BP Location: Right Arm, Patient Position: Sitting, Cuff Size: Normal)   Ht 5' 7 (1.702 m)   Wt 169 lb (76.7 kg)   BMI 26.47 kg/m    Wt Readings from Last 3 Encounters:  05/15/24 169 lb (76.7 kg)  05/03/24 167 lb (75.8 kg)  04/24/24 167 lb (75.8 kg)    General: Appears his stated age, overweight, chronically ill appearing, in NAD. Skin: Warm, dry and intact.  No ulcerations noted. HEENT: Head: normal shape and size; Eyes: sclera white, no icterus, conjunctiva pink, PERRLA and EOMs intact, exophthalmus noted;  Cardiovascular: Normal rate and rhythm. S1,S2 noted.  No  murmur, rubs or gallops noted. No  JVD or BLE edema. No carotid bruits noted. Pulmonary/Chest: Normal effort and diminished breath sounds. No respiratory distress. No wheezes, rales or ronchi noted.  Abdomen: Soft and nontender. Normal bowel sounds.  Musculoskeletal: In wheelchair. Neurological: Alert and oriented. Coordination normal.  Decree sensation of bilateral feet. Psychiatric: Mood and affect flat.  Behavior is normal. Judgment and thought content normal.     BMET    Component Value Date/Time   NA 132 (L) 05/15/2024 0810   K 4.3 05/15/2024 0810   CL 101 05/15/2024 0810   CO2 20 (L) 05/15/2024 0810   GLUCOSE 177 (H) 05/15/2024 0810   BUN 12 05/15/2024 0810   CREATININE 1.24 05/15/2024 0810   CREATININE 1.03 12/07/2023 1334   CALCIUM  8.8 (L) 05/15/2024 0810   GFRNONAA >60 05/15/2024 0810   GFRNONAA 64 06/06/2020 1141   GFRAA 74 06/06/2020 1141    Lipid Panel     Component Value Date/Time   CHOL 151 12/07/2023 1334   TRIG 41 12/07/2023 1334   HDL 80 12/07/2023 1334   CHOLHDL 1.9 12/07/2023 1334   VLDL 6 08/01/2022 0342   LDLCALC 59 12/07/2023 1334    CBC    Component Value Date/Time   WBC 2.2 (L) 05/15/2024 0810   WBC 10.7 (H) 01/16/2024 0702   RBC 2.95 (L) 05/15/2024 0810   HGB 10.0 (L) 05/15/2024 0810   HCT 29.5 (L) 05/15/2024 0810   PLT 251 05/15/2024 0810   MCV 100.0 05/15/2024 0810   MCH 33.9 05/15/2024 0810   MCHC 33.9 05/15/2024 0810   RDW 19.4 (H) 05/15/2024 0810   LYMPHSABS 0.6 (L) 05/15/2024 0810   MONOABS 0.4 05/15/2024 0810   EOSABS 0.2 05/15/2024 0810   BASOSABS 0.0 05/15/2024 0810    Hgb A1C Lab Results  Component Value Date   HGBA1C 6.5 (H) 12/07/2023           Assessment & Plan:     RTC in 6 months for your annual exam Angeline Laura, NP

## 2024-06-06 NOTE — Assessment & Plan Note (Signed)
 CBC reviewed

## 2024-06-06 NOTE — Assessment & Plan Note (Signed)
 Stable on sertraline  100 mg daily Support offered

## 2024-06-06 NOTE — Assessment & Plan Note (Signed)
 CBC reviewed He will continue to follow with oncology

## 2024-06-06 NOTE — Assessment & Plan Note (Signed)
 Currently getting chemoradiation He will continue to follow with pulmonology and oncology

## 2024-06-06 NOTE — Assessment & Plan Note (Signed)
 Compensated Reinforced DASH diet Monitor daily weights Continue amlodipine -olmesartan  10-40 mg and spironolactone  25 mg daily Kidney function reviewed

## 2024-06-06 NOTE — Assessment & Plan Note (Signed)
 Not medicated We will monitor

## 2024-06-06 NOTE — Assessment & Plan Note (Signed)
 Controlled on amlodipine -olmesartan  10-40 mg and spironolactone  25 mg daily Kidney function reviewed Reinforced DASH diet

## 2024-06-06 NOTE — Assessment & Plan Note (Signed)
 Congratulated him on smoking cessation Sinew albuterol  108 mcg per actuation every 4-6 hours as needed Continue oxygen  at bedtime per pulmonology

## 2024-06-06 NOTE — Assessment & Plan Note (Signed)
 Continue trazodone  150 mg at bedtime Will monitor

## 2024-06-06 NOTE — Assessment & Plan Note (Signed)
 Encouraged alcohol  abstinence Continue pancrelipase  150,000-470,000 units 3 times daily

## 2024-06-06 NOTE — Assessment & Plan Note (Signed)
 Continue omeprazole  20 mg daily Avoid foods that trigger reflux

## 2024-06-06 NOTE — Assessment & Plan Note (Signed)
Encouraged abstinence.  

## 2024-06-06 NOTE — Assessment & Plan Note (Signed)
 Continue sertraline  100 mg daily Support offered

## 2024-06-07 ENCOUNTER — Other Ambulatory Visit: Payer: Self-pay

## 2024-06-07 ENCOUNTER — Ambulatory Visit: Payer: Self-pay | Admitting: Internal Medicine

## 2024-06-07 ENCOUNTER — Ambulatory Visit

## 2024-06-07 ENCOUNTER — Ambulatory Visit
Admission: RE | Admit: 2024-06-07 | Discharge: 2024-06-07 | Disposition: A | Source: Ambulatory Visit | Attending: Radiation Oncology | Admitting: Radiation Oncology

## 2024-06-07 DIAGNOSIS — Z5112 Encounter for antineoplastic immunotherapy: Secondary | ICD-10-CM | POA: Diagnosis not present

## 2024-06-07 LAB — RAD ONC ARIA SESSION SUMMARY
Course Elapsed Days: 7
Plan Fractions Treated to Date: 4
Plan Prescribed Dose Per Fraction: 3 Gy
Plan Total Fractions Prescribed: 10
Plan Total Prescribed Dose: 30 Gy
Reference Point Dosage Given to Date: 12 Gy
Reference Point Session Dosage Given: 3 Gy
Session Number: 4

## 2024-06-07 LAB — LIPID PANEL
Cholesterol: 115 mg/dL (ref <200)
HDL: 64 mg/dL (ref >=40)
LDL Cholesterol (Calc): 31 mg/dL
Non-HDL Cholesterol (Calc): 51 mg/dL (ref <130)
Total CHOL/HDL Ratio: 1.8 (calc) (ref <5.0)
Triglycerides: 112 mg/dL (ref <150)

## 2024-06-07 LAB — HEMOGLOBIN A1C
Hgb A1c MFr Bld: 7.1 % — ABNORMAL HIGH (ref <5.7)
Mean Plasma Glucose: 157 mg/dL
eAG (mmol/L): 8.7 mmol/L

## 2024-06-08 ENCOUNTER — Ambulatory Visit
Admission: RE | Admit: 2024-06-08 | Discharge: 2024-06-08 | Disposition: A | Discharge status: Home / Self Care | Source: Ambulatory Visit | Attending: Radiation Oncology | Admitting: Radiation Oncology

## 2024-06-08 ENCOUNTER — Other Ambulatory Visit: Payer: Self-pay

## 2024-06-08 DIAGNOSIS — Z87891 Personal history of nicotine dependence: Secondary | ICD-10-CM | POA: Diagnosis not present

## 2024-06-08 DIAGNOSIS — N179 Acute kidney failure, unspecified: Secondary | ICD-10-CM | POA: Diagnosis not present

## 2024-06-08 DIAGNOSIS — C3412 Malignant neoplasm of upper lobe, left bronchus or lung: Secondary | ICD-10-CM | POA: Diagnosis not present

## 2024-06-08 DIAGNOSIS — Z5112 Encounter for antineoplastic immunotherapy: Secondary | ICD-10-CM | POA: Diagnosis not present

## 2024-06-08 DIAGNOSIS — Z5189 Encounter for other specified aftercare: Secondary | ICD-10-CM | POA: Diagnosis not present

## 2024-06-08 DIAGNOSIS — Z79620 Long term (current) use of immunosuppressive biologic: Secondary | ICD-10-CM | POA: Diagnosis not present

## 2024-06-08 DIAGNOSIS — Z510 Encounter for antineoplastic radiation therapy: Secondary | ICD-10-CM | POA: Diagnosis not present

## 2024-06-08 DIAGNOSIS — Z23 Encounter for immunization: Secondary | ICD-10-CM | POA: Diagnosis not present

## 2024-06-08 DIAGNOSIS — R4189 Other symptoms and signs involving cognitive functions and awareness: Secondary | ICD-10-CM | POA: Diagnosis not present

## 2024-06-08 DIAGNOSIS — Z5111 Encounter for antineoplastic chemotherapy: Secondary | ICD-10-CM | POA: Diagnosis not present

## 2024-06-08 DIAGNOSIS — C7951 Secondary malignant neoplasm of bone: Secondary | ICD-10-CM | POA: Diagnosis not present

## 2024-06-08 LAB — RAD ONC ARIA SESSION SUMMARY
Course Elapsed Days: 8
Plan Fractions Treated to Date: 5
Plan Prescribed Dose Per Fraction: 3 Gy
Plan Total Fractions Prescribed: 10
Plan Total Prescribed Dose: 30 Gy
Reference Point Dosage Given to Date: 15 Gy
Reference Point Session Dosage Given: 3 Gy
Session Number: 5

## 2024-06-09 ENCOUNTER — Other Ambulatory Visit: Payer: Self-pay

## 2024-06-09 ENCOUNTER — Ambulatory Visit
Admission: RE | Admit: 2024-06-09 | Discharge: 2024-06-09 | Disposition: A | Discharge status: Home / Self Care | Source: Ambulatory Visit | Attending: Radiation Oncology | Admitting: Radiation Oncology

## 2024-06-09 DIAGNOSIS — Z5112 Encounter for antineoplastic immunotherapy: Secondary | ICD-10-CM | POA: Diagnosis not present

## 2024-06-09 LAB — RAD ONC ARIA SESSION SUMMARY
Course Elapsed Days: 9
Plan Fractions Treated to Date: 6
Plan Prescribed Dose Per Fraction: 3 Gy
Plan Total Fractions Prescribed: 10
Plan Total Prescribed Dose: 30 Gy
Reference Point Dosage Given to Date: 18 Gy
Reference Point Session Dosage Given: 3 Gy
Session Number: 6

## 2024-06-12 ENCOUNTER — Encounter: Payer: Self-pay | Admitting: Oncology

## 2024-06-12 ENCOUNTER — Inpatient Hospital Stay

## 2024-06-12 ENCOUNTER — Inpatient Hospital Stay: Admitting: Oncology

## 2024-06-12 ENCOUNTER — Other Ambulatory Visit: Payer: Self-pay

## 2024-06-12 ENCOUNTER — Ambulatory Visit: Admitting: Radiation Oncology

## 2024-06-12 ENCOUNTER — Other Ambulatory Visit

## 2024-06-12 ENCOUNTER — Ambulatory Visit
Admission: RE | Admit: 2024-06-12 | Discharge: 2024-06-12 | Disposition: A | Source: Ambulatory Visit | Attending: Radiation Oncology | Admitting: Radiation Oncology

## 2024-06-12 VITALS — BP 129/87 | HR 89 | Temp 98.1°F | Resp 18 | Ht 67.0 in | Wt 169.0 lb

## 2024-06-12 VITALS — BP 130/80 | HR 86

## 2024-06-12 DIAGNOSIS — C3412 Malignant neoplasm of upper lobe, left bronchus or lung: Secondary | ICD-10-CM

## 2024-06-12 DIAGNOSIS — N179 Acute kidney failure, unspecified: Secondary | ICD-10-CM

## 2024-06-12 DIAGNOSIS — R4189 Other symptoms and signs involving cognitive functions and awareness: Secondary | ICD-10-CM | POA: Diagnosis not present

## 2024-06-12 DIAGNOSIS — Z510 Encounter for antineoplastic radiation therapy: Secondary | ICD-10-CM | POA: Diagnosis not present

## 2024-06-12 DIAGNOSIS — E871 Hypo-osmolality and hyponatremia: Secondary | ICD-10-CM

## 2024-06-12 DIAGNOSIS — C7951 Secondary malignant neoplasm of bone: Secondary | ICD-10-CM | POA: Diagnosis not present

## 2024-06-12 DIAGNOSIS — Z23 Encounter for immunization: Secondary | ICD-10-CM | POA: Diagnosis not present

## 2024-06-12 DIAGNOSIS — Z5112 Encounter for antineoplastic immunotherapy: Secondary | ICD-10-CM

## 2024-06-12 DIAGNOSIS — Z79620 Long term (current) use of immunosuppressive biologic: Secondary | ICD-10-CM | POA: Diagnosis not present

## 2024-06-12 DIAGNOSIS — Z5189 Encounter for other specified aftercare: Secondary | ICD-10-CM | POA: Diagnosis not present

## 2024-06-12 DIAGNOSIS — Z5111 Encounter for antineoplastic chemotherapy: Secondary | ICD-10-CM | POA: Diagnosis not present

## 2024-06-12 LAB — RAD ONC ARIA SESSION SUMMARY
Course Elapsed Days: 12
Plan Fractions Treated to Date: 7
Plan Prescribed Dose Per Fraction: 3 Gy
Plan Total Fractions Prescribed: 10
Plan Total Prescribed Dose: 30 Gy
Reference Point Dosage Given to Date: 21 Gy
Reference Point Session Dosage Given: 3 Gy
Session Number: 7

## 2024-06-12 LAB — CMP (CANCER CENTER ONLY)
ALT: 11 U/L (ref 0–44)
AST: 23 U/L (ref 15–41)
Albumin: 3.6 g/dL (ref 3.5–5.0)
Alkaline Phosphatase: 76 U/L (ref 38–126)
Anion gap: 5 (ref 5–15)
BUN: 13 mg/dL (ref 6–20)
CO2: 22 mmol/L (ref 22–32)
Calcium: 8.7 mg/dL — ABNORMAL LOW (ref 8.9–10.3)
Chloride: 104 mmol/L (ref 98–111)
Creatinine: 1.38 mg/dL — ABNORMAL HIGH (ref 0.61–1.24)
GFR, Estimated: 59 mL/min — ABNORMAL LOW (ref >60)
Glucose, Bld: 181 mg/dL — ABNORMAL HIGH (ref 70–99)
Potassium: 3.9 mmol/L (ref 3.5–5.1)
Sodium: 131 mmol/L — ABNORMAL LOW (ref 135–145)
Total Bilirubin: 0.4 mg/dL (ref 0.0–1.2)
Total Protein: 8.2 g/dL — ABNORMAL HIGH (ref 6.5–8.1)

## 2024-06-12 LAB — CBC WITH DIFFERENTIAL (CANCER CENTER ONLY)
Abs Immature Granulocytes: 0.08 K/uL — ABNORMAL HIGH (ref 0.00–0.07)
Basophils Absolute: 0.1 K/uL (ref 0.0–0.1)
Basophils Relative: 1 %
Eosinophils Absolute: 0.1 K/uL (ref 0.0–0.5)
Eosinophils Relative: 1 %
HCT: 28.6 % — ABNORMAL LOW (ref 39.0–52.0)
Hemoglobin: 9.3 g/dL — ABNORMAL LOW (ref 13.0–17.0)
Immature Granulocytes: 1 %
Lymphocytes Relative: 13 %
Lymphs Abs: 1 K/uL (ref 0.7–4.0)
MCH: 34.4 pg — ABNORMAL HIGH (ref 26.0–34.0)
MCHC: 32.5 g/dL (ref 30.0–36.0)
MCV: 105.9 fL — ABNORMAL HIGH (ref 80.0–100.0)
Monocytes Absolute: 0.9 K/uL (ref 0.1–1.0)
Monocytes Relative: 12 %
Neutro Abs: 5.8 K/uL (ref 1.7–7.7)
Neutrophils Relative %: 72 %
Platelet Count: 339 K/uL (ref 150–400)
RBC: 2.7 MIL/uL — ABNORMAL LOW (ref 4.22–5.81)
RDW: 20 % — ABNORMAL HIGH (ref 11.5–15.5)
WBC Count: 8 K/uL (ref 4.0–10.5)
nRBC: 0 % (ref 0.0–0.2)

## 2024-06-12 LAB — TSH: TSH: 0.922 u[IU]/mL (ref 0.350–4.500)

## 2024-06-12 MED ORDER — SODIUM CHLORIDE 0.9 % IV SOLN
INTRAVENOUS | Status: DC
  Filled 2024-06-12: qty 250

## 2024-06-12 MED ORDER — SODIUM CHLORIDE 0.9 % IV SOLN
Freq: Once | INTRAVENOUS | Status: AC
  Filled 2024-06-12: qty 250

## 2024-06-12 MED ORDER — SODIUM CHLORIDE 0.9 % IV SOLN
1500.0000 mg | Freq: Once | INTRAVENOUS | Status: AC
  Administered 2024-06-12: 1500 mg via INTRAVENOUS
  Filled 2024-06-12: qty 30

## 2024-06-12 MED ORDER — ACETAMINOPHEN 325 MG PO TABS
650.0000 mg | ORAL_TABLET | Freq: Once | ORAL | Status: AC
  Administered 2024-06-12: 650 mg via ORAL
  Filled 2024-06-12: qty 2

## 2024-06-12 NOTE — Progress Notes (Signed)
 Patient is feeling tired and having right hip pain.

## 2024-06-12 NOTE — Progress Notes (Signed)
 Hematology/Oncology Consult note [facility]  [phone] [phone]  Patient Care Team: Antonette Angeline ORN, NP as PCP - General (Internal Medicine) Verdene Gills, RN as Oncology Nurse Navigator [name], MD as Consulting Physician (Oncology) Rennie Cindy SAUNDERS, MD as Consulting Physician (Oncology)   Name of the patient: Noah Velasquez  [id number]  10/14/1965   Date of visit: 06/12/24  Diagnosis-  Cancer Staging  Small cell lung cancer, left upper lobe [facility]) Staging form: Lung, AJCC V9 - Clinical stage from 02/21/2024: Stage IIIB (cT2, cN2b, cM0) - Signed by [name], MD on 02/21/2024 Method of lymph node assessment: Clinical    Chief complaint/ Reason for visit-on treatment assessment prior to cycle 2 of maintenance durvalumab  Heme/Onc history: Patient is a 58 year old African-American male with a past medical history significant for CHF, interstitial lung disease, type 2 diabetes.  He is mainly wheelchair-bound but lives independently.  He underwent CT chest without contrast when he had a chest x-ray which showed a lung nodule.  CT chest showed a 3.7 x 1.8 cm lobulated mass in the left upper lobe abutting the chest wall concerning for primary bronchogenic carcinoma.  Adjacent satellite nodularity in the left upper lobe.  14 mm right paratracheal node and 14 mm short axis prevascular node.  Moderate centrilobular and paraseptal emphysematous changes.  MRI brain with and without contrast showed remote infarcts and chronic microvascular disease but no evidence of acute intracranial abnormality.  Patient was seen by Dr. Isaiah and underwent bronchoscopy.  Left upper lobe lung biopsy was consistent with poorly differentiated carcinoma consistent with small cell carcinoma.  Tumor was positive for TTF-1 and neuroendocrine markers synaptophysin.  Negative for Napsin A.  Negative for squamous cell marker p40.FNA of the right paratracheal  adenopathy was done and was negative for malignancy but left-sided mediastinal and hilar lymph nodes were not sampled at the time of bronchoscopy.  He uses 2 L of oxygen  mainly at night but not during the day   PET CT scan in July 2025 showed left upper lobe lung mass with an SUV of 15.7 consistent with neoplasm.  Additional small adjacent nodules in the left upper lobe.  Several left-sided upper mediastinal hypermetabolic lymph nodes including an AP window node with an SUV of 13.8 measuring 3 x 2.3 cm.  Another lymph node to the left of the ascending aorta measuring 3.6 x 1.7 cm with an SUV of 15.9.  Small nodes superior to this level at the level of great vessels as well as additional suspicious nodes in the right paratracheal region with an SUV of 4.4.  Area of uptake along the proximal left humeral shaft with an SUV of 9.3 which could be an area of isolated bone metastases.  Low level of uptake in the right inguinal lymph node with an SUV of 3.7.   Patient had MRI of his left humerus which showed a 6 cm mass in the intramedullary region in the diaphysis of mid to proximal left humerus which was concerning for metastatic disease.  Patient is still being treated with the potential curative intent.  He completed concurrent chemoradiation with cisplatin  etoposide  and will be receiving palliative radiation to his left humerus.  He is presently on maintenance durvalumab    Interval history- Discussed the use of AI scribe software for clinical note transcription with the patient, who gave verbal consent to proceed.  History of Present Illness   Noah Velasquez is a 58 year  old male with stage four small cell lung cancer who presents for follow-up and treatment.  He is undergoing treatment for stage four small cell lung cancer, which has metastasized to the humerus. He has completed chemoradiation and is currently receiving radiation therapy to the humerus, with two more sessions remaining. He has also started  immunotherapy with durvalumab, having received one treatment so far, and is scheduled for another today. No significant side effects from the durvalumab, except for some darkening of the skin on his face, which he attributes to prior chemotherapy.  He experiences difficulty sleeping and feels that his cognitive function has slowed since chemotherapy. He is concerned about the potential cognitive effects of prophylactic brain radiation, as he has no current brain metastases.  He experiences pain in his hip and requests Tylenol  for relief.  No nausea, vomiting, diarrhea, or skin rash following his last durvalumab infusion.       ECOG PS- 2 Pain scale- 4   Review of systems- Review of Systems  Constitutional:  Positive for malaise/fatigue. Negative for chills, fever and weight loss.  HENT:  Negative for congestion, ear discharge and nosebleeds.   Eyes:  Negative for blurred vision.  Respiratory:  Negative for cough, hemoptysis, sputum production, shortness of breath and wheezing.   Cardiovascular:  Negative for chest pain, palpitations, orthopnea and claudication.  Gastrointestinal:  Negative for abdominal pain, blood in stool, constipation, diarrhea, heartburn, melena, nausea and vomiting.  Genitourinary:  Negative for dysuria, flank pain, frequency, hematuria and urgency.  Musculoskeletal:  Positive for back pain. Negative for joint pain and myalgias.  Skin:  Negative for rash.  Neurological:  Negative for dizziness, tingling, focal weakness, seizures, weakness and headaches.  Endo/Heme/Allergies:  Does not bruise/bleed easily.  Psychiatric/Behavioral:  Negative for depression and suicidal ideas. The patient does not have insomnia.       Allergies  Allergen Reactions   Hctz [Hydrochlorothiazide] Swelling   Ibuprofen Swelling     Past Medical History:  Diagnosis Date   Alcohol  use disorder    Aortic atherosclerosis    Bronchiolo-alveolar adenocarcinoma of left lung (HCC)     CHF (congestive heart failure) (HCC)    Chronic hip pain    Chronic pancreatitis (HCC)    Cigarette smoker    CKD (chronic kidney disease) stage 3, GFR 30-59 ml/min (HCC)    COPD (chronic obstructive pulmonary disease) (HCC)    Crack cocaine use    as of 01-21-24, pt states he last used a few weeks ago   Depression    DM (diabetes mellitus), type 2 (HCC)    Dyspnea    ED (erectile dysfunction)    ESBL (extended spectrum beta-lactamase) producing bacteria infection 2021   GERD (gastroesophageal reflux disease)    Hyperlipidemia    Hypertension    ILD (interstitial lung disease) (HCC)    Marijuana use    Osteoarthritis    Pneumonia    PTSD (post-traumatic stress disorder)    Seizure (HCC)    x1 years ago   Viral infection of left eye      Past Surgical History:  Procedure Laterality Date   BRONCHOSCOPY, WITH BIOPSY USING ELECTROMAGNETIC NAVIGATION Left 01/24/2024   Procedure: BRONCHOSCOPY, WITH BIOPSY USING ELECTROMAGNETIC NAVIGATION;  Surgeon: Isaiah Scrivener, MD;  Location: [facility] ORS;  Service: Pulmonary;  Laterality: Left;   ENDOBRONCHIAL ULTRASOUND Right 01/24/2024   Procedure: ENDOBRONCHIAL ULTRASOUND (EBUS);  Surgeon: Kasa, Kurian, MD;  Location: [facility] ORS;  Service: Pulmonary;  Laterality: Right;   IR IMAGING  GUIDED PORT INSERTION  03/15/2024   pancreatic stent placement      Social History   Socioeconomic History   Marital status: Legally Separated    Spouse name: Not on file   Number of children: 2   Years of education: Not on file   Highest education level: High school graduate  Occupational History   Occupation: on disability  Tobacco Use   Smoking status: Former    Current packs/day: 3.00    Average packs/day: 3.0 packs/day for 15.0 years (45.0 ttl pk-yrs)    Types: Cigarettes   Smokeless tobacco: Never   Tobacco comments:    At his heaviest smoked 3ppd and now occasionally smoke 1 cigarette. 02/01/24  Vaping Use   Vaping status: Some Days  Substance and Sexual  Activity   Alcohol  use: Yes    Alcohol /week: 7.0 standard drinks of alcohol     Types: 7 Cans of beer per week    Comment: occ   Drug use: Yes    Types: Marijuana, Crack cocaine    Comment: Marijuana every day. 7-76yrs clean of crack cocaine.   Sexual activity: Yes    Birth control/protection: Condom  Other Topics Concern   Not on file  Social History Narrative   Not on file   Social Drivers of Health   Financial Resource Strain: Not on file  Food Insecurity: No Food Insecurity (01/31/2024)   Hunger Vital Sign    Worried About Running Out of Food in the Last Year: Never true    Ran Out of Food in the Last Year: Never true  Recent Concern: Food Insecurity - Food Insecurity Present (01/15/2024)   Hunger Vital Sign    Worried About Running Out of Food in the Last Year: Sometimes true    Ran Out of Food in the Last Year: Sometimes true  Transportation Needs: No Transportation Needs (01/31/2024)   PRAPARE - Administrator, Civil Service (Medical): No    Lack of Transportation (Non-Medical): No  Physical Activity: Not on file  Stress: Not on file  Social Connections: Not on file  Intimate Partner Violence: Not At Risk (01/31/2024)   Humiliation, Afraid, Rape, and Kick questionnaire    Fear of Current or Ex-Partner: No    Emotionally Abused: No    Physically Abused: No    Sexually Abused: No    Family History  Problem Relation Age of Onset   Diabetes Mother    Sleep apnea Mother    Emphysema Sister    CAD Neg Hx    Mental illness Neg Hx      Current Outpatient Medications:    Accu-Chek Softclix Lancets lancets, Use with blood glucose monitor in the morning, at noon, and at bedtime. May substitute to any manufacturer covered by patient's insurance., Disp: 100 each, Rfl: 0   amLODipine -olmesartan  (AZOR ) 10-40 MG tablet, Take 1 tablet by mouth daily., Disp: 90 tablet, Rfl: 1   aspirin  EC 81 MG tablet, Take 1 tablet (81 mg total) by mouth daily. Swallow whole., Disp:  , Rfl:    Blood Glucose Monitoring Suppl (BLOOD GLUCOSE MONITOR SYSTEM) w/Device KIT, Check blood sugar in the morning, at noon, and at bedtime. May substitute to any manufacturer covered by patient's insurance., Disp: 1 kit, Rfl: 0   empagliflozin  (JARDIANCE ) 10 MG TABS tablet, Take 1 tablet (10 mg total) by mouth daily before breakfast., Disp: 90 tablet, Rfl: 1   ipratropium-albuterol  (DUONEB) 0.5-2.5 (3) MG/3ML SOLN, Take 3 mLs by nebulization every  2 (two) hours as needed (wheeze, SOB). Can use instead of albuterol  inhaler, Disp: 60 mL, Rfl: 1   lovastatin  (MEVACOR ) 10 MG tablet, Take 1 tablet (10 mg total) by mouth at bedtime., Disp: 90 tablet, Rfl: 1   metFORMIN  (GLUCOPHAGE ) 500 MG tablet, Take 1 tablet (500 mg total) by mouth daily with breakfast., Disp: 90 tablet, Rfl: 1   Nebulizers (COMPRESSOR/NEBULIZER) MISC, Nebulizer and tubes/mask to use as directed with DuoNeb as needed, Disp: 1 each, Rfl: 0   omeprazole  (PRILOSEC) 20 MG capsule, Take 1 capsule (20 mg total) by mouth daily., Disp: 90 capsule, Rfl: 1   OXYGEN , Inhale 2 L into the lungs as needed., Disp: , Rfl:    Pancrelipase , Lip-Prot-Amyl, (ZENPEP ) 15000-47000 units CPEP, Take 1 capsule (15,000 Units total) by mouth 3 (three) times daily., Disp: 270 capsule, Rfl: 1   sertraline  (ZOLOFT ) 100 MG tablet, TAKE 1 TABLET BY MOUTH ONCE DAILY, Disp: 90 tablet, Rfl: 0   spironolactone  (ALDACTONE ) 25 MG tablet, Take 1 tablet (25 mg total) by mouth daily., Disp: 90 tablet, Rfl: 3   VENTOLIN  HFA 108 (90 Base) MCG/ACT inhaler, Inhale 1-2 puffs into the lungs every 6 (six) hours as needed for wheezing or shortness of breath (or coughing)., Disp: 18 g, Rfl: 6   traZODone  (DESYREL ) 150 MG tablet, Take 1 tablet (150 mg total) by mouth at bedtime as needed for sleep. (Patient not taking: Reported on 06/12/2024), Disp: 90 tablet, Rfl: 1 No current facility-administered medications for this visit.  Facility-Administered Medications Ordered in Other  Visits:    0.9 %  sodium chloride  infusion, , Intravenous, Continuous, [name], MD   durvalumab (IMFINZI) 1,500 mg in sodium chloride  0.9 % 100 mL chemo infusion, 1,500 mg, Intravenous, Once, [name], MD, Last Rate: 130 mL/hr at 06/12/24 1211, 1,500 mg at 06/12/24 1211  Physical exam:  Vitals:   06/12/24 1028  BP: 129/87  Pulse: 89  Resp: 18  Temp: 98.1 F (36.7 C)  TempSrc: Tympanic  SpO2: 96%  Weight: 169 lb (76.7 kg)  Height: 5' 7 (1.702 m)   Physical Exam Constitutional:      Comments: Sitting in a motorized wheelchair.  Appears in no acute distress  Cardiovascular:     Rate and Rhythm: Normal rate and regular rhythm.     Heart sounds: Normal heart sounds.  Pulmonary:     Effort: Pulmonary effort is normal.     Breath sounds: Normal breath sounds.  Abdominal:     General: Bowel sounds are normal.     Palpations: Abdomen is soft.  Skin:    General: Skin is warm and dry.  Neurological:     Mental Status: He is alert and oriented to person, place, and time.      I have personally reviewed labs listed below:    Latest Ref Rng & Units 06/12/2024   10:02 AM  CMP  Glucose 70 - 99 mg/dL 818   BUN 6 - 20 mg/dL 13   Creatinine 9.38 - 1.24 mg/dL 8.61   Sodium 864 - 854 mmol/L 131   Potassium 3.5 - 5.1 mmol/L 3.9   Chloride 98 - 111 mmol/L 104   CO2 22 - 32 mmol/L 22   Calcium  8.9 - 10.3 mg/dL 8.7   Total Protein 6.5 - 8.1 g/dL 8.2   Total Bilirubin 0.0 - 1.2 mg/dL 0.4   Alkaline Phos 38 - 126 U/L 76   AST 15 - 41 U/L 23   ALT  0 - 44 U/L 11       Latest Ref Rng & Units 06/12/2024   10:02 AM  CBC  WBC 4.0 - 10.5 K/uL 8.0   Hemoglobin 13.0 - 17.0 g/dL 9.3   Hematocrit 60.9 - 52.0 % 28.6   Platelets 150 - 400 K/uL 339    I have personally reviewed Radiology images listed below: No images are attached to the encounter.  NM PET Image Restag (PS) Skull Base To Thigh Addendum Date: 05/24/2024 ADDENDUM REPORT: 05/24/2024 13:08 ADDENDUM: Borderline  residual hypermetabolism in the proximal humerus, SUV max 2.6, previously 9.3. Electronically Signed   By: Newell Eke M.D.   On: 05/24/2024 13:08   Result Date: 05/24/2024 CLINICAL DATA:  Subsequent treatment strategy for lung cancer. EXAM: NUCLEAR MEDICINE PET SKULL BASE TO THIGH TECHNIQUE: 9.3 mCi F-18 FDG was injected intravenously. Full-ring PET imaging was performed from the skull base to thigh after the radiotracer. CT data was obtained and used for attenuation correction and anatomic localization. Fasting blood glucose: 112 mg/dl COMPARISON:  92/97/7974. FINDINGS: Mediastinal blood pool activity: SUV max 2.5 Liver activity: SUV max NA NECK: No abnormal hypermetabolism. Incidental CT findings: None. CHEST: Minimal residual borderline hypermetabolic subpleural nodular soft tissue in the anterolateral left upper lobe, measuring 8 x 11 mm (6/42), SUV max 2.4. Borderline hypermetabolic low right paratracheal lymph node measures 10 mm, SUV max 2.4, previously 4.4. No additional abnormal hypermetabolism. Incidental CT findings: Right IJ Port-A-Cath terminates in the right atrium. Atherosclerotic calcification of the aorta and aortic valve with age advanced involvement of all 3 coronary arteries. Heart is enlarged. No pericardial or pleural effusion. Centrilobular and paraseptal emphysema. ABDOMEN/PELVIS: No abnormal hypermetabolism. Incidental CT findings: Slight nodular thickening of the left adrenal gland, unchanged. No associated hypermetabolism. Scarring in the kidneys. Chronic calcific pancreatitis. SKELETON: Mild diffuse osseous hypermetabolism, likely treatment related. No focal abnormal hypermetabolism. Incidental CT findings: Advanced right hip osteoarthritis. Degenerative changes in the spine. IMPRESSION: 1. Marked response to therapy with minimal residual borderline hypermetabolic low right paratracheal lymph node and subpleural nodular soft tissue in the anterolateral left upper lobe. 2.  Age  advanced three-vessel coronary artery calcification. 3. Chronic calcific pancreatitis. 4.  Aortic atherosclerosis (ICD10-I70.0). 5.  Emphysema (ICD10-J43.9). Electronically Signed: By: Newell Eke M.D. On: 05/24/2024 08:47     Assessment and plan- Patient is a 58 y.o. male with history of stage III small cell carcinoma of the lung with possible metastases to left humerus s/p 4 cycles of cisplatin  etoposide  concurrent with chemoradiation here for on treatment assessment prior to cycle 2 of maintenance durvalumab  Assessment and Plan    Stage IV small cell lung cancer with metastasis to the left humerus Positive response to treatment with significant lung mass reduction. No brain metastases detected. Discussed cognitive side effects of potential prophylactic brain radiation. Durvalumab immunotherapy ongoing with no significant adverse effects. - Continue radiation therapy to the left humerus with two remaining sessions. - Administer second durvalumab treatment today. - Schedule next durvalumab treatment for Monday of Thanksgiving week. - Discuss prophylactic brain radiation with Dr. Camelia during upcoming appointment.  Hip pain Likely related to metastatic disease or treatment side effects. - Administer Tylenol  for hip pain relief.  Acute kidney injury Slight decline in kidney function with increased creatinine levels. - Administer IV fluids today to address elevated creatinine. - Encourage increased oral fluid intake at home to maintain kidney function.         Visit Diagnosis 1. Small cell lung  cancer, left upper lobe (HCC)   2. AKI (acute kidney injury)   3. Encounter for antineoplastic immunotherapy      Dr. Annah Skene, MD, MPH [facility] at [facility] [phone] 06/12/2024 1:01 PM

## 2024-06-13 ENCOUNTER — Ambulatory Visit
Admission: RE | Admit: 2024-06-13 | Discharge: 2024-06-13 | Disposition: A | Source: Ambulatory Visit | Attending: Radiation Oncology | Admitting: Radiation Oncology

## 2024-06-13 ENCOUNTER — Other Ambulatory Visit: Payer: Self-pay

## 2024-06-13 DIAGNOSIS — Z5112 Encounter for antineoplastic immunotherapy: Secondary | ICD-10-CM | POA: Diagnosis not present

## 2024-06-13 DIAGNOSIS — Z5189 Encounter for other specified aftercare: Secondary | ICD-10-CM | POA: Diagnosis not present

## 2024-06-13 LAB — RAD ONC ARIA SESSION SUMMARY
Course Elapsed Days: 13
Plan Fractions Treated to Date: 8
Plan Prescribed Dose Per Fraction: 3 Gy
Plan Total Fractions Prescribed: 10
Plan Total Prescribed Dose: 30 Gy
Reference Point Dosage Given to Date: 24 Gy
Reference Point Session Dosage Given: 3 Gy
Session Number: 8

## 2024-06-13 LAB — T4: T4, Total: 5.7 ug/dL (ref 4.5–12.0)

## 2024-06-14 ENCOUNTER — Ambulatory Visit
Admission: RE | Admit: 2024-06-14 | Discharge: 2024-06-14 | Disposition: A | Discharge status: Home / Self Care | Source: Ambulatory Visit | Attending: Radiation Oncology | Admitting: Radiation Oncology

## 2024-06-14 ENCOUNTER — Ambulatory Visit

## 2024-06-14 ENCOUNTER — Other Ambulatory Visit: Payer: Self-pay

## 2024-06-14 DIAGNOSIS — Z5112 Encounter for antineoplastic immunotherapy: Secondary | ICD-10-CM | POA: Diagnosis not present

## 2024-06-14 DIAGNOSIS — N179 Acute kidney failure, unspecified: Secondary | ICD-10-CM | POA: Diagnosis not present

## 2024-06-14 DIAGNOSIS — Z79620 Long term (current) use of immunosuppressive biologic: Secondary | ICD-10-CM | POA: Diagnosis not present

## 2024-06-14 LAB — RAD ONC ARIA SESSION SUMMARY
Course Elapsed Days: 14
Plan Fractions Treated to Date: 9
Plan Prescribed Dose Per Fraction: 3 Gy
Plan Total Fractions Prescribed: 10
Plan Total Prescribed Dose: 30 Gy
Reference Point Dosage Given to Date: 27 Gy
Reference Point Session Dosage Given: 3 Gy
Session Number: 9

## 2024-06-14 NOTE — Patient Outreach (Signed)
   Name: Noah Velasquez MRN: [id number] DOB: November 27, 1965  Noah Velasquez was referred by the Care Guide team and scheduled for engagement related to High Risk Medicaid/Small Lung Cell Carcinoma.  Attempted to complete outreach with Noah Velasquez today. Reports returning from radiation treatment at time of call. Reports he is currently doing well and being followed closely by the Radiation/Oncology team. Declined to complete extensive questions related to medications and assessment noting that he completes these questions frequently with his oncology team.   Declines current need for additional care management outreach. Denies current needs related to Social Drivers of Health. Reports plan to discuss other options for insurance coverage after he completes treatment. Agreed to notify his Oncology team or PCP if his needs change and assistance is required. The care management team will gladly assist.   Jackson Acron [facility] RN Care Manager Direct Dial: [phone]  Fax: [phone] Website: [email or web address]

## 2024-06-15 ENCOUNTER — Ambulatory Visit

## 2024-06-15 ENCOUNTER — Ambulatory Visit
Admission: RE | Admit: 2024-06-15 | Discharge: 2024-06-15 | Disposition: A | Source: Ambulatory Visit | Attending: Radiation Oncology | Admitting: Radiation Oncology

## 2024-06-15 ENCOUNTER — Other Ambulatory Visit: Payer: Self-pay

## 2024-06-15 DIAGNOSIS — Z5112 Encounter for antineoplastic immunotherapy: Secondary | ICD-10-CM | POA: Diagnosis not present

## 2024-06-15 LAB — RAD ONC ARIA SESSION SUMMARY
Course Elapsed Days: 15
Plan Fractions Treated to Date: 10
Plan Prescribed Dose Per Fraction: 3 Gy
Plan Total Fractions Prescribed: 10
Plan Total Prescribed Dose: 30 Gy
Reference Point Dosage Given to Date: 30 Gy
Reference Point Session Dosage Given: 3 Gy
Session Number: 10

## 2024-06-19 DIAGNOSIS — R0602 Shortness of breath: Secondary | ICD-10-CM | POA: Diagnosis not present

## 2024-06-20 ENCOUNTER — Other Ambulatory Visit: Payer: Self-pay | Admitting: Internal Medicine

## 2024-06-22 ENCOUNTER — Other Ambulatory Visit: Payer: Self-pay | Admitting: Internal Medicine

## 2024-06-22 NOTE — Telephone Encounter (Unsigned)
 Copied from CRM [id number]. Topic: Clinical - Medication Refill >> Jun 22, 2024  2:02 PM Carla L wrote: Medication: amLODipine -olmesartan  (AZOR ) 10-40 MG tablet Patient states he does not have anymore tablets, he took his last of the 90 day supply yesterday    Has the patient contacted their pharmacy? Yes Told to reach out to office for further refills as denied by office.   This is the patient's preferred pharmacy:  [facility] - [place], [place] - [address] [place] [address] Phone: [phone] Fax: [phone]  Is this the correct pharmacy for this prescription? Yes  Has the prescription been filled recently? No  Is the patient out of the medication? Yes  Has the patient been seen for an appointment in the last year OR does the patient have an upcoming appointment? Yes  Can we respond through MyChart? No  Agent: Please be advised that Rx refills may take up to 3 business days. We ask that you follow-up with your pharmacy.

## 2024-06-22 NOTE — Telephone Encounter (Signed)
 Requested Prescriptions  Refused Prescriptions Disp Refills   amLODipine -olmesartan  (AZOR ) 10-40 MG tablet [Pharmacy Med Name: AMLODIPINE -OLMESARTAN  10-40 MG TAB] 90 tablet 1    Sig: TAKE 1 TABLET BY MOUTH ONCE DAILY     Cardiovascular: CCB + ARB Combos Failed - 06/22/2024  1:28 PM      Failed - Cr in normal range and within 180 days    Creatinine  Date Value Ref Range Status  06/12/2024 1.38 (H) 0.61 - 1.24 mg/dL Final   Creat  Date Value Ref Range Status  12/07/2023 1.03 0.70 - 1.30 mg/dL Final   Creatinine, Urine  Date Value Ref Range Status  12/07/2023 203 20 - 320 mg/dL Final         Failed - Na in normal range and within 180 days    Sodium  Date Value Ref Range Status  06/12/2024 131 (L) 135 - 145 mmol/L Final         Passed - K in normal range and within 180 days    Potassium  Date Value Ref Range Status  06/12/2024 3.9 3.5 - 5.1 mmol/L Final         Passed - Patient is not pregnant      Passed - Last BP in normal range    BP Readings from Last 1 Encounters:  06/12/24 130/80         Passed - Valid encounter within last 6 months    Recent Outpatient Visits           2 weeks ago Type 2 diabetes mellitus with diabetic mononeuropathy, without long-term current use of insulin  [facility])   [place] [facility] Hetting[place], Angeline ORN, NP   6 months ago Type 2 diabetes mellitus with diabetic mononeuropathy, without long-term current use of insulin  [facility])   [place] [facility] [place], Angeline ORN, [place]

## 2024-06-23 ENCOUNTER — Telehealth: Payer: Self-pay | Admitting: Pharmacy Technician

## 2024-06-23 NOTE — Telephone Encounter (Signed)
 Too soon for refill, LRF 01/25/24 FOR 90 DAYS.  Requested Prescriptions  Pending Prescriptions Disp Refills   amLODipine -olmesartan  (AZOR ) 10-40 MG tablet 90 tablet 1    Sig: Take 1 tablet by mouth daily.     Cardiovascular: CCB + ARB Combos Failed - 06/23/2024  3:45 PM      Failed - Cr in normal range and within 180 days    Creatinine  Date Value Ref Range Status  06/12/2024 1.38 (H) 0.61 - 1.24 mg/dL Final   Creat  Date Value Ref Range Status  12/07/2023 1.03 0.70 - 1.30 mg/dL Final   Creatinine, Urine  Date Value Ref Range Status  12/07/2023 203 20 - 320 mg/dL Final         Failed - Na in normal range and within 180 days    Sodium  Date Value Ref Range Status  06/12/2024 131 (L) 135 - 145 mmol/L Final         Passed - K in normal range and within 180 days    Potassium  Date Value Ref Range Status  06/12/2024 3.9 3.5 - 5.1 mmol/L Final         Passed - Patient is not pregnant      Passed - Last BP in normal range    BP Readings from Last 1 Encounters:  06/12/24 130/80         Passed - Valid encounter within last 6 months    Recent Outpatient Visits           2 weeks ago Type 2 diabetes mellitus with diabetic mononeuropathy, without long-term current use of insulin  [facility])   [place] [facility] [place], Angeline ORN, NP   6 months ago Type 2 diabetes mellitus with diabetic mononeuropathy, without long-term current use of insulin  [facility])   [place] [facility] [place], Angeline ORN, [place]

## 2024-06-23 NOTE — Telephone Encounter (Signed)
 Patient inquiring as to available balance on [employer].  Provided patient with the availability.  Patient stated that he will bring a utility bill when he comes to his appointment on 06/26/24.  [name] Patient Pharmacologist [facility]

## 2024-06-26 ENCOUNTER — Other Ambulatory Visit: Payer: Self-pay | Admitting: Internal Medicine

## 2024-06-26 ENCOUNTER — Ambulatory Visit: Attending: Radiation Oncology | Admitting: Radiation Oncology

## 2024-06-26 NOTE — Telephone Encounter (Signed)
 Copied from CRM [id number]. Topic: Clinical - Medication Refill >> Jun 26, 2024  2:23 PM Wess RAMAN wrote: Medication: amLODipine -olmesartan  (AZOR ) 10-40 MG tablet   Has the patient contacted their pharmacy? Yes (Agent: If no, request that the patient contact the pharmacy for the refill. If patient does not wish to contact the pharmacy document the reason why and proceed with request.) (Agent: If yes, when and what did the pharmacy advise?)  This is the patient's preferred pharmacy:   [facility] - [place], [place] - [address] [place] [address] Phone: [phone] Fax: [phone]  Is this the correct pharmacy for this prescription? Yes If no, delete pharmacy and type the correct one.   Has the prescription been filled recently? Yes  Is the patient out of the medication? Yes  Has the patient been seen for an appointment in the last year OR does the patient have an upcoming appointment? Yes  Can we respond through MyChart? Yes  Agent: Please be advised that Rx refills may take up to 3 business days. We ask that you follow-up with your pharmacy.

## 2024-06-28 ENCOUNTER — Inpatient Hospital Stay: Attending: Oncology | Admitting: Licensed Clinical Social Worker

## 2024-06-28 NOTE — Progress Notes (Signed)
 [facility] CSW Progress Note  Clinical Child Psychotherapist contacted patient by phone to follow-up on emotional support.    Interventions: Provided brief mental health counseling with regard to adjusting to his illness.  He complained of chronic shortness of breath.  He did not wish for CSW to contact his medical team.  Patient confirmed his upcoming appointments.  He stated he missed his last appointment due to transportation.       Follow Up Plan:  CSW will follow-up with patient by phone     Macario CHRISTELLA Au, LCSW Clinical Social Worker [facility]    Patient is participating in a Managed Medicaid Plan:  Yes

## 2024-06-28 NOTE — Telephone Encounter (Signed)
 Duplicate request, too soon for refill.  Requested Prescriptions  Pending Prescriptions Disp Refills   amLODipine -olmesartan  (AZOR ) 10-40 MG tablet 90 tablet 1    Sig: Take 1 tablet by mouth daily.     Cardiovascular: CCB + ARB Combos Failed - 06/28/2024  2:58 PM      Failed - Cr in normal range and within 180 days    Creatinine  Date Value Ref Range Status  06/12/2024 1.38 (H) 0.61 - 1.24 mg/dL Final   Creat  Date Value Ref Range Status  12/07/2023 1.03 0.70 - 1.30 mg/dL Final   Creatinine, Urine  Date Value Ref Range Status  12/07/2023 203 20 - 320 mg/dL Final         Failed - Na in normal range and within 180 days    Sodium  Date Value Ref Range Status  06/12/2024 131 (L) 135 - 145 mmol/L Final         Passed - K in normal range and within 180 days    Potassium  Date Value Ref Range Status  06/12/2024 3.9 3.5 - 5.1 mmol/L Final         Passed - Patient is not pregnant      Passed - Last BP in normal range    BP Readings from Last 1 Encounters:  06/12/24 130/80         Passed - Valid encounter within last 6 months    Recent Outpatient Visits           3 weeks ago Type 2 diabetes mellitus with diabetic mononeuropathy, without long-term current use of insulin  [facility])   [place] [facility] [place], Angeline ORN, NP   6 months ago Type 2 diabetes mellitus with diabetic mononeuropathy, without long-term current use of insulin  [facility])   [place] [facility] [place], Angeline ORN, [place]

## 2024-06-29 ENCOUNTER — Other Ambulatory Visit: Payer: Self-pay

## 2024-06-29 ENCOUNTER — Other Ambulatory Visit (HOSPITAL_COMMUNITY): Payer: Self-pay

## 2024-06-29 ENCOUNTER — Other Ambulatory Visit: Payer: Self-pay | Admitting: Internal Medicine

## 2024-06-29 MED ORDER — AMLODIPINE-OLMESARTAN 10-40 MG PO TABS
1.0000 | ORAL_TABLET | Freq: Every day | ORAL | 1 refills | Status: AC
  Filled 2024-06-29 (×2): qty 90, 90d supply, fill #0

## 2024-06-29 NOTE — Telephone Encounter (Signed)
 Copied from CRM #[id number]. Topic: Clinical - Medication Refill >> Jun 29, 2024 11:38 AM Jasmin G wrote: Medication: amLODipine -olmesartan  (AZOR ) 10-40 MG tablet, Bumex  Has the patient contacted their pharmacy? No (Agent: If no, request that the patient contact the pharmacy for the refill. If patient does not wish to contact the pharmacy document the reason why and proceed with request.) (Agent: If yes, when and what did the pharmacy advise?)  This is the patient's preferred pharmacy:  [facility] - [place], [place] - [address] [place] [address] Phone: [phone] Fax: [phone]  Is this the correct pharmacy for this prescription? Yes If no, delete pharmacy and type the correct one.   Has the prescription been filled recently? Yes  Is the patient out of the medication? Yes  Has the patient been seen for an appointment in the last year OR does the patient have an upcoming appointment? No  Can we respond through MyChart? No  Agent: Please be advised that Rx refills may take up to 3 business days. We ask that you follow-up with your pharmacy.

## 2024-07-01 ENCOUNTER — Other Ambulatory Visit: Payer: Self-pay | Admitting: Internal Medicine

## 2024-07-04 ENCOUNTER — Other Ambulatory Visit: Payer: Self-pay | Admitting: Internal Medicine

## 2024-07-04 NOTE — Telephone Encounter (Signed)
 Call to patient- he has already received RF Requested Prescriptions  Pending Prescriptions Disp Refills   amLODipine -olmesartan  (AZOR ) 10-40 MG tablet [Pharmacy Med Name: AMLODIPINE -OLMESARTAN  10-40 MG TAB] 90 tablet 1    Sig: TAKE 1 TABLET BY MOUTH ONCE DAILY     Cardiovascular: CCB + ARB Combos Failed - 07/04/2024 12:39 PM      Failed - Cr in normal range and within 180 days    Creatinine  Date Value Ref Range Status  06/12/2024 1.38 (H) 0.61 - 1.24 mg/dL Final   Creat  Date Value Ref Range Status  12/07/2023 1.03 0.70 - 1.30 mg/dL Final   Creatinine, Urine  Date Value Ref Range Status  12/07/2023 203 20 - 320 mg/dL Final         Failed - Na in normal range and within 180 days    Sodium  Date Value Ref Range Status  06/12/2024 131 (L) 135 - 145 mmol/L Final         Passed - K in normal range and within 180 days    Potassium  Date Value Ref Range Status  06/12/2024 3.9 3.5 - 5.1 mmol/L Final         Passed - Patient is not pregnant      Passed - Last BP in normal range    BP Readings from Last 1 Encounters:  06/12/24 130/80         Passed - Valid encounter within last 6 months    Recent Outpatient Visits           4 weeks ago Type 2 diabetes mellitus with diabetic mononeuropathy, without long-term current use of insulin  [facility])   [facility] [place], Angeline ORN, NP   7 months ago Type 2 diabetes mellitus with diabetic mononeuropathy, without long-term current use of insulin  [facility])   [place] [facility] [place], Angeline ORN, [place]

## 2024-07-07 ENCOUNTER — Other Ambulatory Visit: Payer: Self-pay

## 2024-07-07 NOTE — Telephone Encounter (Signed)
 Requested by interface surescripts. Medication last delivered to patient 07/03/24 at 4:31 pm. Ordered on 06/29/24 #90 1 refills.  Requested Prescriptions  Refused Prescriptions Disp Refills   amLODipine -olmesartan  (AZOR ) 10-40 MG tablet [Pharmacy Med Name: AMLODIPINE -OLMESARTAN  10-40 MG TAB] 90 tablet 1    Sig: TAKE 1 TABLET BY MOUTH ONCE DAILY     Cardiovascular: CCB + ARB Combos Failed - 07/07/2024 10:04 AM      Failed - Cr in normal range and within 180 days    Creatinine  Date Value Ref Range Status  06/12/2024 1.38 (H) 0.61 - 1.24 mg/dL Final   Creat  Date Value Ref Range Status  12/07/2023 1.03 0.70 - 1.30 mg/dL Final   Creatinine, Urine  Date Value Ref Range Status  12/07/2023 203 20 - 320 mg/dL Final         Failed - Na in normal range and within 180 days    Sodium  Date Value Ref Range Status  06/12/2024 131 (L) 135 - 145 mmol/L Final         Passed - K in normal range and within 180 days    Potassium  Date Value Ref Range Status  06/12/2024 3.9 3.5 - 5.1 mmol/L Final         Passed - Patient is not pregnant      Passed - Last BP in normal range    BP Readings from Last 1 Encounters:  06/12/24 130/80         Passed - Valid encounter within last 6 months    Recent Outpatient Visits           1 month ago Type 2 diabetes mellitus with diabetic mononeuropathy, without long-term current use of insulin  [facility])   [place] [facility] [place], Angeline ORN, NP   7 months ago Type 2 diabetes mellitus with diabetic mononeuropathy, without long-term current use of insulin  [facility])   [place] [facility] [place], Angeline ORN, [place]

## 2024-07-07 NOTE — Telephone Encounter (Signed)
 Last Rx sent to [facility] pharmacy on 06/29/24. Called pharmacy and staff reports Rx mailed / shipped to patient 1 week ago and delivered 07/03/24 at 4:31 pm.

## 2024-07-09 ENCOUNTER — Emergency Department

## 2024-07-09 ENCOUNTER — Observation Stay

## 2024-07-09 ENCOUNTER — Other Ambulatory Visit: Payer: Self-pay

## 2024-07-09 ENCOUNTER — Inpatient Hospital Stay
Admission: EM | Admit: 2024-07-09 | Discharge: 2024-07-14 | DRG: 189 | Disposition: A | Discharge status: Home Health | Attending: Family Medicine | Admitting: Family Medicine

## 2024-07-09 DIAGNOSIS — Z888 Allergy status to other drugs, medicaments and biological substances status: Secondary | ICD-10-CM

## 2024-07-09 DIAGNOSIS — R7989 Other specified abnormal findings of blood chemistry: Secondary | ICD-10-CM | POA: Diagnosis not present

## 2024-07-09 DIAGNOSIS — F1490 Cocaine use, unspecified, uncomplicated: Secondary | ICD-10-CM | POA: Diagnosis present

## 2024-07-09 DIAGNOSIS — Z833 Family history of diabetes mellitus: Secondary | ICD-10-CM

## 2024-07-09 DIAGNOSIS — R001 Bradycardia, unspecified: Secondary | ICD-10-CM | POA: Diagnosis not present

## 2024-07-09 DIAGNOSIS — Z7984 Long term (current) use of oral hypoglycemic drugs: Secondary | ICD-10-CM

## 2024-07-09 DIAGNOSIS — E1122 Type 2 diabetes mellitus with diabetic chronic kidney disease: Secondary | ICD-10-CM | POA: Diagnosis present

## 2024-07-09 DIAGNOSIS — T501X5A Adverse effect of loop [high-ceiling] diuretics, initial encounter: Secondary | ICD-10-CM | POA: Diagnosis not present

## 2024-07-09 DIAGNOSIS — R000 Tachycardia, unspecified: Secondary | ICD-10-CM | POA: Diagnosis not present

## 2024-07-09 DIAGNOSIS — Z79899 Other long term (current) drug therapy: Secondary | ICD-10-CM

## 2024-07-09 DIAGNOSIS — R918 Other nonspecific abnormal finding of lung field: Secondary | ICD-10-CM | POA: Diagnosis not present

## 2024-07-09 DIAGNOSIS — I130 Hypertensive heart and chronic kidney disease with heart failure and stage 1 through stage 4 chronic kidney disease, or unspecified chronic kidney disease: Secondary | ICD-10-CM | POA: Diagnosis present

## 2024-07-09 DIAGNOSIS — J189 Pneumonia, unspecified organism: Secondary | ICD-10-CM | POA: Diagnosis present

## 2024-07-09 DIAGNOSIS — C3412 Malignant neoplasm of upper lobe, left bronchus or lung: Secondary | ICD-10-CM | POA: Diagnosis present

## 2024-07-09 DIAGNOSIS — Z87891 Personal history of nicotine dependence: Secondary | ICD-10-CM

## 2024-07-09 DIAGNOSIS — Z9981 Dependence on supplemental oxygen: Secondary | ICD-10-CM

## 2024-07-09 DIAGNOSIS — Z825 Family history of asthma and other chronic lower respiratory diseases: Secondary | ICD-10-CM

## 2024-07-09 DIAGNOSIS — J9621 Acute and chronic respiratory failure with hypoxia: Secondary | ICD-10-CM | POA: Diagnosis not present

## 2024-07-09 DIAGNOSIS — Z515 Encounter for palliative care: Secondary | ICD-10-CM

## 2024-07-09 DIAGNOSIS — C7951 Secondary malignant neoplasm of bone: Secondary | ICD-10-CM | POA: Diagnosis present

## 2024-07-09 DIAGNOSIS — N183 Chronic kidney disease, stage 3 unspecified: Secondary | ICD-10-CM | POA: Diagnosis present

## 2024-07-09 DIAGNOSIS — Z993 Dependence on wheelchair: Secondary | ICD-10-CM

## 2024-07-09 DIAGNOSIS — E871 Hypo-osmolality and hyponatremia: Secondary | ICD-10-CM | POA: Diagnosis not present

## 2024-07-09 DIAGNOSIS — Z923 Personal history of irradiation: Secondary | ICD-10-CM

## 2024-07-09 DIAGNOSIS — F419 Anxiety disorder, unspecified: Secondary | ICD-10-CM | POA: Diagnosis present

## 2024-07-09 DIAGNOSIS — R0602 Shortness of breath: Secondary | ICD-10-CM | POA: Diagnosis not present

## 2024-07-09 DIAGNOSIS — J9811 Atelectasis: Secondary | ICD-10-CM | POA: Diagnosis present

## 2024-07-09 DIAGNOSIS — N179 Acute kidney failure, unspecified: Secondary | ICD-10-CM | POA: Diagnosis present

## 2024-07-09 DIAGNOSIS — R0902 Hypoxemia: Secondary | ICD-10-CM | POA: Diagnosis not present

## 2024-07-09 DIAGNOSIS — J440 Chronic obstructive pulmonary disease with acute lower respiratory infection: Secondary | ICD-10-CM | POA: Diagnosis present

## 2024-07-09 DIAGNOSIS — I5033 Acute on chronic diastolic (congestive) heart failure: Secondary | ICD-10-CM | POA: Diagnosis present

## 2024-07-09 DIAGNOSIS — Z1152 Encounter for screening for COVID-19: Secondary | ICD-10-CM

## 2024-07-09 DIAGNOSIS — Z7982 Long term (current) use of aspirin: Secondary | ICD-10-CM

## 2024-07-09 DIAGNOSIS — K861 Other chronic pancreatitis: Secondary | ICD-10-CM | POA: Diagnosis present

## 2024-07-09 DIAGNOSIS — F32A Depression, unspecified: Secondary | ICD-10-CM | POA: Diagnosis present

## 2024-07-09 DIAGNOSIS — I4510 Unspecified right bundle-branch block: Secondary | ICD-10-CM | POA: Diagnosis present

## 2024-07-09 DIAGNOSIS — I501 Left ventricular failure: Secondary | ICD-10-CM | POA: Diagnosis not present

## 2024-07-09 DIAGNOSIS — J849 Interstitial pulmonary disease, unspecified: Secondary | ICD-10-CM | POA: Diagnosis not present

## 2024-07-09 DIAGNOSIS — I10 Essential (primary) hypertension: Secondary | ICD-10-CM | POA: Diagnosis present

## 2024-07-09 DIAGNOSIS — E1141 Type 2 diabetes mellitus with diabetic mononeuropathy: Secondary | ICD-10-CM | POA: Diagnosis present

## 2024-07-09 DIAGNOSIS — J9601 Acute respiratory failure with hypoxia: Secondary | ICD-10-CM

## 2024-07-09 DIAGNOSIS — J439 Emphysema, unspecified: Secondary | ICD-10-CM | POA: Diagnosis not present

## 2024-07-09 LAB — COMPREHENSIVE METABOLIC PANEL WITH GFR
ALT: 7 U/L (ref 0–44)
AST: 27 U/L (ref 15–41)
Albumin: 3.6 g/dL (ref 3.5–5.0)
Alkaline Phosphatase: 85 U/L (ref 38–126)
Anion gap: 14 (ref 5–15)
BUN: 11 mg/dL (ref 6–20)
CO2: 18 mmol/L — ABNORMAL LOW (ref 22–32)
Calcium: 9 mg/dL (ref 8.9–10.3)
Chloride: 101 mmol/L (ref 98–111)
Creatinine, Ser: 1.72 mg/dL — ABNORMAL HIGH (ref 0.61–1.24)
GFR, Estimated: 45 mL/min — ABNORMAL LOW (ref >60)
Glucose, Bld: 82 mg/dL (ref 70–99)
Potassium: 4.2 mmol/L (ref 3.5–5.1)
Sodium: 133 mmol/L — ABNORMAL LOW (ref 135–145)
Total Bilirubin: 0.6 mg/dL (ref 0.0–1.2)
Total Protein: 9.8 g/dL — ABNORMAL HIGH (ref 6.5–8.1)

## 2024-07-09 LAB — CBC WITH DIFFERENTIAL/PLATELET
Abs Immature Granulocytes: 0.03 K/uL (ref 0.00–0.07)
Basophils Absolute: 0.1 K/uL (ref 0.0–0.1)
Basophils Relative: 1 %
Eosinophils Absolute: 0.4 K/uL (ref 0.0–0.5)
Eosinophils Relative: 5 %
HCT: 35.1 % — ABNORMAL LOW (ref 39.0–52.0)
Hemoglobin: 11.4 g/dL — ABNORMAL LOW (ref 13.0–17.0)
Immature Granulocytes: 0 %
Lymphocytes Relative: 13 %
Lymphs Abs: 1.2 K/uL (ref 0.7–4.0)
MCH: 33.5 pg (ref 26.0–34.0)
MCHC: 32.5 g/dL (ref 30.0–36.0)
MCV: 103.2 fL — ABNORMAL HIGH (ref 80.0–100.0)
Monocytes Absolute: 0.5 K/uL (ref 0.1–1.0)
Monocytes Relative: 6 %
Neutro Abs: 6.6 K/uL (ref 1.7–7.7)
Neutrophils Relative %: 75 %
Platelets: 243 K/uL (ref 150–400)
RBC: 3.4 MIL/uL — ABNORMAL LOW (ref 4.22–5.81)
RDW: 13.4 % (ref 11.5–15.5)
WBC: 8.8 K/uL (ref 4.0–10.5)
nRBC: 0 % (ref 0.0–0.2)

## 2024-07-09 LAB — RESP PANEL BY RT-PCR (RSV, FLU A&B, COVID)  RVPGX2
Influenza A by PCR: NEGATIVE
Influenza B by PCR: NEGATIVE
Resp Syncytial Virus by PCR: NEGATIVE
SARS Coronavirus 2 by RT PCR: NEGATIVE

## 2024-07-09 LAB — LACTIC ACID, PLASMA: Lactic Acid, Venous: 1.4 mmol/L (ref 0.5–1.9)

## 2024-07-09 LAB — MAGNESIUM: Magnesium: 2 mg/dL (ref 1.7–2.4)

## 2024-07-09 LAB — TROPONIN T, HIGH SENSITIVITY
Troponin T High Sensitivity: 60 ng/L — ABNORMAL HIGH (ref 0–19)
Troponin T High Sensitivity: 61 ng/L — ABNORMAL HIGH (ref 0–19)

## 2024-07-09 LAB — D-DIMER, QUANTITATIVE: D-Dimer, Quant: 5.01 ug{FEU}/mL — ABNORMAL HIGH (ref 0.00–0.50)

## 2024-07-09 LAB — PROCALCITONIN: Procalcitonin: <0.1 ng/mL

## 2024-07-09 LAB — GLUCOSE, CAPILLARY: Glucose-Capillary: 198 mg/dL — ABNORMAL HIGH (ref 70–99)

## 2024-07-09 LAB — PRO BRAIN NATRIURETIC PEPTIDE: Pro Brain Natriuretic Peptide: 1031 pg/mL — ABNORMAL HIGH (ref <300.0)

## 2024-07-09 MED ORDER — FUROSEMIDE 10 MG/ML IJ SOLN
20.0000 mg | Freq: Two times a day (BID) | INTRAMUSCULAR | Status: DC
  Administered 2024-07-09: 20 mg via INTRAVENOUS
  Filled 2024-07-09: qty 4

## 2024-07-09 MED ORDER — INSULIN ASPART 100 UNIT/ML IJ SOLN
0.0000 [IU] | Freq: Every day | INTRAMUSCULAR | Status: DC
  Filled 2024-07-09: qty 3

## 2024-07-09 MED ORDER — MORPHINE SULFATE (PF) 2 MG/ML IV SOLN
2.0000 mg | INTRAVENOUS | Status: DC | PRN
  Administered 2024-07-09 – 2024-07-14 (×9): 2 mg via INTRAVENOUS
  Filled 2024-07-09 (×9): qty 1

## 2024-07-09 MED ORDER — AMLODIPINE-OLMESARTAN 10-40 MG PO TABS: 1.0000 | ORAL_TABLET | Freq: Every day | ORAL | Status: DC

## 2024-07-09 MED ORDER — PANCRELIPASE (LIP-PROT-AMYL) 12000-38000 UNITS PO CPEP
12000.0000 [IU] | ORAL_CAPSULE | Freq: Three times a day (TID) | ORAL | Status: DC
  Administered 2024-07-10 – 2024-07-14 (×14): 12000 [IU] via ORAL
  Filled 2024-07-09 (×15): qty 1

## 2024-07-09 MED ORDER — IRBESARTAN 150 MG PO TABS
300.0000 mg | ORAL_TABLET | Freq: Every day | ORAL | Status: DC
  Administered 2024-07-10: 300 mg via ORAL
  Filled 2024-07-09: qty 2

## 2024-07-09 MED ORDER — SODIUM CHLORIDE 0.9 % IV SOLN
500.0000 mg | INTRAVENOUS | Status: DC
  Administered 2024-07-10 – 2024-07-11 (×2): 500 mg via INTRAVENOUS
  Filled 2024-07-09 (×2): qty 5

## 2024-07-09 MED ORDER — MORPHINE SULFATE (PF) 4 MG/ML IV SOLN
4.0000 mg | Freq: Once | INTRAVENOUS | Status: AC
  Administered 2024-07-09: 4 mg via INTRAVENOUS
  Filled 2024-07-09: qty 1

## 2024-07-09 MED ORDER — SODIUM CHLORIDE 0.9 % IV BOLUS (SEPSIS)
1000.0000 mL | Freq: Once | INTRAVENOUS | Status: AC
  Administered 2024-07-09: 1000 mL via INTRAVENOUS

## 2024-07-09 MED ORDER — ACETAMINOPHEN 325 MG PO TABS
650.0000 mg | ORAL_TABLET | Freq: Four times a day (QID) | ORAL | Status: DC | PRN
  Administered 2024-07-12: 650 mg via ORAL
  Filled 2024-07-09: qty 2

## 2024-07-09 MED ORDER — METHYLPREDNISOLONE SODIUM SUCC 40 MG IJ SOLR
40.0000 mg | Freq: Two times a day (BID) | INTRAMUSCULAR | Status: AC
  Administered 2024-07-10 (×2): 40 mg via INTRAVENOUS
  Filled 2024-07-09 (×2): qty 1

## 2024-07-09 MED ORDER — GUAIFENESIN ER 600 MG PO TB12
600.0000 mg | ORAL_TABLET | Freq: Two times a day (BID) | ORAL | Status: DC
  Administered 2024-07-09 – 2024-07-10 (×2): 600 mg via ORAL
  Filled 2024-07-09 (×2): qty 1

## 2024-07-09 MED ORDER — SODIUM CHLORIDE 0.9 % IV SOLN
2.0000 g | Freq: Once | INTRAVENOUS | Status: AC
  Administered 2024-07-09: 2 g via INTRAVENOUS
  Filled 2024-07-09: qty 20

## 2024-07-09 MED ORDER — AMLODIPINE BESYLATE 10 MG PO TABS
10.0000 mg | ORAL_TABLET | Freq: Every day | ORAL | Status: DC
  Administered 2024-07-10 – 2024-07-12 (×3): 10 mg via ORAL
  Filled 2024-07-09 (×4): qty 1

## 2024-07-09 MED ORDER — PRAVASTATIN SODIUM 20 MG PO TABS
20.0000 mg | ORAL_TABLET | Freq: Every day | ORAL | Status: DC
  Administered 2024-07-10 – 2024-07-13 (×4): 20 mg via ORAL
  Filled 2024-07-09 (×4): qty 1

## 2024-07-09 MED ORDER — IPRATROPIUM-ALBUTEROL 0.5-2.5 (3) MG/3ML IN SOLN
6.0000 mL | Freq: Once | RESPIRATORY_TRACT | Status: AC
  Administered 2024-07-09: 6 mL via RESPIRATORY_TRACT
  Filled 2024-07-09: qty 6

## 2024-07-09 MED ORDER — HYDROCODONE-ACETAMINOPHEN 5-325 MG PO TABS
1.0000 | ORAL_TABLET | ORAL | Status: DC | PRN
  Administered 2024-07-09 – 2024-07-10 (×3): 2 via ORAL
  Administered 2024-07-12: 1 via ORAL
  Administered 2024-07-12: 2 via ORAL
  Administered 2024-07-13: 1 via ORAL
  Administered 2024-07-14: 2 via ORAL
  Filled 2024-07-09 (×2): qty 1
  Filled 2024-07-09 (×5): qty 2

## 2024-07-09 MED ORDER — ONDANSETRON HCL 4 MG/2ML IJ SOLN: 4.0000 mg | Freq: Four times a day (QID) | INTRAMUSCULAR | Status: DC | PRN

## 2024-07-09 MED ORDER — SODIUM CHLORIDE 0.9 % IV SOLN
2.0000 g | INTRAVENOUS | Status: DC
  Administered 2024-07-10: 2 g via INTRAVENOUS
  Filled 2024-07-09 (×2): qty 20

## 2024-07-09 MED ORDER — EMPAGLIFLOZIN 10 MG PO TABS
10.0000 mg | ORAL_TABLET | Freq: Every day | ORAL | Status: DC
  Administered 2024-07-10 – 2024-07-14 (×5): 10 mg via ORAL
  Filled 2024-07-09 (×5): qty 1

## 2024-07-09 MED ORDER — LACTATED RINGERS IV SOLN: INTRAVENOUS | Status: DC

## 2024-07-09 MED ORDER — PANTOPRAZOLE SODIUM 40 MG PO TBEC
40.0000 mg | DELAYED_RELEASE_TABLET | Freq: Every day | ORAL | Status: DC
  Administered 2024-07-10 – 2024-07-14 (×5): 40 mg via ORAL
  Filled 2024-07-09 (×5): qty 1

## 2024-07-09 MED ORDER — ONDANSETRON HCL 4 MG PO TABS
4.0000 mg | ORAL_TABLET | Freq: Four times a day (QID) | ORAL | Status: DC | PRN
Start: 2024-07-09 — End: 2024-07-14

## 2024-07-09 MED ORDER — METHYLPREDNISOLONE SODIUM SUCC 125 MG IJ SOLR
125.0000 mg | Freq: Once | INTRAMUSCULAR | Status: AC
  Administered 2024-07-09: 125 mg via INTRAVENOUS
  Filled 2024-07-09: qty 2

## 2024-07-09 MED ORDER — SODIUM CHLORIDE 0.9 % IV SOLN
500.0000 mg | Freq: Once | INTRAVENOUS | Status: AC
  Administered 2024-07-09: 500 mg via INTRAVENOUS
  Filled 2024-07-09: qty 5

## 2024-07-09 MED ORDER — IPRATROPIUM-ALBUTEROL 0.5-2.5 (3) MG/3ML IN SOLN
3.0000 mL | Freq: Four times a day (QID) | RESPIRATORY_TRACT | Status: DC
  Administered 2024-07-09 – 2024-07-10 (×3): 3 mL via RESPIRATORY_TRACT
  Filled 2024-07-09 (×3): qty 3

## 2024-07-09 MED ORDER — SERTRALINE HCL 50 MG PO TABS
100.0000 mg | ORAL_TABLET | Freq: Every day | ORAL | Status: DC
  Administered 2024-07-10 – 2024-07-14 (×5): 100 mg via ORAL
  Filled 2024-07-09 (×5): qty 2

## 2024-07-09 MED ORDER — TRAZODONE HCL 100 MG PO TABS
150.0000 mg | ORAL_TABLET | Freq: Every evening | ORAL | Status: DC | PRN
  Administered 2024-07-10 – 2024-07-11 (×2): 150 mg via ORAL
  Filled 2024-07-09 (×2): qty 1

## 2024-07-09 MED ORDER — ENOXAPARIN SODIUM 40 MG/0.4ML IJ SOSY
40.0000 mg | PREFILLED_SYRINGE | INTRAMUSCULAR | Status: DC
  Administered 2024-07-09 – 2024-07-13 (×5): 40 mg via SUBCUTANEOUS
  Filled 2024-07-09 (×5): qty 0.4

## 2024-07-09 MED ORDER — ACETAMINOPHEN 650 MG RE SUPP
650.0000 mg | Freq: Four times a day (QID) | RECTAL | Status: DC | PRN
Start: 2024-07-09 — End: 2024-07-14

## 2024-07-09 MED ORDER — SPIRONOLACTONE 25 MG PO TABS
25.0000 mg | ORAL_TABLET | Freq: Every day | ORAL | Status: DC
  Administered 2024-07-10 – 2024-07-14 (×5): 25 mg via ORAL
  Filled 2024-07-09 (×5): qty 1

## 2024-07-09 MED ORDER — ALBUTEROL SULFATE (2.5 MG/3ML) 0.083% IN NEBU
2.5000 mg | INHALATION_SOLUTION | RESPIRATORY_TRACT | Status: DC | PRN
  Administered 2024-07-10: 2.5 mg via RESPIRATORY_TRACT
  Filled 2024-07-09: qty 3

## 2024-07-09 MED ORDER — IOHEXOL 350 MG/ML SOLN
75.0000 mL | Freq: Once | INTRAVENOUS | Status: AC | PRN
  Administered 2024-07-09: 75 mL via INTRAVENOUS

## 2024-07-09 MED ORDER — INSULIN ASPART 100 UNIT/ML IJ SOLN
0.0000 [IU] | Freq: Three times a day (TID) | INTRAMUSCULAR | Status: DC
  Administered 2024-07-10 (×3): 5 [IU] via SUBCUTANEOUS
  Administered 2024-07-11: 3 [IU] via SUBCUTANEOUS
  Administered 2024-07-11: 2 [IU] via SUBCUTANEOUS
  Administered 2024-07-11 – 2024-07-12 (×3): 3 [IU] via SUBCUTANEOUS
  Administered 2024-07-13: 2 [IU] via SUBCUTANEOUS
  Administered 2024-07-13 – 2024-07-14 (×2): 3 [IU] via SUBCUTANEOUS
  Filled 2024-07-09: qty 2
  Filled 2024-07-09: qty 3
  Filled 2024-07-09: qty 5
  Filled 2024-07-09 (×2): qty 3
  Filled 2024-07-09: qty 5
  Filled 2024-07-09: qty 3
  Filled 2024-07-09: qty 2
  Filled 2024-07-09 (×2): qty 3
  Filled 2024-07-09: qty 5

## 2024-07-09 MED ORDER — PREDNISONE 20 MG PO TABS
40.0000 mg | ORAL_TABLET | Freq: Every day | ORAL | Status: AC
  Administered 2024-07-11 – 2024-07-14 (×4): 40 mg via ORAL
  Filled 2024-07-09 (×4): qty 2

## 2024-07-09 NOTE — Assessment & Plan Note (Signed)
 Continue home amlodipine /olmesartan  and spironolactone 

## 2024-07-09 NOTE — H&P (Signed)
 History and Physical    Patient: Noah Velasquez FMW:[id number] DOB: 1965-12-07 DOA: 07/09/2024 DOS: the patient was seen and examined on 07/09/2024 PCP: Antonette Angeline ORN, NP  Patient coming from: Home  Chief Complaint:  Chief Complaint  Patient presents with   Shortness of Breath    HPI: Noah Velasquez is a 58 y.o. 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  He presented with a 1 week history of cough productive of grayish black phlegm, dyspnea on exertion, not improving with home meds.  He denies fever or chills, chest pain, lower extremity pain or swelling.  On arrival of EMS he was satting at 75% on O2 on home flow rate of 2 L and was bumped up to 5 L. Upon arrival, he was afebrile, tachycardic to the 100, tachypneic to the low 20s with O2 sats 92% on 4 L requiring an increased bump up to 5 L. Labs notable for normal WBC of 8 and lactic acid 1.4 with negative respiratory viral panel, procalcitonin pending. Troponin 60->61 and proBNP of 1000 Hemoglobin at baseline at 11.4 and creatinine 1.72, up from baseline of 0.91 a couple months prior EKG showed sinus tachycardia at 103 with RBBB Chest x-ray showed interval increase in basilar predominant interstitial prominence-pulmonary edema versus atypical infection superimposed on a background of interstitial lung disease  Patient treated with additional DuoNebs, methylprednisolone  and started on Rocephin  and Flagyl  and given an NS bolus Admission requested     Past Medical History:  Diagnosis Date   Alcohol  use disorder    Aortic atherosclerosis    Bronchiolo-alveolar adenocarcinoma of left lung  (HCC)    CHF (congestive heart failure) (HCC)    Chronic hip pain    Chronic pancreatitis (HCC)    Cigarette smoker    CKD (chronic kidney disease) stage 3, GFR 30-59 ml/min (HCC)    COPD (chronic obstructive pulmonary disease) (HCC)    Crack cocaine use    as of 01-21-24, pt states he last used a few weeks ago   Depression    DM (diabetes mellitus), type 2 (HCC)    Dyspnea    ED (erectile dysfunction)    ESBL (extended spectrum beta-lactamase) producing bacteria infection 2021   GERD (gastroesophageal reflux disease)    Hyperlipidemia    Hypertension    ILD (interstitial lung disease) (HCC)    Marijuana use    Osteoarthritis    Pneumonia    PTSD (post-traumatic stress disorder)    Seizure (HCC)    x1 years ago   Viral infection of left eye    Past Surgical History:  Procedure Laterality Date   BRONCHOSCOPY, WITH BIOPSY USING ELECTROMAGNETIC NAVIGATION Left 01/24/2024   Procedure: BRONCHOSCOPY, WITH BIOPSY USING ELECTROMAGNETIC NAVIGATION;  Surgeon: Isaiah Scrivener, MD;  Location: [facility] ORS;  Service: Pulmonary;  Laterality: Left;   ENDOBRONCHIAL ULTRASOUND Right 01/24/2024   Procedure: ENDOBRONCHIAL ULTRASOUND (EBUS);  Surgeon: Kasa, Kurian, MD;  Location: [facility] ORS;  Service: Pulmonary;  Laterality: Right;   IR IMAGING GUIDED PORT INSERTION  03/15/2024   pancreatic stent placement     Social History:  reports that he has quit smoking. His smoking use included cigarettes. He has a 45 pack-year smoking  history. He has never used smokeless tobacco. He reports current alcohol  use of about 7.0 standard drinks of alcohol  per week. He reports current drug use. Drugs: Marijuana and Crack cocaine.  Allergies  Allergen Reactions   Hctz [Hydrochlorothiazide] Swelling   Ibuprofen Swelling    Family History  Problem Relation Age of Onset   Diabetes Mother    Sleep apnea Mother    Emphysema Sister    CAD Neg Hx    Mental illness Neg Hx     Prior to Admission medications   Medication  Sig Start Date End Date Taking? Authorizing Provider  Accu-Chek Softclix Lancets lancets Use with blood glucose monitor in the morning, at noon, and at bedtime. May substitute to any manufacturer covered by patient's insurance. 02/29/24   Brahmanday, Govinda R, MD  amLODipine -olmesartan  (AZOR ) 10-40 MG tablet Take 1 tablet by mouth daily. 06/29/24   Antonette Angeline ORN, NP  aspirin  EC 81 MG tablet Take 1 tablet (81 mg total) by mouth daily. Swallow whole. Patient not taking: Reported on 06/13/2024 06/06/24   Antonette [place] ORN, NP  Blood Glucose Monitoring Suppl (BLOOD GLUCOSE MONITOR SYSTEM) w/Device KIT Check blood sugar in the morning, at noon, and at bedtime. May substitute to any manufacturer covered by patient's insurance. 02/29/24   Brahmanday, Govinda R, MD  empagliflozin  (JARDIANCE ) 10 MG TABS tablet Take 1 tablet (10 mg total) by mouth daily before breakfast. 02/24/24   Donette Ellouise LABOR, FNP  ipratropium-albuterol  (DUONEB) 0.5-2.5 (3) MG/3ML SOLN Take 3 mLs by nebulization every 2 (two) hours as needed (wheeze, SOB). Can use instead of albuterol  inhaler 02/23/24   Antonette Angeline ORN, NP  lovastatin  (MEVACOR ) 10 MG tablet Take 1 tablet (10 mg total) by mouth at bedtime. 01/25/24   Antonette Ang[place] ORN, NP  metFORMIN  (GLUCOPHAGE ) 500 MG tablet Take 1 tablet (500 mg total) by mouth daily with breakfast. Patient taking differently: Take 500 mg by mouth 2 (two) times daily with a meal. 06/06/24   Antonette [place] ORN, NP  Nebulizers (COMPRESSOR/NEBULIZER) MISC Nebulizer and tubes/mask to use as directed with DuoNeb as needed 06/03/22   Alexander, Natalie, DO  omeprazole  (PRILOSEC) 20 MG capsule Take 1 capsule (20 mg total) by mouth daily. 01/25/24   Antonette Ang[place] ORN, NP  OXYGEN  Inhale 2 L into the lungs as needed.    [provider]  Pancrelipase , Lip-Prot-Amyl, (ZENPEP ) 15000-47000 units CPEP Take 1 capsule (15,000 Units total) by mouth 3 (three) times daily. 12/07/23   Antonette Angeline ORN, NP  sertraline  (ZOLOFT ) 100  MG tablet TAKE 1 TABLET BY MOUTH ONCE DAILY 05/10/24   Antonette Angeline ORN, NP  spironolactone  (ALDACTONE ) 25 MG tablet Take 1 tablet (25 mg total) by mouth daily. 02/24/24 08/22/24  Donette Ellouise LABOR, FNP  traZODone  (DESYREL ) 150 MG tablet Take 1 tablet (150 mg total) by mouth at bedtime as needed for sleep. Patient not taking: Reported on 06/12/2024 01/25/24   Antonette Angeline ORN, NP  VENTOLIN  HFA 108 (90 Base) MCG/ACT inhaler Inhale 1-2 puffs into the lungs every 6 (six) hours as needed for wheezing or shortness of breath (or coughing). 02/15/24   Isaiah Scrivener, MD    Physical Exam: Vitals:   07/09/24 1800 07/09/24 1815 07/09/24 1855 07/09/24 1858  BP: (!) 144/84     Pulse: 89 93 100 100  Resp: (!) 21 19 20 15   Temp:      TempSrc:      SpO2: 91% 94% 95% 97%  Weight:  Height:       Physical Exam  Labs on Admission: I have personally reviewed following labs and imaging studies  CBC: Recent Labs  Lab 07/09/24 1740  WBC 8.8  NEUTROABS 6.6  HGB 11.4*  HCT 35.1*  MCV 103.2*  PLT 243   Basic Metabolic Panel: Recent Labs  Lab 07/09/24 1740  NA 133*  K 4.2  CL 101  CO2 18*  GLUCOSE 82  BUN 11  CREATININE 1.72*  CALCIUM  9.0  MG 2.0   GFR: Estimated Creatinine Clearance: 43.2 mL/min (A) (by C-G formula based on SCr of 1.72 mg/dL (H)). Liver Function Tests: Recent Labs  Lab 07/09/24 1740  AST 27  ALT 7  ALKPHOS 85  BILITOT 0.6  PROT 9.8*  ALBUMIN 3.6   No results for input(s): LIPASE, AMYLASE in the last 168 hours. No results for input(s): AMMONIA in the last 168 hours. Coagulation Profile: No results for input(s): INR, PROTIME in the last 168 hours. Cardiac Enzymes: No results for input(s): CKTOTAL, CKMB, CKMBINDEX, TROPONINI in the last 168 hours. BNP (last 3 results) Recent Labs    07/09/24 1740  PROBNP 1,031.0*   HbA1C: No results for input(s): HGBA1C in the last 72 hours. CBG: No results for input(s): GLUCAP in the last 168 hours. Lipid  Profile: No results for input(s): CHOL, HDL, LDLCALC, TRIG, CHOLHDL, LDLDIRECT in the last 72 hours. Thyroid  Function Tests: No results for input(s): TSH, T4TOTAL, FREET4, T3FREE, THYROIDAB in the last 72 hours. Anemia Panel: No results for input(s): VITAMINB12, FOLATE, FERRITIN, TIBC, IRON, RETICCTPCT in the last 72 hours. Urine analysis:    Component Value Date/Time   COLORURINE COLORLESS (A) 01/15/2024 1142   APPEARANCEUR CLEAR (A) 01/15/2024 1142   LABSPEC 1.007 01/15/2024 1142   PHURINE 5.0 01/15/2024 1142   GLUCOSEU >=500 (A) 01/15/2024 1142   HGBUR NEGATIVE 01/15/2024 1142   BILIRUBINUR NEGATIVE 01/15/2024 1142   BILIRUBINUR Negative 10/20/2018 1443   KETONESUR NEGATIVE 01/15/2024 1142   PROTEINUR NEGATIVE 01/15/2024 1142   UROBILINOGEN 0.2 10/20/2018 1443   NITRITE NEGATIVE 01/15/2024 1142   LEUKOCYTESUR NEGATIVE 01/15/2024 1142    Radiological Exams on Admission: DG Chest Portable 1 View Result Date: 07/09/2024 CLINICAL DATA:  SOB, hx ILD, CHF, LUL cancer EXAM: PORTABLE CHEST 1 VIEW COMPARISON:  January 24, 2024 FINDINGS: The cardiomediastinal silhouette is unchanged in contour.RIGHT chest port with tip terminating over the superior cavoatrial junction. No pleural effusion. No pneumothorax. Interval increase in a basilar predominant interstitial prominence. Known LEFT upper lobe malignancy is not well visualized radiographically and is favored decreased in size. IMPRESSION: Interval increase in a basilar predominant interstitial prominence. Differential considerations include pulmonary edema versus atypical infection superimposed on a background of interstitial lung disease. Electronically Signed   By: Corean Salter M.D.   On: 07/09/2024 17:58   Data Reviewed for HPI: Relevant notes from primary care and specialist visits, past discharge summaries as available in EHR, including Care Everywhere. Prior diagnostic testing as pertinent to current  admission diagnoses Updated medications and problem lists for reconciliation ED course, including vitals, labs, imaging, treatment and response to treatment Triage notes, nursing and pharmacy notes and ED provider's notes Notable results as noted above in HPI      Assessment and Plan: * Acute on chronic respiratory failure with hypoxia (HCC) Multifactorial: CAP/CHF exacerbation superimposed on ILD with exacerbation in the setting of stage IV lung cancer Patient presents with acute respiratory distress 1 week of productive cough, saturating at 75% on baseline  O2 rate of 2 L Chest x-ray showing interstitial predominance pulmonary edema versus atypical infection Low suspicion for acute PE but given cancer history, will get D-dimer with further workup if indicated  Currently on 5 L-continue and wean as tolerated Treat each respective problem-please refer to problem list  CAP (community acquired pneumonia) SIRS, sepsis not suspected Interstitial lung disease on 2 L at baseline Patient with productive cough, no fever no chills tachycardic and tachypneic but normal WBC and lactic acid Chest x-ray showing possible atypical infection Rocephin  and azithromycin  Scheduled and as needed DuoNebs Steroids Incentive spirometer and flutter valve  Acute on chronic heart failure with preserved ejection fraction (HFpEF) (HCC) Patient with dyspnea on exertion, possible pulmonary edema on chest x-ray and proBNP over thousand Follows at the heart failure clinic, last EF 50 to 55% in May Empiric treatment with Lasix  Continue spironolactone  Update echocardiogram  Essential hypertension Continue home amlodipine /olmesartan  and spironolactone   AKI (acute kidney injury) Creatinine 1.72 up from baseline of 0.91 Suspecting dehydration from poor oral intake in combination with spironolactone  for CHF Monitor renal function and avoid nephrotoxins  Type 2 diabetes mellitus with diabetic mononeuropathy,  without long-term current use of insulin  (HCC) Sliding scale insulin  coverage  Anxiety and depression Continue sertraline  and trazodone   Chronic pancreatitis (HCC) Continue Pancreaze  and omeprazole     DVT prophylaxis: Lovenox   Consults: none  Advance Care Planning:   Code Status: Prior   Family Communication: none  Disposition Plan: Back to previous home environment  Severity of Illness: The appropriate patient status for this patient is OBSERVATION. Observation status is judged to be reasonable and necessary in order to provide the required intensity of service to ensure the patient's safety. The patient's presenting symptoms, physical exam findings, and initial radiographic and laboratory data in the context of their medical condition is felt to place them at decreased risk for further clinical deterioration. Furthermore, it is anticipated that the patient will be medically stable for discharge from the hospital within 2 midnights of admission.   Author: Delayne LULLA Solian, MD 07/09/2024 8:08 PM  For on call review [email or web address].

## 2024-07-09 NOTE — ED Notes (Signed)
 CCMD was called at this time to transfer pt from rm 18 to rm 35.

## 2024-07-09 NOTE — Assessment & Plan Note (Addendum)
 Multifactorial: CAP/CHF exacerbation superimposed on ILD with exacerbation in the setting of stage IV lung cancer Patient presents with acute respiratory distress 1 week of productive cough, saturating at 75% on baseline O2 rate of 2 L Chest x-ray showing interstitial predominance pulmonary edema versus atypical infection Low suspicion for acute PE but given cancer history, will get D-dimer with further workup if indicated  Currently on 5 L-continue and wean as tolerated Treat each respective problem-please refer to problem list

## 2024-07-09 NOTE — Assessment & Plan Note (Signed)
Continue sertraline and trazodone 

## 2024-07-09 NOTE — Assessment & Plan Note (Addendum)
 SIRS, sepsis not suspected Interstitial lung disease on 2 L at baseline Patient with productive cough, no fever no chills tachycardic and tachypneic but normal WBC and lactic acid Chest x-ray showing possible atypical infection Rocephin  and azithromycin  Scheduled and as needed DuoNebs Will hold off on steroids in the setting of acute infection And atelectasis Incentive spirometer and flutter valve

## 2024-07-09 NOTE — ED Triage Notes (Signed)
 Pt via [facility] from home. Pt c/o SOB and cough for the past week and a half. Reports that he has had a productive cough, sputum is gray and black per pt. Reports he has a hx of COPD. Reports some chills also. Denies any sick contact. Pt wears 2L [place] at baseline, EMS reports on arrival pt initial sat was 75% on 2L, placed on 4L [place] and O2 increased to 97%. Pt reports chronic pain in his hip but denies any other pain. Pt is A&Ox4 and NAD, Dr. Claudene at beside upon arrival.

## 2024-07-09 NOTE — Assessment & Plan Note (Signed)
 Sliding scale insulin  coverage

## 2024-07-09 NOTE — Assessment & Plan Note (Signed)
 Continue Pancreaze  and omeprazole 

## 2024-07-09 NOTE — ED Provider Notes (Addendum)
 [facility] Provider Note    Event Date/Time   First MD Initiated Contact with Patient 07/09/24 1723     (approximate)   History   Shortness of Breath   HPI  Michaelpaul Apo is a 58 y.o. 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  Wheelchair-bound mostly but lives independently.  Completed concurrent chemoradiation, palliative radiation to metastasis on his left humerus, maintenance durvalumab   Patient presents to the ED for evaluation of 1 week of shortness of breath, dyspnea on exertion and productive cough, subjective fevers and chills   Physical Exam   Triage Vital Signs: ED Triage Vitals  Encounter Vitals Group     BP      Girls Systolic BP Percentile      Girls Diastolic BP Percentile      Boys Systolic BP Percentile      Boys Diastolic BP Percentile      Pulse      Resp      Temp      Temp src      SpO2      Weight      Height      Head Circumference      Peak Flow      Pain Score      Pain Loc      Pain Education      Exclude from Growth Chart     Most recent vital signs: Vitals:   07/09/24 1855 07/09/24 1858  BP:    Pulse: 100 100  Resp: 20 15  Temp:    SpO2: 95% 97%    General: Awake, no distress.  CV:  Good peripheral perfusion.  Resp:  Mild tachypnea, wheezing throughout, slightly decreased airflow Abd:  No distention.  MSK:  No deformity noted.  No significant edema Neuro:  No focal deficits appreciated. Other:     ED Results / Procedures / Treatments   Labs (all labs ordered are listed, but only abnormal results are displayed) Labs Reviewed  COMPREHENSIVE METABOLIC PANEL WITH GFR - Abnormal; Notable for the following components:      Result Value   Sodium 133 (*)    CO2 18 (*)    Creatinine, Ser 1.72 (*)    Total Protein 9.8 (*)    GFR, Estimated 45 (*)     All other components within normal limits  CBC WITH DIFFERENTIAL/PLATELET - Abnormal; Notable for the following components:   RBC 3.40 (*)    Hemoglobin 11.4 (*)    HCT 35.1 (*)    MCV 103.2 (*)    All other components within normal limits  PRO BRAIN NATRIURETIC PEPTIDE - Abnormal; Notable for the following components:   Pro Brain Natriuretic Peptide 1,031.0 (*)    All other components within normal limits  TROPONIN T, HIGH SENSITIVITY - Abnormal; Notable for the following components:   Troponin T High Sensitivity 60 (*)    All other components within normal limits  RESP PANEL BY RT-PCR (RSV, FLU A&B, COVID)  RVPGX2  CULTURE, BLOOD (ROUTINE X 2)  CULTURE, BLOOD (ROUTINE X 2)  MAGNESIUM   LACTIC ACID, PLASMA  PROCALCITONIN  TROPONIN T, HIGH SENSITIVITY    EKG Sinus rhythm with a rate of 103 bpm, right bundle, nonspecific ST changes.  No STEMI.  RADIOLOGY 1 view CXR interpreted by  me with increased interstitial opacities bilaterally  Official radiology report(s): DG Chest Portable 1 View Result Date: 07/09/2024 CLINICAL DATA:  SOB, hx ILD, CHF, LUL cancer EXAM: PORTABLE CHEST 1 VIEW COMPARISON:  January 24, 2024 FINDINGS: The cardiomediastinal silhouette is unchanged in contour.RIGHT chest port with tip terminating over the superior cavoatrial junction. No pleural effusion. No pneumothorax. Interval increase in a basilar predominant interstitial prominence. Known LEFT upper lobe malignancy is not well visualized radiographically and is favored decreased in size. IMPRESSION: Interval increase in a basilar predominant interstitial prominence. Differential considerations include pulmonary edema versus atypical infection superimposed on a background of interstitial lung disease. Electronically Signed   By: Corean Salter M.D.   On: 07/09/2024 17:58    PROCEDURES and INTERVENTIONS:  .Critical Care  Performed by: Claudene Rover, MD Authorized by: Claudene Rover, MD   Critical care  provider statement:    Critical care time (minutes):  30   Critical care time was exclusive of:  Separately billable procedures and treating other patients   Critical care was necessary to treat or prevent imminent or life-threatening deterioration of the following conditions:  Respiratory failure and sepsis   Critical care was time spent personally by me on the following activities:  Development of treatment plan with patient or surrogate, discussions with consultants, evaluation of patient's response to treatment, examination of patient, ordering and review of laboratory studies, ordering and review of radiographic studies, ordering and performing treatments and interventions, pulse oximetry, re-evaluation of patient's condition and review of old charts .1-3 Lead EKG Interpretation  Performed by: Claudene Rover, MD Authorized by: Claudene Rover, MD     Interpretation: abnormal     ECG rate:  104   ECG rate assessment: tachycardic     Rhythm: sinus tachycardia     Ectopy: none     Conduction: normal     Medications  azithromycin  (ZITHROMAX ) 500 mg in sodium chloride  0.9 % 250 mL IVPB (500 mg Intravenous New Bag/Given 07/09/24 1919)  lactated ringers  infusion (has no administration in time range)  sodium chloride  0.9 % bolus 1,000 mL (1,000 mLs Intravenous New Bag/Given 07/09/24 1924)  ipratropium-albuterol  (DUONEB) 0.5-2.5 (3) MG/3ML nebulizer solution 6 mL (6 mLs Nebulization Given 07/09/24 1815)  methylPREDNISolone  sodium succinate (SOLU-MEDROL ) 125 mg/2 mL injection 125 mg (125 mg Intravenous Given 07/09/24 1815)  cefTRIAXone  (ROCEPHIN ) 2 g in sodium chloride  0.9 % 100 mL IVPB (0 g Intravenous Stopped 07/09/24 1915)  morphine  (PF) 4 MG/ML injection 4 mg (4 mg Intravenous Given 07/09/24 1813)     IMPRESSION / MDM / ASSESSMENT AND PLAN / ED COURSE  I reviewed the triage vital signs and the nursing notes.  Differential diagnosis includes, but is not limited to, ACS, PTX, PNA, muscle  strain/spasm, PE, dissection, anxiety, pleural effusion  {Patient presents with symptoms of an acute illness or injury that is potentially life-threatening.  Patient with lung cancer and ILD presents with productive cough, wheezing and signs of ILD exacerbation and atypical pneumonia requiring medical admission.  Mild worsening of renal function, no leukocytosis but tachycardia and tachypnea or SIRS criteria.  He has signs of possible atypical infection on x-ray and with his preceding productive cough this is a concern.  He has started on antibiotics, cultures are drawn.  Start on steroids, provide DuoNebs with improvement of his respiratory status.  Consult with medicine for admission  Clinical Course as of 07/09/24 1951  Sun Jul 09, 2024  1859 Reassessed, remains on 4 L nasal cannula and sats  are only 90%, getting bumped up to 5 L as I am in there.  Discussed x-ray results, continued hypoxia, he is still wheezing and tachypneic.  Will consult medicine for admission. [DS]  [id number] Consult with medicine who agrees to admit. [DS]    Clinical Course User Index [DS] Claudene Rover, MD     FINAL CLINICAL IMPRESSION(S) / ED DIAGNOSES   Final diagnoses:  ILD (interstitial lung disease) (HCC)  Acute respiratory failure with hypoxia (HCC)  Atypical pneumonia     Rx / DC Orders   ED Discharge Orders     None        Note:  This document was prepared using Dragon voice recognition software and may include unintentional dictation errors.   Claudene Rover, MD 07/09/24 RETHA    Claudene Rover, MD 07/09/24 [phone]

## 2024-07-09 NOTE — Progress Notes (Signed)
 CODE SEPSIS - PHARMACY COMMUNICATION  **Broad Spectrum Antibiotics should be administered within 1 hour of Sepsis diagnosis**  Time Code Sepsis Called/Page Received: 1901  Antibiotics Ordered: Azithromycin  and ceftriaxone   Time of 1st antibiotic administration: 1832  Additional action taken by pharmacy: -  If necessary, Name of Provider/Nurse Contacted: -    Lum VEAR Mania ,PharmD Clinical Pharmacist  07/09/2024  7:01 PM

## 2024-07-09 NOTE — Sepsis Progress Note (Signed)
 Following for sepsis monitoring ?

## 2024-07-09 NOTE — Hospital Course (Signed)
 Noah Velasquez

## 2024-07-09 NOTE — Assessment & Plan Note (Signed)
 Creatinine 1.72 up from baseline of 0.91 Suspecting dehydration from poor oral intake in combination with spironolactone  for CHF Monitor renal function and avoid nephrotoxins

## 2024-07-09 NOTE — Assessment & Plan Note (Signed)
 Patient with dyspnea on exertion, possible pulmonary edema on chest x-ray and proBNP over thousand Follows at the heart failure clinic, last EF 50 to 55% in May Empiric treatment with Lasix  Continue spironolactone  Update echocardiogram

## 2024-07-10 ENCOUNTER — Inpatient Hospital Stay

## 2024-07-10 ENCOUNTER — Inpatient Hospital Stay: Admitting: Nurse Practitioner

## 2024-07-10 DIAGNOSIS — Z833 Family history of diabetes mellitus: Secondary | ICD-10-CM | POA: Diagnosis not present

## 2024-07-10 DIAGNOSIS — Z7984 Long term (current) use of oral hypoglycemic drugs: Secondary | ICD-10-CM | POA: Diagnosis not present

## 2024-07-10 DIAGNOSIS — I130 Hypertensive heart and chronic kidney disease with heart failure and stage 1 through stage 4 chronic kidney disease, or unspecified chronic kidney disease: Secondary | ICD-10-CM | POA: Diagnosis not present

## 2024-07-10 DIAGNOSIS — Z1152 Encounter for screening for COVID-19: Secondary | ICD-10-CM | POA: Diagnosis not present

## 2024-07-10 DIAGNOSIS — J189 Pneumonia, unspecified organism: Secondary | ICD-10-CM | POA: Diagnosis not present

## 2024-07-10 DIAGNOSIS — N289 Disorder of kidney and ureter, unspecified: Secondary | ICD-10-CM | POA: Diagnosis not present

## 2024-07-10 DIAGNOSIS — Z87891 Personal history of nicotine dependence: Secondary | ICD-10-CM | POA: Diagnosis not present

## 2024-07-10 DIAGNOSIS — N183 Chronic kidney disease, stage 3 unspecified: Secondary | ICD-10-CM | POA: Diagnosis not present

## 2024-07-10 DIAGNOSIS — E1141 Type 2 diabetes mellitus with diabetic mononeuropathy: Secondary | ICD-10-CM | POA: Diagnosis not present

## 2024-07-10 DIAGNOSIS — J440 Chronic obstructive pulmonary disease with acute lower respiratory infection: Secondary | ICD-10-CM | POA: Diagnosis not present

## 2024-07-10 DIAGNOSIS — N179 Acute kidney failure, unspecified: Secondary | ICD-10-CM | POA: Diagnosis not present

## 2024-07-10 DIAGNOSIS — Z9981 Dependence on supplemental oxygen: Secondary | ICD-10-CM | POA: Diagnosis not present

## 2024-07-10 DIAGNOSIS — R0602 Shortness of breath: Secondary | ICD-10-CM | POA: Diagnosis not present

## 2024-07-10 DIAGNOSIS — C7951 Secondary malignant neoplasm of bone: Secondary | ICD-10-CM | POA: Diagnosis not present

## 2024-07-10 DIAGNOSIS — C3412 Malignant neoplasm of upper lobe, left bronchus or lung: Secondary | ICD-10-CM | POA: Diagnosis not present

## 2024-07-10 DIAGNOSIS — I501 Left ventricular failure: Secondary | ICD-10-CM | POA: Diagnosis not present

## 2024-07-10 DIAGNOSIS — E871 Hypo-osmolality and hyponatremia: Secondary | ICD-10-CM | POA: Diagnosis not present

## 2024-07-10 DIAGNOSIS — J9621 Acute and chronic respiratory failure with hypoxia: Secondary | ICD-10-CM | POA: Diagnosis not present

## 2024-07-10 DIAGNOSIS — J849 Interstitial pulmonary disease, unspecified: Secondary | ICD-10-CM | POA: Diagnosis not present

## 2024-07-10 DIAGNOSIS — F32A Depression, unspecified: Secondary | ICD-10-CM | POA: Diagnosis not present

## 2024-07-10 DIAGNOSIS — J9811 Atelectasis: Secondary | ICD-10-CM | POA: Diagnosis not present

## 2024-07-10 DIAGNOSIS — Z515 Encounter for palliative care: Secondary | ICD-10-CM | POA: Diagnosis not present

## 2024-07-10 DIAGNOSIS — K861 Other chronic pancreatitis: Secondary | ICD-10-CM | POA: Diagnosis not present

## 2024-07-10 DIAGNOSIS — E1122 Type 2 diabetes mellitus with diabetic chronic kidney disease: Secondary | ICD-10-CM | POA: Diagnosis not present

## 2024-07-10 DIAGNOSIS — F419 Anxiety disorder, unspecified: Secondary | ICD-10-CM | POA: Diagnosis not present

## 2024-07-10 DIAGNOSIS — Z7982 Long term (current) use of aspirin: Secondary | ICD-10-CM | POA: Diagnosis not present

## 2024-07-10 DIAGNOSIS — I5033 Acute on chronic diastolic (congestive) heart failure: Secondary | ICD-10-CM | POA: Diagnosis not present

## 2024-07-10 DIAGNOSIS — Z923 Personal history of irradiation: Secondary | ICD-10-CM | POA: Diagnosis not present

## 2024-07-10 LAB — CBC
HCT: 33 % — ABNORMAL LOW (ref 39.0–52.0)
Hemoglobin: 10.4 g/dL — ABNORMAL LOW (ref 13.0–17.0)
MCH: 32.6 pg (ref 26.0–34.0)
MCHC: 31.5 g/dL (ref 30.0–36.0)
MCV: 103.4 fL — ABNORMAL HIGH (ref 80.0–100.0)
Platelets: 227 K/uL (ref 150–400)
RBC: 3.19 MIL/uL — ABNORMAL LOW (ref 4.22–5.81)
RDW: 13.6 % (ref 11.5–15.5)
WBC: 5.5 K/uL (ref 4.0–10.5)
nRBC: 0 % (ref 0.0–0.2)

## 2024-07-10 LAB — BASIC METABOLIC PANEL WITH GFR
Anion gap: 12 (ref 5–15)
BUN: 18 mg/dL (ref 6–20)
CO2: 17 mmol/L — ABNORMAL LOW (ref 22–32)
Calcium: 8.1 mg/dL — ABNORMAL LOW (ref 8.9–10.3)
Chloride: 97 mmol/L — ABNORMAL LOW (ref 98–111)
Creatinine, Ser: 1.92 mg/dL — ABNORMAL HIGH (ref 0.61–1.24)
GFR, Estimated: 40 mL/min — ABNORMAL LOW (ref >60)
Glucose, Bld: 453 mg/dL — ABNORMAL HIGH (ref 70–99)
Potassium: 4.6 mmol/L (ref 3.5–5.1)
Sodium: 126 mmol/L — ABNORMAL LOW (ref 135–145)

## 2024-07-10 LAB — GLUCOSE, CAPILLARY
Glucose-Capillary: 219 mg/dL — ABNORMAL HIGH (ref 70–99)
Glucose-Capillary: 203 mg/dL — ABNORMAL HIGH (ref 70–99)
Glucose-Capillary: 213 mg/dL — ABNORMAL HIGH (ref 70–99)
Glucose-Capillary: 189 mg/dL — ABNORMAL HIGH (ref 70–99)

## 2024-07-10 LAB — SODIUM, URINE, RANDOM: Sodium, Ur: 70 mmol/L

## 2024-07-10 MED ORDER — FUROSEMIDE 10 MG/ML IJ SOLN
40.0000 mg | Freq: Two times a day (BID) | INTRAMUSCULAR | Status: DC
  Administered 2024-07-10 – 2024-07-13 (×6): 40 mg via INTRAVENOUS
  Filled 2024-07-10 (×6): qty 4

## 2024-07-10 MED ORDER — IPRATROPIUM-ALBUTEROL 0.5-2.5 (3) MG/3ML IN SOLN
3.0000 mL | Freq: Three times a day (TID) | RESPIRATORY_TRACT | Status: DC
  Administered 2024-07-10 – 2024-07-11 (×3): 3 mL via RESPIRATORY_TRACT
  Filled 2024-07-10 (×4): qty 3

## 2024-07-10 MED ORDER — GUAIFENESIN ER 600 MG PO TB12
1200.0000 mg | ORAL_TABLET | Freq: Two times a day (BID) | ORAL | Status: DC
  Administered 2024-07-10 – 2024-07-14 (×8): 1200 mg via ORAL
  Filled 2024-07-10 (×8): qty 2

## 2024-07-10 NOTE — Progress Notes (Signed)
 Heart Failure Navigator Progress Note  Advanced Heart Failure Team patient.  Hospital follow-up scheduled for 07/24/2024 @ 10:00 AM.   Appointment card given to patient and also added to his AVS for discharge.  Navigator will sign off at this time.  Charmaine Pines, RN, BSN [facility] Heart Failure Navigator Secure Chat Only

## 2024-07-10 NOTE — Plan of Care (Signed)
  Problem: Coping: Goal: Ability to adjust to condition or change in health will improve Outcome: Progressing   Problem: Health Behavior/Discharge Planning: Goal: Ability to identify and utilize available resources and services will improve Outcome: Progressing   Problem: Skin Integrity: Goal: Risk for impaired skin integrity will decrease Outcome: Progressing   Problem: Education: Goal: Knowledge of General Education information will improve Description: Including pain rating scale, medication(s)/side effects and non-pharmacologic comfort measures Outcome: Progressing   Problem: Safety: Goal: Ability to remain free from injury will improve Outcome: Progressing

## 2024-07-10 NOTE — Progress Notes (Addendum)
 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  Increasingly tachycardic, tachypneic respiratory viral panel was negative.  BNP was elevated at 1000.  Lab work showed creatinine of 1.7.  Chest x-ray showed increasing bibasilar predominant interstitial prominence: Pulm edema versus atypical infection on the background of interstitial lung disease.  Started on steroids, antibiotics,iv lasix .  Assessment & Plan:  Principal Problem:   Acute on chronic respiratory failure with hypoxia (HCC) Active Problems:   CAP (community acquired pneumonia)   ILD (interstitial lung disease) (HCC)   Acute on chronic heart failure with preserved ejection fraction (HFpEF) (HCC)   AKI (acute kidney injury)   Essential hypertension   Type 2 diabetes mellitus with diabetic mononeuropathy, without long-term current use of insulin  (HCC)   Anxiety and depression   Chronic pancreatitis (HCC)   Acute on chronic hypoxic respiratory failure: On 2 L of oxygen  at home.  History of interstitial lung disease.  Presented with shortness of breath, cough. Currently on 3 L of oxygen  per minute.  D-dimer was elevated.  CTA PE did not show any evidence of pulm embolism.  Community acquired pneumonia: No fever, chills.  No leukocytosis or elevated lactate level.  Chest x-ray could not rule out atypical infection.  Started on azithromycin , ceftriaxone .  Continue bronchodilators as needed.  History of ILD: Exacerbation  suspected.  Started on steroid.He follows with pulmonology as outpatient  Acute on chronic HFpEF: Chest x-ray showing features of pulmonary edema.  Elevated proBNP.  Continue spironolactone .  Last echo showed EF of 50 to 55%.Will get new echo. Will start him on lasix  40 mg BID  Hypertension: Continue home amlodipine , olmesartan , spironolactone .  ARB will be held due to AKI.  AKI : Baseline creatinine normal.  Presented with creatinine of 1.7 with worsening  1.9 today . He was given iv fluid yesterday.AKI cud be from  volume overload.  Continue to monitor  . We will check ultrasound of the kidneys.We will check urine sodium.  Hyponatremia: Could be from hypervolemic hyponatremia.  Lasix  started.  Check BMP tomorrow  Type 2 diabetes: Currently on sliding scale.  Monitor blood sugars  Anxiety/depression: Continue sertraline , trazodone   History of chronic pancreatitis: On Creon       DVT prophylaxis:enoxaparin  (LOVENOX ) injection 40 mg Start: 07/09/24 2200     Code Status: Full Code  Family Communication: None at bedside  Patient status:Inpatient  Patient is from :Home  Anticipated discharge un:Ynfz  Estimated DC date:2-3 days   Consultants: None  Procedures:None  Antimicrobials:  Anti-infectives (From admission, onward)    Start     Dose/Rate Route Frequency Ordered Stop   07/10/24 1800  cefTRIAXone  (ROCEPHIN ) 2 g in sodium chloride  0.9 % 100 mL IVPB        2 g 200 mL/hr over 30 Minutes Intravenous Every 24 hours 07/09/24 2017 07/15/24 1759   07/10/24 1800  azithromycin  (ZITHROMAX ) 500 mg in sodium chloride  0.9 % 250 mL IVPB        500 mg 250 mL/hr over 60 Minutes Intravenous  Every 24 hours 07/09/24 2017 07/15/24 1759   07/09/24 1815  cefTRIAXone  (ROCEPHIN ) 2 g in sodium chloride  0.9 % 100 mL IVPB        2 g 200 mL/hr over 30 Minutes Intravenous  Once 07/09/24 1805 07/09/24 1915   07/09/24 1815  azithromycin  (ZITHROMAX ) 500 mg in sodium chloride  0.9 % 250 mL IVPB         500 mg 250 mL/hr over 60 Minutes Intravenous  Once 07/09/24 1805 07/09/24 2019       Subjective: Patient seen and examined at the bedside today.  He was on 3 L of oxygen .  Complains of shortness of breath.  Was coughing.  No peripheral edema but has bilateral crackles.  He feels as if his lungs are filled with water .  Speaking in full sentences.  No fever, abdominal pain, nausea or vomiting  Objective: Vitals:   07/10/24 0353 07/10/24 0711 07/10/24 0805 07/10/24 1143  BP: 126/73  (!) 145/91 120/78  Pulse: 85  89 87  Resp: 20  18 18   Temp: 97.7 F (36.5 C)  98 F (36.7 C) 97.7 F (36.5 C)  TempSrc:      SpO2: 91% 92% 100% 91%  Weight: 73.9 kg     Height:        Intake/Output Summary (Last 24 hours) at 07/10/2024 1348 Last data filed at 07/10/2024 1019 Gross per 24 hour  Intake 120 ml  Output 1650 ml  Net -1530 ml   Filed Weights   07/09/24 1731 07/10/24 0353  Weight: 77.6 kg 73.9 kg    Examination:  General exam: Overall comfortable, not in distress HEENT: PERRL Respiratory system: bilateral scattered crackles  Cardiovascular system: S1 & S2 heard, RRR.  Gastrointestinal system: Abdomen is nondistended, soft and nontender. Central nervous system: Alert and oriented Extremities: No edema, no clubbing ,no cyanosis Skin: No rashes, no ulcers,no icterus     Data Reviewed: I have personally reviewed following labs and imaging studies  CBC: Recent Labs  Lab 07/09/24 1740 07/10/24 0327  WBC 8.8 5.5  NEUTROABS 6.6  --   HGB 11.4* 10.4*  HCT 35.1* 33.0*  MCV 103.2* 103.4*  PLT 243 227   Basic Metabolic Panel: Recent Labs  Lab 07/09/24 1740 07/10/24 0327  NA 133* 126*  K 4.2 4.6  CL 101 97*  CO2 18* 17*  GLUCOSE 82 453*  BUN 11 18  CREATININE 1.72* 1.92*  CALCIUM  9.0 8.1*  MG 2.0  --      Recent Results (from the past 240 hours)  Resp panel by RT-PCR (RSV, Flu A&B, Covid) Anterior Nasal Swab     Status: None   Collection Time: 07/09/24  5:40  PM   Specimen: Anterior Nasal Swab  Result Value Ref Range Status   SARS Coronavirus 2 by RT PCR NEGATIVE NEGATIVE Final    Comment: (NOTE) SARS-CoV-2 target nucleic acids are NOT DETECTED.  The SARS-CoV-2 RNA is generally detectable in upper respiratory specimens during the acute phase of infection. The lowest concentration of SARS-CoV-2 viral copies this assay can detect is 138 copies/mL. A negative result does not preclude SARS-Cov-2 infection and should not be used as the sole basis for treatment or other patient management decisions. A negative result may occur with  improper specimen collection/handling, submission of specimen other than nasopharyngeal swab, presence of viral mutation(s) within the areas targeted by this assay, and inadequate number of viral copies(<138 copies/mL). A negative result must be combined with clinical observations, patient history,  and epidemiological information. The expected result is Negative.  Fact Sheet for Patients:  bloggercourse.com  Fact Sheet for Healthcare Providers:  seriousbroker.it  This test is no t yet approved or cleared by the [place]  FDA and  has been authorized for detection and/or diagnosis of SARS-CoV-2 by FDA under an Emergency Use Authorization (EUA). This EUA will remain  in effect (meaning this test can be used) for the duration of the COVID-19 declaration under Section 564(b)(1) of the Act, 21 U.S.C.section 360bbb-3(b)(1), unless the authorization is terminated  or revoked sooner.       Influenza A by PCR NEGATIVE NEGATIVE Final   Influenza B by PCR NEGATIVE NEGATIVE Final    Comment: (NOTE) The Xpert Xpress SARS-CoV-2/FLU/RSV plus assay is intended as an aid in the diagnosis of influenza from Nasopharyngeal swab specimens and should not be used as a sole basis for treatment. Nasal washings and aspirates are unacceptable for Xpert Xpress  SARS-CoV-2/FLU/RSV testing.  Fact Sheet for Patients: bloggercourse.com  Fact Sheet for Healthcare Providers: seriousbroker.it  This test is not yet approved or cleared by the [place]  FDA and has been authorized for detection and/or diagnosis of SARS-CoV-2 by FDA under an Emergency Use Authorization (EUA). This EUA will remain in effect (meaning this test can be used) for the duration of the COVID-19 declaration under Section 564(b)(1) of the Act, 21 U.S.C. section 360bbb-3(b)(1), unless the authorization is terminated or revoked.     Resp Syncytial Virus by PCR NEGATIVE NEGATIVE Final    Comment: (NOTE) Fact Sheet for Patients: bloggercourse.com  Fact Sheet for Healthcare Providers: seriousbroker.it  This test is not yet approved or cleared by the [place]  FDA and has been authorized for detection and/or diagnosis of SARS-CoV-2 by FDA under an Emergency Use Authorization (EUA). This EUA will remain in effect (meaning this test can be used) for the duration of the COVID-19 declaration under Section 564(b)(1) of the Act, 21 U.S.C. section 360bbb-3(b)(1), unless the authorization is terminated or revoked.  Performed at [facility], [address] [place], [place] [address]   Blood culture (routine x 2)     Status: None (Preliminary result)   Collection Time: 07/09/24  5:48 PM   Specimen: BLOOD  Result Value Ref Range Status   Specimen Description BLOOD RIGHT ANTECUBITAL  Final   Special Requests   Final    BOTTLES DRAWN AEROBIC AND ANAEROBIC Blood Culture results may not be optimal due to an inadequate volume of blood received in culture bottles   Culture   Final    NO GROWTH < 12 HOURS Performed at [facility], [address] [place], [place] [address]    Report Status PENDING  Incomplete  Blood culture (routine x 2)     Status: None  (Preliminary result)   Collection Time: 07/09/24  5:48 PM   Specimen: BLOOD  Result Value Ref Range Status   Specimen Description BLOOD LEFT ANTECUBITAL  Final   Special Requests   Final    BOTTLES DRAWN AEROBIC AND ANAEROBIC Blood Culture results may not be optimal due to an excessive volume of blood received in culture bottles   Culture   Final    NO GROWTH < 12 HOURS Performed at [facility], [address] [place], [place] [address]    Report Status PENDING  Incomplete     Radiology Studies: CT Angio Chest Pulmonary Embolism (PE) W or WO Contrast Result Date: 07/09/2024 EXAM: CTA of the Chest with contrast  for PE 07/09/2024 10:53:27 PM TECHNIQUE: CTA of the chest was performed after the administration of intravenous contrast. 75 ml omnipaque  350 Multiplanar reformatted images are provided for review. MIP images are provided for review. Automated exposure control, iterative reconstruction, and/or weight based adjustment of the mA/kV was utilized to reduce the radiation dose to as low as reasonably achievable. COMPARISON: 01/16/2024, 01/25/2024 PET CT. CLINICAL HISTORY: Chest pain and positive D-dimer. History of left upper lobe lung carcinoma. FINDINGS: PULMONARY ARTERIES: The pulmonary artery shows no evidence of pulmonary embolism. Main pulmonary artery is normal in caliber. MEDIASTINUM: The heart is not significantly enlarged in size. Coronary calcifications are noted. The aorta shows atherosclerotic calcifications. No aneurysmal dilatation or dissection is seen. The esophagus, as visualized, is within normal limits. LYMPH NODES: No mediastinal, hilar or axillary lymphadenopathy. LUNGS AND PLEURA: The lungs demonstrate diffuse emphysematous changes similar to those seen on prior exams. The previously seen left upper lobe lung mass again shows significant regression similar to that seen on prior PET CT from 1 month previous. Diffuse parenchymal opacity is noted, however, in the left  upper lobe likely representing acute on chronic infiltrate. Similar findings are noted in the superior segment of the left lower lobe. Interstitial edema is seen. No new focal mass is noted. No sizable effusion is seen. No pneumothorax. UPPER ABDOMEN: Visualized upper abdomen shows diffuse calcifications throughout the pancreas consistent with chronic pancreatitis. SOFT TISSUES AND BONES: Right chest wall port is seen. Osseous structures are within normal limits. No acute soft tissue abnormality. IMPRESSION: 1. No evidence of pulmonary embolism. 2. Diffuse parenchymal opacity in the left upper lobe, likely representing acute on chronic infiltrate, with similar findings in the superior segment of the left lower lobe. No new focal mass. 3. Nearly complete resolution of the previously seen left upper lobe lung mass, stable from prior imaging. 4. Diffuse emphysematous changes in the lungs, similar to prior exams. Electronically signed by: Oneil Devonshire MD 07/09/2024 11:06 PM EST RP Workstation: [id number]   DG Chest Portable 1 View Result Date: 07/09/2024 CLINICAL DATA:  SOB, hx ILD, CHF, LUL cancer EXAM: PORTABLE CHEST 1 VIEW COMPARISON:  January 24, 2024 FINDINGS: The cardiomediastinal silhouette is unchanged in contour.RIGHT chest port with tip terminating over the superior cavoatrial junction. No pleural effusion. No pneumothorax. Interval increase in a basilar predominant interstitial prominence. Known LEFT upper lobe malignancy is not well visualized radiographically and is favored decreased in size. IMPRESSION: Interval increase in a basilar predominant interstitial prominence. Differential considerations include pulmonary edema versus atypical infection superimposed on a background of interstitial lung disease. Electronically Signed   By: Corean Salter M.D.   On: 07/09/2024 17:58    Scheduled Meds:  amLODipine   10 mg Oral Daily   And   irbesartan   300 mg Oral Daily   empagliflozin   10 mg Oral QAC  breakfast   enoxaparin  (LOVENOX ) injection  40 mg Subcutaneous Q24H   guaiFENesin   600 mg Oral BID   insulin  aspart  0-15 Units Subcutaneous TID WC   insulin  aspart  0-5 Units Subcutaneous QHS   ipratropium-albuterol   3 mL Nebulization TID   lipase/protease/amylase  12,000 Units Oral TID WC   methylPREDNISolone  (SOLU-MEDROL ) injection  40 mg Intravenous Q12H   Followed by   NOREEN ON 07/11/2024] predniSONE   40 mg Oral Q breakfast   pantoprazole   40 mg Oral Daily   pravastatin   20 mg Oral q1800   sertraline   100 mg Oral Daily   spironolactone   25 mg  Oral Daily   Continuous Infusions:  azithromycin      cefTRIAXone  (ROCEPHIN )  IV       LOS: 0 days   Ivonne Mustache, MD [facility] P11/24/2025, 1:48 PM

## 2024-07-11 ENCOUNTER — Inpatient Hospital Stay: Admit: 2024-07-11 | Discharge: 2024-07-11 | Disposition: A | Attending: Internal Medicine

## 2024-07-11 DIAGNOSIS — J9621 Acute and chronic respiratory failure with hypoxia: Secondary | ICD-10-CM | POA: Diagnosis not present

## 2024-07-11 DIAGNOSIS — R9431 Abnormal electrocardiogram [ECG] [EKG]: Secondary | ICD-10-CM | POA: Diagnosis not present

## 2024-07-11 LAB — BASIC METABOLIC PANEL WITH GFR
Anion gap: 11 (ref 5–15)
BUN: 24 mg/dL — ABNORMAL HIGH (ref 6–20)
CO2: 20 mmol/L — ABNORMAL LOW (ref 22–32)
Calcium: 8.7 mg/dL — ABNORMAL LOW (ref 8.9–10.3)
Chloride: 100 mmol/L (ref 98–111)
Creatinine, Ser: 1.74 mg/dL — ABNORMAL HIGH (ref 0.61–1.24)
GFR, Estimated: 45 mL/min — ABNORMAL LOW (ref >60)
Glucose, Bld: 163 mg/dL — ABNORMAL HIGH (ref 70–99)
Potassium: 4.4 mmol/L (ref 3.5–5.1)
Sodium: 131 mmol/L — ABNORMAL LOW (ref 135–145)

## 2024-07-11 LAB — ECHOCARDIOGRAM COMPLETE
AR max vel: 5.62 cm2
AV Area VTI: 8.19 cm2
AV Area mean vel: 4.87 cm2
AV Mean grad: 3 mmHg
AV Peak grad: 4.8 mmHg
Ao pk vel: 1.09 m/s
Area-P 1/2: 4.77 cm2
Height: 67 in
MV VTI: 7.58 cm2
S' Lateral: 3.5 cm
Weight: 2536.17 [oz_av]

## 2024-07-11 LAB — CBC
HCT: 30.8 % — ABNORMAL LOW (ref 39.0–52.0)
Hemoglobin: 9.7 g/dL — ABNORMAL LOW (ref 13.0–17.0)
MCH: 32.6 pg (ref 26.0–34.0)
MCHC: 31.5 g/dL (ref 30.0–36.0)
MCV: 103.4 fL — ABNORMAL HIGH (ref 80.0–100.0)
Platelets: 248 K/uL (ref 150–400)
RBC: 2.98 MIL/uL — ABNORMAL LOW (ref 4.22–5.81)
RDW: 13.5 % (ref 11.5–15.5)
WBC: 8.4 K/uL (ref 4.0–10.5)
nRBC: 0 % (ref 0.0–0.2)

## 2024-07-11 LAB — GLUCOSE, CAPILLARY
Glucose-Capillary: 131 mg/dL — ABNORMAL HIGH (ref 70–99)
Glucose-Capillary: 158 mg/dL — ABNORMAL HIGH (ref 70–99)
Glucose-Capillary: 175 mg/dL — ABNORMAL HIGH (ref 70–99)
Glucose-Capillary: 196 mg/dL — ABNORMAL HIGH (ref 70–99)

## 2024-07-11 MED ORDER — AMOXICILLIN-POT CLAVULANATE 875-125 MG PO TABS
1.0000 | ORAL_TABLET | Freq: Two times a day (BID) | ORAL | Status: AC
  Administered 2024-07-11 – 2024-07-14 (×6): 1 via ORAL
  Filled 2024-07-11 (×6): qty 1

## 2024-07-11 NOTE — Plan of Care (Signed)
  Problem: Clinical Measurements: Goal: Respiratory complications will improve Outcome: Progressing   Problem: Activity: Goal: Risk for activity intolerance will decrease Outcome: Progressing   Problem: Pain Managment: Goal: General experience of comfort will improve and/or be controlled Outcome: Progressing   Problem: Safety: Goal: Ability to remain free from injury will improve Outcome: Progressing

## 2024-07-11 NOTE — Hospital Course (Signed)
 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  Increasingly tachycardic, tachypneic respiratory viral panel was negative.  BNP was elevated at 1000.  Lab work showed creatinine of 1.7.  Chest x-ray showed increasing bibasilar predominant interstitial prominence: Pulm edema versus atypical infection on the background of interstitial lung disease.  Started on steroids, antibiotics,iv lasix .  11/26: Still requiring 4 L of oxygen  via nasal cannula, baseline is 2 L via nasal cannula

## 2024-07-11 NOTE — Progress Notes (Signed)
 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  Increasingly tachycardic, tachypneic respiratory viral panel was negative.  BNP was elevated at 1000.  Lab work showed creatinine of 1.7.  Chest x-ray showed increasing bibasilar predominant interstitial prominence: Pulm edema versus atypical infection on the background of interstitial lung disease.  Started on steroids, antibiotics,iv lasix .    Assessment and Plan:  Acute on chronic respiratory failure with hypoxia (HCC) Multifactorial: CAP, possibly CHF exacerbation ILD /stage IV lung cancer Patient presents with acute respiratory distress 1 week of productive cough, saturating at 75% on baseline O2 rate of 2 L Chest x-ray showing interstitial predominance pulmonary edema versus atypical infection Low suspicion for acute PE but given cancer history, will get D-dimer with further workup if indicated  Currently on 5 L-continue and wean as tolerated Treat each respective problem-please refer to problem list Addendum: D-dimer elevated ->CTA PE as follows: -No evidence of pulmonary embolism. -Diffuse parenchymal opacity in the left upper lobe, likely representing acute on chronic infiltrate, with similar findings in the superior segment of the left lower lobe. No new focal mass.  CAP (community acquired pneumonia) SIRS, sepsis not suspected Interstitial lung disease on 2 L at baseline Patient  with productive cough, no fever no chills tachycardic and tachypneic but normal WBC and lactic acid Chest x-ray showing possible atypical infection Rocephin  and azithromycin  Scheduled and as needed DuoNebs Will hold off on steroids in the setting of acute infection And atelectasis Incentive spirometer and flutter valve  Acute on chronic heart failure with preserved ejection fraction (HFpEF) (HCC) Appears clinically euvolemic though chest x-ray showing possible pulmonary edema with proBNP over thousand CTA without evidence of pulmonary vascular congestion Continue spironolactone  Follows at the heart failure clinic, last EF 50 to 55% in May  Essential hypertension Continue home amlodipine /olmesartan  and spironolactone   AKI (acute kidney injury) Creatinine 1.72 up from baseline of 0.91 Suspecting dehydration from poor oral intake in combination with spironolactone  for CHF Monitor renal function and avoid nephrotoxins  Type 2 diabetes mellitus with diabetic mononeuropathy, without long-term current use of insulin  (HCC) Sliding scale insulin  coverage  Anxiety and depression Continue sertraline  and trazodone   Chronic pancreatitis (HCC) Continue Pancreaze  and omeprazole        Subjective: Still complains of shortness of breath and cough.  Denies any fever or chills.  Physical Exam: Vitals:   07/11/24 0817 07/11/24 0854 07/11/24 1300 07/11/24 1627  BP:  115/77 (!) 100/54 119/78  Pulse:  76 86 78  Resp:    16  Temp:  98 F (36.7 C) 98.2 F (36.8 C) 98.1 F (36.7 C)  TempSrc:  Oral Oral   SpO2: (!) 89% 96% 94% 93%  Weight:      Height:       General exam: Overall comfortable, not in distress HEENT: PERRL Respiratory system: bilateral scattered wheezes and crackles  Cardiovascular system: S1 & S2 heard, RRR.  Gastrointestinal system: Abdomen is nondistended, soft and nontender. Central  nervous system: Alert and oriented Extremities: No edema, no clubbing ,no  cyanosis Skin: No rashes, no ulcers,no icterus     Data Reviewed:  Reviewed  Family Communication: None available  Disposition: Status is: Inpatient Remains inpatient appropriate because: Ongoing recovery from acute hypoxemic respiratory failure  Planned Discharge Destination: Home    Time spent: 35 minutes  Author: Nena Rebel, MD 07/11/2024 4:39 PM  For on call review [email or web address].

## 2024-07-11 NOTE — TOC Initial Note (Addendum)
 Transition of Care [facility]) - Initial/Assessment Note    Patient Details  Name: Noah Velasquez MRN: [id number] Date of Birth: [birth year]  Transition [facility]) CM/SW Contact:    Shasta DELENA Daring, RN Phone Number: 07/11/2024, 11:02 AM  Clinical Narrative:                  RNCM assessed patient. He was alone at the time. Alert and oriented x4. Patient was guarded about sharing information, but RNCM was able to complete interview. Has home O2 with [employer]. Lives alone in an apartment. Calls mobility [employer] for rides to appointments (through his insurance). Patient uses cane at home and has power chair for longer trips (to dumpster, etc). No Home health services established. Patient says he would like to have an aide.  Uses either [facility] or [facility] pharmacy and denies difficulty affording medications.  No additional TOC needs identified. Will follow for discharge readiness.  1444: Confirmed with Mitch at [employer] that patient will not need additional testing for change in O2 requirement, but will need a Liter Change order to update equipment to a 10 L concentrator (will be required if patient remains on 4L O2)  Expected Discharge Plan: Home/Self Care Barriers to Discharge: Continued Medical Work up   Patient Goals and CMS Choice Patient states their goals for this hospitalization and ongoing recovery are:: Improve breathing and go home.          Expected Discharge Plan and Services   Discharge Planning Services: CM Consult                       DME Agency: [facility]                  Prior Living Arrangements/Services   Lives with:: Self Patient language and need for interpreter reviewed:: Yes Do you feel safe going back to the place where you live?: Yes      Need for Family Participation in Patient Care: Yes (Comment) Care giver support system in place?: Yes (comment)   Criminal Activity/Legal Involvement Pertinent to Current Situation/Hospitalization: No - Comment as  needed  Activities of Daily Living   ADL Screening (condition at time of admission) Independently performs ADLs?: Yes (appropriate for developmental age) Is the patient deaf or have difficulty hearing?: No Does the patient have difficulty seeing, even when wearing glasses/contacts?: No Does the patient have difficulty concentrating, remembering, or making decisions?: No  Permission Sought/Granted                  Emotional Assessment Appearance:: Appears stated age Attitude/Demeanor/Rapport: Guarded, Gracious Affect (typically observed): Accepting, Stable Orientation: : Oriented to Self, Oriented to Place, Oriented to  Time, Oriented to Situation Alcohol  / Substance Use: Not Applicable    Admission diagnosis:  ILD (interstitial lung disease) (HCC) [J84.9] Atypical pneumonia [J18.9] CAP (community acquired pneumonia) [J18.9] Acute respiratory failure with hypoxia (HCC) [J96.01] Acute on chronic respiratory failure with hypoxia (HCC) [J96.21] Patient Active Problem List   Diagnosis Date Noted   CAP (community acquired pneumonia) 07/09/2024   Chemotherapy induced neutropenia 03/21/2024   Small cell lung cancer, left upper lobe (HCC) 02/21/2024   Acute on chronic heart failure with preserved ejection fraction (HFpEF) (HCC) 01/15/2024   CHF (congestive heart failure) (HCC) 01/15/2024   Type 2 diabetes mellitus with diabetic mononeuropathy, without long-term current use of insulin  (HCC) 09/07/2023   Overweight with body mass index (BMI) of 26 to 26.9 in adult 04/07/2023  ILD (interstitial lung disease) (HCC) 06/01/2022   Aortic atherosclerosis 12/10/2021   Hyperlipidemia associated with type 2 diabetes mellitus (HCC) 01/26/2021   GERD (gastroesophageal reflux disease) 01/26/2021   Chronic pancreatitis (HCC) 01/26/2021   Erectile dysfunction 01/10/2020   Anxiety and depression 06/05/2019   Alcohol  use disorder, moderate, in sustained remission (HCC) 04/06/2019   Acute on  chronic respiratory failure with hypoxia (HCC) 05/06/2018   AKI (acute kidney injury) 04/03/2017   Anemia 03/09/2016   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 [place] [address] Phone: [phone] Fax: [phone]  [facility] [address] [place] [address] Phone: [phone] Fax: [phone]  [facility] [place], [place] - [address] [place] [address] Phone: [phone] Fax: [phone]     Social Drivers of Health (SDOH) Social History: SDOH Screenings   Food Insecurity: No Food Insecurity (01/31/2024)  Recent Concern: Food Insecurity - Food Insecurity Present (01/15/2024)  Housing: Low Risk  (01/31/2024)  Transportation Needs: No Transportation Needs (01/31/2024)  Utilities: Not At Risk (01/31/2024)  Alcohol  Screen: Low Risk  (04/07/2023)  Depression (PHQ2-9): Low Risk  (06/12/2024)  Tobacco Use: Medium Risk (07/09/2024)   SDOH Interventions:     Readmission Risk Interventions     No data to display

## 2024-07-11 NOTE — Progress Notes (Signed)
*  PRELIMINARY RESULTS* Echocardiogram 2D Echocardiogram has been performed.  Noah Velasquez 07/11/2024, 2:34 PM

## 2024-07-11 NOTE — Plan of Care (Signed)
  Problem: Fluid Volume: Goal: Ability to maintain a balanced intake and output will improve Outcome: Progressing   Problem: Nutritional: Goal: Maintenance of adequate nutrition will improve Outcome: Progressing   Problem: Skin Integrity: Goal: Risk for impaired skin integrity will decrease Outcome: Progressing   Problem: Clinical Measurements: Goal: Cardiovascular complication will be avoided Outcome: Progressing   Problem: Nutrition: Goal: Adequate nutrition will be maintained Outcome: Progressing   Problem: Elimination: Goal: Will not experience complications related to urinary retention Outcome: Progressing

## 2024-07-12 ENCOUNTER — Inpatient Hospital Stay: Attending: Radiation Oncology

## 2024-07-12 ENCOUNTER — Inpatient Hospital Stay

## 2024-07-12 DIAGNOSIS — J449 Chronic obstructive pulmonary disease, unspecified: Secondary | ICD-10-CM | POA: Diagnosis not present

## 2024-07-12 DIAGNOSIS — J9621 Acute and chronic respiratory failure with hypoxia: Secondary | ICD-10-CM | POA: Diagnosis not present

## 2024-07-12 DIAGNOSIS — R0602 Shortness of breath: Secondary | ICD-10-CM | POA: Diagnosis not present

## 2024-07-12 LAB — GLUCOSE, CAPILLARY
Glucose-Capillary: 120 mg/dL — ABNORMAL HIGH (ref 70–99)
Glucose-Capillary: 151 mg/dL — ABNORMAL HIGH (ref 70–99)
Glucose-Capillary: 155 mg/dL — ABNORMAL HIGH (ref 70–99)
Glucose-Capillary: 191 mg/dL — ABNORMAL HIGH (ref 70–99)

## 2024-07-12 MED ORDER — ALUM & MAG HYDROXIDE-SIMETH 200-200-20 MG/5ML PO SUSP
30.0000 mL | ORAL | Status: DC | PRN
  Administered 2024-07-12: 30 mL via ORAL
  Filled 2024-07-12: qty 30

## 2024-07-12 NOTE — Progress Notes (Signed)
 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  Increasingly tachycardic, tachypneic respiratory viral panel was negative.  BNP was elevated at 1000.  Lab work showed creatinine of 1.7.  Chest x-ray showed increasing bibasilar predominant interstitial prominence: Pulm edema versus atypical infection on the background of interstitial lung disease.  Started on steroids, antibiotics,iv lasix .  11/26: Still requiring 4 L of oxygen  via nasal cannula, baseline is 2 L via nasal cannula  Assessment and Plan:  Acute on chronic respiratory failure with hypoxia (HCC) Multifactorial: CAP, possibly CHF exacerbation ILD /stage IV lung cancer Patient presents with acute respiratory distress 1 week of productive cough, saturating at 75% on baseline O2 rate of 2 L Chest x-ray showing interstitial predominance pulmonary edema versus atypical infection Low suspicion for acute PE but given cancer history, will get D-dimer with further workup if indicated  Currently on 5 L-continue and wean as tolerated Treat each respective problem-please refer to problem list 11/23: D-dimer elevated ->CTA PE as follows: -No evidence of pulmonary embolism. -Diffuse parenchymal opacity in the left upper lobe, likely representing acute on chronic infiltrate, with similar findings in the superior segment of the left lower lobe. No new focal mass.   CAP (community acquired  pneumonia) SIRS, sepsis not suspected Interstitial lung disease on 2 L at baseline Patient with productive cough, no fever no chills tachycardic and tachypneic but normal WBC and lactic acid Chest x-ray showing possible atypical infection Rocephin  and azithromycin  Scheduled and as needed DuoNebs Will hold off on steroids in the setting of acute infection Incentive spirometer and flutter valve   Acute on chronic heart failure with preserved ejection fraction (HFpEF) (HCC) Appears clinically euvolemic though chest x-ray showing possible pulmonary edema with proBNP over thousand CTA without evidence of pulmonary vascular congestion Continue spironolactone  Follows at the heart failure clinic, last EF 50 to 55% in May   Essential hypertension Continue home amlodipine /olmesartan  and spironolactone    AKI (acute kidney injury) Creatinine 1.74 11/26 up from baseline of 0.91 This could be new baseline as he appears to be euvolemic Suspecting dehydration from poor oral intake in combination with spironolactone  for CHF Monitor renal function and avoid nephrotoxins   Type 2 diabetes mellitus with diabetic mononeuropathy, without long-term current use of insulin  (HCC) Sliding scale insulin  coverage   Anxiety and depression Continue sertraline  and trazodone    Chronic pancreatitis (HCC) Continue Pancreaze  and omeprazole          Subjective: Feels better but he says he has shortness of breath on exertion  Physical Exam: Vitals:   07/12/24 0820 07/12/24 1206 07/12/24 1220 07/12/24 1240  BP: 112/74  109/74   Pulse: 82  92   Resp: 20  20   Temp: 98.4 F (36.9 C)  97.7 F (36.5 C)   TempSrc: Oral  Oral   SpO2: 91% 93% 94% 94%  Weight:      Height:       General exam: Overall comfortable, not in  distress HEENT: PERRL Respiratory system: bilateral scattered wheezes and crackles  Cardiovascular system: S1 & S2 heard, RRR.  Gastrointestinal system: Abdomen is nondistended, soft and  nontender. Central nervous system: Alert and oriented Extremities: No edema, no clubbing ,no cyanosis Skin: No rashes, no ulcers,no icterus    Data Reviewed:  Reviewed creatinine is 1.74  Family Communication: None  Disposition: Status is: Inpatient Remains inpatient appropriate because: Ongoing recovery from acute hypoxemic respiratory failure requiring oxygen  more than usual to 4 L  Planned Discharge Destination: Home    Time spent: 35 minutes  Author: Nena Rebel, MD 07/12/2024 3:41 PM  For on call review [email or web address].

## 2024-07-12 NOTE — Progress Notes (Signed)
 SaO2 on 4 liters air at rest = 88% SaO2 on 6 liters while ambulating = 85%, sats increased to 93% on 6 liters at rest with pursed lip breathing.   Cassidey Barrales, PT, GCS 07/12/24,12:16 PM

## 2024-07-12 NOTE — TOC Progression Note (Addendum)
 Transition of Care [facility]) - Progression Note    Patient Details  Name: Noah Velasquez MRN: [id number] Date of Birth: June 26, 1966  Transition of Care [facility]) CM/SW Contact  Shasta DELENA Daring, RN Phone Number: 07/12/2024, 4:42 PM  Clinical Narrative:    RNCM discussed HH and DME with patient. He is amenable to getting services. [place] he also needs help with taking care of his house. Advised that would be a personal aide and that he would need to make those arrangements privately.  Patient verbalized understanding. HH search initiated.  Patient agreeable to walker, ordered through [employer].  Expected Discharge Plan: Home/Self Care Barriers to Discharge: Continued Medical Work up               Expected Discharge Plan and Services   Discharge Planning Services: CM Consult                       DME Agency: [facility]                   Social Drivers of Health (SDOH) Interventions SDOH Screenings   Food Insecurity: No Food Insecurity (01/31/2024)  Recent Concern: Food Insecurity - Food Insecurity Present (01/15/2024)  Housing: Low Risk  (01/31/2024)  Transportation Needs: No Transportation Needs (01/31/2024)  Utilities: Not At Risk (01/31/2024)  Alcohol  Screen: Low Risk  (04/07/2023)  Depression (PHQ2-9): Low Risk  (06/12/2024)  Tobacco Use: Medium Risk (07/09/2024)    Readmission Risk Interventions     No data to display

## 2024-07-12 NOTE — Plan of Care (Signed)
  Problem: Coping: Goal: Ability to adjust to condition or change in health will improve Outcome: Progressing   Problem: Fluid Volume: Goal: Ability to maintain a balanced intake and output will improve Outcome: Progressing   Problem: Metabolic: Goal: Ability to maintain appropriate glucose levels will improve Outcome: Progressing   Problem: Clinical Measurements: Goal: Respiratory complications will improve Outcome: Progressing   Problem: Safety: Goal: Ability to remain free from injury will improve Outcome: Progressing

## 2024-07-12 NOTE — Evaluation (Signed)
 Physical Therapy Evaluation Patient Details Name: Noah Velasquez MRN: [id number] DOB: 22-Apr-1966 Today's Date: 07/12/2024  History of Present Illness  Noah Velasquez is a 58 y.o. 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   Clinical Impression  Patient received in bed sleeping. Wakes easily to voice. Patient is pleasant and agreeable to PT assessment. He is received on 4 liters O2 and sats in upper 80%s at rest. Bumped O2 up to 6 liters and increased to 93% at rest. With ambulation he dropped to 85% on 6 liters. He is mod I with bed mobility and sit to stand transfers. Patient ambulated ~ 45 feet with SPC and supervision. Some mild unsteadiness with ambulation. He will continue to benefit from skilled PT to improve endurance, safety and independence.         If plan is discharge home, recommend the following: A little help with walking and/or transfers;A little help with bathing/dressing/bathroom   Can travel by private vehicle    yes    Equipment Recommendations Rolling walker (2 wheels);Other (comment) (pulse oximeter)  Recommendations for Other Services       Functional Status Assessment Patient has had a recent decline in their functional status and demonstrates the ability to make significant improvements in function in a reasonable and predictable amount of time.     Precautions / Restrictions Precautions Precautions: Fall Recall of Precautions/Restrictions: Intact Restrictions Weight Bearing Restrictions Per Provider Order: No      Mobility  Bed Mobility Overal bed mobility: Modified Independent                  Transfers Overall transfer  level: Modified independent Equipment used: Straight cane                    Ambulation/Gait Ambulation/Gait assistance: Supervision Gait Distance (Feet): 45 Feet Assistive device: Straight cane Gait Pattern/deviations: Step-to pattern, Decreased step length - right, Decreased step length - left, Antalgic Gait velocity: dec     General Gait Details: R hip pain with ambulation. Ambulated on 6 liters O2. Sats dropped to 85% with ambulation. Increased back to 93% with seated rest on 6 liters. Mild unsteadiness with mobility  Stairs            Wheelchair Mobility     Tilt Bed    Modified Rankin (Stroke Patients Only)       Balance Overall balance assessment: Needs assistance, Mild deficits observed, not formally tested Sitting-balance support: Feet supported Sitting balance-Leahy Scale: Normal     Standing balance support: Single extremity supported, During functional activity, Reliant on assistive device for balance Standing balance-Leahy Scale: Fair                               Pertinent Vitals/Pain Pain Assessment Pain Assessment: Faces Faces Pain Scale: Hurts little more Pain Location: R hip ( chronic) Pain Descriptors / Indicators: Discomfort Pain Intervention(s): Monitored during session, Repositioned    Home Living Family/patient expects to be discharged to:: Private residence Living Arrangements: Alone Available Help at Discharge: Family;Available PRN/intermittently Type of Home: Apartment Home Access: Level entry     Alternate Level  Stairs-Number of Steps: flight Home Layout: Two level;Bed/bath upstairs Home Equipment: Cane - single point;Wheelchair - power;Shower seat      Prior Function Prior Level of Function : Independent/Modified Independent             Mobility Comments: ambulatory with SPC in home, power chair for out of home. friends assists with transportation ADLs Comments: Arzella comes by to check on him  daily.     Extremity/Trunk Assessment   Upper Extremity Assessment Upper Extremity Assessment: Overall WFL for tasks assessed    Lower Extremity Assessment Lower Extremity Assessment: RLE deficits/detail;Generalized weakness RLE Deficits / Details: painful, reports he needs a hip replacement    Cervical / Trunk Assessment Cervical / Trunk Assessment: Normal  Communication   Communication Communication: No apparent difficulties    Cognition Arousal: Alert Behavior During Therapy: WFL for tasks assessed/performed   PT - Cognitive impairments: No apparent impairments                         Following commands: Intact       Cueing Cueing Techniques: Verbal cues     General Comments      Exercises     Assessment/Plan    PT Assessment Patient needs continued PT services  PT Problem List Decreased strength;Decreased activity tolerance;Decreased balance;Decreased mobility;Cardiopulmonary status limiting activity       PT Treatment Interventions DME instruction;Gait training;Stair training;Functional mobility training;Therapeutic activities;Therapeutic exercise;Balance training;Patient/family education    PT Goals (Current goals can be found in the Care Plan section)  Acute Rehab PT Goals Patient Stated Goal: improve PT Goal Formulation: With patient Time For Goal Achievement: 07/27/24 Potential to Achieve Goals: Good    Frequency Min 2X/week     Co-evaluation               AM-PAC PT 6 Clicks Mobility  Outcome Measure Help needed turning from your back to your side while in a flat bed without using bedrails?: None Help needed moving from lying on your back to sitting on the side of a flat bed without using bedrails?: None Help needed moving to and from a bed to a chair (including a wheelchair)?: A Little Help needed standing up from a chair using your arms (e.g., wheelchair or bedside chair)?: A Little Help needed to walk in hospital room?:  A Little Help needed climbing 3-5 steps with a railing? : A Lot 6 Click Score: 19    End of Session Equipment Utilized During Treatment: Oxygen  Activity Tolerance: Patient limited by fatigue;Other (comment) (Low O2 sats) Patient left: in bed;with call bell/phone within reach Nurse Communication: Mobility status PT Visit Diagnosis: Other abnormalities of gait and mobility (R26.89);Muscle weakness (generalized) (M62.81);Unsteadiness on feet (R26.81);Difficulty in walking, not elsewhere classified (R26.2)    Time: 1135-1150 PT Time Calculation (min) (ACUTE ONLY): 15 min   Charges:   PT Evaluation $PT Eval Moderate Complexity: 1 Mod   PT General Charges $$ ACUTE PT VISIT: 1 Visit         Ceejay Kegley, PT, GCS 07/12/24,12:14 PM

## 2024-07-13 DIAGNOSIS — J9621 Acute and chronic respiratory failure with hypoxia: Secondary | ICD-10-CM | POA: Diagnosis not present

## 2024-07-13 DIAGNOSIS — I10 Essential (primary) hypertension: Secondary | ICD-10-CM | POA: Diagnosis not present

## 2024-07-13 DIAGNOSIS — I5033 Acute on chronic diastolic (congestive) heart failure: Secondary | ICD-10-CM

## 2024-07-13 DIAGNOSIS — N179 Acute kidney failure, unspecified: Secondary | ICD-10-CM | POA: Diagnosis not present

## 2024-07-13 DIAGNOSIS — F419 Anxiety disorder, unspecified: Secondary | ICD-10-CM

## 2024-07-13 DIAGNOSIS — E1141 Type 2 diabetes mellitus with diabetic mononeuropathy: Secondary | ICD-10-CM | POA: Diagnosis not present

## 2024-07-13 DIAGNOSIS — K861 Other chronic pancreatitis: Secondary | ICD-10-CM | POA: Diagnosis not present

## 2024-07-13 DIAGNOSIS — J849 Interstitial pulmonary disease, unspecified: Secondary | ICD-10-CM

## 2024-07-13 DIAGNOSIS — F32A Depression, unspecified: Secondary | ICD-10-CM | POA: Diagnosis not present

## 2024-07-13 LAB — GLUCOSE, CAPILLARY
Glucose-Capillary: 92 mg/dL (ref 70–99)
Glucose-Capillary: 149 mg/dL — ABNORMAL HIGH (ref 70–99)
Glucose-Capillary: 195 mg/dL — ABNORMAL HIGH (ref 70–99)
Glucose-Capillary: 198 mg/dL — ABNORMAL HIGH (ref 70–99)

## 2024-07-13 LAB — BASIC METABOLIC PANEL WITH GFR
Anion gap: 7 (ref 5–15)
BUN: 43 mg/dL — ABNORMAL HIGH (ref 6–20)
CO2: 25 mmol/L (ref 22–32)
Calcium: 8.8 mg/dL — ABNORMAL LOW (ref 8.9–10.3)
Chloride: 96 mmol/L — ABNORMAL LOW (ref 98–111)
Creatinine, Ser: 1.72 mg/dL — ABNORMAL HIGH (ref 0.61–1.24)
GFR, Estimated: 45 mL/min — ABNORMAL LOW (ref >60)
Glucose, Bld: 115 mg/dL — ABNORMAL HIGH (ref 70–99)
Potassium: 4.7 mmol/L (ref 3.5–5.1)
Sodium: 128 mmol/L — ABNORMAL LOW (ref 135–145)

## 2024-07-13 NOTE — Plan of Care (Signed)
  Problem: Education: Goal: Ability to describe self-care measures that may prevent or decrease complications (Diabetes Survival Skills Education) will improve Outcome: Progressing Goal: Individualized Educational Video(s) Outcome: Progressing   Problem: Coping: Goal: Ability to adjust to condition or change in health will improve Outcome: Progressing   Problem: Fluid Volume: Goal: Ability to maintain a balanced intake and output will improve Outcome: Progressing   Problem: Health Behavior/Discharge Planning: Goal: Ability to identify and utilize available resources and services will improve Outcome: Progressing Goal: Ability to manage health-related needs will improve Outcome: Progressing   Problem: Metabolic: Goal: Ability to maintain appropriate glucose levels will improve Outcome: Progressing   Problem: Nutritional: Goal: Maintenance of adequate nutrition will improve Outcome: Progressing Goal: Progress toward achieving an optimal weight will improve Outcome: Progressing   Problem: Skin Integrity: Goal: Risk for impaired skin integrity will decrease Outcome: Progressing   Problem: Tissue Perfusion: Goal: Adequacy of tissue perfusion will improve Outcome: Progressing   Problem: Education: Goal: Knowledge of General Education information will improve Description: Including pain rating scale, medication(s)/side effects and non-pharmacologic comfort measures Outcome: Progressing   Problem: Health Behavior/Discharge Planning: Goal: Ability to manage health-related needs will improve Outcome: Progressing   Problem: Clinical Measurements: Goal: Ability to maintain clinical measurements within normal limits will improve Outcome: Progressing Goal: Will remain free from infection Outcome: Progressing Goal: Diagnostic test results will improve Outcome: Progressing Goal: Respiratory complications will improve Outcome: Progressing Goal: Cardiovascular complication will  be avoided Outcome: Progressing   Problem: Activity: Goal: Risk for activity intolerance will decrease Outcome: Progressing   Problem: Nutrition: Goal: Adequate nutrition will be maintained Outcome: Progressing   Problem: Coping: Goal: Level of anxiety will decrease Outcome: Progressing   Problem: Elimination: Goal: Will not experience complications related to bowel motility Outcome: Progressing Goal: Will not experience complications related to urinary retention Outcome: Progressing   Problem: Pain Managment: Goal: General experience of comfort will improve and/or be controlled Outcome: Progressing   Problem: Safety: Goal: Ability to remain free from injury will improve Outcome: Progressing   Problem: Skin Integrity: Goal: Risk for impaired skin integrity will decrease Outcome: Progressing   Problem: Education: Goal: Ability to demonstrate management of disease process will improve Outcome: Progressing Goal: Ability to verbalize understanding of medication therapies will improve Outcome: Progressing Goal: Individualized Educational Video(s) Outcome: Progressing   Problem: Activity: Goal: Capacity to carry out activities will improve Outcome: Progressing   Problem: Cardiac: Goal: Ability to achieve and maintain adequate cardiopulmonary perfusion will improve Outcome: Progressing   Problem: Education: Goal: Knowledge of disease or condition will improve Outcome: Progressing Goal: Knowledge of the prescribed therapeutic regimen will improve Outcome: Progressing Goal: Individualized Educational Video(s) Outcome: Progressing   Problem: Activity: Goal: Ability to tolerate increased activity will improve Outcome: Progressing Goal: Will verbalize the importance of balancing activity with adequate rest periods Outcome: Progressing   Problem: Respiratory: Goal: Ability to maintain a clear airway will improve Outcome: Progressing Goal: Levels of oxygenation  will improve Outcome: Progressing Goal: Ability to maintain adequate ventilation will improve Outcome: Progressing   Problem: Activity: Goal: Ability to tolerate increased activity will improve Outcome: Progressing   Problem: Clinical Measurements: Goal: Ability to maintain a body temperature in the normal range will improve Outcome: Progressing   Problem: Respiratory: Goal: Ability to maintain adequate ventilation will improve Outcome: Progressing Goal: Ability to maintain a clear airway will improve Outcome: Progressing

## 2024-07-13 NOTE — TOC Progression Note (Signed)
 Transition of Care [facility]) - Progression Note    Patient Details  Name: Noah Velasquez MRN: [id number] Date of Birth: 1965/12/24  [facility] CM/SW Contact  K'La JINNY Ruts, LCSW Phone Number: 07/13/2024, 12:46 PM  Clinical Narrative:    Chart reviewed. Patient has no HH acceptances yet. 2 are pending. ICM will continue to monitor HUB.    Expected Discharge Plan: Home/Self Care Barriers to Discharge: Continued Medical Work up               Expected Discharge Plan and Services   Discharge Planning Services: CM Consult                       DME Agency: [employer]                   Social Drivers of Health (SDOH) Interventions SDOH Screenings   Food Insecurity: No Food Insecurity (01/31/2024)  Recent Concern: Food Insecurity - Food Insecurity Present (01/15/2024)  Housing: Low Risk  (01/31/2024)  Transportation Needs: No Transportation Needs (01/31/2024)  Utilities: Not At Risk (01/31/2024)  Alcohol  Screen: Low Risk  (04/07/2023)  Depression (PHQ2-9): Low Risk  (06/12/2024)  Tobacco Use: Medium Risk (07/09/2024)    Readmission Risk Interventions     No data to display

## 2024-07-13 NOTE — Progress Notes (Signed)
 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  Increasingly tachycardic, tachypneic respiratory viral panel was negative.  BNP was elevated at 1000.  Lab work showed creatinine of 1.7.  Chest x-ray showed increasing bibasilar predominant interstitial prominence: Pulm edema versus atypical infection on the background of interstitial lung disease.  Started on steroids, antibiotics,iv lasix .  11/26: Still requiring 4 L of oxygen  via nasal cannula, baseline is 2 L via nasal cannula echo with preserved EF, grade 1 diastolic dysfunction 11/27: Worsening hyponatremia, worsening AKI.  Still on 4 L [place].  Discontinuing Lasix   Subjective: This morning patient reports near syncopal episode when trying to go to the bathroom.  Morning amlodipine  dose was held   Objective: Vitals:   07/12/24 1646 07/12/24 2022 07/13/24 0005 07/13/24 0502  BP: 97/65 107/70 106/79 118/78  Pulse: 77 82 77 66  Resp:  20 18 18   Temp:  98.4 F (36.9 C) (!) 97.5 F (36.4 C) 97.8 F (36.6 C)  TempSrc:  Oral  Oral  SpO2:  93% 95% 94%  Weight:    74.2 kg  Height:        Intake/Output Summary (Last 24 hours) at 07/13/2024 0816 Last data filed at 07/13/2024 0506 Gross per 24 hour  Intake 360 ml  Output 2075 ml  Net -1715 ml   Filed Weights   07/11/24 0550 07/12/24 0340 07/13/24 0502  Weight: 71.9 kg 71.8 kg 74.2 kg    Examination: General  exam: Appears calm and comfortable, NAD  Respiratory system: No work of breathing, symmetric chest wall expansion Cardiovascular system: S1 & S2 heard, RRR.  Gastrointestinal system: Abdomen is nondistended, soft and nontender.  Neuro: Alert and oriented. No focal neurological deficits. Extremities: Symmetric, expected ROM Skin: No rashes, lesions Psychiatry: Demonstrates appropriate judgement and insight. Mood & affect appropriate for situation.   Assessment & Plan:  Principal Problem:   Acute on chronic respiratory failure with hypoxia (HCC) Active Problems:   CAP (community acquired pneumonia)   ILD (interstitial lung disease) (HCC)   Acute on chronic heart failure with preserved ejection fraction (HFpEF) (HCC)   AKI (acute kidney injury)   Essential hypertension   Type 2 diabetes mellitus with diabetic mononeuropathy, without long-term current use of insulin  (HCC)   Anxiety and depression   Chronic pancreatitis (HCC)    Acute on chronic respiratory failure with hypoxia Interstitial lung disease Stage IV lung cancer - Acute worsening is multifactorial given his lung cancer, ILD, volume overload. - 11/23 CTA: Diffuse parenchymal opacity in the left upper lobe likely representing chronic infiltrate with similar findings in the superior segment of the left lower lobe.  No new focal mass - Encourage incentive spirometry and flutter valve - On prednisone , continue.  Will taper at DC - On treatment for CAP, Augmentin  - Continue prednisone   CAP - Productive cough but no fever, no chills, no tachypnea.  No leukocytosis. - Continue with Augmentin   Hyponatremia,  acute on chronic - Acute worsening may be secondary to Lasix , have discontinued - Hyponatremia chronic secondary to lung malignancy.  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 Melanee oncology. - Status  post chemoradiation with cisplatin  etoposide  and will be receiving palliative radiation to left humerus.  On maintenance durvalumab .  Considering prophylactic brain radiation  Wheelchair-bound at baseline - Lives independently at baseline - Home health has been ordered, TOC working on arrangements.  Acute on chronic heart failure with preserved EF 50 to 55%.  Grade 1 diastolic dysfunction - Clinically euvolemic, CXR with possible pulmonary edema and proBNP over 1000 - CTA without evidence of pulmonary vascular congestion - Worsening hyponatremia with minimal improvement in respirations.  Discontinue Lasix  today - Continue with spironolactone  - Follows with heart failure clinic.  Hypertension - Blood pressures have been soft.  Discontinue amlodipine .  Follow closely.  AKI - Baseline creatinine 0.91, creatinine 1.74 - Likely multifactorial, perhaps some degree of dehydration due to poor oral intake in combination with diuretic use.  Minimal improvement since admission.  May be nearing a new baseline  Type 2 diabetes with diabetic mononeuropathy without long-term use of insulin  - Continue sliding scale coverage while admitted  Anxiety Depression - Continue home meds  Chronic pancreatitis - Continue Creon  and PPI  Body mass index is 25.62 kg/m. - Outpatient follow up for lifestyle modification and risk factor management   DVT prophylaxis: lovenox    Code Status: Full Code Disposition:  Inpatient pending clinical resolution, hopefully home tomorrow  Consultants:    Procedures:    Antimicrobials:  Anti-infectives (From admission, onward)    Start     Dose/Rate Route Frequency Ordered Stop   07/11/24 2200  amoxicillin -clavulanate (AUGMENTIN ) 875-125 MG per tablet 1 tablet        1 tablet Oral Every 12 hours 07/11/24 1505 07/14/24 2159   07/10/24 1800  cefTRIAXone  (ROCEPHIN ) 2 g in sodium chloride  0.9 % 100 mL IVPB  Status:  Discontinued        2 g 200 mL/hr over 30  Minutes Intravenous Every 24 hours 07/09/24 2017 07/11/24 1505   07/10/24 1800  azithromycin  (ZITHROMAX ) 500 mg in sodium chloride  0.9 % 250 mL IVPB  Status:  Discontinued        500 mg 250 mL/hr over 60 Minutes Intravenous Every 24 hours 07/09/24 2017 07/11/24 1505   07/09/24 1815  cefTRIAXone  (ROCEPHIN ) 2 g in sodium chloride  0.9 % 100 mL IVPB        2 g 200 mL/hr over 30 Minutes Intravenous  Once 07/09/24 1805 07/09/24 1915   07/09/24 1815  azithromycin  (ZITHROMAX ) 500 mg in sodium chloride  0.9 % 250 mL IVPB        500 mg 250 mL/hr over 60 Minutes Intravenous  Once 07/09/24 1805 07/09/24 2019       Data Reviewed: I have personally reviewed following labs and imaging studies CBC: Recent Labs  Lab 07/09/24 1740 07/10/24 0327 07/11/24 0355  WBC 8.8 5.5 8.4  NEUTROABS 6.6  --   --   HGB 11.4* 10.4* 9.7*  HCT 35.1* 33.0* 30.8*  MCV 103.2* 103.4* 103.4*  PLT 243 227 248   Basic Metabolic Panel: Recent Labs  Lab 07/09/24 1740 07/10/24 0327 07/11/24 0355 07/13/24 0348  NA 133* 126* 131* 128*  K 4.2 4.6 4.4 4.7  CL 101 97* 100 96*  CO2 18* 17* 20*  25  GLUCOSE 82 453* 163* 115*  BUN 11 18 24* 43*  CREATININE 1.72* 1.92* 1.74* 1.72*  CALCIUM  9.0 8.1* 8.7* 8.8*  MG 2.0  --   --   --    GFR: Estimated Creatinine Clearance: 43.2 mL/min (A) (by C-G formula based on SCr of 1.72 mg/dL (H)). Liver Function Tests: Recent Labs  Lab 07/09/24 1740  AST 27  ALT 7  ALKPHOS 85  BILITOT 0.6  PROT 9.8*  ALBUMIN 3.6   CBG: Recent Labs  Lab 07/11/24 2134 07/12/24 0821 07/12/24 1222 07/12/24 1651 07/12/24 2028  GLUCAP 196* 120* 151* 155* 191*    Recent Results (from the past 240 hours)  Resp panel by RT-PCR (RSV, Flu A&B, Covid) Anterior Nasal Swab     Status: None   Collection Time: 07/09/24  5:40 PM   Specimen: Anterior Nasal Swab  Result Value Ref Range Status   SARS Coronavirus 2 by RT PCR NEGATIVE NEGATIVE Final    Comment: (NOTE) SARS-CoV-2 target nucleic acids  are NOT DETECTED.  The SARS-CoV-2 RNA is generally detectable in upper respiratory specimens during the acute phase of infection. The lowest concentration of SARS-CoV-2 viral copies this assay can detect is 138 copies/mL. A negative result does not preclude SARS-Cov-2 infection and should not be used as the sole basis for treatment or other patient management decisions. A negative result may occur with  improper specimen collection/handling, submission of specimen other than nasopharyngeal swab, presence of viral mutation(s) within the areas targeted by this assay, and inadequate number of viral copies(<138 copies/mL). A negative result must be combined with clinical observations, patient history, and epidemiological information. The expected result is Negative.  Fact Sheet for Patients:  bloggercourse.com  Fact Sheet for Healthcare Providers:  seriousbroker.it  This test is no t yet approved or cleared by the [place]  FDA and  has been authorized for detection and/or diagnosis of SARS-CoV-2 by FDA under an Emergency Use Authorization (EUA). This EUA will remain  in effect (meaning this test can be used) for the duration of the COVID-19 declaration under Section 564(b)(1) of the Act, 21 U.S.C.section 360bbb-3(b)(1), unless the authorization is terminated  or revoked sooner.       Influenza A by PCR NEGATIVE NEGATIVE Final   Influenza B by PCR NEGATIVE NEGATIVE Final    Comment: (NOTE) The Xpert Xpress SARS-CoV-2/FLU/RSV plus assay is intended as an aid in the diagnosis of influenza from Nasopharyngeal swab specimens and should not be used as a sole basis for treatment. Nasal washings and aspirates are unacceptable for Xpert Xpress SARS-CoV-2/FLU/RSV testing.  Fact Sheet for Patients: bloggercourse.com  Fact Sheet for Healthcare Providers: seriousbroker.it  This test is  not yet approved or cleared by the [place]  FDA and has been authorized for detection and/or diagnosis of SARS-CoV-2 by FDA under an Emergency Use Authorization (EUA). This EUA will remain in effect (meaning this test can be used) for the duration of the COVID-19 declaration under Section 564(b)(1) of the Act, 21 U.S.C. section 360bbb-3(b)(1), unless the authorization is terminated or revoked.     Resp Syncytial Virus by PCR NEGATIVE NEGATIVE Final    Comment: (NOTE) Fact Sheet for Patients: bloggercourse.com  Fact Sheet for Healthcare Providers: seriousbroker.it  This test is not yet approved or cleared by the [place]  FDA and has been authorized for detection and/or diagnosis of SARS-CoV-2 by FDA under an Emergency Use Authorization (EUA). This EUA will remain in effect (meaning this test can be used) for  the duration of the COVID-19 declaration under Section 564(b)(1) of the Act, 21 U.S.C. section 360bbb-3(b)(1), unless the authorization is terminated or revoked.  Performed at [facility], [address] [place], [place] [address]   Blood culture (routine x 2)     Status: None (Preliminary result)   Collection Time: 07/09/24  5:48 PM   Specimen: BLOOD  Result Value Ref Range Status   Specimen Description BLOOD RIGHT ANTECUBITAL  Final   Special Requests   Final    BOTTLES DRAWN AEROBIC AND ANAEROBIC Blood Culture results may not be optimal due to an inadequate volume of blood received in culture bottles   Culture   Final    NO GROWTH 3 DAYS Performed at [facility], [address], [place], [place] [address]    Report Status PENDING  Incomplete  Blood culture (routine x 2)     Status: None (Preliminary result)   Collection Time: 07/09/24  5:48 PM   Specimen: BLOOD  Result Value Ref Range Status   Specimen Description BLOOD LEFT ANTECUBITAL  Final   Special Requests   Final    BOTTLES  DRAWN AEROBIC AND ANAEROBIC Blood Culture results may not be optimal due to an excessive volume of blood received in culture bottles   Culture   Final    NO GROWTH 3 DAYS Performed at [facility], [address] [place], [place] [address]    Report Status PENDING  Incomplete     Radiology Studies: ECHOCARDIOGRAM COMPLETE Result Date: 07/11/2024    ECHOCARDIOGRAM REPORT   Patient Name:   JAMIL ARMWOOD Date of Exam: 07/11/2024 Medical Rec #:  [id number]     Height:       67.0 in Accession #:    [id number]    Weight:       158.5 lb Date of Birth:  1966-05-27    BSA:          1.832 m Patient Age:    58 years      BP:           115/77 mmHg Patient Gender: M             HR:           76 bpm. Exam Location:  [facility] Procedure: 2D Echo, Cardiac Doppler and Color Doppler (Both Spectral and Color            Flow Doppler were utilized during procedure). Indications:     Abnormal ECG R94.31  History:         Patient has prior history of Echocardiogram examinations, most                  recent 01/15/2024. CHF, COPD, Signs/Symptoms:Dyspnea; Risk                  Factors:Diabetes.  Sonographer:     Christopher Furnace Referring Phys:  [id number] AMRIT ADHIKARI Diagnosing Phys: Sabina Custovic IMPRESSIONS  1. Left ventricular ejection fraction, by estimation, is 60 to 65%. The left ventricle has normal function. The left ventricle has no regional wall motion abnormalities. Left ventricular diastolic parameters are consistent with Grade I diastolic dysfunction (impaired relaxation).  2. Right ventricular systolic function is normal. The right ventricular size is normal.  3. Left atrial size was mildly dilated.  4. The mitral valve is normal in structure. Trivial mitral valve regurgitation. No evidence of mitral stenosis.  5. Tricuspid valve regurgitation is severe.  6. The aortic valve  is normal in structure. Aortic valve regurgitation is mild. No aortic stenosis is present.  7. The inferior vena cava is normal in size with  greater than 50% respiratory variability, suggesting right atrial pressure of 3 mmHg. FINDINGS  Left Ventricle: Left ventricular ejection fraction, by estimation, is 60 to 65%. The left ventricle has normal function. The left ventricle has no regional wall motion abnormalities. The left ventricular internal cavity size was normal in size. There is  no left ventricular hypertrophy. Left ventricular diastolic parameters are consistent with Grade I diastolic dysfunction (impaired relaxation). Right Ventricle: The right ventricular size is normal. No increase in right ventricular wall thickness. Right ventricular systolic function is normal. Left Atrium: Left atrial size was mildly dilated. Right Atrium: Right atrial size was normal in size. Pericardium: There is no evidence of pericardial effusion. Mitral Valve: The mitral valve is normal in structure. Trivial mitral valve regurgitation. No evidence of mitral valve stenosis. MV peak gradient, 2.7 mmHg. The mean mitral valve gradient is 2.0 mmHg. Tricuspid Valve: The tricuspid valve is normal in structure. Tricuspid valve regurgitation is severe. Aortic Valve: The aortic valve is normal in structure. Aortic valve regurgitation is mild. No aortic stenosis is present. Aortic valve mean gradient measures 3.0 mmHg. Aortic valve peak gradient measures 4.8 mmHg. Aortic valve area, by VTI measures 8.19 cm. Pulmonic Valve: The pulmonic valve was normal in structure. Pulmonic valve regurgitation is not visualized. Aorta: The aortic root is normal in size and structure. Venous: The inferior vena cava is normal in size with greater than 50% respiratory variability, suggesting right atrial pressure of 3 mmHg. IAS/Shunts: No atrial level shunt detected by color flow Doppler.  LEFT VENTRICLE PLAX 2D LVIDd:         4.90 cm   Diastology LVIDs:         3.50 cm   LV e' medial:    10.60 cm/s LV PW:         1.20 cm   LV E/e' medial:  6.5 LV IVS:        1.30 cm   LV e' lateral:   11.10  cm/s LVOT diam:     3.35 cm   LV E/e' lateral: 6.2 LV SV:         115 LV SV Index:   63 LVOT Area:     8.81 cm LV IVRT:       100 msec  RIGHT VENTRICLE RV Basal diam:  3.90 cm     PULMONARY VEINS RV Mid diam:    3.50 cm     Diastolic Velocity: 33.00 cm/s RV S prime:     11.00 cm/s  S/D Velocity:       0.60                             Systolic Velocity:  19.70 cm/s LEFT ATRIUM             Index        RIGHT ATRIUM           Index LA diam:        4.40 cm 2.40 cm/m   RA Area:     17.60 cm LA Vol (A2C):   86.4 ml 47.16 ml/m  RA Volume:   45.10 ml  24.62 ml/m LA Vol (A4C):   24.7 ml 13.48 ml/m LA Biplane Vol: 50.6 ml 27.62 ml/m  AORTIC VALVE AV Area (Vmax):  5.62 cm AV Area (Vmean):   4.87 cm AV Area (VTI):     8.19 cm AV Vmax:           109.00 cm/s AV Vmean:          77.400 cm/s AV VTI:            0.141 m AV Peak Grad:      4.8 mmHg AV Mean Grad:      3.0 mmHg LVOT Vmax:         69.50 cm/s LVOT Vmean:        42.800 cm/s LVOT VTI:          0.131 m LVOT/AV VTI ratio: 0.93  AORTA Ao Root diam: 3.90 cm MITRAL VALVE               TRICUSPID VALVE MV Area (PHT): 4.77 cm    TR Peak grad:   55.1 mmHg MV Area VTI:   7.58 cm    TR Vmax:        371.00 cm/s MV Peak grad:  2.7 mmHg MV Mean grad:  2.0 mmHg    SHUNTS MV Vmax:       0.81 m/s    Systemic VTI:  0.13 m MV Vmean:      58.9 cm/s   Systemic Diam: 3.35 cm MV Decel Time: 159 msec MV E velocity: 68.70 cm/s MV A velocity: 94.90 cm/s MV E/A ratio:  0.72 Designer, Multimedia signed by Annalee Casa Signature Date/Time: 07/11/2024/3:09:43 PM    Final     Scheduled Meds:  amLODipine   10 mg Oral Daily   amoxicillin -clavulanate  1 tablet Oral Q12H   empagliflozin   10 mg Oral QAC breakfast   enoxaparin  (LOVENOX ) injection  40 mg Subcutaneous Q24H   furosemide   40 mg Intravenous Q12H   guaiFENesin   1,200 mg Oral BID   insulin  aspart  0-15 Units Subcutaneous TID WC   insulin  aspart  0-5 Units Subcutaneous QHS   lipase/protease/amylase  12,000 Units  Oral TID WC   pantoprazole   40 mg Oral Daily   pravastatin   20 mg Oral q1800   predniSONE   40 mg Oral Q breakfast   sertraline   100 mg Oral Daily   spironolactone   25 mg Oral Daily   Continuous Infusions:   LOS: 3 days  MDM: Patient is high risk for one or more organ failure.  They necessitate ongoing hospitalization for continued IV therapies and subsequent lab monitoring. Total time spent interpreting labs and vitals, reviewing the medical record, coordinating care amongst consultants and care team members, directly assessing and discussing care with the patient and/or family: 55 min  Oluwadarasimi Redmon, DO [facility]  To contact the attending physician between 7A-7P please use Epic Chat. To contact the covering physician during after hours 7P-7A, please review Amion.  07/13/2024, 8:16 AM   *This document has been created with the assistance of dictation software. Please excuse typographical errors. *

## 2024-07-13 NOTE — Plan of Care (Signed)
  Problem: Fluid Volume: Goal: Ability to maintain a balanced intake and output will improve Outcome: Progressing   Problem: Health Behavior/Discharge Planning: Goal: Ability to manage health-related needs will improve Outcome: Progressing   Problem: Nutritional: Goal: Maintenance of adequate nutrition will improve Outcome: Progressing   Problem: Skin Integrity: Goal: Risk for impaired skin integrity will decrease Outcome: Progressing   Problem: Clinical Measurements: Goal: Cardiovascular complication will be avoided Outcome: Progressing   Problem: Nutrition: Goal: Adequate nutrition will be maintained Outcome: Progressing   Problem: Elimination: Goal: Will not experience complications related to urinary retention Outcome: Progressing   Problem: Pain Managment: Goal: General experience of comfort will improve and/or be controlled Outcome: Progressing

## 2024-07-14 ENCOUNTER — Other Ambulatory Visit: Payer: Self-pay

## 2024-07-14 DIAGNOSIS — F419 Anxiety disorder, unspecified: Secondary | ICD-10-CM | POA: Diagnosis not present

## 2024-07-14 DIAGNOSIS — F32A Depression, unspecified: Secondary | ICD-10-CM | POA: Diagnosis not present

## 2024-07-14 DIAGNOSIS — J9621 Acute and chronic respiratory failure with hypoxia: Secondary | ICD-10-CM | POA: Diagnosis not present

## 2024-07-14 DIAGNOSIS — K861 Other chronic pancreatitis: Secondary | ICD-10-CM | POA: Diagnosis not present

## 2024-07-14 DIAGNOSIS — E1141 Type 2 diabetes mellitus with diabetic mononeuropathy: Secondary | ICD-10-CM | POA: Diagnosis not present

## 2024-07-14 DIAGNOSIS — N179 Acute kidney failure, unspecified: Secondary | ICD-10-CM | POA: Diagnosis not present

## 2024-07-14 DIAGNOSIS — J849 Interstitial pulmonary disease, unspecified: Secondary | ICD-10-CM | POA: Diagnosis not present

## 2024-07-14 DIAGNOSIS — I5033 Acute on chronic diastolic (congestive) heart failure: Secondary | ICD-10-CM | POA: Diagnosis not present

## 2024-07-14 DIAGNOSIS — I10 Essential (primary) hypertension: Secondary | ICD-10-CM | POA: Diagnosis not present

## 2024-07-14 LAB — CULTURE, BLOOD (ROUTINE X 2)
Culture: NO GROWTH
Culture: NO GROWTH

## 2024-07-14 LAB — COMPREHENSIVE METABOLIC PANEL WITH GFR
ALT: 17 U/L (ref 0–44)
AST: 24 U/L (ref 15–41)
Albumin: 3.2 g/dL — ABNORMAL LOW (ref 3.5–5.0)
Alkaline Phosphatase: 65 U/L (ref 38–126)
Anion gap: 7 (ref 5–15)
BUN: 46 mg/dL — ABNORMAL HIGH (ref 6–20)
CO2: 25 mmol/L (ref 22–32)
Calcium: 8.9 mg/dL (ref 8.9–10.3)
Chloride: 98 mmol/L (ref 98–111)
Creatinine, Ser: 1.79 mg/dL — ABNORMAL HIGH (ref 0.61–1.24)
GFR, Estimated: 43 mL/min — ABNORMAL LOW (ref >60)
Glucose, Bld: 152 mg/dL — ABNORMAL HIGH (ref 70–99)
Potassium: 4.2 mmol/L (ref 3.5–5.1)
Sodium: 130 mmol/L — ABNORMAL LOW (ref 135–145)
Total Bilirubin: 0.3 mg/dL (ref 0.0–1.2)
Total Protein: 8.3 g/dL — ABNORMAL HIGH (ref 6.5–8.1)

## 2024-07-14 LAB — CBC WITH DIFFERENTIAL/PLATELET
Abs Immature Granulocytes: 0.02 K/uL (ref 0.00–0.07)
Basophils Absolute: 0 K/uL (ref 0.0–0.1)
Basophils Relative: 1 %
Eosinophils Absolute: 0.2 K/uL (ref 0.0–0.5)
Eosinophils Relative: 3 %
HCT: 33.9 % — ABNORMAL LOW (ref 39.0–52.0)
Hemoglobin: 10.9 g/dL — ABNORMAL LOW (ref 13.0–17.0)
Immature Granulocytes: 0 %
Lymphocytes Relative: 10 %
Lymphs Abs: 0.8 K/uL (ref 0.7–4.0)
MCH: 32.6 pg (ref 26.0–34.0)
MCHC: 32.2 g/dL (ref 30.0–36.0)
MCV: 101.5 fL — ABNORMAL HIGH (ref 80.0–100.0)
Monocytes Absolute: 0.5 K/uL (ref 0.1–1.0)
Monocytes Relative: 7 %
Neutro Abs: 6.5 K/uL (ref 1.7–7.7)
Neutrophils Relative %: 79 %
Platelets: 292 K/uL (ref 150–400)
RBC: 3.34 MIL/uL — ABNORMAL LOW (ref 4.22–5.81)
RDW: 13.4 % (ref 11.5–15.5)
WBC: 8.1 K/uL (ref 4.0–10.5)
nRBC: 0 % (ref 0.0–0.2)

## 2024-07-14 LAB — GLUCOSE, CAPILLARY
Glucose-Capillary: 117 mg/dL — ABNORMAL HIGH (ref 70–99)
Glucose-Capillary: 176 mg/dL — ABNORMAL HIGH (ref 70–99)

## 2024-07-14 MED ORDER — PRAVASTATIN SODIUM 20 MG PO TABS
20.0000 mg | ORAL_TABLET | Freq: Every day | ORAL | 0 refills | Status: DC
  Filled 2024-07-14: qty 30, 30d supply, fill #0

## 2024-07-14 MED ORDER — OXYCODONE-ACETAMINOPHEN 5-325 MG PO TABS
2.0000 | ORAL_TABLET | Freq: Three times a day (TID) | ORAL | 0 refills | Status: AC | PRN
  Filled 2024-07-14: qty 20, 4d supply, fill #0

## 2024-07-14 MED ORDER — BLOOD PRESSURE KIT
1.0000 | PACK | Freq: Two times a day (BID) | 0 refills | Status: AC
  Filled 2024-07-14: qty 1, fill #0

## 2024-07-14 MED ORDER — FUROSEMIDE 20 MG PO TABS
20.0000 mg | ORAL_TABLET | Freq: Two times a day (BID) | ORAL | 0 refills | Status: DC | PRN
  Filled 2024-07-14: qty 30, 15d supply, fill #0

## 2024-07-14 NOTE — Progress Notes (Signed)
 Meds delivered to pt. Pt to get dressed. Pt with his cane. Pt advised to obtaing bp cuff over counter or with primary dr.

## 2024-07-14 NOTE — Plan of Care (Signed)
  Problem: Education: Goal: Ability to describe self-care measures that may prevent or decrease complications (Diabetes Survival Skills Education) will improve Outcome: Progressing Goal: Individualized Educational Video(s) Outcome: Progressing   Problem: Coping: Goal: Ability to adjust to condition or change in health will improve Outcome: Progressing   Problem: Fluid Volume: Goal: Ability to maintain a balanced intake and output will improve Outcome: Progressing   Problem: Health Behavior/Discharge Planning: Goal: Ability to identify and utilize available resources and services will improve Outcome: Progressing Goal: Ability to manage health-related needs will improve Outcome: Progressing   Problem: Metabolic: Goal: Ability to maintain appropriate glucose levels will improve Outcome: Progressing   Problem: Nutritional: Goal: Maintenance of adequate nutrition will improve Outcome: Progressing Goal: Progress toward achieving an optimal weight will improve Outcome: Progressing   Problem: Skin Integrity: Goal: Risk for impaired skin integrity will decrease Outcome: Progressing   Problem: Tissue Perfusion: Goal: Adequacy of tissue perfusion will improve Outcome: Progressing   Problem: Education: Goal: Knowledge of General Education information will improve Description: Including pain rating scale, medication(s)/side effects and non-pharmacologic comfort measures Outcome: Progressing   Problem: Health Behavior/Discharge Planning: Goal: Ability to manage health-related needs will improve Outcome: Progressing   Problem: Clinical Measurements: Goal: Ability to maintain clinical measurements within normal limits will improve Outcome: Progressing Goal: Will remain free from infection Outcome: Progressing Goal: Diagnostic test results will improve Outcome: Progressing Goal: Respiratory complications will improve Outcome: Progressing Goal: Cardiovascular complication will  be avoided Outcome: Progressing   Problem: Activity: Goal: Risk for activity intolerance will decrease Outcome: Progressing   Problem: Nutrition: Goal: Adequate nutrition will be maintained Outcome: Progressing   Problem: Coping: Goal: Level of anxiety will decrease Outcome: Progressing   Problem: Elimination: Goal: Will not experience complications related to bowel motility Outcome: Progressing Goal: Will not experience complications related to urinary retention Outcome: Progressing   Problem: Pain Managment: Goal: General experience of comfort will improve and/or be controlled Outcome: Progressing   Problem: Safety: Goal: Ability to remain free from injury will improve Outcome: Progressing   Problem: Skin Integrity: Goal: Risk for impaired skin integrity will decrease Outcome: Progressing   Problem: Education: Goal: Ability to demonstrate management of disease process will improve Outcome: Progressing Goal: Ability to verbalize understanding of medication therapies will improve Outcome: Progressing Goal: Individualized Educational Video(s) Outcome: Progressing   Problem: Activity: Goal: Capacity to carry out activities will improve Outcome: Progressing   Problem: Cardiac: Goal: Ability to achieve and maintain adequate cardiopulmonary perfusion will improve Outcome: Progressing   Problem: Education: Goal: Knowledge of disease or condition will improve Outcome: Progressing Goal: Knowledge of the prescribed therapeutic regimen will improve Outcome: Progressing Goal: Individualized Educational Video(s) Outcome: Progressing   Problem: Activity: Goal: Ability to tolerate increased activity will improve Outcome: Progressing Goal: Will verbalize the importance of balancing activity with adequate rest periods Outcome: Progressing   Problem: Respiratory: Goal: Ability to maintain a clear airway will improve Outcome: Progressing Goal: Levels of oxygenation  will improve Outcome: Progressing Goal: Ability to maintain adequate ventilation will improve Outcome: Progressing   Problem: Activity: Goal: Ability to tolerate increased activity will improve Outcome: Progressing   Problem: Clinical Measurements: Goal: Ability to maintain a body temperature in the normal range will improve Outcome: Progressing   Problem: Respiratory: Goal: Ability to maintain adequate ventilation will improve Outcome: Progressing Goal: Ability to maintain a clear airway will improve Outcome: Progressing

## 2024-07-14 NOTE — Discharge Summary (Addendum)
 DISCHARGE SUMMARY    Ezriel Boffa FMW:[id number] DOB: 1966/02/26 DOA: 07/09/2024  PCP: Antonette Angeline ORN, NP  Admit date: 07/09/2024 Discharge date: 07/14/2024   Recommendations for Outpatient Follow-up:  Follow up with PCP in 1-2 weeks to continue chronic medication management.  Follow-up on blood pressure and edema. 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 He was increasingly tachycardic, tachypneic respiratory viral panel was negative.  BNP was elevated at 1000.  Lab work showed creatinine of 1.7.  Chest x-ray showed increasing bibasilar predominant interstitial prominence: Pulm edema versus atypical infection on the background of interstitial lung disease.  Started on steroids, antibiotics, iv lasix .  He gradually diuresed and was slowly weaned to his baseline oxygen  requirement.  On 11/28 he reported feeling improved and back to his baseline.  Home health was arranged prior to DC. Medications and DME delivered to bedside.    Acute on chronic respiratory failure with hypoxia Interstitial lung disease Stage IV lung cancer - Acute worsening is multifactorial given his lung cancer, ILD, volume overload. - 11/23 CTA: Diffuse parenchymal opacity in the left upper lobe likely representing chronic infiltrate with similar findings in the superior segment of the left lower lobe.  No new focal mass - Encourage incentive spirometry and flutter valve - Status post 6 days prednisone  therapy. - Status post  ceftriaxone /azithromycin , later Augmentin  x 5 days for CAP treatment.   CAP - Productive cough but no fever, no chills, no tachypnea.  Pneumonia difficult to distinguish given his interstitial lung disease, lung cancer, and vascular congestion. - Status post antibiotic therapy  Hyponatremia, acute on chronic - Acute worsening likely secondary to Lasix .  Improved when discontinued. - Hyponatremia chronic secondary to lung malignancy.   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 Melanee oncology. - Status post chemoradiation with cisplatin  etoposide  and will be receiving palliative radiation to left humerus.  On maintenance durvalumab .  Considering prophylactic brain radiation - Complaining of leg pain during this admission.  Norco sent for neoplasm associated pain at DC   [facility] at baseline - Lives independently at baseline - Home health has been ordered, TOC working on arrangements.   Acute on chronic heart failure with preserved EF 50 to 55%.  Grade 1 diastolic dysfunction - Clinically euvolemic, CXR with possible pulmonary edema and proBNP over 1000 - CTA without evidence of pulmonary vascular congestion - Worsening hyponatremia Lasix  discontinued 11/27 - Continue with spironolactone  daily, as needed Lasix  at home - Follows with heart failure clinic.   Hypertension - Blood pressures have been soft.  Discontinue amlodipine .  Follow closely.   AKI - Baseline creatinine 0.91, creatinine 1.74 - Likely multifactorial, perhaps some degree of dehydration due to poor oral intake in combination with diuretic use.  Minimal improvement since admission.  May be nearing a new baseline - Follow-up with nephrology   Type 2 diabetes with diabetic mononeuropathy without long-term use of insulin  - Resume home meds   Anxiety Depression -  Continue home meds   Chronic  pancreatitis - Continue Creon  and PPI   Body mass index is 25.62 kg/m. - Outpatient follow up for lifestyle modification and risk factor management  Discharge Instructions  Discharge Instructions     Call MD for:  difficulty breathing, headache or visual disturbances   Complete by: As directed    Call MD for:  persistant dizziness or light-headedness   Complete by: As directed    Call MD for:  persistant nausea and vomiting   Complete by: As directed    Call MD for:  severe uncontrolled pain   Complete by: As directed    Call MD for:  temperature >100.4   Complete by: As directed    Diet general   Complete by: As directed    Discharge instructions   Complete by: As directed    While admitted we made some changes to your blood pressure medications. Please review your discharge medications closely to ensure you are taking the correct meds at home. Please take your blood pressure once a day and keep a log.   For Blood Pressure Monitor home BP readings. Goal: 140/90 or less Bring all BP readings with you to your office visits Bring your home BP cuff with you to your office visits  See your primary care doctor in one week to review this log and make further medication changes.   Increase activity slowly   Complete by: As directed    Walker rolling   Complete by: As directed       Allergies as of 07/14/2024       Reactions   Hctz [hydrochlorothiazide] Swelling   Ibuprofen Swelling        Medication List     PAUSE taking these medications    amLODipine -olmesartan  10-40 MG tablet Wait to take this until your doctor or other care provider tells you to start again. Commonly known as: AZOR  Take 1 tablet by mouth daily.       STOP taking these medications    lovastatin  10 MG tablet Commonly known as: MEVACOR  Replaced by: pravastatin  20 MG tablet       TAKE these medications    Accu-Chek Softclix Lancets lancets Use with blood glucose monitor in the  morning, at noon, and at bedtime. May substitute to any manufacturer covered by patient's insurance.   aspirin  EC 81 MG tablet Take 1 tablet (81 mg total) by mouth daily. Swallow whole.   Blood Glucose Monitor System w/Device Kit Check blood sugar in the morning, at noon, and at bedtime. May substitute to any manufacturer covered by patient's insurance.   Blood Pressure Kit 1 each by Does not apply route 2 (two) times daily.   Compressor/Nebulizer Misc Nebulizer and tubes/mask to use as directed with DuoNeb as needed   furosemide  20 MG tablet Commonly known as: Lasix  Take 1 tablet (20 mg total) by mouth 2 (two) times daily as needed for fluid or edema (take up to twice a day when feeling volume overloaded).   ipratropium-albuterol  0.5-2.5 (3) MG/3ML Soln Commonly known as: DUONEB Take 3 mLs by nebulization every 2 (two) hours as needed (wheeze, SOB). Can use instead of albuterol  inhaler   Jardiance  10 MG Tabs tablet Generic drug: empagliflozin  Take 1 tablet (10 mg total) by mouth daily before breakfast.   metFORMIN  500 MG tablet Commonly known as: GLUCOPHAGE  Take 1 tablet (500 mg total) by mouth daily with breakfast. What changed: when to take this   omeprazole  20  MG capsule Commonly known as: PRILOSEC Take 1 capsule (20 mg total) by mouth daily.   oxyCODONE -acetaminophen  5-325 MG tablet Commonly known as: Percocet Take 2 tablets by mouth every 8 (eight) hours as needed for up to 5 days for severe pain (pain score 7-10).   OXYGEN  Inhale 2 L into the lungs as needed.   pravastatin  20 MG tablet Commonly known as: PRAVACHOL  Take 1 tablet (20 mg total) by mouth daily at 6 PM. Replaces: lovastatin  10 MG tablet   sertraline  100 MG tablet Commonly known as: ZOLOFT  TAKE 1 TABLET BY MOUTH ONCE DAILY   spironolactone  25 MG tablet Commonly known as: ALDACTONE  Take 1 tablet (25 mg total) by mouth daily.   traZODone  150 MG tablet Commonly known as: DESYREL  Take 1 tablet  (150 mg total) by mouth at bedtime as needed for sleep.   Ventolin  HFA 108 (90 Base) MCG/ACT inhaler Generic drug: albuterol  Inhale 1-2 puffs into the lungs every 6 (six) hours as needed for wheezing or shortness of breath (or coughing).   Zenpep  15000-47000 units Cpep Generic drug: Pancrelipase  (Lip-Prot-Amyl) Take 1 capsule (15,000 Units total) by mouth 3 (three) times daily.        Follow-up Information     [facility]. Go on 07/24/2024.   Specialty: Cardiology Why: Hospital Follow-Up 07/24/24 @ 10:00AM Please bring all medications to follow-up appointment [facility], [address]  Free Valet Parking at the door. Contact information: [address] [place]  [address] [phone]               Allergies  Allergen Reactions   Hctz [Hydrochlorothiazide] Swelling   Ibuprofen Swelling    Consultations:    Procedures/Studies: ECHOCARDIOGRAM COMPLETE Result Date: 07/11/2024    ECHOCARDIOGRAM REPORT   Patient Name:   OLGA SEYLER Date of Exam: 07/11/2024 Medical Rec #:  [id number]     Height:       67.0 in Accession #:    [id number]    Weight:       158.5 lb Date of Birth:  1966-08-11    BSA:          1.832 m Patient Age:    58 years      BP:           115/77 mmHg Patient Gender: M             HR:           76 bpm. Exam Location:  [facility] Procedure: 2D Echo, Cardiac Doppler and Color Doppler (Both Spectral and Color            Flow Doppler were utilized during procedure). Indications:     Abnormal ECG R94.31  History:         Patient has prior history of Echocardiogram examinations, most                  recent 01/15/2024. CHF, COPD, Signs/Symptoms:Dyspnea; Risk                  Factors:Diabetes.  Sonographer:     Christopher Furnace Referring Phys:  [id number] AMRIT ADHIKARI Diagnosing Phys: Sabina Custovic IMPRESSIONS  1. Left ventricular ejection fraction, by estimation, is 60 to 65%. The left  ventricle has normal function. The left ventricle has no regional wall motion abnormalities. Left ventricular diastolic parameters are consistent with Grade I diastolic dysfunction (impaired relaxation).  2. Right ventricular systolic function is  normal. The right ventricular size is normal.  3. Left atrial size was mildly dilated.  4. The mitral valve is normal in structure. Trivial mitral valve regurgitation. No evidence of mitral stenosis.  5. Tricuspid valve regurgitation is severe.  6. The aortic valve is normal in structure. Aortic valve regurgitation is mild. No aortic stenosis is present.  7. The inferior vena cava is normal in size with greater than 50% respiratory variability, suggesting right atrial pressure of 3 mmHg. FINDINGS  Left Ventricle: Left ventricular ejection fraction, by estimation, is 60 to 65%. The left ventricle has normal function. The left ventricle has no regional wall motion abnormalities. The left ventricular internal cavity size was normal in size. There is  no left ventricular hypertrophy. Left ventricular diastolic parameters are consistent with Grade I diastolic dysfunction (impaired relaxation). Right Ventricle: The right ventricular size is normal. No increase in right ventricular wall thickness. Right ventricular systolic function is normal. Left Atrium: Left atrial size was mildly dilated. Right Atrium: Right atrial size was normal in size. Pericardium: There is no evidence of pericardial effusion. Mitral Valve: The mitral valve is normal in structure. Trivial mitral valve regurgitation. No evidence of mitral valve stenosis. MV peak gradient, 2.7 mmHg. The mean mitral valve gradient is 2.0 mmHg. Tricuspid Valve: The tricuspid valve is normal in structure. Tricuspid valve regurgitation is severe. Aortic Valve: The aortic valve is normal in structure. Aortic valve regurgitation is mild. No aortic stenosis is present. Aortic valve mean gradient measures 3.0 mmHg. Aortic valve peak  gradient measures 4.8 mmHg. Aortic valve area, by VTI measures 8.19 cm. Pulmonic Valve: The pulmonic valve was normal in structure. Pulmonic valve regurgitation is not visualized. Aorta: The aortic root is normal in size and structure. Venous: The inferior vena cava is normal in size with greater than 50% respiratory variability, suggesting right atrial pressure of 3 mmHg. IAS/Shunts: No atrial level shunt detected by color flow Doppler.  LEFT VENTRICLE PLAX 2D LVIDd:         4.90 cm   Diastology LVIDs:         3.50 cm   LV e' medial:    10.60 cm/s LV PW:         1.20 cm   LV E/e' medial:  6.5 LV IVS:        1.30 cm   LV e' lateral:   11.10 cm/s LVOT diam:     3.35 cm   LV E/e' lateral: 6.2 LV SV:         115 LV SV Index:   63 LVOT Area:     8.81 cm LV IVRT:       100 msec  RIGHT VENTRICLE RV Basal diam:  3.90 cm     PULMONARY VEINS RV Mid diam:    3.50 cm     Diastolic Velocity: 33.00 cm/s RV S prime:     11.00 cm/s  S/D Velocity:       0.60                             Systolic Velocity:  19.70 cm/s LEFT ATRIUM             Index        RIGHT ATRIUM           Index LA diam:        4.40 cm 2.40 cm/m   RA Area:     17.60  cm LA Vol (A2C):   86.4 ml 47.16 ml/m  RA Volume:   45.10 ml  24.62 ml/m LA Vol (A4C):   24.7 ml 13.48 ml/m LA Biplane Vol: 50.6 ml 27.62 ml/m  AORTIC VALVE AV Area (Vmax):    5.62 cm AV Area (Vmean):   4.87 cm AV Area (VTI):     8.19 cm AV Vmax:           109.00 cm/s AV Vmean:          77.400 cm/s AV VTI:            0.141 m AV Peak Grad:      4.8 mmHg AV Mean Grad:      3.0 mmHg LVOT Vmax:         69.50 cm/s LVOT Vmean:        42.800 cm/s LVOT VTI:          0.131 m LVOT/AV VTI ratio: 0.93  AORTA Ao Root diam: 3.90 cm MITRAL VALVE               TRICUSPID VALVE MV Area (PHT): 4.77 cm    TR Peak grad:   55.1 mmHg MV Area VTI:   7.58 cm    TR Vmax:        371.00 cm/s MV Peak grad:  2.7 mmHg MV Mean grad:  2.0 mmHg    SHUNTS MV Vmax:       0.81 m/s    Systemic VTI:  0.13 m MV Vmean:      58.9  cm/s   Systemic Diam: 3.35 cm MV Decel Time: 159 msec MV E velocity: 68.70 cm/s MV A velocity: 94.90 cm/s MV E/A ratio:  0.72 Sabina Custovic Electronically signed by Annalee Casa Signature Date/Time: 07/11/2024/3:09:43 PM    Final    US  RENAL Result Date: 07/10/2024 EXAM: US  Retroperitoneum Complete, Renal. 07/10/2024 03:46:22 PM TECHNIQUE: Real-time ultrasonography of the retroperitoneum renal was performed. COMPARISON: None available CLINICAL HISTORY: Acute kidney disease. FINDINGS: FINDINGS: RIGHT KIDNEY/URETER: Right kidney measures 9.7 x 5.8 x 5.9 cm (174 cc). Mild scattered scarring leading to a lobulated contour as shown on prior exams. Normal cortical echogenicity. No hydronephrosis. No calculus. No mass. LEFT KIDNEY/URETER: Left kidney measures 10.1 x 6.4 x 5.2 cm (175 cc). Mild scattered scarring leading to a lobulated contour as shown on prior exams. Normal cortical echogenicity. No hydronephrosis. No calculus. No mass. BLADDER: Unremarkable appearance of the bladder. IMPRESSION: 1. Mild scattered bilateral renal cortical scarring with lobulated contour, unchanged from prior exams. Electronically signed by: Ryan Salvage MD 07/10/2024 09:26 PM EST RP Workstation: [id number]   CT Angio Chest Pulmonary Embolism (PE) W or WO Contrast Result Date: 07/09/2024 EXAM: CTA of the Chest with contrast for PE 07/09/2024 10:53:27 PM TECHNIQUE: CTA of the chest was performed after the administration of intravenous contrast. 75 ml omnipaque  350 Multiplanar reformatted images are provided for review. MIP images are provided for review. Automated exposure control, iterative reconstruction, and/or weight based adjustment of the mA/kV was utilized to reduce the radiation dose to as low as reasonably achievable. COMPARISON: 01/16/2024, 01/25/2024 PET CT. CLINICAL HISTORY: Chest pain and positive D-dimer. History of left upper lobe lung carcinoma. FINDINGS: PULMONARY ARTERIES: The pulmonary artery shows no  evidence of pulmonary embolism. Main pulmonary artery is normal in caliber. MEDIASTINUM: The heart is not significantly enlarged in size. Coronary calcifications are noted. The aorta shows atherosclerotic calcifications. No aneurysmal dilatation or dissection is seen. The esophagus, as visualized,  is within normal limits. LYMPH NODES: No mediastinal, hilar or axillary lymphadenopathy. LUNGS AND PLEURA: The lungs demonstrate diffuse emphysematous changes similar to those seen on prior exams. The previously seen left upper lobe lung mass again shows significant regression similar to that seen on prior PET CT from 1 month previous. Diffuse parenchymal opacity is noted, however, in the left upper lobe likely representing acute on chronic infiltrate. Similar findings are noted in the superior segment of the left lower lobe. Interstitial edema is seen. No new focal mass is noted. No sizable effusion is seen. No pneumothorax. UPPER ABDOMEN: Visualized upper abdomen shows diffuse calcifications throughout the pancreas consistent with chronic pancreatitis. SOFT TISSUES AND BONES: Right chest wall port is seen. Osseous structures are within normal limits. No acute soft tissue abnormality. IMPRESSION: 1. No evidence of pulmonary embolism. 2. Diffuse parenchymal opacity in the left upper lobe, likely representing acute on chronic infiltrate, with similar findings in the superior segment of the left lower lobe. No new focal mass. 3. Nearly complete resolution of the previously seen left upper lobe lung mass, stable from prior imaging. 4. Diffuse emphysematous changes in the lungs, similar to prior exams. Electronically signed by: Oneil Devonshire MD 07/09/2024 11:06 PM EST RP Workstation: [id number]   DG Chest Portable 1 View Result Date: 07/09/2024 CLINICAL DATA:  SOB, hx ILD, CHF, LUL cancer EXAM: PORTABLE CHEST 1 VIEW COMPARISON:  January 24, 2024 FINDINGS: The cardiomediastinal silhouette is unchanged in contour.RIGHT chest port  with tip terminating over the superior cavoatrial junction. No pleural effusion. No pneumothorax. Interval increase in a basilar predominant interstitial prominence. Known LEFT upper lobe malignancy is not well visualized radiographically and is favored decreased in size. IMPRESSION: Interval increase in a basilar predominant interstitial prominence. Differential considerations include pulmonary edema versus atypical infection superimposed on a background of interstitial lung disease. Electronically Signed   By: Corean Salter M.D.   On: 07/09/2024 17:58      Discharge Exam: Vitals:   07/14/24 0813 07/14/24 1125  BP: 109/80 117/80  Pulse: 70 76  Resp: 18 18  Temp: 97.6 F (36.4 C) 98 F (36.7 C)  SpO2: 93% 98%   Vitals:   07/14/24 0343 07/14/24 0344 07/14/24 0813 07/14/24 1125  BP: 120/86  109/80 117/80  Pulse: 74  70 76  Resp: 20  18 18   Temp: (!) 97.5 F (36.4 C)  97.6 F (36.4 C) 98 F (36.7 C)  TempSrc: Oral  Oral Oral  SpO2: 94%  93% 98%  Weight:  70.5 kg    Height:        Constitutional:  Normal appearance. Non toxic-appearing.  HENT: Head Normocephalic and atraumatic.  Mucous membranes are moist.  Eyes:  Extraocular intact. Conjunctivae normal.  Cardiovascular: Rate and Rhythm: Normal rate and regular rhythm.  Pulmonary: Non labored, symmetric rise of chest wall.  Skin: warm and dry. not jaundiced.  Neurological: No focal deficit present. alert. Oriented.  Psychiatric: Mood and Affect congruent.    The results of significant diagnostics from this hospitalization (including imaging, microbiology, ancillary and laboratory) are listed below for reference.     Microbiology: Recent Results (from the past 240 hours)  Resp panel by RT-PCR (RSV, Flu A&B, Covid) Anterior Nasal Swab     Status: None   Collection Time: 07/09/24  5:40 PM   Specimen: Anterior Nasal Swab  Result Value Ref Range Status   SARS Coronavirus 2 by RT PCR NEGATIVE NEGATIVE Final    Comment:  (NOTE) SARS-CoV-2 target  nucleic acids are NOT DETECTED.  The SARS-CoV-2 RNA is generally detectable in upper respiratory specimens during the acute phase of infection. The lowest concentration of SARS-CoV-2 viral copies this assay can detect is 138 copies/mL. A negative result does not preclude SARS-Cov-2 infection and should not be used as the sole basis for treatment or other patient management decisions. A negative result may occur with  improper specimen collection/handling, submission of specimen other than nasopharyngeal swab, presence of viral mutation(s) within the areas targeted by this assay, and inadequate number of viral copies(<138 copies/mL). A negative result must be combined with clinical observations, patient history, and epidemiological information. The expected result is Negative.  Fact Sheet for Patients:  bloggercourse.com  Fact Sheet for Healthcare Providers:  seriousbroker.it  This test is no t yet approved or cleared by the [place]  FDA and  has been authorized for detection and/or diagnosis of SARS-CoV-2 by FDA under an Emergency Use Authorization (EUA). This EUA will remain  in effect (meaning this test can be used) for the duration of the COVID-19 declaration under Section 564(b)(1) of the Act, 21 U.S.C.section 360bbb-3(b)(1), unless the authorization is terminated  or revoked sooner.       Influenza A by PCR NEGATIVE NEGATIVE Final   Influenza B by PCR NEGATIVE NEGATIVE Final    Comment: (NOTE) The Xpert Xpress SARS-CoV-2/FLU/RSV plus assay is intended as an aid in the diagnosis of influenza from Nasopharyngeal swab specimens and should not be used as a sole basis for treatment. Nasal washings and aspirates are unacceptable for Xpert Xpress SARS-CoV-2/FLU/RSV testing.  Fact Sheet for Patients: bloggercourse.com  Fact Sheet for Healthcare  Providers: seriousbroker.it  This test is not yet approved or cleared by the [place]  FDA and has been authorized for detection and/or diagnosis of SARS-CoV-2 by FDA under an Emergency Use Authorization (EUA). This EUA will remain in effect (meaning this test can be used) for the duration of the COVID-19 declaration under Section 564(b)(1) of the Act, 21 U.S.C. section 360bbb-3(b)(1), unless the authorization is terminated or revoked.     Resp Syncytial Virus by PCR NEGATIVE NEGATIVE Final    Comment: (NOTE) Fact Sheet for Patients: bloggercourse.com  Fact Sheet for Healthcare Providers: seriousbroker.it  This test is not yet approved or cleared by the [place]  FDA and has been authorized for detection and/or diagnosis of SARS-CoV-2 by FDA under an Emergency Use Authorization (EUA). This EUA will remain in effect (meaning this test can be used) for the duration of the COVID-19 declaration under Section 564(b)(1) of the Act, 21 U.S.C. section 360bbb-3(b)(1), unless the authorization is terminated or revoked.  Performed at [facility], [address] [place], [place] [address]   Blood culture (routine x 2)     Status: None   Collection Time: 07/09/24  5:48 PM   Specimen: BLOOD  Result Value Ref Range Status   Specimen Description BLOOD RIGHT ANTECUBITAL  Final   Special Requests   Final    BOTTLES DRAWN AEROBIC AND ANAEROBIC Blood Culture results may not be optimal due to an inadequate volume of blood received in culture bottles   Culture   Final    NO GROWTH 5 DAYS Performed at [facility], [address], [place], [place] [address]    Report Status 07/14/2024 FINAL  Final  Blood culture (routine x 2)     Status: None   Collection Time: 07/09/24  5:48 PM   Specimen: BLOOD  Result Value Ref Range Status  Specimen Description BLOOD LEFT ANTECUBITAL  Final   Special  Requests   Final    BOTTLES DRAWN AEROBIC AND ANAEROBIC Blood Culture results may not be optimal due to an excessive volume of blood received in culture bottles   Culture   Final    NO GROWTH 5 DAYS Performed at [facility], [address] [place], [place] [address]    Report Status 07/14/2024 FINAL  Final     Labs: BNP (last 3 results) Recent Labs    01/15/24 0003 02/24/24 1555  BNP 317.8* 359.6*   Basic Metabolic Panel: Recent Labs  Lab 07/09/24 1740 07/10/24 0327 07/11/24 0355 07/13/24 0348 07/14/24 1016  NA 133* 126* 131* 128* 130*  K 4.2 4.6 4.4 4.7 4.2  CL 101 97* 100 96* 98  CO2 18* 17* 20* 25 25  GLUCOSE 82 453* 163* 115* 152*  BUN 11 18 24* 43* 46*  CREATININE 1.72* 1.92* 1.74* 1.72* 1.79*  CALCIUM  9.0 8.1* 8.7* 8.8* 8.9  MG 2.0  --   --   --   --    Liver Function Tests: Recent Labs  Lab 07/09/24 1740 07/14/24 1016  AST 27 24  ALT 7 17  ALKPHOS 85 65  BILITOT 0.6 0.3  PROT 9.8* 8.3*  ALBUMIN 3.6 3.2*   No results for input(s): LIPASE, AMYLASE in the last 168 hours. No results for input(s): AMMONIA in the last 168 hours. CBC: Recent Labs  Lab 07/09/24 1740 07/10/24 0327 07/11/24 0355 07/14/24 1016  WBC 8.8 5.5 8.4 8.1  NEUTROABS 6.6  --   --  6.5  HGB 11.4* 10.4* 9.7* 10.9*  HCT 35.1* 33.0* 30.8* 33.9*  MCV 103.2* 103.4* 103.4* 101.5*  PLT 243 227 248 292   Cardiac Enzymes: No results for input(s): CKTOTAL, CKMB, CKMBINDEX, TROPONINI in the last 168 hours. BNP: Invalid input(s): POCBNP CBG: Recent Labs  Lab 07/13/24 1243 07/13/24 1717 07/13/24 2245 07/14/24 0816 07/14/24 1130  GLUCAP 149* 195* 198* 117* 176*   D-Dimer No results for input(s): DDIMER in the last 72 hours. Hgb A1c No results for input(s): HGBA1C in the last 72 hours. Lipid Profile No results for input(s): CHOL, HDL, LDLCALC, TRIG, CHOLHDL, LDLDIRECT in the last 72 hours. Thyroid  function studies No results for  input(s): TSH, T4TOTAL, T3FREE, THYROIDAB in the last 72 hours.  Invalid input(s): FREET3 Anemia work up No results for input(s): VITAMINB12, FOLATE, FERRITIN, TIBC, IRON, RETICCTPCT in the last 72 hours. Urinalysis    Component Value Date/Time   COLORURINE COLORLESS (A) 01/15/2024 1142   APPEARANCEUR CLEAR (A) 01/15/2024 1142   LABSPEC 1.007 01/15/2024 1142   PHURINE 5.0 01/15/2024 1142   GLUCOSEU >=500 (A) 01/15/2024 1142   HGBUR NEGATIVE 01/15/2024 1142   BILIRUBINUR NEGATIVE 01/15/2024 1142   BILIRUBINUR Negative 10/20/2018 1443   KETONESUR NEGATIVE 01/15/2024 1142   PROTEINUR NEGATIVE 01/15/2024 1142   UROBILINOGEN 0.2 10/20/2018 1443   NITRITE NEGATIVE 01/15/2024 1142   LEUKOCYTESUR NEGATIVE 01/15/2024 1142   Sepsis Labs Recent Labs  Lab 07/09/24 1740 07/10/24 0327 07/11/24 0355 07/14/24 1016  WBC 8.8 5.5 8.4 8.1   Microbiology Recent Results (from the past 240 hours)  Resp panel by RT-PCR (RSV, Flu A&B, Covid) Anterior Nasal Swab     Status: None   Collection Time: 07/09/24  5:40 PM   Specimen: Anterior Nasal Swab  Result Value Ref Range Status   SARS Coronavirus 2 by RT PCR NEGATIVE NEGATIVE Final    Comment: (NOTE) SARS-CoV-2 target  nucleic acids are NOT DETECTED.  The SARS-CoV-2 RNA is generally detectable in upper respiratory specimens during the acute phase of infection. The lowest concentration of SARS-CoV-2 viral copies this assay can detect is 138 copies/mL. A negative result does not preclude SARS-Cov-2 infection and should not be used as the sole basis for treatment or other patient management decisions. A negative result may occur with  improper specimen collection/handling, submission of specimen other than nasopharyngeal swab, presence of viral mutation(s) within the areas targeted by this assay, and inadequate number of viral copies(<138 copies/mL). A negative result must be combined with clinical observations, patient  history, and epidemiological information. The expected result is Negative.  Fact Sheet for Patients:  bloggercourse.com  Fact Sheet for Healthcare Providers:  seriousbroker.it  This test is no t yet approved or cleared by the [place]  FDA and  has been authorized for detection and/or diagnosis of SARS-CoV-2 by FDA under an Emergency Use Authorization (EUA). This EUA will remain  in effect (meaning this test can be used) for the duration of the COVID-19 declaration under Section 564(b)(1) of the Act, 21 U.S.C.section 360bbb-3(b)(1), unless the authorization is terminated  or revoked sooner.       Influenza A by PCR NEGATIVE NEGATIVE Final   Influenza B by PCR NEGATIVE NEGATIVE Final    Comment: (NOTE) The Xpert Xpress SARS-CoV-2/FLU/RSV plus assay is intended as an aid in the diagnosis of influenza from Nasopharyngeal swab specimens and should not be used as a sole basis for treatment. Nasal washings and aspirates are unacceptable for Xpert Xpress SARS-CoV-2/FLU/RSV testing.  Fact Sheet for Patients: bloggercourse.com  Fact Sheet for Healthcare Providers: seriousbroker.it  This test is not yet approved or cleared by the [place]  FDA and has been authorized for detection and/or diagnosis of SARS-CoV-2 by FDA under an Emergency Use Authorization (EUA). This EUA will remain in effect (meaning this test can be used) for the duration of the COVID-19 declaration under Section 564(b)(1) of the Act, 21 U.S.C. section 360bbb-3(b)(1), unless the authorization is terminated or revoked.     Resp Syncytial Virus by PCR NEGATIVE NEGATIVE Final    Comment: (NOTE) Fact Sheet for Patients: bloggercourse.com  Fact Sheet for Healthcare Providers: seriousbroker.it  This test is not yet approved or cleared by the [place]  FDA  and has been authorized for detection and/or diagnosis of SARS-CoV-2 by FDA under an Emergency Use Authorization (EUA). This EUA will remain in effect (meaning this test can be used) for the duration of the COVID-19 declaration under Section 564(b)(1) of the Act, 21 U.S.C. section 360bbb-3(b)(1), unless the authorization is terminated or revoked.  Performed at [facility], [address] [place], [place] [address]   Blood culture (routine x 2)     Status: None   Collection Time: 07/09/24  5:48 PM   Specimen: BLOOD  Result Value Ref Range Status   Specimen Description BLOOD RIGHT ANTECUBITAL  Final   Special Requests   Final    BOTTLES DRAWN AEROBIC AND ANAEROBIC Blood Culture results may not be optimal due to an inadequate volume of blood received in culture bottles   Culture   Final    NO GROWTH 5 DAYS Performed at [facility], [address], [place], [place] [address]    Report Status 07/14/2024 FINAL  Final  Blood culture (routine x 2)     Status: None   Collection Time: 07/09/24  5:48 PM   Specimen: BLOOD  Result Value Ref Range Status  Specimen Description BLOOD LEFT ANTECUBITAL  Final   Special Requests   Final    BOTTLES DRAWN AEROBIC AND ANAEROBIC Blood Culture results may not be optimal due to an excessive volume of blood received in culture bottles   Culture   Final    NO GROWTH 5 DAYS Performed at [facility], [address], [place], [place] [address]    Report Status 07/14/2024 FINAL  Final     Time coordinating discharge: 32 min   SIGNED: Melville Engen, DO [facility] 07/14/2024, 12:18 PM Pager   If 7PM-7AM, please contact night-coverage

## 2024-07-14 NOTE — Plan of Care (Signed)
 Problem: Education: Goal: Ability to describe self-care measures that may prevent or decrease complications (Diabetes Survival Skills Education) will improve Outcome: Adequate for Discharge Goal: Individualized Educational Video(s) Outcome: Adequate for Discharge   Problem: Coping: Goal: Ability to adjust to condition or change in health will improve Outcome: Adequate for Discharge   Problem: Fluid Volume: Goal: Ability to maintain a balanced intake and output will improve Outcome: Adequate for Discharge   Problem: Health Behavior/Discharge Planning: Goal: Ability to identify and utilize available resources and services will improve Outcome: Adequate for Discharge Goal: Ability to manage health-related needs will improve Outcome: Adequate for Discharge   Problem: Metabolic: Goal: Ability to maintain appropriate glucose levels will improve Outcome: Adequate for Discharge   Problem: Nutritional: Goal: Maintenance of adequate nutrition will improve Outcome: Adequate for Discharge Goal: Progress toward achieving an optimal weight will improve Outcome: Adequate for Discharge   Problem: Skin Integrity: Goal: Risk for impaired skin integrity will decrease Outcome: Adequate for Discharge   Problem: Tissue Perfusion: Goal: Adequacy of tissue perfusion will improve Outcome: Adequate for Discharge   Problem: Education: Goal: Knowledge of General Education information will improve Description: Including pain rating scale, medication(s)/side effects and non-pharmacologic comfort measures Outcome: Adequate for Discharge   Problem: Health Behavior/Discharge Planning: Goal: Ability to manage health-related needs will improve Outcome: Adequate for Discharge   Problem: Clinical Measurements: Goal: Ability to maintain clinical measurements within normal limits will improve Outcome: Adequate for Discharge Goal: Will remain free from infection Outcome: Adequate for Discharge Goal:  Diagnostic test results will improve Outcome: Adequate for Discharge Goal: Respiratory complications will improve Outcome: Adequate for Discharge Goal: Cardiovascular complication will be avoided Outcome: Adequate for Discharge   Problem: Activity: Goal: Risk for activity intolerance will decrease Outcome: Adequate for Discharge   Problem: Nutrition: Goal: Adequate nutrition will be maintained Outcome: Adequate for Discharge   Problem: Coping: Goal: Level of anxiety will decrease Outcome: Adequate for Discharge   Problem: Elimination: Goal: Will not experience complications related to bowel motility Outcome: Adequate for Discharge Goal: Will not experience complications related to urinary retention Outcome: Adequate for Discharge   Problem: Pain Managment: Goal: General experience of comfort will improve and/or be controlled Outcome: Adequate for Discharge   Problem: Safety: Goal: Ability to remain free from injury will improve Outcome: Adequate for Discharge   Problem: Skin Integrity: Goal: Risk for impaired skin integrity will decrease Outcome: Adequate for Discharge   Problem: Education: Goal: Ability to demonstrate management of disease process will improve Outcome: Adequate for Discharge Goal: Ability to verbalize understanding of medication therapies will improve Outcome: Adequate for Discharge Goal: Individualized Educational Video(s) Outcome: Adequate for Discharge   Problem: Activity: Goal: Capacity to carry out activities will improve Outcome: Adequate for Discharge   Problem: Cardiac: Goal: Ability to achieve and maintain adequate cardiopulmonary perfusion will improve Outcome: Adequate for Discharge   Problem: Education: Goal: Knowledge of disease or condition will improve Outcome: Adequate for Discharge Goal: Knowledge of the prescribed therapeutic regimen will improve Outcome: Adequate for Discharge Goal: Individualized Educational  Video(s) Outcome: Adequate for Discharge   Problem: Activity: Goal: Ability to tolerate increased activity will improve Outcome: Adequate for Discharge Goal: Will verbalize the importance of balancing activity with adequate rest periods Outcome: Adequate for Discharge   Problem: Respiratory: Goal: Ability to maintain a clear airway will improve Outcome: Adequate for Discharge Goal: Levels of oxygenation will improve Outcome: Adequate for Discharge Goal: Ability to maintain adequate ventilation will improve Outcome: Adequate for Discharge  Problem: Activity: Goal: Ability to tolerate increased activity will improve Outcome: Adequate for Discharge   Problem: Clinical Measurements: Goal: Ability to maintain a body temperature in the normal range will improve Outcome: Adequate for Discharge   Problem: Respiratory: Goal: Ability to maintain adequate ventilation will improve Outcome: Adequate for Discharge Goal: Ability to maintain a clear airway will improve Outcome: Adequate for Discharge

## 2024-07-14 NOTE — Progress Notes (Signed)
 Pt eating at this time.Pt has called for a ride. Pt waiting on meds from pharmacy. Pt with his jeans, shirt, hoodie, cell phone, charger, slides, pair of shoes, bag of meds from home.

## 2024-07-17 ENCOUNTER — Telehealth: Payer: Self-pay

## 2024-07-17 NOTE — Transitions of Care (Post Inpatient/ED Visit) (Signed)
   07/17/2024  Name: Noah Velasquez MRN: [id number] DOB: Oct 18, 1965  Today's TOC FU Call Status: Today's TOC FU Call Status:: Unsuccessful Call (1st Attempt) Unsuccessful Call (1st Attempt) Date: 07/17/24  Attempted to reach the patient regarding the most recent Inpatient/ED visit.  Follow Up Plan: Additional outreach attempts will be made to reach the patient to complete the Transitions of Care (Post Inpatient/ED visit) call.   Medford Balboa, BSN, RN [place]  [facility] - [employer] [facility] RN Care Manager [phone]

## 2024-07-18 ENCOUNTER — Telehealth: Payer: Self-pay

## 2024-07-18 NOTE — Transitions of Care (Post Inpatient/ED Visit) (Signed)
   07/18/2024  Name: Noah Velasquez MRN: [id number] DOB: 09-03-1965  Today's TOC FU Call Status: Today's TOC FU Call Status:: Unsuccessful Call (2nd Attempt) Unsuccessful Call (1st Attempt) Date: 07/17/24 Unsuccessful Call (2nd Attempt) Date: 07/18/24  Attempted to reach the patient regarding the most recent Inpatient/ED visit.  Follow Up Plan: Additional outreach attempts will be made to reach the patient to complete the Transitions of Care (Post Inpatient/ED visit) call.   Medford Balboa, BSN, RN [place]  [facility] - [employer] [facility] RN Care Manager [phone]

## 2024-07-19 ENCOUNTER — Telehealth: Payer: Self-pay

## 2024-07-19 NOTE — Transitions of Care (Post Inpatient/ED Visit) (Signed)
   07/19/2024  Name: Noah Velasquez MRN: [id number] DOB: 27-Jun-1966  Today's TOC FU Call Status: Today's TOC FU Call Status:: Unsuccessful Call (3rd Attempt) Unsuccessful Call (1st Attempt) Date: 07/17/24 Unsuccessful Call (2nd Attempt) Date: 07/18/24 Unsuccessful Call (3rd Attempt) Date: 07/19/24  Attempted to reach the patient regarding the most recent Inpatient/ED visit.  Follow Up Plan: No further outreach attempts will be made at this time. We have been unable to contact the patient.  Medford Balboa, BSN, RN [place]  [facility] - [employer] [facility] RN Care Manager [phone]

## 2024-07-20 ENCOUNTER — Telehealth: Payer: Self-pay | Admitting: Pharmacy Technician

## 2024-07-20 NOTE — Telephone Encounter (Signed)
 Attempted to call patient.  Unable to reach.  Left message for patient to return my phone call.  Noah Velasquez Patient Pharmacologist [facility]

## 2024-07-21 ENCOUNTER — Telehealth: Payer: Self-pay | Admitting: Family

## 2024-07-21 NOTE — Telephone Encounter (Signed)
 Called to confirm/remind patient of their appointment at [facility] on 07/24/[facility].   Appointment:   [x] Confirmed  [] Left mess   [] No answer/No voice mail  [] VM Full/unable to leave message  [] Phone not in service  Patient reminded to bring all medications and/or complete list.  Confirmed patient has transportation. Gave directions, instructed to utilize valet parking.

## 2024-07-23 NOTE — Progress Notes (Unsigned)
 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 Was in the [employer] during [employer].   Admitted 01/14/24 with wheezing and shortness of breath. On admission, BNP was 317.8, HS-troponin was 26 > 23, UDS positive for cocaine, and TSH was 1.165. Chest x-ray noted pulmonary vascular congestion without airspace edema. 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 Echocardiogram 01/15/24 noted LVEFof 50-55%, mild LVH, grade I diastolic dysfunction. Initially needed oxygen  at 2L and unable to be weaned off of it. Brain MRI negative. Bronchoscopy and biopsy to be done as outpatient.   Seen in [facility] 06/25 where jardiance  was started.   Seen in [facility] 07/25 where spironolactone  25mg  daily was started.   Admitted 07/09/24 with 2 weeks history of productive cough, dyspnea, hypoxia on 2 L of oxygen  at home. He was increasingly tachycardic, tachypneic respiratory viral panel was negative.  BNP was elevated at 1000. Chest x-ray showed increasing bibasilar predominant interstitial prominence: Started on steroids, antibiotics, iv lasix . He gradually diuresed and was slowly weaned to his baseline oxygen  requirement. 11/23 CTA: Diffuse parenchymal opacity in the left upper lobe likely representing chronic infiltrate with similar findings in the superior segment of the left lower lobe. No new focal mass. Echo 07/11/24: EF 60-65%, G1DD, normal RV, severe TR. Hyponatremia chronic secondary to lung malignancy. Status post chemoradiation with cisplatin  etoposide  and will be receiving palliative radiation to left humerus. On  maintenance durvalumab . Considering prophylactic brain radiation. Lasix  stopped due to hyponatremia. Amlodipine  stopped due to soft BP's.   He presents today for a HF follow-up visit with a chief complaint of moderate shortness of breath (worsening). Has associated fatigue, occasional chest pain, nasal congestion, productive cough (grey and blood tinged), dizziness where he feels like he will pass out after coughing, hot/ chills but unsure if he's had a fever. Overall he says that he feels terrible and is getting worse since his recent discharge. Wearing oxygen  at 2L but when he arrived to the clinic, his tank was empty and his O2 sat was 77%. Placed on oxygen  in clinic and it rose to 82%.   Has been getting palliative radiation to left humerus.   Denies tobacco use. Drinking 1-2 beers every week (was drinking 80oz), marijuana use   ROS: All systems negative except what is listed in HPI, PMH and Problem List   Past Medical History:  Diagnosis Date   Alcohol  use disorder    Aortic atherosclerosis    Bronchiolo-alveolar adenocarcinoma of left lung (HCC)    CHF (congestive heart failure) (HCC)    Chronic hip pain    Chronic pancreatitis (HCC)    Cigarette smoker    CKD (chronic kidney disease) stage 3, GFR 30-59 ml/min (HCC)    COPD (chronic obstructive pulmonary disease) (HCC)    Crack cocaine use    as of 01-21-24, pt states he last used a few weeks ago   Depression    DM (diabetes mellitus), type 2 (HCC)    Dyspnea    ED (erectile dysfunction)    ESBL (extended spectrum beta-lactamase) producing bacteria infection 2021   GERD (gastroesophageal reflux disease)    Hyperlipidemia  Hypertension    ILD (interstitial lung disease) (HCC)    Marijuana use    Osteoarthritis    Pneumonia    PTSD (post-traumatic stress disorder)    Seizure (HCC)    x1 years ago   Viral infection of left eye     Current Outpatient Medications  Medication Sig Dispense Refill   Accu-Chek Softclix  Lancets lancets Use with blood glucose monitor in the morning, at noon, and at bedtime. May substitute to any manufacturer covered by patient's insurance. 100 each 0   [Paused] amLODipine -olmesartan  (AZOR ) 10-40 MG tablet Take 1 tablet by mouth daily. 90 tablet 1   aspirin  EC 81 MG tablet Take 1 tablet (81 mg total) by mouth daily. Swallow whole. (Patient not taking: Reported on 06/13/2024)     Blood Glucose Monitoring Suppl (BLOOD GLUCOSE MONITOR SYSTEM) w/Device KIT Check blood sugar in the morning, at noon, and at bedtime. May substitute to any manufacturer covered by patient's insurance. 1 kit 0   Blood Pressure KIT 1 each by Does not apply route 2 (two) times daily. 1 kit 0   empagliflozin  (JARDIANCE ) 10 MG TABS tablet Take 1 tablet (10 mg total) by mouth daily before breakfast. 90 tablet 1   furosemide  (LASIX ) 20 MG tablet Take 1 tablet (20 mg total) by mouth 2 (two) times daily as needed for fluid or edema (take up to twice a day when feeling volume overloaded). 30 tablet 0   ipratropium-albuterol  (DUONEB) 0.5-2.5 (3) MG/3ML SOLN Take 3 mLs by nebulization every 2 (two) hours as needed (wheeze, SOB). Can use instead of albuterol  inhaler 60 mL 1   metFORMIN  (GLUCOPHAGE ) 500 MG tablet Take 1 tablet (500 mg total) by mouth daily with breakfast. (Patient taking differently: Take 500 mg by mouth 2 (two) times daily with a meal.) 90 tablet 1   Nebulizers (COMPRESSOR/NEBULIZER) MISC Nebulizer and tubes/mask to use as directed with DuoNeb as needed 1 each 0   omeprazole  (PRILOSEC) 20 MG capsule Take 1 capsule (20 mg total) by mouth daily. 90 capsule 1   OXYGEN  Inhale 2 L into the lungs as needed.     Pancrelipase , Lip-Prot-Amyl, (ZENPEP ) 15000-47000 units CPEP Take 1 capsule (15,000 Units total) by mouth 3 (three) times daily. 270 capsule 1   pravastatin  (PRAVACHOL ) 20 MG tablet Take 1 tablet (20 mg total) by mouth daily at 6 PM. 30 tablet 0   sertraline  (ZOLOFT ) 100 MG tablet TAKE 1 TABLET BY MOUTH  ONCE DAILY 90 tablet 0   spironolactone  (ALDACTONE ) 25 MG tablet Take 1 tablet (25 mg total) by mouth daily. 90 tablet 3   traZODone  (DESYREL ) 150 MG tablet Take 1 tablet (150 mg total) by mouth at bedtime as needed for sleep. 90 tablet 1   VENTOLIN  HFA 108 (90 Base) MCG/ACT inhaler Inhale 1-2 puffs into the lungs every 6 (six) hours as needed for wheezing or shortness of breath (or coughing). 18 g 6   No current facility-administered medications for this visit.    Allergies  Allergen Reactions   Hctz [Hydrochlorothiazide] Swelling   Ibuprofen Swelling      Social History   Socioeconomic History   Marital status: Legally Separated    Spouse name: Not on file   Number of children: 2   Years of education: Not on file   Highest education level: High school graduate  Occupational History   Occupation: on disability  Tobacco Use   Smoking status: Former    Current packs/day: 3.00  Average packs/day: 3.0 packs/day for 15.0 years (45.0 ttl pk-yrs)    Types: Cigarettes   Smokeless tobacco: Never   Tobacco comments:    At his heaviest smoked 3ppd and now occasionally smoke 1 cigarette. 02/01/24  Vaping Use   Vaping status: Some Days  Substance and Sexual Activity   Alcohol  use: Yes    Alcohol /week: 7.0 standard drinks of alcohol     Types: 7 Cans of beer per week    Comment: occ   Drug use: Yes    Types: Marijuana, Crack cocaine    Comment: Marijuana every day. 7-32yrs clean of crack cocaine.   Sexual activity: Yes    Birth control/protection: Condom  Other Topics Concern   Not on file  Social History Narrative   Not on file   Social Drivers of Health   Financial Resource Strain: Not on file  Food Insecurity: No Food Insecurity (01/31/2024)   Hunger Vital Sign    Worried About Running Out of Food in the Last Year: Never true    Ran Out of Food in the Last Year: Never true  Recent Concern: Food Insecurity - Food Insecurity Present (01/15/2024)   Hunger Vital Sign     Worried About Running Out of Food in the Last Year: Sometimes true    Ran Out of Food in the Last Year: Sometimes true  Transportation Needs: No Transportation Needs (01/31/2024)   PRAPARE - Administrator, Civil Service (Medical): No    Lack of Transportation (Non-Medical): No  Physical Activity: Not on file  Stress: Not on file  Social Connections: Not on file  Intimate Partner Violence: Not At Risk (01/31/2024)   Humiliation, Afraid, Rape, and Kick questionnaire    Fear of Current or Ex-Partner: No    Emotionally Abused: No    Physically Abused: No    Sexually Abused: No      Family History  Problem Relation Age of Onset   Diabetes Mother    Sleep apnea Mother    Emphysema Sister    CAD Neg Hx    Mental illness Neg Hx    Vitals:   07/24/24 1023  BP: 106/74  Pulse: 90  SpO2: (!) 82%   Wt Readings from Last 3 Encounters:  07/14/24 155 lb 6.8 oz (70.5 kg)  06/12/24 169 lb (76.7 kg)  06/06/24 169 lb (76.7 kg)   Lab Results  Component Value Date   CREATININE 1.79 (H) 07/14/2024   CREATININE 1.72 (H) 07/13/2024   CREATININE 1.74 (H) 07/11/2024    PHYSICAL EXAM:  General: Well appearing.  Cor: No JVD. Regular rhythm, rate.  Lungs: clear Abdomen: soft, nontender, nondistended. Extremities: no edema, tips of fingers appear blue tinged Neuro:. Affect pleasant although frustrated with worsening symptoms   ECG: not done  Unable to do ReDs reading due to port in right upper chest wall   ASSESSMENT & PLAN:  1: NICM with preserved ejection fraction- - suspect due to HTN - NYHA class III - euvolemic - patient did not get weighed in clinic as he says that he feels too bad - Echo 01/15/24: LVEFof 50-55%, mild LVH, G1DD.  - Echo 07/11/24: EF 60-65%, G1DD, normal RV, severe TR. - continue jardiance  10mg  daily - continue lasix  20mg  PRN. Has not taken since discharge - continue spironolactone  25mg  daily - patient does not seem fluid up in clinic but is  quite SOB even when placed on oxygen  (see below) - proBNP 07/09/24 was 1031.0  2: HTN- -  BP 106/74 - saw PCP Georgina) 10/25 - BMET 07/14/24 reviewed: sodium 130, potassium 4.2, creatinine 1.79 & GFR 43  3: Lung cancer left upper lobe, stage IIIB- - new diagnosis 05/25 - had bronchoscopy/ biopsy done 01/24/24 - saw pulmonology (Kasa) 09/25 - PET scan done 02/16/24 - MRI of his left humerus 03/02/24: showed a 6 cm mass in the intramedullary region in the diaphysis of mid to proximal left humerus which was concerning for metastatic disease. Finished palliative treatment for left humerus.  - saw oncology Linnea) 10/25  4: DM- - A1c 06/06/24 was 7.1%  5: Substance use- - no tobacco use - drinking 1-2 cans of beer weekly (was drinking 80 oz every other day) - + marijuana - cessation discussed   6: Hyperlipidemia- - continue lovastatin  10mg  daily - LD 06/06/24 was 31  7: COPD- - recently finished antibiotics. Arrived to clinic with empty oxygen  tank and sat 79%. Placed on oxygen  tank in clinic at 2L but could not get sat >82%. Recently treated with antibiotics for possible pneumonia. With his o2 sats, worsening SOB, cough producing dizziness, discussed going to the ER for further evaluation. Patient in agreement and JAYSON Pines, RN went with patient over to the ER and let triage nurse aware that he was there.    Follow-up pending ER disposition.   I spent 46 minutes reviewing records, interviewing/ examing patient and managing plan/ orders.   Ellouise DELENA Class, FNP 07/23/24

## 2024-07-24 ENCOUNTER — Observation Stay
Admission: EM | Admit: 2024-07-24 | Discharge: 2024-07-29 | DRG: 189 | Disposition: A | Attending: Internal Medicine | Admitting: Internal Medicine

## 2024-07-24 ENCOUNTER — Encounter: Payer: Self-pay | Admitting: Family

## 2024-07-24 ENCOUNTER — Other Ambulatory Visit: Payer: Self-pay

## 2024-07-24 ENCOUNTER — Other Ambulatory Visit: Payer: Self-pay | Admitting: Internal Medicine

## 2024-07-24 ENCOUNTER — Emergency Department

## 2024-07-24 ENCOUNTER — Encounter: Payer: Self-pay | Admitting: Oncology

## 2024-07-24 ENCOUNTER — Ambulatory Visit: Attending: Family | Admitting: Family

## 2024-07-24 ENCOUNTER — Other Ambulatory Visit (HOSPITAL_COMMUNITY): Payer: Self-pay

## 2024-07-24 VITALS — BP 106/74 | HR 90

## 2024-07-24 DIAGNOSIS — I5032 Chronic diastolic (congestive) heart failure: Secondary | ICD-10-CM | POA: Diagnosis not present

## 2024-07-24 DIAGNOSIS — E785 Hyperlipidemia, unspecified: Secondary | ICD-10-CM | POA: Diagnosis not present

## 2024-07-24 DIAGNOSIS — K861 Other chronic pancreatitis: Secondary | ICD-10-CM | POA: Diagnosis present

## 2024-07-24 DIAGNOSIS — E119 Type 2 diabetes mellitus without complications: Secondary | ICD-10-CM

## 2024-07-24 DIAGNOSIS — J449 Chronic obstructive pulmonary disease, unspecified: Secondary | ICD-10-CM | POA: Diagnosis not present

## 2024-07-24 DIAGNOSIS — F4310 Post-traumatic stress disorder, unspecified: Secondary | ICD-10-CM | POA: Diagnosis present

## 2024-07-24 DIAGNOSIS — F5104 Psychophysiologic insomnia: Secondary | ICD-10-CM

## 2024-07-24 DIAGNOSIS — E1122 Type 2 diabetes mellitus with diabetic chronic kidney disease: Secondary | ICD-10-CM | POA: Diagnosis not present

## 2024-07-24 DIAGNOSIS — C3412 Malignant neoplasm of upper lobe, left bronchus or lung: Secondary | ICD-10-CM | POA: Diagnosis present

## 2024-07-24 DIAGNOSIS — I130 Hypertensive heart and chronic kidney disease with heart failure and stage 1 through stage 4 chronic kidney disease, or unspecified chronic kidney disease: Secondary | ICD-10-CM | POA: Diagnosis not present

## 2024-07-24 DIAGNOSIS — J849 Interstitial pulmonary disease, unspecified: Secondary | ICD-10-CM | POA: Diagnosis present

## 2024-07-24 DIAGNOSIS — C3490 Malignant neoplasm of unspecified part of unspecified bronchus or lung: Secondary | ICD-10-CM | POA: Diagnosis not present

## 2024-07-24 DIAGNOSIS — F1990 Other psychoactive substance use, unspecified, uncomplicated: Secondary | ICD-10-CM

## 2024-07-24 DIAGNOSIS — C3492 Malignant neoplasm of unspecified part of left bronchus or lung: Secondary | ICD-10-CM | POA: Diagnosis not present

## 2024-07-24 DIAGNOSIS — F17290 Nicotine dependence, other tobacco product, uncomplicated: Secondary | ICD-10-CM | POA: Diagnosis not present

## 2024-07-24 DIAGNOSIS — F419 Anxiety disorder, unspecified: Secondary | ICD-10-CM | POA: Diagnosis present

## 2024-07-24 DIAGNOSIS — I509 Heart failure, unspecified: Principal | ICD-10-CM

## 2024-07-24 DIAGNOSIS — Z85118 Personal history of other malignant neoplasm of bronchus and lung: Secondary | ICD-10-CM | POA: Diagnosis not present

## 2024-07-24 DIAGNOSIS — I10 Essential (primary) hypertension: Secondary | ICD-10-CM | POA: Diagnosis present

## 2024-07-24 DIAGNOSIS — E1141 Type 2 diabetes mellitus with diabetic mononeuropathy: Secondary | ICD-10-CM | POA: Diagnosis present

## 2024-07-24 DIAGNOSIS — I110 Hypertensive heart disease with heart failure: Secondary | ICD-10-CM | POA: Diagnosis not present

## 2024-07-24 DIAGNOSIS — K219 Gastro-esophageal reflux disease without esophagitis: Secondary | ICD-10-CM | POA: Diagnosis present

## 2024-07-24 DIAGNOSIS — I071 Rheumatic tricuspid insufficiency: Secondary | ICD-10-CM | POA: Diagnosis not present

## 2024-07-24 DIAGNOSIS — E1169 Type 2 diabetes mellitus with other specified complication: Secondary | ICD-10-CM | POA: Diagnosis not present

## 2024-07-24 DIAGNOSIS — N183 Chronic kidney disease, stage 3 unspecified: Secondary | ICD-10-CM | POA: Diagnosis not present

## 2024-07-24 DIAGNOSIS — Z79899 Other long term (current) drug therapy: Secondary | ICD-10-CM | POA: Diagnosis not present

## 2024-07-24 DIAGNOSIS — J9601 Acute respiratory failure with hypoxia: Secondary | ICD-10-CM | POA: Diagnosis present

## 2024-07-24 DIAGNOSIS — R0602 Shortness of breath: Secondary | ICD-10-CM | POA: Diagnosis not present

## 2024-07-24 DIAGNOSIS — J9621 Acute and chronic respiratory failure with hypoxia: Secondary | ICD-10-CM | POA: Diagnosis present

## 2024-07-24 DIAGNOSIS — I428 Other cardiomyopathies: Secondary | ICD-10-CM | POA: Diagnosis not present

## 2024-07-24 DIAGNOSIS — Z7984 Long term (current) use of oral hypoglycemic drugs: Secondary | ICD-10-CM | POA: Diagnosis not present

## 2024-07-24 LAB — CBC
HCT: 33.5 % — ABNORMAL LOW (ref 39.0–52.0)
Hemoglobin: 10.8 g/dL — ABNORMAL LOW (ref 13.0–17.0)
MCH: 31.6 pg (ref 26.0–34.0)
MCHC: 32.2 g/dL (ref 30.0–36.0)
MCV: 98 fL (ref 80.0–100.0)
Platelets: 285 K/uL (ref 150–400)
RBC: 3.42 MIL/uL — ABNORMAL LOW (ref 4.22–5.81)
RDW: 14 % (ref 11.5–15.5)
WBC: 6.8 K/uL (ref 4.0–10.5)
nRBC: 0 % (ref 0.0–0.2)

## 2024-07-24 LAB — BASIC METABOLIC PANEL WITH GFR
Anion gap: 12 (ref 5–15)
BUN: 12 mg/dL (ref 6–20)
CO2: 22 mmol/L (ref 22–32)
Calcium: 9.1 mg/dL (ref 8.9–10.3)
Chloride: 101 mmol/L (ref 98–111)
Creatinine, Ser: 1.79 mg/dL — ABNORMAL HIGH (ref 0.61–1.24)
GFR, Estimated: 43 mL/min — ABNORMAL LOW (ref >60)
Glucose, Bld: 162 mg/dL — ABNORMAL HIGH (ref 70–99)
Potassium: 4 mmol/L (ref 3.5–5.1)
Sodium: 135 mmol/L (ref 135–145)

## 2024-07-24 LAB — PROCALCITONIN: Procalcitonin: 0.18 ng/mL

## 2024-07-24 LAB — CBG MONITORING, ED
Glucose-Capillary: 220 mg/dL — ABNORMAL HIGH (ref 70–99)
Glucose-Capillary: 226 mg/dL — ABNORMAL HIGH (ref 70–99)

## 2024-07-24 LAB — BLOOD GAS, VENOUS
Acid-base deficit: 0.5 mmol/L (ref 0.0–2.0)
Bicarbonate: 24.2 mmol/L (ref 20.0–28.0)
O2 Saturation: 12.7 %
Patient temperature: 37
pCO2, Ven: 39 mmHg — ABNORMAL LOW (ref 44–60)
pH, Ven: 7.4 (ref 7.25–7.43)

## 2024-07-24 LAB — MAGNESIUM: Magnesium: 1.7 mg/dL (ref 1.7–2.4)

## 2024-07-24 LAB — RESP PANEL BY RT-PCR (RSV, FLU A&B, COVID)  RVPGX2
Influenza A by PCR: NEGATIVE
Influenza B by PCR: NEGATIVE
Resp Syncytial Virus by PCR: NEGATIVE
SARS Coronavirus 2 by RT PCR: NEGATIVE

## 2024-07-24 LAB — TROPONIN T, HIGH SENSITIVITY
Troponin T High Sensitivity: 78 ng/L — ABNORMAL HIGH (ref 0–19)
Troponin T High Sensitivity: 75 ng/L — ABNORMAL HIGH (ref 0–19)

## 2024-07-24 LAB — PRO BRAIN NATRIURETIC PEPTIDE: Pro Brain Natriuretic Peptide: 5723 pg/mL — ABNORMAL HIGH (ref <300.0)

## 2024-07-24 MED ORDER — IPRATROPIUM-ALBUTEROL 0.5-2.5 (3) MG/3ML IN SOLN
3.0000 mL | Freq: Once | RESPIRATORY_TRACT | Status: AC
  Administered 2024-07-24: 3 mL via RESPIRATORY_TRACT
  Filled 2024-07-24: qty 3

## 2024-07-24 MED ORDER — SENNOSIDES-DOCUSATE SODIUM 8.6-50 MG PO TABS: 1.0000 | ORAL_TABLET | Freq: Every evening | ORAL | Status: DC | PRN

## 2024-07-24 MED ORDER — INSULIN ASPART 100 UNIT/ML IJ SOLN
0.0000 [IU] | Freq: Every day | INTRAMUSCULAR | Status: DC
  Administered 2024-07-24: 2 [IU] via SUBCUTANEOUS
  Filled 2024-07-24: qty 2

## 2024-07-24 MED ORDER — LEVOFLOXACIN IN D5W 750 MG/150ML IV SOLN
750.0000 mg | Freq: Once | INTRAVENOUS | Status: AC
  Administered 2024-07-24: 750 mg via INTRAVENOUS
  Filled 2024-07-24: qty 150

## 2024-07-24 MED ORDER — SODIUM CHLORIDE 0.9% FLUSH
3.0000 mL | Freq: Two times a day (BID) | INTRAVENOUS | Status: DC
  Administered 2024-07-25 – 2024-07-29 (×9): 3 mL via INTRAVENOUS

## 2024-07-24 MED ORDER — IPRATROPIUM-ALBUTEROL 0.5-2.5 (3) MG/3ML IN SOLN
3.0000 mL | Freq: Four times a day (QID) | RESPIRATORY_TRACT | Status: DC
  Administered 2024-07-24 – 2024-07-25 (×4): 3 mL via RESPIRATORY_TRACT
  Filled 2024-07-24 (×4): qty 3

## 2024-07-24 MED ORDER — FUROSEMIDE 10 MG/ML IJ SOLN
40.0000 mg | Freq: Once | INTRAMUSCULAR | Status: AC
  Administered 2024-07-24: 40 mg via INTRAVENOUS
  Filled 2024-07-24: qty 4

## 2024-07-24 MED ORDER — ENSURE PLUS HIGH PROTEIN PO LIQD
237.0000 mL | Freq: Two times a day (BID) | ORAL | Status: DC
  Administered 2024-07-25 – 2024-07-29 (×8): 237 mL via ORAL

## 2024-07-24 MED ORDER — ASPIRIN 81 MG PO TBEC
81.0000 mg | DELAYED_RELEASE_TABLET | Freq: Every day | ORAL | 3 refills | Status: DC
  Filled 2024-07-24 (×2): qty 90, 90d supply, fill #0

## 2024-07-24 MED ORDER — METHYLPREDNISOLONE SODIUM SUCC 125 MG IJ SOLR
125.0000 mg | Freq: Once | INTRAMUSCULAR | Status: AC
  Administered 2024-07-24: 125 mg via INTRAVENOUS
  Filled 2024-07-24: qty 2

## 2024-07-24 MED ORDER — BUDESONIDE 0.5 MG/2ML IN SUSP
0.5000 mg | Freq: Two times a day (BID) | RESPIRATORY_TRACT | Status: DC
  Administered 2024-07-24 – 2024-07-29 (×10): 0.5 mg via RESPIRATORY_TRACT
  Filled 2024-07-24 (×10): qty 2

## 2024-07-24 MED ORDER — ACETAMINOPHEN 650 MG RE SUPP: 650.0000 mg | Freq: Four times a day (QID) | RECTAL | Status: DC | PRN

## 2024-07-24 MED ORDER — BENZONATATE 100 MG PO CAPS
100.0000 mg | ORAL_CAPSULE | Freq: Once | ORAL | Status: AC
  Administered 2024-07-24: 100 mg via ORAL
  Filled 2024-07-24: qty 1

## 2024-07-24 MED ORDER — ACETAMINOPHEN 325 MG PO TABS
650.0000 mg | ORAL_TABLET | Freq: Four times a day (QID) | ORAL | Status: DC | PRN
  Administered 2024-07-25 (×2): 650 mg via ORAL
  Filled 2024-07-24 (×2): qty 2

## 2024-07-24 MED ORDER — HYDROMORPHONE HCL 2 MG PO TABS
2.0000 mg | ORAL_TABLET | ORAL | Status: DC | PRN
  Administered 2024-07-24: 2 mg via ORAL
  Filled 2024-07-24: qty 1

## 2024-07-24 MED ORDER — HEPARIN SODIUM (PORCINE) 5000 UNIT/ML IJ SOLN
5000.0000 [IU] | Freq: Three times a day (TID) | INTRAMUSCULAR | Status: DC
  Administered 2024-07-24 – 2024-07-29 (×15): 5000 [IU] via SUBCUTANEOUS
  Filled 2024-07-24 (×15): qty 1

## 2024-07-24 MED ORDER — TRAZODONE HCL 150 MG PO TABS
150.0000 mg | ORAL_TABLET | Freq: Every evening | ORAL | 0 refills | Status: DC | PRN
  Filled 2024-07-24 (×2): qty 90, 90d supply, fill #0

## 2024-07-24 MED ORDER — HYDROMORPHONE HCL 1 MG/ML IJ SOLN: 0.5000 mg | INTRAMUSCULAR | Status: DC | PRN

## 2024-07-24 MED ORDER — FUROSEMIDE 10 MG/ML IJ SOLN
40.0000 mg | Freq: Two times a day (BID) | INTRAMUSCULAR | Status: DC
  Administered 2024-07-24: 40 mg via INTRAVENOUS
  Filled 2024-07-24: qty 4

## 2024-07-24 MED ORDER — INSULIN ASPART 100 UNIT/ML IJ SOLN
0.0000 [IU] | Freq: Three times a day (TID) | INTRAMUSCULAR | Status: DC
  Administered 2024-07-24 – 2024-07-25 (×2): 2 [IU] via SUBCUTANEOUS
  Administered 2024-07-25: 1 [IU] via SUBCUTANEOUS
  Administered 2024-07-25: 3 [IU] via SUBCUTANEOUS
  Administered 2024-07-26 – 2024-07-28 (×4): 2 [IU] via SUBCUTANEOUS
  Administered 2024-07-28: 3 [IU] via SUBCUTANEOUS
  Filled 2024-07-24 (×3): qty 2
  Filled 2024-07-24: qty 1
  Filled 2024-07-24: qty 2
  Filled 2024-07-24: qty 1
  Filled 2024-07-24: qty 3
  Filled 2024-07-24 (×2): qty 2

## 2024-07-24 MED ORDER — PREDNISONE 20 MG PO TABS
40.0000 mg | ORAL_TABLET | Freq: Every day | ORAL | Status: AC
  Administered 2024-07-25 – 2024-07-28 (×4): 40 mg via ORAL
  Filled 2024-07-24 (×5): qty 2

## 2024-07-24 NOTE — ED Notes (Signed)
 Called CCMD for central monitoring at this time

## 2024-07-24 NOTE — ED Notes (Signed)
 Pt transported to inpatient room via his own power wheelchair. Pt insists that he ride up in his chair, pt states staff does not know how to push/transport the chair. RN advised pt that staff member could drive chair using joystick on chair while walking beside chair as pt is transported via hospital bed, pt refused this mode of transport. Pt transported to inpatient room connected to monitor via his own wheelchair accompanied and monitored by ED NT.

## 2024-07-24 NOTE — ED Notes (Signed)
 Dr. Fernand raker and made aware of pt's O2 saturation of 88% on 4L, per Dr. Fernand 88%-92% is the goal.

## 2024-07-24 NOTE — H&P (Signed)
 History and Physical    Noah Velasquez FMW:[id number] DOB: [birth year] DOA: 07/24/2024  DOS: the patient was seen and examined on 07/24/2024  PCP: Antonette Angeline ORN, NP   Patient coming from: Clinic  I have personally briefly reviewed patient's old medical records in [facility] and CareEverywhere  HPI:   Noah Velasquez is a 58 y.o. year old male with medical history of hypertension, hyperlipidemia, type 2 diabetes, chronic hypoxic respiratory failure secondary to ILD and COPD, stage IV lung cancer (diagnosed July 2025) presenting to the ED from clinic given he was noted to be hypoxic.  Initially patient's oxygen  was empty.  He was placed on 2 L he only came up to 80%.  Patient is reporting shortness of breath and coughing.  He denies any fevers or chills.  He reports that he is having difficulty with breathing when he tries to walk with a walker. States that is the limiting factor currently for him and not the hip pain that he uses the wheel chair for.    On arrival to the ED patient was noted to be HDS stable.  Lab work and imaging obtained.  CBC without leukocytosis and anemia that is at baseline.  BMP with baseline renal function and moderate hyperglycemia.  ProBNP significantly elevated compared to 2 weeks ago.  Troponin mildly elevated with repeat pending.  Respiratory panel negative for COVID, flu, RSV.  ABG obtained showed 7.4/39/24.  Chest x-ray concerning showed stable left upper lung consolidation and diffuse interstitial prominence.  On my review of the x-ray patient has more vascular congestion compared to the previous x-ray.  Given the above, [facility] contacted for admission.  Review of Systems: As mentioned in the history of present illness. All other systems reviewed and are negative.   Past Medical History:  Diagnosis Date   Alcohol  use disorder    Aortic atherosclerosis    Bronchiolo-alveolar adenocarcinoma of left lung (HCC)    CHF (congestive heart failure) (HCC)     Chronic hip pain    Chronic pancreatitis (HCC)    Cigarette smoker    CKD (chronic kidney disease) stage 3, GFR 30-59 ml/min (HCC)    COPD (chronic obstructive pulmonary disease) (HCC)    Crack cocaine use    as of 01-21-24, pt states he last used a few weeks ago   Depression    DM (diabetes mellitus), type 2 (HCC)    Dyspnea    ED (erectile dysfunction)    ESBL (extended spectrum beta-lactamase) producing bacteria infection 2021   GERD (gastroesophageal reflux disease)    Hyperlipidemia    Hypertension    ILD (interstitial lung disease) (HCC)    Marijuana use    Osteoarthritis    Pneumonia    PTSD (post-traumatic stress disorder)    Seizure (HCC)    x1 years ago   Viral infection of left eye     Past Surgical History:  Procedure Laterality Date   BRONCHOSCOPY, WITH BIOPSY USING ELECTROMAGNETIC NAVIGATION Left 01/24/2024   Procedure: BRONCHOSCOPY, WITH BIOPSY USING ELECTROMAGNETIC NAVIGATION;  Surgeon: Isaiah Scrivener, MD;  Location: [facility] ORS;  Service: Pulmonary;  Laterality: Left;   ENDOBRONCHIAL ULTRASOUND Right 01/24/2024   Procedure: ENDOBRONCHIAL ULTRASOUND (EBUS);  Surgeon: Kasa, Kurian, MD;  Location: [facility] ORS;  Service: Pulmonary;  Laterality: Right;   IR IMAGING GUIDED PORT INSERTION  03/15/2024   pancreatic stent placement       Allergies  Allergen Reactions   Hctz [Hydrochlorothiazide] Swelling   Ibuprofen Swelling  Family History  Problem Relation Age of Onset   Diabetes Mother    Sleep apnea Mother    Emphysema Sister    CAD Neg Hx    Mental illness Neg Hx     Prior to Admission medications   Medication Sig Start Date End Date Taking? Authorizing Provider  Accu-Chek Softclix Lancets lancets Use with blood glucose monitor in the morning, at noon, and at bedtime. May substitute to any manufacturer covered by patient's insurance. 02/29/24   Brahmanday, Govinda R, MD  amLODipine -olmesartan  (AZOR ) 10-40 MG tablet Take 1 tablet by mouth daily. Patient not  taking: Reported on 07/24/2024 06/29/24   Antonette Angeline ORN, NP  aspirin  EC 81 MG tablet Take 1 tablet (81 mg total) by mouth daily. Swallow whole. 07/24/24   Donette Ellouise LABOR, FNP  Blood Glucose Monitoring Suppl (BLOOD GLUCOSE MONITOR SYSTEM) w/Device KIT Check blood sugar in the morning, at noon, and at bedtime. May substitute to any manufacturer covered by patient's insurance. 02/29/24   Brahmanday, Govinda R, MD  Blood Pressure KIT 1 each by Does not apply route 2 (two) times daily. 07/14/24   Dezii, Alexandra, DO  empagliflozin  (JARDIANCE ) 10 MG TABS tablet Take 1 tablet (10 mg total) by mouth daily before breakfast. 02/24/24   Donette Ellouise LABOR, FNP  furosemide  (LASIX ) 20 MG tablet Take 1 tablet (20 mg total) by mouth 2 (two) times daily as needed for fluid or edema (take up to twice a day when feeling volume overloaded). 07/14/24 08/13/24  Dezii, Alexandra, DO  ipratropium-albuterol  (DUONEB) 0.5-2.5 (3) MG/3ML SOLN Take 3 mLs by nebulization every 2 (two) hours as needed (wheeze, SOB). Can use instead of albuterol  inhaler 02/23/24   Antonette Angeline ORN, NP  metFORMIN  (GLUCOPHAGE ) 500 MG tablet Take 1 tablet (500 mg total) by mouth daily with breakfast. 06/06/24   Antonette Angeline ORN, NP  Nebulizers (COMPRESSOR/NEBULIZER) MISC Nebulizer and tubes/mask to use as directed with DuoNeb as needed 06/03/22   Alexander, Natalie, DO  omeprazole  (PRILOSEC) 20 MG capsule Take 1 capsule (20 mg total) by mouth daily. 01/25/24   Antonette Angeline ORN, NP  OXYGEN  Inhale 2 L into the lungs as needed.    [provider]  Pancrelipase , Lip-Prot-Amyl, (ZENPEP ) 15000-47000 units CPEP Take 1 capsule (15,000 Units total) by mouth 3 (three) times daily. 12/07/23   Antonette Angeline ORN, NP  pravastatin  (PRAVACHOL ) 20 MG tablet Take 1 tablet (20 mg total) by mouth daily at 6 PM. 07/14/24 08/13/24  Dezii, Alexandra, DO  sertraline  (ZOLOFT ) 100 MG tablet TAKE 1 TABLET BY MOUTH ONCE DAILY 05/10/24   Antonette Angeline ORN, NP  spironolactone  (ALDACTONE )  25 MG tablet Take 1 tablet (25 mg total) by mouth daily. 02/24/24 08/22/24  Donette Ellouise LABOR, FNP  traZODone  (DESYREL ) 150 MG tablet Take 1 tablet (150 mg total) by mouth at bedtime as needed for sleep. 07/24/24   Antonette Angeline ORN, NP  VENTOLIN  HFA 108 (90 Base) MCG/ACT inhaler Inhale 1-2 puffs into the lungs every 6 (six) hours as needed for wheezing or shortness of breath (or coughing). 02/15/24   Kasa, Kurian, MD    Social History:  reports that he has quit smoking. His smoking use included cigarettes. He has a 45 pack-year smoking history. He has never used smokeless tobacco. He reports current alcohol  use of about 7.0 standard drinks of alcohol  per week. He reports current drug use. Drugs: Marijuana and Crack cocaine. Lives by himself. Stopped smoking last year. Drinks occasionally. Tries to do ADLs  by himself by has a hard time.    Physical Exam: Vitals:   07/24/24 1140 07/24/24 1200 07/24/24 1230 07/24/24 1330  BP: 114/80 124/77 116/82 123/81  Pulse: 89  87 (!) 101  Resp: 20 19 17 18   Temp:      SpO2: 97% 97% 100% 97%  Weight:      Height:       Gen: NAD HENT: NCAT, on [place] CV: normal heart sounds, JVD present up to mandible, No LE edema, good pulses Lung: rales up to midlungs Abd: No TTP, normal bowel sounds MSK: No asymmetry, good bulk and tone Neuro: alert and oriented   Labs on Admission: I have personally reviewed following labs and imaging studies  CBC: Recent Labs  Lab 07/24/24 1140  WBC 6.8  HGB 10.8*  HCT 33.5*  MCV 98.0  PLT 285   Basic Metabolic Panel: Recent Labs  Lab 07/24/24 1140  NA 135  K 4.0  CL 101  CO2 22  GLUCOSE 162*  BUN 12  CREATININE 1.79*  CALCIUM  9.1   GFR: Estimated Creatinine Clearance: 42.3 mL/min (A) (by C-G formula based on SCr of 1.79 mg/dL (H)). Liver Function Tests: No results for input(s): AST, ALT, ALKPHOS, BILITOT, PROT, ALBUMIN in the last 168 hours. No results for input(s): LIPASE, AMYLASE in the last 168  hours. No results for input(s): AMMONIA in the last 168 hours. Coagulation Profile: No results for input(s): INR, PROTIME in the last 168 hours. Cardiac Enzymes: No results for input(s): CKTOTAL, CKMB, CKMBINDEX, TROPONINI, TROPONINIHS in the last 168 hours. BNP (last 3 results) Recent Labs    01/15/24 0003 02/24/24 1555  BNP 317.8* 359.6*   HbA1C: No results for input(s): HGBA1C in the last 72 hours. CBG: No results for input(s): GLUCAP in the last 168 hours. Lipid Profile: No results for input(s): CHOL, HDL, LDLCALC, TRIG, CHOLHDL, LDLDIRECT in the last 72 hours. Thyroid  Function Tests: No results for input(s): TSH, T4TOTAL, FREET4, T3FREE, THYROIDAB in the last 72 hours. Anemia Panel: No results for input(s): VITAMINB12, FOLATE, FERRITIN, TIBC, IRON, RETICCTPCT in the last 72 hours. Urine analysis:    Component Value Date/Time   COLORURINE COLORLESS (A) 01/15/2024 1142   APPEARANCEUR CLEAR (A) 01/15/2024 1142   LABSPEC 1.007 01/15/2024 1142   PHURINE 5.0 01/15/2024 1142   GLUCOSEU >=500 (A) 01/15/2024 1142   HGBUR NEGATIVE 01/15/2024 1142   BILIRUBINUR NEGATIVE 01/15/2024 1142   BILIRUBINUR Negative 10/20/2018 1443   KETONESUR NEGATIVE 01/15/2024 1142   PROTEINUR NEGATIVE 01/15/2024 1142   UROBILINOGEN 0.2 10/20/2018 1443   NITRITE NEGATIVE 01/15/2024 1142   LEUKOCYTESUR NEGATIVE 01/15/2024 1142    Radiological Exams on Admission: I have personally reviewed images DG Chest 2 View Result Date: 07/24/2024 EXAM: 2 VIEW(S) XRAY OF THE CHEST 07/24/2024 12:01:00 PM COMPARISON: 07/09/2024 CLINICAL HISTORY: sob FINDINGS: LINES, TUBES AND DEVICES: Right chest wall Port-A-Cath in place. LUNGS AND PLEURA: Diffuse interstitial prominence. Stable left upper lung patchy consolidation/mass. No pleural effusion. No pneumothorax. HEART AND MEDIASTINUM: Aortic calcification. No acute abnormality of the cardiac and mediastinal  silhouettes. BONES AND SOFT TISSUES: No acute osseous abnormality. IMPRESSION: 1. Stable left upper lung patchy consolidation/mass. 2. Diffuse interstitial prominence. Electronically signed by: Evalene Coho MD 07/24/2024 12:50 PM EST RP Workstation: [id number]    EKG: My personal interpretation of EKG shows: sinus rhythm with some non specific ST changes that are previously present.    Assessment/Plan Principal Problem:   Acute on chronic respiratory failure with hypoxia (HCC) Active  Problems:   ILD (interstitial lung disease) (HCC)   Essential hypertension   Hyperlipidemia associated with type 2 diabetes mellitus (HCC)   Type 2 diabetes mellitus with diabetic mononeuropathy, without long-term current use of insulin  (HCC)   Anxiety and depression   Chronic pancreatitis (HCC)   PTSD (post-traumatic stress disorder)   GERD (gastroesophageal reflux disease)   CHF (congestive heart failure) (HCC)   Small cell lung cancer, left upper lobe (HCC)   Patient with acute on chronic hypoxic respiratory failure that appears to be multifactorial including COPD and CHF exacerbation.  Patient is complaining of coughing and worsening shortness of breath he needs currently no symptoms of COPD exacerbation.  Exam and elevated BNP consistent with concomitant CHF exacerbation.  Even though lungs are showing an infiltration versus mass and I favor this to be less given his lung cancer and it is unchanged from the previous imaging.  He is afebrile so we will check procalcitonin prior to continuing further antibiotics. -Oral prednisone  40 mg for 4 more days.  Will add doxycycline  given patient has prolonged QTc and unable to use azithromycin . -IV Lasix  40 mg twice daily -Monitor daily weights, strict inputs and outputs -Wean oxygen  as able. -Follow-up procalcitonin level -Follow blood cultures  Type 2 diabetes: Holding home medicines.  Started on sensitive SSI  Hypertension: Hold home meds.  Patient is  currently normotensive and I am actively diuresing patient.  GERD: Continue home PPI  Hyperlipidemia: Continue home statin  MDD/GAD/PTSD: Continue home meds  Chronic pancreatitis: Continue pancreatic enzyme replacement.  Deconditioning: consulting PT/OT.   VTE prophylaxis:  SQ Heparin   Diet: HH Code Status:  Full Code Telemetry:  Admission status: Observation, Telemetry bed Patient is from: Home Anticipated d/c is to: Home Anticipated d/c is in: 1-2 days   Family Communication: Updated at bedside  Consults called: None   Severity of Illness: The appropriate patient status for this patient is OBSERVATION. Observation status is judged to be reasonable and necessary in order to provide the required intensity of service to ensure the patient's safety. The patient's presenting symptoms, physical exam findings, and initial radiographic and laboratory data in the context of their medical condition is felt to place them at decreased risk for further clinical deterioration. Furthermore, it is anticipated that the patient will be medically stable for discharge from the hospital within 2 midnights of admission.    Morene Bathe, MD [facility] DEL. [facility]

## 2024-07-24 NOTE — ED Triage Notes (Signed)
 Pt comes with sob especially with ambulation. Pt went to heart clinic and O2 was empty. Pt states cough and he gets dizzy. Pt states his head gets dizzy and makes he confused for a moment. Pt states cp when he cough. Pt states gray black sputum.

## 2024-07-24 NOTE — ED Notes (Signed)
 Pt increased to 3L  and O2 at 88%. Pt does have copd.

## 2024-07-24 NOTE — ED Notes (Signed)
 Pt comes from heart failure clinic with low O2. Pt wears 2L at home and arrived with empty tank. Pt wO2 was 77  RA. Pt placed on 2L and went to 79-80%.

## 2024-07-24 NOTE — ED Provider Notes (Signed)
 [facility] Provider Note    Event Date/Time   First MD Initiated Contact with Patient 07/24/24 1120     (approximate)   History   Shortness of Breath   HPI  Noah Velasquez is a 58 y.o. 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  Patient's oxygen  level on 2 L was 79 to 80%.  There was concerns and his O2 had run out at home but even with placing on home oxygen  he was still low.  On review of records patient did have a CTA done on November 23 that showed diffuse parenchymal opacities in the left upper lobe likely representing chronic infiltrate but no new mass or PE.  Patient went to heart failure clinic today with worsening shortness of breath fatigue, occasional chest pain with nasal congestion of and productive cough.   I reviewed the notes where patient was admitted to the hospital from 11/23 until 11/28.  Initially was treated for potential CHF but they discontinued Lasix  after patient got hyponatremic and patient was recommended on spironolactone  daily and as needed Lasix .  Patient is wheelchair-bound at baseline.  He was also treated for possible pneumonia at that time with ceftriaxone , azithromycin  and Augmentin , received steroids for 6 days.  Patient reports slight improvement of symptoms but then started to get worse again.  He reports not eating will take a few steps without significant shortness of breath.  He states that at home he has difficulties even standing without feeling short of breath and therefore he is having difficulties doing his normal ADLs.  He reports coughing up some sputum.  No new leg swelling or any other concerns.  He reports having taken his Lasix  recently.   Physical Exam   Triage Vital Signs: ED Triage Vitals  Encounter Vitals Group     BP 07/24/24 1118 130/80     Girls  Systolic BP Percentile --      Girls Diastolic BP Percentile --      Boys Systolic BP Percentile --      Boys Diastolic BP Percentile --      Pulse Rate 07/24/24 1112 100     Resp 07/24/24 1112 20     Temp 07/24/24 1112 98 F (36.7 C)     Temp src --      SpO2 07/24/24 1112 (!) 87 %     Weight 07/24/24 1116 155 lb 6.8 oz (70.5 kg)     Height 07/24/24 1116 5' 7.5 (1.715 m)     Head Circumference --      Peak Flow --      Pain Score 07/24/24 1116 5     Pain Loc --      Pain Education --      Exclude from Growth Chart --     Most recent vital signs: Vitals:   07/24/24 1112 07/24/24 1118  BP:  130/80  Pulse: 100   Resp: 20   Temp: 98 F (36.7 C)   SpO2: (!) 87%      General: Awake, no distress.  CV:  Good peripheral perfusion.  Resp:  Normal effort.  Clear lungs Abd:  No distention.  Soft and nontender Other:  No swelling legs.  No calf tenderness   ED Results / Procedures / Treatments   Labs (all labs ordered are listed,  but only abnormal results are displayed) Labs Reviewed  BASIC METABOLIC PANEL WITH GFR - Abnormal; Notable for the following components:      Result Value   Glucose, Bld 162 (*)    Creatinine, Ser 1.79 (*)    GFR, Estimated 43 (*)    All other components within normal limits  CBC - Abnormal; Notable for the following components:   RBC 3.42 (*)    Hemoglobin 10.8 (*)    HCT 33.5 (*)    All other components within normal limits  BLOOD GAS, VENOUS - Abnormal; Notable for the following components:   pCO2, Ven 39 (*)    All other components within normal limits  RESP PANEL BY RT-PCR (RSV, FLU A&B, COVID)  [id number]  PRO BRAIN NATRIURETIC PEPTIDE  TROPONIN T, HIGH SENSITIVITY  TROPONIN T, HIGH SENSITIVITY     EKG  My interpretation of EKG:  Sinus rhythm 97 without any ST elevation but does have some ST depression in V2 and V3 with right bundle branch block but I reviewed his prior EKG from 07/10/2024 and this looks similar to then and pt  denies CP   RADIOLOGY I have reviewed the xray personally and interpreted some increased interstitial findings   PROCEDURES:  Critical Care performed: no  .1-3 Lead EKG Interpretation  Performed by: Ernest Ronal BRAVO, MD Authorized by: Ernest Ronal BRAVO, MD     Interpretation: normal     ECG rate:  80   ECG rate assessment: normal     Rhythm: sinus rhythm     Ectopy: none     Conduction: normal      MEDICATIONS ORDERED IN ED: Medications - No data to display   IMPRESSION / MDM / ASSESSMENT AND PLAN / ED COURSE  I reviewed the triage vital signs and the nursing notes.   Patient's presentation is most consistent with severe exacerbation of chronic illness.   Patient comes in with concerns for increasing shortness of breath with exertion.  Patient hypoxic with exertion on his baseline 2 L.  Workup was done to evaluate for pneumonia, CHF.  Recent CT imaging was negative for PE.  Does report significant coughing but seems more consistent with infectious process.  No unilateral leg swelling to suggest DVT he reports more concerns about his cough to the point where he gets disoriented from the coughing.  He denies any headaches or chest pain when he is not coughing.  VBG is without any evidence of hypercapnia.  BMP shows creatinine that is at baseline.  CBC shows hemoglobin at baseline  Given his history of COPD I will cover with some steroids, breathing treatment to help help with shortness of breath as well as cover him with Levaquin  to see if a stronger antibiotic will help.  He is afebrile however and otherwise well.  His chest x-ray did show some patchiness bilaterally I wonder if this could be a component of CHF so we will give a dose of Lasix  to see if this improves symptoms.  Patient's BNP is elevated and troponin elevated I do suspect that this is related to CHF.  The patient BNP is much higher than it was when he was here few days ago therefore we will discuss the hospital team for  admission due to Concerns for worsening CHF  The patient is on the cardiac monitor to evaluate for evidence of arrhythmia and/or significant heart rate changes.      FINAL CLINICAL IMPRESSION(S) / ED DIAGNOSES   Final  diagnoses:  Acute on chronic congestive heart failure, unspecified heart failure type (HCC)  Malignant neoplasm of lung, unspecified laterality, unspecified part of lung (HCC)     Rx / DC Orders   ED Discharge Orders     None        Note:  This document was prepared using Dragon voice recognition software and may include unintentional dictation errors.   Ernest Ronal BRAVO, MD 07/24/24 [phone]

## 2024-07-25 ENCOUNTER — Encounter: Payer: Self-pay | Admitting: Pharmacist

## 2024-07-25 ENCOUNTER — Other Ambulatory Visit: Payer: Self-pay

## 2024-07-25 DIAGNOSIS — Z9981 Dependence on supplemental oxygen: Secondary | ICD-10-CM | POA: Diagnosis not present

## 2024-07-25 DIAGNOSIS — E1165 Type 2 diabetes mellitus with hyperglycemia: Secondary | ICD-10-CM | POA: Diagnosis not present

## 2024-07-25 DIAGNOSIS — C3490 Malignant neoplasm of unspecified part of unspecified bronchus or lung: Secondary | ICD-10-CM | POA: Diagnosis not present

## 2024-07-25 DIAGNOSIS — J9621 Acute and chronic respiratory failure with hypoxia: Secondary | ICD-10-CM | POA: Diagnosis not present

## 2024-07-25 DIAGNOSIS — Z7984 Long term (current) use of oral hypoglycemic drugs: Secondary | ICD-10-CM | POA: Diagnosis not present

## 2024-07-25 DIAGNOSIS — I700 Atherosclerosis of aorta: Secondary | ICD-10-CM | POA: Diagnosis not present

## 2024-07-25 DIAGNOSIS — C7951 Secondary malignant neoplasm of bone: Secondary | ICD-10-CM | POA: Diagnosis not present

## 2024-07-25 DIAGNOSIS — I5032 Chronic diastolic (congestive) heart failure: Secondary | ICD-10-CM | POA: Diagnosis not present

## 2024-07-25 DIAGNOSIS — E7849 Other hyperlipidemia: Secondary | ICD-10-CM | POA: Diagnosis not present

## 2024-07-25 DIAGNOSIS — C3412 Malignant neoplasm of upper lobe, left bronchus or lung: Secondary | ICD-10-CM | POA: Diagnosis not present

## 2024-07-25 DIAGNOSIS — F4310 Post-traumatic stress disorder, unspecified: Secondary | ICD-10-CM | POA: Diagnosis not present

## 2024-07-25 DIAGNOSIS — K861 Other chronic pancreatitis: Secondary | ICD-10-CM | POA: Diagnosis not present

## 2024-07-25 DIAGNOSIS — N183 Chronic kidney disease, stage 3 unspecified: Secondary | ICD-10-CM | POA: Diagnosis not present

## 2024-07-25 DIAGNOSIS — J9601 Acute respiratory failure with hypoxia: Secondary | ICD-10-CM | POA: Diagnosis present

## 2024-07-25 DIAGNOSIS — F411 Generalized anxiety disorder: Secondary | ICD-10-CM | POA: Diagnosis not present

## 2024-07-25 DIAGNOSIS — K219 Gastro-esophageal reflux disease without esophagitis: Secondary | ICD-10-CM | POA: Diagnosis not present

## 2024-07-25 DIAGNOSIS — E1169 Type 2 diabetes mellitus with other specified complication: Secondary | ICD-10-CM | POA: Diagnosis not present

## 2024-07-25 DIAGNOSIS — F329 Major depressive disorder, single episode, unspecified: Secondary | ICD-10-CM | POA: Diagnosis not present

## 2024-07-25 DIAGNOSIS — N179 Acute kidney failure, unspecified: Secondary | ICD-10-CM | POA: Diagnosis not present

## 2024-07-25 DIAGNOSIS — I110 Hypertensive heart disease with heart failure: Secondary | ICD-10-CM | POA: Diagnosis not present

## 2024-07-25 DIAGNOSIS — Z1152 Encounter for screening for COVID-19: Secondary | ICD-10-CM | POA: Diagnosis not present

## 2024-07-25 DIAGNOSIS — E871 Hypo-osmolality and hyponatremia: Secondary | ICD-10-CM | POA: Diagnosis not present

## 2024-07-25 DIAGNOSIS — E1122 Type 2 diabetes mellitus with diabetic chronic kidney disease: Secondary | ICD-10-CM | POA: Diagnosis not present

## 2024-07-25 DIAGNOSIS — I509 Heart failure, unspecified: Secondary | ICD-10-CM | POA: Diagnosis not present

## 2024-07-25 DIAGNOSIS — E1141 Type 2 diabetes mellitus with diabetic mononeuropathy: Secondary | ICD-10-CM | POA: Diagnosis not present

## 2024-07-25 DIAGNOSIS — I5033 Acute on chronic diastolic (congestive) heart failure: Secondary | ICD-10-CM | POA: Diagnosis not present

## 2024-07-25 DIAGNOSIS — I130 Hypertensive heart and chronic kidney disease with heart failure and stage 1 through stage 4 chronic kidney disease, or unspecified chronic kidney disease: Secondary | ICD-10-CM | POA: Diagnosis not present

## 2024-07-25 DIAGNOSIS — J849 Interstitial pulmonary disease, unspecified: Secondary | ICD-10-CM | POA: Diagnosis not present

## 2024-07-25 DIAGNOSIS — Z79899 Other long term (current) drug therapy: Secondary | ICD-10-CM | POA: Diagnosis not present

## 2024-07-25 DIAGNOSIS — J449 Chronic obstructive pulmonary disease, unspecified: Secondary | ICD-10-CM | POA: Diagnosis not present

## 2024-07-25 LAB — CBC
HCT: 31.6 % — ABNORMAL LOW (ref 39.0–52.0)
Hemoglobin: 10.6 g/dL — ABNORMAL LOW (ref 13.0–17.0)
MCH: 31.9 pg (ref 26.0–34.0)
MCHC: 33.5 g/dL (ref 30.0–36.0)
MCV: 95.2 fL (ref 80.0–100.0)
Platelets: 296 K/uL (ref 150–400)
RBC: 3.32 MIL/uL — ABNORMAL LOW (ref 4.22–5.81)
RDW: 13.6 % (ref 11.5–15.5)
WBC: 5.6 K/uL (ref 4.0–10.5)
nRBC: 0 % (ref 0.0–0.2)

## 2024-07-25 LAB — BASIC METABOLIC PANEL WITH GFR
Anion gap: 12 (ref 5–15)
BUN: 26 mg/dL — ABNORMAL HIGH (ref 6–20)
CO2: 22 mmol/L (ref 22–32)
Calcium: 9.1 mg/dL (ref 8.9–10.3)
Chloride: 95 mmol/L — ABNORMAL LOW (ref 98–111)
Creatinine, Ser: 2.03 mg/dL — ABNORMAL HIGH (ref 0.61–1.24)
GFR, Estimated: 37 mL/min — ABNORMAL LOW (ref >60)
Glucose, Bld: 224 mg/dL — ABNORMAL HIGH (ref 70–99)
Potassium: 4.1 mmol/L (ref 3.5–5.1)
Sodium: 128 mmol/L — ABNORMAL LOW (ref 135–145)

## 2024-07-25 LAB — GLUCOSE, CAPILLARY
Glucose-Capillary: 154 mg/dL — ABNORMAL HIGH (ref 70–99)
Glucose-Capillary: 177 mg/dL — ABNORMAL HIGH (ref 70–99)
Glucose-Capillary: 279 mg/dL — ABNORMAL HIGH (ref 70–99)

## 2024-07-25 MED ORDER — SERTRALINE HCL 50 MG PO TABS
100.0000 mg | ORAL_TABLET | Freq: Every day | ORAL | Status: AC
  Administered 2024-07-25 – 2024-07-29 (×5): 100 mg via ORAL
  Filled 2024-07-25 (×5): qty 2

## 2024-07-25 MED ORDER — EMPAGLIFLOZIN 10 MG PO TABS
10.0000 mg | ORAL_TABLET | Freq: Every day | ORAL | Status: AC
  Administered 2024-07-25 – 2024-07-29 (×5): 10 mg via ORAL
  Filled 2024-07-25 (×6): qty 1

## 2024-07-25 MED ORDER — PRAVASTATIN SODIUM 20 MG PO TABS
20.0000 mg | ORAL_TABLET | Freq: Every day | ORAL | Status: DC
  Administered 2024-07-25 – 2024-07-28 (×4): 20 mg via ORAL
  Filled 2024-07-25 (×4): qty 1

## 2024-07-25 MED ORDER — PANCRELIPASE (LIP-PROT-AMYL) 12000-38000 UNITS PO CPEP
12000.0000 [IU] | ORAL_CAPSULE | Freq: Three times a day (TID) | ORAL | Status: DC
  Administered 2024-07-25 – 2024-07-29 (×13): 12000 [IU] via ORAL
  Filled 2024-07-25 (×13): qty 1

## 2024-07-25 MED ORDER — IPRATROPIUM-ALBUTEROL 0.5-2.5 (3) MG/3ML IN SOLN
3.0000 mL | Freq: Three times a day (TID) | RESPIRATORY_TRACT | Status: DC
  Administered 2024-07-25 – 2024-07-26 (×2): 3 mL via RESPIRATORY_TRACT
  Filled 2024-07-25 (×3): qty 3

## 2024-07-25 MED ORDER — HYDROCODONE-ACETAMINOPHEN 5-325 MG PO TABS
1.0000 | ORAL_TABLET | ORAL | Status: DC | PRN
  Administered 2024-07-26 – 2024-07-29 (×9): 1 via ORAL
  Filled 2024-07-25 (×10): qty 1

## 2024-07-25 MED ORDER — HYDROCODONE-ACETAMINOPHEN 5-325 MG PO TABS
1.0000 | ORAL_TABLET | Freq: Once | ORAL | Status: AC
  Administered 2024-07-25: 1 via ORAL
  Filled 2024-07-25: qty 1

## 2024-07-25 MED ORDER — DOXYCYCLINE HYCLATE 100 MG PO TABS
100.0000 mg | ORAL_TABLET | Freq: Two times a day (BID) | ORAL | Status: DC
  Administered 2024-07-25 – 2024-07-29 (×9): 100 mg via ORAL
  Filled 2024-07-25 (×9): qty 1

## 2024-07-25 MED ORDER — PANTOPRAZOLE SODIUM 40 MG PO TBEC
40.0000 mg | DELAYED_RELEASE_TABLET | Freq: Every day | ORAL | Status: DC
  Administered 2024-07-25 – 2024-07-29 (×5): 40 mg via ORAL
  Filled 2024-07-25 (×5): qty 1

## 2024-07-25 NOTE — Progress Notes (Signed)
 PROGRESS NOTE Noah Velasquez    DOB: 12-21-1965, 58 y.o.  FMW:[id number]    Code Status: Full Code   DOA: 07/24/2024   LOS: 0  Brief hospital course  Noah Velasquez is a 58 y.o. male with a PMH significant for hypertension, hyperlipidemia, type 2 diabetes, chronic hypoxic respiratory failure secondary to ILD and COPD, stage IV lung cancer (diagnosed July 2025) presenting to the ED from clinic given he was noted to be hypoxic.   Initially patient's oxygen  was empty.  He was placed on 2 L he only came up to 80%.  ProBNP significantly elevated compared to 2 weeks ago.  Troponin mildly elevated with repeat pending.  Respiratory panel negative for COVID, flu, RSV.  ABG obtained showed 7.4/39/24.  Chest x-ray concerning showed stable left upper lung consolidation and diffuse interstitial prominence.    07/25/24 -ongoing treatment for COPD exacerbation, CHF exacerbation. Still significantly symptomatic and hypoxic with only minimal exertion. Worse than baseline.   Assessment & Plan  Principal Problem:   Acute on chronic respiratory failure with hypoxia (HCC) Active Problems:   ILD (interstitial lung disease) (HCC)   Essential hypertension   Hyperlipidemia associated with type 2 diabetes mellitus (HCC)   Type 2 diabetes mellitus with diabetic mononeuropathy, without long-term current use of insulin  (HCC)   Anxiety and depression   Chronic pancreatitis (HCC)   PTSD (post-traumatic stress disorder)   GERD (gastroesophageal reflux disease)   CHF (congestive heart failure) (HCC)   Small cell lung cancer, left upper lobe (HCC)  Acute on chronic hypoxic respiratory failure that appears to be multifactorial including COPD and CHF exacerbation. Exam and elevated BNP consistent with concomitant CHF exacerbation.  Baseline 2Lnc -Oral prednisone  course.  Will add doxycycline  given patient has prolonged QTc and unable to use azithromycin . - was given lasix  on admission but with AKI and no LEE will hold  today -Monitor daily weights, strict inputs and outputs -Wean oxygen  as able. -PT/OT and monitor pulse ox with ambulation -Follow blood cultures   Type 2 diabetes: Holding home medicines.  Started on sensitive SSI  Hyponatremia 128, likely from diuresis which is being held today. Will monitor again tomorrow  AKI- Cr 2.03. baseline ~1.7 - BMP am   Hypertension: Hold home meds.  Patient is currently normotensive   GERD: Continue home PPI   Hyperlipidemia: Continue home statin   MDD/GAD/PTSD: Continue home meds   Chronic pancreatitis: Continue pancreatic enzyme replacement.   Deconditioning: consulting PT/OT.   Body mass index is 24.83 kg/m.  VTE ppx: heparin  injection 5,000 Units Start: 07/24/24 1400  Diet:     Diet   Diet Heart Room service appropriate? Yes; Fluid consistency: Thin   Consultants: None  Subjective 07/25/24    Pt reports feeling better than presentation. Has SOB even with sitting up for lung exam. Endorses productive cough   Objective  Blood pressure 123/85, pulse 86, temperature 97.8 F (36.6 C), resp. rate 16, height 5' 7.5 (1.715 m), weight 73 kg, SpO2 94%. No intake or output data in the 24 hours ending 07/25/24 0748 Filed Weights   07/24/24 1116 07/25/24 0500  Weight: 70.5 kg 73 kg     Physical Exam:  General: awake, alert, NAD HEENT: atraumatic, clear conjunctiva, anicteric sclera, MMM, hearing grossly normal Respiratory: normal respiratory effort. Neg wheezing. Conversational dyspnea. Crackles at bilateral bases.  Cardiovascular: extremities well perfused, quick capillary refill, normal S1/S2, RRR, no JVD, murmurs Gastrointestinal: soft, NT, ND Nervous: A&O x3. no gross focal neurologic deficits, normal  speech Extremities: moves all equally, no edema, normal tone Skin: dry, intact, normal temperature, normal color. No rashes, lesions or ulcers on exposed skin Psychiatry: normal mood, congruent affect  Labs   I have personally  reviewed the following labs and imaging studies CBC    Component Value Date/Time   WBC 5.6 07/25/2024 0546   RBC 3.32 (L) 07/25/2024 0546   HGB 10.6 (L) 07/25/2024 0546   HGB 9.3 (L) 06/12/2024 1002   HCT 31.6 (L) 07/25/2024 0546   PLT 296 07/25/2024 0546   PLT 339 06/12/2024 1002   MCV 95.2 07/25/2024 0546   MCH 31.9 07/25/2024 0546   MCHC 33.5 07/25/2024 0546   RDW 13.6 07/25/2024 0546   LYMPHSABS 0.8 07/14/2024 1016   MONOABS 0.5 07/14/2024 1016   EOSABS 0.2 07/14/2024 1016   BASOSABS 0.0 07/14/2024 1016      Latest Ref Rng & Units 07/25/2024    5:46 AM 07/24/2024   11:40 AM 07/14/2024   10:16 AM  BMP  Glucose 70 - 99 mg/dL 775  837  847   BUN 6 - 20 mg/dL 26  12  46   Creatinine 0.61 - 1.24 mg/dL 7.96  8.20  8.20   Sodium 135 - 145 mmol/L 128  135  130   Potassium 3.5 - 5.1 mmol/L 4.1  4.0  4.2   Chloride 98 - 111 mmol/L 95  101  98   CO2 22 - 32 mmol/L 22  22  25    Calcium  8.9 - 10.3 mg/dL 9.1  9.1  8.9     DG Chest 2 View Result Date: 07/24/2024 EXAM: 2 VIEW(S) XRAY OF THE CHEST 07/24/2024 12:01:00 PM COMPARISON: 07/09/2024 CLINICAL HISTORY: sob FINDINGS: LINES, TUBES AND DEVICES: Right chest wall Port-A-Cath in place. LUNGS AND PLEURA: Diffuse interstitial prominence. Stable left upper lung patchy consolidation/mass. No pleural effusion. No pneumothorax. HEART AND MEDIASTINUM: Aortic calcification. No acute abnormality of the cardiac and mediastinal silhouettes. BONES AND SOFT TISSUES: No acute osseous abnormality. IMPRESSION: 1. Stable left upper lung patchy consolidation/mass. 2. Diffuse interstitial prominence. Electronically signed by: Evalene Coho MD 07/24/2024 12:50 PM EST RP Workstation: [id number]    Disposition Plan & Communication  Patient status: Observation  Admitted From: Home Planned disposition location: Home Anticipated discharge date: 12/10 pending respiratory status improvement   Family Communication: none at bedside     Author: Marien LITTIE Piety, DO [facility] 07/25/2024, 7:48 AM   Available by Epic secure chat 7AM-7PM. If 7PM-7AM, please contact night-coverage.  [facility] contact information found on [email or web address].

## 2024-07-25 NOTE — Progress Notes (Signed)
 Heart Failure Navigator Progress Note Advanced Heart Failure Team patient of Ellouise Class, FNP.  Hospital follow-up scheduled for 08/16/24 @ 3:30 PM.  Patient aware of appointment details and added to his discharge AVS. Navigator will sign off at this time.  Charmaine Pines, RN, BSN [facility] Heart Failure Navigator Secure Chat Only

## 2024-07-25 NOTE — Progress Notes (Signed)
 Patient has had 2 episodes this morning thus far where his sats have dropped. He dropped to 86% on 4L going to bathroom and had to be put on 6L to recover then back down to 4L. Then he called out in respiratory distress and sats were in the low 70s. Pt states he was hot and started coughing. Placed on 8L to recover, given fan for room, decreased temp of room, weaned back down to 4L. Continuos pulse ox on pt

## 2024-07-25 NOTE — Progress Notes (Signed)
 Pt sats dropped to 70's with him just standing up to use urinal on 4L. Bumped up to 7L to recover. Back on 4L at rest sats 96%

## 2024-07-25 NOTE — Plan of Care (Signed)

## 2024-07-25 NOTE — Evaluation (Signed)
 Physical Therapy Evaluation Patient Details Name: Noah Velasquez MRN: [id number] DOB: Jul 16, 1966 Today's Date: 07/25/2024  History of Present Illness  Pt is a 58 y/o M admitted on 07/24/24 after presenting with c/o SOB & coughing. PMH: HTN, HLD, DM2, chronic hypoxic respiratory failure 2/2 ILD & COPD, stage IV lung CA, alcohol  use disorder, CHF, seizure, PTSD  Clinical Impression  Pt seen for PT evaluation with pt agreeable. Pt reports prior to admission he was ambulating household distances with RW, using power w/c for longer distances, bumping up steps on buttocks. On this date, pt is able to transition to standing EOB, mobilizing well, but limited by O2 needs (on 4L at rest, increased to 6L/min for mobility). Recommend ongoing PT treatment to progress mobility as able.      If plan is discharge home, recommend the following: A little help with walking and/or transfers;A little help with bathing/dressing/bathroom   Can travel by private vehicle        Equipment Recommendations Rollator (4 wheels)  Recommendations for Other Services       Functional Status Assessment Patient has had a recent decline in their functional status and demonstrates the ability to make significant improvements in function in a reasonable and predictable amount of time.     Precautions / Restrictions Precautions Precautions: Fall Precaution/Restrictions Comments: monitor O2 Restrictions Weight Bearing Restrictions Per Provider Order: No      Mobility  Bed Mobility Overal bed mobility: Modified Independent (supine<>sit, HOB elevated)                  Transfers Overall transfer level: Needs assistance Equipment used: Rolling walker (2 wheels) Transfers: Sit to/from Stand Sit to Stand: Supervision           General transfer comment: cuing re: hand placement    Ambulation/Gait Ambulation/Gait assistance: Supervision Gait Distance (Feet): 2 Feet Assistive device: Rolling walker (2  wheels) Gait Pattern/deviations: Decreased step length - right, Decreased step length - left, Decreased stride length Gait velocity: decreased     General Gait Details: side steps along EOB  Stairs            Wheelchair Mobility     Tilt Bed    Modified Rankin (Stroke Patients Only)       Balance Overall balance assessment: Needs assistance Sitting-balance support: Feet supported Sitting balance-Leahy Scale: Normal     Standing balance support: Bilateral upper extremity supported, During functional activity Standing balance-Leahy Scale: Good                               Pertinent Vitals/Pain Pain Assessment Pain Assessment: No/denies pain    Home Living Family/patient expects to be discharged to:: Private residence Living Arrangements: Alone Available Help at Discharge: Family;Available PRN/intermittently Type of Home: Apartment (townhouse) Home Access: Level entry     Alternate Level Stairs-Number of Steps: flight goes up on his butt halfway and has to rest Home Layout: Two level;Bed/bath upstairs Home Equipment: Cane - single point;Wheelchair - power;Shower Counsellor (2 wheels)      Prior Function Prior Level of Function : Independent/Modified Independent             Mobility Comments: ambulates household distances with RW, uses power w/c for longer distances ADLs Comments: IND, increased time, hard to stand long enough to cook, sits to do a lot; Cousin comes by to check on him almost daily.     Extremity/Trunk Assessment  Upper Extremity Assessment Upper Extremity Assessment: Overall WFL for tasks assessed    Lower Extremity Assessment Lower Extremity Assessment: Generalized weakness;Overall WFL for tasks assessed RLE Deficits / Details: painful, reports he needs a hip replacement    Cervical / Trunk Assessment Cervical / Trunk Assessment: Normal  Communication   Communication Communication: No apparent  difficulties    Cognition Arousal: Alert Behavior During Therapy: WFL for tasks assessed/performed   PT - Cognitive impairments: No apparent impairments                         Following commands: Intact       Cueing Cueing Techniques: Verbal cues     General Comments General comments (skin integrity, edema, etc.): Pt on 4L/min, increased to 6L/min with activity, SpO2 drops as low as 83%, extra time & cuing for pursed lip breathing for O2 recovery, pt 90% on 4L/min upon PT departure.    Exercises     Assessment/Plan    PT Assessment Patient needs continued PT services  PT Problem List Decreased strength;Decreased activity tolerance;Decreased balance;Decreased mobility;Cardiopulmonary status limiting activity       PT Treatment Interventions DME instruction;Gait training;Stair training;Functional mobility training;Therapeutic activities;Therapeutic exercise;Balance training;Patient/family education;Neuromuscular re-education    PT Goals (Current goals can be found in the Care Plan section)  Acute Rehab PT Goals Patient Stated Goal: breathing improved PT Goal Formulation: With patient Time For Goal Achievement: 08/08/24 Potential to Achieve Goals: Good    Frequency Min 2X/week     Co-evaluation               AM-PAC PT 6 Clicks Mobility  Outcome Measure Help needed turning from your back to your side while in a flat bed without using bedrails?: None Help needed moving from lying on your back to sitting on the side of a flat bed without using bedrails?: None Help needed moving to and from a bed to a chair (including a wheelchair)?: A Little Help needed standing up from a chair using your arms (e.g., wheelchair or bedside chair)?: A Little Help needed to walk in hospital room?: A Little Help needed climbing 3-5 steps with a railing? : A Lot 6 Click Score: 19    End of Session Equipment Utilized During Treatment: Oxygen  Activity Tolerance: Treatment  limited secondary to medical complications (Comment) (2/2 O2 needs) Patient left: in bed;with call bell/phone within reach;with bed alarm set Nurse Communication: Mobility status PT Visit Diagnosis: Other abnormalities of gait and mobility (R26.89);Muscle weakness (generalized) (M62.81);Unsteadiness on feet (R26.81);Difficulty in walking, not elsewhere classified (R26.2)    Time: 1140-1153 PT Time Calculation (min) (ACUTE ONLY): 13 min   Charges:   PT Evaluation $PT Eval Moderate Complexity: 1 Mod   PT General Charges $$ ACUTE PT VISIT: 1 Visit         Richerd Pinal, PT, DPT 07/25/24, 12:06 PM   Richerd CHRISTELLA Pinal 07/25/2024, 12:05 PM

## 2024-07-25 NOTE — Evaluation (Signed)
 Occupational Therapy Evaluation Patient Details Name: Noah Velasquez MRN: [id number] DOB: 05/16/1966 Today's Date: 07/25/2024   History of Present Illness   Pt is a 58 y.o. year old male presenting to the ED from clinic given he was noted to be hypoxic. Current MD assessment: Acute on chronic respiratory failure with hypoxia. PMH of hypertension, hyperlipidemia, type 2 diabetes, chronic hypoxic respiratory failure secondary to ILD and COPD on 2L baseline, stage IV lung cancer (diagnosed July 2025)     Clinical Impressions Pt was seen for OT evaluation this date. PTA, he lives alone in a 2 level townhouse with bedroom upstairs and reports MOD I with all tasks. SPC use in the home and motorized wheelchair in community with transportation to MD appts through his insurance. Cousin comes by almost daily to check on him. He has chronic R hip pain reports needing a replacement. Pt presents with deficits in strength and activity tolerance, affecting safe and optimal ADL completion. Pt currently requires MOD I for bed mobility and supervision for mobility and ADL tasks using RW this date. He is most limited by his endurance at this time requiring increased 02 for all tasks and difficulty getting good pleth for reliable readings during session, placed on 6L during activity and back to 4L at rest with sp02 of 92%. Pt is close to baseline, but will benefit from acute services and follow up OT on DC to maximize safety and independence as well as provide education on ECS.      If plan is discharge home, recommend the following:   A little help with walking and/or transfers;A little help with bathing/dressing/bathroom;Help with stairs or ramp for entrance;Assistance with cooking/housework     Functional Status Assessment   Patient has had a recent decline in their functional status and demonstrates the ability to make significant improvements in function in a reasonable and predictable amount of time.      Equipment Recommendations   None recommended by OT     Recommendations for Other Services         Precautions/Restrictions   Precautions Precautions: Fall Recall of Precautions/Restrictions: Intact Restrictions Weight Bearing Restrictions Per Provider Order: No     Mobility Bed Mobility Overal bed mobility: Modified Independent                  Transfers Overall transfer level: Modified independent Equipment used: Rolling walker (2 wheels)               General transfer comment: uses RW during session for energy conservation, moved at MOD I level with it      Balance   Sitting-balance support: Feet supported Sitting balance-Leahy Scale: Normal     Standing balance support: Bilateral upper extremity supported, No upper extremity supported, During functional activity Standing balance-Leahy Scale: Good                             ADL either performed or assessed with clinical judgement   ADL Overall ADL's : Needs assistance/impaired     Grooming: Wash/dry hands;Wash/dry face;Sitting;Standing;Set up   Upper Body Bathing: Supervision/ safety;Sitting;Set up   Lower Body Bathing: Supervison/ safety;Sitting/lateral leans;Sit to/from stand   Upper Body Dressing : Set up;Sitting       Toilet Transfer: Supervision/safety;BSC/3in1 Toilet Transfer Details (indicate cue type and reason): to perform bathing at sink         Functional mobility during ADLs: Supervision/safety;Rolling walker (2 wheels)  Vision         Perception         Praxis         Pertinent Vitals/Pain Pain Assessment Pain Assessment: 0-10 Pain Score: 10-Worst pain ever Pain Location: R hip ( chronic) Pain Descriptors / Indicators: Discomfort Pain Intervention(s): Monitored during session, Repositioned     Extremity/Trunk Assessment Upper Extremity Assessment Upper Extremity Assessment: Overall WFL for tasks assessed   Lower Extremity  Assessment Lower Extremity Assessment: Generalized weakness;RLE deficits/detail RLE Deficits / Details: painful, reports he needs a hip replacement   Cervical / Trunk Assessment Cervical / Trunk Assessment: Normal   Communication Communication Communication: No apparent difficulties   Cognition Arousal: Alert Behavior During Therapy: WFL for tasks assessed/performed                                 Following commands: Intact       Cueing  General Comments   Cueing Techniques: Verbal cues  possible desat to 79% on 4L with activity, but once rechecked back to 92% on 4L at rest in bed; placed on 6L for activity during session   Exercises Other Exercises Other Exercises: Edu on role of OT in acute setting.   Shoulder Instructions      Home Living Family/patient expects to be discharged to:: Private residence Living Arrangements: Alone Available Help at Discharge: Family;Available PRN/intermittently Type of Home: Apartment (townhouse) Home Access: Level entry     Home Layout: Two level;Bed/bath upstairs Alternate Level Stairs-Number of Steps: flight goes up on his butt halfway and has to rest Alternate Level Stairs-Rails: Can reach both Bathroom Shower/Tub: Tub/shower unit   Bathroom Toilet: Handicapped height     Home Equipment: Cane - single point;Wheelchair - power;Shower Counsellor (2 wheels)          Prior Functioning/Environment Prior Level of Function : Independent/Modified Independent             Mobility Comments: ambulatory with SPC in home, power chair for out of home. insurance assists with transportation ADLs Comments: IND, increased time, hard to stand long enough to cook, sits to do a lot; Cousin comes by to check on him almost daily.    OT Problem List: Decreased strength;Decreased activity tolerance   OT Treatment/Interventions: Self-care/ADL training;Therapeutic exercise;Balance training;Therapeutic activities;Energy  conservation;DME and/or AE instruction;Patient/family education      OT Goals(Current goals can be found in the care plan section)   Acute Rehab OT Goals Patient Stated Goal: go home OT Goal Formulation: With patient Time For Goal Achievement: 08/08/24 Potential to Achieve Goals: Good ADL Goals Pt Will Transfer to Toilet: with modified independence;ambulating Additional ADL Goal #1: Pt will demo implementation of 1 learned ECS during ADL performance 2/2 trials to prevent overexertion and maximize safety/IND.   OT Frequency:  Min 2X/week    Co-evaluation              AM-PAC OT 6 Clicks Daily Activity     Outcome Measure Help from another person eating meals?: None Help from another person taking care of personal grooming?: None Help from another person toileting, which includes using toliet, bedpan, or urinal?: A Little Help from another person bathing (including washing, rinsing, drying)?: A Little Help from another person to put on and taking off regular upper body clothing?: None Help from another person to put on and taking off regular lower body clothing?: A Little 6 Click Score:  21   End of Session Equipment Utilized During Treatment: Rolling walker (2 wheels);Oxygen  Nurse Communication: Mobility status  Activity Tolerance: Patient tolerated treatment well Patient left: in bed;with call bell/phone within reach;with bed alarm set  OT Visit Diagnosis: Other abnormalities of gait and mobility (R26.89);Muscle weakness (generalized) (M62.81)                Time: 9196-9164 OT Time Calculation (min): 32 min Charges:  OT General Charges $OT Visit: 1 Visit OT Evaluation $OT Eval Moderate Complexity: 1 Mod OT Treatments $Self Care/Home Management : 8-22 mins  Ernesto Lashway Chrismon, OTR/L 07/25/24, 10:15 AM   Taimi Towe E Chrismon 07/25/2024, 10:15 AM

## 2024-07-26 DIAGNOSIS — J9621 Acute and chronic respiratory failure with hypoxia: Secondary | ICD-10-CM | POA: Diagnosis not present

## 2024-07-26 LAB — GLUCOSE, CAPILLARY
Glucose-Capillary: 122 mg/dL — ABNORMAL HIGH (ref 70–99)
Glucose-Capillary: 132 mg/dL — ABNORMAL HIGH (ref 70–99)
Glucose-Capillary: 141 mg/dL — ABNORMAL HIGH (ref 70–99)
Glucose-Capillary: 210 mg/dL — ABNORMAL HIGH (ref 70–99)

## 2024-07-26 LAB — BASIC METABOLIC PANEL WITH GFR
Anion gap: 10 (ref 5–15)
BUN: 24 mg/dL — ABNORMAL HIGH (ref 6–20)
CO2: 23 mmol/L (ref 22–32)
Calcium: 9.4 mg/dL (ref 8.9–10.3)
Chloride: 97 mmol/L — ABNORMAL LOW (ref 98–111)
Creatinine, Ser: 1.42 mg/dL — ABNORMAL HIGH (ref 0.61–1.24)
GFR, Estimated: 57 mL/min — ABNORMAL LOW (ref >60)
Glucose, Bld: 131 mg/dL — ABNORMAL HIGH (ref 70–99)
Potassium: 3.6 mmol/L (ref 3.5–5.1)
Sodium: 130 mmol/L — ABNORMAL LOW (ref 135–145)

## 2024-07-26 MED ORDER — FUROSEMIDE 10 MG/ML IJ SOLN
40.0000 mg | Freq: Two times a day (BID) | INTRAMUSCULAR | Status: DC
  Administered 2024-07-26 – 2024-07-28 (×4): 40 mg via INTRAVENOUS
  Filled 2024-07-26 (×4): qty 4

## 2024-07-26 MED ORDER — IPRATROPIUM-ALBUTEROL 0.5-2.5 (3) MG/3ML IN SOLN
3.0000 mL | Freq: Two times a day (BID) | RESPIRATORY_TRACT | Status: DC
  Administered 2024-07-26 – 2024-07-29 (×6): 3 mL via RESPIRATORY_TRACT
  Filled 2024-07-26 (×6): qty 3

## 2024-07-26 NOTE — Progress Notes (Addendum)
 PROGRESS NOTE Noah Velasquez    DOB: 01-09-1966, 58 y.o.  FMW:[id number]    Code Status: Full Code   DOA: 07/24/2024   LOS: 1  Brief hospital course  Noah Velasquez is a 58 y.o. male with a PMH significant for hypertension, hyperlipidemia, type 2 diabetes, chronic hypoxic respiratory failure secondary to ILD and COPD, stage IV lung cancer (diagnosed July 2025) presenting to the ED from clinic given he was noted to be hypoxic.   Initially patient's oxygen  was empty.  He was placed on 2 L he only came up to 80%.  ProBNP significantly elevated compared to 2 weeks ago.  Troponin mildly elevated with repeat pending.  Respiratory panel negative for COVID, flu, RSV.  ABG obtained showed 7.4/39/24.  Chest x-ray concerning showed stable left upper lung consolidation and diffuse interstitial prominence.   12/10 -ongoing treatment for COPD exacerbation, CHF exacerbation. Still significantly symptomatic and hypoxic with only minimal exertion. Worse than baseline.   Assessment & Plan  Principal Problem:   Acute on chronic respiratory failure with hypoxia (HCC) Active Problems:   ILD (interstitial lung disease) (HCC)   Essential hypertension   Hyperlipidemia associated with type 2 diabetes mellitus (HCC)   Type 2 diabetes mellitus with diabetic mononeuropathy, without long-term current use of insulin  (HCC)   Anxiety and depression   Chronic pancreatitis (HCC)   PTSD (post-traumatic stress disorder)   GERD (gastroesophageal reflux disease)   CHF (congestive heart failure) (HCC)   Small cell lung cancer, left upper lobe (HCC)   Acute hypoxic respiratory failure (HCC)  Acute on chronic hypoxic respiratory failure  Acute COPD and CHF exacerbation.  - Exam and elevated BNP consistent with concomitant CHF exacerbation.  Baseline 2Lnc - Oral prednisone  course.  On doxycycline  given patient has prolonged QTc and unable to use azithromycin . - Resume IV lasix  with improvement in Cr -Monitor daily weights,  strict inputs and outputs -Wean oxygen  as able. -BCx NGTD   Type 2 diabetes: Holding home medicines. on sensitive SSI  Hyponatremia 130, slight improvement Continue diuresis, Monitor sodium tomorrow  AKI- Cr peaked 2.03 and improved to 1.42 - continue IV diuresis - BMP am   Hypertension: Hold home meds.  Patient is currently normotensive   GERD: Continue home PPI   Hyperlipidemia: Continue home statin   MDD/GAD/PTSD: Continue home meds   Chronic pancreatitis: Continue pancreatic enzyme replacement.   Deconditioning: PT/OT rec Home PT/OT  Body mass index is 25.17 kg/m.  VTE ppx: heparin  injection 5,000 Units Start: 07/24/24 1400  Diet:     Diet   Diet 2 gram sodium Fluid consistency: Thin   Consultants: None  Subjective 07/26/24    Pt reports feeling better than presentation. Has SOB even with sitting up for lung exam   Objective  Blood pressure 123/85, pulse 86, temperature 97.8 F (36.6 C), resp. rate 16, height 5' 7.5 (1.715 m), weight 73 kg, SpO2 94%.  Intake/Output Summary (Last 24 hours) at 07/26/2024 0811 Last data filed at 07/26/2024 0430 Gross per 24 hour  Intake 120 ml  Output 1180 ml  Net -1060 ml   Filed Weights   07/24/24 1116 07/25/24 0500 07/26/24 0500  Weight: 70.5 kg 73 kg 74 kg     Physical Exam:  General: awake, alert, NAD Respiratory: normal respiratory effort. Neg wheezing. Conversational dyspnea. Crackles at bilateral bases.  Cardiovascular: extremities well perfused, quick capillary refill, normal S1/S2, RRR, no JVD, murmurs Gastrointestinal: soft, NT, ND Nervous: A&O x3. no gross focal neurologic deficits, normal  speech Extremities: moves all equally, no edema, normal tone Skin: dry, intact, normal temperature, normal color. No rashes, lesions or ulcers on exposed skin Psychiatry: normal mood, congruent affect  Labs   I have personally reviewed the following labs and imaging studies CBC    Component Value Date/Time   WBC  5.6 07/25/2024 0546   RBC 3.32 (L) 07/25/2024 0546   HGB 10.6 (L) 07/25/2024 0546   HGB 9.3 (L) 06/12/2024 1002   HCT 31.6 (L) 07/25/2024 0546   PLT 296 07/25/2024 0546   PLT 339 06/12/2024 1002   MCV 95.2 07/25/2024 0546   MCH 31.9 07/25/2024 0546   MCHC 33.5 07/25/2024 0546   RDW 13.6 07/25/2024 0546   LYMPHSABS 0.8 07/14/2024 1016   MONOABS 0.5 07/14/2024 1016   EOSABS 0.2 07/14/2024 1016   BASOSABS 0.0 07/14/2024 1016      Latest Ref Rng & Units 07/25/2024    5:46 AM 07/24/2024   11:40 AM 07/14/2024   10:16 AM  BMP  Glucose 70 - 99 mg/dL 775  837  847   BUN 6 - 20 mg/dL 26  12  46   Creatinine 0.61 - 1.24 mg/dL 7.96  8.20  8.20   Sodium 135 - 145 mmol/L 128  135  130   Potassium 3.5 - 5.1 mmol/L 4.1  4.0  4.2   Chloride 98 - 111 mmol/L 95  101  98   CO2 22 - 32 mmol/L 22  22  25    Calcium  8.9 - 10.3 mg/dL 9.1  9.1  8.9     DG Chest 2 View Result Date: 07/24/2024 EXAM: 2 VIEW(S) XRAY OF THE CHEST 07/24/2024 12:01:00 PM COMPARISON: 07/09/2024 CLINICAL HISTORY: sob FINDINGS: LINES, TUBES AND DEVICES: Right chest wall Port-A-Cath in place. LUNGS AND PLEURA: Diffuse interstitial prominence. Stable left upper lung patchy consolidation/mass. No pleural effusion. No pneumothorax. HEART AND MEDIASTINUM: Aortic calcification. No acute abnormality of the cardiac and mediastinal silhouettes. BONES AND SOFT TISSUES: No acute osseous abnormality. IMPRESSION: 1. Stable left upper lung patchy consolidation/mass. 2. Diffuse interstitial prominence. Electronically signed by: Evalene Coho MD 07/24/2024 12:50 PM EST RP Workstation: [id number]    Disposition Plan & Communication  Patient status: Inpatient  Admitted From: Home Planned disposition location: Home Anticipated discharge date: 12/12 pending respiratory status improvement   Family Communication: none at bedside     Author: Laree Lock, MD [facility] 07/26/2024, 8:11 AM   Available by Epic secure chat  7AM-7PM. If 7PM-7AM, please contact night-coverage.  [facility] contact information found on [email or web address].

## 2024-07-26 NOTE — Plan of Care (Signed)

## 2024-07-26 NOTE — Progress Notes (Signed)
 Mobility Specialist - Progress Note  On O2 @ 4L  Pre-mobility:  SpO2-92%           During mobility: SpO2-86% recovered to >89 2 min  Post-mobility: SPO2- 93%   07/26/24 1400  Mobility  Activity Ambulated with assistance;Stood at bedside;Dangled on edge of bed  Level of Assistance Contact guard assist, steadying assist  Assistive Device Front wheel walker  Distance Ambulated (ft) 45 ft  Range of Motion/Exercises Active Assistive;All extremities  Activity Response Tolerated fair  Mobility visit 1 Mobility  Mobility Specialist Start Time (ACUTE ONLY) 1135  Mobility Specialist Stop Time (ACUTE ONLY) 1156  Mobility Specialist Time Calculation (min) (ACUTE ONLY) 21 min   Pt was supine in bed On O2 @ 4L. Pt agreed to mobility.Pt O2 vitals were taken throughout activity as a precaution. Pt is able to get to the EOB independently with bed features. Pt is able to STS independently with 2 WW. Pt did Desat during ambulation. Pt ambulated well with 2 [employer]. Pt has created a more patient gait pattern. After activity pt returned to the room back in bed with needs in reach.  Clem Rodes Mobility Specialist 07/26/24, 2:29 PM

## 2024-07-26 NOTE — TOC Initial Note (Signed)
 Transition of Care [facility]) - Initial/Assessment Note    Patient Details  Name: Noah Velasquez MRN: [id number] Date of Birth: October 10, 1965  Transition of Care [facility]) CM/SW Contact:    Nathanael CHRISTELLA Ring, RN Phone Number: 07/26/2024, 12:02 PM  Clinical Narrative:                 CM met with patient at the bedside, he is lying in bed with eyes open, introduced self and explained role in DC planning.  He is from home where he lives alone.  He has an mining engineer wheelchair in the room with him, he uses it when he is out of his apartment to get around, he uses his cane inside the apartment.  He is on chronic oxygen  at home.  He uses Medicaid transportation for appointments.  He is current with his PCP.  At discharge he says that he will call a friend with a truck to pick him up.  He is agreeable to Home health but what he really wants is a engineer, maintenance (it).  CM will try to find him information on PCS services.  Will send out referral for Home Health.    Expected Discharge Plan: Home w Home Health Services Barriers to Discharge: Continued Medical Work up   Patient Goals and CMS Choice Patient states their goals for this hospitalization and ongoing recovery are:: to get homecare services CMS Medicare.gov Compare Post Acute Care list provided to:: Patient Choice offered to / list presented to : Patient      Expected Discharge Plan and Services   Discharge Planning Services: CM Consult Post Acute Care Choice: Home Health Living arrangements for the past 2 months: Apartment                 DME Arranged: N/A         HH Arranged: PT, OT, RN          Prior Living Arrangements/Services Living arrangements for the past 2 months: Apartment Lives with:: Self Patient language and need for interpreter reviewed:: Yes Do you feel safe going back to the place where you live?: Yes      Need for Family Participation in Patient Care: Yes (Comment) Care giver support system in place?: Yes (comment) Current  home services: DME (Power wheelchair, cane, oxygen ) Criminal Activity/Legal Involvement Pertinent to Current Situation/Hospitalization: No - Comment as needed  Activities of Daily Living   ADL Screening (condition at time of admission) Independently performs ADLs?: Yes (appropriate for developmental age) Is the patient deaf or have difficulty hearing?: No Does the patient have difficulty seeing, even when wearing glasses/contacts?: No Does the patient have difficulty concentrating, remembering, or making decisions?: No  Permission Sought/Granted   Permission granted to share information with : Yes, Verbal Permission Granted     Permission granted to share info w AGENCY: Home Health Agency        Emotional Assessment Appearance:: Appears stated age Attitude/Demeanor/Rapport: Guarded Affect (typically observed): Accepting Orientation: : Oriented to Self, Oriented to Place, Oriented to  Time, Oriented to Situation, Fluctuating Orientation (Suspected and/or reported Sundowners) Alcohol  / Substance Use: Not Applicable Psych Involvement: No (comment)  Admission diagnosis:  Malignant neoplasm of lung, unspecified laterality, unspecified part of lung (HCC) [C34.90] Acute on chronic congestive heart failure, unspecified heart failure type (HCC) [I50.9] Acute hypoxic respiratory failure (HCC) [J96.01] Patient Active Problem List   Diagnosis Date Noted   Acute hypoxic respiratory failure (HCC) 07/25/2024   CAP (community acquired pneumonia) 07/09/2024  Chemotherapy induced neutropenia 03/21/2024   Small cell lung cancer, left upper lobe (HCC) 02/21/2024   Acute on chronic heart failure with preserved ejection fraction (HFpEF) (HCC) 01/15/2024   CHF (congestive heart failure) (HCC) 01/15/2024   Type 2 diabetes mellitus with diabetic mononeuropathy, without long-term current use of insulin  (HCC) 09/07/2023   Overweight with body mass index (BMI) of 26 to 26.9 in adult 04/07/2023   ILD  (interstitial lung disease) (HCC) 06/01/2022   Aortic atherosclerosis 12/10/2021   Hyperlipidemia associated with type 2 diabetes mellitus (HCC) 01/26/2021   GERD (gastroesophageal reflux disease) 01/26/2021   Chronic pancreatitis (HCC) 01/26/2021   Erectile dysfunction 01/10/2020   Anxiety and depression 06/05/2019   Alcohol  use disorder, moderate, in sustained remission (HCC) 04/06/2019   Acute on chronic respiratory failure with hypoxia (HCC) 05/06/2018   AKI (acute kidney injury) 04/03/2017   Anemia 03/09/2016   Insomnia due to mental condition 08/28/2015   Essential hypertension 02/15/2015   Osteoarthritis 08/01/2012   PTSD (post-traumatic stress disorder) 06/12/2009   PCP:  Antonette Angeline ORN, NP Pharmacy:   [facility] [address] [place] [address] Phone: [phone] Fax: [phone]  [facility] - [facility] [address] [place] [address] Phone: [phone] Fax: [phone]  [facility] - [place], [place] - [address] [place] [address] Phone: [phone] Fax: [phone]     Social Drivers of Health (SDOH) Social History: SDOH Screenings   Food Insecurity: No Food Insecurity (07/24/2024)  Housing: Low Risk  (07/24/2024)  Transportation Needs: No Transportation Needs (07/24/2024)  Utilities: Not At Risk (07/24/2024)  Alcohol  Screen: Low Risk  (04/07/2023)  Depression (PHQ2-9): Low Risk  (06/12/2024)  Tobacco Use: Medium Risk (07/24/2024)   SDOH Interventions:     Readmission Risk Interventions    07/26/2024   11:59 AM  Readmission Risk Prevention Plan  Transportation Screening Complete  PCP or Specialist Appt within 3-5 Days Complete  HRI or Home Care Consult Complete  Social Work Consult for Recovery Care Planning/Counseling --  Palliative Care Screening Not Applicable  Medication Review Oceanographer) Complete

## 2024-07-27 DIAGNOSIS — J9621 Acute and chronic respiratory failure with hypoxia: Secondary | ICD-10-CM | POA: Diagnosis not present

## 2024-07-27 LAB — BASIC METABOLIC PANEL WITH GFR
Anion gap: 11 (ref 5–15)
BUN: 29 mg/dL — ABNORMAL HIGH (ref 6–20)
CO2: 23 mmol/L (ref 22–32)
Calcium: 9.3 mg/dL (ref 8.9–10.3)
Chloride: 98 mmol/L (ref 98–111)
Creatinine, Ser: 1.47 mg/dL — ABNORMAL HIGH (ref 0.61–1.24)
GFR, Estimated: 55 mL/min — ABNORMAL LOW (ref >60)
Glucose, Bld: 139 mg/dL — ABNORMAL HIGH (ref 70–99)
Potassium: 4.2 mmol/L (ref 3.5–5.1)
Sodium: 132 mmol/L — ABNORMAL LOW (ref 135–145)

## 2024-07-27 LAB — GLUCOSE, CAPILLARY
Glucose-Capillary: 103 mg/dL — ABNORMAL HIGH (ref 70–99)
Glucose-Capillary: 234 mg/dL — ABNORMAL HIGH (ref 70–99)
Glucose-Capillary: 240 mg/dL — ABNORMAL HIGH (ref 70–99)

## 2024-07-27 MED ADMIN — Amlodipine Besylate Tab 5 MG (Base Equivalent): 5 mg | ORAL | NDC 00904637061

## 2024-07-27 MED FILL — Amlodipine Besylate Tab 5 MG (Base Equivalent): 5.0000 mg | ORAL | Qty: 1 | Status: AC

## 2024-07-27 NOTE — Care Plan (Signed)
 During shift change report, Tele called to report the oxygen  had dropped.  RN checked on patient and he was not wearing his oxygen  in his nose.  It was under his chin.  RN asked him to please put it back in his nose.  Patient reported he forgot he removed it.  Patient replaced his oxygen  and immediately his O2 stats improved.  Report given to his night nurse.  Hydration (humidifying bottle) was placed on the oxygen  because it was at 5L.

## 2024-07-27 NOTE — Discharge Instructions (Signed)
 You have [place] Medicaid Healthy Blue- With this managed Medicaid Plan you may qualify for specialty services such as a Case Manager and Long term Care Services in the Home.  Call Member Services to see if you qualify  Call: [phone]

## 2024-07-27 NOTE — Plan of Care (Signed)

## 2024-07-27 NOTE — TOC Progression Note (Signed)
 Transition of Care [facility]) - Progression Note    Patient Details  Name: Noah Velasquez MRN: [id number] Date of Birth: 09-Sep-1965  [facility]) CM/SW Contact  Nathanael CHRISTELLA Ring, RN Phone Number: 07/27/2024, 9:18 AM  Clinical Narrative:     CM has not been able to find home health services as of yet but reached out to [facility] with [facility] and she will review and see if the insurance is in-network.   He was also interested in personal care services.  With his Managed Medicaid Plan he may qualify for theses services and even a Case Manager- member services telephone number added to the discharge instructions for him to call after discharge.   Expected Discharge Plan: Home w Home Health Services Barriers to Discharge: Continued Medical Work up               Expected Discharge Plan and Services   Discharge Planning Services: CM Consult Post Acute Care Choice: Home Health Living arrangements for the past 2 months: Apartment                 DME Arranged: N/A         HH Arranged: PT, OT, RN           Social Drivers of Health (SDOH) Interventions SDOH Screenings   Food Insecurity: No Food Insecurity (07/24/2024)  Housing: Low Risk (07/24/2024)  Transportation Needs: No Transportation Needs (07/24/2024)  Utilities: Not At Risk (07/24/2024)  Alcohol  Screen: Low Risk (04/07/2023)  Depression (PHQ2-9): Low Risk (06/12/2024)  Tobacco Use: Medium Risk (07/24/2024)    Readmission Risk Interventions    07/26/2024   11:59 AM  Readmission Risk Prevention Plan  Transportation Screening Complete  PCP or Specialist Appt within 3-5 Days Complete  HRI or Home Care Consult Complete  Social Work Consult for Recovery Care Planning/Counseling --  Palliative Care Screening Not Applicable  Medication Review Oceanographer) Complete

## 2024-07-27 NOTE — TOC Progression Note (Signed)
 Transition of Care [facility]) - Progression Note    Patient Details  Name: Jaiceon Collister MRN: [id number] Date of Birth: Jun 30, 1966  [facility]) CM/SW Contact  Nathanael CHRISTELLA Ring, RN Phone Number: 07/27/2024, 9:39 AM  Clinical Narrative:    [facility] has accepted for home health PT and OT.    Expected Discharge Plan: Home w Home Health Services Barriers to Discharge: Continued Medical Work up               Expected Discharge Plan and Services   Discharge Planning Services: CM Consult Post Acute Care Choice: Home Health Living arrangements for the past 2 months: Apartment                 DME Arranged: N/A         HH Arranged: PT, OT, RN           Social Drivers of Health (SDOH) Interventions SDOH Screenings   Food Insecurity: No Food Insecurity (07/24/2024)  Housing: Low Risk (07/24/2024)  Transportation Needs: No Transportation Needs (07/24/2024)  Utilities: Not At Risk (07/24/2024)  Alcohol  Screen: Low Risk (04/07/2023)  Depression (PHQ2-9): Low Risk (06/12/2024)  Tobacco Use: Medium Risk (07/24/2024)    Readmission Risk Interventions    07/26/2024   11:59 AM  Readmission Risk Prevention Plan  Transportation Screening Complete  PCP or Specialist Appt within 3-5 Days Complete  HRI or Home Care Consult Complete  Social Work Consult for Recovery Care Planning/Counseling --  Palliative Care Screening Not Applicable  Medication Review Oceanographer) Complete

## 2024-07-27 NOTE — Progress Notes (Signed)
 Occupational Therapy Treatment Patient Details Name: Noah Velasquez MRN: [id number] DOB: 15-Nov-1965 Today's Date: 07/27/2024   History of present illness Pt is a 58 y/o M admitted on 07/24/24 after presenting with c/o SOB & coughing. PMH: HTN, HLD, DM2, chronic hypoxic respiratory failure 2/2 ILD & COPD, stage IV lung CA, alcohol  use disorder, CHF, seizure, PTSD   OT comments  Pt is supine in bed on arrival. Pleasant and agreeable to OT session focusing on ADL performance and implementation of ECS, pacing and PLB to prevent overexertion and maximize safety. Pt is physically moving well, able to perform most tasks at set up to Min/CGA level. Most limited by low endurance requiring 6L 02 for all activity with desat to 76% with minimal activity requiring increased time and PLB to recover to 90% and above. Placed back on 5L at rest which is well over his baseline requirement of 2L 02. Pt lives alone and will be very limited by current 02 and endurance and anticipate weakness from limited activity at this time. Changed recommendation to STR at this time to maximize his safety and ability to maximize his strength and endurance. Pt returned to bed with all needs in place and will cont to require skilled acute OT services to maximize return to PLOF.       If plan is discharge home, recommend the following:  A little help with walking and/or transfers;A little help with bathing/dressing/bathroom;Help with stairs or ramp for entrance;Assistance with cooking/housework   Equipment Recommendations  None recommended by OT    Recommendations for Other Services      Precautions / Restrictions Precautions Precautions: Fall Precaution/Restrictions Comments: monitor O2 Restrictions Weight Bearing Restrictions Per Provider Order: No       Mobility Bed Mobility Overal bed mobility: Modified Independent                  Transfers Overall transfer level: Needs assistance Equipment used:  None Transfers: Sit to/from Stand Sit to Stand: Supervision           General transfer comment: deferred mobility, session focused on ADLs/endurance, desat to 76% with standing ADLs requireing increased time and PLB to recover to 90% and above on 6L 02     Balance Overall balance assessment: Needs assistance Sitting-balance support: Feet supported Sitting balance-Leahy Scale: Normal     Standing balance support: Bilateral upper extremity supported, During functional activity Standing balance-Leahy Scale: Good                             ADL either performed or assessed with clinical judgement   ADL Overall ADL's : Needs assistance/impaired     Grooming: Wash/dry hands;Wash/dry face;Set up;Sitting   Upper Body Bathing: Supervision/ safety;Sitting;Set up   Lower Body Bathing: Supervison/ safety;Sitting/lateral leans;Sit to/from stand   Upper Body Dressing : Set up;Sitting   Lower Body Dressing: Supervision/safety;Sit to/from stand;Sitting/lateral leans;Minimal assistance Lower Body Dressing Details (indicate cue type and reason): to donn socks, pt able to doff socks assist provided d/t desat with minimal activity                    Extremity/Trunk Assessment              Vision       Perception     Praxis     Communication Communication Communication: No apparent difficulties   Cognition Arousal: Alert Behavior During Therapy: [facility] for tasks assessed/performed  Following commands: Intact        Cueing   Cueing Techniques: Verbal cues  Exercises Other Exercises Other Exercises: Edu on ECS, pacing, modification, and PLB to prevent desat/overexertion.    Shoulder Instructions       General Comments desat to 76% with standing ADLs requiring increased time and PLB to recover to 90% and above on 6L 02, returned to 5L upon OT departure at 90%    Pertinent Vitals/ Pain       Pain  Assessment Pain Assessment: Faces Faces Pain Scale: Hurts a little bit Pain Location: R hip ( chronic) Pain Descriptors / Indicators: Discomfort Pain Intervention(s): Monitored during session, Repositioned  Home Living                                          Prior Functioning/Environment              Frequency  Min 2X/week        Progress Toward Goals  OT Goals(current goals can now be found in the care plan section)  Progress towards OT goals: Progressing toward goals  Acute Rehab OT Goals Patient Stated Goal: improve breathing OT Goal Formulation: With patient Time For Goal Achievement: 08/08/24 Potential to Achieve Goals: Good  Plan      Co-evaluation                 AM-PAC OT 6 Clicks Daily Activity     Outcome Measure   Help from another person eating meals?: None Help from another person taking care of personal grooming?: A Little Help from another person toileting, which includes using toliet, bedpan, or urinal?: A Little Help from another person bathing (including washing, rinsing, drying)?: A Lot Help from another person to put on and taking off regular upper body clothing?: A Little Help from another person to put on and taking off regular lower body clothing?: A Lot 6 Click Score: 17    End of Session Equipment Utilized During Treatment: Rolling walker (2 wheels);Oxygen   OT Visit Diagnosis: Other abnormalities of gait and mobility (R26.89);Muscle weakness (generalized) (M62.81)   Activity Tolerance Patient tolerated treatment well;Treatment limited secondary to medical complications (Comment) (02)   Patient Left in bed;with call bell/phone within reach   Nurse Communication Mobility status        Time: 8347-8280 OT Time Calculation (min): 27 min  Charges: OT General Charges $OT Visit: 1 Visit OT Treatments $Self Care/Home Management : 23-37 mins  Kevork Joyce Chrismon, OTR/L  07/27/2024, 5:37 PM   Mackenzy Grumbine E  Chrismon 07/27/2024, 5:34 PM

## 2024-07-27 NOTE — Progress Notes (Addendum)
 PROGRESS NOTE Noah Velasquez    DOB: 02-12-1966, 58 y.o.  FMW:[id number]    Code Status: Full Code   DOA: 07/24/2024   LOS: 2  Brief hospital course  Gideon Burstein is a 58 y.o. male with a PMH significant for hypertension, hyperlipidemia, type 2 diabetes, chronic hypoxic respiratory failure secondary to ILD and COPD, stage IV lung cancer (diagnosed July 2025) presenting to the ED from clinic given he was noted to be hypoxic.   Initially patient's oxygen  was empty.  He was placed on 2 L he only came up to 80%.  ProBNP significantly elevated compared to 2 weeks ago.  Troponin mildly elevated with repeat pending.  Respiratory panel negative for COVID, flu, RSV.  ABG obtained showed 7.4/39/24.  Chest x-ray concerning showed stable left upper lung consolidation and diffuse interstitial prominence.   12/11 -ongoing treatment for COPD exacerbation, CHF exacerbation. Still significantly symptomatic and hypoxic with only minimal exertion. Worse than baseline.   Assessment & Plan  Principal Problem:   Acute on chronic respiratory failure with hypoxia (HCC) Active Problems:   ILD (interstitial lung disease) (HCC)   Essential hypertension   Hyperlipidemia associated with type 2 diabetes mellitus (HCC)   Type 2 diabetes mellitus with diabetic mononeuropathy, without long-term current use of insulin  (HCC)   Anxiety and depression   Chronic pancreatitis (HCC)   PTSD (post-traumatic stress disorder)   GERD (gastroesophageal reflux disease)   CHF (congestive heart failure) (HCC)   Small cell lung cancer, left upper lobe (HCC)   Acute hypoxic respiratory failure (HCC)  Acute on chronic hypoxic respiratory failure  Acute COPD Acute on chronic HFpEF exacerbation.  - Exam and elevated BNP consistent with concomitant CHF exacerbation.  Baseline 2Lnc, now on 4 L [place] - Oral prednisone  course.  On doxycycline  given patient has prolonged QTc and unable to use azithromycin . - Continue IV lasix  with improvement  in Cr -Monitor daily weights, strict inputs and outputs -Wean oxygen  as able. 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 Melanee oncology   Type 2 diabetes: Holding home medicines. on sensitive SSI  Hyponatremia 132, improving Continue diuresis, Monitor sodium tomorrow  AKI- Cr peaked 2.03 and improved to 1.47 - continue IV diuresis - BMP am   Hypertension: Resume amlodipine  5 mg   GERD: Continue home PPI   Hyperlipidemia: Continue home statin   MDD/GAD/PTSD: Continue home meds   Chronic pancreatitis: Continue pancreatic enzyme replacement.   Deconditioning: PT/OT rec Home PT/OT  Body mass index is 24.73 kg/m.  VTE ppx: heparin  injection 5,000 Units Start: 07/24/24 1400  Diet:     Diet   Diet 2 gram sodium Fluid consistency: Thin   Consultants: None  Subjective 07/27/2024    Reports some improvement in SOB but still having significant symptoms Will continue IV Lasix    Objective  Blood pressure 123/85, pulse 86, temperature 97.8 F (36.6 C), resp. rate 16, height 5' 7.5 (1.715 m), weight 73 kg, SpO2 94%.  Intake/Output Summary (Last 24 hours) at 07/27/2024 1413 Last data filed at 07/27/2024 1300 Gross per 24 hour  Intake 720 ml  Output 3325 ml  Net -2605 ml   Filed Weights   07/25/24 0500 07/26/24 0500 07/27/24 0532  Weight: 73 kg 74 kg 72.7 kg     Physical Exam:  General: awake, alert, NAD Respiratory: normal respiratory effort. Neg wheezing. Conversational dyspnea. Crackles at bilateral bases.  Cardiovascular: extremities well perfused,  quick capillary refill, normal S1/S2, RRR, no JVD, murmurs Gastrointestinal: soft, NT, ND Nervous: A&O x3. no gross focal neurologic deficits, normal speech Extremities: moves all equally, no edema, normal tone Skin: dry, intact, normal temperature, normal color. No rashes, lesions or ulcers on exposed skin Psychiatry: normal mood, congruent  affect  Labs   I have personally reviewed the following labs and imaging studies CBC    Component Value Date/Time   WBC 5.6 07/25/2024 0546   RBC 3.32 (L) 07/25/2024 0546   HGB 10.6 (L) 07/25/2024 0546   HGB 9.3 (L) 06/12/2024 1002   HCT 31.6 (L) 07/25/2024 0546   PLT 296 07/25/2024 0546   PLT 339 06/12/2024 1002   MCV 95.2 07/25/2024 0546   MCH 31.9 07/25/2024 0546   MCHC 33.5 07/25/2024 0546   RDW 13.6 07/25/2024 0546   LYMPHSABS 0.8 07/14/2024 1016   MONOABS 0.5 07/14/2024 1016   EOSABS 0.2 07/14/2024 1016   BASOSABS 0.0 07/14/2024 1016      Latest Ref Rng & Units 07/27/2024    4:52 AM 07/26/2024    9:29 AM 07/25/2024    5:46 AM  BMP  Glucose 70 - 99 mg/dL 860  868  775   BUN 6 - 20 mg/dL 29  24  26    Creatinine 0.61 - 1.24 mg/dL 8.52  8.57  7.96   Sodium 135 - 145 mmol/L 132  130  128   Potassium 3.5 - 5.1 mmol/L 4.2  3.6  4.1   Chloride 98 - 111 mmol/L 98  97  95   CO2 22 - 32 mmol/L 23  23  22    Calcium  8.9 - 10.3 mg/dL 9.3  9.4  9.1     No results found.   Disposition Plan & Communication  Patient status: Inpatient  Admitted From: Home Planned disposition location: Home with Home PT/OT Anticipated discharge date: 12/13 pending respiratory status improvement   Family Communication: none at bedside     Author: Laree Lock, MD [facility] 07/27/2024, 2:13 PM   Available by Epic secure chat 7AM-7PM. If 7PM-7AM, please contact night-coverage.  [facility] contact information found on [email or web address].

## 2024-07-28 ENCOUNTER — Other Ambulatory Visit: Payer: Self-pay

## 2024-07-28 DIAGNOSIS — I5032 Chronic diastolic (congestive) heart failure: Secondary | ICD-10-CM | POA: Diagnosis not present

## 2024-07-28 DIAGNOSIS — J849 Interstitial pulmonary disease, unspecified: Secondary | ICD-10-CM | POA: Diagnosis not present

## 2024-07-28 DIAGNOSIS — E1141 Type 2 diabetes mellitus with diabetic mononeuropathy: Secondary | ICD-10-CM | POA: Diagnosis not present

## 2024-07-28 DIAGNOSIS — J9621 Acute and chronic respiratory failure with hypoxia: Secondary | ICD-10-CM | POA: Diagnosis not present

## 2024-07-28 LAB — BASIC METABOLIC PANEL WITH GFR
Anion gap: 10 (ref 5–15)
BUN: 35 mg/dL — ABNORMAL HIGH (ref 6–20)
CO2: 25 mmol/L (ref 22–32)
Calcium: 9.5 mg/dL (ref 8.9–10.3)
Chloride: 95 mmol/L — ABNORMAL LOW (ref 98–111)
Creatinine, Ser: 1.53 mg/dL — ABNORMAL HIGH (ref 0.61–1.24)
GFR, Estimated: 52 mL/min — ABNORMAL LOW (ref >60)
Glucose, Bld: 163 mg/dL — ABNORMAL HIGH (ref 70–99)
Potassium: 4 mmol/L (ref 3.5–5.1)
Sodium: 130 mmol/L — ABNORMAL LOW (ref 135–145)

## 2024-07-28 LAB — GLUCOSE, CAPILLARY
Glucose-Capillary: 151 mg/dL — ABNORMAL HIGH (ref 70–99)
Glucose-Capillary: 141 mg/dL — ABNORMAL HIGH (ref 70–99)
Glucose-Capillary: 216 mg/dL — ABNORMAL HIGH (ref 70–99)
Glucose-Capillary: 280 mg/dL — ABNORMAL HIGH (ref 70–99)

## 2024-07-28 MED ORDER — FUROSEMIDE 10 MG/ML IJ SOLN
40.0000 mg | Freq: Every day | INTRAMUSCULAR | Status: DC
  Administered 2024-07-29: 40 mg via INTRAVENOUS
  Filled 2024-07-28: qty 4

## 2024-07-28 MED ORDER — NAPHAZOLINE-GLYCERIN 0.012-0.25 % OP SOLN
1.0000 [drp] | Freq: Four times a day (QID) | OPHTHALMIC | Status: DC | PRN
  Administered 2024-07-29: 1 [drp] via OPHTHALMIC
  Filled 2024-07-28: qty 15

## 2024-07-28 MED ADMIN — Amlodipine Besylate Tab 5 MG (Base Equivalent): 5 mg | ORAL | NDC 00904637061

## 2024-07-28 MED ADMIN — Dextromethorphan-Guaifenesin Tab ER 12HR 30-600 MG: 1 | ORAL | NDC 63824005634

## 2024-07-28 MED FILL — Dextromethorphan-Guaifenesin Tab ER 12HR 30-600 MG: 1.0000 | ORAL | Qty: 1 | Status: AC

## 2024-07-28 MED FILL — Dextromethorphan-Guaifenesin Tab ER 12HR 30-600 MG: 1.0000 | ORAL | Qty: 1 | Status: CN

## 2024-07-28 NOTE — Progress Notes (Cosign Needed Addendum)
 Physical Therapy Treatment Patient Details Name: Noah Velasquez MRN: [id number] DOB: July 03, 1966 Today's Date: 07/28/2024   History of Present Illness Pt is a 58 y/o M admitted on 07/24/24 after presenting with c/o SOB & coughing. PMH: HTN, HLD, DM2, chronic hypoxic respiratory failure 2/2 ILD & COPD, stage IV lung CA, alcohol  use disorder, CHF, seizure, PTSD    PT Comments  Pt received resting in bed on 4L O2 with SpO2 at 89-92% at rest. Pt completed modified B LE strengthening exercises, chronic R hip pain requiring assist with full range. SpO2 increasing dropping with LE exercises, transition to EOB, and brief static standing at bedside down to 86% with increased SOB. Unable to safely progress mobility, pt has experienced hypoxic events in the past. Discussed current LOF with evaling PT with decision to change d/c recs to STR once medically stable for d/c. Pt lives alone without support and is currently functionally limited and not safe to return home at current LOF.    If plan is discharge home, recommend the following: A little help with walking and/or transfers;A little help with bathing/dressing/bathroom   Can travel by private vehicle     No  Equipment Recommendations  None recommended by PT (TBD at next level of care)    Recommendations for Other Services       Precautions / Restrictions Precautions Precautions: Fall Recall of Precautions/Restrictions: Intact Precaution/Restrictions Comments: monitor O2 Restrictions Weight Bearing Restrictions Per Provider Order: No Other Position/Activity Restrictions:  (Mets to proximal L Humerus)     Mobility  Bed Mobility Overal bed mobility: Modified Independent             General bed mobility comments:  (No physical assist, however moves slowly and requires time to recover after desating on 4L O2)    Transfers Overall transfer level: Needs assistance Equipment used: None Transfers: Sit to/from Stand Sit to Stand: Contact  guard assist           General transfer comment:  (Pt stood ~30 seconds at EOB on 4L O2 with sats dropping from 93% down to 86% limiting further mobility)    Ambulation/Gait Ambulation/Gait assistance: Supervision       Gait velocity: decreased     General Gait Details: side steps along EOB   Psychologist, Counselling mobility:  (Pt has a motorized w/c currently at Bedside)   Tilt Bed    Modified Rankin (Stroke Patients Only)       Balance Overall balance assessment: Needs assistance Sitting-balance support: Feet supported Sitting balance-Leahy Scale: Normal     Standing balance support: Bilateral upper extremity supported, During functional activity Standing balance-Leahy Scale: Fair Standing balance comment:  (Limited gait due to quick to desat on 4L O2)                            Communication Communication Communication: No apparent difficulties  Cognition Arousal: Alert Behavior During Therapy: WFL for tasks assessed/performed   PT - Cognitive impairments: No apparent impairments                       PT - Cognition Comments:  (Pleasant, flat affect) Following commands: Intact      Cueing Cueing Techniques: Verbal cues  Exercises General Exercises - Lower Extremity Ankle Circles/Pumps: AROM, Both, 10 reps Heel Slides: AAROM, Right, 5 reps, AROM, Left,  10 reps, Supine Other Exercises Other Exercises: Edu on ECS, pacing, modification, and PLB to prevent desat/overexertion.    General Comments General comments (skin integrity, edema, etc.): Pt remains on high dose of O2 compared to baseline 2L O2 and desats with minimal activity      Pertinent Vitals/Pain Pain Assessment Pain Assessment: Faces Faces Pain Scale: Hurts a little bit Pain Location: R hip ( chronic) Pain Descriptors / Indicators: Discomfort Pain Intervention(s): Limited activity within patient's tolerance     Home Living                          Prior Function            PT Goals (current goals can now be found in the care plan section) Acute Rehab PT Goals Patient Stated Goal:  (Improve breathing)    Frequency    Min 2X/week      PT Plan      Co-evaluation              AM-PAC PT 6 Clicks Mobility   Outcome Measure  Help needed turning from your back to your side while in a flat bed without using bedrails?: A Little Help needed moving from lying on your back to sitting on the side of a flat bed without using bedrails?: A Little Help needed moving to and from a bed to a chair (including a wheelchair)?: A Little Help needed standing up from a chair using your arms (e.g., wheelchair or bedside chair)?: A Little Help needed to walk in hospital room?: A Little Help needed climbing 3-5 steps with a railing? : A Lot 6 Click Score: 17    End of Session Equipment Utilized During Treatment: Oxygen  Activity Tolerance: Treatment limited secondary to medical complications (Comment) (2/2 O2 needs, desating) Patient left: in bed;with call bell/phone within reach Nurse Communication: Mobility status PT Visit Diagnosis: Other abnormalities of gait and mobility (R26.89);Muscle weakness (generalized) (M62.81);Unsteadiness on feet (R26.81);Difficulty in walking, not elsewhere classified (R26.2)     Time: 8494-8475 PT Time Calculation (min) (ACUTE ONLY): 19 min  Charges:    $Therapeutic Activity: 8-22 mins PT General Charges $$ ACUTE PT VISIT: 1 Visit                    Darice Bohr, PTA  Darice JAYSON Bohr 07/28/2024, 3:59 PM

## 2024-07-28 NOTE — Plan of Care (Signed)
   Problem: Clinical Measurements: Goal: Diagnostic test results will improve Outcome: Progressing   Problem: Activity: Goal: Risk for activity intolerance will decrease Outcome: Progressing   Problem: Nutrition: Goal: Adequate nutrition will be maintained Outcome: Progressing   Problem: Pain Managment: Goal: General experience of comfort will improve and/or be controlled Outcome: Progressing   Problem: Safety: Goal: Ability to remain free from injury will improve Outcome: Progressing

## 2024-07-28 NOTE — Plan of Care (Signed)

## 2024-07-28 NOTE — Progress Notes (Signed)
 [facility]  - [place] at [facility]   PATIENT NAME: Noah Velasquez    MR#:  [id number]  DATE OF BIRTH:  [birth year]  SUBJECTIVE:  laying in bed. Good UOP. Sats stable on 2liter(chronic) pt still complaints not feeling well when discussed d/c option in 1-2 days    VITALS:  Blood pressure 101/69, pulse 80, temperature 97.8 F (36.6 C), resp. rate 18, height 5' 7.5 (1.715 m), weight 72.8 kg, SpO2 93%.  PHYSICAL EXAMINATION:   GENERAL:  58 y.o.-year-old patient with no acute distress. Chronically ill LUNGS: decreased breath sounds bilaterally, no wheezing no respiratory distress CARDIOVASCULAR: S1, S2 normal. No murmur   ABDOMEN: Soft, nontender, nondistended. Bowel sounds present.  EXTREMITIES: No  edema b/l.    NEUROLOGIC: nonfocal  patient is alert and awake, generalized weakness   LABORATORY PANEL:  CBC Recent Labs  Lab 07/25/24 0546  WBC 5.6  HGB 10.6*  HCT 31.6*  PLT 296    Chemistries  Recent Labs  Lab 07/24/24 1817 07/25/24 0546 07/28/24 0605  NA  --    < > 130*  K  --    < > 4.0  CL  --    < > 95*  CO2  --    < > 25  GLUCOSE  --    < > 163*  BUN  --    < > 35*  CREATININE  --    < > 1.53*  CALCIUM   --    < > 9.5  MG 1.7  --   --    < > = values in this interval not displayed.   Assessment and Plan  Noah Velasquez is a 58 y.o. male with a PMH significant for hypertension, hyperlipidemia, type 2 diabetes, chronic hypoxic respiratory failure secondary to ILD and COPD, stage IV lung cancer (diagnosed July 2025) presenting to the ED from clinic given he was noted to be hypoxic.   Initially patient's oxygen  was empty.  He was placed on 2 L he only came up to 80%.  ProBNP significantly elevated compared to 2 weeks ago.  Troponin mildly elevated with repeat pending.  Respiratory panel negative for COVID, flu, RSV.  ABG obtained showed 7.4/39/24.  Chest x-ray concerning showed stable left upper lung consolidation and diffuse interstitial  prominence.   Acute on chronic hypoxic respiratory failure  Acute COPD Acute on chronic HFpEF exacerbation.  - Exam and elevated BNP consistent with concomitant CHF exacerbation.  Baseline 2Lnc, now on 4 L [place] - Oral prednisone  course.  On doxycycline  given patient has prolonged QTc and unable to use azithromycin . - Continue IV lasix  -- have overwatch creatinine since started trending up -Monitor daily weights, strict inputs and outputs -Wean oxygen  as able. 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 Melanee oncology   Type 2 diabetes: Holding home medicines. on sensitive SSI   Hyponatremia 132, improving Continue diuresis    AKI- Cr peaked 2.03 and improved to 1.47 - continue IV diuresis--change to every day given increasing creat - BMP am   Hypertension: Resume amlodipine  5 mg   GERD: Continue home PPI   Hyperlipidemia: Continue home statin   MDD/GAD/PTSD: Continue home meds   Chronic pancreatitis: Continue pancreatic enzyme replacement.   Deconditioning: PT/OT rec Home PT/OT   Body mass index is 24.73 kg/m.   Family communication :none Consults : CODE STATUS: full DVT Prophylaxis :heparin  injection 5,000 Units  Start: 07/24/24 1400 Level of care: Telemetry Status is: Inpatient Remains inpatient appropriate because: CHF/COPD    TOTAL TIME TAKING CARE OF THIS PATIENT: 35 minutes.  >50% time spent on counselling and coordination of care  Note: This dictation was prepared with Dragon dictation along with smaller phrase technology. Any transcriptional errors that result from this process are unintentional.  Leita Blanch M.D    [facility]   CC: Primary care physician; Antonette Angeline ORN, NP

## 2024-07-29 ENCOUNTER — Encounter: Payer: Self-pay | Admitting: Oncology

## 2024-07-29 ENCOUNTER — Other Ambulatory Visit: Payer: Self-pay

## 2024-07-29 DIAGNOSIS — J9621 Acute and chronic respiratory failure with hypoxia: Secondary | ICD-10-CM | POA: Diagnosis not present

## 2024-07-29 DIAGNOSIS — E1141 Type 2 diabetes mellitus with diabetic mononeuropathy: Secondary | ICD-10-CM | POA: Diagnosis not present

## 2024-07-29 DIAGNOSIS — J849 Interstitial pulmonary disease, unspecified: Secondary | ICD-10-CM | POA: Diagnosis not present

## 2024-07-29 DIAGNOSIS — I5032 Chronic diastolic (congestive) heart failure: Secondary | ICD-10-CM | POA: Diagnosis not present

## 2024-07-29 LAB — GLUCOSE, CAPILLARY
Glucose-Capillary: 111 mg/dL — ABNORMAL HIGH (ref 70–99)
Glucose-Capillary: 110 mg/dL — ABNORMAL HIGH (ref 70–99)
Glucose-Capillary: 215 mg/dL — ABNORMAL HIGH (ref 70–99)

## 2024-07-29 LAB — CULTURE, BLOOD (ROUTINE X 2)
Culture: NO GROWTH
Culture: NO GROWTH
Special Requests: ADEQUATE
Special Requests: ADEQUATE

## 2024-07-29 MED ORDER — EMPAGLIFLOZIN 10 MG PO TABS
10.0000 mg | ORAL_TABLET | Freq: Every day | ORAL | 2 refills | Status: AC
  Filled 2024-07-29: qty 90, 90d supply, fill #0

## 2024-07-29 MED ORDER — FUROSEMIDE 20 MG PO TABS
20.0000 mg | ORAL_TABLET | ORAL | 0 refills | Status: DC
  Filled 2024-07-29: qty 30, 60d supply, fill #0

## 2024-07-29 MED ORDER — PRAVASTATIN SODIUM 20 MG PO TABS
20.0000 mg | ORAL_TABLET | Freq: Every day | ORAL | 2 refills | Status: AC
  Filled 2024-07-29: qty 90, 90d supply, fill #0

## 2024-07-29 MED ORDER — ASPIRIN 81 MG PO TBEC
81.0000 mg | DELAYED_RELEASE_TABLET | Freq: Every day | ORAL | 3 refills | Status: AC
  Filled 2024-07-29: qty 30, 30d supply, fill #0

## 2024-07-29 MED ADMIN — Dextromethorphan-Guaifenesin Tab ER 12HR 30-600 MG: 1 | ORAL | NDC 63824005634

## 2024-07-29 MED ADMIN — Dextromethorphan-Guaifenesin Tab ER 12HR 30-600 MG: 1 | ORAL | NDC 63824005632

## 2024-07-29 MED ADMIN — Amlodipine Besylate Tab 5 MG (Base Equivalent): 5 mg | ORAL | NDC 00904637061

## 2024-07-29 NOTE — Discharge Summary (Signed)
 Physician Discharge Summary   Patient: Noah Velasquez MRN: [id number] DOB: 1966-07-25  Admit date:     07/24/2024  Discharge date: 07/29/2024  Discharge Physician: Leita Blanch   PCP: Antonette Angeline ORN, NP   Recommendations at discharge:   follow-up [facility] heart failure clinic on your next appointment. If you don't call and make one follow-up PCP in 1 to 2 week hold your amlodipine /losartan  till labs are checked as outpatient and discussed with PCP before resuming monitor your blood pressure at home and keep log of  Discharge Diagnoses: Principal Problem:   Acute on chronic respiratory failure with hypoxia (HCC) Active Problems:   ILD (interstitial lung disease) (HCC)   Essential hypertension   Hyperlipidemia associated with type 2 diabetes mellitus (HCC)   Type 2 diabetes mellitus with diabetic mononeuropathy, without long-term current use of insulin  (HCC)   Anxiety and depression   Chronic pancreatitis (HCC)   PTSD (post-traumatic stress disorder)   GERD (gastroesophageal reflux disease)   CHF (congestive heart failure) (HCC)   Small cell lung cancer, left upper lobe (HCC)   Acute hypoxic respiratory failure (HCC)  Noah Velasquez is a 58 y.o. male with a PMH significant for hypertension, hyperlipidemia, type 2 diabetes, chronic hypoxic respiratory failure secondary to ILD and COPD, stage IV lung cancer (diagnosed July 2025) presenting to the ED from clinic given he was noted to be hypoxic.   Initially patient's oxygen  was empty.  He was placed on 2 L he only came up to 80%.  ProBNP significantly elevated compared to 2 weeks ago.  Troponin mildly elevated with repeat pending.  Respiratory panel negative for COVID, flu, RSV.  ABG obtained showed 7.4/39/24.  Chest x-ray concerning showed stable left upper lung consolidation and diffuse interstitial prominence.    Acute on chronic hypoxic respiratory failure  Acute on chronic COPD/chronic home oxygen  Acute on chronic HFpEF exacerbation.   - Exam and elevated BNP consistent with concomitant CHF exacerbation.  Baseline 2Lnc, now on 4 L [place] - Oral prednisone  course.  On doxycycline  given patient has prolonged QTc and unable to use azithromycin . - Continue IV lasix  -- have overwatch creatinine since started trending up -Wean oxygen  as able. -BCx NGTD --UOP about 9.5 L since admission --patient clinically improving. Creatinine trending up will hold off IV diuresis and switch to Lasix  20 mg every other day for now. 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 Melanee oncology   Type 2 diabetes: --resumed farxiga --keep log of sugars at home   Hyponatremia 132, improving   AKI- Cr peaked 2.03 and improved to 1.47--1.5 -  IV diuresis--change to every day given increasing creat   Hypertension:  BP intermittently soft and with creat 1.5--holding Amlodipine /olmesrtan -- patient advised to keep log of BP at home discussed with PCP before resume   GERD: Continue home PPI   Hyperlipidemia: Continue home statin   MDD/GAD/PTSD: Continue home meds   Chronic pancreatitis: Continue pancreatic enzyme replacement.   Deconditioning: PT/OT rec Home PT/OT  Overall hemodynamically stable. Will discharge to home with home health. Patient agreeable      Pain control - [place]  Controlled Substance Reporting System database was reviewed. and patient was instructed, not to drive, operate heavy machinery, perform activities at heights, swimming or participation in water  activities or provide baby-sitting services while on Pain, Sleep and Anxiety Medications; until their outpatient Physician has advised  to do so again. Also recommended to not to take more than prescribed Pain, Sleep and Anxiety Medications.  Consultants: none Procedures performed: none  Disposition: Home health Diet recommendation:  Cardiac and Carb modified  diet DISCHARGE MEDICATION: Allergies as of 07/29/2024       Reactions   Hctz [hydrochlorothiazide] Swelling   Ibuprofen Swelling        Medication List     PAUSE taking these medications    amLODipine -olmesartan  10-40 MG tablet Wait to take this until your doctor or other care provider tells you to start again. By your PCP Need creat to be checked before resuming it Commonly known as: AZOR  Take 1 tablet by mouth daily.       STOP taking these medications    metFORMIN  500 MG tablet Commonly known as: GLUCOPHAGE    traZODone  150 MG tablet Commonly known as: DESYREL        TAKE these medications    Accu-Chek Softclix Lancets lancets Use with blood glucose monitor in the morning, at noon, and at bedtime. May substitute to any manufacturer covered by patient's insurance.   aspirin  EC 81 MG tablet Take 1 tablet (81 mg total) by mouth daily. Swallow whole.   Blood Glucose Monitor System w/Device Kit Check blood sugar in the morning, at noon, and at bedtime. May substitute to any manufacturer covered by patient's insurance.   Blood Pressure Kit 1 each by Does not apply route 2 (two) times daily.   Compressor/Nebulizer Misc Nebulizer and tubes/mask to use as directed with DuoNeb as needed   empagliflozin  10 MG Tabs tablet Commonly known as: Jardiance  Take 1 tablet (10 mg total) by mouth daily before breakfast.   furosemide  20 MG tablet Commonly known as: Lasix  Take 1 tablet (20 mg total) by mouth every other day. What changed:  when to take this reasons to take this   ipratropium-albuterol  0.5-2.5 (3) MG/3ML Soln Commonly known as: DUONEB Take 3 mLs by nebulization every 2 (two) hours as needed (wheeze, SOB). Can use instead of albuterol  inhaler   omeprazole  20 MG capsule Commonly known as: PRILOSEC Take 1 capsule (20 mg total) by mouth daily.   OXYGEN  Inhale 2 L into the lungs as needed.   pravastatin  20 MG tablet Commonly known as: PRAVACHOL  Take 1  tablet (20 mg total) by mouth daily at 6 PM.   sertraline  100 MG tablet Commonly known as: ZOLOFT  TAKE 1 TABLET BY MOUTH ONCE DAILY   spironolactone  25 MG tablet Commonly known as: ALDACTONE  Take 1 tablet (25 mg total) by mouth daily.   Ventolin  HFA 108 (90 Base) MCG/ACT inhaler Generic drug: albuterol  Inhale 1-2 puffs into the lungs every 6 (six) hours as needed for wheezing or shortness of breath (or coughing).   Zenpep  15000-47000 units Cpep Generic drug: Pancrelipase  (Lip-Prot-Amyl) Take 1 capsule (15,000 Units total) by mouth 3 (three) times daily.        Contact information for follow-up providers     [facility]. Go on 08/16/2024.   Specialty: Cardiology Why: Hospital Follow-Up 08/16/24 @ 3:30 PM Please bring all medications to follow-up appointment [facility], [address] Floor Free Valet Parking at the door Contact information: [address] [place]  [address] [phone]        Antonette Angeline ORN, NP Follow up.   Specialties: Internal Medicine, Emergency Medicine Why: hospital follow up Contact information: [address] [place] [address] [phone]  [facility]. Schedule an appointment as soon as possible for a visit in 1 week(s).   Specialty: Cardiology Contact information: [address] [place]  [address] [phone]             Contact information for after-discharge care     Home Medical Care     [facility]) .   Service: Home Health Services Contact information: [address] [place]  [address] [phone]                    Discharge Exam: Fredricka Weights   07/27/24 0532 07/28/24 0500 07/29/24 0500  Weight: 72.7 kg 72.8 kg 74.5 kg   GENERAL:  58 y.o.-year-old patient with no acute distress.  Chronically ill LUNGS: decreased breath sounds bilaterally, no wheezing no respiratory distress, few right LL crackles + CARDIOVASCULAR: S1, S2 normal. No murmur   ABDOMEN: Soft, nontender, nondistended. Bowel sounds present.  EXTREMITIES: No  edema b/l.    NEUROLOGIC: nonfocal  patient is alert and awake, generalized weakness  Condition at discharge: fair  The results of significant diagnostics from this hospitalization (including imaging, microbiology, ancillary and laboratory) are listed below for reference.   Imaging Studies: DG Chest 2 View Result Date: 07/24/2024 EXAM: 2 VIEW(S) XRAY OF THE CHEST 07/24/2024 12:01:00 PM COMPARISON: 07/09/2024 CLINICAL HISTORY: sob FINDINGS: LINES, TUBES AND DEVICES: Right chest wall Port-A-Cath in place. LUNGS AND PLEURA: Diffuse interstitial prominence. Stable left upper lung patchy consolidation/mass. No pleural effusion. No pneumothorax. HEART AND MEDIASTINUM: Aortic calcification. No acute abnormality of the cardiac and mediastinal silhouettes. BONES AND SOFT TISSUES: No acute osseous abnormality. IMPRESSION: 1. Stable left upper lung patchy consolidation/mass. 2. Diffuse interstitial prominence. Electronically signed by: Evalene Coho MD 07/24/2024 12:50 PM EST RP Workstation: [id number]   ECHOCARDIOGRAM COMPLETE Result Date: 07/11/2024    ECHOCARDIOGRAM REPORT   Patient Name:   JAKELL TRUSTY Date of Exam: 07/11/2024 Medical Rec #:  [id number]     Height:       67.0 in Accession #:    [id number]    Weight:       158.5 lb Date of Birth:  September 14, 1965    BSA:          1.832 m Patient Age:    58 years      BP:           115/77 mmHg Patient Gender: M             HR:           76 bpm. Exam Location:  [facility] Procedure: 2D Echo, Cardiac Doppler and Color Doppler (Both Spectral and Color            Flow Doppler were utilized during procedure). Indications:     Abnormal ECG R94.31  History:         Patient has prior history of Echocardiogram examinations, most                   recent 01/15/2024. CHF, COPD, Signs/Symptoms:Dyspnea; Risk                  Factors:Diabetes.  Sonographer:     Christopher Furnace Referring Phys:  [id number] AMRIT ADHIKARI Diagnosing Phys: Sabina Custovic IMPRESSIONS  1. Left ventricular ejection fraction, by estimation, is 60 to 65%. The left ventricle has normal function. The left ventricle has no regional wall motion abnormalities.  Left ventricular diastolic parameters are consistent with Grade I diastolic dysfunction (impaired relaxation).  2. Right ventricular systolic function is normal. The right ventricular size is normal.  3. Left atrial size was mildly dilated.  4. The mitral valve is normal in structure. Trivial mitral valve regurgitation. No evidence of mitral stenosis.  5. Tricuspid valve regurgitation is severe.  6. The aortic valve is normal in structure. Aortic valve regurgitation is mild. No aortic stenosis is present.  7. The inferior vena cava is normal in size with greater than 50% respiratory variability, suggesting right atrial pressure of 3 mmHg. FINDINGS  Left Ventricle: Left ventricular ejection fraction, by estimation, is 60 to 65%. The left ventricle has normal function. The left ventricle has no regional wall motion abnormalities. The left ventricular internal cavity size was normal in size. There is  no left ventricular hypertrophy. Left ventricular diastolic parameters are consistent with Grade I diastolic dysfunction (impaired relaxation). Right Ventricle: The right ventricular size is normal. No increase in right ventricular wall thickness. Right ventricular systolic function is normal. Left Atrium: Left atrial size was mildly dilated. Right Atrium: Right atrial size was normal in size. Pericardium: There is no evidence of pericardial effusion. Mitral Valve: The mitral valve is normal in structure. Trivial mitral valve regurgitation. No evidence of mitral valve stenosis. MV peak gradient, 2.7 mmHg. The mean mitral valve gradient is  2.0 mmHg. Tricuspid Valve: The tricuspid valve is normal in structure. Tricuspid valve regurgitation is severe. Aortic Valve: The aortic valve is normal in structure. Aortic valve regurgitation is mild. No aortic stenosis is present. Aortic valve mean gradient measures 3.0 mmHg. Aortic valve peak gradient measures 4.8 mmHg. Aortic valve area, by VTI measures 8.19 cm. Pulmonic Valve: The pulmonic valve was normal in structure. Pulmonic valve regurgitation is not visualized. Aorta: The aortic root is normal in size and structure. Venous: The inferior vena cava is normal in size with greater than 50% respiratory variability, suggesting right atrial pressure of 3 mmHg. IAS/Shunts: No atrial level shunt detected by color flow Doppler.  LEFT VENTRICLE PLAX 2D LVIDd:         4.90 cm   Diastology LVIDs:         3.50 cm   LV e' medial:    10.60 cm/s LV PW:         1.20 cm   LV E/e' medial:  6.5 LV IVS:        1.30 cm   LV e' lateral:   11.10 cm/s LVOT diam:     3.35 cm   LV E/e' lateral: 6.2 LV SV:         115 LV SV Index:   63 LVOT Area:     8.81 cm LV IVRT:       100 msec  RIGHT VENTRICLE RV Basal diam:  3.90 cm     PULMONARY VEINS RV Mid diam:    3.50 cm     Diastolic Velocity: 33.00 cm/s RV S prime:     11.00 cm/s  S/D Velocity:       0.60                             Systolic Velocity:  19.70 cm/s LEFT ATRIUM             Index        RIGHT ATRIUM           Index LA diam:  4.40 cm 2.40 cm/m   RA Area:     17.60 cm LA Vol (A2C):   86.4 ml 47.16 ml/m  RA Volume:   45.10 ml  24.62 ml/m LA Vol (A4C):   24.7 ml 13.48 ml/m LA Biplane Vol: 50.6 ml 27.62 ml/m  AORTIC VALVE AV Area (Vmax):    5.62 cm AV Area (Vmean):   4.87 cm AV Area (VTI):     8.19 cm AV Vmax:           109.00 cm/s AV Vmean:          77.400 cm/s AV VTI:            0.141 m AV Peak Grad:      4.8 mmHg AV Mean Grad:      3.0 mmHg LVOT Vmax:         69.50 cm/s LVOT Vmean:        42.800 cm/s LVOT VTI:          0.131 m LVOT/AV VTI ratio: 0.93  AORTA  Ao Root diam: 3.90 cm MITRAL VALVE               TRICUSPID VALVE MV Area (PHT): 4.77 cm    TR Peak grad:   55.1 mmHg MV Area VTI:   7.58 cm    TR Vmax:        371.00 cm/s MV Peak grad:  2.7 mmHg MV Mean grad:  2.0 mmHg    SHUNTS MV Vmax:       0.81 m/s    Systemic VTI:  0.13 m MV Vmean:      58.9 cm/s   Systemic Diam: 3.35 cm MV Decel Time: 159 msec MV E velocity: 68.70 cm/s MV A velocity: 94.90 cm/s MV E/A ratio:  0.72 Sabina Custovic Electronically signed by Annalee Casa Signature Date/Time: 07/11/2024/3:09:43 PM    Final    US  RENAL Result Date: 07/10/2024 EXAM: US  Retroperitoneum Complete, Renal. 07/10/2024 03:46:22 PM TECHNIQUE: Real-time ultrasonography of the retroperitoneum renal was performed. COMPARISON: None available CLINICAL HISTORY: Acute kidney disease. FINDINGS: FINDINGS: RIGHT KIDNEY/URETER: Right kidney measures 9.7 x 5.8 x 5.9 cm (174 cc). Mild scattered scarring leading to a lobulated contour as shown on prior exams. Normal cortical echogenicity. No hydronephrosis. No calculus. No mass. LEFT KIDNEY/URETER: Left kidney measures 10.1 x 6.4 x 5.2 cm (175 cc). Mild scattered scarring leading to a lobulated contour as shown on prior exams. Normal cortical echogenicity. No hydronephrosis. No calculus. No mass. BLADDER: Unremarkable appearance of the bladder. IMPRESSION: 1. Mild scattered bilateral renal cortical scarring with lobulated contour, unchanged from prior exams. Electronically signed by: Ryan Salvage MD 07/10/2024 09:26 PM EST RP Workstation: [id number]   CT Angio Chest Pulmonary Embolism (PE) W or WO Contrast Result Date: 07/09/2024 EXAM: CTA of the Chest with contrast for PE 07/09/2024 10:53:27 PM TECHNIQUE: CTA of the chest was performed after the administration of intravenous contrast. 75 ml omnipaque  350 Multiplanar reformatted images are provided for review. MIP images are provided for review. Automated exposure control, iterative reconstruction, and/or weight based  adjustment of the mA/kV was utilized to reduce the radiation dose to as low as reasonably achievable. COMPARISON: 01/16/2024, 01/25/2024 PET CT. CLINICAL HISTORY: Chest pain and positive D-dimer. History of left upper lobe lung carcinoma. FINDINGS: PULMONARY ARTERIES: The pulmonary artery shows no evidence of pulmonary embolism. Main pulmonary artery is normal in caliber. MEDIASTINUM: The heart is not significantly enlarged in size. Coronary calcifications are noted. The aorta shows  atherosclerotic calcifications. No aneurysmal dilatation or dissection is seen. The esophagus, as visualized, is within normal limits. LYMPH NODES: No mediastinal, hilar or axillary lymphadenopathy. LUNGS AND PLEURA: The lungs demonstrate diffuse emphysematous changes similar to those seen on prior exams. The previously seen left upper lobe lung mass again shows significant regression similar to that seen on prior PET CT from 1 month previous. Diffuse parenchymal opacity is noted, however, in the left upper lobe likely representing acute on chronic infiltrate. Similar findings are noted in the superior segment of the left lower lobe. Interstitial edema is seen. No new focal mass is noted. No sizable effusion is seen. No pneumothorax. UPPER ABDOMEN: Visualized upper abdomen shows diffuse calcifications throughout the pancreas consistent with chronic pancreatitis. SOFT TISSUES AND BONES: Right chest wall port is seen. Osseous structures are within normal limits. No acute soft tissue abnormality. IMPRESSION: 1. No evidence of pulmonary embolism. 2. Diffuse parenchymal opacity in the left upper lobe, likely representing acute on chronic infiltrate, with similar findings in the superior segment of the left lower lobe. No new focal mass. 3. Nearly complete resolution of the previously seen left upper lobe lung mass, stable from prior imaging. 4. Diffuse emphysematous changes in the lungs, similar to prior exams. Electronically signed by: Oneil Devonshire MD 07/09/2024 11:06 PM EST RP Workstation: [id number]   DG Chest Portable 1 View Result Date: 07/09/2024 CLINICAL DATA:  SOB, hx ILD, CHF, LUL cancer EXAM: PORTABLE CHEST 1 VIEW COMPARISON:  January 24, 2024 FINDINGS: The cardiomediastinal silhouette is unchanged in contour.RIGHT chest port with tip terminating over the superior cavoatrial junction. No pleural effusion. No pneumothorax. Interval increase in a basilar predominant interstitial prominence. Known LEFT upper lobe malignancy is not well visualized radiographically and is favored decreased in size. IMPRESSION: Interval increase in a basilar predominant interstitial prominence. Differential considerations include pulmonary edema versus atypical infection superimposed on a background of interstitial lung disease. Electronically Signed   By: Corean Salter M.D.   On: 07/09/2024 17:58    Microbiology: Results for orders placed or performed during the hospital encounter of 07/24/24  Resp panel by RT-PCR (RSV, Flu A&B, Covid) Anterior Nasal Swab     Status: None   Collection Time: 07/24/24 11:40 AM   Specimen: Anterior Nasal Swab  Result Value Ref Range Status   SARS Coronavirus 2 by RT PCR NEGATIVE NEGATIVE Final    Comment: (NOTE) SARS-CoV-2 target nucleic acids are NOT DETECTED.  The SARS-CoV-2 RNA is generally detectable in upper respiratory specimens during the acute phase of infection. The lowest concentration of SARS-CoV-2 viral copies this assay can detect is 138 copies/mL. A negative result does not preclude SARS-Cov-2 infection and should not be used as the sole basis for treatment or other patient management decisions. A negative result may occur with  improper specimen collection/handling, submission of specimen other than nasopharyngeal swab, presence of viral mutation(s) within the areas targeted by this assay, and inadequate number of viral copies(<138 copies/mL). A negative result must be combined with clinical  observations, patient history, and epidemiological information. The expected result is Negative.  Fact Sheet for Patients:  bloggercourse.com  Fact Sheet for Healthcare Providers:  seriousbroker.it  This test is no t yet approved or cleared by the [place]  FDA and  has been authorized for detection and/or diagnosis of SARS-CoV-2 by FDA under an Emergency Use Authorization (EUA). This EUA will remain  in effect (meaning this test can be used) for the duration of the COVID-19 declaration under Section  564(b)(1) of the Act, 21 U.S.C.section 360bbb-3(b)(1), unless the authorization is terminated  or revoked sooner.       Influenza A by PCR NEGATIVE NEGATIVE Final   Influenza B by PCR NEGATIVE NEGATIVE Final    Comment: (NOTE) The Xpert Xpress SARS-CoV-2/FLU/RSV plus assay is intended as an aid in the diagnosis of influenza from Nasopharyngeal swab specimens and should not be used as a sole basis for treatment. Nasal washings and aspirates are unacceptable for Xpert Xpress SARS-CoV-2/FLU/RSV testing.  Fact Sheet for Patients: bloggercourse.com  Fact Sheet for Healthcare Providers: seriousbroker.it  This test is not yet approved or cleared by the [place]  FDA and has been authorized for detection and/or diagnosis of SARS-CoV-2 by FDA under an Emergency Use Authorization (EUA). This EUA will remain in effect (meaning this test can be used) for the duration of the COVID-19 declaration under Section 564(b)(1) of the Act, 21 U.S.C. section 360bbb-3(b)(1), unless the authorization is terminated or revoked.     Resp Syncytial Virus by PCR NEGATIVE NEGATIVE Final    Comment: (NOTE) Fact Sheet for Patients: bloggercourse.com  Fact Sheet for Healthcare Providers: seriousbroker.it  This test is not yet approved or cleared by  the [place]  FDA and has been authorized for detection and/or diagnosis of SARS-CoV-2 by FDA under an Emergency Use Authorization (EUA). This EUA will remain in effect (meaning this test can be used) for the duration of the COVID-19 declaration under Section 564(b)(1) of the Act, 21 U.S.C. section 360bbb-3(b)(1), unless the authorization is terminated or revoked.  Performed at [facility], [address] [place], [place] [address]   Culture, blood (Routine X 2) w Reflex to ID Panel     Status: None   Collection Time: 07/24/24  6:17 PM   Specimen: BLOOD  Result Value Ref Range Status   Specimen Description BLOOD BLOOD LEFT ARM  Final   Special Requests   Final    BOTTLES DRAWN AEROBIC AND ANAEROBIC Blood Culture adequate volume   Culture   Final    NO GROWTH 5 DAYS Performed at [facility], [address] [place], [place] [address]    Report Status 07/29/2024 FINAL  Final  Culture, blood (Routine X 2) w Reflex to ID Panel     Status: None   Collection Time: 07/24/24  6:17 PM   Specimen: BLOOD  Result Value Ref Range Status   Specimen Description BLOOD BLOOD LEFT HAND  Final   Special Requests   Final    BOTTLES DRAWN AEROBIC AND ANAEROBIC Blood Culture adequate volume   Culture   Final    NO GROWTH 5 DAYS Performed at [facility], [address] [place], [place] [address]    Report Status 07/29/2024 FINAL  Final    Labs: CBC: Recent Labs  Lab 07/24/24 1140 07/25/24 0546  WBC 6.8 5.6  HGB 10.8* 10.6*  HCT 33.5* 31.6*  MCV 98.0 95.2  PLT 285 296   Basic Metabolic Panel: Recent Labs  Lab 07/24/24 1140 07/24/24 1817 07/25/24 0546 07/26/24 0929 07/27/24 0452 07/28/24 0605  NA 135  --  128* 130* 132* 130*  K 4.0  --  4.1 3.6 4.2 4.0  CL 101  --  95* 97* 98 95*  CO2 22  --  22 23 23 25   GLUCOSE 162*  --  224* 131* 139* 163*  BUN 12  --  26* 24* 29* 35*  CREATININE 1.79*  --  2.03* 1.42* 1.47* 1.53*  CALCIUM   9.1  --  9.1  9.4 9.3 9.5  MG  --  1.7  --   --   --   --     Discharge time spent: greater than 30 minutes.  Signed: Leita Blanch, MD [facility] 07/29/2024

## 2024-07-29 NOTE — TOC Transition Note (Signed)
 Transition of Care [facility]) - Discharge Note   Patient Details  Name: Malakai Schoenherr MRN: [id number] Date of Birth: 02-Jul-1966  Transition of Care [facility] [place]) CM/SW Contact:  Lateisha Thurlow L Torin Modica, LCSW Phone Number: 07/29/2024, 12:16 PM   Clinical Narrative:     Patient discharging home with Home Health. Patient is arranging his own transportation. No further TOC needs assessed.   TOC signing OFF.     Barriers to Discharge: Continued Medical Work up   Patient Goals and CMS Choice Patient states their goals for this hospitalization and ongoing recovery are:: to get homecare services CMS Medicare.gov Compare Post Acute Care list provided to:: Patient Choice offered to / list presented to : Patient      Discharge Placement                       Discharge Plan and Services Additional resources added to the After Visit Summary for     Discharge Planning Services: CM Consult Post Acute Care Choice: Home Health          DME Arranged: N/A         HH Arranged: PT, OT, RN          Social Drivers of Health (SDOH) Interventions SDOH Screenings   Food Insecurity: No Food Insecurity (07/24/2024)  Housing: Low Risk (07/24/2024)  Transportation Needs: No Transportation Needs (07/24/2024)  Utilities: Not At Risk (07/24/2024)  Alcohol  Screen: Low Risk (04/07/2023)  Depression (PHQ2-9): Low Risk (06/12/2024)  Tobacco Use: Medium Risk (07/24/2024)     Readmission Risk Interventions    07/26/2024   11:59 AM  Readmission Risk Prevention Plan  Transportation Screening Complete  PCP or Specialist Appt within 3-5 Days Complete  HRI or Home Care Consult Complete  Social Work Consult for Recovery Care Planning/Counseling --  Palliative Care Screening Not Applicable  Medication Review Oceanographer) Complete

## 2024-07-29 NOTE — Plan of Care (Signed)
  Problem: Clinical Measurements: Goal: Respiratory complications will improve Outcome: Progressing   Problem: Nutrition: Goal: Adequate nutrition will be maintained Outcome: Progressing   Problem: Pain Managment: Goal: General experience of comfort will improve and/or be controlled Outcome: Progressing   Problem: Safety: Goal: Ability to remain free from injury will improve Outcome: Progressing   Problem: Skin Integrity: Goal: Risk for impaired skin integrity will decrease Outcome: Progressing

## 2024-07-31 ENCOUNTER — Telehealth: Payer: Self-pay

## 2024-07-31 NOTE — Transitions of Care (Post Inpatient/ED Visit) (Signed)
° °  07/31/2024  Name: Noah Velasquez MRN: [id number] DOB: Apr 08, 1966  Today's TOC FU Call Status: Today's TOC FU Call Status:: Unsuccessful Call (1st Attempt) Unsuccessful Call (1st Attempt) Date: 07/31/24  Attempted to reach the patient regarding the most recent Inpatient/ED visit.  Follow Up Plan: Additional outreach attempts will be made to reach the patient to complete the Transitions of Care (Post Inpatient/ED visit) call.   Medford Balboa, BSN, RN [place]  VBCI - [employer] [facility] RN Care Manager [phone]

## 2024-08-01 ENCOUNTER — Telehealth: Payer: Self-pay

## 2024-08-01 NOTE — Transitions of Care (Post Inpatient/ED Visit) (Signed)
° °  08/01/2024  Name: Noah Velasquez MRN: [id number] DOB: 1966/06/02  Today's TOC FU Call Status: Today's TOC FU Call Status:: Unsuccessful Call (2nd Attempt) Unsuccessful Call (1st Attempt) Date: 07/31/24 Unsuccessful Call (2nd Attempt) Date: 08/01/24  Attempted to reach the patient regarding the most recent Inpatient/ED visit.  Follow Up Plan: Additional outreach attempts will be made to reach the patient to complete the Transitions of Care (Post Inpatient/ED visit) call.   Medford Balboa, BSN, RN [place]  VBCI - [employer] [facility] RN Care Manager [phone]

## 2024-08-02 ENCOUNTER — Telehealth: Payer: Self-pay

## 2024-08-02 NOTE — Transitions of Care (Post Inpatient/ED Visit) (Signed)
° °  08/02/2024  Name: Noah Velasquez MRN: [id number] DOB: 1965/10/07  Today's TOC FU Call Status: Today's TOC FU Call Status:: Unsuccessful Call (3rd Attempt) Unsuccessful Call (1st Attempt) Date: 07/31/24 Unsuccessful Call (2nd Attempt) Date: 08/01/24 Unsuccessful Call (3rd Attempt) Date: 08/02/24  Attempted to reach the patient regarding the most recent Inpatient/ED visit.  Follow Up Plan: No further outreach attempts will be made at this time. We have been unable to contact the patient.  Medford Balboa, BSN, RN [place]  VBCI - [employer] [facility] RN Care Manager [phone]

## 2024-08-03 ENCOUNTER — Telehealth: Payer: Self-pay | Admitting: Pharmacy Technician

## 2024-08-03 NOTE — Telephone Encounter (Signed)
 Attempted to call patient.  Unable to reach.  Left message for patient to return my call.  Noah Velasquez Patient Pharmacologist [facility]

## 2024-08-04 ENCOUNTER — Telehealth: Payer: Self-pay | Admitting: Pharmacy Technician

## 2024-08-04 NOTE — Telephone Encounter (Signed)
 Patient requested Noah Velasquez to be used for [employer].  Patient calling to find out if the check has been cut and mailed.  Checked with [facility] A/[facility] Department.  Check# [id number] issued and mailed on 07/25/24.  Made patient aware.  [name] Patient Pharmacologist [facility]

## 2024-08-07 ENCOUNTER — Inpatient Hospital Stay: Admitting: Oncology

## 2024-08-07 ENCOUNTER — Ambulatory Visit: Admission: RE | Admit: 2024-08-07 | Source: Ambulatory Visit

## 2024-08-07 ENCOUNTER — Encounter: Payer: Self-pay | Admitting: Oncology

## 2024-08-07 ENCOUNTER — Inpatient Hospital Stay

## 2024-08-07 ENCOUNTER — Inpatient Hospital Stay: Attending: Radiation Oncology

## 2024-08-07 ENCOUNTER — Other Ambulatory Visit: Payer: Self-pay

## 2024-08-07 ENCOUNTER — Ambulatory Visit
Admission: RE | Admit: 2024-08-07 | Discharge: 2024-08-07 | Disposition: A | Discharge status: Home / Self Care | Source: Ambulatory Visit | Attending: Oncology | Admitting: Oncology

## 2024-08-07 VITALS — BP 108/75 | HR 93 | Temp 98.6°F | Resp 17 | Wt 155.6 lb

## 2024-08-07 DIAGNOSIS — C3412 Malignant neoplasm of upper lobe, left bronchus or lung: Secondary | ICD-10-CM | POA: Diagnosis not present

## 2024-08-07 DIAGNOSIS — Z79620 Long term (current) use of immunosuppressive biologic: Secondary | ICD-10-CM | POA: Diagnosis not present

## 2024-08-07 DIAGNOSIS — C7931 Secondary malignant neoplasm of brain: Secondary | ICD-10-CM | POA: Diagnosis not present

## 2024-08-07 DIAGNOSIS — R42 Dizziness and giddiness: Secondary | ICD-10-CM | POA: Diagnosis not present

## 2024-08-07 DIAGNOSIS — R410 Disorientation, unspecified: Secondary | ICD-10-CM | POA: Diagnosis not present

## 2024-08-07 DIAGNOSIS — R058 Other specified cough: Secondary | ICD-10-CM | POA: Diagnosis not present

## 2024-08-07 DIAGNOSIS — Z5112 Encounter for antineoplastic immunotherapy: Secondary | ICD-10-CM

## 2024-08-07 LAB — CMP (CANCER CENTER ONLY)
ALT: 9 U/L (ref 0–44)
AST: 26 U/L (ref 15–41)
Albumin: 3.7 g/dL (ref 3.5–5.0)
Alkaline Phosphatase: 82 U/L (ref 38–126)
Anion gap: 17 — ABNORMAL HIGH (ref 5–15)
BUN: 12 mg/dL (ref 6–20)
CO2: 20 mmol/L — ABNORMAL LOW (ref 22–32)
Calcium: 9.5 mg/dL (ref 8.9–10.3)
Chloride: 96 mmol/L — ABNORMAL LOW (ref 98–111)
Creatinine: 1.51 mg/dL — ABNORMAL HIGH (ref 0.61–1.24)
GFR, Estimated: 53 mL/min — ABNORMAL LOW
Glucose, Bld: 152 mg/dL — ABNORMAL HIGH (ref 70–99)
Potassium: 3.5 mmol/L (ref 3.5–5.1)
Sodium: 132 mmol/L — ABNORMAL LOW (ref 135–145)
Total Bilirubin: 0.7 mg/dL (ref 0.0–1.2)
Total Protein: 9.2 g/dL — ABNORMAL HIGH (ref 6.5–8.1)

## 2024-08-07 LAB — CBC WITH DIFFERENTIAL (CANCER CENTER ONLY)
Abs Immature Granulocytes: 0.05 K/uL (ref 0.00–0.07)
Basophils Absolute: 0.1 K/uL (ref 0.0–0.1)
Basophils Relative: 1 %
Eosinophils Absolute: 0.3 K/uL (ref 0.0–0.5)
Eosinophils Relative: 3 %
HCT: 37.6 % — ABNORMAL LOW (ref 39.0–52.0)
Hemoglobin: 12.5 g/dL — ABNORMAL LOW (ref 13.0–17.0)
Immature Granulocytes: 0 %
Lymphocytes Relative: 11 %
Lymphs Abs: 1.2 K/uL (ref 0.7–4.0)
MCH: 31.1 pg (ref 26.0–34.0)
MCHC: 33.2 g/dL (ref 30.0–36.0)
MCV: 93.5 fL (ref 80.0–100.0)
Monocytes Absolute: 0.5 K/uL (ref 0.1–1.0)
Monocytes Relative: 5 %
Neutro Abs: 9.5 K/uL — ABNORMAL HIGH (ref 1.7–7.7)
Neutrophils Relative %: 80 %
Platelet Count: 304 K/uL (ref 150–400)
RBC: 4.02 MIL/uL — ABNORMAL LOW (ref 4.22–5.81)
RDW: 14.4 % (ref 11.5–15.5)
WBC Count: 11.7 K/uL — ABNORMAL HIGH (ref 4.0–10.5)
nRBC: 0 % (ref 0.0–0.2)

## 2024-08-07 LAB — TSH: TSH: 1.2 u[IU]/mL (ref 0.350–4.500)

## 2024-08-07 MED ORDER — AMOXICILLIN-POT CLAVULANATE 875-125 MG PO TABS
1.0000 | ORAL_TABLET | Freq: Two times a day (BID) | ORAL | 0 refills | Status: AC
  Filled 2024-08-07: qty 14, 7d supply, fill #0

## 2024-08-07 MED ORDER — SODIUM CHLORIDE 0.9 % IV SOLN
INTRAVENOUS | Status: DC
  Filled 2024-08-07: qty 250

## 2024-08-07 MED ORDER — SODIUM CHLORIDE 0.9 % IV SOLN
1500.0000 mg | Freq: Once | INTRAVENOUS | Status: AC
  Administered 2024-08-07: 1500 mg via INTRAVENOUS
  Filled 2024-08-07: qty 30

## 2024-08-07 MED ORDER — TRAZODONE HCL 50 MG PO TABS
50.0000 mg | ORAL_TABLET | Freq: Every day | ORAL | 3 refills | Status: AC
  Filled 2024-08-07: qty 30, 30d supply, fill #0

## 2024-08-07 NOTE — Progress Notes (Signed)
 "    Hematology/Oncology Consult note Noah Velasquez  [phone] [phone]  Patient Care Team: Antonette Angeline ORN, NP as PCP - General (Internal Medicine) Verdene Gills, RN as Oncology Nurse Navigator [name], MD as Consulting Physician (Oncology) Rennie Cindy SAUNDERS, MD as Consulting Physician (Oncology)   Name of the patient: Noah Velasquez  [id number]  03-Aug-1966   Date of visit: 08/07/2024  Diagnosis-  Cancer Staging  Small cell lung cancer, left upper lobe [facility]) Staging form: Lung, AJCC V9 - Clinical stage from 02/21/2024: Stage IIIB (cT2, cN2b, cM0) - Signed by [name], MD on 02/21/2024 Method of lymph node assessment: Clinical    Chief complaint/ Reason for visit-on treatment assessment prior to cycle 3 of maintenance durvalumab   Heme/Onc history: Patient is a 58 year old African-American male with a past medical history significant for CHF, interstitial lung disease, type 2 diabetes.  He is mainly wheelchair-bound but lives independently.  He underwent CT chest without contrast when he had a chest x-ray which showed a lung nodule.  CT chest showed a 3.7 x 1.8 cm lobulated mass in the left upper lobe abutting the chest wall concerning for primary bronchogenic carcinoma.  Adjacent satellite nodularity in the left upper lobe.  14 mm right paratracheal node and 14 mm short axis prevascular node.  Moderate centrilobular and paraseptal emphysematous changes.  MRI brain with and without contrast showed remote infarcts and chronic microvascular disease but no evidence of acute intracranial abnormality.  Patient was seen by Dr. Isaiah and underwent bronchoscopy.  Left upper lobe lung biopsy was consistent with poorly differentiated carcinoma consistent with small cell carcinoma.  Tumor was positive for TTF-1 and neuroendocrine markers synaptophysin.  Negative for Napsin A.  Negative for squamous cell marker p40.FNA of the right paratracheal  adenopathy was done and was negative for malignancy but left-sided mediastinal and hilar lymph nodes were not sampled at the time of bronchoscopy.  He uses 2 L of oxygen  mainly at night but not during the day   PET CT scan in July 2025 showed left upper lobe lung mass with an SUV of 15.7 consistent with neoplasm.  Additional small adjacent nodules in the left upper lobe.  Several left-sided upper mediastinal hypermetabolic lymph nodes including an AP window node with an SUV of 13.8 measuring 3 x 2.3 cm.  Another lymph node to the left of the ascending aorta measuring 3.6 x 1.7 cm with an SUV of 15.9.  Small nodes superior to this level at the level of great vessels as well as additional suspicious nodes in the right paratracheal region with an SUV of 4.4.  Area of uptake along the proximal left humeral shaft with an SUV of 9.3 which could be an area of isolated bone metastases.  Low level of uptake in the right inguinal lymph node with an SUV of 3.7.   Patient had MRI of his left humerus which showed a 6 cm mass in the intramedullary region in the diaphysis of mid to proximal left humerus which was concerning for metastatic disease.  Patient is still being treated with the potential curative intent.  He completed concurrent chemoradiation with cisplatin  etoposide  and will be receiving palliative radiation to his left humerus.  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  He reports recent onset  of fever and chills, though he is unable to provide a measured temperature. He also describes a worsening cough productive of yellow and gray sputum, and increased dyspnea compared to his baseline.  He has experienced episodes of transient confusion and disorientation lasting several minutes, typically occurring  during severe coughing. One episode resulted in dizziness and a fall at home, while a second episode occurred without a fall. He describes these events as 'spacing out' and being unaware of his surroundings, with subsequent return to baseline mental status after three to four minutes.  He expresses concern regarding the potential need to resume chemotherapy or brain radiation, noting prior improvement in dyspnea with chemotherapy. He is due for a surveillance CT scan in two weeks.      ECOG PS- 2 Pain scale- 0   Review of systems- Review of Systems  Constitutional:  Negative for chills, fever, malaise/fatigue and weight loss.  HENT:  Negative for congestion, ear discharge and nosebleeds.   Eyes:  Negative for blurred vision.  Respiratory:  Positive for cough, sputum production and shortness of breath. Negative for hemoptysis and wheezing.   Cardiovascular:  Negative for chest pain, palpitations, orthopnea and claudication.  Gastrointestinal:  Negative for abdominal pain, blood in stool, constipation, diarrhea, heartburn, melena, nausea and vomiting.  Genitourinary:  Negative for dysuria, flank pain, frequency, hematuria and urgency.  Musculoskeletal:  Negative for back pain, joint pain and myalgias.  Skin:  Negative for rash.  Neurological:  Negative for dizziness, tingling, focal weakness, seizures, weakness and headaches.  Endo/Heme/Allergies:  Does not bruise/bleed easily.  Psychiatric/Behavioral:  Negative for depression and suicidal ideas. The patient does not have insomnia.       Allergies[1]   Past Medical History:  Diagnosis Date   Alcohol  use disorder    Aortic atherosclerosis    Bronchiolo-alveolar adenocarcinoma of left lung (HCC)    CHF (congestive heart failure) (HCC)    Chronic hip pain    Chronic pancreatitis (HCC)    Cigarette smoker    CKD (chronic kidney disease) stage 3, GFR 30-59 ml/min (HCC)    COPD (chronic obstructive pulmonary disease) (HCC)    Crack  cocaine use    as of 01-21-24, pt states he last used a few weeks ago   Depression    DM (diabetes mellitus), type 2 (HCC)    Dyspnea    ED (erectile dysfunction)    ESBL (extended spectrum beta-lactamase) producing bacteria infection 2021   GERD (gastroesophageal reflux disease)    Hyperlipidemia    Hypertension    ILD (interstitial lung disease) (HCC)    Marijuana use    Osteoarthritis    Pneumonia    PTSD (post-traumatic stress disorder)    Seizure (HCC)    x1 years ago   Viral infection of left eye      Past Surgical History:  Procedure Laterality Date   BRONCHOSCOPY, WITH BIOPSY USING ELECTROMAGNETIC NAVIGATION Left 01/24/2024   Procedure: BRONCHOSCOPY, WITH BIOPSY USING ELECTROMAGNETIC NAVIGATION;  Surgeon: Isaiah Scrivener, MD;  Location: [facility] ORS;  Service: Pulmonary;  Laterality: Left;   ENDOBRONCHIAL ULTRASOUND Right 01/24/2024   Procedure: ENDOBRONCHIAL ULTRASOUND (EBUS);  Surgeon: Kasa, Kurian, MD;  Location: [facility] OR[facility];  Service: Pulmonary;  Laterality: Right;   IR IMAGING GUIDED PORT INSERTION  03/15/2024   pancreatic stent placement      Social History   Socioeconomic History   Marital status: Legally Separated    Spouse name: Not on file   Number of children: 2  Years of education: Not on file   Highest education level: High school graduate  Occupational History   Occupation: on disability  Tobacco Use   Smoking status: Former    Current packs/day: 3.00    Average packs/day: 3.0 packs/day for 15.0 years (45.0 ttl pk-yrs)    Types: Cigarettes   Smokeless tobacco: Never   Tobacco comments:    At his heaviest smoked 3ppd and now occasionally smoke 1 cigarette. 02/01/24  Vaping Use   Vaping status: Some Days  Substance and Sexual Activity   Alcohol  use: Yes    Alcohol /week: 7.0 standard drinks of alcohol     Types: 7 Cans of beer per week    Comment: occ   Drug use: Yes    Types: Marijuana, Crack cocaine    Comment: Marijuana every day. 7-1yrs clean of  crack cocaine.   Sexual activity: Yes    Birth control/protection: Condom  Other Topics Concern   Not on file  Social History Narrative   Not on file   Social Drivers of Health   Tobacco Use: Medium Risk (07/24/2024)   Patient History    Smoking Tobacco Use: Former    Smokeless Tobacco Use: Never    Passive Exposure: Not on Actuary Strain: Not on file  Food Insecurity: No Food Insecurity (07/24/2024)   Epic    Worried About Programme Researcher, Broadcasting/film/video in the Last Year: Never true    Ran Out of Food in the Last Year: Never true  Transportation Needs: No Transportation Needs (07/24/2024)   Epic    Lack of Transportation (Medical): No    Lack of Transportation (Non-Medical): No  Physical Activity: Not on file  Stress: Not on file  Social Connections: Not on file  Intimate Partner Violence: Not At Risk (07/24/2024)   Epic    Fear of Current or Ex-Partner: No    Emotionally Abused: No    Physically Abused: No    Sexually Abused: No  Depression (PHQ2-9): Low Risk (06/12/2024)   Depression (PHQ2-9)    PHQ-2 Score: 0  Alcohol  Screen: Low Risk (04/07/2023)   Alcohol  Screen    Last Alcohol  Screening Score (AUDIT): 4  Housing: Low Risk (07/24/2024)   Epic    Unable to Pay for Housing in the Last Year: No    Number of Times Moved in the Last Year: 0    Homeless in the Last Year: No  Utilities: Not At Risk (07/24/2024)   Epic    Threatened with loss of utilities: No  Health Literacy: Not on file    Family History  Problem Relation Age of Onset   Diabetes Mother    Sleep apnea Mother    Emphysema Sister    CAD Neg Hx    Mental illness Neg Hx     Current Medications[2]  Physical exam: There were no vitals filed for this visit. Physical Exam Constitutional:      Comments: Sitting in a wheelchair.  He is on home oxygen   Cardiovascular:     Rate and Rhythm: Normal rate and regular rhythm.     Heart sounds: Normal heart sounds.  Pulmonary:     Effort: Pulmonary  effort is normal.     Breath sounds: Normal breath sounds.  Abdominal:     General: Bowel sounds are normal.     Palpations: Abdomen is soft.  Skin:    General: Skin is warm and dry.  Neurological:     Mental Status: He is  alert and oriented to person, place, and time.      I have personally reviewed labs listed below:    Latest Ref Rng & Units 07/28/2024    6:05 AM  CMP  Glucose 70 - 99 mg/dL 836   BUN 6 - 20 mg/dL 35   Creatinine 9.38 - 1.24 mg/dL 8.46   Sodium 864 - 854 mmol/L 130   Potassium 3.5 - 5.1 mmol/L 4.0   Chloride 98 - 111 mmol/L 95   CO2 22 - 32 mmol/L 25   Calcium  8.9 - 10.3 mg/dL 9.5       Latest Ref Rng & Units 07/25/2024    5:46 AM  CBC  WBC 4.0 - 10.5 K/uL 5.6   Hemoglobin 13.0 - 17.0 g/dL 89.3   Hematocrit 60.9 - 52.0 % 31.6   Platelets 150 - 400 K/uL 296    I have personally reviewed Radiology images listed below: No images are attached to the encounter.  DG Chest 2 View Result Date: 07/24/2024 EXAM: 2 VIEW(S) XRAY OF THE CHEST 07/24/2024 12:01:00 PM COMPARISON: 07/09/2024 CLINICAL HISTORY: sob FINDINGS: LINES, TUBES AND DEVICES: Right chest wall Port-A-Cath in place. LUNGS AND PLEURA: Diffuse interstitial prominence. Stable left upper lung patchy consolidation/mass. No pleural effusion. No pneumothorax. HEART AND MEDIASTINUM: Aortic calcification. No acute abnormality of the cardiac and mediastinal silhouettes. BONES AND SOFT TISSUES: No acute osseous abnormality. IMPRESSION: 1. Stable left upper lung patchy consolidation/mass. 2. Diffuse interstitial prominence. Electronically signed by: Evalene Coho MD 07/24/2024 12:50 PM EST RP Workstation: [id number]   ECHOCARDIOGRAM COMPLETE Result Date: 07/11/2024    ECHOCARDIOGRAM REPORT   Patient Name:   TILFORD DEATON Date of Exam: 07/11/2024 Medical Rec #:  [id number]     Height:       67.0 in Accession #:    [id number]    Weight:       158.5 lb Date of Birth:  1965/08/31    BSA:          1.832 m Patient  Age:    58 years      BP:           115/77 mmHg Patient Gender: M             HR:           76 bpm. Exam Location:  [facility] Procedure: 2D Echo, Cardiac Doppler and Color Doppler (Both Spectral and Color            Flow Doppler were utilized during procedure). Indications:     Abnormal ECG R94.31  History:         Patient has prior history of Echocardiogram examinations, most                  recent 01/15/2024. CHF, COPD, Signs/Symptoms:Dyspnea; Risk                  Factors:Diabetes.  Sonographer:     Christopher Furnace Referring Phys:  [id number] AMRIT ADHIKARI Diagnosing Phys: Sabina Custovic IMPRESSIONS  1. Left ventricular ejection fraction, by estimation, is 60 to 65%. The left ventricle has normal function. The left ventricle has no regional wall motion abnormalities. Left ventricular diastolic parameters are consistent with Grade I diastolic dysfunction (impaired relaxation).  2. Right ventricular systolic function is normal. The right ventricular size is normal.  3. Left atrial size was mildly dilated.  4. The mitral valve is normal in structure. Trivial mitral valve regurgitation. No evidence of mitral stenosis.  5.  Tricuspid valve regurgitation is severe.  6. The aortic valve is normal in structure. Aortic valve regurgitation is mild. No aortic stenosis is present.  7. The inferior vena cava is normal in size with greater than 50% respiratory variability, suggesting right atrial pressure of 3 mmHg. FINDINGS  Left Ventricle: Left ventricular ejection fraction, by estimation, is 60 to 65%. The left ventricle has normal function. The left ventricle has no regional wall motion abnormalities. The left ventricular internal cavity size was normal in size. There is  no left ventricular hypertrophy. Left ventricular diastolic parameters are consistent with Grade I diastolic dysfunction (impaired relaxation). Right Ventricle: The right ventricular size is normal. No increase in right ventricular wall thickness. Right ventricular  systolic function is normal. Left Atrium: Left atrial size was mildly dilated. Right Atrium: Right atrial size was normal in size. Pericardium: There is no evidence of pericardial effusion. Mitral Valve: The mitral valve is normal in structure. Trivial mitral valve regurgitation. No evidence of mitral valve stenosis. MV peak gradient, 2.7 mmHg. The mean mitral valve gradient is 2.0 mmHg. Tricuspid Valve: The tricuspid valve is normal in structure. Tricuspid valve regurgitation is severe. Aortic Valve: The aortic valve is normal in structure. Aortic valve regurgitation is mild. No aortic stenosis is present. Aortic valve mean gradient measures 3.0 mmHg. Aortic valve peak gradient measures 4.8 mmHg. Aortic valve area, by VTI measures 8.19 cm. Pulmonic Valve: The pulmonic valve was normal in structure. Pulmonic valve regurgitation is not visualized. Aorta: The aortic root is normal in size and structure. Venous: The inferior vena cava is normal in size with greater than 50% respiratory variability, suggesting right atrial pressure of 3 mmHg. IAS/Shunts: No atrial level shunt detected by color flow Doppler.  LEFT VENTRICLE PLAX 2D LVIDd:         4.90 cm   Diastology LVIDs:         3.50 cm   LV e' medial:    10.60 cm/s LV PW:         1.20 cm   LV E/e' medial:  6.5 LV IVS:        1.30 cm   LV e' lateral:   11.10 cm/s LVOT diam:     3.35 cm   LV E/e' lateral: 6.2 LV SV:         115 LV SV Index:   63 LVOT Area:     8.81 cm LV IVRT:       100 msec  RIGHT VENTRICLE RV Basal diam:  3.90 cm     PULMONARY VEINS RV Mid diam:    3.50 cm     Diastolic Velocity: 33.00 cm/s RV S prime:     11.00 cm/s  S/D Velocity:       0.60                             Systolic Velocity:  19.70 cm/s LEFT ATRIUM             Index        RIGHT ATRIUM           Index LA diam:        4.40 cm 2.40 cm/m   RA Area:     17.60 cm LA Vol (A2C):   86.4 ml 47.16 ml/m  RA Volume:   45.10 ml  24.62 ml/m LA Vol (A4C):   24.7 ml 13.48 ml/m LA Biplane Vol:  50.6 ml  27.62 ml/m  AORTIC VALVE AV Area (Vmax):    5.62 cm AV Area (Vmean):   4.87 cm AV Area (VTI):     8.19 cm AV Vmax:           109.00 cm/s AV Vmean:          77.400 cm/s AV VTI:            0.141 m AV Peak Grad:      4.8 mmHg AV Mean Grad:      3.0 mmHg LVOT Vmax:         69.50 cm/s LVOT Vmean:        42.800 cm/s LVOT VTI:          0.131 m LVOT/AV VTI ratio: 0.93  AORTA Ao Root diam: 3.90 cm MITRAL VALVE               TRICUSPID VALVE MV Area (PHT): 4.77 cm    TR Peak grad:   55.1 mmHg MV Area VTI:   7.58 cm    TR Vmax:        371.00 cm/s MV Peak grad:  2.7 mmHg MV Mean grad:  2.0 mmHg    SHUNTS MV Vmax:       0.81 m/s    Systemic VTI:  0.13 m MV Vmean:      58.9 cm/s   Systemic Diam: 3.35 cm MV Decel Time: 159 msec MV E velocity: 68.70 cm/s MV A velocity: 94.90 cm/s MV E/A ratio:  0.72 Sabina Custovic Electronically signed by Annalee Casa Signature Date/Time: 07/11/2024/3:09:43 PM    Final    US  RENAL Result Date: 07/10/2024 EXAM: US  Retroperitoneum Complete, Renal. 07/10/2024 03:46:22 PM TECHNIQUE: Real-time ultrasonography of the retroperitoneum renal was performed. COMPARISON: None available CLINICAL HISTORY: Acute kidney disease. FINDINGS: FINDINGS: RIGHT KIDNEY/URETER: Right kidney measures 9.7 x 5.8 x 5.9 cm (174 cc). Mild scattered scarring leading to a lobulated contour as shown on prior exams. Normal cortical echogenicity. No hydronephrosis. No calculus. No mass. LEFT KIDNEY/URETER: Left kidney measures 10.1 x 6.4 x 5.2 cm (175 cc). Mild scattered scarring leading to a lobulated contour as shown on prior exams. Normal cortical echogenicity. No hydronephrosis. No calculus. No mass. BLADDER: Unremarkable appearance of the bladder. IMPRESSION: 1. Mild scattered bilateral renal cortical scarring with lobulated contour, unchanged from prior exams. Electronically signed by: Ryan Salvage MD 07/10/2024 09:26 PM EST RP Workstation: [id number]   CT Angio Chest Pulmonary Embolism (PE) W or WO  Contrast Result Date: 07/09/2024 EXAM: CTA of the Chest with contrast for PE 07/09/2024 10:53:27 PM TECHNIQUE: CTA of the chest was performed after the administration of intravenous contrast. 75 ml omnipaque  350 Multiplanar reformatted images are provided for review. MIP images are provided for review. Automated exposure control, iterative reconstruction, and/or weight based adjustment of the mA/kV was utilized to reduce the radiation dose to as low as reasonably achievable. COMPARISON: 01/16/2024, 01/25/2024 PET CT. CLINICAL HISTORY: Chest pain and positive D-dimer. History of left upper lobe lung carcinoma. FINDINGS: PULMONARY ARTERIES: The pulmonary artery shows no evidence of pulmonary embolism. Main pulmonary artery is normal in caliber. MEDIASTINUM: The heart is not significantly enlarged in size. Coronary calcifications are noted. The aorta shows atherosclerotic calcifications. No aneurysmal dilatation or dissection is seen. The esophagus, as visualized, is within normal limits. LYMPH NODES: No mediastinal, hilar or axillary lymphadenopathy. LUNGS AND PLEURA: The lungs demonstrate diffuse emphysematous changes similar to those seen on prior exams. The previously seen left upper lobe lung  mass again shows significant regression similar to that seen on prior PET CT from 1 month previous. Diffuse parenchymal opacity is noted, however, in the left upper lobe likely representing acute on chronic infiltrate. Similar findings are noted in the superior segment of the left lower lobe. Interstitial edema is seen. No new focal mass is noted. No sizable effusion is seen. No pneumothorax. UPPER ABDOMEN: Visualized upper abdomen shows diffuse calcifications throughout the pancreas consistent with chronic pancreatitis. SOFT TISSUES AND BONES: Right chest wall port is seen. Osseous structures are within normal limits. No acute soft tissue abnormality. IMPRESSION: 1. No evidence of pulmonary embolism. 2. Diffuse parenchymal  opacity in the left upper lobe, likely representing acute on chronic infiltrate, with similar findings in the superior segment of the left lower lobe. No new focal mass. 3. Nearly complete resolution of the previously seen left upper lobe lung mass, stable from prior imaging. 4. Diffuse emphysematous changes in the lungs, similar to prior exams. Electronically signed by: Oneil Devonshire MD 07/09/2024 11:06 PM EST RP Workstation: [id number]   DG Chest Portable 1 View Result Date: 07/09/2024 CLINICAL DATA:  SOB, hx ILD, CHF, LUL cancer EXAM: PORTABLE CHEST 1 VIEW COMPARISON:  January 24, 2024 FINDINGS: The cardiomediastinal silhouette is unchanged in contour.RIGHT chest port with tip terminating over the superior cavoatrial junction. No pleural effusion. No pneumothorax. Interval increase in a basilar predominant interstitial prominence. Known LEFT upper lobe malignancy is not well visualized radiographically and is favored decreased in size. IMPRESSION: Interval increase in a basilar predominant interstitial prominence. Differential considerations include pulmonary edema versus atypical infection superimposed on a background of interstitial lung disease. Electronically Signed   By: Corean Salter M.D.   On: 07/09/2024 17:58     Assessment and plan- Patient is a 58 y.o. 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  He is here for on treatment assessment prior to cycle 3 of maintenance durvalumab   Assessment and Plan    Possible pneumonia Clinically patient is hemodynamically stable.  He reports fever at home but no fever in clinic today.  He will proceed with durvalumab  today. - Ordered chest radiograph to evaluate for pneumonia. - Prescribed 7-day course of Augmentin  today  Small cell lung cancer, left upper lobe Previously treated with chemotherapy, now on immunotherapy with  no progression. Brain radiation discussed, pending radiation oncology input. - Continued immunotherapy as planned. - Ordered CT scan in two weeks for surveillance. - No chemotherapy at this time; reconsider if disease progression. - Will contact radiation oncology regarding possible brain radiation and future appointments.  Antineoplastic immunotherapy management Receiving durvalumab  with third cycle administered today. Continued due to controlled disease and lower immunosuppressive risk compared to chemotherapy. - Administered third cycle of durvalumab  today. - Scheduled follow-up in one month for fourth cycle of durvalumab .         Visit Diagnosis 1. Small cell lung cancer, left upper lobe (HCC)   2. Encounter for antineoplastic immunotherapy      Dr. Annah Skene, MD, MPH [facility] at [facility] [phone] 08/07/2024 9:30 AM                   [1]  Allergies Allergen Reactions   Hctz [Hydrochlorothiazide] Swelling   Ibuprofen Swelling  [2]  Current Outpatient Medications:    Accu-Chek Softclix Lancets lancets, Use with blood glucose monitor in the morning, at  noon, and at bedtime. May substitute to any manufacturer covered by patient's insurance., Disp: 100 each, Rfl: 0   [Paused] amLODipine -olmesartan  (AZOR ) 10-40 MG tablet, Take 1 tablet by mouth daily., Disp: 90 tablet, Rfl: 1   aspirin  EC 81 MG tablet, Take 1 tablet (81 mg total) by mouth daily. Swallow whole., Disp: 30 tablet, Rfl: 3   Blood Glucose Monitoring Suppl (BLOOD GLUCOSE MONITOR SYSTEM) w/Device KIT, Check blood sugar in the morning, at noon, and at bedtime. May substitute to any manufacturer covered by patient's insurance., Disp: 1 kit, Rfl: 0   Blood Pressure KIT, 1 each by Does not apply route 2 (two) times daily., Disp: 1 kit, Rfl: 0   empagliflozin  (JARDIANCE ) 10 MG TABS tablet, Take 1 tablet (10 mg total) by mouth daily before breakfast., Disp: 90 tablet, Rfl: 2   furosemide   (LASIX ) 20 MG tablet, Take 1 tablet (20 mg total) by mouth every other day., Disp: 30 tablet, Rfl: 0   ipratropium-albuterol  (DUONEB) 0.5-2.5 (3) MG/3ML SOLN, Take 3 mLs by nebulization every 2 (two) hours as needed (wheeze, SOB). Can use instead of albuterol  inhaler, Disp: 60 mL, Rfl: 1   Nebulizers (COMPRESSOR/NEBULIZER) MISC, Nebulizer and tubes/mask to use as directed with DuoNeb as needed, Disp: 1 each, Rfl: 0   omeprazole  (PRILOSEC) 20 MG capsule, Take 1 capsule (20 mg total) by mouth daily., Disp: 90 capsule, Rfl: 1   OXYGEN , Inhale 2 L into the lungs as needed., Disp: , Rfl:    Pancrelipase , Lip-Prot-Amyl, (ZENPEP ) 15000-47000 units CPEP, Take 1 capsule (15,000 Units total) by mouth 3 (three) times daily., Disp: 270 capsule, Rfl: 1   pravastatin  (PRAVACHOL ) 20 MG tablet, Take 1 tablet (20 mg total) by mouth daily at 6 PM., Disp: 90 tablet, Rfl: 2   sertraline  (ZOLOFT ) 100 MG tablet, TAKE 1 TABLET BY MOUTH ONCE DAILY, Disp: 90 tablet, Rfl: 0   spironolactone  (ALDACTONE ) 25 MG tablet, Take 1 tablet (25 mg total) by mouth daily. (Patient not taking: Reported on 07/24/2024), Disp: 90 tablet, Rfl: 3   VENTOLIN  HFA 108 (90 Base) MCG/ACT inhaler, Inhale 1-2 puffs into the lungs every 6 (six) hours as needed for wheezing or shortness of breath (or coughing)., Disp: 18 g, Rfl: 6  "

## 2024-08-07 NOTE — Patient Instructions (Signed)
 [facility] - A DEPT OF [facility]  Discharge Instructions: Thank you for choosing [facility] to provide your oncology and hematology care.  If you have a lab appointment with the Cancer Center, please go directly to the Cancer Center and check in at the registration area.  Wear comfortable clothing and clothing appropriate for easy access to any Portacath or PICC line.   We strive to give you quality time with your provider. You may need to reschedule your appointment if you arrive late (15 or more minutes).  Arriving late affects you and other patients whose appointments are after yours.  Also, if you miss three or more appointments without notifying the office, you may be dismissed from the clinic at the provider's discretion.      For prescription refill requests, have your pharmacy contact our office and allow 72 hours for refills to be completed.    Today you received the following chemotherapy and/or immunotherapy agents IMFINZI      To help prevent nausea and vomiting after your treatment, we encourage you to take your nausea medication as directed.  BELOW ARE SYMPTOMS THAT SHOULD BE REPORTED IMMEDIATELY: *FEVER GREATER THAN 100.4 F (38 C) OR HIGHER *CHILLS OR SWEATING *NAUSEA AND VOMITING THAT IS NOT CONTROLLED WITH YOUR NAUSEA MEDICATION *UNUSUAL SHORTNESS OF BREATH *UNUSUAL BRUISING OR BLEEDING *URINARY PROBLEMS (pain or burning when urinating, or frequent urination) *BOWEL PROBLEMS (unusual diarrhea, constipation, pain near the anus) TENDERNESS IN MOUTH AND THROAT WITH OR WITHOUT PRESENCE OF ULCERS (sore throat, sores in mouth, or a toothache) UNUSUAL RASH, SWELLING OR PAIN  UNUSUAL VAGINAL DISCHARGE OR ITCHING   Items with * indicate a potential emergency and should be followed up as soon as possible or go to the Emergency Department if any problems should occur.  Please show the CHEMOTHERAPY ALERT CARD or IMMUNOTHERAPY  ALERT CARD at check-in to the Emergency Department and triage nurse.  Should you have questions after your visit or need to cancel or reschedule your appointment, please contact [facility] - A DEPT OF [facility]  [phone] and follow the prompts.  Office hours are 8:00 a.m. to 4:30 p.m. Monday - Friday. Please note that voicemails left after 4:00 p.m. may not be returned until the following business day.  We are closed weekends and major holidays. You have access to a nurse at all times for urgent questions. Please call the main number to the clinic [phone] and follow the prompts.  For any non-urgent questions, you may also contact your provider using MyChart. We now offer e-Visits for anyone 30 and older to request care online for non-urgent symptoms. For details visit [email or web address].   Also download the MyChart app! Go to the app store, search "MyChart", open the app, select [facility], and log in with your MyChart username and password.  Durvalumab Injection What is this medication? DURVALUMAB (dur VAL ue mab) treats some types of cancer. It works by helping your immune system slow or stop the spread of cancer cells. It is a monoclonal antibody. This medicine may be used for other purposes; ask your health care provider or pharmacist if you have questions. COMMON BRAND NAME(S): IMFINZI What should I tell my care team before I take this medication? They need to know if you have any of these conditions: Allogeneic stem cell transplant (uses someone else's stem cells) Autoimmune diseases, such as Crohn disease, ulcerative  colitis, lupus History of chest radiation Nervous system problems, such as Guillain-Barre syndrome, myasthenia gravis Organ transplant An unusual or allergic reaction to durvalumab, other medications, foods, dyes, or preservatives Pregnant or trying to get pregnant Breast-feeding How should I use this medication? This  medication is infused into a vein. It is given by your care team in a hospital or clinic setting. A special MedGuide will be given to you before each treatment. Be sure to read this information carefully each time. Talk to your care team about the use of this medication in children. Special care may be needed. Overdosage: If you think you have taken too much of this medicine contact a poison control center or emergency room at once. NOTE: This medicine is only for you. Do not share this medicine with others. What if I miss a dose? Keep appointments for follow-up doses. It is important not to miss your dose. Call your care team if you are unable to keep an appointment. What may interact with this medication? Interactions have not been studied. This list may not describe all possible interactions. Give your health care provider a list of all the medicines, herbs, non-prescription drugs, or dietary supplements you use. Also tell them if you smoke, drink alcohol, or use illegal drugs. Some items may interact with your medicine. What should I watch for while using this medication? Your condition will be monitored carefully while you are receiving this medication. You may need blood work while taking this medication. This medication may cause serious skin reactions. They can happen weeks to months after starting the medication. Contact your care team right away if you notice fevers or flu-like symptoms with a rash. The rash may be red or purple and then turn into blisters or peeling of the skin. You may also notice a red rash with swelling of the face, lips, or lymph nodes in your neck or under your arms. Tell your care team right away if you have any change in your eyesight. Talk to your care team if you may be pregnant. Serious birth defects can occur if you take this medication during pregnancy and for 3 months after the last dose. You will need a negative pregnancy test before starting this medication.  Contraception is recommended while taking this medication and for 3 months after the last dose. Your care team can help you find the option that works for you. Do not breastfeed while taking this medication and for 3 months after the last dose. What side effects may I notice from receiving this medication? Side effects that you should report to your care team as soon as possible: Allergic reactions--skin rash, itching, hives, swelling of the face, lips, tongue, or throat Dry cough, shortness of breath or trouble breathing Eye pain, redness, irritation, or discharge with blurry or decreased vision Heart muscle inflammation--unusual weakness or fatigue, shortness of breath, chest pain, fast or irregular heartbeat, dizziness, swelling of the ankles, feet, or hands Hormone gland problems--headache, sensitivity to light, unusual weakness or fatigue, dizziness, fast or irregular heartbeat, increased sensitivity to cold or heat, excessive sweating, constipation, hair loss, increased thirst or amount of urine, tremors or shaking, irritability Infusion reactions--chest pain, shortness of breath or trouble breathing, feeling faint or lightheaded Kidney injury (glomerulonephritis)--decrease in the amount of urine, red or dark brown urine, foamy or bubbly urine, swelling of the ankles, hands, or feet Liver injury--right upper belly pain, loss of appetite, nausea, light-colored stool, dark yellow or brown urine, yellowing skin or eyes,  unusual weakness or fatigue Pain, tingling, or numbness in the hands or feet, muscle weakness, change in vision, confusion or trouble speaking, loss of balance or coordination, trouble walking, seizures Rash, fever, and swollen lymph nodes Redness, blistering, peeling, or loosening of the skin, including inside the mouth Sudden or severe stomach pain, bloody diarrhea, fever, nausea, vomiting Side effects that usually do not require medical attention (report these to your care team  if they continue or are bothersome): Bone, joint, or muscle pain Diarrhea Fatigue Loss of appetite Nausea Skin rash This list may not describe all possible side effects. Call your doctor for medical advice about side effects. You may report side effects to FDA at 1-800-FDA-1088. Where should I keep my medication? This medication is given in a hospital or clinic. It will not be stored at home. NOTE: This sheet is a summary. It may not cover all possible information. If you have questions about this medicine, talk to your doctor, pharmacist, or health care provider.  2024 Elsevier/Gold Standard (2021-12-16 00:00:00)

## 2024-08-08 LAB — T4: T4, Total: 7.9 ug/dL (ref 4.5–12.0)

## 2024-08-09 DIAGNOSIS — J9621 Acute and chronic respiratory failure with hypoxia: Secondary | ICD-10-CM | POA: Diagnosis not present

## 2024-08-09 DIAGNOSIS — E119 Type 2 diabetes mellitus without complications: Secondary | ICD-10-CM | POA: Diagnosis not present

## 2024-08-09 DIAGNOSIS — I110 Hypertensive heart disease with heart failure: Secondary | ICD-10-CM | POA: Diagnosis not present

## 2024-08-09 DIAGNOSIS — Z7982 Long term (current) use of aspirin: Secondary | ICD-10-CM | POA: Diagnosis not present

## 2024-08-09 DIAGNOSIS — R7989 Other specified abnormal findings of blood chemistry: Secondary | ICD-10-CM | POA: Diagnosis not present

## 2024-08-09 DIAGNOSIS — Z7902 Long term (current) use of antithrombotics/antiplatelets: Secondary | ICD-10-CM | POA: Diagnosis not present

## 2024-08-09 DIAGNOSIS — F17210 Nicotine dependence, cigarettes, uncomplicated: Secondary | ICD-10-CM | POA: Diagnosis not present

## 2024-08-09 DIAGNOSIS — Z7984 Long term (current) use of oral hypoglycemic drugs: Secondary | ICD-10-CM | POA: Diagnosis not present

## 2024-08-09 DIAGNOSIS — R918 Other nonspecific abnormal finding of lung field: Secondary | ICD-10-CM | POA: Diagnosis not present

## 2024-08-09 DIAGNOSIS — R590 Localized enlarged lymph nodes: Secondary | ICD-10-CM | POA: Diagnosis not present

## 2024-08-09 DIAGNOSIS — I509 Heart failure, unspecified: Secondary | ICD-10-CM | POA: Diagnosis not present

## 2024-08-09 DIAGNOSIS — Z85118 Personal history of other malignant neoplasm of bronchus and lung: Secondary | ICD-10-CM | POA: Diagnosis not present

## 2024-08-09 DIAGNOSIS — J189 Pneumonia, unspecified organism: Secondary | ICD-10-CM | POA: Diagnosis not present

## 2024-08-10 DIAGNOSIS — J189 Pneumonia, unspecified organism: Secondary | ICD-10-CM | POA: Diagnosis not present

## 2024-08-10 DIAGNOSIS — J439 Emphysema, unspecified: Secondary | ICD-10-CM | POA: Diagnosis not present

## 2024-08-10 DIAGNOSIS — J90 Pleural effusion, not elsewhere classified: Secondary | ICD-10-CM | POA: Diagnosis not present

## 2024-08-15 ENCOUNTER — Other Ambulatory Visit: Payer: Self-pay | Admitting: Oncology

## 2024-08-15 ENCOUNTER — Telehealth: Payer: Self-pay

## 2024-08-15 NOTE — Telephone Encounter (Signed)
 Voicemail received from Dr. Wonda at [facility] today 08/15/24 at 1:00pm requesting a call back from Dr. Melanee to [phone].  Dr. Wonda indicated if you do not reach her at that number her cell phone is [phone]

## 2024-08-15 NOTE — Telephone Encounter (Signed)
 Media scanned into chart from patient's Internal Medicine MD indicating progressive SOB for the past 4 weeks, mentioned possibility of checkpoint inhibitor pneumonitis.    Patient had durvalumab  on 12/22 and 10/27.  Patient is scheduled for a CT c/a/p w/ contrast on 08/21/24 at 1pm.  Dr. Melanee indicated depending on how that results will guide treatment plan.  Outbound call to [facility]; spoke to [employer] in CCU who indicated patient is currently admitted.  Patient keeps desatting,  readmitted to hospital on Christmas. RN reports he can barely eat or use the bathroom. Provided telephone # to clinic, indicated pulmonologist will be rounding shortly and may need to speak with Dr. Melanee.

## 2024-08-16 ENCOUNTER — Ambulatory Visit: Admitting: Family

## 2024-08-21 ENCOUNTER — Ambulatory Visit: Admission: RE | Admit: 2024-08-21 | Source: Ambulatory Visit

## 2024-08-23 ENCOUNTER — Ambulatory Visit: Payer: Self-pay | Admitting: Oncology

## 2024-08-24 NOTE — Progress Notes (Signed)
 Per Dr. Melanee patient is currently hospitalized.

## 2024-08-28 ENCOUNTER — Telehealth: Payer: Self-pay | Admitting: Oncology

## 2024-08-28 NOTE — Telephone Encounter (Signed)
 Called pt to r/s missed CT - left vm - LH

## 2024-08-30 ENCOUNTER — Telehealth: Payer: Self-pay | Admitting: Oncology

## 2024-08-30 NOTE — Telephone Encounter (Signed)
 Called pt to r/s missed CT - pt answered and said he's on oxygen  all the time so he won't be able to come in for the CT appt - asked if pt would be able to still come in for port flush and Rao on 1/19 and he said he can't - cancelling appts and noting about this - [facility]

## 2024-08-31 ENCOUNTER — Encounter: Payer: Self-pay | Admitting: Oncology

## 2024-09-04 ENCOUNTER — Inpatient Hospital Stay

## 2024-09-04 ENCOUNTER — Inpatient Hospital Stay: Admitting: Oncology

## 2024-09-06 ENCOUNTER — Telehealth: Payer: Self-pay | Admitting: *Deleted

## 2024-09-06 ENCOUNTER — Encounter: Payer: Self-pay | Admitting: Internal Medicine

## 2024-09-06 ENCOUNTER — Telehealth: Admitting: Internal Medicine

## 2024-09-06 DIAGNOSIS — C3492 Malignant neoplasm of unspecified part of left bronchus or lung: Secondary | ICD-10-CM

## 2024-09-06 DIAGNOSIS — I5033 Acute on chronic diastolic (congestive) heart failure: Secondary | ICD-10-CM

## 2024-09-06 DIAGNOSIS — J441 Chronic obstructive pulmonary disease with (acute) exacerbation: Secondary | ICD-10-CM

## 2024-09-06 DIAGNOSIS — J849 Interstitial pulmonary disease, unspecified: Secondary | ICD-10-CM

## 2024-09-06 NOTE — Progress Notes (Unsigned)
 "  Virtual Visit via Video Note  I connected with Noah Velasquez on 09/06/24 at 11:20 AM EST by a video enabled telemedicine application and verified that I am speaking with the correct person using two identifiers.  Location: Patient: Home Provider: Office  Person's participating in this video call: Angeline Laura, NP-C and Trapper Meech   I discussed the limitations of evaluation and management by telemedicine and the availability of in person appointments. The patient expressed understanding and agreed to proceed.  History of Present Illness:   Discussed the use of AI scribe software for clinical note transcription with the patient, who gave verbal consent to proceed.  Noah Velasquez is a 59 year old male with lung cancer and COPD who presents for a TCM hospital follow-up after discharge for acute and chronic hypoxic respiratory failure.  He was discharged from the hospital on December 13th after being admitted on December 8th with shortness of breath and hypoxia. He has a history of lung cancer, interstitial lung disease, and COPD, and is on chronic oxygen  therapy. During his hospital stay, his oxygen  saturation was 80% on room air, which improved to 80% on 2 liters and eventually required 4 liters of oxygen .  During his hospital stay, he was found to have persistent anemia with hemoglobin and hematocrit levels of 10.8 and 33.5, respectively. His BNP was elevated at 5723, and troponin was elevated at 78. A chest x-ray was performed during his hospital stay. He was treated with oral prednisone  and doxycycline , and initially with IV Lasix , which was later transitioned to furosemide  20 mg every other day due to a rise in creatinine levels. His creatinine peaked at 2.03 but improved to his baseline of 1.47. He was somewhat hypotensive during admission, leading to the holding of his amlodipine  and olmesartan .  Since discharge, he has continued shortness of breath, although not constant, and is  currently using 8 liters of oxygen . He has not resumed chemotherapy or radiation and is unsure about his next oncology appointment. He is monitoring his blood pressure, which has been running slightly high, and has resumed his blood pressure medications. His blood sugar was reported as 89.      Past Medical History:  Diagnosis Date   Alcohol  use disorder    Aortic atherosclerosis    Bronchiolo-alveolar adenocarcinoma of left lung (HCC)    CHF (congestive heart failure) (HCC)    Chronic hip pain    Chronic pancreatitis (HCC)    Cigarette smoker    CKD (chronic kidney disease) stage 3, GFR 30-59 ml/min (HCC)    COPD (chronic obstructive pulmonary disease) (HCC)    Crack cocaine use    as of 01-21-24, pt states he last used a few weeks ago   Depression    DM (diabetes mellitus), type 2 (HCC)    Dyspnea    ED (erectile dysfunction)    ESBL (extended spectrum beta-lactamase) producing bacteria infection 2021   GERD (gastroesophageal reflux disease)    Hyperlipidemia    Hypertension    ILD (interstitial lung disease) (HCC)    Marijuana use    Osteoarthritis    Pneumonia    PTSD (post-traumatic stress disorder)    Seizure (HCC)    x1 years ago   Viral infection of left eye     Current Outpatient Medications  Medication Sig Dispense Refill   Accu-Chek Softclix Lancets lancets Use with blood glucose monitor in the morning, at noon, and at bedtime. May substitute to any manufacturer covered by patient's  insurance. 100 each 0   [Paused] amLODipine -olmesartan  (AZOR ) 10-40 MG tablet Take 1 tablet by mouth daily. 90 tablet 1   amoxicillin -clavulanate (AUGMENTIN ) 875-125 MG tablet Take 1 tablet by mouth 2 (two) times daily. 14 tablet 0   aspirin  EC 81 MG tablet Take 1 tablet (81 mg total) by mouth daily. Swallow whole. 30 tablet 3   Blood Glucose Monitoring Suppl (BLOOD GLUCOSE MONITOR SYSTEM) w/Device KIT Check blood sugar in the morning, at noon, and at bedtime. May substitute to any  manufacturer covered by patient's insurance. 1 kit 0   Blood Pressure KIT 1 each by Does not apply route 2 (two) times daily. 1 kit 0   empagliflozin  (JARDIANCE ) 10 MG TABS tablet Take 1 tablet (10 mg total) by mouth daily before breakfast. 90 tablet 2   furosemide  (LASIX ) 20 MG tablet Take 1 tablet (20 mg total) by mouth every other day. 30 tablet 0   ipratropium-albuterol  (DUONEB) 0.5-2.5 (3) MG/3ML SOLN Take 3 mLs by nebulization every 2 (two) hours as needed (wheeze, SOB). Can use instead of albuterol  inhaler 60 mL 1   Nebulizers (COMPRESSOR/NEBULIZER) MISC Nebulizer and tubes/mask to use as directed with DuoNeb as needed 1 each 0   omeprazole  (PRILOSEC) 20 MG capsule Take 1 capsule (20 mg total) by mouth daily. 90 capsule 1   OXYGEN  Inhale 2 L into the lungs as needed.     Pancrelipase , Lip-Prot-Amyl, (ZENPEP ) 15000-47000 units CPEP Take 1 capsule (15,000 Units total) by mouth 3 (three) times daily. 270 capsule 1   pravastatin  (PRAVACHOL ) 20 MG tablet Take 1 tablet (20 mg total) by mouth daily at 6 PM. 90 tablet 2   sertraline  (ZOLOFT ) 100 MG tablet TAKE 1 TABLET BY MOUTH ONCE DAILY 90 tablet 0   spironolactone  (ALDACTONE ) 25 MG tablet Take 1 tablet (25 mg total) by mouth daily. (Patient not taking: Reported on 08/07/2024) 90 tablet 3   traZODone  (DESYREL ) 50 MG tablet Take 1 tablet (50 mg total) by mouth at bedtime. 30 tablet 3   VENTOLIN  HFA 108 (90 Base) MCG/ACT inhaler Inhale 1-2 puffs into the lungs every 6 (six) hours as needed for wheezing or shortness of breath (or coughing). 18 g 6   No current facility-administered medications for this visit.    Allergies[1]  Family History  Problem Relation Age of Onset   Diabetes Mother    Sleep apnea Mother    Emphysema Sister    CAD Neg Hx    Mental illness Neg Hx     Social History   Socioeconomic History   Marital status: Legally Separated    Spouse name: Not on file   Number of children: 2   Years of education: Not on file    Highest education level: High school graduate  Occupational History   Occupation: on disability  Tobacco Use   Smoking status: Former    Current packs/day: 3.00    Average packs/day: 3.0 packs/day for 15.0 years (45.0 ttl pk-yrs)    Types: Cigarettes   Smokeless tobacco: Never   Tobacco comments:    At his heaviest smoked 3ppd and now occasionally smoke 1 cigarette. 02/01/24  Vaping Use   Vaping status: Some Days  Substance and Sexual Activity   Alcohol  use: Yes    Alcohol /week: 7.0 standard drinks of alcohol     Types: 7 Cans of beer per week    Comment: occ   Drug use: Yes    Types: Marijuana, Crack cocaine    Comment: Marijuana  every day. 7-28yrs clean of crack cocaine.   Sexual activity: Yes    Birth control/protection: Condom  Other Topics Concern   Not on file  Social History Narrative   Not on file   Social Drivers of Health   Tobacco Use: Medium Risk (08/07/2024)   Patient History    Smoking Tobacco Use: Former    Smokeless Tobacco Use: Never    Passive Exposure: Not on Actuary Strain: Not on file  Food Insecurity: No Food Insecurity (07/24/2024)   Epic    Worried About Programme Researcher, Broadcasting/film/video in the Last Year: Never true    Ran Out of Food in the Last Year: Never true  Transportation Needs: No Transportation Needs (07/24/2024)   Epic    Lack of Transportation (Medical): No    Lack of Transportation (Non-Medical): No  Physical Activity: Not on file  Stress: Not on file  Social Connections: Not on file  Intimate Partner Violence: Not At Risk (07/24/2024)   Epic    Fear of Current or Ex-Partner: No    Emotionally Abused: No    Physically Abused: No    Sexually Abused: No  Depression (PHQ2-9): Low Risk (08/07/2024)   Depression (PHQ2-9)    PHQ-2 Score: 0  Alcohol  Screen: Low Risk (04/07/2023)   Alcohol  Screen    Last Alcohol  Screening Score (AUDIT): 4  Housing: Low Risk (07/24/2024)   Epic    Unable to Pay for Housing in the Last Year: No     Number of Times Moved in the Last Year: 0    Homeless in the Last Year: No  Utilities: Not At Risk (07/24/2024)   Epic    Threatened with loss of utilities: No  Health Literacy: Not on file     Constitutional: Denies fever, malaise, fatigue, headache or abrupt weight changes.  HEENT: Pt reports dry eyes, eye pressure. Denies eye pain, eye redness, ear pain, ringing in the ears, wax buildup, runny nose, nasal congestion, bloody nose, or sore throat. Respiratory: Patient reports chronic cough and shortness of breath.  Denies difficulty breathing, or sputum production.   Cardiovascular: Denies chest pain, chest tightness, palpitations or swelling in the hands or feet.  Gastrointestinal: Pt reports intermittent diarrhea. Denies abdominal pain, bloating, constipation, or blood in the stool.  GU: Patient reports erectile dysfunction.  Denies urgency, frequency, pain with urination, burning sensation, blood in urine, odor or discharge. Musculoskeletal: Patient reports joint pain, difficulty with gait.  Denies decrease in range of motion, muscle pain or joint swelling.  Skin: Denies redness, rashes, lesions or ulcercations.  Neurological: Patient reports insomnia, neuropathy.  Denies dizziness, difficulty with memory, difficulty with speech or problems with balance and coordination.  Psych: Patient has a history of anxiety and depression.  Denies SI/HI.   No other specific complaints in a complete review of systems (except as listed in HPI above).  Observations/Objective:  Wt Readings from Last 3 Encounters:  08/07/24 155 lb 9.6 oz (70.6 kg)  07/29/24 164 lb 3.9 oz (74.5 kg)  07/14/24 155 lb 6.8 oz (70.5 kg)    General: Appears older than his stated age, chronically ill appearing, in NAD. Pulmonary/Chest: Increased effort, wearing oxygen . No respiratory distress.  Neurological: Alert and oriented.  Coordination normal.  Psychiatric: Mood and affect normal. Behavior is normal. Judgment and  thought content normal.     BMET    Component Value Date/Time   NA 132 (L) 08/07/2024 0929   K 3.5 08/07/2024 9070  CL 96 (L) 08/07/2024 0929   CO2 20 (L) 08/07/2024 0929   GLUCOSE 152 (H) 08/07/2024 0929   BUN 12 08/07/2024 0929   CREATININE 1.51 (H) 08/07/2024 0929   CREATININE 1.03 12/07/2023 1334   CALCIUM  9.5 08/07/2024 0929   GFRNONAA 53 (L) 08/07/2024 0929   GFRNONAA 64 06/06/2020 1141   GFRAA 74 06/06/2020 1141    Lipid Panel     Component Value Date/Time   CHOL 115 06/06/2024 1341   TRIG 112 06/06/2024 1341   HDL 64 06/06/2024 1341   CHOLHDL 1.8 06/06/2024 1341   VLDL 6 08/01/2022 0342   LDLCALC 31 06/06/2024 1341    CBC    Component Value Date/Time   WBC 11.7 (H) 08/07/2024 0929   WBC 5.6 07/25/2024 0546   RBC 4.02 (L) 08/07/2024 0929   HGB 12.5 (L) 08/07/2024 0929   HCT 37.6 (L) 08/07/2024 0929   PLT 304 08/07/2024 0929   MCV 93.5 08/07/2024 0929   MCH 31.1 08/07/2024 0929   MCHC 33.2 08/07/2024 0929   RDW 14.4 08/07/2024 0929   LYMPHSABS 1.2 08/07/2024 0929   MONOABS 0.5 08/07/2024 0929   EOSABS 0.3 08/07/2024 0929   BASOSABS 0.1 08/07/2024 0929    Hgb A1C Lab Results  Component Value Date   HGBA1C 7.1 (H) 06/06/2024       Assessment and Plan:  Assessment and Plan    TCM hospital follow-up for acute on chronic hypoxia secondary to lung cancer, ILD, CHF exacerbation and COPD Lung cancer recently diagnosed in July 2025. Not undergoing chemotherapy or radiation. Missed recent CT scan and port flush due to oxygen  requirements.  Hospital notes, labs and imaging reviewed - Contact oncologist to schedule follow-up with Dr. Yaakov. - Ensure follow-up with oncology for further management. - Continue current oxygen  therapy, advised not to exceed 6 liters unless necessary. - Continue to monitor blood pressure at home. - Need to schedule lab appointment for metabolic panel to reassess kidney function     RTC in 3 months for annual exam  Follow  Up Instructions:    I discussed the assessment and treatment plan with the patient. The patient was provided an opportunity to ask questions and all were answered. The patient agreed with the plan and demonstrated an understanding of the instructions.   The patient was advised to call back or seek an in-person evaluation if the symptoms worsen or if the condition fails to improve as anticipated.   Angeline Laura, NP     [1]  Allergies Allergen Reactions   Hctz [Hydrochlorothiazide] Swelling   Ibuprofen Swelling   "

## 2024-09-06 NOTE — Telephone Encounter (Signed)
 Pt left message asking if port can be removed. Please advise.

## 2024-09-07 ENCOUNTER — Encounter: Payer: Self-pay | Admitting: Internal Medicine

## 2024-09-07 ENCOUNTER — Encounter: Payer: Self-pay | Admitting: Oncology

## 2024-09-07 NOTE — Patient Instructions (Signed)
 COPD Exacerbation Learn about triggers that can make COPD worse and how to avoid them. To view the content, go to this web address: [email or web address]  This video will expire on: 01/10/2026. If you need access to this video following this date, please reach out to the healthcare provider who assigned it to you. This information is not intended to replace advice given to you by your health care provider. Make sure you discuss any questions you have with your health care provider. Elsevier Patient Education  [employer].

## 2024-09-07 NOTE — Telephone Encounter (Signed)
 I would like to have an in person or video visit first before taking the port out

## 2024-09-11 ENCOUNTER — Encounter: Payer: Self-pay | Admitting: Family

## 2024-09-11 ENCOUNTER — Ambulatory Visit: Payer: Self-pay

## 2024-09-11 NOTE — Telephone Encounter (Signed)
 Pt is requesting referral for nursing home care, the one in [place]. Pt declined appt and stated he just wants a referral.   FYI Only or Action Required?: Action required by provider: clinical question for provider.  Patient was last seen in primary care on 09/06/2024 by Antonette Angeline ORN, NP.  Called Nurse Triage reporting Shortness of Breath.   Interventions attempted: Prescription medications: home O2.  Symptoms are: unchanged.  Triage Disposition: See PCP Within 2 Weeks  Patient/caregiver understands and will follow disposition?: No, refuses disposition    Reason for Triage: Patient has sob all the time  Reason for Disposition  [1] MILD longstanding difficulty breathing (e.g., minimal/no SOB at rest, SOB with walking, pulse < 100) AND [2] SAME as normal  Answer Assessment - Initial Assessment Questions 1. RESPIRATORY STATUS: Describe your breathing? (e.g., wheezing, shortness of breath, unable to speak, severe coughing)      SOB with exertion  2. ONSET: When did this breathing problem begin?      A while   4. SEVERITY: How bad is your breathing? (e.g., mild, moderate, severe)      Fine right now, otherwise I wouldn't be able to talk to you, wearing 6L O2 [place]  5. RECURRENT SYMPTOM: Have you had difficulty breathing before? If Yes, ask: When was the last time? and What happened that time?      Yes  6. CANCER: What type of cancer do you have?      Lung   12. O2 SATURATION MONITOR: Do you use an oxygen  saturation monitor (pulse oximeter) at home? If Yes, ask: What is your reading (oxygen  level) today? What is your usual oxygen  saturation reading? (e.g., 95%)       Does not have at home  Protocols used: Cancer - Breathing Difficulty-A-AH

## 2024-09-12 ENCOUNTER — Telehealth: Payer: Self-pay

## 2024-09-12 NOTE — Telephone Encounter (Signed)
 This is a duplicate message.  This is not just a referral.  This requires in person evaluation with FL 2 form completion.  He will need an appointment.

## 2024-09-12 NOTE — Telephone Encounter (Signed)
 This is not something that just requires a referral.  This requires FL2 form completion and requires an in person visit.

## 2024-09-12 NOTE — Telephone Encounter (Signed)
 Copied from CRM [id number]. Topic: Clinical - Home Health Verbal Orders >> Sep 11, 2024 12:39 PM Victoria B wrote: Caller/Agency: patient, Noah Velasquez Number: [phone] Service Requested: Skilled Nursing Frequency: requesting to be put in nursing home for round the clock care for his stage 3 lung cancer in [facility] Any new concerns about the patient? no

## 2024-09-12 NOTE — Telephone Encounter (Signed)
 Appt scheduled for 09/21/2024

## 2024-09-14 ENCOUNTER — Encounter: Payer: Self-pay | Admitting: Oncology

## 2024-09-14 ENCOUNTER — Other Ambulatory Visit: Payer: Self-pay

## 2024-09-14 ENCOUNTER — Inpatient Hospital Stay: Attending: Radiation Oncology | Admitting: Oncology

## 2024-09-14 DIAGNOSIS — C3412 Malignant neoplasm of upper lobe, left bronchus or lung: Secondary | ICD-10-CM

## 2024-09-14 MED ORDER — FUROSEMIDE 20 MG PO TABS: 20.0000 mg | ORAL_TABLET | Freq: Every day | ORAL | 1 refills | Status: AC

## 2024-09-14 NOTE — Progress Notes (Signed)
 Ran out of lasix  (requesting this be refilled).  Also out of prilosec but has been taking pantoprazole . Pain 8/10 right hip, managing with oxy.  Eats about 1-2x daily. SOB w/ exertion and walking a few feet.  left foot has neuropathy; feels tingling and numbness.

## 2024-09-16 ENCOUNTER — Encounter: Payer: Self-pay | Admitting: Oncology

## 2024-09-16 NOTE — Progress Notes (Signed)
 I connected with Noah Velasquez on 09/16/24 at  1:00 PM EST by video enabled telemedicine visit and verified that I am speaking with the correct person using two identifiers.   I discussed the limitations, risks, security and privacy concerns of performing an evaluation and management service by telemedicine and the availability of in-person appointments. I also discussed with the patient that there may be a patient responsible charge related to this service. The patient expressed understanding and agreed to proceed.  Other persons participating in the visit and their role in the encounter:  none  Patient's location:  home Provider's location:  work  Stage Manager Complaint:  post hospital discharge follow up   Cancer Staging  Small cell lung cancer, left upper lobe (HCC) Staging form: Lung, AJCC V9 - Clinical stage from 02/21/2024: Stage IIIB (cT2, cN2b, cM0) - Signed by Noah Annah BROCKS, MD on 02/21/2024 Method of lymph node assessment: Clinical    History of present illness: Patient is a 59 year old African-American male with a past medical history significant for CHF, interstitial lung disease, type 2 diabetes.  He is mainly wheelchair-bound but lives independently.  He underwent CT chest without contrast when he had a chest x-ray which showed a lung nodule.  CT chest showed a 3.7 x 1.8 cm lobulated mass in the left upper lobe abutting the chest wall concerning for primary bronchogenic carcinoma.  Adjacent satellite nodularity in the left upper lobe.  14 mm right paratracheal node and 14 mm short axis prevascular node.  Moderate centrilobular and paraseptal emphysematous changes.  MRI brain with and without contrast showed remote infarcts and chronic microvascular disease but no evidence of acute intracranial abnormality.  Patient was seen by Dr. Isaiah and underwent bronchoscopy.  Left upper lobe lung biopsy was consistent with poorly differentiated carcinoma consistent with small cell carcinoma.  Tumor was  positive for TTF-1 and neuroendocrine markers synaptophysin.  Negative for Napsin A.  Negative for squamous cell marker p40.FNA of the right paratracheal adenopathy was done and was negative for malignancy but left-sided mediastinal and hilar lymph nodes were not sampled at the time of bronchoscopy.  He uses 2 L of oxygen  mainly at night but not during the day   PET CT scan in July 2025 showed left upper lobe lung mass with an SUV of 15.7 consistent with neoplasm.  Additional small adjacent nodules in the left upper lobe.  Several left-sided upper mediastinal hypermetabolic lymph nodes including an AP window node with an SUV of 13.8 measuring 3 x 2.3 cm.  Another lymph node to the left of the ascending aorta measuring 3.6 x 1.7 cm with an SUV of 15.9.  Small nodes superior to this level at the level of great vessels as well as additional suspicious nodes in the right paratracheal region with an SUV of 4.4.  Area of uptake along the proximal left humeral shaft with an SUV of 9.3 which could be an area of isolated bone metastases.  Low level of uptake in the right inguinal lymph node with an SUV of 3.7.   Patient had MRI of his left humerus which showed a 6 cm mass in the intramedullary region in the diaphysis of mid to proximal left humerus which was concerning for metastatic disease.  Patient is still being treated with the potential curative intent.  He completed concurrent chemoradiation with cisplatin  etoposide  and will be receiving palliative radiation to his left humerus.   Patient received 2 doses of maintenance durvalumab  in October and December 2025.  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  He was ultimately discharged on home oxygen  6 L at rest.  He was on tapering course of prednisone       Interval history he is presently pretty much homebound on home oxygen .  He tries to get out of home as less as  possible.  His family member helps him with grocery shopping and other needs.   Review of Systems  Constitutional:  Positive for malaise/fatigue. Negative for chills, fever and weight loss.  HENT:  Negative for congestion, ear discharge and nosebleeds.   Eyes:  Negative for blurred vision.  Respiratory:  Positive for shortness of breath. Negative for cough, hemoptysis, sputum production and wheezing.   Cardiovascular:  Negative for chest pain, palpitations, orthopnea and claudication.  Gastrointestinal:  Negative for abdominal pain, blood in stool, constipation, diarrhea, heartburn, melena, nausea and vomiting.  Genitourinary:  Negative for dysuria, flank pain, frequency, hematuria and urgency.  Musculoskeletal:  Negative for back pain, joint pain and myalgias.  Skin:  Negative for rash.  Neurological:  Negative for dizziness, tingling, focal weakness, seizures, weakness and headaches.  Endo/Heme/Allergies:  Does not bruise/bleed easily.  Psychiatric/Behavioral:  Negative for depression and suicidal ideas. The patient does not have insomnia.     Allergies[1]  Past Medical History:  Diagnosis Date   Alcohol  use disorder    Aortic atherosclerosis    Bronchiolo-alveolar adenocarcinoma of left lung (HCC)    CHF (congestive heart failure) (HCC)    Chronic hip pain    Chronic pancreatitis (HCC)    Cigarette smoker    CKD (chronic kidney disease) stage 3, GFR 30-59 ml/min (HCC)    COPD (chronic obstructive pulmonary disease) (HCC)    Crack cocaine use    as of 01-21-24, pt states he last used a few weeks ago   Depression    DM (diabetes mellitus), type 2 (HCC)    Dyspnea    ED (erectile dysfunction)    ESBL (extended spectrum beta-lactamase) producing bacteria infection 2021   GERD (gastroesophageal reflux disease)    Hyperlipidemia    Hypertension    ILD (interstitial lung disease) (HCC)    Marijuana use    Osteoarthritis    Pneumonia    PTSD (post-traumatic stress disorder)     Seizure (HCC)    x1 years ago   Viral infection of left eye     Past Surgical History:  Procedure Laterality Date   BRONCHOSCOPY, WITH BIOPSY USING ELECTROMAGNETIC NAVIGATION Left 01/24/2024   Procedure: BRONCHOSCOPY, WITH BIOPSY USING ELECTROMAGNETIC NAVIGATION;  Surgeon: Isaiah Scrivener, MD;  Location: [facility] ORS;  Service: Pulmonary;  Laterality: Left;   ENDOBRONCHIAL ULTRASOUND Right 01/24/2024   Procedure: ENDOBRONCHIAL ULTRASOUND (EBUS);  Surgeon: Kasa, Kurian, MD;  Location: [facility] ORS;  Service: Pulmonary;  Laterality: Right;   IR IMAGING GUIDED PORT INSERTION  03/15/2024   pancreatic stent placement      Social History   Socioeconomic History   Marital status: Legally Separated    Spouse name: Not on file   Number of children: 2   Years of education: Not on file   Highest education level: High school graduate  Occupational History   Occupation: on disability  Tobacco Use   Smoking status: Former    Current packs/day: 3.00    Average packs/day: 3.0 packs/day for 15.0 years (45.0 ttl pk-yrs)    Types: Cigarettes   Smokeless tobacco: Never  Tobacco comments:    At his heaviest smoked 3ppd and now occasionally smoke 1 cigarette. 02/01/24  Vaping Use   Vaping status: Some Days  Substance and Sexual Activity   Alcohol  use: Yes    Alcohol /week: 7.0 standard drinks of alcohol     Types: 7 Cans of beer per week    Comment: occ   Drug use: Yes    Types: Marijuana, Crack cocaine    Comment: Marijuana every day. 7-46yrs clean of crack cocaine.   Sexual activity: Yes    Birth control/protection: Condom  Other Topics Concern   Not on file  Social History Narrative   Not on file   Social Drivers of Health   Tobacco Use: Medium Risk (09/14/2024)   Patient History    Smoking Tobacco Use: Former    Smokeless Tobacco Use: Never    Passive Exposure: Not on file  Financial Resource Strain: Low Risk (08/20/2024)   Received from [facility]   Overall Financial Resource Strain  (CARDIA)    How hard is it for you to pay for the very basics like food, housing, medical care, and heating?: Not hard at all  Food Insecurity: No Food Insecurity (08/20/2024)   Received from [facility]   Epic    Within the past 12 months, you worried that your food would run out before you got the money to buy more.: Never true    Within the past 12 months, the food you bought just didn't last and you didn't have money to get more.: Never true  Transportation Needs: No Transportation Needs (08/20/2024)   Received from [facility]   PRAPARE - Transportation    Lack of Transportation (Medical): No    Lack of Transportation (Non-Medical): No  Physical Activity: Not on file  Stress: Not on file  Social Connections: Not on file  Intimate Partner Violence: Not At Risk (07/24/2024)   Epic    Fear of Current or Ex-Partner: No    Emotionally Abused: No    Physically Abused: No    Sexually Abused: No  Depression (PHQ2-9): Low Risk (09/14/2024)   Depression (PHQ2-9)    PHQ-2 Score: 0  Alcohol  Screen: Low Risk (04/07/2023)   Alcohol  Screen    Last Alcohol  Screening Score (AUDIT): 4  Housing: Low Risk (07/24/2024)   Epic    Unable to Pay for Housing in the Last Year: No    Number of Times Moved in the Last Year: 0    Homeless in the Last Year: No  Utilities: Low Risk (08/20/2024)   Received from [facility]   Utilities    Within the past 12 months, have you been unable to get utilities(heat, electricity) when it was really needed?: No  Health Literacy: Not on file    Family History  Problem Relation Age of Onset   Diabetes Mother    Sleep apnea Mother    Emphysema Sister    CAD Neg Hx    Mental illness Neg Hx     Current Medications[2]  No results found.  No images are attached to the encounter.      Latest Ref Rng & Units 08/07/2024    9:29 AM  CMP  Glucose 70 - 99 mg/dL 847   BUN 6 - 20 mg/dL 12   Creatinine 9.38 - 1.24 mg/dL 8.48   Sodium 864 - 854 mmol/L 132    Potassium 3.5 - 5.1 mmol/L 3.5   Chloride 98 - 111 mmol/L  96   CO2 22 - 32 mmol/L 20   Calcium  8.9 - 10.3 mg/dL 9.5   Total Protein 6.5 - 8.1 g/dL 9.2   Total Bilirubin 0.0 - 1.2 mg/dL 0.7   Alkaline Phos 38 - 126 U/L 82   AST 15 - 41 U/L 26   ALT 0 - 44 U/L 9       Latest Ref Rng & Units 08/07/2024    9:29 AM  CBC  WBC 4.0 - 10.5 K/uL 11.7   Hemoglobin 13.0 - 17.0 g/dL 87.4   Hematocrit 60.9 - 52.0 % 37.6   Platelets 150 - 400 K/uL 304      Observation/objective:He is on home oxygen  and over video visit appears in no acute distress  Assessment and plan: Patient is a 59 year old malewith history of 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  He received 2 cycles of maintenance durvalumab  complicated by immunotherapy induced pneumonitis and this is a posthospital discharge follow-up visit  Patient was recently discharged from [facility] for durvalumab  induced pneumonitis and required IVIG and is presently on tapering course of steroids.  He is using about 6 to 7 L of oxygen  at rest.  We discussed his overall goals of care and he is not present accepting of a palliative/hospice based approach.  He would like to continue to keep an eye on his lung cancer with surveillance scans in the future.  He would never be a candidate for immunotherapy in the future.  If his performance status permits and there is evidence of disease progression he is willing to consider chemotherapy and therefore I would like him to keep his port in place for now.  Patient will get port labs CBC with differential CMP and CMP Fonda Borders sometime in mid March 2026.  I am planning to repeat his PET/CT scan sometime in mid April 2026 and see him 1 week after scans with labs  Follow-up instructions: As above   I discussed the assessment and treatment plan with the patient. The patient was provided an opportunity  to ask questions and all were answered. The patient agreed with the plan and demonstrated an understanding of the instructions.   The patient was advised to call back or seek an in-person evaluation if the symptoms worsen or if the condition fails to improve as anticipated.  I provided 35 minutes of face-to-face video visit time during this encounter, and > 50% was spent counseling as documented under my assessment & plan.  Visit Diagnosis: 1. Small cell lung cancer, left upper lobe (HCC)     Dr. Annah Skene, MD, MPH [facility] Tel- [phone] 09/16/2024 12:36 PM     [1]  Allergies Allergen Reactions   Hctz [Hydrochlorothiazide] Swelling   Ibuprofen Swelling  [2]  Current Outpatient Medications:    Accu-Chek Softclix Lancets lancets, Use with blood glucose monitor in the morning, at noon, and at bedtime. May substitute to any manufacturer covered by patient's insurance., Disp: 100 each, Rfl: 0   [Paused] amLODipine -olmesartan  (AZOR ) 10-40 MG tablet, Take 1 tablet by mouth daily., Disp: 90 tablet, Rfl: 1   aspirin  EC 81 MG tablet, Take 1 tablet (81 mg total) by mouth daily. Swallow whole., Disp: 30 tablet, Rfl: 3   Blood Glucose Monitoring Suppl (BLOOD GLUCOSE MONITOR SYSTEM) w/Device KIT, Check blood sugar in the morning, at noon, and at bedtime. May substitute to  any manufacturer covered by patient's insurance., Disp: 1 kit, Rfl: 0   Blood Pressure KIT, 1 each by Does not apply route 2 (two) times daily., Disp: 1 kit, Rfl: 0   empagliflozin  (JARDIANCE ) 10 MG TABS tablet, Take 1 tablet (10 mg total) by mouth daily before breakfast., Disp: 90 tablet, Rfl: 2   ipratropium-albuterol  (DUONEB) 0.5-2.5 (3) MG/3ML SOLN, Take 3 mLs by nebulization every 2 (two) hours as needed (wheeze, SOB). Can use instead of albuterol  inhaler, Disp: 60 mL, Rfl: 1   Nebulizers (COMPRESSOR/NEBULIZER) MISC, Nebulizer and tubes/mask to use as directed with DuoNeb as needed, Disp: 1  each, Rfl: 0   OXYGEN , Inhale 2 L into the lungs as needed., Disp: , Rfl:    Pancrelipase , Lip-Prot-Amyl, (ZENPEP ) 15000-47000 units CPEP, Take 1 capsule (15,000 Units total) by mouth 3 (three) times daily., Disp: 270 capsule, Rfl: 1   pantoprazole  (PROTONIX ) 20 MG tablet, Take 20 mg by mouth daily., Disp: , Rfl:    pravastatin  (PRAVACHOL ) 20 MG tablet, Take 1 tablet (20 mg total) by mouth daily at 6 PM., Disp: 90 tablet, Rfl: 2   sertraline  (ZOLOFT ) 100 MG tablet, TAKE 1 TABLET BY MOUTH ONCE DAILY, Disp: 90 tablet, Rfl: 0   spironolactone  (ALDACTONE ) 25 MG tablet, Take 1 tablet (25 mg total) by mouth daily., Disp: 90 tablet, Rfl: 3   traZODone  (DESYREL ) 50 MG tablet, Take 1 tablet (50 mg total) by mouth at bedtime. (Patient taking differently: Take 50 mg by mouth at bedtime as needed.), Disp: 30 tablet, Rfl: 3   VENTOLIN  HFA 108 (90 Base) MCG/ACT inhaler, Inhale 1-2 puffs into the lungs every 6 (six) hours as needed for wheezing or shortness of breath (or coughing)., Disp: 18 g, Rfl: 6   amoxicillin -clavulanate (AUGMENTIN ) 875-125 MG tablet, Take 1 tablet by mouth 2 (two) times daily. (Patient not taking: Reported on 09/14/2024), Disp: 14 tablet, Rfl: 0   furosemide  (LASIX ) 20 MG tablet, Take 1 tablet (20 mg total) by mouth daily., Disp: 90 tablet, Rfl: 1   omeprazole  (PRILOSEC) 20 MG capsule, Take 1 capsule (20 mg total) by mouth daily. (Patient not taking: Reported on 09/14/2024), Disp: 90 capsule, Rfl: 1

## 2024-09-20 ENCOUNTER — Telehealth: Payer: Self-pay

## 2024-09-20 NOTE — Telephone Encounter (Signed)
 I have not filled this out because the patient is requesting to be placed in a skilled nursing facility.  He has an appointment tomorrow to discuss this and fill out an FL 2 form

## 2024-09-20 NOTE — Progress Notes (Unsigned)
 "  Subjective:    Patient ID: Noah Velasquez, male    DOB: Jul 14, 1966, 59 y.o.   MRN: [id number]  HPI   Review of Systems     Past Medical History:  Diagnosis Date   Alcohol  use disorder    Aortic atherosclerosis    Bronchiolo-alveolar adenocarcinoma of left lung (HCC)    CHF (congestive heart failure) (HCC)    Chronic hip pain    Chronic pancreatitis (HCC)    Cigarette smoker    CKD (chronic kidney disease) stage 3, GFR 30-59 ml/min (HCC)    COPD (chronic obstructive pulmonary disease) (HCC)    Crack cocaine use    as of 01-21-24, pt states he last used a few weeks ago   Depression    DM (diabetes mellitus), type 2 (HCC)    Dyspnea    ED (erectile dysfunction)    ESBL (extended spectrum beta-lactamase) producing bacteria infection 2021   GERD (gastroesophageal reflux disease)    Hyperlipidemia    Hypertension    ILD (interstitial lung disease) (HCC)    Marijuana use    Osteoarthritis    Pneumonia    PTSD (post-traumatic stress disorder)    Seizure (HCC)    x1 years ago   Viral infection of left eye     Current Outpatient Medications  Medication Sig Dispense Refill   Accu-Chek Softclix Lancets lancets Use with blood glucose monitor in the morning, at noon, and at bedtime. May substitute to any manufacturer covered by patient's insurance. 100 each 0   [Paused] amLODipine -olmesartan  (AZOR ) 10-40 MG tablet Take 1 tablet by mouth daily. 90 tablet 1   amoxicillin -clavulanate (AUGMENTIN ) 875-125 MG tablet Take 1 tablet by mouth 2 (two) times daily. (Patient not taking: Reported on 09/14/2024) 14 tablet 0   aspirin  EC 81 MG tablet Take 1 tablet (81 mg total) by mouth daily. Swallow whole. 30 tablet 3   Blood Glucose Monitoring Suppl (BLOOD GLUCOSE MONITOR SYSTEM) w/Device KIT Check blood sugar in the morning, at noon, and at bedtime. May substitute to any manufacturer covered by patient's insurance. 1 kit 0   Blood Pressure KIT 1 each by Does not apply route 2 (two) times  daily. 1 kit 0   empagliflozin  (JARDIANCE ) 10 MG TABS tablet Take 1 tablet (10 mg total) by mouth daily before breakfast. 90 tablet 2   furosemide  (LASIX ) 20 MG tablet Take 1 tablet (20 mg total) by mouth daily. 90 tablet 1   ipratropium-albuterol  (DUONEB) 0.5-2.5 (3) MG/3ML SOLN Take 3 mLs by nebulization every 2 (two) hours as needed (wheeze, SOB). Can use instead of albuterol  inhaler 60 mL 1   Nebulizers (COMPRESSOR/NEBULIZER) MISC Nebulizer and tubes/mask to use as directed with DuoNeb as needed 1 each 0   omeprazole  (PRILOSEC) 20 MG capsule Take 1 capsule (20 mg total) by mouth daily. (Patient not taking: Reported on 09/14/2024) 90 capsule 1   OXYGEN  Inhale 2 L into the lungs as needed.     Pancrelipase , Lip-Prot-Amyl, (ZENPEP ) 15000-47000 units CPEP Take 1 capsule (15,000 Units total) by mouth 3 (three) times daily. 270 capsule 1   pantoprazole  (PROTONIX ) 20 MG tablet Take 20 mg by mouth daily.     pravastatin  (PRAVACHOL ) 20 MG tablet Take 1 tablet (20 mg total) by mouth daily at 6 PM. 90 tablet 2   sertraline  (ZOLOFT ) 100 MG tablet TAKE 1 TABLET BY MOUTH ONCE DAILY 90 tablet 0   spironolactone  (ALDACTONE ) 25 MG tablet Take 1 tablet (25 mg total) by mouth  daily. 90 tablet 3   traZODone  (DESYREL ) 50 MG tablet Take 1 tablet (50 mg total) by mouth at bedtime. (Patient taking differently: Take 50 mg by mouth at bedtime as needed.) 30 tablet 3   VENTOLIN  HFA 108 (90 Base) MCG/ACT inhaler Inhale 1-2 puffs into the lungs every 6 (six) hours as needed for wheezing or shortness of breath (or coughing). 18 g 6   No current facility-administered medications for this visit.    Allergies  Allergen Reactions   Hctz [Hydrochlorothiazide] Swelling   Ibuprofen Swelling    Family History  Problem Relation Age of Onset   Diabetes Mother    Sleep apnea Mother    Emphysema Sister    CAD Neg Hx    Mental illness Neg Hx     Social History   Socioeconomic History   Marital status: Legally Separated     Spouse name: Not on file   Number of children: 2   Years of education: Not on file   Highest education level: High school graduate  Occupational History   Occupation: on disability  Tobacco Use   Smoking status: Former    Current packs/day: 3.00    Average packs/day: 3.0 packs/day for 15.0 years (45.0 ttl pk-yrs)    Types: Cigarettes   Smokeless tobacco: Never   Tobacco comments:    At his heaviest smoked 3ppd and now occasionally smoke 1 cigarette. 02/01/24  Vaping Use   Vaping status: Some Days  Substance and Sexual Activity   Alcohol  use: Yes    Alcohol /week: 7.0 standard drinks of alcohol     Types: 7 Cans of beer per week    Comment: occ   Drug use: Yes    Types: Marijuana, Crack cocaine    Comment: Marijuana every day. 7-52yrs clean of crack cocaine.   Sexual activity: Yes    Birth control/protection: Condom  Other Topics Concern   Not on file  Social History Narrative   Not on file   Social Drivers of Health   Tobacco Use: Medium Risk (09/14/2024)   Patient History    Smoking Tobacco Use: Former    Smokeless Tobacco Use: Never    Passive Exposure: Not on file  Financial Resource Strain: Low Risk (08/20/2024)   Received from [facility]   Overall Financial Resource Strain (CARDIA)    How hard is it for you to pay for the very basics like food, housing, medical care, and heating?: Not hard at all  Food Insecurity: No Food Insecurity (08/20/2024)   Received [facility]   Epic    Within the past 12 months, you worried that your food would run out before you got the money to buy more.: Never true    Within the past 12 months, the food you bought just didn't last and you didn't have money to get more.: Never true  Transportation Needs: No Transportation Needs (08/20/2024)   Received from [facility]   PRAPARE - Transportation    Lack of Transportation (Medical): No    Lack of Transportation (Non-Medical): No  Physical Activity: Not on file  Stress:  Not on file  Social Connections: Not on file  Intimate Partner Violence: Not At Risk (07/24/2024)   Epic    Fear of Current or Ex-Partner: No    Emotionally Abused: No    Physically Abused: No    Sexually Abused: No  Depression (PHQ2-9): Low Risk (09/14/2024)   Depression (PHQ2-9)    PHQ-2 Score: 0  Alcohol  Screen: Low Risk (04/07/2023)   Alcohol  Screen    Last Alcohol  Screening Score (AUDIT): 4  Housing: Low Risk (07/24/2024)   Epic    Unable to Pay for Housing in the Last Year: No    Number of Times Moved in the Last Year: 0    Homeless in the Last Year: No  Utilities: Low Risk (08/20/2024)   Received from [facility]   Utilities    Within the past 12 months, have you been unable to get utilities(heat, electricity) when it was really needed?: No  Health Literacy: Not on file     Constitutional: Denies fever, malaise, fatigue, headache or abrupt weight changes.  HEENT: Pt reports dry eyes, eye pressure. Denies eye pain, eye redness, ear pain, ringing in the ears, wax buildup, runny nose, nasal congestion, bloody nose, or sore throat. Respiratory: Patient reports chronic cough and shortness of breath.  Denies difficulty breathing, or sputum production.   Cardiovascular: Denies chest pain, chest tightness, palpitations or swelling in the hands or feet.  Gastrointestinal: Pt reports intermittent diarrhea. Denies abdominal pain, bloating, constipation, or blood in the stool.  GU: Patient reports erectile dysfunction.  Denies urgency, frequency, pain with urination, burning sensation, blood in urine, odor or discharge. Musculoskeletal: Patient reports joint pain, difficulty with gait.  Denies decrease in range of motion, muscle pain or joint swelling.  Skin: Denies redness, rashes, lesions or ulcercations.  Neurological: Patient reports insomnia, neuropathy.  Denies dizziness, difficulty with memory, difficulty with speech or problems with balance and coordination.  Psych: Patient has a  history of anxiety and depression.  Denies SI/HI.  No other specific complaints in a complete review of systems (except as listed in HPI above).  Objective:   Physical Exam   There were no vitals taken for this visit.   Wt Readings from Last 3 Encounters:  08/07/24 155 lb 9.6 oz (70.6 kg)  07/29/24 164 lb 3.9 oz (74.5 kg)  07/14/24 155 lb 6.8 oz (70.5 kg)    General: Appears his stated age, overweight, chronically ill appearing, in NAD. Skin: Warm, dry and intact.  No ulcerations noted. HEENT: Head: normal shape and size; Eyes: sclera white, no icterus, conjunctiva pink, PERRLA and EOMs intact, exophthalmus noted;  Cardiovascular: Normal rate and rhythm. S1,S2 noted.  No murmur, rubs or gallops noted. No JVD or BLE edema. No carotid bruits noted. Pulmonary/Chest: Normal effort and diminished breath sounds. No respiratory distress. No wheezes, rales or ronchi noted.  Abdomen: Soft and nontender. Normal bowel sounds.  Musculoskeletal: In wheelchair. Neurological: Alert and oriented. Coordination normal.  Decree sensation of bilateral feet. Psychiatric: Mood and affect flat.  Behavior is normal. Judgment and thought content normal.     BMET    Component Value Date/Time   NA 132 (L) 08/07/2024 0929   K 3.5 08/07/2024 0929   CL 96 (L) 08/07/2024 0929   CO2 20 (L) 08/07/2024 0929   GLUCOSE 152 (H) 08/07/2024 0929   BUN 12 08/07/2024 0929   CREATININE 1.51 (H) 08/07/2024 0929   CREATININE 1.03 12/07/2023 1334   CALCIUM  9.5 08/07/2024 0929   GFRNONAA 53 (L) 08/07/2024 0929   GFRNONAA 64 06/06/2020 1141   GFRAA 74 06/06/2020 1141    Lipid Panel     Component Value Date/Time   CHOL 115 06/06/2024 1341   TRIG 112 06/06/2024 1341   HDL 64 06/06/2024 1341   CHOLHDL 1.8 06/06/2024 1341   VLDL 6 08/01/2022 0342   LDLCALC 31 06/06/2024  1341    CBC    Component Value Date/Time   WBC 11.7 (H) 08/07/2024 0929   WBC 5.6 07/25/2024 0546   RBC 4.02 (L) 08/07/2024 0929   HGB  12.5 (L) 08/07/2024 0929   HCT 37.6 (L) 08/07/2024 0929   PLT 304 08/07/2024 0929   MCV 93.5 08/07/2024 0929   MCH 31.1 08/07/2024 0929   MCHC 33.2 08/07/2024 0929   RDW 14.4 08/07/2024 0929   LYMPHSABS 1.2 08/07/2024 0929   MONOABS 0.5 08/07/2024 0929   EOSABS 0.3 08/07/2024 0929   BASOSABS 0.1 08/07/2024 0929    Hgb A1C Lab Results  Component Value Date   HGBA1C 7.1 (H) 06/06/2024           Assessment & Plan:     RTC in 2 months for your annual exam Angeline Laura, NP  "

## 2024-09-20 NOTE — Telephone Encounter (Signed)
 Copied from CRM [id number]. Topic: General - Other >> Sep 19, 2024  4:02 PM Myrick T wrote: Reason for CRM: Doyal Ellen from [facility] [phone] called to f/u on the [facility] 3051 form that was faxed on 1/26 @12 :43pm and 1/29 @ 3:04pm but not recvd. Please f/u with Doyal to see if there is an alternate route for them to get the forms to the office as patient has an appt on 2/11 otherwise they will have to reschedule him.

## 2024-09-21 ENCOUNTER — Ambulatory Visit: Admitting: Internal Medicine

## 2024-10-03 ENCOUNTER — Ambulatory Visit: Admitting: Internal Medicine

## 2024-11-01 ENCOUNTER — Inpatient Hospital Stay: Admitting: Hospice and Palliative Medicine

## 2024-11-01 ENCOUNTER — Inpatient Hospital Stay

## 2024-11-29 ENCOUNTER — Other Ambulatory Visit

## 2024-12-08 ENCOUNTER — Encounter: Admitting: Internal Medicine

## 2024-12-13 ENCOUNTER — Inpatient Hospital Stay: Admitting: Oncology

## 2024-12-13 ENCOUNTER — Inpatient Hospital Stay
